# Patient Record
Sex: Male | Born: 1950
Health system: Southern US, Community
[De-identification: ages and names within clinical notes are randomized; demographics above are authoritative.]

## PROBLEM LIST (undated history)

## (undated) DIAGNOSIS — J439 Emphysema, unspecified: Secondary | ICD-10-CM

## (undated) DIAGNOSIS — I110 Hypertensive heart disease with heart failure: Secondary | ICD-10-CM

## (undated) DIAGNOSIS — J189 Pneumonia, unspecified organism: Secondary | ICD-10-CM

## (undated) DIAGNOSIS — J383 Other diseases of vocal cords: Secondary | ICD-10-CM

## (undated) DIAGNOSIS — I639 Cerebral infarction, unspecified: Secondary | ICD-10-CM

## (undated) DIAGNOSIS — M1990 Unspecified osteoarthritis, unspecified site: Secondary | ICD-10-CM

## (undated) DIAGNOSIS — R0902 Hypoxemia: Secondary | ICD-10-CM

## (undated) DIAGNOSIS — C140 Malignant neoplasm of pharynx, unspecified: Secondary | ICD-10-CM

## (undated) DIAGNOSIS — G4730 Sleep apnea, unspecified: Secondary | ICD-10-CM

## (undated) DIAGNOSIS — C4359 Malignant melanoma of other part of trunk: Secondary | ICD-10-CM

## (undated) DIAGNOSIS — K219 Gastro-esophageal reflux disease without esophagitis: Secondary | ICD-10-CM

## (undated) DIAGNOSIS — E785 Hyperlipidemia, unspecified: Secondary | ICD-10-CM

## (undated) DIAGNOSIS — F329 Major depressive disorder, single episode, unspecified: Secondary | ICD-10-CM

## (undated) DIAGNOSIS — I739 Peripheral vascular disease, unspecified: Secondary | ICD-10-CM

## (undated) DIAGNOSIS — I509 Heart failure, unspecified: Secondary | ICD-10-CM

## (undated) DIAGNOSIS — C4491 Basal cell carcinoma of skin, unspecified: Secondary | ICD-10-CM

## (undated) DIAGNOSIS — T7840XA Allergy, unspecified, initial encounter: Secondary | ICD-10-CM

## (undated) DIAGNOSIS — N189 Chronic kidney disease, unspecified: Secondary | ICD-10-CM

## (undated) DIAGNOSIS — I2510 Atherosclerotic heart disease of native coronary artery without angina pectoris: Secondary | ICD-10-CM

## (undated) DIAGNOSIS — Z973 Presence of spectacles and contact lenses: Secondary | ICD-10-CM

## (undated) DIAGNOSIS — F32A Depression, unspecified: Secondary | ICD-10-CM

## (undated) DIAGNOSIS — I219 Acute myocardial infarction, unspecified: Secondary | ICD-10-CM

## (undated) DIAGNOSIS — C801 Malignant (primary) neoplasm, unspecified: Secondary | ICD-10-CM

## (undated) DIAGNOSIS — I10 Essential (primary) hypertension: Secondary | ICD-10-CM

## (undated) DIAGNOSIS — J449 Chronic obstructive pulmonary disease, unspecified: Secondary | ICD-10-CM

## (undated) HISTORY — DX: Allergy, unspecified, initial encounter: T78.40XA

## (undated) HISTORY — DX: Hyperlipidemia, unspecified: E78.5

## (undated) HISTORY — PX: CHOLECYSTECTOMY: SHX55

## (undated) HISTORY — PX: BACK SURGERY: SHX140

## (undated) HISTORY — DX: Hypoxemia: R09.02

## (undated) HISTORY — DX: Hypertensive heart disease with heart failure: I11.0

## (undated) HISTORY — PX: CARDIAC CATHETERIZATION: SHX172

## (undated) HISTORY — DX: Depression, unspecified: F32.A

## (undated) HISTORY — DX: Malignant neoplasm of pharynx, unspecified: C14.0

## (undated) HISTORY — PX: APPENDECTOMY: SHX54

## (undated) HISTORY — DX: Cerebral infarction, unspecified: I63.9

## (undated) HISTORY — PX: ESOPHAGOGASTRODUODENOSCOPY: SHX1529

## (undated) HISTORY — DX: Heart failure, unspecified: I50.9

## (undated) HISTORY — DX: Emphysema, unspecified: J43.9

## (undated) HISTORY — PX: COLONOSCOPY: SHX174

## (undated) HISTORY — PX: KNEE SURGERY: SHX244

## (undated) HISTORY — DX: Sleep apnea, unspecified: G47.30

## (undated) HISTORY — DX: Essential (primary) hypertension: I10

## (undated) HISTORY — PX: OTHER SURGICAL HISTORY: SHX169

## (undated) HISTORY — DX: Chronic kidney disease, unspecified: N18.9

## (undated) HISTORY — DX: Malignant melanoma of other part of trunk: C43.59

## (undated) HISTORY — DX: Malignant (primary) neoplasm, unspecified: C80.1

## (undated) HISTORY — PX: SHOULDER SURGERY: SHX246

## (undated) HISTORY — PX: ELBOW SURGERY: SHX618

---
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
## 1993-11-13 DIAGNOSIS — R69 Illness, unspecified: Secondary | ICD-10-CM | POA: Insufficient documentation

## 1993-11-13 HISTORY — DX: Illness, unspecified: R69

## 1995-11-14 HISTORY — PX: THROAT SURGERY: SHX803

## 1998-11-13 DIAGNOSIS — I219 Acute myocardial infarction, unspecified: Secondary | ICD-10-CM | POA: Insufficient documentation

## 1998-11-13 DIAGNOSIS — I259 Chronic ischemic heart disease, unspecified: Secondary | ICD-10-CM | POA: Insufficient documentation

## 1998-11-13 HISTORY — DX: Chronic ischemic heart disease, unspecified: I25.9

## 1998-11-13 HISTORY — DX: Acute myocardial infarction, unspecified: I21.9

## 1998-11-22 ENCOUNTER — Other Ambulatory Visit: Admission: RE | Admit: 1998-11-22 | Discharge: 1998-11-22 | Payer: Self-pay | Admitting: *Deleted

## 1999-06-29 ENCOUNTER — Encounter: Payer: Self-pay | Admitting: Internal Medicine

## 1999-06-29 ENCOUNTER — Inpatient Hospital Stay (HOSPITAL_COMMUNITY): Admission: EM | Admit: 1999-06-29 | Discharge: 1999-07-01 | Payer: Self-pay | Admitting: Internal Medicine

## 1999-07-06 ENCOUNTER — Inpatient Hospital Stay (HOSPITAL_COMMUNITY): Admission: EM | Admit: 1999-07-06 | Discharge: 1999-07-08 | Payer: Self-pay | Admitting: Cardiology

## 2007-05-31 ENCOUNTER — Inpatient Hospital Stay (HOSPITAL_COMMUNITY): Admission: RE | Admit: 2007-05-31 | Discharge: 2007-06-01 | Payer: Self-pay | Admitting: Neurosurgery

## 2009-04-06 ENCOUNTER — Inpatient Hospital Stay (HOSPITAL_COMMUNITY): Admission: RE | Admit: 2009-04-06 | Discharge: 2009-04-07 | Payer: Self-pay | Admitting: Neurosurgery

## 2011-02-21 LAB — CBC
HCT: 43.2 % (ref 39.0–52.0)
Hemoglobin: 15 g/dL (ref 13.0–17.0)
MCHC: 34.8 g/dL (ref 30.0–36.0)
MCV: 90.5 fL (ref 78.0–100.0)
Platelets: 195 10*3/uL (ref 150–400)
RBC: 4.77 MIL/uL (ref 4.22–5.81)
RDW: 12.8 % (ref 11.5–15.5)
WBC: 11.6 10*3/uL — ABNORMAL HIGH (ref 4.0–10.5)

## 2011-02-21 LAB — BASIC METABOLIC PANEL
BUN: 11 mg/dL (ref 6–23)
CO2: 31 mEq/L (ref 19–32)
Calcium: 8.8 mg/dL (ref 8.4–10.5)
Chloride: 104 mEq/L (ref 96–112)
Creatinine, Ser: 0.91 mg/dL (ref 0.4–1.5)
GFR calc Af Amer: >60 mL/min (ref >60)
GFR calc non Af Amer: >60 mL/min (ref >60)
Glucose, Bld: 136 mg/dL — ABNORMAL HIGH (ref 70–99)
Potassium: 4.1 mEq/L (ref 3.5–5.1)
Sodium: 139 mEq/L (ref 135–145)

## 2011-03-28 NOTE — Op Note (Signed)
NAMESCHYLAR, WUEBKER            ACCOUNT NO.:  [id number]   MEDICAL RECORD NO.:  [id number]          PATIENT TYPE:  INP   LOCATION:  3172                         FACILITY:  [facility]   PHYSICIAN:  Hilda Lias, M.D.   DATE OF BIRTH:  Aug 27, 1951   DATE OF PROCEDURE:  04/06/2009  DATE OF DISCHARGE:                               OPERATIVE REPORT   PREOPERATIVE DIAGNOSIS:  C3-C4 spondylosis with acute and chronic C4  radiculopathy right incidental C6-C7 herniated disk.   POSTOPERATIVE DIAGNOSIS:  C3-C4 spondylosis with acute and chronic C4  radiculopathy right incidental C6-C7 herniated disk.   PROCEDURE:  Anterior C3-C4 diskectomy and decompression of spinal cord,  decompression of the C4 nerve root bilaterally, interbody fusion with  autograft and allograft plate, microscope.   SURGEON:  Hilda Lias, MD.   ASSISTANT:  Cristi Loron, MD.   CLINICAL HISTORY:  Mr. Aversa is a 60 year old gentleman complaining of  neck pain worsened to the right upper extremity.  He had failed with  conservative treatment.  X-rays showed that he has a herniated disk and  an osteophyte at the level of 3-4 to the right side and incidental 6-7  to the left side.  Surgery was advised in view of no improvement.  The  patient declined epidural injection.   PROCEDURE:  The patient was taken to the OR and after intubation, the  left side of the neck was cleaned with DuraPrep.  Transverse incision  through the skin, platysma was carried out.  X-rays showed that indeed  we were at the level of 2-3.  From thereon, we dropped one level and we  opened the ligament at the level of C3-C4.  The patient had quite a bit  of degenerative disk disease and using the drill as well as the  microcurette, we did a total __________ diskectomy.  We brought the  microscope into the area.  We opened the posterior ligament, which was  half calcified and using the 2 and 3-mm Kerrison punch as well as the  drill, we went  laterally into the C3-4 area.  The patient has a  combination of a herniated disk plus osteophyte.  Decompression of the  C4 nerve root on the right side was done.  Then decompression of the  spinal cord as well as the left C4 was achieved.  The endplate were  drilled and a graft of 7 mm with autograft and PBX was inserted.  This  was followed by a plate using 4 screws.  Lateral cervical spine showed  good position of the graft and the plate.  Once we achieved the  hemostasis, the area was irrigated.  Then the wound was closed with  Vicryl and Steri-Strip.           ______________________________  Hilda Lias, M.D.     EB/MEDQ  D:  04/06/2009  T:  04/07/2009  Job:  [id number]

## 2011-03-28 NOTE — H&P (Signed)
Don [name], Don [name]            ACCOUNT NO.:  [id number]   MEDICAL RECORD NO.:  [id number]          PATIENT TYPE:  INP   LOCATION:  3003                         FACILITY:  [facility]   PHYSICIAN:  Hilda Lias, M.D.   DATE OF BIRTH:  06/05/1951   DATE OF ADMISSION:  04/06/2009  DATE OF DISCHARGE:                              HISTORY & PHYSICAL   [name] is a gentleman who came to my office with his wife  complaining of back pain with radiation to both shoulders which gets  worse with movement of the neck.  The patient feels that the pain is  getting worse.  He had epidural conservative treatment.  He declined  epidural injection.  The patient had MRI and he was sent to [place], and he  decided to go ahead with the surgery.   PAST MEDICAL HISTORY:  Knee surgery, throat surgery.   He is allergic to PENICILLIN.   The patient does not drink.  He smokes half a pack a day.   REVIEW OF SYSTEMS:  Positive for high blood pressure, high cholesterol,  emphysema, back pain, MI.   PHYSICAL EXAMINATION:  GENERAL:  The patient came to my office and he  was quite miserable.  He kept his neck in a neutral position.  EARS, NOSE, AND THROAT: Normal.  NECK:  He is able to flex, but extension and lateralization causes  discomfort going to the shoulder.  CARDIOVASCULAR:  Unremarkable.  LUNGS:  There is mild rhonchi bilaterally.  ABDOMEN:  Unremarkable.  EXTREMITIES:  Unremarkable.  NEUROLOGIC:  He has decreased flexibility of the lumbar spine.  Sensation seemed to be normal.  There is some tenderness in the  trapezius muscle.  There is no weakness in the upper extremities.   The MRI showed that he has a herniated disk at the level of 6-7 to the  left.  There is another one at the level of C3-4 and goes to the right  side compromising the C4 nerve root.   CLINICAL IMPRESSION:  1. C3-C4 stenosis and spondylosis with radiculopathy.  2. Incidental 6-7 herniated disk.   RECOMMENDATIONS:  The pain  team want to proceed with surgery.  The  procedure will be anterior cervical diskectomy at the level of 3-4.  He  knows about the risks such as infection, difficulty swallowing, CSF  leak.  He is fully aware.  Although he has no complaint of pain in the  left upper extremity or in the triceps, there is a possibility of down  the line, his C6 level will get to the point that he will require  different type of surgery.           ______________________________  Hilda Lias, M.D.     EB/MEDQ  D:  04/06/2009  T:  04/07/2009  Job:  [id number]

## 2011-03-28 NOTE — Op Note (Signed)
NAMEONEY, FOLZ            ACCOUNT NO.:  [id number]   MEDICAL RECORD NO.:  [id number]          PATIENT TYPE:  INP   LOCATION:  3172                         FACILITY:  [facility]   PHYSICIAN:  Hilda Lias, M.D.   DATE OF BIRTH:  December 11, 1950   DATE OF PROCEDURE:  05/31/2007  DATE OF DISCHARGE:                               OPERATIVE REPORT   PREOPERATIVE DIAGNOSIS:  Lumbar stenosis with claudication, L4-5.   POSTOPERATIVE DIAGNOSIS:  Lumbar stenosis with claudication, L4-5.   PROCEDURE:  Bilateral L4-5 laminectomy decompression of the L4-L5-S1  nerve roots bilaterally.   SURGEON:  Hilda Lias, M.D.   ASSISTANT:  Payton Doughty, M.D.   INDICATIONS FOR SURGERY:  The patient was admitted because of back pain  worsening to both legs.  X-rays showed that he has stenosis at the level  of L4-5 with a bulging disk at the level of L5-S1.  The patient has  failed conservative treatment and surgery was advised.  The patient  knows about the risks such infection, CSF leak, need for further  surgery, and consent was obtained.   DESCRIPTION OF PROCEDURE:  The patient was taken to the operating room  and after intubation the skin was prepped with DuraPrep.  Drapes were  applied.  Midline incision from L3-4 down to L5-S1 was made and muscles  were retracted laterally.  X-rays showed that we were at the spinous  process of L4.  Then with the Stille rongeur or we removed the spinous  process of L4 and L5.  Using the drill with the lamina bilaterally.  We  found that the patient had quite a bit of stenosis, mostly central.  The  midline yellow ligament was also removed and we proceed with a  decompression all the way laterally until we were able to decompress the  thecal sac.   Then using the 213 mm Kerrison punch we did decompression to decompress  the L4-L5-S1 nerve root.  At the end we have good decompression.  The  disk at the level of L5 was bulging but indeed there was no need to do  any type of diskectomy.  From then on the area was irrigated.  X-rays  showed that indeed we were at the level upper and lower part of L3-4,  and down to L5-S1.  Fentanyl and Depo-Medrol were left in the epidural  space; and the wound was closed we with Vicryl and Steri-Strips.           ______________________________  Hilda Lias, M.D.     EB/MEDQ  D:  05/31/2007  T:  05/31/2007  Job:  [id number]

## 2011-08-28 LAB — CBC
Hemoglobin: 15.2
MCHC: 35
Platelets: 241
RDW: 13.4

## 2011-08-28 LAB — PROTIME-INR
INR: 1
Prothrombin Time: 12.8

## 2011-08-28 LAB — COMPREHENSIVE METABOLIC PANEL
ALT: 20
Calcium: 8.5
Glucose, Bld: 115 — ABNORMAL HIGH
Sodium: 132 — ABNORMAL LOW
Total Protein: 6.7

## 2014-12-18 ENCOUNTER — Other Ambulatory Visit: Payer: Self-pay | Admitting: *Deleted

## 2014-12-18 DIAGNOSIS — M79605 Pain in left leg: Secondary | ICD-10-CM

## 2015-01-07 ENCOUNTER — Encounter: Payer: Self-pay | Admitting: Surgery

## 2015-01-08 ENCOUNTER — Ambulatory Visit (INDEPENDENT_AMBULATORY_CARE_PROVIDER_SITE_OTHER): Payer: Commercial Managed Care - HMO | Admitting: Surgery

## 2015-01-08 ENCOUNTER — Ambulatory Visit (HOSPITAL_COMMUNITY)
Admission: RE | Admit: 2015-01-08 | Discharge: 2015-01-08 | Disposition: A | Discharge status: Home / Self Care | Payer: Commercial Managed Care - HMO | Source: Ambulatory Visit | Attending: Surgery | Admitting: Surgery

## 2015-01-08 ENCOUNTER — Encounter: Payer: Self-pay | Admitting: Surgery

## 2015-01-08 ENCOUNTER — Other Ambulatory Visit: Payer: Self-pay

## 2015-01-08 VITALS — BP 134/80 | HR 64 | Resp 16 | Ht 71.0 in | Wt 193.0 lb

## 2015-01-08 DIAGNOSIS — F17200 Nicotine dependence, unspecified, uncomplicated: Secondary | ICD-10-CM | POA: Diagnosis not present

## 2015-01-08 DIAGNOSIS — E785 Hyperlipidemia, unspecified: Secondary | ICD-10-CM | POA: Diagnosis not present

## 2015-01-08 DIAGNOSIS — I10 Essential (primary) hypertension: Secondary | ICD-10-CM | POA: Insufficient documentation

## 2015-01-08 DIAGNOSIS — M79605 Pain in left leg: Secondary | ICD-10-CM | POA: Insufficient documentation

## 2015-01-08 DIAGNOSIS — I70219 Atherosclerosis of native arteries of extremities with intermittent claudication, unspecified extremity: Secondary | ICD-10-CM

## 2015-01-08 NOTE — Progress Notes (Signed)
Patient name: Don [name] MRN: [id number] DOB: 1951/03/17 Sex: male   Referred by: Dr. Laqueta Due  Reason for referral:  Chief Complaint  Patient presents with  . New Evaluation    decreased pulse in left foot, leg pain and coldness    HISTORY OF PRESENT ILLNESS: This is a 64 year old gentleman who comes in today for pain in his hip with walking.  The patient reports having had a left iliac stent in [place] 10 years ago.  He has had progressive difficulty walking, causing pain in his hip.  He can now go approximately 15-50 feet before he has to stop.  Rest improves his symptoms.  He also complains of his leg being cold.  The patient suffers from coronary artery disease.  He is status post MI which was treated with stents in 2000.  He now sees a cardiologist in [place], Dr. Bettina Gavia.  His hypercholesterolemia is treated with a statin.  He continues to smoke.  He does have a history of throat cancer  Past Medical History  Diagnosis Date  . Hyperlipidemia   . Hypertension   . Cancer     throat  . Melanoma of back     melanoma on back    Past Surgical History  Procedure Laterality Date  . Elbow surgery      bilateral  . Shoulder surgery    . Appendectomy    . Cholecystectomy    . Back surgery    . Throat surgery    . Knee surgery Left     History   Social History  . Marital Status: Married    Spouse Name: N/A  . Number of Children: N/A  . Years of Education: N/A   Occupational History  . Not on file.   Social History Main Topics  . Smoking status: Current Every Day Smoker -- 40 years    Types: Cigarettes  . Smokeless tobacco: Never Used  . Alcohol Use: No  . Drug Use: No  . Sexual Activity: Not on file   Other Topics Concern  . Not on file   Social History Narrative  . No narrative on file    Family History  Problem Relation Age of Onset  . Cancer Mother     bone  . Stroke Father     Allergies as of 01/08/2015 - Review Complete 01/08/2015    Allergen Reaction Noted  . Penicillins Rash 01/08/2015    No current outpatient prescriptions on file prior to visit.   No current facility-administered medications on file prior to visit.     REVIEW OF SYSTEMS: Cardiovascular: Positive for pain in legs on walking and lying flat.  History of DVT.  History of swelling.  Negative for chest pain Pulmonary: A 4 productive cough, no  asthma or wheezing. Neurologic: No weakness, paresthesias, aphasia, or amaurosis. No dizziness. Hematologic: No bleeding problems or clotting disorders. Musculoskeletal: No joint pain or joint swelling. Gastrointestinal: No blood in stool or hematemesis Genitourinary: No dysuria or hematuria. Psychiatric:: No history of major depression. Integumentary: No rashes or ulcers. Constitutional: No fever or chills.  PHYSICAL EXAMINATION: General: The patient appears their stated age.  Vital signs are BP 134/80 mmHg  Pulse 64  Resp 16  Ht 5\' 11"  (1.803 m)  Wt 193 lb (87.544 kg)  BMI 26.93 kg/m2 HEENT:  No gross abnormalities Pulmonary: Respirations are non-labored Abdomen: Soft and non-tender aorta is difficult to palpate  Musculoskeletal: There are no major deformities.  Neurologic: No focal weakness or paresthesias are detected, Skin: There are no ulcer or rashes noted. Psychiatric: The patient has normal affect. Cardiovascular: There is a regular rate and rhythm without significant murmur appreciated.  Palpable right dorsalis pedis pulse.  Nonpalpable left.  Palpable right femoral pulse, nonpalpable left femoral pulse.  No carotid bruits  Diagnostic Studies: ABIs were performed today.  1.15 with triphasic waveforms on the right.  0.67 on the left with monophasic waveforms    Assessment:  Atherosclerosis with claudication, left leg Plan: The patient's symptoms and vascular lab studies suggest that he has significant in-stent stenosis versus occlusion within his iliac stent which was placed 10 years  ago in [place].  The next step is to proceed with angiography via a right femoral approach.  I discussed proceeding with intervention if feasible.  He also understands that he may need surgery should his stent be occluded.  Risks and benefits were discussed with the patient and his wife and he wishes to proceed.  This is been scheduled for Tuesday, March 8.     Eldridge Abrahams, M.D. [facility] Office: [phone] Pager:  [phone]

## 2015-01-12 ENCOUNTER — Telehealth: Payer: Self-pay

## 2015-01-12 MED ORDER — SODIUM CHLORIDE 0.9 % IV SOLN
INTRAVENOUS | Status: DC
  Administered 2015-01-13: 11:00:00 via INTRAVENOUS

## 2015-01-12 NOTE — Telephone Encounter (Signed)
rec'd phone call from pt's wife.  Reported pt's symptoms have worsened since he saw Dr. Trula Slade on 2/26.  Reported the pt. is only able to walk a short distance, before the pain in his left leg starts.  Also said pt. Reported the pain doesn't ease up as quickly with stopping and resting.  Reported he also c/o pain at rest.  Describes pain as a "burning" sensation.  Notified Dr. Trula Slade of pt's worsening symptoms.  Recommended to move his Aortogram to 01/13/15.  Notified pt's wife of plan; agrees.  Instructed to have pt. report to the hospital at 10:00 AM on 3/2 ; scheduled for procedure at 12:00 PM.  Verb. Understanding.

## 2015-01-13 ENCOUNTER — Other Ambulatory Visit: Payer: Self-pay | Admitting: *Deleted

## 2015-01-13 ENCOUNTER — Encounter (HOSPITAL_COMMUNITY)
Admission: RE | Disposition: A | Discharge status: Home / Self Care | Payer: Self-pay | Source: Ambulatory Visit | Attending: Surgery

## 2015-01-13 ENCOUNTER — Encounter (HOSPITAL_COMMUNITY): Payer: Self-pay | Admitting: Vascular Surgery

## 2015-01-13 ENCOUNTER — Ambulatory Visit (HOSPITAL_COMMUNITY)
Admission: RE | Admit: 2015-01-13 | Discharge: 2015-01-13 | Disposition: A | Discharge status: Home / Self Care | Payer: Commercial Managed Care - HMO | Source: Ambulatory Visit | Attending: Surgery | Admitting: Surgery

## 2015-01-13 DIAGNOSIS — I252 Old myocardial infarction: Secondary | ICD-10-CM | POA: Insufficient documentation

## 2015-01-13 DIAGNOSIS — E780 Pure hypercholesterolemia: Secondary | ICD-10-CM | POA: Insufficient documentation

## 2015-01-13 DIAGNOSIS — E785 Hyperlipidemia, unspecified: Secondary | ICD-10-CM | POA: Insufficient documentation

## 2015-01-13 DIAGNOSIS — Z955 Presence of coronary angioplasty implant and graft: Secondary | ICD-10-CM | POA: Insufficient documentation

## 2015-01-13 DIAGNOSIS — I2510 Atherosclerotic heart disease of native coronary artery without angina pectoris: Secondary | ICD-10-CM | POA: Diagnosis not present

## 2015-01-13 DIAGNOSIS — F17210 Nicotine dependence, cigarettes, uncomplicated: Secondary | ICD-10-CM | POA: Diagnosis not present

## 2015-01-13 DIAGNOSIS — I739 Peripheral vascular disease, unspecified: Secondary | ICD-10-CM | POA: Diagnosis present

## 2015-01-13 DIAGNOSIS — I70212 Atherosclerosis of native arteries of extremities with intermittent claudication, left leg: Secondary | ICD-10-CM | POA: Diagnosis not present

## 2015-01-13 DIAGNOSIS — I10 Essential (primary) hypertension: Secondary | ICD-10-CM | POA: Insufficient documentation

## 2015-01-13 DIAGNOSIS — Z85818 Personal history of malignant neoplasm of other sites of lip, oral cavity, and pharynx: Secondary | ICD-10-CM | POA: Diagnosis not present

## 2015-01-13 DIAGNOSIS — Z9889 Other specified postprocedural states: Secondary | ICD-10-CM

## 2015-01-13 DIAGNOSIS — Z85820 Personal history of malignant melanoma of skin: Secondary | ICD-10-CM | POA: Diagnosis not present

## 2015-01-13 DIAGNOSIS — Z79899 Other long term (current) drug therapy: Secondary | ICD-10-CM | POA: Insufficient documentation

## 2015-01-13 HISTORY — PX: ABDOMINAL ANGIOGRAM: SHX5499

## 2015-01-13 LAB — POCT I-STAT, CHEM 8
BUN: 20 mg/dL (ref 6–23)
CHLORIDE: 102 mmol/L (ref 96–112)
CREATININE: 1 mg/dL (ref 0.50–1.35)
Calcium, Ion: 1.23 mmol/L (ref 1.13–1.30)
GLUCOSE: 122 mg/dL — AB (ref 70–99)
HEMATOCRIT: 48 % (ref 39.0–52.0)
Hemoglobin: 16.3 g/dL (ref 13.0–17.0)
POTASSIUM: 4.4 mmol/L (ref 3.5–5.1)
SODIUM: 142 mmol/L (ref 135–145)
TCO2: 26 mmol/L (ref 0–100)

## 2015-01-13 LAB — POCT ACTIVATED CLOTTING TIME
Activated Clotting Time: 202 seconds
Activated Clotting Time: 171 seconds

## 2015-01-13 SURGERY — ABDOMINAL ANGIOGRAM
Anesthesia: LOCAL

## 2015-01-13 MED ORDER — CLOPIDOGREL BISULFATE 75 MG PO TABS: 75.0000 mg | ORAL_TABLET | Freq: Every day | ORAL | Status: DC

## 2015-01-13 MED ORDER — SODIUM CHLORIDE 0.9 % IV SOLN: 1.0000 mL/kg/h | INTRAVENOUS | Status: DC

## 2015-01-13 MED ORDER — HEPARIN (PORCINE) IN NACL 2-0.9 UNIT/ML-% IJ SOLN
INTRAMUSCULAR | Status: AC
  Filled 2015-01-13: qty 1000

## 2015-01-13 MED ORDER — CLOPIDOGREL BISULFATE 75 MG PO TABS
ORAL_TABLET | ORAL | Status: AC
  Administered 2015-01-13: 75 mg via ORAL
  Filled 2015-01-13: qty 1

## 2015-01-13 MED ORDER — FENTANYL CITRATE 0.05 MG/ML IJ SOLN
INTRAMUSCULAR | Status: AC
  Filled 2015-01-13: qty 2

## 2015-01-13 MED ORDER — OXYCODONE-ACETAMINOPHEN 5-325 MG PO TABS: 1.0000 | ORAL_TABLET | ORAL | Status: DC | PRN

## 2015-01-13 MED ORDER — MIDAZOLAM HCL 2 MG/2ML IJ SOLN
INTRAMUSCULAR | Status: AC
  Filled 2015-01-13: qty 2

## 2015-01-13 MED ORDER — CLOPIDOGREL BISULFATE 75 MG PO TABS
75.0000 mg | ORAL_TABLET | Freq: Once | ORAL | Status: AC
  Administered 2015-01-13: 75 mg via ORAL

## 2015-01-13 MED ORDER — LIDOCAINE HCL (PF) 1 % IJ SOLN
INTRAMUSCULAR | Status: AC
Start: 2015-01-13 — End: 2015-01-13
  Filled 2015-01-13: qty 30

## 2015-01-13 NOTE — Progress Notes (Signed)
Site area: rt groin Site Prior to Removal:  Level 0 Pressure Applied For: 20 minutes Manual:   yes Patient Status During Pull:  yes Post Pull Site:  Level 0 Post Pull Instructions Given:  yes Post Pull Pulses Present: yes Dressing Applied:  tegaderm Bedrest begins @  1510 Comments: 0 complications

## 2015-01-13 NOTE — Op Note (Signed)
    OPERATIVE REPORT  DATE OF SURGERY: 01/13/2015  PATIENT: Don [name], 64 y.o. male MRN: [id number]  DOB: 1951-02-27  PRE-OPERATIVE DIAGNOSIS: Left leg claudication  POST-OPERATIVE DIAGNOSIS:  Same  PROCEDURE:1.Aortogram with bilateral lower extremity runoff, 2. Left external iliac angioplasty and 7 x 30 Abbott  absolutes self-expanding stent  SURGEON:  Curt Jews, M.D.  PHYSICIAN ASSISTANT: Nurse  ANESTHESIA:  1% lidocaine local with IV sedation  EBL: Minimal ml     BLOOD ADMINISTERED: None  DRAINS: None  SPECIMEN: None  COUNTS CORRECT:  YES  PLAN OF CARE: PACU   PATIENT DISPOSITION:  PACU - hemodynamically stable  PROCEDURE DETAILS: Patient was taken for this Cath Lab placed supine position where the area of the left groins prepped draped in sterile fashion. Using local anesthesia and SonoSite visualization with singlewall puncture the right common femoral artery was entered and a guidewire was passed centrally and this was confirmed with fluoroscopy. A 5 French sheath was passed over the guidewire and a pigtail catheter was positioned above the level renal arteries. AP projections undertaken showing the aorta pelvis. Catheter was then withdrawn down to the just above the aortic bifurcation and long-leg runoff was obtained.  The patient had single right and 2 left renal arteries with no significant stenosis. There was diffuse irregularity throughout the aortoiliac segments. The patient had a prior placed left common iliac artery stent that was from just below the bifurcation probably extending into the external iliac artery proximally. The internal iliac artery was occluded on the left was patent on the right. Long-leg runoff revealed widely patent profunda and superficial femoral arteries bilaterally. Popliteal arteries and three-vessel runoff was normal bilaterally. The patient did have a high-grade stenosis which is quite focal just below the end of the prior placed  left iliac artery stent. Decision was made to intervene on this as the cause for his limiting claudication.  Patient was given 5000 units intravenous heparin. A KMP catheter was used to cross over the aortic bifurcation from the right and a guidewire passed into the prior placed stent. KMP catheter was positioned in the prior placed stent and the guidewire passed across this tight lesion. The 5 French sheath was removed and a long crossover sheath was placed over the guidewire and positioned in the prior placed iliac stent. Hand injection again revealed the location of the high-grade stenosis. A 7 x 30 Abbott absolute stent was brought onto the field and was passed over the guidewire. This was released at the level of the high-grade stenosis. The proximal portion of the stent overlapped the old Pre-existing stent. The stent was post dilatated with 7 x 2 balloon. The balloon was removed and repeat hand injection showed excellent result with no residual stenosis. The sheath was pulled back into the right external iliac artery. He was transferred to holding area where sheath was being pulled after ACT normalized.  #1 high-grade stenosis of external iliac artery on the left below prior placed left common iliac artery stent.  #2 widely patent superficial and deep femoral arteries bilaterally with Y patent popliteal and three-vessel runoff #3 successful self-expanding stent and angioplasty of left external iliac artery stenosis   Curt Jews, M.D. 01/13/2015 2:04 PM

## 2015-01-13 NOTE — H&P (View-Only) (Signed)
Patient name: Don [name] MRN: [id number] DOB: 05/16/1951 Sex: male   Referred by: Dr. Laqueta Due  Reason for referral:  Chief Complaint  Patient presents with  . New Evaluation    decreased pulse in left foot, leg pain and coldness    HISTORY OF PRESENT ILLNESS: This is a 64 year old gentleman who comes in today for pain in his hip with walking.  The patient reports having had a left iliac stent in [place] 10 years ago.  He has had progressive difficulty walking, causing pain in his hip.  He can now go approximately 15-50 feet before he has to stop.  Rest improves his symptoms.  He also complains of his leg being cold.  The patient suffers from coronary artery disease.  He is status post MI which was treated with stents in 2000.  He now sees a cardiologist in [place], Dr. Bettina Gavia.  His hypercholesterolemia is treated with a statin.  He continues to smoke.  He does have a history of throat cancer  Past Medical History  Diagnosis Date  . Hyperlipidemia   . Hypertension   . Cancer     throat  . Melanoma of back     melanoma on back    Past Surgical History  Procedure Laterality Date  . Elbow surgery      bilateral  . Shoulder surgery    . Appendectomy    . Cholecystectomy    . Back surgery    . Throat surgery    . Knee surgery Left     History   Social History  . Marital Status: Married    Spouse Name: N/A  . Number of Children: N/A  . Years of Education: N/A   Occupational History  . Not on file.   Social History Main Topics  . Smoking status: Current Every Day Smoker -- 40 years    Types: Cigarettes  . Smokeless tobacco: Never Used  . Alcohol Use: No  . Drug Use: No  . Sexual Activity: Not on file   Other Topics Concern  . Not on file   Social History Narrative  . No narrative on file    Family History  Problem Relation Age of Onset  . Cancer Mother     bone  . Stroke Father     Allergies as of 01/08/2015 - Review Complete 01/08/2015    Allergen Reaction Noted  . Penicillins Rash 01/08/2015    No current outpatient prescriptions on file prior to visit.   No current facility-administered medications on file prior to visit.     REVIEW OF SYSTEMS: Cardiovascular: Positive for pain in legs on walking and lying flat.  History of DVT.  History of swelling.  Negative for chest pain Pulmonary: A 4 productive cough, no  asthma or wheezing. Neurologic: No weakness, paresthesias, aphasia, or amaurosis. No dizziness. Hematologic: No bleeding problems or clotting disorders. Musculoskeletal: No joint pain or joint swelling. Gastrointestinal: No blood in stool or hematemesis Genitourinary: No dysuria or hematuria. Psychiatric:: No history of major depression. Integumentary: No rashes or ulcers. Constitutional: No fever or chills.  PHYSICAL EXAMINATION: General: The patient appears their stated age.  Vital signs are BP 134/80 mmHg  Pulse 64  Resp 16  Ht 5\' 11"  (1.803 m)  Wt 193 lb (87.544 kg)  BMI 26.93 kg/m2 HEENT:  No gross abnormalities Pulmonary: Respirations are non-labored Abdomen: Soft and non-tender aorta is difficult to palpate  Musculoskeletal: There are no major deformities.  Neurologic: No focal weakness or paresthesias are detected, Skin: There are no ulcer or rashes noted. Psychiatric: The patient has normal affect. Cardiovascular: There is a regular rate and rhythm without significant murmur appreciated.  Palpable right dorsalis pedis pulse.  Nonpalpable left.  Palpable right femoral pulse, nonpalpable left femoral pulse.  No carotid bruits  Diagnostic Studies: ABIs were performed today.  1.15 with triphasic waveforms on the right.  0.67 on the left with monophasic waveforms    Assessment:  Atherosclerosis with claudication, left leg Plan: The patient's symptoms and vascular lab studies suggest that he has significant in-stent stenosis versus occlusion within his iliac stent which was placed 10 years  ago in [place].  The next step is to proceed with angiography via a right femoral approach.  I discussed proceeding with intervention if feasible.  He also understands that he may need surgery should his stent be occluded.  Risks and benefits were discussed with the patient and his wife and he wishes to proceed.  This is been scheduled for Tuesday, March 8.     Eldridge Abrahams, M.D. [facility] Office: [phone] Pager:  [phone]

## 2015-01-13 NOTE — Interval H&P Note (Signed)
History and Physical Interval Note:  01/13/2015 12:57 PM  Don [name]  has presented today for surgery, with the diagnosis of pvd  The various methods of treatment have been discussed with the patient and family. After consideration of risks, benefits and other options for treatment, the patient has consented to  Procedure(s): ABDOMINAL ANGIOGRAM (N/A) as a surgical intervention .  The patient's history has been reviewed, patient examined, no change in status, stable for surgery.  I have reviewed the patient's chart and labs.  Questions were answered to the patient's satisfaction.     EARLY, TODD

## 2015-01-13 NOTE — Discharge Instructions (Signed)

## 2015-01-18 ENCOUNTER — Telehealth: Payer: Self-pay

## 2015-01-18 NOTE — Telephone Encounter (Signed)
Phone call to pt's wife in follow-up to call placed on 3/5, through the answering service, for c/o generalized rash.  Wife stated the pt. was started on a steroid and benadryl after speaking to Dr. Trula Slade on Saturday, 3/5.   Reported the pt. states the itching is somewhat better, but generalized rash still present.  Stated she will contact office if pt's symptoms don't continue to improve.  Will give Dr. Trula Slade update.

## 2015-01-19 ENCOUNTER — Telehealth: Payer: Self-pay | Admitting: Surgery

## 2015-01-19 NOTE — Telephone Encounter (Addendum)
-----   Message from Mena Goes, RN sent at 01/13/2015  2:34 PM EST ----- Regarding: Schedule 2 Patients on this message.   ----- Message -----    From: Rosetta Posner, MD    Sent: 01/13/2015   2:10 PM      To: [facility] Charge Pool  Had aortogram with bilateral motion runoff, this was with SonoSite the access, left external iliac artery angioplasty and stenting. Needs to see Dr. Trula Slade and office follow-up with ankle arm indices in one month  01/19/15: phone # is not valid, mailed letter, dpm

## 2015-02-12 DIAGNOSIS — J189 Pneumonia, unspecified organism: Secondary | ICD-10-CM

## 2015-02-12 HISTORY — DX: Pneumonia, unspecified organism: J18.9

## 2015-03-01 ENCOUNTER — Encounter (HOSPITAL_COMMUNITY): Payer: Commercial Managed Care - HMO

## 2015-03-01 ENCOUNTER — Encounter: Payer: Commercial Managed Care - HMO | Admitting: Surgery

## 2015-03-15 ENCOUNTER — Other Ambulatory Visit: Payer: Self-pay | Admitting: Otolaryngology

## 2015-03-15 ENCOUNTER — Encounter (HOSPITAL_COMMUNITY): Payer: Self-pay | Admitting: *Deleted

## 2015-03-15 NOTE — Progress Notes (Signed)
No pre-op orders in EPIC. Called Dr. Janace Hoard' office to request orders, spoke with [facility] and she states she will get them in.

## 2015-03-15 NOTE — Progress Notes (Signed)
Anesthesia Chart Review: SAME DAY WORK-UP.  Patient is a 64 year old male scheduled for microlaryngoscopy with laser tomorrow by Dr. Janace Hoard.  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  History includes smoking, CAD/MI s/p stents '00, HLD, HTN, PVD s/p left CIA stent ~ 2006 ([place]) and s/p angioplasty/stent left EIA 01/13/15, melanoma (back), throat cancer s/p surgery, lumbar laminectomy '08, C4-5 ACDF '10.   PCP is listed as Dr. Ernestene Kiel. Vascular surgeon is Dr. Trula Slade. Cardiologist is Dr. Shirlee More, in [place], last visit 06/24/14 for routine follow-up and preoperative clearance (surgery isn't listed but he was felt overall low CV risk for an "intermediate risk" procedure. PAT RN said wife reported it was surgery for Peyronie's disease.) Patient was able to exercise > 6 METS at that time. His not also indicates that patient has secondary cardiomyopathy with EF 45% and inferior hypokinesis '12.   Meds include Xanax, ASA, Plavix, Flexeril, Cymbalta, Flovent, Lopressor, Prilosec, Zocor, Desyrel. Per PAT RN, patient's wife reported he was told to hold ASA and Plavix for ~ 1 1/2 weeks prior to surgery. (Since he had a EIA stent placed 01/2015, I notified [facility] RN Colletta Maryland of patient's planned procedure and that Plavix/ASA on hold. Dr. Trula Slade felt it was okay to hold 5-7 days; however I notified her that patient has actually been off for ~ 10 days.)  Of note, he just completed a two week course of doxycycline for "pneumonia" diagnosed at [facility]. Patient told his PAT RN that he is afebrile with only his usual COPD symptoms/cough.   01/13/15 EKG: NSR, LAFB, pulmonary disease pattern, incomplete RBBB. No significant change since tracing in [place] from 05/31/07.  06/24/14 Echo [facility]; done as part of his pre-operative assessment for a previous surgery):  1. Mild diffuse LV hypokinesis with borderline LVEF of 50%, no segmental abnormality. 2. Trace/mild MR, probably secondary to #1, mil  mild LA dilatation. 3. Grossly normal right heart size/function, with only trace TR. 4. Incidentally noted, frequent PVCs.   By notes, "2012 coronary arteriography showed chronic occlusion of the right coronary artery, mildly diminished ejection fraction, no other significant coronary stenosis. He has been treated medically."   Records from [facility] (cardiac cath) and [facility] (CXR) requested but are still pending.  Patient recently completed treatment for "pneumonia." Records are pending.  Reportedly, he is overall back to his baseline.  Since he is a same day work-up, I can not assess evaluate his lung sounds, so this will need to be done on arrival by his anesthesiologists.  Will also need to confirm that he has had no new CV symptoms.  His last cardiology office note was reassuring--EF was stable and good exercise tolerance.  If cardiopulmonary exam is felt stable by his anesthesiologist on the day of surgery then I anticipate that he can proceed as planned. If significant abnormal breaths sounds then it is possible that procedure would need to be delayed. Discussed with anesthesiologist Dr. Lissa Hoard who agrees with this plan. I updated Glenda at Dr. Peggyann Juba office. She is faxing a medical clearance note from Dr. Laqueta Due that was signed and dated today. Further evaluation on arrival.  George Hugh [facility] Short Stay Center/Anesthesiology Phone [phone] 03/15/2015 2:25 PM

## 2015-03-15 NOTE — Progress Notes (Signed)
Spoke with pt's wife, Anne Ng for pre-op call. She states pt has been treated for pneumonia the past 2 weeks. States he does not have a fever and his cough is his usual COPD cough.  She states pt has stopped his Plavix and Aspirin 1.5 weeks ago.   Called Dr. Joya Gaskins office to get copies of last OV, EKG and any other heart studies.   Requested EKG and cath report from [place] from [facility].

## 2015-03-16 ENCOUNTER — Ambulatory Visit (HOSPITAL_COMMUNITY): Payer: Commercial Managed Care - HMO | Admitting: Vascular Surgery

## 2015-03-16 ENCOUNTER — Ambulatory Visit (HOSPITAL_COMMUNITY)
Admission: RE | Admit: 2015-03-16 | Discharge: 2015-03-16 | Disposition: A | Discharge status: Home / Self Care | Payer: Commercial Managed Care - HMO | Source: Ambulatory Visit | Attending: Otolaryngology | Admitting: Otolaryngology

## 2015-03-16 ENCOUNTER — Encounter (HOSPITAL_COMMUNITY)
Admission: RE | Disposition: A | Discharge status: Home / Self Care | Payer: Self-pay | Source: Ambulatory Visit | Attending: Otolaryngology

## 2015-03-16 ENCOUNTER — Encounter (HOSPITAL_COMMUNITY): Payer: Self-pay | Admitting: *Deleted

## 2015-03-16 DIAGNOSIS — M1990 Unspecified osteoarthritis, unspecified site: Secondary | ICD-10-CM | POA: Insufficient documentation

## 2015-03-16 DIAGNOSIS — J383 Other diseases of vocal cords: Secondary | ICD-10-CM | POA: Insufficient documentation

## 2015-03-16 DIAGNOSIS — I739 Peripheral vascular disease, unspecified: Secondary | ICD-10-CM | POA: Diagnosis not present

## 2015-03-16 DIAGNOSIS — K219 Gastro-esophageal reflux disease without esophagitis: Secondary | ICD-10-CM | POA: Insufficient documentation

## 2015-03-16 DIAGNOSIS — Z8701 Personal history of pneumonia (recurrent): Secondary | ICD-10-CM | POA: Insufficient documentation

## 2015-03-16 DIAGNOSIS — I252 Old myocardial infarction: Secondary | ICD-10-CM | POA: Diagnosis not present

## 2015-03-16 DIAGNOSIS — Z79899 Other long term (current) drug therapy: Secondary | ICD-10-CM | POA: Diagnosis not present

## 2015-03-16 DIAGNOSIS — Z91041 Radiographic dye allergy status: Secondary | ICD-10-CM | POA: Diagnosis not present

## 2015-03-16 DIAGNOSIS — Z85818 Personal history of malignant neoplasm of other sites of lip, oral cavity, and pharynx: Secondary | ICD-10-CM | POA: Insufficient documentation

## 2015-03-16 DIAGNOSIS — Z880 Allergy status to penicillin: Secondary | ICD-10-CM | POA: Insufficient documentation

## 2015-03-16 DIAGNOSIS — F17210 Nicotine dependence, cigarettes, uncomplicated: Secondary | ICD-10-CM | POA: Diagnosis not present

## 2015-03-16 DIAGNOSIS — Z955 Presence of coronary angioplasty implant and graft: Secondary | ICD-10-CM | POA: Diagnosis not present

## 2015-03-16 DIAGNOSIS — I10 Essential (primary) hypertension: Secondary | ICD-10-CM | POA: Insufficient documentation

## 2015-03-16 DIAGNOSIS — E785 Hyperlipidemia, unspecified: Secondary | ICD-10-CM | POA: Diagnosis not present

## 2015-03-16 DIAGNOSIS — J449 Chronic obstructive pulmonary disease, unspecified: Secondary | ICD-10-CM | POA: Insufficient documentation

## 2015-03-16 DIAGNOSIS — Z9049 Acquired absence of other specified parts of digestive tract: Secondary | ICD-10-CM | POA: Insufficient documentation

## 2015-03-16 DIAGNOSIS — D141 Benign neoplasm of larynx: Secondary | ICD-10-CM | POA: Diagnosis not present

## 2015-03-16 DIAGNOSIS — Z7902 Long term (current) use of antithrombotics/antiplatelets: Secondary | ICD-10-CM | POA: Diagnosis not present

## 2015-03-16 DIAGNOSIS — Z85828 Personal history of other malignant neoplasm of skin: Secondary | ICD-10-CM | POA: Insufficient documentation

## 2015-03-16 DIAGNOSIS — Z7982 Long term (current) use of aspirin: Secondary | ICD-10-CM | POA: Diagnosis not present

## 2015-03-16 DIAGNOSIS — I2510 Atherosclerotic heart disease of native coronary artery without angina pectoris: Secondary | ICD-10-CM | POA: Diagnosis not present

## 2015-03-16 HISTORY — DX: Chronic obstructive pulmonary disease, unspecified: J44.9

## 2015-03-16 HISTORY — DX: Acute myocardial infarction, unspecified: I21.9

## 2015-03-16 HISTORY — DX: Peripheral vascular disease, unspecified: I73.9

## 2015-03-16 HISTORY — DX: Gastro-esophageal reflux disease without esophagitis: K21.9

## 2015-03-16 HISTORY — DX: Atherosclerotic heart disease of native coronary artery without angina pectoris: I25.10

## 2015-03-16 HISTORY — DX: Basal cell carcinoma of skin, unspecified: C44.91

## 2015-03-16 HISTORY — DX: Unspecified osteoarthritis, unspecified site: M19.90

## 2015-03-16 HISTORY — DX: Pneumonia, unspecified organism: J18.9

## 2015-03-16 HISTORY — PX: MICROLARYNGOSCOPY WITH LASER: SHX5972

## 2015-03-16 LAB — BASIC METABOLIC PANEL
Anion gap: 9 (ref 5–15)
BUN: 17 mg/dL (ref 6–20)
CALCIUM: 9 mg/dL (ref 8.9–10.3)
CO2: 28 mmol/L (ref 22–32)
Chloride: 102 mmol/L (ref 101–111)
Creatinine, Ser: 0.98 mg/dL (ref 0.61–1.24)
GFR calc Af Amer: >60 mL/min (ref >60)
GLUCOSE: 126 mg/dL — AB (ref 70–99)
Potassium: 4 mmol/L (ref 3.5–5.1)
Sodium: 139 mmol/L (ref 135–145)

## 2015-03-16 LAB — CBC
HCT: 41 % (ref 39.0–52.0)
HEMOGLOBIN: 14.1 g/dL (ref 13.0–17.0)
MCH: 30.7 pg (ref 26.0–34.0)
MCHC: 34.4 g/dL (ref 30.0–36.0)
MCV: 89.3 fL (ref 78.0–100.0)
Platelets: 174 10*3/uL (ref 150–400)
RBC: 4.59 MIL/uL (ref 4.22–5.81)
RDW: 13.2 % (ref 11.5–15.5)
WBC: 14.6 10*3/uL — ABNORMAL HIGH (ref 4.0–10.5)

## 2015-03-16 LAB — PROTIME-INR
INR: 1.05 (ref 0.00–1.49)
PROTHROMBIN TIME: 13.8 s (ref 11.6–15.2)

## 2015-03-16 SURGERY — MICROLARYNGOSCOPY, WITH PROCEDURE USING LASER
Anesthesia: General | Site: Mouth

## 2015-03-16 MED ORDER — ROCURONIUM BROMIDE 100 MG/10ML IV SOLN
INTRAVENOUS | Status: DC | PRN
  Administered 2015-03-16: 30 mg via INTRAVENOUS

## 2015-03-16 MED ORDER — OXYCODONE HCL 5 MG PO TABS: 5.0000 mg | ORAL_TABLET | Freq: Once | ORAL | Status: AC | PRN

## 2015-03-16 MED ORDER — ROCURONIUM BROMIDE 50 MG/5ML IV SOLN
INTRAVENOUS | Status: AC
  Filled 2015-03-16: qty 1

## 2015-03-16 MED ORDER — EPINEPHRINE HCL (NASAL) 0.1 % NA SOLN
NASAL | Status: DC | PRN
  Administered 2015-03-16: 30 mL via TOPICAL

## 2015-03-16 MED ORDER — EPINEPHRINE HCL (NASAL) 0.1 % NA SOLN
NASAL | Status: AC
  Filled 2015-03-16: qty 30

## 2015-03-16 MED ORDER — FENTANYL CITRATE (PF) 100 MCG/2ML IJ SOLN: 25.0000 ug | INTRAMUSCULAR | Status: DC | PRN

## 2015-03-16 MED ORDER — PROPOFOL 10 MG/ML IV BOLUS
INTRAVENOUS | Status: AC
  Filled 2015-03-16: qty 20

## 2015-03-16 MED ORDER — FENTANYL CITRATE (PF) 100 MCG/2ML IJ SOLN
INTRAMUSCULAR | Status: DC | PRN
  Administered 2015-03-16 (×2): 100 ug via INTRAVENOUS

## 2015-03-16 MED ORDER — 0.9 % SODIUM CHLORIDE (POUR BTL) OPTIME
TOPICAL | Status: DC | PRN
  Administered 2015-03-16: 1000 mL

## 2015-03-16 MED ORDER — DEXAMETHASONE SODIUM PHOSPHATE 4 MG/ML IJ SOLN
INTRAMUSCULAR | Status: DC | PRN
  Administered 2015-03-16: 4 mg via INTRAVENOUS

## 2015-03-16 MED ORDER — OXYCODONE HCL 5 MG/5ML PO SOLN: 5.0000 mg | Freq: Once | ORAL | Status: AC | PRN

## 2015-03-16 MED ORDER — ONDANSETRON HCL 4 MG/2ML IJ SOLN
INTRAMUSCULAR | Status: AC
  Filled 2015-03-16: qty 2

## 2015-03-16 MED ORDER — FENTANYL CITRATE (PF) 250 MCG/5ML IJ SOLN
INTRAMUSCULAR | Status: AC
  Filled 2015-03-16: qty 5

## 2015-03-16 MED ORDER — PROPOFOL 10 MG/ML IV BOLUS
INTRAVENOUS | Status: DC | PRN
  Administered 2015-03-16: 90 mg via INTRAVENOUS

## 2015-03-16 MED ORDER — SUCCINYLCHOLINE CHLORIDE 20 MG/ML IJ SOLN
INTRAMUSCULAR | Status: DC | PRN
  Administered 2015-03-16: 100 mg via INTRAVENOUS

## 2015-03-16 MED ORDER — MIDAZOLAM HCL 5 MG/5ML IJ SOLN
INTRAMUSCULAR | Status: DC | PRN
  Administered 2015-03-16: 2 mg via INTRAVENOUS

## 2015-03-16 MED ORDER — GLYCOPYRROLATE 0.2 MG/ML IJ SOLN
INTRAMUSCULAR | Status: DC | PRN
  Administered 2015-03-16: 0.6 mg via INTRAVENOUS

## 2015-03-16 MED ORDER — MIDAZOLAM HCL 2 MG/2ML IJ SOLN
INTRAMUSCULAR | Status: AC
  Filled 2015-03-16: qty 2

## 2015-03-16 MED ORDER — LIDOCAINE HCL (CARDIAC) 20 MG/ML IV SOLN
INTRAVENOUS | Status: AC
  Filled 2015-03-16: qty 5

## 2015-03-16 MED ORDER — ONDANSETRON HCL 4 MG/2ML IJ SOLN: 4.0000 mg | Freq: Four times a day (QID) | INTRAMUSCULAR | Status: DC | PRN

## 2015-03-16 MED ORDER — NEOSTIGMINE METHYLSULFATE 10 MG/10ML IV SOLN
INTRAVENOUS | Status: DC | PRN
  Administered 2015-03-16: 4 mg via INTRAVENOUS

## 2015-03-16 MED ORDER — LACTATED RINGERS IV SOLN
INTRAVENOUS | Status: DC
  Administered 2015-03-16 (×3): via INTRAVENOUS

## 2015-03-16 MED ORDER — LIDOCAINE HCL (CARDIAC) 20 MG/ML IV SOLN
INTRAVENOUS | Status: DC | PRN
  Administered 2015-03-16: 50 mg via INTRAVENOUS

## 2015-03-16 SURGICAL SUPPLY — 30 items
BANDAGE EYE OVAL (MISCELLANEOUS) ×4 IMPLANT
BLADE 10 SAFETY STRL DISP (BLADE) ×2 IMPLANT
BLADE SURG 15 STRL LF DISP TIS (BLADE) IMPLANT
BLADE SURG 15 STRL SS (BLADE)
CANISTER SUCTION 2500CC (MISCELLANEOUS) ×2 IMPLANT
CONT SPEC 4OZ CLIKSEAL STRL BL (MISCELLANEOUS) ×2 IMPLANT
COVER MAYO STAND STRL (DRAPES) ×2 IMPLANT
COVER TABLE BACK 60X90 (DRAPES) ×2 IMPLANT
CRADLE DONUT ADULT HEAD (MISCELLANEOUS) ×2 IMPLANT
DRAPE PROXIMA HALF (DRAPES) ×2 IMPLANT
GAUZE SPONGE 4X4 12PLY STRL (GAUZE/BANDAGES/DRESSINGS) ×2 IMPLANT
GLOVE BIO SURGEON STRL SZ7 (GLOVE) ×2 IMPLANT
GLOVE BIO SURGEON STRL SZ7.5 (GLOVE) ×2 IMPLANT
GLOVE BIOGEL PI IND STRL 7.0 (GLOVE) ×1 IMPLANT
GLOVE BIOGEL PI IND STRL 7.5 (GLOVE) ×1 IMPLANT
GLOVE BIOGEL PI INDICATOR 7.0 (GLOVE) ×1
GLOVE BIOGEL PI INDICATOR 7.5 (GLOVE) ×1
GLOVE SS BIOGEL STRL SZ 7.5 (GLOVE) ×1 IMPLANT
GLOVE SUPERSENSE BIOGEL SZ 7.5 (GLOVE) ×1
GUARD TEETH (MISCELLANEOUS) ×2 IMPLANT
KIT BASIN OR (CUSTOM PROCEDURE TRAY) ×2 IMPLANT
KIT ROOM TURNOVER OR (KITS) ×2 IMPLANT
NEEDLE HYPO 25GX1X1/2 BEV (NEEDLE) IMPLANT
NS IRRIG 1000ML POUR BTL (IV SOLUTION) ×2 IMPLANT
PAD ARMBOARD 7.5X6 YLW CONV (MISCELLANEOUS) ×4 IMPLANT
PATTIES SURGICAL .5 X1 (DISPOSABLE) ×2 IMPLANT
SPECIMEN JAR SMALL (MISCELLANEOUS) ×2 IMPLANT
TOWEL OR 17X24 6PK STRL BLUE (TOWEL DISPOSABLE) ×4 IMPLANT
TUBE CONNECTING 12X1/4 (SUCTIONS) ×2 IMPLANT
WATER STERILE IRR 1000ML POUR (IV SOLUTION) ×2 IMPLANT

## 2015-03-16 NOTE — Anesthesia Preprocedure Evaluation (Signed)
Anesthesia Evaluation  Patient identified by MRN, date of birth, ID band Patient awake    Reviewed: Allergy & Precautions, NPO status , Patient's Chart, lab work & pertinent test results  Airway Mallampati: II   Neck ROM: full    Dental   Pulmonary COPDCurrent Smoker,  breath sounds clear to auscultation        Cardiovascular hypertension, + CAD, + Past MI, + Cardiac Stents and + Peripheral Vascular Disease Rhythm:regular Rate:Normal     Neuro/Psych    GI/Hepatic GERD-  ,  Endo/Other    Renal/GU      Musculoskeletal  (+) Arthritis -,   Abdominal   Peds  Hematology   Anesthesia Other Findings   Reproductive/Obstetrics                             Anesthesia Physical Anesthesia Plan  ASA: III  Anesthesia Plan: General   Post-op Pain Management:    Induction: Intravenous  Airway Management Planned: Oral ETT  Additional Equipment:   Intra-op Plan:   Post-operative Plan: Extubation in OR  Informed Consent: I have reviewed the patients History and Physical, chart, labs and discussed the procedure including the risks, benefits and alternatives for the proposed anesthesia with the patient or authorized representative who has indicated his/her understanding and acceptance.     Plan Discussed with: CRNA, Anesthesiologist and Surgeon  Anesthesia Plan Comments:         Anesthesia Quick Evaluation

## 2015-03-16 NOTE — H&P (Signed)
Don [name] is an 64 y.o. male.   Chief Complaint: hoarseness HPI: hx of vocal lesion and now needs biopsy  Past Medical History  Diagnosis Date  . Hyperlipidemia   . Hypertension   . Arthritis   . COPD (chronic obstructive pulmonary disease)   . GERD (gastroesophageal reflux disease)   . Myocardial infarction 2000    2 stents  . Peripheral vascular disease     iliac artery clot  . Pneumonia 02/2015  . Cancer     throat  . Melanoma of back     melanoma on back  . Basal cell carcinoma   . Coronary artery disease     Past Surgical History  Procedure Laterality Date  . Elbow surgery      bilateral  . Shoulder surgery Right     rotator cuff  . Appendectomy    . Cholecystectomy    . Back surgery    . Throat surgery  1997    cancer removed  . Knee surgery Left   . Abdominal angiogram N/A 01/13/2015    Procedure: ABDOMINAL ANGIOGRAM;  Surgeon: Rosetta Posner, MD;  Location: [facility] CATH LAB;  Service: Cardiovascular;  Laterality: N/A;  . Cardiac catheterization  2000/2012    with stents in 2000  . Colonoscopy    . Esophagogastroduodenoscopy    . Peyronie's surgery      Family History  Problem Relation Age of Onset  . Cancer Mother     bone  . Stroke Father    Social History:  reports that he has been smoking Cigarettes.  He has smoked for the past 40 years. He has never used smokeless tobacco. He reports that he does not drink alcohol or use illicit drugs.  Allergies:  Allergies  Allergen Reactions  . Contrast Media [Iodinated Diagnostic Agents] Rash    Also developed blisters  . Penicillins Rash    Medications Prior to Admission  Medication Sig Dispense Refill  . ALPRAZolam (XANAX) 0.25 MG tablet Take 0.25 mg by mouth at bedtime. Take one every morning and evening    . aspirin 81 MG tablet Take 162 mg by mouth daily.     . clopidogrel (PLAVIX) 75 MG tablet Take 1 tablet (75 mg total) by mouth daily. 30 tablet 11  . cyclobenzaprine (FLEXERIL) 10 MG tablet Take  10 mg by mouth at bedtime.     Marland Kitchen doxycycline (VIBRAMYCIN) 100 MG capsule Take 1 capsule by mouth daily.  0  . DULoxetine (CYMBALTA) 60 MG capsule Take 60 mg by mouth Nightly.    . fluticasone (FLOVENT DISKUS) 50 MCG/BLIST diskus inhaler Inhale 1 puff into the lungs 2 (two) times daily. Takes as needed    . metoprolol (LOPRESSOR) 50 MG tablet Take 50 mg by mouth 2 (two) times daily.    Marland Kitchen omeprazole (PRILOSEC) 20 MG capsule Take 20 mg by mouth daily.    . simvastatin (ZOCOR) 40 MG tablet Take 40 mg by mouth daily.    . traZODone (DESYREL) 150 MG tablet Take 150 mg by mouth at bedtime.      Results for orders placed or performed during the hospital encounter of 03/16/15 (from the past 48 hour(s))  Basic metabolic panel     Status: Abnormal   Collection Time: 03/16/15 10:15 AM  Result Value Ref Range   Sodium 139 135 - 145 mmol/L   Potassium 4.0 3.5 - 5.1 mmol/L   Chloride 102 101 - 111 mmol/L   CO2 28 22 -  32 mmol/L   Glucose, Bld 126 (H) 70 - 99 mg/dL   BUN 17 6 - 20 mg/dL   Creatinine, Ser 0.98 0.61 - 1.24 mg/dL   Calcium 9.0 8.9 - 10.3 mg/dL   GFR calc non Af Amer >60 >60 mL/min   GFR calc Af Amer >60 >60 mL/min    Comment: (NOTE) The eGFR has been calculated using the CKD EPI equation. This calculation has not been validated in all clinical situations. eGFR's persistently <90 mL/min signify possible Chronic Kidney Disease.    Anion gap 9 5 - 15  CBC     Status: Abnormal   Collection Time: 03/16/15 10:15 AM  Result Value Ref Range   WBC 14.6 (H) 4.0 - 10.5 K/uL   RBC 4.59 4.22 - 5.81 MIL/uL   Hemoglobin 14.1 13.0 - 17.0 g/dL   HCT 41.0 39.0 - 52.0 %   MCV 89.3 78.0 - 100.0 fL   MCH 30.7 26.0 - 34.0 pg   MCHC 34.4 30.0 - 36.0 g/dL   RDW 13.2 11.5 - 15.5 %   Platelets 174 150 - 400 K/uL  Protime-INR     Status: None   Collection Time: 03/16/15 10:15 AM  Result Value Ref Range   Prothrombin Time 13.8 11.6 - 15.2 seconds   INR 1.05 0.00 - 1.49   No results  found.  Review of Systems  Constitutional: Negative.   HENT: Negative.   Eyes: Negative.   Respiratory: Negative.   Cardiovascular: Negative.   Skin: Negative.     Blood pressure 148/79, pulse 62, temperature 97.1 F (36.2 C), temperature source Oral, resp. rate 18, height '5\' 11"'  (1.803 m), weight 88.451 kg (195 lb), SpO2 98 %. Physical Exam  Constitutional: He appears well-developed and well-nourished.  HENT:  Head: Normocephalic.  Nose: Nose normal.  Mouth/Throat: Oropharynx is clear and moist.  Eyes: Conjunctivae are normal. Pupils are equal, round, and reactive to light.  Neck: Normal range of motion. Neck supple.  Cardiovascular: Normal rate.   Respiratory: Effort normal.  GI: Soft.  Musculoskeletal: Normal range of motion.     Assessment/Plan Vocal cord lesion- hx of lesion and now with leukoplakia on the cord. Microlaryngoscopy discussed and ready to proceed  Melissa Montane 03/16/2015, 1:32 PM

## 2015-03-16 NOTE — Anesthesia Procedure Notes (Signed)
Procedure Name: Intubation Performed by: Gean Maidens Pre-anesthesia Checklist: Patient identified, Emergency Drugs available, Suction available, Patient being monitored and Timeout performed Patient Re-evaluated:Patient Re-evaluated prior to inductionOxygen Delivery Method: Circle system utilized Preoxygenation: Pre-oxygenation with 100% oxygen Intubation Type: IV induction Laryngoscope Size: Mac and 3 Grade View: Grade I Tube type: Oral Laser Tube: Laser Tube and Cuffed inflated with minimal occlusive pressure - saline Tube size: 6.0 mm Number of attempts: 1 Placement Confirmation: ETT inserted through vocal cords under direct vision,  breath sounds checked- equal and bilateral,  positive ETCO2 and CO2 detector Secured at: 23 cm Tube secured with: Tape Dental Injury: Teeth and Oropharynx as per pre-operative assessment

## 2015-03-16 NOTE — Progress Notes (Signed)
Report given to megan rn as caregiver 

## 2015-03-16 NOTE — Op Note (Signed)
Preop/postop diagnosis: Left vocal cord lesion Procedure: Microlaryngoscopy with left vocal cord stripping Anesthesia: Gen. 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 He has had increasing hoarseness. He was informed a risk and benefits of the procedure and all his questions are answered. Options were discussed. Consent was obtained. Procedure: Patient was taken to the operating room placed in the supine position after general endotracheal tube anesthesia was placed in the rose position. The Dedo scope was inserted and the upper gum was protected with a Ray-Tec. The vocal cords were then examined with the  microscope. There was some slight thickening of both cores but the left cord had an obvious leukoplakia area that was on the surface and then extended medial along the vocal fold. This area was grasped with a cup forceps and the scissors were used to dissected superficial scan off of the vocal cord along with this leukoplakia changes. Hemostasis was achieved with adrenaline-soaked pledgets. The subglottis look normal. The remaining of exam was normal. There was good hemostasis and the scope was removed. The patient's face was protected with gauze protection as well as a towel and that was all removed. The patient was then awakened brought to recovery room in stable condition counts correct

## 2015-03-16 NOTE — Transfer of Care (Signed)
Immediate Anesthesia Transfer of Care Note  Patient: Don [name]  Procedure(s) Performed: Procedure(s): MICROLARYNGOSCOPY  (N/A)  Patient Location: PACU  Anesthesia Type:General  Level of Consciousness: awake, alert  and oriented  Airway & Oxygen Therapy: Patient Spontanous Breathing and Patient connected to nasal cannula oxygen  Post-op Assessment: Report given to RN and Post -op Vital signs reviewed and stable  Post vital signs: Reviewed and stable  Last Vitals:  Filed Vitals:   03/16/15 0946  BP: 148/79  Pulse: 62  Temp: 36.2 C  Resp: 18    Complications: No apparent anesthesia complications

## 2015-03-17 ENCOUNTER — Encounter (HOSPITAL_COMMUNITY): Payer: Self-pay | Admitting: Otolaryngology

## 2015-03-17 NOTE — Anesthesia Postprocedure Evaluation (Signed)
  Anesthesia Post-op Note  Patient: Don [name]  Procedure(s) Performed: Procedure(s) (LRB): MICROLARYNGOSCOPY  (N/A)  Patient Location: PACU  Anesthesia Type: General  Level of Consciousness: awake and alert   Airway and Oxygen Therapy: Patient Spontanous Breathing  Post-op Pain: mild  Post-op Assessment: Post-op Vital signs reviewed, Patient's Cardiovascular Status Stable, Respiratory Function Stable, Patent Airway and No signs of Nausea or vomiting  Last Vitals:  Filed Vitals:   03/16/15 1543  BP: 163/88  Pulse: 61  Temp:   Resp: 18    Post-op Vital Signs: stable   Complications: No apparent anesthesia complications

## 2015-04-15 ENCOUNTER — Institutional Professional Consult (permissible substitution): Payer: Commercial Managed Care - HMO | Admitting: Emergency Medicine

## 2015-05-06 ENCOUNTER — Institutional Professional Consult (permissible substitution): Payer: Commercial Managed Care - HMO | Admitting: Emergency Medicine

## 2015-07-14 ENCOUNTER — Institutional Professional Consult (permissible substitution): Payer: Commercial Managed Care - HMO | Admitting: Internal Medicine

## 2015-12-17 DIAGNOSIS — S61012A Laceration without foreign body of left thumb without damage to nail, initial encounter: Secondary | ICD-10-CM | POA: Insufficient documentation

## 2015-12-17 HISTORY — DX: Laceration without foreign body of left thumb without damage to nail, initial encounter: S61.012A

## 2016-01-25 ENCOUNTER — Other Ambulatory Visit: Payer: Self-pay | Admitting: Vascular Surgery

## 2016-02-15 ENCOUNTER — Telehealth: Payer: Self-pay

## 2016-02-15 DIAGNOSIS — Z95820 Peripheral vascular angioplasty status with implants and grafts: Secondary | ICD-10-CM

## 2016-02-15 DIAGNOSIS — I70211 Atherosclerosis of native arteries of extremities with intermittent claudication, right leg: Secondary | ICD-10-CM

## 2016-02-15 DIAGNOSIS — R0989 Other specified symptoms and signs involving the circulatory and respiratory systems: Secondary | ICD-10-CM

## 2016-02-15 DIAGNOSIS — R231 Pallor: Secondary | ICD-10-CM

## 2016-02-15 NOTE — Telephone Encounter (Signed)
Phone call from pt's wife.  Reported pt. C/o increased discomfort in right hip with walking.  Reported "the pain eases-up a little while after stopping."  Stated he has some pain at night, but not as bad as with walking.  Denied any open sores of right LE.  Stated he has no mobility problems with the right foot/ toes.  Reported pt. c/o worsening of the pain over past 2 days.  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  Stated his feet feel colder over past week.  Stated he c/o that his ankle will give-out periodically when he is walking.  Advised that pt. has had acute changes with increase pain over 2 days, lack of pulse in right LE, and cold temperature of right foot, and should go to the ER. The pt's wife stated she can't convince him to go to the ER.  Advised to try to encourage him to go, and this nurse will f/u in AM with pt.  Wife verb. Understanding.

## 2016-02-16 NOTE — Telephone Encounter (Signed)
Discussed with Dr. Oneida Alar.  Recommended to bring pt. In within the next 2 weeks for ABI's. Will notify pt. With appt.

## 2016-02-17 NOTE — Telephone Encounter (Signed)
Sched ABI for 02/23/16 at 10:30 and VWB for 02/28/16 at 3:45.  Lm on cell# to inform pt of appts.

## 2016-02-18 ENCOUNTER — Encounter: Payer: Self-pay | Admitting: Surgery

## 2016-02-23 ENCOUNTER — Encounter (HOSPITAL_COMMUNITY): Payer: Commercial Managed Care - HMO

## 2016-02-28 ENCOUNTER — Ambulatory Visit: Payer: Commercial Managed Care - HMO | Admitting: Surgery

## 2016-03-03 ENCOUNTER — Ambulatory Visit (HOSPITAL_COMMUNITY)
Admission: RE | Admit: 2016-03-03 | Discharge: 2016-03-03 | Disposition: A | Discharge status: Home / Self Care | Payer: Commercial Managed Care - HMO | Source: Ambulatory Visit | Attending: Surgery | Admitting: Surgery

## 2016-03-03 DIAGNOSIS — Z959 Presence of cardiac and vascular implant and graft, unspecified: Secondary | ICD-10-CM | POA: Diagnosis not present

## 2016-03-03 DIAGNOSIS — K219 Gastro-esophageal reflux disease without esophagitis: Secondary | ICD-10-CM | POA: Insufficient documentation

## 2016-03-03 DIAGNOSIS — I2510 Atherosclerotic heart disease of native coronary artery without angina pectoris: Secondary | ICD-10-CM | POA: Insufficient documentation

## 2016-03-03 DIAGNOSIS — E785 Hyperlipidemia, unspecified: Secondary | ICD-10-CM | POA: Diagnosis not present

## 2016-03-03 DIAGNOSIS — R0989 Other specified symptoms and signs involving the circulatory and respiratory systems: Secondary | ICD-10-CM | POA: Diagnosis not present

## 2016-03-03 DIAGNOSIS — R231 Pallor: Secondary | ICD-10-CM | POA: Diagnosis not present

## 2016-03-03 DIAGNOSIS — Z95820 Peripheral vascular angioplasty status with implants and grafts: Secondary | ICD-10-CM

## 2016-03-03 DIAGNOSIS — I10 Essential (primary) hypertension: Secondary | ICD-10-CM | POA: Diagnosis not present

## 2016-03-03 DIAGNOSIS — I70211 Atherosclerosis of native arteries of extremities with intermittent claudication, right leg: Secondary | ICD-10-CM | POA: Diagnosis not present

## 2016-03-07 ENCOUNTER — Encounter: Payer: Self-pay | Admitting: Surgery

## 2016-03-13 ENCOUNTER — Ambulatory Visit (INDEPENDENT_AMBULATORY_CARE_PROVIDER_SITE_OTHER): Payer: Commercial Managed Care - HMO | Admitting: Surgery

## 2016-03-13 ENCOUNTER — Encounter: Payer: Self-pay | Admitting: Surgery

## 2016-03-13 VITALS — BP 134/82 | HR 68 | Temp 98.3°F | Resp 16 | Ht 70.0 in | Wt 200.0 lb

## 2016-03-13 DIAGNOSIS — I70211 Atherosclerosis of native arteries of extremities with intermittent claudication, right leg: Secondary | ICD-10-CM

## 2016-03-13 NOTE — Progress Notes (Signed)
[facility]  Patient name: Don [name] MRN: [id number] DOB: 02-23-1951 Sex: male  REASON FOR VISIT: Follow up  HPI: Don [name] is a 65 y.o. male with a history of a left iliac stent in [place] 10 years ago.    The patient suffers from coronary artery disease. He is status post MI which was treated with stents in 2000. He now sees a cardiologist in [place], Dr. Bettina Gavia. His hypercholesterolemia is treated with a statin. He continues to smoke. He does have a history of throat cancer.  On 01/13/2015 he underwent aortogram with stenting of his left external iliac artery using a 7 x 30 stent.  Recently the Ace awoke with pain in his right hip.  This aggressively gets worse with ambulation.  He is here today for further evaluation.    Past Medical History  Diagnosis Date  . Hyperlipidemia   . Hypertension   . Arthritis   . COPD (chronic obstructive pulmonary disease) ([place])   . GERD (gastroesophageal reflux disease)   . Myocardial infarction ([place]) 2000    2 stents  . Peripheral vascular disease (HCC)     iliac artery clot  . Pneumonia 02/2015  . Cancer (HCC)     throat  . Melanoma of back ([place])     melanoma on back  . Basal cell carcinoma   . Coronary artery disease     Family History  Problem Relation Age of Onset  . Cancer Mother     bone  . Stroke Father     SOCIAL HISTORY: Social History  Substance Use Topics  . Smoking status: Current Every Day Smoker -- 40 years    Types: Cigarettes  . Smokeless tobacco: Never Used     Comment: Stopped smoking in March 2017,  Vapor E-cigs  . Alcohol Use: No    Allergies  Allergen Reactions  . Prednisone     Depressed mood  . Contrast Media [Iodinated Diagnostic Agents] Rash    Also developed blisters  . Doxycycline Rash  . Penicillins Rash    Current Outpatient Prescriptions  Medication Sig Dispense Refill  . ALPRAZolam (XANAX) 0.25 MG tablet Take 0.25 mg by mouth at  bedtime. Take one every morning and evening    . aspirin 81 MG tablet Take 162 mg by mouth daily.     . clopidogrel (PLAVIX) 75 MG tablet TAKE ONE TABLET BY MOUTH ONCE DAILY 30 tablet 10  . cyclobenzaprine (FLEXERIL) 10 MG tablet Take 10 mg by mouth at bedtime.     . DULoxetine (CYMBALTA) 60 MG capsule Take 60 mg by mouth Nightly.    . fluticasone (FLOVENT DISKUS) 50 MCG/BLIST diskus inhaler Inhale 1 puff into the lungs 2 (two) times daily. Takes as needed    . metoprolol (LOPRESSOR) 50 MG tablet Take 50 mg by mouth 2 (two) times daily.    Marland Kitchen omeprazole (PRILOSEC) 20 MG capsule Take 20 mg by mouth daily.    . simvastatin (ZOCOR) 40 MG tablet Take 40 mg by mouth daily.    . traZODone (DESYREL) 150 MG tablet Take 150 mg by mouth at bedtime.     No current facility-administered medications for this visit.    REVIEW OF SYSTEMS:  [X]  denotes positive finding, [ ]  denotes negative finding Cardiac  Comments:  Chest pain or chest pressure:    Shortness of breath upon exertion:    Short of breath when lying flat:    Irregular heart rhythm:  Vascular    Pain in calf, thigh, or hip brought on by ambulation: x   Pain in feet at night that wakes you up from your sleep:     Blood clot in your veins:    Leg swelling:         Pulmonary    Oxygen at home:    Productive cough:     Wheezing:         Neurologic    Sudden weakness in arms or legs:     Sudden numbness in arms or legs:     Sudden onset of difficulty speaking or slurred speech:    Temporary loss of vision in one eye:     Problems with dizziness:         Gastrointestinal    Blood in stool:     Vomited blood:         Genitourinary    Burning when urinating:     Blood in urine:        Psychiatric    Major depression:         Hematologic    Bleeding problems:    Problems with blood clotting too easily:        Skin    Rashes or ulcers:        Constitutional    Fever or chills:      PHYSICAL EXAM: Filed Vitals:     03/13/16 1344  BP: 134/82  Pulse: 68  Temp: 98.3 F (36.8 C)  TempSrc: Oral  Resp: 16  Height: 5\' 10"  (1.778 m)  Weight: 200 lb (90.719 kg)  SpO2: 96%    GENERAL: The patient is a well-nourished male, in no acute distress. The vital signs are documented above. CARDIAC: There is a regular rate and rhythm.  VASCULAR: Palpable right pedal pulse PULMONARY: There is good air exchange bilaterally without wheezing or rales. ABDOMEN: Soft and non-tender with normal pitched bowel sounds.  MUSCULOSKELETAL: There are no major deformities or cyanosis. NEUROLOGIC: No focal weakness or paresthesias are detected. SKIN: There are no ulcers or rashes noted. PSYCHIATRIC: The patient has a normal affect.  DATA:  ABI on the right is 1.1 with triphasic waveforms.  ABI on the left is 0.67 with biphasic waveforms  MEDICAL ISSUES: I do not think that the patient's right hip pain is related to his underlying vascular disease.  He has a palpable pulse and normal ankle-brachial indices.  I have encouraged him to be evaluated for possible musculoskeletal issues with his right hip.  I have him scheduled for follow-up in 6 months for repeat duplex evaluation of his iliac stents and ABIs.   Annamarie Major [facility] [employer]: [phone]

## 2016-04-18 NOTE — Addendum Note (Signed)
Addended by: Thresa Ross C on: 04/18/2016 12:58 PM   Modules accepted: Orders

## 2016-11-03 ENCOUNTER — Encounter: Payer: Self-pay | Admitting: Surgery

## 2016-11-15 DIAGNOSIS — J387 Other diseases of larynx: Secondary | ICD-10-CM

## 2016-11-15 DIAGNOSIS — F17210 Nicotine dependence, cigarettes, uncomplicated: Secondary | ICD-10-CM | POA: Diagnosis not present

## 2016-11-15 DIAGNOSIS — J383 Other diseases of vocal cords: Secondary | ICD-10-CM | POA: Diagnosis not present

## 2016-11-15 DIAGNOSIS — R490 Dysphonia: Secondary | ICD-10-CM | POA: Diagnosis not present

## 2016-11-15 HISTORY — DX: Dysphonia: R49.0

## 2016-11-15 HISTORY — DX: Other diseases of larynx: J38.7

## 2016-11-16 ENCOUNTER — Other Ambulatory Visit: Payer: Self-pay | Admitting: Otolaryngology

## 2016-11-17 DIAGNOSIS — H353221 Exudative age-related macular degeneration, left eye, with active choroidal neovascularization: Secondary | ICD-10-CM | POA: Diagnosis not present

## 2016-11-20 ENCOUNTER — Ambulatory Visit (INDEPENDENT_AMBULATORY_CARE_PROVIDER_SITE_OTHER): Payer: Medicare Other | Admitting: Surgery

## 2016-11-20 ENCOUNTER — Ambulatory Visit (HOSPITAL_COMMUNITY)
Admission: RE | Admit: 2016-11-20 | Discharge: 2016-11-20 | Disposition: A | Discharge status: Home / Self Care | Payer: Medicare Other | Source: Ambulatory Visit | Attending: Surgery | Admitting: Surgery

## 2016-11-20 ENCOUNTER — Encounter: Payer: Self-pay | Admitting: Surgery

## 2016-11-20 ENCOUNTER — Ambulatory Visit (INDEPENDENT_AMBULATORY_CARE_PROVIDER_SITE_OTHER)
Admission: RE | Admit: 2016-11-20 | Discharge: 2016-11-20 | Disposition: A | Discharge status: Home / Self Care | Payer: Medicare Other | Source: Ambulatory Visit | Attending: Surgery | Admitting: Surgery

## 2016-11-20 VITALS — BP 134/84 | HR 77 | Temp 97.0°F | Resp 16 | Ht 70.0 in | Wt 205.0 lb

## 2016-11-20 DIAGNOSIS — I70212 Atherosclerosis of native arteries of extremities with intermittent claudication, left leg: Secondary | ICD-10-CM

## 2016-11-20 DIAGNOSIS — J449 Chronic obstructive pulmonary disease, unspecified: Secondary | ICD-10-CM | POA: Diagnosis not present

## 2016-11-20 DIAGNOSIS — C329 Malignant neoplasm of larynx, unspecified: Secondary | ICD-10-CM | POA: Diagnosis not present

## 2016-11-20 DIAGNOSIS — I70211 Atherosclerosis of native arteries of extremities with intermittent claudication, right leg: Secondary | ICD-10-CM | POA: Insufficient documentation

## 2016-11-20 DIAGNOSIS — Z01810 Encounter for preprocedural cardiovascular examination: Secondary | ICD-10-CM | POA: Diagnosis not present

## 2016-11-20 DIAGNOSIS — I255 Ischemic cardiomyopathy: Secondary | ICD-10-CM | POA: Diagnosis not present

## 2016-11-20 DIAGNOSIS — E785 Hyperlipidemia, unspecified: Secondary | ICD-10-CM | POA: Diagnosis not present

## 2016-11-20 DIAGNOSIS — I2510 Atherosclerotic heart disease of native coronary artery without angina pectoris: Secondary | ICD-10-CM | POA: Diagnosis not present

## 2016-11-20 DIAGNOSIS — I252 Old myocardial infarction: Secondary | ICD-10-CM | POA: Diagnosis not present

## 2016-11-20 NOTE — Progress Notes (Signed)
Vascular and Vein Specialist of [facility]  Patient name: Don [name] MRN: [id number] DOB: 01/15/1951 Sex: male  REASON FOR VISIT: Follow-up leg pain  HPI: Don [name] is a 66 y.o. male returns today for follow-up.  He has a history of a left common iliac stent placed in [place] in the mid 2000's.  On 01/13/2015 he underwent stenting of his left external iliac artery using a 7 x 30 stent.  He continues to complain that his feet are cool.  He does not have any claudication symptoms.  He does not have open wounds.  Patient does have a history of degenerative lower back disease.  He is scheduled to have surgery for throat cancer next week.  He continues to take dual antiplatelet therapy.  This is with Plavix and aspirin.  He is on a statin for hypercholesterolemia.  Past Medical History:  Diagnosis Date  . Arthritis   . Basal cell carcinoma   . Cancer (HCC)    throat  . COPD (chronic obstructive pulmonary disease) ([place])   . Coronary artery disease   . GERD (gastroesophageal reflux disease)   . Hyperlipidemia   . Hypertension   . Melanoma of back ([place])    melanoma on back  . Myocardial infarction 2000   2 stents  . Peripheral vascular disease (HCC)    iliac artery clot  . Pneumonia 02/2015    Family History  Problem Relation Age of Onset  . Cancer Mother     bone  . Stroke Father     SOCIAL HISTORY: Social History  Substance Use Topics  . Smoking status: Current Every Day Smoker    Types: E-cigarettes  . Smokeless tobacco: Never Used     Comment: Stopped smoking in March 2017,  Vapor E-cigs  . Alcohol use No    Allergies  Allergen Reactions  . Prednisone     Depressed mood  . Contrast Media [Iodinated Diagnostic Agents] Rash    Also developed blisters  . Doxycycline Rash  . Penicillins Rash    Has patient had a PCN reaction causing immediate rash, facial/tongue/throat swelling, SOB or lightheadedness with  hypotension:unsure Has patient had a PCN reaction causing severe rash involving mucus membranes or skin necrosis:unsure Has patient had a PCN reaction that required hospitalization:No Has patient had a PCN reaction occurring within the last 10 years:No If all of the above answers are "NO", then may proceed with Cephalosporin use.     Current Outpatient Prescriptions  Medication Sig Dispense Refill  . ALPRAZolam (XANAX) 0.25 MG tablet Take 0.25 mg by mouth 2 (two) times daily as needed for anxiety.     Marland Kitchen aspirin 81 MG tablet Take 162 mg by mouth daily.     . clopidogrel (PLAVIX) 75 MG tablet TAKE ONE TABLET BY MOUTH ONCE DAILY 30 tablet 10  . cyclobenzaprine (FLEXERIL) 10 MG tablet Take 10 mg by mouth at bedtime.     . DULoxetine (CYMBALTA) 60 MG capsule Take 60 mg by mouth 2 (two) times daily.     Marland Kitchen ipratropium-albuterol (DUONEB) 0.5-2.5 (3) MG/3ML SOLN Take 3 mLs by nebulization every 6 (six) hours as needed (for shortness of breath/wheezing. (with bronchitis)).    . Magnesium 250 MG TABS Take 250 mg by mouth daily.    . metoprolol (LOPRESSOR) 50 MG tablet Take 50 mg by mouth daily.     . Multiple Vitamins-Minerals (PRESERVISION AREDS 2 PO) Take 2 tablets by mouth daily.    Marland Kitchen omeprazole (PRILOSEC)  20 MG capsule Take 20 mg by mouth daily.    . simvastatin (ZOCOR) 40 MG tablet Take 40 mg by mouth at bedtime.     . traZODone (DESYREL) 150 MG tablet Take 150 mg by mouth at bedtime.     No current facility-administered medications for this visit.     REVIEW OF SYSTEMS:  [X]  denotes positive finding, [ ]  denotes negative finding Cardiac  Comments:  Chest pain or chest pressure:    Shortness of breath upon exertion:    Short of breath when lying flat:    Irregular heart rhythm:        Vascular    Pain in calf, thigh, or hip brought on by ambulation:    Pain in feet at night that wakes you up from your sleep:  x   Blood clot in your veins:    Leg swelling:  x       Pulmonary      Oxygen at home:    Productive cough:  x   Wheezing:         Neurologic    Sudden weakness in arms or legs:     Sudden numbness in arms or legs:     Sudden onset of difficulty speaking or slurred speech:    Temporary loss of vision in one eye:     Problems with dizziness:         Gastrointestinal    Blood in stool:     Vomited blood:         Genitourinary    Burning when urinating:     Blood in urine:        Psychiatric    Major depression:         Hematologic    Bleeding problems:    Problems with blood clotting too easily:        Skin    Rashes or ulcers:        Constitutional    Fever or chills:      PHYSICAL EXAM: Vitals:   11/20/16 0936  BP: 134/84  Pulse: 77  Resp: 16  Temp: 97 F (36.1 C)  TempSrc: Oral  SpO2: 97%  Weight: 205 lb (93 kg)  Height: 5\' 10"  (1.778 m)    GENERAL: The patient is a well-nourished male, in no acute distress. The vital signs are documented above. CARDIAC: There is a regular rate and rhythm.  VASCULAR: Palpable pedal pulses bilaterally PULMONARY: Non-labored respirations MUSCULOSKELETAL: There are no major deformities or cyanosis. NEUROLOGIC: No focal weakness or paresthesias are detected. SKIN: There are no ulcers or rashes noted. PSYCHIATRIC: The patient has a normal affect.  DATA:  ABI on the right is 1.19  ABI on the left is 1.04  Duplex evaluation of bilateral lower jimmy shows no evidence of stenosis.  MEDICAL ISSUES: I discussed with the patient that he has normal perfusion to both feet.  I have stressed the importance of surveillance.  He is scheduled to follow up in 1 year with repeat ABIs and aortoiliac duplex.  He'll contact us sooner if he develops recurrent issues    Annamarie Major, MD Vascular and Vein Specialists of [facility] [phone] Pager [phone]

## 2016-11-21 ENCOUNTER — Encounter (HOSPITAL_COMMUNITY)
Admission: RE | Admit: 2016-11-21 | Discharge: 2016-11-21 | Disposition: A | Discharge status: Home / Self Care | Payer: Medicare Other | Source: Ambulatory Visit | Attending: Otolaryngology | Admitting: Otolaryngology

## 2016-11-21 ENCOUNTER — Encounter (HOSPITAL_COMMUNITY): Payer: Self-pay

## 2016-11-21 ENCOUNTER — Other Ambulatory Visit (HOSPITAL_COMMUNITY): Payer: Self-pay | Admitting: *Deleted

## 2016-11-21 DIAGNOSIS — Z01812 Encounter for preprocedural laboratory examination: Secondary | ICD-10-CM | POA: Insufficient documentation

## 2016-11-21 LAB — CBC
HCT: 43.8 % (ref 39.0–52.0)
HEMOGLOBIN: 15.3 g/dL (ref 13.0–17.0)
MCH: 31.9 pg (ref 26.0–34.0)
MCHC: 34.9 g/dL (ref 30.0–36.0)
MCV: 91.3 fL (ref 78.0–100.0)
Platelets: 194 10*3/uL (ref 150–400)
RBC: 4.8 MIL/uL (ref 4.22–5.81)
RDW: 13.4 % (ref 11.5–15.5)
WBC: 11.3 10*3/uL — ABNORMAL HIGH (ref 4.0–10.5)

## 2016-11-21 LAB — BASIC METABOLIC PANEL
ANION GAP: 7 (ref 5–15)
BUN: 17 mg/dL (ref 6–20)
CHLORIDE: 100 mmol/L — AB (ref 101–111)
CO2: 31 mmol/L (ref 22–32)
Calcium: 9.2 mg/dL (ref 8.9–10.3)
Creatinine, Ser: 1.02 mg/dL (ref 0.61–1.24)
GFR calc non Af Amer: >60 mL/min (ref >60)
Glucose, Bld: 93 mg/dL (ref 65–99)
POTASSIUM: 4.4 mmol/L (ref 3.5–5.1)
Sodium: 138 mmol/L (ref 135–145)

## 2016-11-21 NOTE — Progress Notes (Signed)
Pt has cardiac clearance from Dr. Jimmie Molly. Pt denies chest pain or sob. States he just has an aggravating cough.

## 2016-11-21 NOTE — Addendum Note (Signed)
Addended by: Lianne Cure A on: 11/21/2016 02:33 PM   Modules accepted: Orders

## 2016-11-21 NOTE — Pre-Procedure Instructions (Addendum)
Don [name]  11/21/2016    Your procedure is scheduled on Thursday, November 23, 2016 at 1:30 PM.   Report to [facility] Entrance "A" Admitting Office at 11:30 AM.   Call this number if you have problems the morning of surgery: [phone]   Questions prior to day of surgery, please call [phone] between 8 & 4 PM.   Remember:  Do not eat food or drink liquids after midnight Wednesday, 11/22/16.  Take these medicines the morning of surgery with A SIP OF WATER: Duloxetine (Cymbalta), Metoprolol (Lopressor), Omeprazole (Prilosec), Alprazolam (Xanax) - if needed, Duoneb - if needed  Stop Multivitamins as of today prior to surgery.  Do NOT smoke 24 hours prior to surgery.   Do not wear jewelry.  Do not wear lotions, powders, or cologne.  Men may shave face and neck.  Do not bring valuables to the hospital.  [facility] is not responsible for any belongings or valuables.  Contacts, dentures or bridgework may not be worn into surgery.  Leave your suitcase in the car.  After surgery it may be brought to your room.  For patients admitted to the hospital, discharge time will be determined by your treatment team.  Patients discharged the day of surgery will not be allowed to drive home.   Special instructions:  [place] - Preparing for Surgery  Before surgery, you can play an important role.  Because skin is not sterile, your skin needs to be as free of germs as possible.  You can reduce the number of germs on you skin by washing with CHG (chlorahexidine gluconate) soap before surgery.  CHG is an antiseptic cleaner which kills germs and bonds with the skin to continue killing germs even after washing.  Please DO NOT use if you have an allergy to CHG or antibacterial soaps.  If your skin becomes reddened/irritated stop using the CHG and inform your nurse when you arrive at Short Stay.  Do not shave (including legs and underarms) for at least 48 hours prior to the  first CHG shower.  You may shave your face.  Please follow these instructions carefully:   1.  Shower with CHG Soap the night before surgery and the                    morning of Surgery.  2.  If you choose to wash your hair, wash your hair first as usual with your       normal shampoo.  3.  After you shampoo, rinse your hair and body thoroughly to remove the shampoo.  4.  Use CHG as you would any other liquid soap.  You can apply chg directly       to the skin and wash gently with scrungie or a clean washcloth.  5.  Apply the CHG Soap to your body ONLY FROM THE NECK DOWN.        Do not use on open wounds or open sores.  Avoid contact with your eyes, ears, mouth and genitals (private parts).  Wash genitals (private parts) with your normal soap.  6.  Wash thoroughly, paying special attention to the area where your surgery        will be performed.  7.  Thoroughly rinse your body with warm water from the neck down.  8.  DO NOT shower/wash with your normal soap after using and rinsing off       the CHG Soap.  9.  Pat yourself dry with a clean towel.            10.  Wear clean pajamas.            11.  Place clean sheets on your bed the night of your first shower and do not        sleep with pets.  Day of Surgery  Do not apply any lotions the morning of surgery.  Please wear clean clothes to the hospital.   Please read over the fact sheets that you were given.

## 2016-11-23 ENCOUNTER — Ambulatory Visit (HOSPITAL_COMMUNITY)
Admission: RE | Admit: 2016-11-23 | Discharge: 2016-11-23 | Disposition: A | Discharge status: Home / Self Care | Payer: Medicare Other | Source: Ambulatory Visit | Attending: Otolaryngology | Admitting: Otolaryngology

## 2016-11-23 ENCOUNTER — Ambulatory Visit (HOSPITAL_COMMUNITY): Payer: Medicare Other | Admitting: Certified Registered Nurse Anesthetist

## 2016-11-23 ENCOUNTER — Encounter (HOSPITAL_COMMUNITY): Payer: Self-pay | Admitting: Surgery

## 2016-11-23 ENCOUNTER — Encounter (HOSPITAL_COMMUNITY)
Admission: RE | Disposition: A | Discharge status: Home / Self Care | Payer: Self-pay | Source: Ambulatory Visit | Attending: Otolaryngology

## 2016-11-23 DIAGNOSIS — I252 Old myocardial infarction: Secondary | ICD-10-CM | POA: Insufficient documentation

## 2016-11-23 DIAGNOSIS — I739 Peripheral vascular disease, unspecified: Secondary | ICD-10-CM | POA: Insufficient documentation

## 2016-11-23 DIAGNOSIS — D380 Neoplasm of uncertain behavior of larynx: Secondary | ICD-10-CM | POA: Diagnosis not present

## 2016-11-23 DIAGNOSIS — Z880 Allergy status to penicillin: Secondary | ICD-10-CM | POA: Insufficient documentation

## 2016-11-23 DIAGNOSIS — D020 Carcinoma in situ of larynx: Secondary | ICD-10-CM | POA: Diagnosis not present

## 2016-11-23 DIAGNOSIS — F17210 Nicotine dependence, cigarettes, uncomplicated: Secondary | ICD-10-CM | POA: Insufficient documentation

## 2016-11-23 DIAGNOSIS — F419 Anxiety disorder, unspecified: Secondary | ICD-10-CM | POA: Insufficient documentation

## 2016-11-23 DIAGNOSIS — K219 Gastro-esophageal reflux disease without esophagitis: Secondary | ICD-10-CM | POA: Insufficient documentation

## 2016-11-23 DIAGNOSIS — F17290 Nicotine dependence, other tobacco product, uncomplicated: Secondary | ICD-10-CM | POA: Diagnosis not present

## 2016-11-23 DIAGNOSIS — Z79899 Other long term (current) drug therapy: Secondary | ICD-10-CM | POA: Diagnosis not present

## 2016-11-23 DIAGNOSIS — E785 Hyperlipidemia, unspecified: Secondary | ICD-10-CM | POA: Diagnosis not present

## 2016-11-23 DIAGNOSIS — J449 Chronic obstructive pulmonary disease, unspecified: Secondary | ICD-10-CM | POA: Diagnosis not present

## 2016-11-23 DIAGNOSIS — Z7982 Long term (current) use of aspirin: Secondary | ICD-10-CM | POA: Insufficient documentation

## 2016-11-23 DIAGNOSIS — Z7902 Long term (current) use of antithrombotics/antiplatelets: Secondary | ICD-10-CM | POA: Insufficient documentation

## 2016-11-23 DIAGNOSIS — J383 Other diseases of vocal cords: Secondary | ICD-10-CM | POA: Diagnosis not present

## 2016-11-23 DIAGNOSIS — I2510 Atherosclerotic heart disease of native coronary artery without angina pectoris: Secondary | ICD-10-CM | POA: Insufficient documentation

## 2016-11-23 DIAGNOSIS — F329 Major depressive disorder, single episode, unspecified: Secondary | ICD-10-CM | POA: Insufficient documentation

## 2016-11-23 DIAGNOSIS — Z8521 Personal history of malignant neoplasm of larynx: Secondary | ICD-10-CM | POA: Insufficient documentation

## 2016-11-23 DIAGNOSIS — Z85828 Personal history of other malignant neoplasm of skin: Secondary | ICD-10-CM | POA: Diagnosis not present

## 2016-11-23 DIAGNOSIS — I10 Essential (primary) hypertension: Secondary | ICD-10-CM | POA: Insufficient documentation

## 2016-11-23 DIAGNOSIS — Z91041 Radiographic dye allergy status: Secondary | ICD-10-CM | POA: Insufficient documentation

## 2016-11-23 DIAGNOSIS — Z85820 Personal history of malignant melanoma of skin: Secondary | ICD-10-CM | POA: Insufficient documentation

## 2016-11-23 HISTORY — PX: MICROLARYNGOSCOPY WITH CO2 LASER AND EXCISION OF VOCAL CORD LESION: SHX5970

## 2016-11-23 SURGERY — MICROLARYNGOSCOPY WITH CO2 LASER AND EXCISION OF VOCAL CORD LESION
Anesthesia: General | Site: Mouth

## 2016-11-23 MED ORDER — LACTATED RINGERS IV SOLN
INTRAVENOUS | Status: DC | PRN
  Administered 2016-11-23: 13:00:00 via INTRAVENOUS

## 2016-11-23 MED ORDER — ACETAMINOPHEN 325 MG PO TABS: 325.0000 mg | ORAL_TABLET | ORAL | Status: DC | PRN

## 2016-11-23 MED ORDER — LACTATED RINGERS IV SOLN
INTRAVENOUS | Status: DC
  Administered 2016-11-23: 11:00:00 via INTRAVENOUS

## 2016-11-23 MED ORDER — FENTANYL CITRATE (PF) 100 MCG/2ML IJ SOLN: 25.0000 ug | INTRAMUSCULAR | Status: DC | PRN

## 2016-11-23 MED ORDER — 0.9 % SODIUM CHLORIDE (POUR BTL) OPTIME
TOPICAL | Status: DC | PRN
  Administered 2016-11-23: 1000 mL

## 2016-11-23 MED ORDER — MEPERIDINE HCL 25 MG/ML IJ SOLN: 6.2500 mg | INTRAMUSCULAR | Status: DC | PRN

## 2016-11-23 MED ORDER — ARTIFICIAL TEARS OP OINT
TOPICAL_OINTMENT | OPHTHALMIC | Status: DC | PRN
  Administered 2016-11-23: 1 via OPHTHALMIC

## 2016-11-23 MED ORDER — ACETAMINOPHEN 160 MG/5ML PO SOLN
325.0000 mg | ORAL | Status: DC | PRN
  Filled 2016-11-23: qty 20.3

## 2016-11-23 MED ORDER — LIDOCAINE HCL (CARDIAC) 20 MG/ML IV SOLN
INTRAVENOUS | Status: DC | PRN
  Administered 2016-11-23: 50 mg via INTRAVENOUS

## 2016-11-23 MED ORDER — OXYCODONE HCL 5 MG/5ML PO SOLN: 5.0000 mg | Freq: Once | ORAL | Status: DC | PRN

## 2016-11-23 MED ORDER — FENTANYL CITRATE (PF) 100 MCG/2ML IJ SOLN
INTRAMUSCULAR | Status: AC
  Filled 2016-11-23: qty 4

## 2016-11-23 MED ORDER — EPINEPHRINE PF 1 MG/ML IJ SOLN
INTRAMUSCULAR | Status: DC | PRN
  Administered 2016-11-23: 1 mg

## 2016-11-23 MED ORDER — FENTANYL CITRATE (PF) 100 MCG/2ML IJ SOLN
INTRAMUSCULAR | Status: DC | PRN
  Administered 2016-11-23: 100 ug via INTRAVENOUS
  Administered 2016-11-23: 50 ug via INTRAVENOUS

## 2016-11-23 MED ORDER — OXYCODONE HCL 5 MG PO TABS: 5.0000 mg | ORAL_TABLET | Freq: Once | ORAL | Status: DC | PRN

## 2016-11-23 MED ORDER — PROPOFOL 10 MG/ML IV BOLUS
INTRAVENOUS | Status: DC | PRN
  Administered 2016-11-23: 200 mg via INTRAVENOUS

## 2016-11-23 MED ORDER — ROCURONIUM BROMIDE 100 MG/10ML IV SOLN
INTRAVENOUS | Status: DC | PRN
  Administered 2016-11-23: 20 mg via INTRAVENOUS
  Administered 2016-11-23: 40 mg via INTRAVENOUS

## 2016-11-23 MED ORDER — ONDANSETRON HCL 4 MG/2ML IJ SOLN: 4.0000 mg | Freq: Once | INTRAMUSCULAR | Status: DC | PRN

## 2016-11-23 MED ORDER — HYDROCODONE-ACETAMINOPHEN 5-325 MG PO TABS: 1.0000 | ORAL_TABLET | Freq: Four times a day (QID) | ORAL | 0 refills | Status: DC | PRN

## 2016-11-23 MED ORDER — ONDANSETRON HCL 4 MG/2ML IJ SOLN
INTRAMUSCULAR | Status: DC | PRN
  Administered 2016-11-23: 4 mg via INTRAVENOUS

## 2016-11-23 MED ORDER — EPINEPHRINE HCL (NASAL) 0.1 % NA SOLN
NASAL | Status: AC
  Filled 2016-11-23: qty 30

## 2016-11-23 MED ORDER — MIDAZOLAM HCL 5 MG/5ML IJ SOLN
INTRAMUSCULAR | Status: DC | PRN
  Administered 2016-11-23: 2 mg via INTRAVENOUS

## 2016-11-23 MED ORDER — DEXAMETHASONE SODIUM PHOSPHATE 10 MG/ML IJ SOLN
INTRAMUSCULAR | Status: DC | PRN
  Administered 2016-11-23: 10 mg via INTRAVENOUS

## 2016-11-23 MED ORDER — MIDAZOLAM HCL 2 MG/2ML IJ SOLN
INTRAMUSCULAR | Status: AC
  Filled 2016-11-23: qty 2

## 2016-11-23 MED ORDER — PROPOFOL 10 MG/ML IV BOLUS
INTRAVENOUS | Status: AC
  Filled 2016-11-23: qty 20

## 2016-11-23 MED ORDER — SUGAMMADEX SODIUM 200 MG/2ML IV SOLN
INTRAVENOUS | Status: DC | PRN
  Administered 2016-11-23: 372 mg via INTRAVENOUS

## 2016-11-23 SURGICAL SUPPLY — 28 items
BLADE SURG 10 STRL SS (BLADE) ×2 IMPLANT
BLADE SURG 15 STRL LF DISP TIS (BLADE) IMPLANT
BLADE SURG 15 STRL SS (BLADE)
CANISTER SUCTION 1200CC (MISCELLANEOUS) ×2 IMPLANT
CONT SPECI 4OZ STER CLIK (MISCELLANEOUS) ×2 IMPLANT
COVER MAYO STAND STRL (DRAPES) ×2 IMPLANT
COVER TABLE BACK 60X90 (DRAPES) ×2 IMPLANT
DRAPE PROXIMA HALF (DRAPES) ×2 IMPLANT
GAUZE SPONGE 4X4 12PLY STRL (GAUZE/BANDAGES/DRESSINGS) IMPLANT
GLOVE ECLIPSE 7.5 STRL STRAW (GLOVE) ×2 IMPLANT
GOWN STRL REUS W/ TWL XL LVL3 (GOWN DISPOSABLE) IMPLANT
GOWN STRL REUS W/TWL XL LVL3 (GOWN DISPOSABLE)
GUARD TEETH (MISCELLANEOUS) IMPLANT
KIT BASIN OR (CUSTOM PROCEDURE TRAY) ×2 IMPLANT
NEEDLE HYPO 18GX1.5 BLUNT FILL (NEEDLE) IMPLANT
NEEDLE HYPO 25GX1X1/2 BEV (NEEDLE) IMPLANT
NEEDLE SPNL 22GX3.5 QUINCKE BK (NEEDLE) IMPLANT
NS IRRIG 1000ML POUR BTL (IV SOLUTION) ×2 IMPLANT
PAD EYE OVAL STERILE LF (GAUZE/BANDAGES/DRESSINGS) ×4 IMPLANT
PATTIES SURGICAL .5 X3 (DISPOSABLE) IMPLANT
SOLUTION ANTI FOG 6CC (MISCELLANEOUS) ×2 IMPLANT
SOLUTION BUTLER CLEAR DIP (MISCELLANEOUS) ×2 IMPLANT
SURGILUBE 2OZ TUBE FLIPTOP (MISCELLANEOUS) IMPLANT
SYR 5ML LL (SYRINGE) ×2 IMPLANT
SYR CONTROL 10ML LL (SYRINGE) IMPLANT
TOWEL OR 17X24 6PK STRL BLUE (TOWEL DISPOSABLE) ×2 IMPLANT
TUBE CONNECTING 20X1/4 (TUBING) ×2 IMPLANT
WATER STERILE IRR 1000ML POUR (IV SOLUTION) ×2 IMPLANT

## 2016-11-23 NOTE — Anesthesia Postprocedure Evaluation (Addendum)
Anesthesia Post Note  Patient: Don [name]  Procedure(s) Performed: Procedure(s) (LRB): MICROLARYNGOSCOPY  AND EXCISION OF VOCAL CORD LESION (N/A)  Patient location during evaluation: PACU Anesthesia Type: General Level of consciousness: awake Pain management: pain level controlled Vital Signs Assessment: post-procedure vital signs reviewed and stable Respiratory status: spontaneous breathing Cardiovascular status: stable Postop Assessment: no signs of nausea or vomiting Anesthetic complications: no        Last Vitals:  Vitals:   11/23/16 1555 11/23/16 1605  BP: 139/81 139/69  Pulse: 72 71  Resp: 16 19  Temp:  36.1 C    Last Pain:  Vitals:   11/23/16 1455  TempSrc:   PainSc: Asleep   Pain Goal: Patients Stated Pain Goal: 3 (11/23/16 1111)               [place]

## 2016-11-23 NOTE — Anesthesia Preprocedure Evaluation (Addendum)
Anesthesia Evaluation  Patient identified by MRN, date of birth, ID band Patient awake    Reviewed: Allergy & Precautions, NPO status , Patient's Chart, lab work & pertinent test results  Airway Mallampati: II   Neck ROM: full    Dental no notable dental hx.    Pulmonary COPD, Current Smoker,    Pulmonary exam normal        Cardiovascular hypertension, + CAD, + Past MI, + Cardiac Stents and + Peripheral Vascular Disease  Normal cardiovascular exam     Neuro/Psych PSYCHIATRIC DISORDERS Anxiety Depression negative neurological ROS     GI/Hepatic GERD  Medicated and Controlled,  Endo/Other    Renal/GU   negative genitourinary   Musculoskeletal  (+) Arthritis ,   Abdominal (+) + obese,   Peds  Hematology   Anesthesia Other Findings   Reproductive/Obstetrics negative OB ROS                             Anesthesia Physical  Anesthesia Plan  ASA: III  Anesthesia Plan: General   Post-op Pain Management:    Induction: Intravenous  Airway Management Planned: Oral ETT  Additional Equipment:   Intra-op Plan:   Post-operative Plan: Extubation in OR  Informed Consent: I have reviewed the patients History and Physical, chart, labs and discussed the procedure including the risks, benefits and alternatives for the proposed anesthesia with the patient or authorized representative who has indicated his/her understanding and acceptance.   Dental advisory given  Plan Discussed with: CRNA, Anesthesiologist and Surgeon  Anesthesia Plan Comments:         Anesthesia Quick Evaluation

## 2016-11-23 NOTE — Discharge Instructions (Signed)
Call for appointment in approximately 3 weeks. Call if any issues arise including breathing difficulties. Regular diet should be appropriate. There should be any significant amount of pain and Motrin and Tylenol can be taken for the any discomfort.

## 2016-11-23 NOTE — H&P (Signed)
Don [name] is an 66 y.o. male.   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 Arthritis   . Basal cell carcinoma   . Cancer ([place])    throat - 1997, throat - 2018  . COPD (chronic obstructive pulmonary disease) ([place])   . Coronary artery disease   . GERD (gastroesophageal reflux disease)   . Hyperlipidemia   . Hypertension   . Melanoma of back ([place])    melanoma on back  . Myocardial infarction 2000   2 stents  . Peripheral vascular disease (HCC)    iliac artery clot  . Pneumonia 02/2015    Past Surgical History:  Procedure Laterality Date  . ABDOMINAL ANGIOGRAM N/A 01/13/2015   Procedure: ABDOMINAL ANGIOGRAM;  Surgeon: Rosetta Posner, MD;  Location: [facility] CATH LAB;  Service: Cardiovascular;  Laterality: N/A;  . APPENDECTOMY    . BACK SURGERY    . CARDIAC CATHETERIZATION  2000/2012   with stents in 2000  . CHOLECYSTECTOMY    . COLONOSCOPY    . ELBOW SURGERY     bilateral  . ESOPHAGOGASTRODUODENOSCOPY    . KNEE SURGERY Left   . MICROLARYNGOSCOPY WITH LASER N/A 03/16/2015   Procedure: MICROLARYNGOSCOPY ;  Surgeon: Melissa Montane, MD;  Location: [facility];  Service: ENT;  Laterality: N/A;  . peyronie's surgery    . SHOULDER SURGERY Right    rotator cuff  . THROAT SURGERY  1997   cancer removed    Family History  Problem Relation Age of Onset  . Cancer Mother     bone  . Stroke Father    Social History:  reports that he has been smoking E-cigarettes and Cigarettes.  He has never used smokeless tobacco. He reports that he does not drink alcohol or use drugs.  Allergies:  Allergies  Allergen Reactions  . Prednisone     Depressed mood - can tolerate it, but it makes him very irritable  . Contrast Media [Iodinated Diagnostic Agents] Rash    Also developed blisters  . Doxycycline Rash  . Penicillins Rash    Has patient had a PCN reaction causing immediate rash, facial/tongue/throat  swelling, SOB or lightheadedness with hypotension:unsure Has patient had a PCN reaction causing severe rash involving mucus membranes or skin necrosis:unsure Has patient had a PCN reaction that required hospitalization:No Has patient had a PCN reaction occurring within the last 10 years:No If all of the above answers are "NO", then may proceed with Cephalosporin use.     Medications Prior to Admission  Medication Sig Dispense Refill  . ALPRAZolam (XANAX) 0.25 MG tablet Take 0.25 mg by mouth 2 (two) times daily as needed for anxiety.     Marland Kitchen aspirin 81 MG tablet Take 162 mg by mouth daily.     . cyclobenzaprine (FLEXERIL) 10 MG tablet Take 10 mg by mouth at bedtime.     . DULoxetine (CYMBALTA) 60 MG capsule Take 60 mg by mouth 2 (two) times daily.     Marland Kitchen ipratropium-albuterol (DUONEB) 0.5-2.5 (3) MG/3ML SOLN Take 3 mLs by nebulization every 6 (six) hours as needed (for shortness of breath/wheezing. (with bronchitis)).    . Magnesium 250 MG TABS Take 250 mg by mouth daily.    . metoprolol (LOPRESSOR) 50 MG tablet Take 50 mg by mouth daily.     . Multiple Vitamins-Minerals (PRESERVISION AREDS 2 PO) Take 2 tablets by  mouth daily.    Marland Kitchen omeprazole (PRILOSEC) 20 MG capsule Take 20 mg by mouth daily.    . simvastatin (ZOCOR) 40 MG tablet Take 40 mg by mouth at bedtime.     . traZODone (DESYREL) 150 MG tablet Take 150 mg by mouth at bedtime.    . clopidogrel (PLAVIX) 75 MG tablet TAKE ONE TABLET BY MOUTH ONCE DAILY 30 tablet 10    Results for orders placed or performed during the hospital encounter of 11/21/16 (from the past 48 hour(s))  Basic metabolic panel     Status: Abnormal   Collection Time: 11/21/16  3:43 PM  Result Value Ref Range   Sodium 138 135 - 145 mmol/L   Potassium 4.4 3.5 - 5.1 mmol/L   Chloride 100 (L) 101 - 111 mmol/L   CO2 31 22 - 32 mmol/L   Glucose, Bld 93 65 - 99 mg/dL   BUN 17 6 - 20 mg/dL   Creatinine, Ser 1.02 0.61 - 1.24 mg/dL   Calcium 9.2 8.9 - 10.3 mg/dL   GFR  calc non Af Amer >60 >60 mL/min   GFR calc Af Amer >60 >60 mL/min    Comment: (NOTE) The eGFR has been calculated using the CKD EPI equation. This calculation has not been validated in all clinical situations. eGFR's persistently <60 mL/min signify possible Chronic Kidney Disease.    Anion gap 7 5 - 15  CBC     Status: Abnormal   Collection Time: 11/21/16  3:43 PM  Result Value Ref Range   WBC 11.3 (H) 4.0 - 10.5 K/uL   RBC 4.80 4.22 - 5.81 MIL/uL   Hemoglobin 15.3 13.0 - 17.0 g/dL   HCT 43.8 39.0 - 52.0 %   MCV 91.3 78.0 - 100.0 fL   MCH 31.9 26.0 - 34.0 pg   MCHC 34.9 30.0 - 36.0 g/dL   RDW 13.4 11.5 - 15.5 %   Platelets 194 150 - 400 K/uL   No results found.  Review of Systems  Constitutional: Negative.   HENT: Negative.   Eyes: Negative.   Respiratory: Negative.   Cardiovascular: Negative.   Skin: Negative.     Blood pressure (!) 154/96, pulse 70, temperature 98.1 F (36.7 C), temperature source Oral, resp. rate 20, SpO2 100 %. Physical Exam  Constitutional: He appears well-developed and well-nourished.  HENT:  Head: Normocephalic and atraumatic.  Eyes: Pupils are equal, round, and reactive to light.  Neck: Normal range of motion. Neck supple.  Cardiovascular: Normal rate.   Respiratory: Effort normal.  GI: Soft.  Musculoskeletal: Normal range of motion.     Assessment/Plan Vocal cord lesion of left- discussed procedure and ready to proceed  Melissa Montane, MD 11/23/2016, 1:17 PM

## 2016-11-23 NOTE — Transfer of Care (Signed)
Immediate Anesthesia Transfer of Care Note  Patient: Don [name]  Procedure(s) Performed: Procedure(s): MICROLARYNGOSCOPY  AND EXCISION OF VOCAL CORD LESION (N/A)  Patient Location: PACU  Anesthesia Type:General  Level of Consciousness: awake and responds to stimulation  Airway & Oxygen Therapy: Patient Spontanous Breathing and Patient connected to face mask oxygen  Post-op Assessment: Report given to RN and Post -op Vital signs reviewed and stable  Post vital signs: Reviewed and stable  Last Vitals:  Vitals:   11/23/16 1102 11/23/16 1455  BP: (!) 154/96   Pulse: 70   Resp: 20   Temp: 36.7 C 36.6 C    Last Pain:  Vitals:   11/23/16 1455  TempSrc:   PainSc: Asleep      Patients Stated Pain Goal: 3 ([id number])  Complications: No apparent anesthesia complications

## 2016-11-23 NOTE — Op Note (Signed)
Preop/postop diagnosis: Left vocal cord leukoplakia Procedure: Microlaryngoscopy with left vocal cord stripping Anesthesia: Gen. Estimated blood loss: Less than 5 mL Indications: 66 year old with a previous microlaryngoscopy and biopsy. 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 He is having slight amount hoarseness. He was informed a risk and benefits of the procedure and options were discussed all questions are answered and consent was obtained.  Procedure: Patient was taken to the operating room placed in the supine position after general anesthesia was placed in the rose position. The laser scope was inserted and the pharynx and larynx were examined. There was no obvious lesions except for the whitish area on the left vocal cord. The scope was suspended and visualization of the cords was made. The microscope was brought in to evaluate the cord and the leukoplakia the anterior aspect of it was grasped with a cup forcep and scissor dissection was made. This freed up the lining and it was removed from anterior posterior. This remove the whitish area. There was good hemostasis after adrenaline soaked pledget. The muscle was not violated. There was no other lesions identified. The patient was then awakened brought to recovery room in stable condition counts correct

## 2016-11-24 ENCOUNTER — Encounter (HOSPITAL_COMMUNITY): Payer: Self-pay | Admitting: Otolaryngology

## 2016-11-27 DIAGNOSIS — C4359 Malignant melanoma of other part of trunk: Secondary | ICD-10-CM | POA: Diagnosis not present

## 2016-11-27 DIAGNOSIS — L821 Other seborrheic keratosis: Secondary | ICD-10-CM | POA: Diagnosis not present

## 2016-11-27 DIAGNOSIS — L570 Actinic keratosis: Secondary | ICD-10-CM | POA: Diagnosis not present

## 2016-11-27 DIAGNOSIS — L578 Other skin changes due to chronic exposure to nonionizing radiation: Secondary | ICD-10-CM | POA: Diagnosis not present

## 2016-11-28 DIAGNOSIS — J449 Chronic obstructive pulmonary disease, unspecified: Secondary | ICD-10-CM | POA: Diagnosis not present

## 2016-11-28 DIAGNOSIS — Z85820 Personal history of malignant melanoma of skin: Secondary | ICD-10-CM | POA: Diagnosis not present

## 2016-11-28 DIAGNOSIS — I2510 Atherosclerotic heart disease of native coronary artery without angina pectoris: Secondary | ICD-10-CM | POA: Diagnosis not present

## 2016-11-28 DIAGNOSIS — K219 Gastro-esophageal reflux disease without esophagitis: Secondary | ICD-10-CM | POA: Diagnosis not present

## 2016-11-28 DIAGNOSIS — I252 Old myocardial infarction: Secondary | ICD-10-CM | POA: Diagnosis not present

## 2016-11-28 DIAGNOSIS — Z85828 Personal history of other malignant neoplasm of skin: Secondary | ICD-10-CM | POA: Diagnosis not present

## 2016-11-28 DIAGNOSIS — D020 Carcinoma in situ of larynx: Secondary | ICD-10-CM | POA: Diagnosis not present

## 2016-11-28 DIAGNOSIS — Z8521 Personal history of malignant neoplasm of larynx: Secondary | ICD-10-CM | POA: Diagnosis not present

## 2016-11-28 DIAGNOSIS — E785 Hyperlipidemia, unspecified: Secondary | ICD-10-CM | POA: Diagnosis not present

## 2016-11-28 DIAGNOSIS — I10 Essential (primary) hypertension: Secondary | ICD-10-CM | POA: Diagnosis not present

## 2016-12-01 DIAGNOSIS — E785 Hyperlipidemia, unspecified: Secondary | ICD-10-CM | POA: Insufficient documentation

## 2016-12-01 DIAGNOSIS — I25119 Atherosclerotic heart disease of native coronary artery with unspecified angina pectoris: Secondary | ICD-10-CM | POA: Insufficient documentation

## 2016-12-01 DIAGNOSIS — I429 Cardiomyopathy, unspecified: Secondary | ICD-10-CM

## 2016-12-01 HISTORY — DX: Hyperlipidemia, unspecified: E78.5

## 2016-12-01 HISTORY — DX: Cardiomyopathy, unspecified: I42.9

## 2016-12-01 HISTORY — DX: Atherosclerotic heart disease of native coronary artery with unspecified angina pectoris: I25.119

## 2016-12-13 DIAGNOSIS — J387 Other diseases of larynx: Secondary | ICD-10-CM | POA: Diagnosis not present

## 2016-12-13 DIAGNOSIS — C329 Malignant neoplasm of larynx, unspecified: Secondary | ICD-10-CM | POA: Insufficient documentation

## 2016-12-13 HISTORY — DX: Malignant neoplasm of larynx, unspecified: C32.9

## 2016-12-26 DIAGNOSIS — I252 Old myocardial infarction: Secondary | ICD-10-CM | POA: Diagnosis not present

## 2016-12-26 DIAGNOSIS — J449 Chronic obstructive pulmonary disease, unspecified: Secondary | ICD-10-CM | POA: Diagnosis not present

## 2016-12-26 DIAGNOSIS — I255 Ischemic cardiomyopathy: Secondary | ICD-10-CM | POA: Diagnosis not present

## 2016-12-28 DIAGNOSIS — I2510 Atherosclerotic heart disease of native coronary artery without angina pectoris: Secondary | ICD-10-CM | POA: Diagnosis not present

## 2016-12-28 DIAGNOSIS — I429 Cardiomyopathy, unspecified: Secondary | ICD-10-CM | POA: Diagnosis not present

## 2017-01-02 DIAGNOSIS — J449 Chronic obstructive pulmonary disease, unspecified: Secondary | ICD-10-CM | POA: Diagnosis not present

## 2017-01-02 DIAGNOSIS — J383 Other diseases of vocal cords: Secondary | ICD-10-CM | POA: Diagnosis not present

## 2017-01-02 DIAGNOSIS — D020 Carcinoma in situ of larynx: Secondary | ICD-10-CM | POA: Diagnosis not present

## 2017-01-02 DIAGNOSIS — R490 Dysphonia: Secondary | ICD-10-CM | POA: Diagnosis not present

## 2017-01-03 ENCOUNTER — Other Ambulatory Visit: Payer: Self-pay | Admitting: Otolaryngology

## 2017-01-08 ENCOUNTER — Inpatient Hospital Stay (HOSPITAL_COMMUNITY): Admit: 2017-01-08 | Payer: Medicare Other

## 2017-01-10 ENCOUNTER — Encounter (HOSPITAL_COMMUNITY): Payer: Self-pay

## 2017-01-10 ENCOUNTER — Encounter (HOSPITAL_COMMUNITY)
Admission: RE | Admit: 2017-01-10 | Discharge: 2017-01-10 | Disposition: A | Discharge status: Home / Self Care | Payer: Medicare Other | Source: Ambulatory Visit | Attending: Otolaryngology | Admitting: Otolaryngology

## 2017-01-10 DIAGNOSIS — F329 Major depressive disorder, single episode, unspecified: Secondary | ICD-10-CM | POA: Diagnosis not present

## 2017-01-10 DIAGNOSIS — Z8521 Personal history of malignant neoplasm of larynx: Secondary | ICD-10-CM | POA: Diagnosis not present

## 2017-01-10 DIAGNOSIS — I10 Essential (primary) hypertension: Secondary | ICD-10-CM | POA: Diagnosis not present

## 2017-01-10 DIAGNOSIS — Z6830 Body mass index (BMI) 30.0-30.9, adult: Secondary | ICD-10-CM | POA: Diagnosis not present

## 2017-01-10 DIAGNOSIS — I2510 Atherosclerotic heart disease of native coronary artery without angina pectoris: Secondary | ICD-10-CM | POA: Diagnosis not present

## 2017-01-10 DIAGNOSIS — Z880 Allergy status to penicillin: Secondary | ICD-10-CM | POA: Diagnosis not present

## 2017-01-10 DIAGNOSIS — E669 Obesity, unspecified: Secondary | ICD-10-CM | POA: Diagnosis not present

## 2017-01-10 DIAGNOSIS — Z85820 Personal history of malignant melanoma of skin: Secondary | ICD-10-CM | POA: Diagnosis not present

## 2017-01-10 DIAGNOSIS — F17210 Nicotine dependence, cigarettes, uncomplicated: Secondary | ICD-10-CM | POA: Diagnosis not present

## 2017-01-10 DIAGNOSIS — Z7982 Long term (current) use of aspirin: Secondary | ICD-10-CM | POA: Diagnosis not present

## 2017-01-10 DIAGNOSIS — I252 Old myocardial infarction: Secondary | ICD-10-CM | POA: Diagnosis not present

## 2017-01-10 DIAGNOSIS — Z881 Allergy status to other antibiotic agents status: Secondary | ICD-10-CM | POA: Diagnosis not present

## 2017-01-10 DIAGNOSIS — Z7902 Long term (current) use of antithrombotics/antiplatelets: Secondary | ICD-10-CM | POA: Diagnosis not present

## 2017-01-10 DIAGNOSIS — J383 Other diseases of vocal cords: Secondary | ICD-10-CM | POA: Diagnosis not present

## 2017-01-10 DIAGNOSIS — K219 Gastro-esophageal reflux disease without esophagitis: Secondary | ICD-10-CM | POA: Diagnosis not present

## 2017-01-10 DIAGNOSIS — F419 Anxiety disorder, unspecified: Secondary | ICD-10-CM | POA: Diagnosis not present

## 2017-01-10 DIAGNOSIS — J449 Chronic obstructive pulmonary disease, unspecified: Secondary | ICD-10-CM | POA: Diagnosis not present

## 2017-01-10 DIAGNOSIS — Z91041 Radiographic dye allergy status: Secondary | ICD-10-CM | POA: Diagnosis not present

## 2017-01-10 DIAGNOSIS — E785 Hyperlipidemia, unspecified: Secondary | ICD-10-CM | POA: Diagnosis not present

## 2017-01-10 DIAGNOSIS — Z888 Allergy status to other drugs, medicaments and biological substances status: Secondary | ICD-10-CM | POA: Diagnosis not present

## 2017-01-10 DIAGNOSIS — M1990 Unspecified osteoarthritis, unspecified site: Secondary | ICD-10-CM | POA: Diagnosis not present

## 2017-01-10 DIAGNOSIS — I739 Peripheral vascular disease, unspecified: Secondary | ICD-10-CM | POA: Diagnosis not present

## 2017-01-10 HISTORY — DX: Other diseases of vocal cords: J38.3

## 2017-01-10 HISTORY — DX: Presence of spectacles and contact lenses: Z97.3

## 2017-01-10 LAB — CBC
HEMATOCRIT: 43.4 % (ref 39.0–52.0)
Hemoglobin: 15 g/dL (ref 13.0–17.0)
MCH: 31.7 pg (ref 26.0–34.0)
MCHC: 34.6 g/dL (ref 30.0–36.0)
MCV: 91.8 fL (ref 78.0–100.0)
Platelets: 195 10*3/uL (ref 150–400)
RBC: 4.73 MIL/uL (ref 4.22–5.81)
RDW: 13.4 % (ref 11.5–15.5)
WBC: 12.2 10*3/uL — AB (ref 4.0–10.5)

## 2017-01-10 LAB — BASIC METABOLIC PANEL
Anion gap: 7 (ref 5–15)
BUN: 16 mg/dL (ref 6–20)
CHLORIDE: 106 mmol/L (ref 101–111)
CO2: 27 mmol/L (ref 22–32)
CREATININE: 0.98 mg/dL (ref 0.61–1.24)
Calcium: 9.1 mg/dL (ref 8.9–10.3)
GFR calc Af Amer: >60 mL/min (ref >60)
GLUCOSE: 125 mg/dL — AB (ref 65–99)
POTASSIUM: 4.2 mmol/L (ref 3.5–5.1)
SODIUM: 140 mmol/L (ref 135–145)

## 2017-01-10 NOTE — Progress Notes (Signed)
Pt denies SOB and chest pain. Pt under the care of Dr. Bettina Gavia, Cardiology. Pt stated that last dose of Plavix and Aspirin was Wednesday. Anesthesia reviewed pt history; see note.

## 2017-01-10 NOTE — Progress Notes (Signed)
Anesthesia Chart Review:  Pt is a 66 year old male scheduled for microlaryngoscopy with CO2 laser and excision of vocal cord lesion on 01/11/2017 with Melissa Montane, MD.   - Cardiologist is Shirlee More, MD, last office visit 12/28/16 (notes in care everywhere).   PMH includes:  CAD (2 stents), cardiomyopathy, HTN, hyperlipidemia, melanoma, COPD, PVD, throat cancer, GERD. Current smoker. BMI 31. S/p microlarynoscopy 11/23/16 and 03/16/15.   Medications include: ASA 81 mg, Plavix, ipratropium-albuterol, metoprolol, Prilosec, simvastatin. Pt stopped ASA and plavix 01/03/17  Preoperative labs reviewed.    EKG1/8/18: Sinus rhythm. Incomplete RBBB and LAFB.  Echo 12/26/16 [facility] cardiology [place]): 1. Borderline LV dilatation, mild diffuse hypokinesis, moderate posterior hypokinesis. EF 47%. 2. No appreciable mitral regurgitation or significant valvular disease. 3. Normal right heart size/function, trace TR.  Cardiac cath 03/03/11 (at [facility]; report found in correspondence 03/18/15 in media tab): - LM: normal - LAD: mid LAD 40% stenosis  - CX: normal - RCA: Chronic occlusion of RCA. Moderate collateral flow from distal LAD to distal RCA - Moderate inferior segmental LV systolic function  Pt had cardiac clearance for same procedure done 11/23/16 and tolerated it without issue. If no changes, I anticipate pt can proceed with surgery as scheduled.   Willeen Cass, FNP-BC [facility]/Anesthesiology Phone: [phone] 01/10/2017 3:34 PM

## 2017-01-10 NOTE — Progress Notes (Signed)
Pt denies stress test.

## 2017-01-10 NOTE — Pre-Procedure Instructions (Signed)
    Don [name]  01/10/2017      [facility] [address] [place] [id number] [place] [id number] Phone: [phone] Fax: [phone]    Your procedure is scheduled on Thursday, January 11, 2017  Report to [facility] Admitting at 11:00 A.M.  Call this number if you have problems the morning of surgery:  [phone]   Remember:  Do not eat food or drink liquids after midnight.  Take these medicines the morning of surgery with A SIP OF WATER :  DULoxetine (CYMBALTA), metoprolol (LOPRESSOR), omeprazole (PRILOSEC), if needed: ALPRAZolam Duanne Moron) for anxiety,  DUONEB  for shortness of breath/wheezing. (with bronchitis) Stop taking Aspirin, Plavix, vitamins, fish oil and herbal medications. Do not take any NSAIDs ie: Ibuprofen, Advil, Naproxen, BC and Goody Powder or any medication containing Aspirin; stop now.  Do not wear jewelry, make-up or nail polish.  Do not wear lotions, powders, or perfumes, or deoderant.  Do not shave 48 hours prior to surgery.  Men may shave face and neck.  Do not bring valuables to the hospital.  [facility] is not responsible for any belongings or valuables. Contacts, dentures or bridgework may not be worn into surgery.  Leave your suitcase in the car.  After surgery it may be brought to your room. For patients admitted to the hospital, discharge time will be determined by your treatment team. Patients discharged the day of surgery will not be allowed to drive home.  Special instructions: Shower the night before surgery and the morning of surgery with CHG. Please read over the following fact sheets that you were given. Pain Booklet, Coughing and Deep Breathing and Surgical Site Infection Prevention

## 2017-01-11 ENCOUNTER — Ambulatory Visit (HOSPITAL_COMMUNITY): Payer: Medicare Other | Admitting: Certified Registered Nurse Anesthetist

## 2017-01-11 ENCOUNTER — Ambulatory Visit (HOSPITAL_COMMUNITY): Payer: Medicare Other | Admitting: Emergency Medicine

## 2017-01-11 ENCOUNTER — Encounter (HOSPITAL_COMMUNITY): Payer: Self-pay

## 2017-01-11 ENCOUNTER — Ambulatory Visit (HOSPITAL_COMMUNITY)
Admission: RE | Admit: 2017-01-11 | Discharge: 2017-01-11 | Disposition: A | Discharge status: Home / Self Care | Payer: Medicare Other | Source: Ambulatory Visit | Attending: Otolaryngology | Admitting: Otolaryngology

## 2017-01-11 ENCOUNTER — Encounter (HOSPITAL_COMMUNITY)
Admission: RE | Disposition: A | Discharge status: Home / Self Care | Payer: Self-pay | Source: Ambulatory Visit | Attending: Otolaryngology

## 2017-01-11 DIAGNOSIS — F419 Anxiety disorder, unspecified: Secondary | ICD-10-CM | POA: Insufficient documentation

## 2017-01-11 DIAGNOSIS — M1990 Unspecified osteoarthritis, unspecified site: Secondary | ICD-10-CM | POA: Insufficient documentation

## 2017-01-11 DIAGNOSIS — Z880 Allergy status to penicillin: Secondary | ICD-10-CM | POA: Insufficient documentation

## 2017-01-11 DIAGNOSIS — Z6830 Body mass index (BMI) 30.0-30.9, adult: Secondary | ICD-10-CM | POA: Insufficient documentation

## 2017-01-11 DIAGNOSIS — F329 Major depressive disorder, single episode, unspecified: Secondary | ICD-10-CM | POA: Insufficient documentation

## 2017-01-11 DIAGNOSIS — K219 Gastro-esophageal reflux disease without esophagitis: Secondary | ICD-10-CM | POA: Diagnosis not present

## 2017-01-11 DIAGNOSIS — J383 Other diseases of vocal cords: Secondary | ICD-10-CM | POA: Diagnosis not present

## 2017-01-11 DIAGNOSIS — J449 Chronic obstructive pulmonary disease, unspecified: Secondary | ICD-10-CM | POA: Insufficient documentation

## 2017-01-11 DIAGNOSIS — I2510 Atherosclerotic heart disease of native coronary artery without angina pectoris: Secondary | ICD-10-CM | POA: Insufficient documentation

## 2017-01-11 DIAGNOSIS — D380 Neoplasm of uncertain behavior of larynx: Secondary | ICD-10-CM | POA: Diagnosis not present

## 2017-01-11 DIAGNOSIS — Z7982 Long term (current) use of aspirin: Secondary | ICD-10-CM | POA: Insufficient documentation

## 2017-01-11 DIAGNOSIS — Z85820 Personal history of malignant melanoma of skin: Secondary | ICD-10-CM | POA: Insufficient documentation

## 2017-01-11 DIAGNOSIS — I739 Peripheral vascular disease, unspecified: Secondary | ICD-10-CM | POA: Insufficient documentation

## 2017-01-11 DIAGNOSIS — Z881 Allergy status to other antibiotic agents status: Secondary | ICD-10-CM | POA: Insufficient documentation

## 2017-01-11 DIAGNOSIS — I10 Essential (primary) hypertension: Secondary | ICD-10-CM | POA: Insufficient documentation

## 2017-01-11 DIAGNOSIS — Z8521 Personal history of malignant neoplasm of larynx: Secondary | ICD-10-CM | POA: Insufficient documentation

## 2017-01-11 DIAGNOSIS — Z7902 Long term (current) use of antithrombotics/antiplatelets: Secondary | ICD-10-CM | POA: Insufficient documentation

## 2017-01-11 DIAGNOSIS — Z888 Allergy status to other drugs, medicaments and biological substances status: Secondary | ICD-10-CM | POA: Insufficient documentation

## 2017-01-11 DIAGNOSIS — E669 Obesity, unspecified: Secondary | ICD-10-CM | POA: Insufficient documentation

## 2017-01-11 DIAGNOSIS — Z91041 Radiographic dye allergy status: Secondary | ICD-10-CM | POA: Insufficient documentation

## 2017-01-11 DIAGNOSIS — I252 Old myocardial infarction: Secondary | ICD-10-CM | POA: Insufficient documentation

## 2017-01-11 DIAGNOSIS — F17210 Nicotine dependence, cigarettes, uncomplicated: Secondary | ICD-10-CM | POA: Insufficient documentation

## 2017-01-11 DIAGNOSIS — E785 Hyperlipidemia, unspecified: Secondary | ICD-10-CM | POA: Insufficient documentation

## 2017-01-11 HISTORY — PX: MICROLARYNGOSCOPY WITH CO2 LASER AND EXCISION OF VOCAL CORD LESION: SHX5970

## 2017-01-11 SURGERY — MICROLARYNGOSCOPY WITH CO2 LASER AND EXCISION OF VOCAL CORD LESION
Anesthesia: General | Site: Mouth

## 2017-01-11 MED ORDER — FENTANYL CITRATE (PF) 100 MCG/2ML IJ SOLN
INTRAMUSCULAR | Status: AC
  Filled 2017-01-11: qty 2

## 2017-01-11 MED ORDER — FENTANYL CITRATE (PF) 100 MCG/2ML IJ SOLN: 25.0000 ug | INTRAMUSCULAR | Status: DC | PRN

## 2017-01-11 MED ORDER — EPINEPHRINE PF 1 MG/ML IJ SOLN
INTRAMUSCULAR | Status: DC | PRN
  Administered 2017-01-11: 1 mg via ENDOTRACHEOPULMONARY

## 2017-01-11 MED ORDER — OXYCODONE HCL 5 MG PO TABS: 5.0000 mg | ORAL_TABLET | Freq: Once | ORAL | Status: DC | PRN

## 2017-01-11 MED ORDER — ROCURONIUM BROMIDE 100 MG/10ML IV SOLN
INTRAVENOUS | Status: DC | PRN
Start: 2017-01-11 — End: 2017-01-11
  Administered 2017-01-11: 40 mg via INTRAVENOUS

## 2017-01-11 MED ORDER — SUGAMMADEX SODIUM 200 MG/2ML IV SOLN
INTRAVENOUS | Status: AC
  Filled 2017-01-11: qty 2

## 2017-01-11 MED ORDER — METOPROLOL TARTARATE 1 MG/ML SYRINGE (5ML)
Status: DC | PRN
  Administered 2017-01-11: 2.5 mg via INTRAVENOUS

## 2017-01-11 MED ORDER — MIDAZOLAM HCL 2 MG/2ML IJ SOLN
INTRAMUSCULAR | Status: AC
  Filled 2017-01-11: qty 2

## 2017-01-11 MED ORDER — FENTANYL CITRATE (PF) 100 MCG/2ML IJ SOLN
INTRAMUSCULAR | Status: DC | PRN
  Administered 2017-01-11 (×2): 50 ug via INTRAVENOUS

## 2017-01-11 MED ORDER — OXYCODONE HCL 5 MG PO TABS: 5.0000 mg | ORAL_TABLET | Freq: Once | ORAL | 0 refills | Status: DC | PRN

## 2017-01-11 MED ORDER — EPINEPHRINE HCL (NASAL) 0.1 % NA SOLN
NASAL | Status: AC
  Filled 2017-01-11: qty 30

## 2017-01-11 MED ORDER — LIDOCAINE HCL (CARDIAC) 20 MG/ML IV SOLN
INTRAVENOUS | Status: DC | PRN
  Administered 2017-01-11: 100 mg via INTRATRACHEAL

## 2017-01-11 MED ORDER — METOPROLOL TARTRATE 5 MG/5ML IV SOLN
INTRAVENOUS | Status: AC
  Filled 2017-01-11: qty 5

## 2017-01-11 MED ORDER — EPINEPHRINE PF 1 MG/ML IJ SOLN
INTRAMUSCULAR | Status: AC
  Filled 2017-01-11: qty 1

## 2017-01-11 MED ORDER — GLYCOPYRROLATE 0.2 MG/ML IJ SOLN
INTRAMUSCULAR | Status: DC | PRN
  Administered 2017-01-11: 0.2 mg via INTRAVENOUS

## 2017-01-11 MED ORDER — ONDANSETRON HCL 4 MG/2ML IJ SOLN
INTRAMUSCULAR | Status: DC | PRN
  Administered 2017-01-11: 4 mg via INTRAVENOUS

## 2017-01-11 MED ORDER — PROMETHAZINE HCL 25 MG/ML IJ SOLN: 6.2500 mg | INTRAMUSCULAR | Status: DC | PRN

## 2017-01-11 MED ORDER — MIDAZOLAM HCL 2 MG/2ML IJ SOLN
INTRAMUSCULAR | Status: DC | PRN
  Administered 2017-01-11 (×2): 1 mg via INTRAVENOUS

## 2017-01-11 MED ORDER — LACTATED RINGERS IV SOLN
INTRAVENOUS | Status: DC | PRN
  Administered 2017-01-11 (×2): via INTRAVENOUS

## 2017-01-11 MED ORDER — OXYCODONE HCL 5 MG/5ML PO SOLN: 5.0000 mg | Freq: Once | ORAL | Status: DC | PRN

## 2017-01-11 MED ORDER — ONDANSETRON HCL 4 MG/2ML IJ SOLN
INTRAMUSCULAR | Status: AC
  Filled 2017-01-11: qty 2

## 2017-01-11 MED ORDER — SUGAMMADEX SODIUM 200 MG/2ML IV SOLN
INTRAVENOUS | Status: DC | PRN
  Administered 2017-01-11: 200 mg via INTRAVENOUS

## 2017-01-11 MED ORDER — PROPOFOL 10 MG/ML IV BOLUS
INTRAVENOUS | Status: AC
  Filled 2017-01-11: qty 20

## 2017-01-11 MED ORDER — PROPOFOL 10 MG/ML IV BOLUS
INTRAVENOUS | Status: DC | PRN
  Administered 2017-01-11: 150 mg via INTRAVENOUS
  Administered 2017-01-11: 50 mg via INTRAVENOUS

## 2017-01-11 MED ORDER — ARTIFICIAL TEARS OP OINT
TOPICAL_OINTMENT | OPHTHALMIC | Status: DC | PRN
  Administered 2017-01-11: 1 via OPHTHALMIC

## 2017-01-11 MED ORDER — MEPERIDINE HCL 25 MG/ML IJ SOLN: 6.2500 mg | INTRAMUSCULAR | Status: DC | PRN

## 2017-01-11 MED ORDER — LIDOCAINE 2% (20 MG/ML) 5 ML SYRINGE
INTRAMUSCULAR | Status: AC
  Filled 2017-01-11: qty 5

## 2017-01-11 MED ORDER — ARTIFICIAL TEARS OP OINT
TOPICAL_OINTMENT | OPHTHALMIC | Status: AC
  Filled 2017-01-11: qty 3.5

## 2017-01-11 SURGICAL SUPPLY — 30 items
BANDAGE EYE OVAL (MISCELLANEOUS) ×2 IMPLANT
BLADE SURG 10 STRL SS (BLADE) ×2 IMPLANT
BLADE SURG 15 STRL LF DISP TIS (BLADE) IMPLANT
BLADE SURG 15 STRL SS (BLADE)
CANISTER SUCTION 1200CC (MISCELLANEOUS) ×2 IMPLANT
CONT SPECI 4OZ STER CLIK (MISCELLANEOUS) ×2 IMPLANT
COVER BACK TABLE 60X90IN (DRAPES) ×2 IMPLANT
COVER MAYO STAND STRL (DRAPES) ×2 IMPLANT
DEPRESSOR TONGUE BLADE STERILE (MISCELLANEOUS) ×2 IMPLANT
DRAPE PROXIMA HALF (DRAPES) ×2 IMPLANT
GAUZE SPONGE 4X4 12PLY STRL (GAUZE/BANDAGES/DRESSINGS) IMPLANT
GLOVE ECLIPSE 7.5 STRL STRAW (GLOVE) ×2 IMPLANT
GOWN STRL REUS W/ TWL XL LVL3 (GOWN DISPOSABLE) IMPLANT
GOWN STRL REUS W/TWL XL LVL3 (GOWN DISPOSABLE)
GUARD TEETH (MISCELLANEOUS) IMPLANT
KIT BASIN OR (CUSTOM PROCEDURE TRAY) ×2 IMPLANT
NEEDLE HYPO 18GX1.5 BLUNT FILL (NEEDLE) IMPLANT
NEEDLE HYPO 25GX1X1/2 BEV (NEEDLE) IMPLANT
NEEDLE SPNL 22GX3.5 QUINCKE BK (NEEDLE) IMPLANT
NS IRRIG 1000ML POUR BTL (IV SOLUTION) ×2 IMPLANT
PAD EYE OVAL STERILE LF (GAUZE/BANDAGES/DRESSINGS) ×4 IMPLANT
PATTIES SURGICAL .5 X3 (DISPOSABLE) IMPLANT
SOLUTION ANTI FOG 6CC (MISCELLANEOUS) ×2 IMPLANT
SOLUTION BUTLER CLEAR DIP (MISCELLANEOUS) ×2 IMPLANT
SURGILUBE 2OZ TUBE FLIPTOP (MISCELLANEOUS) IMPLANT
SYR 5ML LL (SYRINGE) ×2 IMPLANT
SYR CONTROL 10ML LL (SYRINGE) IMPLANT
TOWEL OR 17X24 6PK STRL BLUE (TOWEL DISPOSABLE) ×2 IMPLANT
TUBE CONNECTING 20X1/4 (TUBING) ×2 IMPLANT
WATER STERILE IRR 1000ML POUR (IV SOLUTION) ×2 IMPLANT

## 2017-01-11 NOTE — Anesthesia Procedure Notes (Signed)
Procedure Name: Intubation Date/Time: 01/11/2017 1:52 PM Performed by: Nolon Nations Pre-anesthesia Checklist: Patient identified, Emergency Drugs available, Suction available and Patient being monitored Patient Re-evaluated:Patient Re-evaluated prior to inductionOxygen Delivery Method: Circle system utilized Preoxygenation: Pre-oxygenation with 100% oxygen Intubation Type: IV induction Ventilation: Oral airway inserted - appropriate to patient size Laryngoscope Size: Mac and 3 Grade View: Grade I Tube type: Oral (laser) Laser Tube: Laser Tube Tube size: 6.0 mm Number of attempts: 1 Placement Confirmation: ETT inserted through vocal cords under direct vision,  positive ETCO2 and breath sounds checked- equal and bilateral Tube secured with: Tape Dental Injury: Teeth and Oropharynx as per pre-operative assessment

## 2017-01-11 NOTE — Op Note (Signed)
Preop/postop diagnosis: Left vocal CORD lesion Procedure: Microlaryngoscopy with biopsy and laser Anesthesia: Gen. Estimated blood loss: Less than 5 mL Indications: 66 year old previous biopsy with squamous cell carcinoma in situ. 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 He is informed risk and benefits of the procedure and options were discussed AND transferring consent was obtained. Procedure: Patient was taken to the operating room placed in the supine position after general endotracheal tube anesthesia with laser tube he was draped in usual sterile manner. The Dedo scope was inserted and the larynx was examined. The left vocal cord leukoplakia was easily identified which extended almost to the left anterior commissure. The pledget was placed into the area and then a biopsy was taken from the anterior cord region of the leukoplakia. The patient's eyes were covered with moist eye guard and then wrapped with a wet towel. The laser was set on 4 W diffused and the moist pledget was placed into the subglottis protecting the region. The left vocal cord anteriorly was lasered with the 4 watt energy and all of the white material was removed. The anterior commissure was not lasered. There was no white debris in that precise location. The eschar was removed with suction. There was good hemostasis. The pledget was removed and the subglottis was normal. The scope was removed and the patient was awakened brought to recovery in stable condition counts correct

## 2017-01-11 NOTE — Transfer of Care (Signed)
Immediate Anesthesia Transfer of Care Note  Patient: Don [name]  Procedure(s) Performed: Procedure(s): MICROLARYNGOSCOPY WITH CO2 LASER AND EXCISION OF VOCAL CORD LESION (N/A)  Patient Location: PACU  Anesthesia Type:General  Level of Consciousness: awake, alert  and pateint uncooperative  Airway & Oxygen Therapy: Patient Spontanous Breathing and Patient connected to face mask oxygen  Post-op Assessment: Report given to RN, Post -op Vital signs reviewed and stable, Patient moving all extremities X 4 and Patient able to stick tongue midline  Post vital signs: Reviewed and stable  Last Vitals:  Vitals:   01/11/17 1120 01/11/17 1436  BP:  (!) 143/97  Pulse:  88  Resp: 20 (!) 21  Temp:  36.5 C    Last Pain: There were no vitals filed for this visit.    Patients Stated Pain Goal: 4 ([id number])  Complications: No apparent anesthesia complications

## 2017-01-11 NOTE — H&P (Signed)
Don [name] is an 66 y.o. male.   Chief Complaint: vocal cord lesion HPI: hx of left TVC leukplakia and now for laser  Past Medical History:  Diagnosis Date  . Arthritis   . Basal cell carcinoma   . Cancer ([place])    throat - 1997, throat - 2018  . COPD (chronic obstructive pulmonary disease) ([place])   . Coronary artery disease   . GERD (gastroesophageal reflux disease)   . Hyperlipidemia   . Hypertension   . Lesion of vocal cord   . Melanoma of back ([place])    melanoma on back  . Myocardial infarction 2000   2 stents  . Peripheral vascular disease ([facility])    iliac artery clot  . Pneumonia 02/2015  . Wears glasses     Past Surgical History:  Procedure Laterality Date  . ABDOMINAL ANGIOGRAM N/A 01/13/2015   Procedure: ABDOMINAL ANGIOGRAM;  Surgeon: Rosetta Posner, MD;  [facility];  Service: Cardiovascular;  Laterality: N/A;  . APPENDECTOMY    . BACK SURGERY    . CARDIAC CATHETERIZATION  2000/2012   with stents in 2000  . CHOLECYSTECTOMY    . COLONOSCOPY    . ELBOW SURGERY     bilateral  . ESOPHAGOGASTRODUODENOSCOPY    . KNEE SURGERY Left   . MICROLARYNGOSCOPY WITH CO2 LASER AND EXCISION OF VOCAL CORD LESION N/A 11/23/2016   Procedure: MICROLARYNGOSCOPY  AND EXCISION OF VOCAL CORD LESION;  Surgeon: Melissa Montane, MD;  Location: [place];  Service: ENT;  Laterality: N/A;  . MICROLARYNGOSCOPY WITH LASER N/A 03/16/2015   Procedure: MICROLARYNGOSCOPY ;  Surgeon: Melissa Montane, MD;  Location: [place];  Service: ENT;  Laterality: N/A;  . peyronie's surgery    . SHOULDER SURGERY Right    rotator cuff  . THROAT SURGERY  1997   cancer removed    Family History  Problem Relation Age of Onset  . Cancer Mother     bone  . Stroke Father    Social History:  reports that he has been smoking E-cigarettes and Cigarettes.  He has never used smokeless tobacco. He reports that he does not drink alcohol or use drugs.  Allergies:  Allergies  Allergen Reactions  . Prednisone    Depressed mood - can tolerate it, but it makes him very irritable  . Contrast Media [Iodinated Diagnostic Agents] Rash    Also developed blisters  . Doxycycline Rash  . Penicillins Rash    Has patient had a PCN reaction causing immediate rash, facial/tongue/throat swelling, SOB or lightheadedness with hypotension:unsure Has patient had a PCN reaction causing severe rash involving mucus membranes or skin necrosis:unsure Has patient had a PCN reaction that required hospitalization:No Has patient had a PCN reaction occurring within the last 10 years:No If all of the above answers are "NO", then may proceed with Cephalosporin use.     Medications Prior to Admission  Medication Sig Dispense Refill  . ALPRAZolam (XANAX) 0.25 MG tablet Take 0.25 mg by mouth 2 (two) times daily as needed for anxiety.     Marland Kitchen aspirin 81 MG tablet Take 162 mg by mouth daily.     . clopidogrel (PLAVIX) 75 MG tablet TAKE ONE TABLET BY MOUTH ONCE DAILY 30 tablet 10  . cyclobenzaprine (FLEXERIL) 10 MG tablet Take 10 mg by mouth 2 (two) times daily as needed for muscle spasms.     . DULoxetine (CYMBALTA) 60 MG capsule Take 60 mg by mouth 2 (two) times daily.     Marland Kitchen  ipratropium-albuterol (DUONEB) 0.5-2.5 (3) MG/3ML SOLN Take 3 mLs by nebulization every 6 (six) hours as needed (for shortness of breath/wheezing. (with bronchitis)).    . Magnesium 250 MG TABS Take 250 mg by mouth daily.    . metoprolol (LOPRESSOR) 50 MG tablet Take 50 mg by mouth 2 (two) times daily.     . Multiple Vitamins-Minerals (PRESERVISION AREDS 2 PO) Take 2 tablets by mouth daily.    Marland Kitchen omeprazole (PRILOSEC) 20 MG capsule Take 20 mg by mouth daily.    . simvastatin (ZOCOR) 40 MG tablet Take 40 mg by mouth at bedtime.     . traZODone (DESYREL) 150 MG tablet Take 150 mg by mouth at bedtime.    Marland Kitchen HYDROcodone-acetaminophen (LORTAB) 5-325 MG tablet Take 1 tablet by mouth every 6 (six) hours as needed for moderate pain. (Patient not taking: Reported on  01/04/2017) 30 tablet 0    Results for orders placed or performed during the hospital encounter of 01/10/17 (from the past 48 hour(s))  Basic metabolic panel     Status: Abnormal   Collection Time: 01/10/17  8:41 AM  Result Value Ref Range   Sodium 140 135 - 145 mmol/L   Potassium 4.2 3.5 - 5.1 mmol/L   Chloride 106 101 - 111 mmol/L   CO2 27 22 - 32 mmol/L   Glucose, Bld 125 (H) 65 - 99 mg/dL   BUN 16 6 - 20 mg/dL   Creatinine, Ser 0.98 0.61 - 1.24 mg/dL   Calcium 9.1 8.9 - 10.3 mg/dL   GFR calc non Af Amer >60 >60 mL/min   GFR calc Af Amer >60 >60 mL/min    Comment: (NOTE) The eGFR has been calculated using the CKD EPI equation. This calculation has not been validated in all clinical situations. eGFR's persistently <60 mL/min signify possible Chronic Kidney Disease.    Anion gap 7 5 - 15  CBC     Status: Abnormal   Collection Time: 01/10/17  8:41 AM  Result Value Ref Range   WBC 12.2 (H) 4.0 - 10.5 K/uL   RBC 4.73 4.22 - 5.81 MIL/uL   Hemoglobin 15.0 13.0 - 17.0 g/dL   HCT 43.4 39.0 - 52.0 %   MCV 91.8 78.0 - 100.0 fL   MCH 31.7 26.0 - 34.0 pg   MCHC 34.6 30.0 - 36.0 g/dL   RDW 13.4 11.5 - 15.5 %   Platelets 195 150 - 400 K/uL   No results found.  Review of Systems  Constitutional: Negative.   HENT: Negative.   Eyes: Negative.   Respiratory: Negative.   Cardiovascular: Negative.   Skin: Negative.     Blood pressure (!) 154/87, pulse 91, temperature 98.7 F (37.1 C), resp. rate 20, height _0  (1.753 m), weight 93.9 kg (207 lb), SpO2 100 %. Physical Exam  Constitutional: He appears well-developed and well-nourished.  HENT:  Nose: Nose normal.  Eyes: Conjunctivae are normal. Pupils are equal, round, and reactive to light.  Neck: Normal range of motion. Neck supple.  Cardiovascular: Normal rate.   Respiratory: Effort normal.  GI: Soft.  Musculoskeletal: Normal range of motion.     Assessment/Plan Vocal cord dysplasia- discussed the microlaryngoscopy and  ready to proceed  Melissa Montane, MD 01/11/2017, 1:22 PM

## 2017-01-11 NOTE — Discharge Instructions (Signed)
Voice will be hoarse for approximately several weeks and practice voice rest for 1 week. Regular diet should be appropriate and tolerated. Call if there is any increasing pain, bleeding, or any breathing problems. Follow-up in approximately 3 weeks and call the office for appointment.

## 2017-01-11 NOTE — Anesthesia Preprocedure Evaluation (Signed)
Anesthesia Evaluation  Patient identified by MRN, date of birth, ID band Patient awake    Reviewed: Allergy & Precautions, NPO status , Patient's Chart, lab work & pertinent test results, reviewed documented beta blocker date and time   Airway Mallampati: II  TM Distance: >3 FB Neck ROM: full    Dental no notable dental hx.    Pulmonary COPD, Current Smoker,    Pulmonary exam normal        Cardiovascular hypertension, Pt. on medications and Pt. on home beta blockers + CAD, + Past MI, + Cardiac Stents and + Peripheral Vascular Disease  Normal cardiovascular exam Rhythm:Regular Rate:Normal     Neuro/Psych PSYCHIATRIC DISORDERS Anxiety Depression negative neurological ROS     GI/Hepatic GERD  Medicated and Controlled,  Endo/Other    Renal/GU   negative genitourinary   Musculoskeletal  (+) Arthritis ,   Abdominal (+) + obese,   Peds  Hematology   Anesthesia Other Findings   Reproductive/Obstetrics negative OB ROS                             Anesthesia Physical  Anesthesia Plan  ASA: III  Anesthesia Plan: General   Post-op Pain Management:    Induction: Intravenous  Airway Management Planned: Oral ETT  Additional Equipment:   Intra-op Plan:   Post-operative Plan: Extubation in OR  Informed Consent: I have reviewed the patients History and Physical, chart, labs and discussed the procedure including the risks, benefits and alternatives for the proposed anesthesia with the patient or authorized representative who has indicated his/her understanding and acceptance.   Dental advisory given  Plan Discussed with: CRNA  Anesthesia Plan Comments:         Anesthesia Quick Evaluation

## 2017-01-12 ENCOUNTER — Encounter (HOSPITAL_COMMUNITY): Payer: Self-pay | Admitting: Otolaryngology

## 2017-01-12 NOTE — Anesthesia Postprocedure Evaluation (Signed)
Anesthesia Post Note  Patient: Don [name]  Procedure(s) Performed: Procedure(s) (LRB): MICROLARYNGOSCOPY WITH CO2 LASER AND EXCISION OF VOCAL CORD LESION (N/A)  Patient location during evaluation: PACU Anesthesia Type: General Level of consciousness: sedated and patient cooperative Pain management: pain level controlled Vital Signs Assessment: post-procedure vital signs reviewed and stable Respiratory status: spontaneous breathing Cardiovascular status: stable Anesthetic complications: no       Last Vitals:  Vitals:   01/11/17 1515 01/11/17 1520  BP: 129/71 134/78  Pulse: 73 76  Resp: (!) 22 19  Temp:  36.6 C    Last Pain:  Vitals:   01/11/17 1520  PainSc: 0-No pain                 Nolon Nations

## 2017-02-06 ENCOUNTER — Other Ambulatory Visit: Payer: Self-pay | Admitting: Surgery

## 2017-03-12 DIAGNOSIS — M5136 Other intervertebral disc degeneration, lumbar region: Secondary | ICD-10-CM | POA: Diagnosis not present

## 2017-03-12 DIAGNOSIS — M545 Low back pain: Secondary | ICD-10-CM | POA: Diagnosis not present

## 2017-03-12 DIAGNOSIS — Z6829 Body mass index (BMI) 29.0-29.9, adult: Secondary | ICD-10-CM | POA: Diagnosis not present

## 2017-03-12 DIAGNOSIS — I10 Essential (primary) hypertension: Secondary | ICD-10-CM | POA: Diagnosis not present

## 2017-04-11 DIAGNOSIS — M48062 Spinal stenosis, lumbar region with neurogenic claudication: Secondary | ICD-10-CM | POA: Diagnosis not present

## 2017-04-16 DIAGNOSIS — M545 Low back pain: Secondary | ICD-10-CM | POA: Diagnosis not present

## 2017-04-17 DIAGNOSIS — M48062 Spinal stenosis, lumbar region with neurogenic claudication: Secondary | ICD-10-CM | POA: Diagnosis not present

## 2017-04-20 NOTE — Addendum Note (Signed)
Addendum  created 04/20/17 0913 by Lyn Hollingshead, MD   Sign clinical note

## 2017-04-23 DIAGNOSIS — E782 Mixed hyperlipidemia: Secondary | ICD-10-CM | POA: Diagnosis not present

## 2017-04-23 DIAGNOSIS — I10 Essential (primary) hypertension: Secondary | ICD-10-CM | POA: Diagnosis not present

## 2017-04-23 DIAGNOSIS — F321 Major depressive disorder, single episode, moderate: Secondary | ICD-10-CM | POA: Diagnosis not present

## 2017-04-23 DIAGNOSIS — J410 Simple chronic bronchitis: Secondary | ICD-10-CM | POA: Diagnosis not present

## 2017-04-24 DIAGNOSIS — M25512 Pain in left shoulder: Secondary | ICD-10-CM | POA: Diagnosis not present

## 2017-04-24 DIAGNOSIS — M7542 Impingement syndrome of left shoulder: Secondary | ICD-10-CM | POA: Diagnosis not present

## 2017-05-01 DIAGNOSIS — M47817 Spondylosis without myelopathy or radiculopathy, lumbosacral region: Secondary | ICD-10-CM | POA: Diagnosis not present

## 2017-05-22 DIAGNOSIS — E782 Mixed hyperlipidemia: Secondary | ICD-10-CM | POA: Diagnosis not present

## 2017-05-22 DIAGNOSIS — M25542 Pain in joints of left hand: Secondary | ICD-10-CM | POA: Diagnosis not present

## 2017-05-22 DIAGNOSIS — I10 Essential (primary) hypertension: Secondary | ICD-10-CM | POA: Diagnosis not present

## 2017-05-28 DIAGNOSIS — M48061 Spinal stenosis, lumbar region without neurogenic claudication: Secondary | ICD-10-CM | POA: Diagnosis not present

## 2017-05-28 DIAGNOSIS — M48062 Spinal stenosis, lumbar region with neurogenic claudication: Secondary | ICD-10-CM | POA: Diagnosis not present

## 2017-05-28 DIAGNOSIS — M47817 Spondylosis without myelopathy or radiculopathy, lumbosacral region: Secondary | ICD-10-CM | POA: Diagnosis not present

## 2017-07-05 DIAGNOSIS — R42 Dizziness and giddiness: Secondary | ICD-10-CM | POA: Diagnosis not present

## 2017-07-05 DIAGNOSIS — J4541 Moderate persistent asthma with (acute) exacerbation: Secondary | ICD-10-CM | POA: Diagnosis not present

## 2017-07-05 DIAGNOSIS — J158 Pneumonia due to other specified bacteria: Secondary | ICD-10-CM | POA: Diagnosis not present

## 2017-07-05 DIAGNOSIS — Z201 Contact with and (suspected) exposure to tuberculosis: Secondary | ICD-10-CM | POA: Diagnosis not present

## 2017-07-05 DIAGNOSIS — M791 Myalgia: Secondary | ICD-10-CM | POA: Diagnosis not present

## 2017-07-12 DIAGNOSIS — R05 Cough: Secondary | ICD-10-CM | POA: Diagnosis not present

## 2017-07-12 DIAGNOSIS — R0602 Shortness of breath: Secondary | ICD-10-CM | POA: Diagnosis not present

## 2017-08-17 DIAGNOSIS — F17210 Nicotine dependence, cigarettes, uncomplicated: Secondary | ICD-10-CM | POA: Diagnosis not present

## 2017-08-17 DIAGNOSIS — Z8521 Personal history of malignant neoplasm of larynx: Secondary | ICD-10-CM | POA: Diagnosis not present

## 2017-10-17 DIAGNOSIS — F17210 Nicotine dependence, cigarettes, uncomplicated: Secondary | ICD-10-CM | POA: Diagnosis not present

## 2017-10-17 DIAGNOSIS — J410 Simple chronic bronchitis: Secondary | ICD-10-CM | POA: Diagnosis not present

## 2017-10-17 DIAGNOSIS — R413 Other amnesia: Secondary | ICD-10-CM | POA: Diagnosis not present

## 2017-10-17 DIAGNOSIS — G3281 Cerebellar ataxia in diseases classified elsewhere: Secondary | ICD-10-CM | POA: Diagnosis not present

## 2017-10-18 DIAGNOSIS — I501 Left ventricular failure: Secondary | ICD-10-CM | POA: Diagnosis not present

## 2017-10-18 DIAGNOSIS — I519 Heart disease, unspecified: Secondary | ICD-10-CM

## 2017-10-18 DIAGNOSIS — I110 Hypertensive heart disease with heart failure: Secondary | ICD-10-CM | POA: Diagnosis not present

## 2017-10-18 DIAGNOSIS — I10 Essential (primary) hypertension: Secondary | ICD-10-CM | POA: Insufficient documentation

## 2017-10-18 DIAGNOSIS — I739 Peripheral vascular disease, unspecified: Secondary | ICD-10-CM

## 2017-10-18 DIAGNOSIS — I2510 Atherosclerotic heart disease of native coronary artery without angina pectoris: Secondary | ICD-10-CM

## 2017-10-18 HISTORY — DX: Essential (primary) hypertension: I10

## 2017-10-18 HISTORY — DX: Heart disease, unspecified: I51.9

## 2017-10-18 HISTORY — DX: Atherosclerotic heart disease of native coronary artery without angina pectoris: I25.10

## 2017-10-18 HISTORY — DX: Peripheral vascular disease, unspecified: I73.9

## 2017-10-25 DIAGNOSIS — I10 Essential (primary) hypertension: Secondary | ICD-10-CM | POA: Diagnosis not present

## 2017-10-25 DIAGNOSIS — J438 Other emphysema: Secondary | ICD-10-CM | POA: Diagnosis not present

## 2017-10-29 DIAGNOSIS — R918 Other nonspecific abnormal finding of lung field: Secondary | ICD-10-CM | POA: Diagnosis not present

## 2017-10-29 DIAGNOSIS — E782 Mixed hyperlipidemia: Secondary | ICD-10-CM | POA: Diagnosis not present

## 2017-10-29 DIAGNOSIS — R7301 Impaired fasting glucose: Secondary | ICD-10-CM | POA: Diagnosis not present

## 2017-10-29 DIAGNOSIS — R413 Other amnesia: Secondary | ICD-10-CM | POA: Diagnosis not present

## 2017-10-29 DIAGNOSIS — G3281 Cerebellar ataxia in diseases classified elsewhere: Secondary | ICD-10-CM | POA: Diagnosis not present

## 2017-11-02 DIAGNOSIS — R079 Chest pain, unspecified: Secondary | ICD-10-CM | POA: Insufficient documentation

## 2017-11-02 DIAGNOSIS — I110 Hypertensive heart disease with heart failure: Secondary | ICD-10-CM | POA: Diagnosis not present

## 2017-11-02 DIAGNOSIS — I739 Peripheral vascular disease, unspecified: Secondary | ICD-10-CM | POA: Diagnosis not present

## 2017-11-02 DIAGNOSIS — I4510 Unspecified right bundle-branch block: Secondary | ICD-10-CM | POA: Diagnosis not present

## 2017-11-02 DIAGNOSIS — I519 Heart disease, unspecified: Secondary | ICD-10-CM | POA: Diagnosis not present

## 2017-11-02 HISTORY — DX: Chest pain, unspecified: R07.9

## 2017-11-06 DIAGNOSIS — R05 Cough: Secondary | ICD-10-CM | POA: Diagnosis not present

## 2017-11-06 DIAGNOSIS — R0602 Shortness of breath: Secondary | ICD-10-CM | POA: Diagnosis not present

## 2017-11-06 DIAGNOSIS — J441 Chronic obstructive pulmonary disease with (acute) exacerbation: Secondary | ICD-10-CM | POA: Diagnosis not present

## 2017-11-06 DIAGNOSIS — F17200 Nicotine dependence, unspecified, uncomplicated: Secondary | ICD-10-CM | POA: Diagnosis not present

## 2017-11-08 DIAGNOSIS — R0902 Hypoxemia: Secondary | ICD-10-CM | POA: Diagnosis not present

## 2017-11-08 DIAGNOSIS — Z23 Encounter for immunization: Secondary | ICD-10-CM | POA: Diagnosis not present

## 2017-11-08 DIAGNOSIS — J441 Chronic obstructive pulmonary disease with (acute) exacerbation: Secondary | ICD-10-CM | POA: Diagnosis not present

## 2017-11-12 DIAGNOSIS — R079 Chest pain, unspecified: Secondary | ICD-10-CM | POA: Diagnosis not present

## 2017-11-22 ENCOUNTER — Encounter: Payer: Self-pay | Admitting: Family

## 2017-11-22 ENCOUNTER — Ambulatory Visit: Payer: Medicare Other | Admitting: Family

## 2017-11-22 ENCOUNTER — Ambulatory Visit (INDEPENDENT_AMBULATORY_CARE_PROVIDER_SITE_OTHER)
Admission: RE | Admit: 2017-11-22 | Discharge: 2017-11-22 | Disposition: A | Discharge status: Home / Self Care | Payer: Medicare Other | Source: Ambulatory Visit | Attending: Family | Admitting: Family

## 2017-11-22 ENCOUNTER — Ambulatory Visit (HOSPITAL_COMMUNITY)
Admission: RE | Admit: 2017-11-22 | Discharge: 2017-11-22 | Disposition: A | Discharge status: Home / Self Care | Payer: Medicare Other | Source: Ambulatory Visit | Attending: Family | Admitting: Family

## 2017-11-22 VITALS — BP 124/75 | HR 78 | Resp 20 | Ht 69.0 in | Wt 209.0 lb

## 2017-11-22 DIAGNOSIS — I739 Peripheral vascular disease, unspecified: Secondary | ICD-10-CM | POA: Diagnosis not present

## 2017-11-22 DIAGNOSIS — F17200 Nicotine dependence, unspecified, uncomplicated: Secondary | ICD-10-CM | POA: Diagnosis not present

## 2017-11-22 DIAGNOSIS — Z95820 Peripheral vascular angioplasty status with implants and grafts: Secondary | ICD-10-CM

## 2017-11-22 DIAGNOSIS — Z959 Presence of cardiac and vascular implant and graft, unspecified: Secondary | ICD-10-CM

## 2017-11-22 DIAGNOSIS — I70211 Atherosclerosis of native arteries of extremities with intermittent claudication, right leg: Secondary | ICD-10-CM

## 2017-11-22 DIAGNOSIS — Z95828 Presence of other vascular implants and grafts: Secondary | ICD-10-CM | POA: Insufficient documentation

## 2017-11-22 DIAGNOSIS — R9439 Abnormal result of other cardiovascular function study: Secondary | ICD-10-CM | POA: Insufficient documentation

## 2017-11-22 DIAGNOSIS — J441 Chronic obstructive pulmonary disease with (acute) exacerbation: Secondary | ICD-10-CM | POA: Diagnosis not present

## 2017-11-22 DIAGNOSIS — I70212 Atherosclerosis of native arteries of extremities with intermittent claudication, left leg: Secondary | ICD-10-CM

## 2017-11-22 NOTE — Progress Notes (Signed)
[facility] [place]   CC: Follow up peripheral artery occlusive disease  History of Present Illness Don [name] is a 67 y.o. male returns today for follow-up.  He has a history of a left common iliac stent placed in [place] in the mid 2000's.  On 01/13/2015 he underwent stenting of his left external iliac artery by Dr. Trula Slade using a 7 x 30 stent.  He continues to complain that his feet are cool.  He does not have any claudication symptoms.  He does not have open wounds.  Patient does have a history of degenerative lower back disease, he has had 3 lumbar spine surgeries.  He had surgery in his throat in 1997 and in 2018 for laryngeal cancer; states he did not receive radiation or chemo tx.  He continues to take dual antiplatelet therapy.  This is with Plavix and aspirin.  He is on a statin for hypercholesterolemia.   He states he has been slowly getting over a URI since Christmas.   He denies any claudication in his lower legs, but after walking about 25 feet, sometimes his left hip will burn, he denies radiating of pain distally, relieved by sitting.   He denies any hx of stroke or TIA.    Diabetic: No Tobacco use: smoker  (1/2 ppd, started at age 29 yrs), he is trying to quit, is taking Chantix.   Pt meds include: Statin :Yes Betablocker: Yes ASA: Yes Other anticoagulants/antiplatelets: Plavix  Past Medical History:  Diagnosis Date  . Arthritis   . Basal cell carcinoma   . Cancer ([place])    throat - 1997, throat - 2018  . COPD (chronic obstructive pulmonary disease) ([place])   . Coronary artery disease   . GERD (gastroesophageal reflux disease)   . Hyperlipidemia   . Hypertension   . Lesion of vocal cord   . Melanoma of back ([place])    melanoma on back  . Myocardial infarction ([place]) 2000   2 stents  . Peripheral vascular disease ([facility])    iliac artery clot  . Pneumonia 02/2015  . Wears glasses     Social History Social History   Tobacco Use   . Smoking status: Current Some Day Smoker    Packs/day: 0.50    Years: 50.00    Pack years: 25.00    Types: Cigarettes  . Smokeless tobacco: Never Used  Substance Use Topics  . Alcohol use: No    Alcohol/week: 0.0 oz  . Drug use: No    Family History Family History  Problem Relation Age of Onset  . Cancer Mother        bone  . Stroke Father     Past Surgical History:  Procedure Laterality Date  . ABDOMINAL ANGIOGRAM N/A 01/13/2015   Procedure: ABDOMINAL ANGIOGRAM;  Surgeon: Rosetta Posner, MD;  Location: [facility] CATH LAB;  Service: Cardiovascular;  Laterality: N/A;  . APPENDECTOMY    . BACK SURGERY    . CARDIAC CATHETERIZATION  2000/2012   with stents in 2000  . CHOLECYSTECTOMY    . COLONOSCOPY    . ELBOW SURGERY     bilateral  . ESOPHAGOGASTRODUODENOSCOPY    . KNEE SURGERY Left   . MICROLARYNGOSCOPY WITH CO2 LASER AND EXCISION OF VOCAL CORD LESION N/A 11/23/2016   Procedure: MICROLARYNGOSCOPY  AND EXCISION OF VOCAL CORD LESION;  Surgeon: Melissa Montane, MD;  Location: [place];  Service: ENT;  Laterality: N/A;  . MICROLARYNGOSCOPY WITH CO2 LASER AND EXCISION  OF VOCAL CORD LESION N/A 01/11/2017   Procedure: MICROLARYNGOSCOPY WITH CO2 LASER AND EXCISION OF VOCAL CORD LESION;  Surgeon: Melissa Montane, MD;  Location: [place];  Service: ENT;  Laterality: N/A;  . MICROLARYNGOSCOPY WITH LASER N/A 03/16/2015   Procedure: MICROLARYNGOSCOPY ;  Surgeon: Melissa Montane, MD;  Location: [place];  Service: ENT;  Laterality: N/A;  . peyronie's surgery    . SHOULDER SURGERY Right    rotator cuff  . THROAT SURGERY  1997   cancer removed    Allergies  Allergen Reactions  . Prednisone     Depressed mood - can tolerate it, but it makes him very irritable  . Contrast Media [Iodinated Diagnostic Agents] Rash    Also developed blisters  . Doxycycline Rash  . Penicillins Rash    Has patient had a PCN reaction causing immediate rash, facial/tongue/throat swelling, SOB or lightheadedness with hypotension:unsure Has  patient had a PCN reaction causing severe rash involving mucus membranes or skin necrosis:unsure Has patient had a PCN reaction that required hospitalization:No Has patient had a PCN reaction occurring within the last 10 years:No If all of the above answers are "NO", then may proceed with Cephalosporin use.     Current Outpatient Medications  Medication Sig Dispense Refill  . ALPRAZolam (XANAX) 0.25 MG tablet Take 0.25 mg by mouth 2 (two) times daily as needed for anxiety.     Marland Kitchen aspirin 81 MG tablet Take 162 mg by mouth daily.     . clopidogrel (PLAVIX) 75 MG tablet TAKE ONE TABLET BY MOUTH ONCE DAILY 30 tablet 10  . cyclobenzaprine (FLEXERIL) 10 MG tablet Take 10 mg by mouth 2 (two) times daily as needed for muscle spasms.     . DULoxetine (CYMBALTA) 60 MG capsule Take 60 mg by mouth 2 (two) times daily.     . hydrochlorothiazide (HYDRODIURIL) 25 MG tablet     . ipratropium-albuterol (DUONEB) 0.5-2.5 (3) MG/3ML SOLN Take 3 mLs by nebulization every 6 (six) hours as needed (for shortness of breath/wheezing. (with bronchitis)).    Marland Kitchen lisinopril (PRINIVIL,ZESTRIL) 10 MG tablet   1  . Magnesium 250 MG TABS Take 250 mg by mouth daily.    . metoprolol (LOPRESSOR) 50 MG tablet Take 50 mg by mouth 2 (two) times daily.     . Multiple Vitamins-Minerals (PRESERVISION AREDS 2 PO) Take 2 tablets by mouth daily.    Marland Kitchen omeprazole (PRILOSEC) 20 MG capsule Take 20 mg by mouth daily.    Marland Kitchen oxyCODONE (OXY IR/ROXICODONE) 5 MG immediate release tablet Take 1 tablet (5 mg total) by mouth once as needed (for pain score of 1-4). 30 tablet 0  . rosuvastatin (CRESTOR) 20 MG tablet   0  . traZODone (DESYREL) 150 MG tablet Take 150 mg by mouth at bedtime.    Marland Kitchen HYDROcodone-acetaminophen (LORTAB) 5-325 MG tablet Take 1 tablet by mouth every 6 (six) hours as needed for moderate pain. (Patient not taking: Reported on 01/04/2017) 30 tablet 0  . simvastatin (ZOCOR) 40 MG tablet Take 40 mg by mouth at bedtime.      No current  facility-administered medications for this visit.     ROS: See HPI for pertinent positives and negatives.   Physical Examination  Vitals:   11/22/17 1010  BP: 124/75  Pulse: 78  Resp: 20  SpO2: 95%  Weight: 209 lb (94.8 kg)  Height: 5\' 9"  (1.753 m)   Body mass index is 30.86 kg/m.  General: A&O x 3, WDWN, obese male.  Gait: normal Eyes: PERRLA. Pulmonary: Respirations are non labored, CTAB, fair air movement in all fields Cardiac: regular Rhythm, no detected murmur.         Carotid Bruits Right Left   Negative Negative   Radial pulses are 2+ palpable bilaterally   Adominal aortic pulse is not palpable                         VASCULAR EXAM: Extremities without ischemic changes, without Gangrene; without open wounds.                                                                                                          LE Pulses Right Left       FEMORAL  1+ palpable  1+ palpable        POPLITEAL  not palpable   not palpable       POSTERIOR TIBIAL  2+ palpable   2+ palpable        DORSALIS PEDIS      ANTERIOR TIBIAL not palpable  1+ palpable    Abdomen: soft, NT, no masses. Skin: no rashes, no ulcers noted. Musculoskeletal: no muscle wasting or atrophy.  Neurologic: A&O X 3; Appropriate Affect ; SENSATION: normal; MOTOR FUNCTION:  moving all extremities equally, motor strength 5/5 throughout. Speech is fluent/normal. CN 2-12 intact. Psychiatric: Mood appropriate for clinical situation.     ASSESSMENT: RHEN KAWECKI is a 67 y.o. male who is s/p  left common iliac stent placed in [place] in the mid 2000's.  On 01/13/2015 he underwent stenting of his left external iliac artery. He has no claudication sx's, no signs of ischemia in his feet or legs. Marland Kitchen   DATA  Left aortoiliac Duplex (11/22/17): Difficult study due to persistent pt cough and body habitus. Patent left common iliac and left external iliac stents with no restenosis.  Monophasic and triphasic  waveforms.  No significant change compared to the exam on 11-20-16.    ABI (Date: 11/22/2017):  R:   ABI: 0.96 (was 1.19 on 11-20-16),   PT: tri  DP: tri  TBI:  0.66  L:   ABI: 0.82 (was 1.04),   PT: tri  DP: tri  TBI: 0.56 Stable and normal on the right, slight decline in the left ABI to mild disease, all triphasic waveforms bilaterally.   PLAN:  The patient was counseled re smoking cessation and given several free resources re smoking cessation.   Based on the patient's vascular studies and examination, pt will return to clinic in 1 year with ABI's and left aortoiliac duplex.  I advised him to notify us if he develops concerns re the circulation in his feet or legs.   I discussed in depth with the patient the nature of atherosclerosis, and emphasized the importance of maximal medical management including strict control of blood pressure, blood glucose, and lipid levels, obtaining regular exercise, and cessation of smoking.  The patient is aware that without maximal medical management the underlying atherosclerotic disease process will progress, limiting the  benefit of any interventions.  The patient was given information about PAD including signs, symptoms, treatment, what symptoms should prompt the patient to seek immediate medical care, and risk reduction measures to take.  Clemon Chambers, RN, MSN, FNP-C [facility] Phone: [phone]  Clinic MD: [facility]  11/22/17 10:26 AM

## 2017-11-22 NOTE — Patient Instructions (Signed)
Steps to Quit Smoking Smoking tobacco can be bad for your health. It can also affect almost every organ in your body. Smoking puts you and people around you at risk for many serious long-lasting (chronic) diseases. Quitting smoking is hard, but it is one of the best things that you can do for your health. It is never too late to quit. What are the benefits of quitting smoking? When you quit smoking, you lower your risk for getting serious diseases and conditions. They can include:  Lung cancer or lung disease.  Heart disease.  Stroke.  Heart attack.  Not being able to have children (infertility).  Weak bones (osteoporosis) and broken bones (fractures).  If you have coughing, wheezing, and shortness of breath, those symptoms may get better when you quit. You may also get sick less often. If you are pregnant, quitting smoking can help to lower your chances of having a baby of low birth weight. What can I do to help me quit smoking? Talk with your doctor about what can help you quit smoking. Some things you can do (strategies) include:  Quitting smoking totally, instead of slowly cutting back how much you smoke over a period of time.  Going to in-person counseling. You are more likely to quit if you go to many counseling sessions.  Using resources and support systems, such as: ? Online chats with a counselor. ? Phone quitlines. ? Printed self-help materials. ? Support groups or group counseling. ? Text messaging programs. ? Mobile phone apps or applications.  Taking medicines. Some of these medicines may have nicotine in them. If you are pregnant or breastfeeding, do not take any medicines to quit smoking unless your doctor says it is okay. Talk with your doctor about counseling or other things that can help you.  Talk with your doctor about using more than one strategy at the same time, such as taking medicines while you are also going to in-person counseling. This can help make  quitting easier. What things can I do to make it easier to quit? Quitting smoking might feel very hard at first, but there is a lot that you can do to make it easier. Take these steps:  Talk to your family and friends. Ask them to support and encourage you.  Call phone quitlines, reach out to support groups, or work with a counselor.  Ask people who smoke to not smoke around you.  Avoid places that make you want (trigger) to smoke, such as: ? Bars. ? Parties. ? Smoke-break areas at work.  Spend time with people who do not smoke.  Lower the stress in your life. Stress can make you want to smoke. Try these things to help your stress: ? Getting regular exercise. ? Deep-breathing exercises. ? Yoga. ? Meditating. ? Doing a body scan. To do this, close your eyes, focus on one area of your body at a time from head to toe, and notice which parts of your body are tense. Try to relax the muscles in those areas.  Download or buy apps on your mobile phone or tablet that can help you stick to your quit plan. There are many free apps, such as QuitGuide from the CDC (Centers for Disease Control and Prevention). You can find more support from smokefree.gov and other websites.  This information is not intended to replace advice given to you by your health care provider. Make sure you discuss any questions you have with your health care provider. Document Released: 08/26/2009 Document   Revised: 06/27/2016 Document Reviewed: 03/16/2015 Elsevier Interactive Patient Education  2018 Elsevier Inc.  

## 2017-11-23 DIAGNOSIS — J441 Chronic obstructive pulmonary disease with (acute) exacerbation: Secondary | ICD-10-CM | POA: Diagnosis not present

## 2017-12-03 DIAGNOSIS — L219 Seborrheic dermatitis, unspecified: Secondary | ICD-10-CM | POA: Diagnosis not present

## 2017-12-03 DIAGNOSIS — Z85820 Personal history of malignant melanoma of skin: Secondary | ICD-10-CM | POA: Diagnosis not present

## 2017-12-03 DIAGNOSIS — D519 Vitamin B12 deficiency anemia, unspecified: Secondary | ICD-10-CM | POA: Diagnosis not present

## 2017-12-03 DIAGNOSIS — R233 Spontaneous ecchymoses: Secondary | ICD-10-CM | POA: Diagnosis not present

## 2017-12-03 DIAGNOSIS — L821 Other seborrheic keratosis: Secondary | ICD-10-CM | POA: Diagnosis not present

## 2017-12-07 DIAGNOSIS — C44319 Basal cell carcinoma of skin of other parts of face: Secondary | ICD-10-CM | POA: Diagnosis not present

## 2017-12-18 DIAGNOSIS — R1084 Generalized abdominal pain: Secondary | ICD-10-CM | POA: Diagnosis not present

## 2017-12-18 DIAGNOSIS — R109 Unspecified abdominal pain: Secondary | ICD-10-CM | POA: Diagnosis not present

## 2017-12-24 DIAGNOSIS — C4441 Basal cell carcinoma of skin of scalp and neck: Secondary | ICD-10-CM | POA: Diagnosis not present

## 2018-01-15 DIAGNOSIS — N3000 Acute cystitis without hematuria: Secondary | ICD-10-CM | POA: Diagnosis not present

## 2018-01-15 DIAGNOSIS — N50811 Right testicular pain: Secondary | ICD-10-CM | POA: Diagnosis not present

## 2018-01-28 DIAGNOSIS — R10816 Epigastric abdominal tenderness: Secondary | ICD-10-CM | POA: Diagnosis not present

## 2018-01-28 DIAGNOSIS — E782 Mixed hyperlipidemia: Secondary | ICD-10-CM | POA: Diagnosis not present

## 2018-01-28 DIAGNOSIS — J410 Simple chronic bronchitis: Secondary | ICD-10-CM | POA: Diagnosis not present

## 2018-01-28 DIAGNOSIS — E1165 Type 2 diabetes mellitus with hyperglycemia: Secondary | ICD-10-CM | POA: Diagnosis not present

## 2018-01-29 DIAGNOSIS — R1310 Dysphagia, unspecified: Secondary | ICD-10-CM | POA: Diagnosis not present

## 2018-01-29 DIAGNOSIS — R1084 Generalized abdominal pain: Secondary | ICD-10-CM | POA: Diagnosis not present

## 2018-02-01 DIAGNOSIS — D72829 Elevated white blood cell count, unspecified: Secondary | ICD-10-CM | POA: Diagnosis not present

## 2018-02-04 DIAGNOSIS — K209 Esophagitis, unspecified: Secondary | ICD-10-CM | POA: Diagnosis not present

## 2018-02-04 DIAGNOSIS — J410 Simple chronic bronchitis: Secondary | ICD-10-CM | POA: Diagnosis not present

## 2018-02-04 DIAGNOSIS — E1165 Type 2 diabetes mellitus with hyperglycemia: Secondary | ICD-10-CM | POA: Diagnosis not present

## 2018-02-04 DIAGNOSIS — R10816 Epigastric abdominal tenderness: Secondary | ICD-10-CM | POA: Diagnosis not present

## 2018-02-04 DIAGNOSIS — D72829 Elevated white blood cell count, unspecified: Secondary | ICD-10-CM | POA: Diagnosis not present

## 2018-02-04 DIAGNOSIS — K219 Gastro-esophageal reflux disease without esophagitis: Secondary | ICD-10-CM | POA: Diagnosis not present

## 2018-02-04 DIAGNOSIS — F17210 Nicotine dependence, cigarettes, uncomplicated: Secondary | ICD-10-CM | POA: Diagnosis not present

## 2018-02-04 DIAGNOSIS — R1310 Dysphagia, unspecified: Secondary | ICD-10-CM | POA: Diagnosis not present

## 2018-02-04 DIAGNOSIS — R1084 Generalized abdominal pain: Secondary | ICD-10-CM | POA: Diagnosis not present

## 2018-02-09 DIAGNOSIS — E1165 Type 2 diabetes mellitus with hyperglycemia: Secondary | ICD-10-CM | POA: Diagnosis not present

## 2018-02-11 ENCOUNTER — Other Ambulatory Visit: Payer: Self-pay | Admitting: Vascular Surgery

## 2018-02-13 ENCOUNTER — Other Ambulatory Visit: Payer: Self-pay | Admitting: Vascular Surgery

## 2018-02-13 DIAGNOSIS — G603 Idiopathic progressive neuropathy: Secondary | ICD-10-CM | POA: Diagnosis not present

## 2018-02-13 DIAGNOSIS — R202 Paresthesia of skin: Secondary | ICD-10-CM | POA: Diagnosis not present

## 2018-02-13 DIAGNOSIS — M5412 Radiculopathy, cervical region: Secondary | ICD-10-CM | POA: Diagnosis not present

## 2018-02-13 DIAGNOSIS — M5417 Radiculopathy, lumbosacral region: Secondary | ICD-10-CM | POA: Diagnosis not present

## 2018-02-13 DIAGNOSIS — E538 Deficiency of other specified B group vitamins: Secondary | ICD-10-CM | POA: Diagnosis not present

## 2018-02-13 DIAGNOSIS — G5603 Carpal tunnel syndrome, bilateral upper limbs: Secondary | ICD-10-CM | POA: Diagnosis not present

## 2018-02-13 DIAGNOSIS — G609 Hereditary and idiopathic neuropathy, unspecified: Secondary | ICD-10-CM | POA: Diagnosis not present

## 2018-02-13 DIAGNOSIS — E531 Pyridoxine deficiency: Secondary | ICD-10-CM | POA: Diagnosis not present

## 2018-02-13 MED ORDER — CLOPIDOGREL BISULFATE 75 MG PO TABS: 75.0000 mg | ORAL_TABLET | Freq: Every day | ORAL | 10 refills | Status: DC

## 2018-03-06 DIAGNOSIS — J441 Chronic obstructive pulmonary disease with (acute) exacerbation: Secondary | ICD-10-CM | POA: Diagnosis not present

## 2018-03-06 DIAGNOSIS — R0602 Shortness of breath: Secondary | ICD-10-CM | POA: Diagnosis not present

## 2018-03-07 DIAGNOSIS — R0602 Shortness of breath: Secondary | ICD-10-CM | POA: Diagnosis not present

## 2018-03-15 DIAGNOSIS — E1165 Type 2 diabetes mellitus with hyperglycemia: Secondary | ICD-10-CM | POA: Diagnosis not present

## 2018-03-20 DIAGNOSIS — M5417 Radiculopathy, lumbosacral region: Secondary | ICD-10-CM | POA: Diagnosis not present

## 2018-03-20 DIAGNOSIS — R482 Apraxia: Secondary | ICD-10-CM | POA: Diagnosis not present

## 2018-03-20 DIAGNOSIS — G603 Idiopathic progressive neuropathy: Secondary | ICD-10-CM | POA: Diagnosis not present

## 2018-03-22 DIAGNOSIS — J320 Chronic maxillary sinusitis: Secondary | ICD-10-CM | POA: Diagnosis not present

## 2018-03-22 DIAGNOSIS — Z8673 Personal history of transient ischemic attack (TIA), and cerebral infarction without residual deficits: Secondary | ICD-10-CM | POA: Diagnosis not present

## 2018-03-22 DIAGNOSIS — R296 Repeated falls: Secondary | ICD-10-CM | POA: Diagnosis not present

## 2018-03-22 DIAGNOSIS — R4781 Slurred speech: Secondary | ICD-10-CM | POA: Diagnosis not present

## 2018-03-27 DIAGNOSIS — M75122 Complete rotator cuff tear or rupture of left shoulder, not specified as traumatic: Secondary | ICD-10-CM | POA: Diagnosis not present

## 2018-04-01 DIAGNOSIS — E119 Type 2 diabetes mellitus without complications: Secondary | ICD-10-CM | POA: Diagnosis not present

## 2018-04-01 DIAGNOSIS — Z01810 Encounter for preprocedural cardiovascular examination: Secondary | ICD-10-CM | POA: Diagnosis not present

## 2018-04-01 DIAGNOSIS — J410 Simple chronic bronchitis: Secondary | ICD-10-CM | POA: Diagnosis not present

## 2018-04-01 DIAGNOSIS — E782 Mixed hyperlipidemia: Secondary | ICD-10-CM | POA: Diagnosis not present

## 2018-04-03 DIAGNOSIS — I739 Peripheral vascular disease, unspecified: Secondary | ICD-10-CM | POA: Diagnosis not present

## 2018-04-03 DIAGNOSIS — J441 Chronic obstructive pulmonary disease with (acute) exacerbation: Secondary | ICD-10-CM | POA: Insufficient documentation

## 2018-04-03 DIAGNOSIS — Z01818 Encounter for other preprocedural examination: Secondary | ICD-10-CM | POA: Insufficient documentation

## 2018-04-03 DIAGNOSIS — I25119 Atherosclerotic heart disease of native coronary artery with unspecified angina pectoris: Secondary | ICD-10-CM | POA: Diagnosis not present

## 2018-04-03 DIAGNOSIS — E785 Hyperlipidemia, unspecified: Secondary | ICD-10-CM | POA: Diagnosis not present

## 2018-04-03 DIAGNOSIS — J449 Chronic obstructive pulmonary disease, unspecified: Secondary | ICD-10-CM | POA: Insufficient documentation

## 2018-04-03 DIAGNOSIS — I519 Heart disease, unspecified: Secondary | ICD-10-CM | POA: Diagnosis not present

## 2018-04-03 HISTORY — DX: Encounter for other preprocedural examination: Z01.818

## 2018-04-05 ENCOUNTER — Encounter: Payer: Self-pay | Admitting: Internal Medicine

## 2018-04-05 ENCOUNTER — Ambulatory Visit: Payer: Medicare Other | Admitting: Internal Medicine

## 2018-04-05 VITALS — BP 140/60 | HR 74 | Ht 68.0 in | Wt 214.0 lb

## 2018-04-05 DIAGNOSIS — Z01811 Encounter for preprocedural respiratory examination: Secondary | ICD-10-CM | POA: Diagnosis not present

## 2018-04-05 DIAGNOSIS — R5381 Other malaise: Secondary | ICD-10-CM | POA: Diagnosis not present

## 2018-04-05 DIAGNOSIS — J449 Chronic obstructive pulmonary disease, unspecified: Secondary | ICD-10-CM | POA: Diagnosis not present

## 2018-04-05 DIAGNOSIS — R918 Other nonspecific abnormal finding of lung field: Secondary | ICD-10-CM

## 2018-04-05 MED ORDER — BREO ELLIPTA 100-25 MCG/INH IN AEPB: 1.0000 | INHALATION_SPRAY | Freq: Every day | RESPIRATORY_TRACT | 0 refills | Status: DC

## 2018-04-05 MED ORDER — BREO ELLIPTA 100-25 MCG/INH IN AEPB: 1.0000 | INHALATION_SPRAY | Freq: Every day | RESPIRATORY_TRACT | 5 refills | Status: DC

## 2018-04-05 NOTE — Patient Instructions (Addendum)
ICD-10-CM   1. Chronic obstructive pulmonary disease, unspecified COPD type ([place]) J44.9   2. Preoperative respiratory examination Z01.811   3. Multiple lung nodules on CT R91.8   4. Physical deconditioning R53.81    Chronic obstructive pulmonary disease, unspecified COPD type ([place])  - copd is stable at this point - continue o2 at night - continue spiriva daily - I recommend adding breo to the regimen scheduled daily - recommend using duoneb as needed  - recommend holding off pulmicort - recommend you talk to cadiologist and change lisinopril to another class; and see if cough improved - later at followu[p can discuss role of roflumilast in preventing flareups - ideally you also need pulmonary rehab but right now wobbly legs need to be addressed first  Preoperative respiratory examination - Moderate risk for any pulmonary complication such as pneumonia following surgery - Low moderate risk for prolonged ventilator dependence  - At time of surgery ideal is that he is flare up free for a month and has quit smoking for a month - this helps reduce risk further  Physical deconditioning  - this is a major issue more than copd causing you to be fatigued and winded - only sorting out the back can improve this  Multiple lung nodules on CT - stable April 2017 - April 2019 - followup per throat cancer doctor I suppose   Followup  - 6-8 weeks to see response

## 2018-04-05 NOTE — Addendum Note (Signed)
Addended by: Lorretta Harp on: 04/05/2018 09:47 AM   Modules accepted: Orders

## 2018-04-05 NOTE — Progress Notes (Signed)
Subjective:    Patient ID: CHEO SELVEY, male    DOB: June 23, 1951, 67 y.o.   MRN: [id number]  PCP [name], MD   HPI  IOV 04/05/2018  Chief Complaint  Patient presents with  . Consult    Referred by Dr. [name] due to COPD. Pt was diagnosed in 2017 and states symptoms have become worse. Pt does have c/o SOB with exertion and also cough first thing in morning.    Harish R Rosengren 67 y.o. male from [place] [id number] -presents for new evaluation.  His wife and that is a good historian and gives most of the history.  History is gained from him and review of the old chart.  I also visualized the CT scan images from April 2019 and all the way back through April 2017 personally and agree with the final report  He is an ongoing smoker with a previous history of throat cancer and also chronic systolic heart failure status post cardiac stent for many many years with a stable ejection fraction of 35% being followed at [facility].  For the last 3 years he has had worsening shortness of breath.  Initially given a diagnosis of COPD not otherwise specified.  Wife says that he has had progressive shortness of breath since then.  His maintenance inhaler the Spiriva but he is also on Pulmicort nebulizers twice daily which is not mentioned in his med list.  For the last 3 years except for the year 2018 he has had admissions once a year for COPD exacerbation.  He also goes multiple rounds of prednisone and antibiotics for outpatient COPD exacerbation.  Most recent prednisone antibiotic was in December 2018.  They are frustrated with the repeated flareups.  Shortness of breath is associate with cough and wheezing.  This class III in exertion severity.  Walking from the bedroom to the porch makes him short of breath and relieved by rest there is no associated chest pain.  Also this past year he has had increasing unsteadiness of his lower extremities which is attributed to back issues and  apparently he has been to neurology and spine surgery and a spine surgeon is having a plan for undergoing to our spinal surgery and  [facility].  They are asking about preoperative respiratory assessment as well.    CAT COPD Symptom & Quality of Life Score (GSK trademark) 0 is no burden. 5 is highest burden 04/05/2018   Never Cough -> Cough all the time 3  No phlegm in chest -> Chest is full of phlegm 3  No chest tightness -> Chest feels very tight 0  No dyspnea for 1 flight stairs/hill -> Very dyspneic for 1 flight of stairs 5  No limitations for ADL at home -> Very limited with ADL at home 5  Confident leaving home -> Not at all confident leaving home 0  Sleep soundly -> Do not sleep soundly because of lung condition 0  Lots of Energy -> No energy at all 3  TOTAL Score (max 40)  19      Results for BARAKA, KLATT (MRN [id number]) as of 04/05/2018 09:09  Ref. Range 01/10/2017 08:41  Creatinine Latest Ref Range: 0.61 - 1.24 mg/dL 0.98   Results for ANDY, ALLENDE (MRN [id number]) as of 04/05/2018 09:09  Ref. Range 01/10/2017 08:41  Hemoglobin Latest Ref Range: 13.0 - 17.0 g/dL 15.0    Walking desaturation test today.  We normally 285 feet x  3 laps on room air: He only did one lap.  He needed 2 people to walk with him.  He was extremely unsteady on his lower extremities.  He did not desaturate he just became tachycardic.  Pulse ox was held up in the high 90s   has a past medical history of Arthritis, Basal cell carcinoma, Cancer (HCC), COPD (chronic obstructive pulmonary disease) ([place]), Coronary artery disease, GERD (gastroesophageal reflux disease), Hyperlipidemia, Hypertension, Lesion of vocal cord, Melanoma of back ([place]), Myocardial infarction ([place]) (2000), Peripheral vascular disease ([place]), Pneumonia (02/2015), and Wears glasses.   reports that he has been smoking cigarettes.  He has a 25.00 pack-year smoking history. He has never used smokeless tobacco.  Past  Surgical History:  Procedure Laterality Date  . ABDOMINAL ANGIOGRAM N/A 01/13/2015   Procedure: ABDOMINAL ANGIOGRAM;  Surgeon: Rosetta Posner, MD;  Location[facility] CATH LAB;  Service: Cardiovascular;  Laterality: N/A;  . APPENDECTOMY    . BACK SURGERY    . CARDIAC CATHETERIZATION  2000/2012   with stents in 2000  . CHOLECYSTECTOMY    . COLONOSCOPY    . ELBOW SURGERY     bilateral  . ESOPHAGOGASTRODUODENOSCOPY    . KNEE SURGERY Left   . MICROLARYNGOSCOPY WITH CO2 LASER AND EXCISION OF VOCAL CORD LESION N/A 11/23/2016   Procedure: MICROLARYNGOSCOPY  AND EXCISION OF VOCAL CORD LESION;  Surgeon: Melissa Montane, MD;  Location: [facility];  Service: ENT;  Laterality: N/A;  . MICROLARYNGOSCOPY WITH CO2 LASER AND EXCISION OF VOCAL CORD LESION N/A 01/11/2017   Procedure: MICROLARYNGOSCOPY WITH CO2 LASER AND EXCISION OF VOCAL CORD LESION;  Surgeon: Melissa Montane, MD;  Location: [facility];  Service: ENT;  Laterality: N/A;  . MICROLARYNGOSCOPY WITH LASER N/A 03/16/2015   Procedure: MICROLARYNGOSCOPY ;  Surgeon: Melissa Montane, MD;  Location: [facility];  Service: ENT;  Laterality: N/A;  . peyronie's surgery    . SHOULDER SURGERY Right    rotator cuff  . THROAT SURGERY  1997   cancer removed    Allergies  Allergen Reactions  . Prednisone     Depressed mood - can tolerate it, but it makes him very irritable  . Contrast Media [Iodinated Diagnostic Agents] Rash    Also developed blisters  . Doxycycline Rash  . Penicillins Rash    Has patient had a PCN reaction causing immediate rash, facial/tongue/throat swelling, SOB or lightheadedness with hypotension:unsure Has patient had a PCN reaction causing severe rash involving mucus membranes or skin necrosis:unsure Has patient had a PCN reaction that required hospitalization:No Has patient had a PCN reaction occurring within the last 10 years:No If all of the above answers are "NO", then may proceed with Cephalosporin use.     Immunization History  Administered Date(s)  Administered  . Influenza, High Dose Seasonal PF 10/13/2017    Family History  Problem Relation Age of Onset  . Cancer Mother        bone  . Stroke Father      Current Outpatient Medications:  .  ALPRAZolam (XANAX) 0.25 MG tablet, Take 0.25 mg by mouth 2 (two) times daily as needed for anxiety. , Disp: , Rfl:  .  aspirin 81 MG tablet, Take 162 mg by mouth daily. , Disp: , Rfl:  .  clopidogrel (PLAVIX) 75 MG tablet, TAKE 1 TABLET BY MOUTH ONCE DAILY, Disp: 30 tablet, Rfl: 10 .  cyclobenzaprine (FLEXERIL) 10 MG tablet, Take 10 mg by mouth 2 (two) times daily as needed for muscle spasms. ,  Disp: , Rfl:  .  DULoxetine (CYMBALTA) 60 MG capsule, Take 60 mg by mouth 2 (two) times daily. , Disp: , Rfl:  .  gabapentin (NEURONTIN) 300 MG capsule, , Disp: , Rfl: 2 .  hydrochlorothiazide (HYDRODIURIL) 25 MG tablet, , Disp: , Rfl:  .  ipratropium-albuterol (DUONEB) 0.5-2.5 (3) MG/3ML SOLN, Take 3 mLs by nebulization every 6 (six) hours as needed (for shortness of breath/wheezing. (with bronchitis))., Disp: , Rfl:  .  lisinopril (PRINIVIL,ZESTRIL) 10 MG tablet, , Disp: , Rfl: 1 .  metFORMIN (GLUCOPHAGE) 500 MG tablet, , Disp: , Rfl: 0 .  metoprolol (LOPRESSOR) 50 MG tablet, Take 50 mg by mouth 2 (two) times daily. , Disp: , Rfl:  .  Multiple Vitamins-Minerals (PRESERVISION AREDS 2 PO), Take 2 tablets by mouth daily., Disp: , Rfl:  .  omeprazole (PRILOSEC) 20 MG capsule, Take 20 mg by mouth daily., Disp: , Rfl:  .  rosuvastatin (CRESTOR) 20 MG tablet, , Disp: , Rfl: 0 .  SPIRIVA RESPIMAT 2.5 MCG/ACT AERS, , Disp: , Rfl: 0 .  TOUJEO SOLOSTAR 300 UNIT/ML SOPN, , Disp: , Rfl: 0 .  traZODone (DESYREL) 150 MG tablet, Take 150 mg by mouth at bedtime., Disp: , Rfl:  .  VENTOLIN HFA 108 (90 Base) MCG/ACT inhaler, , Disp: , Rfl: 0   Review of Systems  Constitutional: Negative for fever and unexpected weight change.  HENT: Positive for sinus pressure and sneezing. Negative for congestion, dental problem,  ear pain, nosebleeds, postnasal drip, rhinorrhea, sore throat and trouble swallowing.   Eyes: Positive for itching. Negative for redness.  Respiratory: Positive for cough, shortness of breath and wheezing. Negative for chest tightness.   Cardiovascular: Negative for palpitations and leg swelling.  Gastrointestinal: Negative for nausea and vomiting.  Genitourinary: Negative for dysuria.  Musculoskeletal: Negative for joint swelling.  Skin: Negative for rash.  Allergic/Immunologic: Negative.  Negative for environmental allergies, food allergies and immunocompromised state.  Neurological: Negative for headaches.  Hematological: Bruises/bleeds easily.  Psychiatric/Behavioral: Negative for dysphoric mood. The patient is not nervous/anxious.        Objective:   Physical Exam Vitals:   04/05/18 0906  BP: 140/60  Pulse: 74  SpO2: 95%  Weight: 214 lb (97.1 kg)  Height: 5\' 8"  (1.727 m)    Estimated body mass index is 32.54 kg/m as calculated from the following:   Height as of this encounter: 5\' 8"  (1.727 m).   Weight as of this encounter: 214 lb (97.1 kg).   General Appearance:    Frail male, looks deconditioning  Head:    Normocephalic, without obvious abnormality, atraumatic  Eyes:    PERRL - yes, conjunctiva/corneas - clear      Ears:    Normal external ear canals, both ears  Nose:   NG tube - no  Throat:  ETT TUBE - no , OG tube - no  Neck:   Supple,  No enlargement/tenderness/nodules     Lungs:     Clear to auscultation bilaterally,  Chest wall:    No deformity  Heart:    S1 and S2 normal, no murmur, CVP - no.  Pressors - no  Abdomen:     Soft, no masses, no organomegaly  Genitalia:    Not done  Rectal:   not done  Extremities:   Extremities- intact but very wobbly standing and walking .      Skin:   Intact in exposed areas .      Neurologic:  Sedation - none -> RASS - na . Moves all 4s - yes3. CAM-ICU - neg . Orientation - x 3+. Poor historian             Assessment & Plan:     ICD-10-CM   1. Chronic obstructive pulmonary disease, unspecified COPD type ([place]) J44.9   2. Preoperative respiratory examination Z01.811      [place] Postperative Pulmonary Risk Score - prolonged vent dependence Comment Score  Type of surgery - abd ao aneurysm (27), thoracic (21), neurosurgery / upper abdominal / vascular (21), neck (11) Back surgery 0  Emergency Surgery - (11) El;ective 0  ALbumin < 3 or poor nutritional state - (9) persumed normal 0  BUN > 30 -  (8) Normal in feb 2018 0  Partial or completely dependent functional status - (7) Partial ADL 7  COPD -  (6) yes 6  Age - 55 to 21 (4), > 70  (6) Age 50 4  TOTAL  18  Risk Stratifcation scores  - < 10, 11-19, 20-27, 28-40, >40  Low mdoerate     [place]ET Postperative Pulmonary Risk Score - any complication Comment Score  Age - <50 (0), 50-80 (3), >80 (16) Age 15 3  Preoperative pulse ox - >96 (0), 91-95 (8), <90 (24) 95% 8  Respiratory infection in last month - Yes (17) no 0  Preoperative anemia - < 10gm% - Yes (11) hgb 15 in 2018 0  Surgical incision - Upper abdominal (15), Thoracic (24) backl 0  Duration of surgery - <2h (0), 2-3h (16), >3h (23) 2h per wife 16  Emergency Surgery - Yes (8) elective 0  TOTAL  27  Risk Stratification - Low (<26), Intermediate (26-44), High (>45)  intermediate   Preoperative respiratory examination - based on above - Moderate risk for any pulmonary complication such as pneumonia following surgery - Low moderate risk for prolonged ventilator dependence  - At time of surgery ideal is that he is flare up free for a month and has quit smoking for a month - this helps reduce risk further  Chronic obstructive pulmonary disease, unspecified COPD type (HCC)  - copd is stable at this point - continue o2 at night - continue spiriva daily - I rcommend adding breo to the regimen scheduled daily - recommend using duoneb as needed  - recommend holding off pulmicort -  recommend you talk to cadiologist and change lisinopril to another class; and see if cough improved - later at followu[p can discuss role of roflumilast in preventing flareups - ideally you also need pulmonary rehab but right now wobbly legs need to be addressed first    Physical deconditioning  - this is a major issue more than copd causing you to be fatigued and winded - only sorting out the back can improve this  Multiple lung nodules on CT - stable April 2017 - April 2019 - followup per throat cancer doctor I suppose   Followup  - 6-8 weeks to see response    Dr. Brand Males, M.D., [facility].C.P Pulmonary and Critical Care Medicine Staff Physician, [place] Director - Interstitial Lung Disease  Program  Pulmonary [place] at [place], [place], [address]  Pager: [phone], If no answer or between  15:00h - 7:00h: call [phone] Telephone: [phone]

## 2018-04-11 DIAGNOSIS — Z01810 Encounter for preprocedural cardiovascular examination: Secondary | ICD-10-CM | POA: Diagnosis not present

## 2018-04-17 ENCOUNTER — Telehealth: Payer: Self-pay | Admitting: Internal Medicine

## 2018-04-17 DIAGNOSIS — E1165 Type 2 diabetes mellitus with hyperglycemia: Secondary | ICD-10-CM | POA: Diagnosis not present

## 2018-04-17 NOTE — Telephone Encounter (Signed)
OV notes have been faxed. Nothing further was needed. 

## 2018-04-18 ENCOUNTER — Encounter: Payer: Self-pay | Admitting: Family Medicine

## 2018-04-22 DIAGNOSIS — K219 Gastro-esophageal reflux disease without esophagitis: Secondary | ICD-10-CM | POA: Diagnosis not present

## 2018-04-22 DIAGNOSIS — I252 Old myocardial infarction: Secondary | ICD-10-CM | POA: Diagnosis not present

## 2018-04-22 DIAGNOSIS — M48062 Spinal stenosis, lumbar region with neurogenic claudication: Secondary | ICD-10-CM | POA: Diagnosis not present

## 2018-04-22 DIAGNOSIS — Z7902 Long term (current) use of antithrombotics/antiplatelets: Secondary | ICD-10-CM | POA: Diagnosis not present

## 2018-04-22 DIAGNOSIS — R269 Unspecified abnormalities of gait and mobility: Secondary | ICD-10-CM | POA: Diagnosis not present

## 2018-04-22 DIAGNOSIS — E119 Type 2 diabetes mellitus without complications: Secondary | ICD-10-CM | POA: Diagnosis not present

## 2018-04-22 DIAGNOSIS — Z7982 Long term (current) use of aspirin: Secondary | ICD-10-CM | POA: Diagnosis not present

## 2018-04-22 DIAGNOSIS — E782 Mixed hyperlipidemia: Secondary | ICD-10-CM | POA: Diagnosis not present

## 2018-04-22 DIAGNOSIS — M48061 Spinal stenosis, lumbar region without neurogenic claudication: Secondary | ICD-10-CM | POA: Diagnosis not present

## 2018-04-22 DIAGNOSIS — M5415 Radiculopathy, thoracolumbar region: Secondary | ICD-10-CM | POA: Diagnosis not present

## 2018-04-22 DIAGNOSIS — Z79899 Other long term (current) drug therapy: Secondary | ICD-10-CM | POA: Diagnosis not present

## 2018-04-22 DIAGNOSIS — I10 Essential (primary) hypertension: Secondary | ICD-10-CM | POA: Diagnosis not present

## 2018-04-22 DIAGNOSIS — M545 Low back pain: Secondary | ICD-10-CM | POA: Diagnosis not present

## 2018-04-22 DIAGNOSIS — J439 Emphysema, unspecified: Secondary | ICD-10-CM | POA: Diagnosis not present

## 2018-04-22 DIAGNOSIS — M5416 Radiculopathy, lumbar region: Secondary | ICD-10-CM | POA: Diagnosis not present

## 2018-04-22 DIAGNOSIS — Z794 Long term (current) use of insulin: Secondary | ICD-10-CM | POA: Diagnosis not present

## 2018-04-22 DIAGNOSIS — F17210 Nicotine dependence, cigarettes, uncomplicated: Secondary | ICD-10-CM | POA: Diagnosis not present

## 2018-04-22 DIAGNOSIS — D72829 Elevated white blood cell count, unspecified: Secondary | ICD-10-CM | POA: Diagnosis not present

## 2018-04-23 DIAGNOSIS — E119 Type 2 diabetes mellitus without complications: Secondary | ICD-10-CM | POA: Diagnosis not present

## 2018-04-23 DIAGNOSIS — M5416 Radiculopathy, lumbar region: Secondary | ICD-10-CM | POA: Diagnosis not present

## 2018-04-23 DIAGNOSIS — Z794 Long term (current) use of insulin: Secondary | ICD-10-CM | POA: Diagnosis not present

## 2018-04-23 DIAGNOSIS — Z7902 Long term (current) use of antithrombotics/antiplatelets: Secondary | ICD-10-CM | POA: Diagnosis not present

## 2018-04-23 DIAGNOSIS — K219 Gastro-esophageal reflux disease without esophagitis: Secondary | ICD-10-CM | POA: Diagnosis not present

## 2018-04-23 DIAGNOSIS — J439 Emphysema, unspecified: Secondary | ICD-10-CM | POA: Diagnosis not present

## 2018-04-23 DIAGNOSIS — Z79899 Other long term (current) drug therapy: Secondary | ICD-10-CM | POA: Diagnosis not present

## 2018-04-23 DIAGNOSIS — E782 Mixed hyperlipidemia: Secondary | ICD-10-CM | POA: Diagnosis not present

## 2018-04-23 DIAGNOSIS — F17210 Nicotine dependence, cigarettes, uncomplicated: Secondary | ICD-10-CM | POA: Diagnosis not present

## 2018-04-23 DIAGNOSIS — Z7982 Long term (current) use of aspirin: Secondary | ICD-10-CM | POA: Diagnosis not present

## 2018-04-23 DIAGNOSIS — I10 Essential (primary) hypertension: Secondary | ICD-10-CM | POA: Diagnosis not present

## 2018-04-23 DIAGNOSIS — M48062 Spinal stenosis, lumbar region with neurogenic claudication: Secondary | ICD-10-CM | POA: Diagnosis not present

## 2018-04-23 DIAGNOSIS — I252 Old myocardial infarction: Secondary | ICD-10-CM | POA: Diagnosis not present

## 2018-04-23 DIAGNOSIS — D72829 Elevated white blood cell count, unspecified: Secondary | ICD-10-CM | POA: Diagnosis not present

## 2018-05-14 DIAGNOSIS — R05 Cough: Secondary | ICD-10-CM | POA: Diagnosis not present

## 2018-05-22 DIAGNOSIS — M25512 Pain in left shoulder: Secondary | ICD-10-CM | POA: Diagnosis not present

## 2018-05-24 ENCOUNTER — Other Ambulatory Visit: Payer: Self-pay

## 2018-05-24 ENCOUNTER — Ambulatory Visit
Admission: RE | Admit: 2018-05-24 | Discharge: 2018-05-24 | Disposition: A | Discharge status: Home / Self Care | Payer: Self-pay | Source: Ambulatory Visit | Attending: Internal Medicine | Admitting: Internal Medicine

## 2018-05-24 DIAGNOSIS — R918 Other nonspecific abnormal finding of lung field: Secondary | ICD-10-CM

## 2018-05-25 DIAGNOSIS — M19012 Primary osteoarthritis, left shoulder: Secondary | ICD-10-CM | POA: Diagnosis not present

## 2018-05-25 DIAGNOSIS — M25512 Pain in left shoulder: Secondary | ICD-10-CM | POA: Diagnosis not present

## 2018-05-28 DIAGNOSIS — M19012 Primary osteoarthritis, left shoulder: Secondary | ICD-10-CM | POA: Diagnosis not present

## 2018-06-03 DIAGNOSIS — J441 Chronic obstructive pulmonary disease with (acute) exacerbation: Secondary | ICD-10-CM | POA: Diagnosis not present

## 2018-06-12 DIAGNOSIS — M19012 Primary osteoarthritis, left shoulder: Secondary | ICD-10-CM | POA: Diagnosis not present

## 2018-06-12 DIAGNOSIS — M7542 Impingement syndrome of left shoulder: Secondary | ICD-10-CM | POA: Diagnosis not present

## 2018-06-18 DIAGNOSIS — E119 Type 2 diabetes mellitus without complications: Secondary | ICD-10-CM | POA: Diagnosis not present

## 2018-06-18 DIAGNOSIS — J410 Simple chronic bronchitis: Secondary | ICD-10-CM | POA: Diagnosis not present

## 2018-06-18 DIAGNOSIS — I10 Essential (primary) hypertension: Secondary | ICD-10-CM | POA: Diagnosis not present

## 2018-06-18 DIAGNOSIS — Z01810 Encounter for preprocedural cardiovascular examination: Secondary | ICD-10-CM | POA: Diagnosis not present

## 2018-06-25 DIAGNOSIS — E1142 Type 2 diabetes mellitus with diabetic polyneuropathy: Secondary | ICD-10-CM | POA: Diagnosis not present

## 2018-06-25 DIAGNOSIS — D72829 Elevated white blood cell count, unspecified: Secondary | ICD-10-CM | POA: Diagnosis not present

## 2018-06-25 DIAGNOSIS — E782 Mixed hyperlipidemia: Secondary | ICD-10-CM | POA: Diagnosis not present

## 2018-07-08 DIAGNOSIS — M7542 Impingement syndrome of left shoulder: Secondary | ICD-10-CM | POA: Diagnosis not present

## 2018-07-08 DIAGNOSIS — M19012 Primary osteoarthritis, left shoulder: Secondary | ICD-10-CM | POA: Diagnosis not present

## 2018-07-11 DIAGNOSIS — E7800 Pure hypercholesterolemia, unspecified: Secondary | ICD-10-CM | POA: Diagnosis not present

## 2018-07-11 DIAGNOSIS — I2510 Atherosclerotic heart disease of native coronary artery without angina pectoris: Secondary | ICD-10-CM | POA: Diagnosis not present

## 2018-07-11 DIAGNOSIS — Z7902 Long term (current) use of antithrombotics/antiplatelets: Secondary | ICD-10-CM | POA: Diagnosis not present

## 2018-07-11 DIAGNOSIS — M24112 Other articular cartilage disorders, left shoulder: Secondary | ICD-10-CM | POA: Diagnosis not present

## 2018-07-11 DIAGNOSIS — S43492A Other sprain of left shoulder joint, initial encounter: Secondary | ICD-10-CM | POA: Diagnosis not present

## 2018-07-11 DIAGNOSIS — Z7982 Long term (current) use of aspirin: Secondary | ICD-10-CM | POA: Diagnosis not present

## 2018-07-11 DIAGNOSIS — E119 Type 2 diabetes mellitus without complications: Secondary | ICD-10-CM | POA: Diagnosis not present

## 2018-07-11 DIAGNOSIS — J449 Chronic obstructive pulmonary disease, unspecified: Secondary | ICD-10-CM | POA: Diagnosis not present

## 2018-07-11 DIAGNOSIS — K219 Gastro-esophageal reflux disease without esophagitis: Secondary | ICD-10-CM | POA: Diagnosis not present

## 2018-07-11 DIAGNOSIS — M7552 Bursitis of left shoulder: Secondary | ICD-10-CM | POA: Diagnosis not present

## 2018-07-11 DIAGNOSIS — M7542 Impingement syndrome of left shoulder: Secondary | ICD-10-CM | POA: Diagnosis not present

## 2018-07-11 DIAGNOSIS — I252 Old myocardial infarction: Secondary | ICD-10-CM | POA: Diagnosis not present

## 2018-07-11 DIAGNOSIS — M65812 Other synovitis and tenosynovitis, left shoulder: Secondary | ICD-10-CM | POA: Diagnosis not present

## 2018-07-11 DIAGNOSIS — F17210 Nicotine dependence, cigarettes, uncomplicated: Secondary | ICD-10-CM | POA: Diagnosis not present

## 2018-07-11 DIAGNOSIS — Z794 Long term (current) use of insulin: Secondary | ICD-10-CM | POA: Diagnosis not present

## 2018-07-11 DIAGNOSIS — M25712 Osteophyte, left shoulder: Secondary | ICD-10-CM | POA: Diagnosis not present

## 2018-07-11 DIAGNOSIS — I10 Essential (primary) hypertension: Secondary | ICD-10-CM | POA: Diagnosis not present

## 2018-07-11 DIAGNOSIS — M19012 Primary osteoarthritis, left shoulder: Secondary | ICD-10-CM | POA: Diagnosis not present

## 2018-07-22 DIAGNOSIS — M25512 Pain in left shoulder: Secondary | ICD-10-CM | POA: Diagnosis not present

## 2018-07-22 DIAGNOSIS — M6281 Muscle weakness (generalized): Secondary | ICD-10-CM | POA: Diagnosis not present

## 2018-08-12 ENCOUNTER — Ambulatory Visit (INDEPENDENT_AMBULATORY_CARE_PROVIDER_SITE_OTHER): Payer: Medicare Other | Admitting: Internal Medicine

## 2018-08-12 ENCOUNTER — Encounter: Payer: Self-pay | Admitting: Internal Medicine

## 2018-08-12 VITALS — BP 110/64 | HR 71 | Ht 68.0 in | Wt 215.0 lb

## 2018-08-12 DIAGNOSIS — R1312 Dysphagia, oropharyngeal phase: Secondary | ICD-10-CM

## 2018-08-12 DIAGNOSIS — J441 Chronic obstructive pulmonary disease with (acute) exacerbation: Secondary | ICD-10-CM | POA: Diagnosis not present

## 2018-08-12 DIAGNOSIS — F17210 Nicotine dependence, cigarettes, uncomplicated: Secondary | ICD-10-CM

## 2018-08-12 DIAGNOSIS — J449 Chronic obstructive pulmonary disease, unspecified: Secondary | ICD-10-CM | POA: Diagnosis not present

## 2018-08-12 DIAGNOSIS — R1310 Dysphagia, unspecified: Secondary | ICD-10-CM

## 2018-08-12 DIAGNOSIS — K219 Gastro-esophageal reflux disease without esophagitis: Secondary | ICD-10-CM | POA: Diagnosis not present

## 2018-08-12 DIAGNOSIS — R1314 Dysphagia, pharyngoesophageal phase: Secondary | ICD-10-CM | POA: Diagnosis not present

## 2018-08-12 HISTORY — DX: Dysphagia, pharyngoesophageal phase: R13.14

## 2018-08-12 HISTORY — DX: Dysphagia, oropharyngeal phase: R13.12

## 2018-08-12 MED ORDER — FLUTICASONE-UMECLIDIN-VILANT 100-62.5-25 MCG/INH IN AEPB: 1.0000 | INHALATION_SPRAY | Freq: Every day | RESPIRATORY_TRACT | 0 refills | Status: DC

## 2018-08-12 MED ORDER — FLUTICASONE-UMECLIDIN-VILANT 100-62.5-25 MCG/INH IN AEPB: 1.0000 | INHALATION_SPRAY | Freq: Every day | RESPIRATORY_TRACT | 11 refills | Status: DC

## 2018-08-12 MED ORDER — AMLODIPINE BESYLATE 5 MG PO TABS: 5.0000 mg | ORAL_TABLET | Freq: Every day | ORAL | 11 refills | Status: DC

## 2018-08-12 NOTE — Patient Instructions (Addendum)
ICD-10-CM   1. COPD with acute exacerbation ([place]) J44.1   2. Chronic obstructive pulmonary disease, unspecified COPD type ([place]) J44.9   3. COPD, frequent exacerbations ([place]) J44.1   4. Smoking greater than 25 pack years F17.210   5. Dysphagia, unspecified type R13.10    You are in another exacerbation  Agree we need to get to the bottom of this but it appears that combination of COPD, smoking, lisinopril are all playing a role infrequent COPD flareups  PLAN -focusing on reducing recurrent COPD exacerbations -Stop lisinopril; NSAIDs start amlodipine 5 mg/day -Change Spiriva and Brio [stopped taking both] to Trelegy inhaler once daily -ENT advised for swallowing difficulties -Try to quit smoking -No flu shot today given wheezing -Albuterol as needed  Follow-up - 6 weeks do PFT testing -6 weeks with Dr. Chase Caller in pulmonary clinic  -At follow-up if things are not better we could consider a role for Roflumilast and checking alpha 1 if not checked already

## 2018-08-12 NOTE — Progress Notes (Signed)
HPI  IOV 04/05/2018  Chief Complaint  Patient presents with  . Consult    Referred by Dr. [name] due to COPD. Pt was diagnosed in 2017 and states symptoms have become worse. Pt does have c/o SOB with exertion and also cough first thing in morning.    Don [name] 67 y.o. male from [place] [address] -presents for new evaluation.  His wife and that is a good historian and gives most of the history.  History is gained from him and review of the old chart.  I also visualized the CT scan images from April 2019 and all the way back through April 2017 personally and agree with the final report  He is an ongoing smoker with a previous history of throat cancer and also chronic systolic heart failure status post cardiac stent for many many years with a stable ejection fraction of 35% being followed at [facility].  For the last 3 years he has had worsening shortness of breath.  Initially given a diagnosis of COPD not otherwise specified.  Wife says that he has had progressive shortness of breath since then.  His maintenance inhaler the Spiriva but he is also on Pulmicort nebulizers twice daily which is not mentioned in his med list.  For the last 3 years except for the year 2018 he has had admissions once a year for COPD exacerbation.  He also goes multiple rounds of prednisone and antibiotics for outpatient COPD exacerbation.  Most recent prednisone antibiotic was in December 2018.  They are frustrated with the repeated flareups.  Shortness of breath is associate with cough and wheezing.  This class III in exertion severity.  Walking from the bedroom to the porch makes him short of breath and relieved by rest there is no associated chest pain.  Also this past year he has had increasing unsteadiness of his lower extremities which is attributed to back issues and apparently he has been to neurology and spine surgery and a spine surgeon is having a plan for undergoing to our spinal  surgery and  [facility].  They are asking about preoperative respiratory assessment as well.         Results for AQIL, GOETTING (MRN [id number]) as of 04/05/2018 09:09  Ref. Range 01/10/2017 08:41  Creatinine Latest Ref Range: 0.61 - 1.24 mg/dL 0.98   Results for JP, EASTHAM (MRN [id number]) as of 04/05/2018 09:09  Ref. Range 01/10/2017 08:41  Hemoglobin Latest Ref Range: 13.0 - 17.0 g/dL 15.0    Walking desaturation test today.  We normally 285 feet x 3 laps on room air: He only did one lap.  He needed 2 people to walk with him.  He was extremely unsteady on his lower extremities.  He did not desaturate he just became tachycardic.  Pulse ox was held up in the high 90s   Chronic obstructive pulmonary disease, unspecified COPD type ([place])  - copd is stable at this point - continue o2 at night - continue spiriva daily - I recommend adding breo to the regimen scheduled daily - recommend using duoneb as needed  - recommend holding off pulmicort - recommend you talk to cadiologist and change lisinopril to another class; and see if cough improved - later at followu[p can discuss role of roflumilast in preventing flareups - ideally you also need pulmonary rehab but right now wobbly legs need to be addressed first  Preoperative respiratory examination - Moderate risk for  any pulmonary complication such as pneumonia following surgery - Low moderate risk for prolonged ventilator dependence  - At time of surgery ideal is that he is flare up free for a month and has quit smoking for a month - this helps reduce risk further  Physical deconditioning  - this is a major issue more than copd causing you to be fatigued and winded - only sorting out the back can improve this  Multiple lung nodules on CT - stable April 2017 - April 2019 - followup per throat cancer doctor I suppose   Followup  - 6-8 weeks to see response   OV 08/12/2018  Subjective:  Patient ID:  Don [name], male , DOB: 17-Jun-1951 , age 67 y.o. , MRN: [id number] , ADDRESS: [place] [id number]   08/12/2018 -   Chief Complaint  Patient presents with  . Follow-up    Pt had back surgery after last visit and has also had shoulder surgery.  Pt has had a lot of problems with coughing with white to occ yellow phlegm that is very thick that has been choking him at times. Pt also has had problems with SOB. Pt has had low O2 sats and does where the O2 at night and during the day as needed.     HPI Don [name] 67 y.o. -presents for COPD follow-up.  Presents with his wife.  Since his last visit he has had shoulder and spine surgery successfully.  He continues to smoke however.  In the last 1 month wife is reporting deterioration in cough wheezing and dyspnea and chest tightness.  Sputum is changed from white in color to yellow-brown.  She is very frustrated by the deterioration.  She agrees that lisinopril can be contributing to cough because she herself suffered from that.  Nevertheless she is frustrated extremely.  She is asking if any medicines can prevent COPD exacerbations.  Review of the chart indicates April 2019 he did have CT scan of the chest which showed chronic stable nodules.  He has throat cancer.  He has some occasional dysphagia.  The wife is wondering if it is because of thick mucus.  Apparently his esophagus has been stretch.  ENT appointment is pending today.  CAT score is worse at 27.  He continues to smoke.    CAT COPD Symptom & Quality of Life Score (GSK trademark) 0 is no burden. 5 is highest burden 04/05/2018  08/12/2018   Never Cough -> Cough all the time 3 3  No phlegm in chest -> Chest is full of phlegm 3 4  No chest tightness -> Chest feels very tight 0 2  No dyspnea for 1 flight stairs/hill -> Very dyspneic for 1 flight of stairs 5 5  No limitations for ADL at home -> Very limited with ADL at home 5 3  Confident leaving home -> Not at all  confident leaving home 0 0  Sleep soundly -> Do not sleep soundly because of lung condition 0 5  Lots of Energy -> No energy at all 3 5  TOTAL Score (max 40)  19 27    ROS - per HPI     has a past medical history of Arthritis, Basal cell carcinoma, Cancer ([place]), COPD (chronic obstructive pulmonary disease) ([place]), Coronary artery disease, GERD (gastroesophageal reflux disease), Hyperlipidemia, Hypertension, Lesion of vocal cord, Melanoma of back ([place]), Myocardial infarction ([place]) (2000), Peripheral vascular disease ([place]), Pneumonia (02/2015), and Wears glasses.   reports that  he has been smoking cigarettes. He has a 25.00 pack-year smoking history. He has never used smokeless tobacco.  Past Surgical History:  Procedure Laterality Date  . ABDOMINAL ANGIOGRAM N/A 01/13/2015   Procedure: ABDOMINAL ANGIOGRAM;  Surgeon: Rosetta Posner, MD;  Location[facility] C[facility] LAB;  Service: Cardiovascular;  Laterality: N/A;  . APPENDECTOMY    . BACK SURGERY    . CARDIAC CATHETERIZATION  2000/2012   with stents in 2000  . CHOLECYSTECTOMY    . COLONOSCOPY    . ELBOW SURGERY     bilateral  . ESOPHAGOGASTRODUODENOSCOPY    . KNEE SURGERY Left   . MICROLARYNGOSCOPY WITH CO2 LASER AND EXCISION OF VOCAL CORD LESION N/A 11/23/2016   Procedure: MICROLARYNGOSCOPY  AND EXCISION OF VOCAL CORD LESION;  Surgeon: Melissa Montane, MD;  Location: [facility];  Service: ENT;  Laterality: N/A;  . MICROLARYNGOSCOPY WITH CO2 LASER AND EXCISION OF VOCAL CORD LESION N/A 01/11/2017   Procedure: MICROLARYNGOSCOPY WITH CO2 LASER AND EXCISION OF VOCAL CORD LESION;  Surgeon: Melissa Montane, MD;  Location: [facility];  Service: ENT;  Laterality: N/A;  . MICROLARYNGOSCOPY WITH LASER N/A 03/16/2015   Procedure: MICROLARYNGOSCOPY ;  Surgeon: Melissa Montane, MD;  Location: [facility];  Service: ENT;  Laterality: N/A;  . peyronie's surgery    . SHOULDER SURGERY Right    rotator cuff  . THROAT SURGERY  1997   cancer removed    Allergies  Allergen Reactions  . Prednisone      Depressed mood - can tolerate it, but it makes him very irritable  . Contrast Media [Iodinated Diagnostic Agents] Rash    Also developed blisters  . Doxycycline Rash  . Penicillins Rash    Has patient had a PCN reaction causing immediate rash, facial/tongue/throat swelling, SOB or lightheadedness with hypotension:unsure Has patient had a PCN reaction causing severe rash involving mucus membranes or skin necrosis:unsure Has patient had a PCN reaction that required hospitalization:No Has patient had a PCN reaction occurring within the last 10 years:No If all of the above answers are "NO", then may proceed with Cephalosporin use.     Immunization History  Administered Date(s) Administered  . Influenza, High Dose Seasonal PF 10/13/2017    Family History  Problem Relation Age of Onset  . Cancer Mother        bone  . Stroke Father      Current Outpatient Medications:  .  ALPRAZolam (XANAX) 0.25 MG tablet, Take 0.25 mg by mouth 2 (two) times daily as needed for anxiety. , Disp: , Rfl:  .  aspirin 81 MG tablet, Take 162 mg by mouth daily. , Disp: , Rfl:  .  BREO ELLIPTA 100-25 MCG/INH AEPB, Inhale 1 puff into the lungs daily., Disp: 1 each, Rfl: 5 .  clopidogrel (PLAVIX) 75 MG tablet, TAKE 1 TABLET BY MOUTH ONCE DAILY, Disp: 30 tablet, Rfl: 10 .  cyclobenzaprine (FLEXERIL) 10 MG tablet, Take 10 mg by mouth 2 (two) times daily as needed for muscle spasms. , Disp: , Rfl:  .  DULoxetine (CYMBALTA) 60 MG capsule, Take 60 mg by mouth 2 (two) times daily. , Disp: , Rfl:  .  gabapentin (NEURONTIN) 300 MG capsule, , Disp: , Rfl: 2 .  hydrochlorothiazide (HYDRODIURIL) 25 MG tablet, , Disp: , Rfl:  .  ipratropium-albuterol (DUONEB) 0.5-2.5 (3) MG/3ML SOLN, Take 3 mLs by nebulization every 6 (six) hours as needed (for shortness of breath/wheezing. (with bronchitis))., Disp: , Rfl:  .  lisinopril (PRINIVIL,ZESTRIL) 10 MG  tablet, , Disp: , Rfl: 1 .  metFORMIN (GLUCOPHAGE) 500 MG tablet, , Disp:  , Rfl: 0 .  metoprolol (LOPRESSOR) 50 MG tablet, Take 50 mg by mouth 2 (two) times daily. , Disp: , Rfl:  .  Multiple Vitamins-Minerals (PRESERVISION AREDS 2 PO), Take 2 tablets by mouth daily., Disp: , Rfl:  .  omeprazole (PRILOSEC) 20 MG capsule, Take 20 mg by mouth daily., Disp: , Rfl:  .  rosuvastatin (CRESTOR) 20 MG tablet, , Disp: , Rfl: 0 .  SPIRIVA RESPIMAT 2.5 MCG/ACT AERS, , Disp: , Rfl: 0 .  TOUJEO SOLOSTAR 300 UNIT/ML SOPN, , Disp: , Rfl: 0 .  traZODone (DESYREL) 150 MG tablet, Take 150 mg by mouth at bedtime., Disp: , Rfl:  .  VENTOLIN HFA 108 (90 Base) MCG/ACT inhaler, , Disp: , Rfl: 0      Objective:   Vitals:   08/12/18 1057  BP: 110/64  Pulse: 71  SpO2: 95%  Weight: 215 lb (97.5 kg)  Height: 5\' 8"  (1.727 m)    Estimated body mass index is 32.69 kg/m as calculated from the following:   Height as of this encounter: 5\' 8"  (1.727 m).   Weight as of this encounter: 215 lb (97.5 kg).  @WEIGHTCHANGE @  [employer]   08/12/18 1057  Weight: 215 lb (97.5 kg)     Physical Exam  General Appearance:    Alert, cooperative, no distress, appears stated age - oldeer , Deconditioned looking - yes , OBESE  - yes, Sitting on Wheelchair -  no  Head:    Normocephalic, without obvious abnormality, atraumatic  Eyes:    PERRL, conjunctiva/corneas clear,  Ears:    Normal TM's and external ear canals, both ears  Nose:   Nares normal, septum midline, mucosa normal, no drainage    or sinus tenderness. OXYGEN ON  - no . Patient is @ ra   Throat:   Lips, mucosa, and tongue normal; teeth and gums normal. Cyanosis on lips - no  Neck:   Supple, symmetrical, trachea midline, no adenopathy;    thyroid:  no enlargement/tenderness/nodules; no carotid   bruit or JVD  Back:     Symmetric, no curvature, ROM normal, no CVA tenderness  Lungs:     Distress - no , Wheeze yes, Barrell Chest - yes, Purse lip breathing - mild yes, Crackles - no   Chest Wall:    No tenderness or deformity.     Heart:    Regular rate and rhythm, S1 and S2 normal, no rub   or gallop, Murmur - no  Breast Exam:    NOT DONE  Abdomen:     Soft, non-tender, bowel sounds active all four quadrants,    no masses, no organomegaly. Visceral obesity - yes  Genitalia:   NOT DONE  Rectal:   NOT DONE  Extremities:   Extremities - normal, Has Cane - no, Clubbing - no, Edema - no  Pulses:   2+ and symmetric all extremities  Skin:   Stigmata of Connective Tissue Disease - no  Lymph nodes:   Cervical, supraclavicular, and axillary nodes normal  Psychiatric:  Neurologic:   Pleasant - yes, Anxious - no, Flat affect - yes  CAm-ICU - neg, Alert and Oriented x 3 - yes, Moves all 4s - yes, Speech - normal, Cognition - intact           Assessment:       ICD-10-CM   1. COPD with acute exacerbation ([place])  J44.1   2. Chronic obstructive pulmonary disease, unspecified COPD type ([place]) J44.9   3. COPD, frequent exacerbations ([place]) J44.1   4. Smoking greater than 25 pack years F17.210   5. Dysphagia, unspecified type R13.10        Plan:     Patient Instructions     ICD-10-CM   1. COPD with acute exacerbation ([place]) J44.1   2. Chronic obstructive pulmonary disease, unspecified COPD type ([place]) J44.9   3. COPD, frequent exacerbations ([place]) J44.1   4. Smoking greater than 25 pack years F17.210   5. Dysphagia, unspecified type R13.10    You are in another exacerbation  Agree we need to get to the bottom of this but it appears that combination of COPD, smoking, lisinopril are all playing a role infrequent COPD flareups  PLAN -focusing on reducing recurrent COPD exacerbations -Stop lisinopril; NSAIDs start amlodipine 5 mg/day -Change Spiriva and Brio [stopped taking both] to Trelegy inhaler once daily -ENT advised for swallowing difficulties -Try to quit smoking -No flu shot today given wheezing -Albuterol as needed  Follow-up - 6 weeks do PFT testing -6 weeks with Dr. Chase Caller in pulmonary clinic  -At  follow-up if things are not better we could consider a role for Roflumilast and checking alpha 1 if not checked already      SIGNATURE    Dr. Brand Males, M.D., F.C.C.P,  Pulmonary and Critical Care Medicine Staff Physician, [name] Director - Interstitial Lung Disease  Program  Pulmonary [place] at [place], [place], [address]  Pager: [phone], If no answer or between  15:00h - 7:00h: call [phone] Telephone: [phone]  11:21 AM 08/12/2018

## 2018-08-13 ENCOUNTER — Other Ambulatory Visit: Payer: Self-pay | Admitting: Otolaryngology

## 2018-08-13 DIAGNOSIS — R1314 Dysphagia, pharyngoesophageal phase: Secondary | ICD-10-CM

## 2018-08-19 ENCOUNTER — Ambulatory Visit
Admission: RE | Admit: 2018-08-19 | Discharge: 2018-08-19 | Disposition: A | Discharge status: Home / Self Care | Payer: Medicare Other | Source: Ambulatory Visit | Attending: Otolaryngology | Admitting: Otolaryngology

## 2018-08-19 DIAGNOSIS — R1314 Dysphagia, pharyngoesophageal phase: Secondary | ICD-10-CM

## 2018-08-19 DIAGNOSIS — R1310 Dysphagia, unspecified: Secondary | ICD-10-CM | POA: Diagnosis not present

## 2018-08-28 DIAGNOSIS — Z23 Encounter for immunization: Secondary | ICD-10-CM | POA: Diagnosis not present

## 2018-09-23 ENCOUNTER — Ambulatory Visit: Payer: Medicare Other | Admitting: Internal Medicine

## 2018-10-02 DIAGNOSIS — Z7982 Long term (current) use of aspirin: Secondary | ICD-10-CM

## 2018-10-02 HISTORY — DX: Long term (current) use of aspirin: Z79.82

## 2018-10-21 DIAGNOSIS — J441 Chronic obstructive pulmonary disease with (acute) exacerbation: Secondary | ICD-10-CM | POA: Diagnosis not present

## 2018-11-01 DIAGNOSIS — J441 Chronic obstructive pulmonary disease with (acute) exacerbation: Secondary | ICD-10-CM | POA: Diagnosis not present

## 2018-11-12 DIAGNOSIS — R0902 Hypoxemia: Secondary | ICD-10-CM | POA: Diagnosis not present

## 2018-11-12 DIAGNOSIS — J9611 Chronic respiratory failure with hypoxia: Secondary | ICD-10-CM | POA: Diagnosis not present

## 2018-11-12 DIAGNOSIS — J441 Chronic obstructive pulmonary disease with (acute) exacerbation: Secondary | ICD-10-CM | POA: Diagnosis not present

## 2019-01-09 DIAGNOSIS — R0902 Hypoxemia: Secondary | ICD-10-CM | POA: Diagnosis not present

## 2019-01-14 DIAGNOSIS — T83490A Other mechanical complication of penile (implanted) prosthesis, initial encounter: Secondary | ICD-10-CM | POA: Insufficient documentation

## 2019-01-14 DIAGNOSIS — R3914 Feeling of incomplete bladder emptying: Secondary | ICD-10-CM | POA: Diagnosis not present

## 2019-01-14 DIAGNOSIS — N401 Enlarged prostate with lower urinary tract symptoms: Secondary | ICD-10-CM

## 2019-01-14 DIAGNOSIS — N50812 Left testicular pain: Secondary | ICD-10-CM | POA: Diagnosis not present

## 2019-01-14 HISTORY — DX: Benign prostatic hyperplasia with lower urinary tract symptoms: N40.1

## 2019-01-14 HISTORY — DX: Other mechanical complication of implanted penile prosthesis, initial encounter: T83.490A

## 2019-01-14 HISTORY — DX: Left testicular pain: N50.812

## 2019-01-21 DIAGNOSIS — N50812 Left testicular pain: Secondary | ICD-10-CM | POA: Diagnosis not present

## 2019-02-04 DIAGNOSIS — J441 Chronic obstructive pulmonary disease with (acute) exacerbation: Secondary | ICD-10-CM | POA: Diagnosis not present

## 2019-02-07 DIAGNOSIS — R0902 Hypoxemia: Secondary | ICD-10-CM | POA: Diagnosis not present

## 2019-03-10 DIAGNOSIS — R0902 Hypoxemia: Secondary | ICD-10-CM | POA: Diagnosis not present

## 2019-03-13 DIAGNOSIS — Z122 Encounter for screening for malignant neoplasm of respiratory organs: Secondary | ICD-10-CM | POA: Diagnosis not present

## 2019-03-13 DIAGNOSIS — Z1331 Encounter for screening for depression: Secondary | ICD-10-CM | POA: Diagnosis not present

## 2019-03-13 DIAGNOSIS — Z6828 Body mass index (BMI) 28.0-28.9, adult: Secondary | ICD-10-CM | POA: Diagnosis not present

## 2019-03-13 DIAGNOSIS — Z0001 Encounter for general adult medical examination with abnormal findings: Secondary | ICD-10-CM | POA: Diagnosis not present

## 2019-03-17 DIAGNOSIS — R2681 Unsteadiness on feet: Secondary | ICD-10-CM | POA: Diagnosis not present

## 2019-03-17 DIAGNOSIS — W1839XA Other fall on same level, initial encounter: Secondary | ICD-10-CM | POA: Diagnosis not present

## 2019-03-17 DIAGNOSIS — M545 Low back pain: Secondary | ICD-10-CM | POA: Diagnosis not present

## 2019-04-09 DIAGNOSIS — R0902 Hypoxemia: Secondary | ICD-10-CM | POA: Diagnosis not present

## 2019-04-21 ENCOUNTER — Ambulatory Visit: Payer: Self-pay

## 2019-04-21 ENCOUNTER — Other Ambulatory Visit: Payer: Self-pay

## 2019-04-21 ENCOUNTER — Other Ambulatory Visit: Payer: Self-pay | Admitting: Podiatry

## 2019-04-21 ENCOUNTER — Ambulatory Visit (INDEPENDENT_AMBULATORY_CARE_PROVIDER_SITE_OTHER): Payer: Medicare Other

## 2019-04-21 ENCOUNTER — Encounter: Payer: Self-pay | Admitting: Podiatry

## 2019-04-21 ENCOUNTER — Ambulatory Visit: Payer: Medicare Other | Admitting: Podiatry

## 2019-04-21 VITALS — Temp 97.4°F | Resp 16 | Ht 68.0 in | Wt 190.0 lb

## 2019-04-21 DIAGNOSIS — S93324A Dislocation of tarsometatarsal joint of right foot, initial encounter: Secondary | ICD-10-CM

## 2019-04-21 DIAGNOSIS — J449 Chronic obstructive pulmonary disease, unspecified: Secondary | ICD-10-CM

## 2019-04-21 DIAGNOSIS — I429 Cardiomyopathy, unspecified: Secondary | ICD-10-CM | POA: Diagnosis not present

## 2019-04-21 DIAGNOSIS — M79671 Pain in right foot: Secondary | ICD-10-CM

## 2019-04-21 DIAGNOSIS — S92323A Displaced fracture of second metatarsal bone, unspecified foot, initial encounter for closed fracture: Secondary | ICD-10-CM | POA: Diagnosis not present

## 2019-04-21 DIAGNOSIS — S92333A Displaced fracture of third metatarsal bone, unspecified foot, initial encounter for closed fracture: Secondary | ICD-10-CM | POA: Diagnosis not present

## 2019-04-21 DIAGNOSIS — I25119 Atherosclerotic heart disease of native coronary artery with unspecified angina pectoris: Secondary | ICD-10-CM | POA: Diagnosis not present

## 2019-04-21 DIAGNOSIS — I739 Peripheral vascular disease, unspecified: Secondary | ICD-10-CM

## 2019-04-21 MED ORDER — OXYCODONE-ACETAMINOPHEN 10-325 MG PO TABS: 1.0000 | ORAL_TABLET | ORAL | 0 refills | Status: DC | PRN

## 2019-04-21 NOTE — Progress Notes (Signed)
Subjective:  Patient ID: Don [name], male    DOB: January 12, 1951,  MRN: [id number]  Chief Complaint  Patient presents with  . Foot Injury    Rt foot injury ( Leg gave away and fell on top of foot) x Sunday; 9/10 sharp constant pain -seen at ortho and was told had a fx and torn ligament Tx: oxy, icing,. and elevation -w/ swelling and bruised    68 y.o. male presents with the above complaint. Hx above confirmed with patient. Referred by ortho for evaluation.  Patient is also pending evaluation for possible Parkinson's disease.  Has extensive medical history including COPD, peripheral arterial disease with history of left lower extremity revascularization, coronary artery disease, hypertension, hyperlipidemia.  Review of Systems: Negative except as noted in the HPI. Denies N/V/F/Ch.  Past Medical History:  Diagnosis Date  . Arthritis   . Basal cell carcinoma   . Cancer ([place])    throat - 1997, throat - 2018  . COPD (chronic obstructive pulmonary disease) ([place])   . Coronary artery disease   . GERD (gastroesophageal reflux disease)   . Hyperlipidemia   . Hypertension   . Lesion of vocal cord   . Melanoma of back ([place])    melanoma on back  . Myocardial infarction ([place]) 2000   2 stents  . Peripheral vascular disease ([facility])    iliac artery clot  . Pneumonia 02/2015  . Wears glasses     Current Outpatient Medications:  .  ALPRAZolam (XANAX) 0.25 MG tablet, Take 0.25 mg by mouth 2 (two) times daily as needed for anxiety. , Disp: , Rfl:  .  amLODipine (NORVASC) 5 MG tablet, Take 1 tablet (5 mg total) by mouth daily., Disp: 30 tablet, Rfl: 11 .  aspirin 81 MG tablet, Take 162 mg by mouth daily. , Disp: , Rfl:  .  BREO ELLIPTA 100-25 MCG/INH AEPB, INHALE 1 PUFF BY MOUTH ONCE DAILY, Disp: , Rfl:  .  clopidogrel (PLAVIX) 75 MG tablet, TAKE 1 TABLET BY MOUTH ONCE DAILY, Disp: 30 tablet, Rfl: 10 .  cyclobenzaprine (FLEXERIL) 10 MG tablet, Take 10 mg by mouth 2 (two) times daily as needed  for muscle spasms. , Disp: , Rfl:  .  DULoxetine (CYMBALTA) 60 MG capsule, Take 60 mg by mouth 2 (two) times daily. , Disp: , Rfl:  .  gabapentin (NEURONTIN) 300 MG capsule, 2 (two) times daily. , Disp: , Rfl: 2 .  ipratropium-albuterol (DUONEB) 0.5-2.5 (3) MG/3ML SOLN, Take 3 mLs by nebulization every 6 (six) hours as needed (for shortness of breath/wheezing. (with bronchitis))., Disp: , Rfl:  .  metFORMIN (GLUCOPHAGE) 500 MG tablet, daily with breakfast. , Disp: , Rfl: 0 .  metoprolol (LOPRESSOR) 50 MG tablet, Take 50 mg by mouth 2 (two) times daily. , Disp: , Rfl:  .  omeprazole (PRILOSEC) 20 MG capsule, Take 20 mg by mouth daily., Disp: , Rfl:  .  ONETOUCH ULTRA test strip, USE 1 STRIP TO CHECK GLUCOSE TWICE DAILY, Disp: , Rfl:  .  rosuvastatin (CRESTOR) 20 MG tablet, daily. , Disp: , Rfl: 0 .  tamsulosin (FLOMAX) 0.4 MG CAPS capsule, TAKE 1 CAPSULE BY MOUTH ONCE DAILY AT BEDTIME, Disp: , Rfl:  .  TOUJEO SOLOSTAR 300 UNIT/ML SOPN, 30 Units daily. , Disp: , Rfl: 0 .  traZODone (DESYREL) 150 MG tablet, Take 150 mg by mouth at bedtime., Disp: , Rfl:  .  VENTOLIN HFA 108 (90 Base) MCG/ACT inhaler, prn, Disp: , Rfl: 0 .  oxyCODONE-acetaminophen (PERCOCET) 10-325 MG tablet, Take 1 tablet by mouth every 4 (four) hours as needed for pain., Disp: 12 tablet, Rfl: 0  Social History   Tobacco Use  Smoking Status Current Some Day Smoker  . Packs/day: 1.00  . Years: 50.00  . Pack years: 50.00  . Types: Cigarettes  Smokeless Tobacco Never Used    Allergies  Allergen Reactions  . Prednisone     Depressed mood - can tolerate it, but it makes him very irritable  . Doxycycline Rash  . Iodinated Diagnostic Agents Rash    Also developed blisters Also developed blisters  . Penicillins Rash    Has patient had a PCN reaction causing immediate rash, facial/tongue/throat swelling, SOB or lightheadedness with hypotension:unsure Has patient had a PCN reaction causing severe rash involving mucus membranes  or skin necrosis:unsure Has patient had a PCN reaction that required hospitalization:No Has patient had a PCN reaction occurring within the last 10 years:No If all of the above answers are "NO", then may proceed with Cephalosporin use.    Objective:   Vitals:   04/21/19 1503  Resp: 16  Temp: (!) 97.4 F (36.3 C)   Height:  5'9 Weight: 190 Body mass index is 28.89 kg/m. Constitutional Well developed. Well nourished.  Vascular Dorsalis pedis pulses palpable bilaterally. Posterior tibial pulses palpable bilaterally. Capillary refill normal to all digits.  No cyanosis or clubbing noted. Pedal hair growth normal.  Neurologic Normal speech. Oriented to person, place, and time. Epicritic sensation to light touch grossly present bilaterally.  Dermatologic Nails well groomed and normal in appearance. No open wounds. No skin lesions.  Orthopedic: POP right midfoot crepitus noted Edema visible deformity noted to the right midfoot with pain to palpation bruising   Radiographs: Referral x-rays reviewed severe Lisfranc dislocation noted Assessment:   1. Lisfranc dislocation, right, initial encounter   2. Right foot pain   3. Cardiomyopathy, secondary ([place])   4. Coronary artery disease involving native coronary artery of native heart with angina pectoris ([place])   5. Chronic obstructive pulmonary disease, unspecified COPD type ([place])   6. PAD (peripheral artery disease) ([place])    Plan:  Patient was evaluated and treated and all questions answered.  Lisfranc fracture dislocation right foot, diabetes with history of PAD status post contralateral leg revascularization, history of tobacco use disorder -Referral x-rays reviewed -Follow anesthetization with 20 cc of lidocaine 1% plain close reduction was performed.  X-rays taken showed successful reduction.  Reduction was again repeated and x-rays again taken and the foot was splinted. -Discussed with patient and his wife that due to his  comorbidities, surgery does pose significant risk.  We were able to get successful reduction today and I do think that this urgent surgery is not needed as the risk of tissue compromise is significantly reduced.  Will follow-up next week with repeat x-rays evaluate the alignment and discuss goals of surgery and discuss if we would like to proceed.  Patient suffers from right foot lisfranc fracture and needs to maintain NWB which impairs their ability to perform daily activities like bathing, dressing and toileting in the home.  A cane, crutch or walker will not resolve issue with performing activities of daily living. A wheelchair will allow patient to safely perform daily activities. Patient can safely propel the wheelchair in the home or has a caregiver who can provide assistance. Length of need 6 months . Accessories: elevating leg rests (ELRs), wheel locks, extensions and anti-tippers. Patient needs a bedside commode to  treat with the following condition: Fracture of second metatarsal bone of right foot  No follow-ups on file.

## 2019-04-21 NOTE — Progress Notes (Signed)
   Subjective:    Patient ID: Don [name], male    DOB: January 11, 1951, 68 y.o.   MRN: [id number]  HPI    Review of Systems  Musculoskeletal: Positive for arthralgias, gait problem, joint swelling and myalgias.  All other systems reviewed and are negative.      Objective:   Physical Exam        Assessment & Plan:

## 2019-04-24 ENCOUNTER — Telehealth: Payer: Self-pay | Admitting: *Deleted

## 2019-04-24 MED ORDER — OXYCODONE-ACETAMINOPHEN 10-325 MG PO TABS: 1.0000 | ORAL_TABLET | ORAL | 0 refills | Status: DC | PRN

## 2019-04-24 NOTE — Telephone Encounter (Signed)
Pt states he wants a wheelchair and bedside commode. I prepared the required [employer] DME form, and will fax with prepared clinicals from Dr. March Rummage and demographics to [place].

## 2019-04-24 NOTE — Telephone Encounter (Signed)
Pt's wife, Anne Ng called states pt has a severe dislocation of the 2nd metatarsal had has a lot of bruising and he is about to run out of his pain medication.

## 2019-04-24 NOTE — Telephone Encounter (Signed)
I spoke to Don [name] and told her it was not unusual to have severe bruising from a severe fracture, the change of color was due to the life cycle of blood in the bruise and it would begin to lighten. I told Don [name] I would inform Dr. March Rummage of pt's need for pain medication.

## 2019-04-25 NOTE — Telephone Encounter (Signed)
Faxed required DME form, clinical with Medicare statement and demographics to [employer], and emailed M. Stenson and A. Catron.

## 2019-04-26 DIAGNOSIS — J449 Chronic obstructive pulmonary disease, unspecified: Secondary | ICD-10-CM | POA: Diagnosis not present

## 2019-04-26 DIAGNOSIS — S93326A Dislocation of tarsometatarsal joint of unspecified foot, initial encounter: Secondary | ICD-10-CM | POA: Diagnosis not present

## 2019-04-28 ENCOUNTER — Other Ambulatory Visit: Payer: Self-pay

## 2019-04-28 ENCOUNTER — Ambulatory Visit (INDEPENDENT_AMBULATORY_CARE_PROVIDER_SITE_OTHER): Payer: Medicare Other

## 2019-04-28 ENCOUNTER — Ambulatory Visit (INDEPENDENT_AMBULATORY_CARE_PROVIDER_SITE_OTHER): Payer: Medicare Other | Admitting: Podiatry

## 2019-04-28 DIAGNOSIS — S93324A Dislocation of tarsometatarsal joint of right foot, initial encounter: Secondary | ICD-10-CM | POA: Diagnosis not present

## 2019-04-28 DIAGNOSIS — E1142 Type 2 diabetes mellitus with diabetic polyneuropathy: Secondary | ICD-10-CM | POA: Diagnosis not present

## 2019-04-28 DIAGNOSIS — M79671 Pain in right foot: Secondary | ICD-10-CM

## 2019-04-28 DIAGNOSIS — S62310D Displaced fracture of base of second metacarpal bone, right hand, subsequent encounter for fracture with routine healing: Secondary | ICD-10-CM | POA: Diagnosis not present

## 2019-04-28 DIAGNOSIS — R270 Ataxia, unspecified: Secondary | ICD-10-CM | POA: Diagnosis not present

## 2019-04-28 DIAGNOSIS — F0280 Dementia in other diseases classified elsewhere without behavioral disturbance: Secondary | ICD-10-CM | POA: Diagnosis not present

## 2019-04-28 DIAGNOSIS — E782 Mixed hyperlipidemia: Secondary | ICD-10-CM | POA: Diagnosis not present

## 2019-04-28 DIAGNOSIS — S92321A Displaced fracture of second metatarsal bone, right foot, initial encounter for closed fracture: Secondary | ICD-10-CM | POA: Diagnosis not present

## 2019-04-28 DIAGNOSIS — G255 Other chorea: Secondary | ICD-10-CM | POA: Diagnosis not present

## 2019-04-28 MED ORDER — OXYCODONE-ACETAMINOPHEN 10-325 MG PO TABS: 1.0000 | ORAL_TABLET | ORAL | 0 refills | Status: DC | PRN

## 2019-05-02 ENCOUNTER — Other Ambulatory Visit: Payer: Self-pay

## 2019-05-05 ENCOUNTER — Other Ambulatory Visit: Payer: Self-pay | Admitting: Podiatry

## 2019-05-05 ENCOUNTER — Other Ambulatory Visit: Payer: Self-pay

## 2019-05-05 ENCOUNTER — Encounter: Payer: Self-pay | Admitting: Podiatry

## 2019-05-05 ENCOUNTER — Ambulatory Visit (INDEPENDENT_AMBULATORY_CARE_PROVIDER_SITE_OTHER): Payer: Medicare Other

## 2019-05-05 ENCOUNTER — Ambulatory Visit (INDEPENDENT_AMBULATORY_CARE_PROVIDER_SITE_OTHER): Payer: Medicare Other | Admitting: Podiatry

## 2019-05-05 VITALS — Temp 97.5°F | Resp 16

## 2019-05-05 DIAGNOSIS — M79671 Pain in right foot: Secondary | ICD-10-CM

## 2019-05-05 DIAGNOSIS — R251 Tremor, unspecified: Secondary | ICD-10-CM | POA: Diagnosis not present

## 2019-05-05 DIAGNOSIS — R4189 Other symptoms and signs involving cognitive functions and awareness: Secondary | ICD-10-CM | POA: Diagnosis not present

## 2019-05-05 DIAGNOSIS — I25119 Atherosclerotic heart disease of native coronary artery with unspecified angina pectoris: Secondary | ICD-10-CM | POA: Diagnosis not present

## 2019-05-05 DIAGNOSIS — R296 Repeated falls: Secondary | ICD-10-CM | POA: Diagnosis not present

## 2019-05-05 DIAGNOSIS — S92321D Displaced fracture of second metatarsal bone, right foot, subsequent encounter for fracture with routine healing: Secondary | ICD-10-CM

## 2019-05-05 DIAGNOSIS — R42 Dizziness and giddiness: Secondary | ICD-10-CM | POA: Diagnosis not present

## 2019-05-05 DIAGNOSIS — R278 Other lack of coordination: Secondary | ICD-10-CM | POA: Diagnosis not present

## 2019-05-05 DIAGNOSIS — R270 Ataxia, unspecified: Secondary | ICD-10-CM | POA: Diagnosis not present

## 2019-05-05 DIAGNOSIS — R413 Other amnesia: Secondary | ICD-10-CM | POA: Diagnosis not present

## 2019-05-05 DIAGNOSIS — J449 Chronic obstructive pulmonary disease, unspecified: Secondary | ICD-10-CM

## 2019-05-05 DIAGNOSIS — I739 Peripheral vascular disease, unspecified: Secondary | ICD-10-CM

## 2019-05-05 MED ORDER — OXYCODONE-ACETAMINOPHEN 10-325 MG PO TABS: 1.0000 | ORAL_TABLET | ORAL | 0 refills | Status: DC | PRN

## 2019-05-07 ENCOUNTER — Other Ambulatory Visit: Payer: Self-pay

## 2019-05-08 NOTE — Progress Notes (Signed)
NEUROLOGY CONSULTATION NOTE  TOMISLAV MICALE MRN: [id number] DOB: [birth year]  Referring provider: [name], MD Primary care provider: [name], MD  Reason for consult:  Memory deficits, unsteady gait  HISTORY OF PRESENT ILLNESS: Don Aldape is a 68 year old Caucasian man with hypertension, CAD, COPD, peripheral vascular disease, arthritis and history of throat cancer, melanoma and throat cancer who presents for memory deficits and unsteady gait.  History supplemented by referring provider note.  He is accompanied by his wife who supplements history.  Since 2018, he has had trouble with balance.  Since November 2019, it started to gradually get worse.  When he stands and walks, he feels unsteady.  His legs feel weak and will often give out under him.  He has had several falls.  He broke his right foot 3 weeks ago as a result of a fall.  He also has had short term memory issues.  He frequently forgets conversations and repeats questions.  He used to handle the finances but his wife started paying the bills last year due to cognitive difficulties.  Sometimes either arm will shake or jerk uncontrollably.  It is not particularly related to any action or position of his arm.  He denies pain in the legs.  He has occasionally had urinary incontinence.  He has past history of cervical spine surgery but currently denies any neck pain.  He had an MRI of the brain without contrast on 05/05/19 which was personally reviewed and demonstrated global cerebral atrophy and mild chronic small vessel ischemic changes.  He denies family history of Alzheimer's disease or other neurodegenerative disorders.  PAST MEDICAL HISTORY: Past Medical History:  Diagnosis Date  . Arthritis   . Basal cell carcinoma   . Cancer ([place])    throat - 1997, throat - 2018  . COPD (chronic obstructive pulmonary disease) ([place])   . Coronary artery disease   . GERD (gastroesophageal reflux disease)   . Hyperlipidemia    . Hypertension   . Lesion of vocal cord   . Melanoma of back ([place])    melanoma on back  . Myocardial infarction ([place]) 2000   2 stents  . Peripheral vascular disease (HCC)    iliac artery clot  . Pneumonia 02/2015  . Wears glasses     PAST SURGICAL HISTORY: Past Surgical History:  Procedure Laterality Date  . ABDOMINAL ANGIOGRAM N/A 01/13/2015   Procedure: ABDOMINAL ANGIOGRAM;  Surgeon: Rosetta Posner, MD;  Location[facility] CATH LAB;  Service: Cardiovascular;  Laterality: N/A;  . APPENDECTOMY    . BACK SURGERY    . CARDIAC CATHETERIZATION  2000/2012   with stents in 2000  . CHOLECYSTECTOMY    . COLONOSCOPY    . ELBOW SURGERY     bilateral  . ESOPHAGOGASTRODUODENOSCOPY    . KNEE SURGERY Left   . MICROLARYNGOSCOPY WITH CO2 LASER AND EXCISION OF VOCAL CORD LESION N/A 11/23/2016   Procedure: MICROLARYNGOSCOPY  AND EXCISION OF VOCAL CORD LESION;  Surgeon: Melissa Montane, MD;  Location: [facility];  Service: ENT;  Laterality: N/A;  . MICROLARYNGOSCOPY WITH CO2 LASER AND EXCISION OF VOCAL CORD LESION N/A 01/11/2017   Procedure: MICROLARYNGOSCOPY WITH CO2 LASER AND EXCISION OF VOCAL CORD LESION;  Surgeon: Melissa Montane, MD;  Location: [facility];  Service: ENT;  Laterality: N/A;  . MICROLARYNGOSCOPY WITH LASER N/A 03/16/2015   Procedure: MICROLARYNGOSCOPY ;  Surgeon: Melissa Montane, MD;  Location: [place];  Service: ENT;  Laterality: N/A;  . peyronie's surgery    .  SHOULDER SURGERY Right    rotator cuff  . THROAT SURGERY  1997   cancer removed    MEDICATIONS: Current Outpatient Medications on File Prior to Visit  Medication Sig Dispense Refill  . ALPRAZolam (XANAX) 0.25 MG tablet Take 0.25 mg by mouth 2 (two) times daily as needed for anxiety.     Marland Kitchen amLODipine (NORVASC) 5 MG tablet Take 1 tablet (5 mg total) by mouth daily. 30 tablet 11  . aspirin 81 MG tablet Take 162 mg by mouth daily.     Marland Kitchen BREO ELLIPTA 100-25 MCG/INH AEPB INHALE 1 PUFF BY MOUTH ONCE DAILY    . clopidogrel (PLAVIX) 75 MG tablet TAKE 1 TABLET BY  MOUTH ONCE DAILY 30 tablet 10  . cyclobenzaprine (FLEXERIL) 10 MG tablet Take 10 mg by mouth 2 (two) times daily as needed for muscle spasms.     . DULoxetine (CYMBALTA) 60 MG capsule Take 60 mg by mouth 2 (two) times daily.     . Fluticasone-Umeclidin-Vilant (TRELEGY ELLIPTA) 100-62.5-25 MCG/INH AEPB Inhale 1 puff into the lungs daily. 60 each 11  . gabapentin (NEURONTIN) 300 MG capsule   2  . hydrochlorothiazide (HYDRODIURIL) 25 MG tablet     . ipratropium-albuterol (DUONEB) 0.5-2.5 (3) MG/3ML SOLN Take 3 mLs by nebulization every 6 (six) hours as needed (for shortness of breath/wheezing. (with bronchitis)).    . metFORMIN (GLUCOPHAGE) 500 MG tablet   0  . metoprolol (LOPRESSOR) 50 MG tablet Take 50 mg by mouth 2 (two) times daily.     . Multiple Vitamins-Minerals (PRESERVISION AREDS 2 PO) Take 2 tablets by mouth daily.    Marland Kitchen omeprazole (PRILOSEC) 20 MG capsule Take 20 mg by mouth daily.    Glory Rosebush ULTRA test strip USE 1 STRIP TO CHECK GLUCOSE TWICE DAILY    . oxyCODONE (OXY IR/ROXICODONE) 5 MG immediate release tablet TAKE 1 OR 2 TABLETS BY MOUTH EVERY 4 TO 6 HOURS AS NEEDED    . oxyCODONE-acetaminophen (PERCOCET) 10-325 MG tablet Take 1 tablet by mouth every 4 (four) hours as needed for pain. 12 tablet 0  . rosuvastatin (CRESTOR) 20 MG tablet   0  . tamsulosin (FLOMAX) 0.4 MG CAPS capsule TAKE 1 CAPSULE BY MOUTH ONCE DAILY AT BEDTIME    . TOUJEO SOLOSTAR 300 UNIT/ML SOPN   0  . traZODone (DESYREL) 150 MG tablet Take 150 mg by mouth at bedtime.    . VENTOLIN HFA 108 (90 Base) MCG/ACT inhaler   0   No current facility-administered medications on file prior to visit.     ALLERGIES: Allergies  Allergen Reactions  . Prednisone     Depressed mood - can tolerate it, but it makes him very irritable  . Doxycycline Rash  . Iodinated Diagnostic Agents Rash    Also developed blisters Also developed blisters  . Penicillins Rash    Has patient had a PCN reaction causing immediate rash,  facial/tongue/throat swelling, SOB or lightheadedness with hypotension:unsure Has patient had a PCN reaction causing severe rash involving mucus membranes or skin necrosis:unsure Has patient had a PCN reaction that required hospitalization:No Has patient had a PCN reaction occurring within the last 10 years:No If all of the above answers are "NO", then may proceed with Cephalosporin use.     FAMILY HISTORY: Family History  Problem Relation Age of Onset  . Cancer Mother        bone  . Stroke Father     SOCIAL HISTORY: Social History   Socioeconomic History  .  Marital status: Married    Spouse name: Not on file  . Number of children: Not on file  . Years of education: Not on file  . Highest education level: Not on file  Occupational History  . Not on file  Social Needs  . Financial resource strain: Not on file  . Food insecurity    Worry: Not on file    Inability: Not on file  . Transportation needs    Medical: Not on file    Non-medical: Not on file  Tobacco Use  . Smoking status: Current Some Day Smoker    Packs/day: 0.50    Years: 50.00    Pack years: 25.00    Types: Cigarettes  . Smokeless tobacco: Never Used  Substance and Sexual Activity  . Alcohol use: No    Alcohol/week: 0.0 standard drinks  . Drug use: No  . Sexual activity: Not on file  Lifestyle  . Physical activity    Days per week: Not on file    Minutes per session: Not on file  . Stress: Not on file  Relationships  . Social Herbalist on phone: Not on file    Gets together: Not on file    Attends religious service: Not on file    Active member of club or organization: Not on file    Attends meetings of clubs or organizations: Not on file    Relationship status: Not on file  . Intimate partner violence    Fear of current or ex partner: Not on file    Emotionally abused: Not on file    Physically abused: Not on file    Forced sexual activity: Not on file  Other Topics Concern  .  Not on file  Social History Narrative  . Not on file    REVIEW OF SYSTEMS: Constitutional: No fevers, chills, or sweats, no generalized fatigue, change in appetite Eyes: No visual changes, double vision, eye pain Ear, nose and throat: No hearing loss, ear pain, nasal congestion, sore throat Cardiovascular: No chest pain, palpitations Respiratory:  No shortness of breath at rest or with exertion, wheezes GastrointestinaI: No nausea, vomiting, diarrhea, abdominal pain, fecal incontinence Genitourinary:  No dysuria, urinary retention or frequency Musculoskeletal:  No neck pain, back pain Integumentary: No rash, pruritus, skin lesions Neurological: as above Psychiatric: No depression, insomnia, anxiety Endocrine: No palpitations, fatigue, diaphoresis, mood swings, change in appetite, change in weight, increased thirst Hematologic/Lymphatic:  No purpura, petechiae. Allergic/Immunologic: no itchy/runny eyes, nasal congestion, recent allergic reactions, rashes  PHYSICAL EXAM: Blood pressure 124/72, pulse 75, height 5\' 9"  (1.753 m), weight 190 lb (86.2 kg), SpO2 94 %. General: No acute distress.  Patient appears well-groomed.  Head:  Normocephalic/atraumatic Eyes:  fundi examined but not visualized Neck: supple, no paraspinal tenderness, full range of motion Back: No paraspinal tenderness Heart: regular rate and rhythm Lungs: Clear to auscultation bilaterally. Vascular: No carotid bruits. Neurological Exam: Mental status: alert and oriented to person, place, and time, recent memory reduced, remote memory intact, fund of knowledge intact, attention and concentration intact, speech fluent and not dysarthric, language intact except for impaired naming fluency. Montreal Cognitive Assessment Blind 05/07/2019  Attention: Read list of digits (0/2) 1  Attention: Read list of letters (0/1) 1  Attention: Serial 7 subtraction starting at 100 (0/3) 2  Language: Repeat phrase (0/2) 2  Language :  Fluency (0/1) 0  Abstraction (0/2) 0  Delayed Recall (0/5) 2  Orientation (0/6) 6  Total  14  Total             15 (+ 1 for education)  Cranial nerves: CN I: not tested CN II: pupils equal, round and reactive to light, visual fields intact CN III, IV, VI:  full range of motion, some difficulty tracking my finger with his eyes, no nystagmus, no ptosis CN V: facial sensation intact CN VII: upper and lower face symmetric CN VIII: hearing intact CN IX, X: gag intact, uvula midline CN XI: sternocleidomastoid and trapezius muscles intact CN XII: tongue midline Bulk & Tone: normal, no fasciculations. Motor:  5/5 throughout Sensation:  Pinprick sensation reduced in feet up to shin and in hands bilaterally; and vibration sensation intact. Deep Tendon Reflexes:  3+ throughout except absent in ankles, toes downgoing. Finger to nose testing:  Without dysmetria. Gait:  In wheelchair with cast on his right foot.  Unable to stand and ambulate.  IMPRESSION: 1.  Cognitive impairment 2.  Unsteady gait, bilateral leg weakness, possible myoclonus  PLAN: 1.  Check MRI of cervical spine looking for any structural etiology causing myelopathy 2.  Check B12, TSH, RPR 3.  Further recommendations pending results.  Thank you for allowing me to take part in the care of this patient.  Metta Clines, DO  CC:  [name], MD

## 2019-05-09 ENCOUNTER — Other Ambulatory Visit: Payer: Self-pay

## 2019-05-09 ENCOUNTER — Ambulatory Visit (INDEPENDENT_AMBULATORY_CARE_PROVIDER_SITE_OTHER): Payer: Medicare Other | Admitting: Neurology

## 2019-05-09 ENCOUNTER — Other Ambulatory Visit (INDEPENDENT_AMBULATORY_CARE_PROVIDER_SITE_OTHER): Payer: Medicare Other

## 2019-05-09 ENCOUNTER — Encounter: Payer: Self-pay | Admitting: Neurology

## 2019-05-09 VITALS — BP 124/72 | HR 75 | Ht 69.0 in | Wt 190.0 lb

## 2019-05-09 DIAGNOSIS — R2681 Unsteadiness on feet: Secondary | ICD-10-CM

## 2019-05-09 DIAGNOSIS — R4181 Age-related cognitive decline: Secondary | ICD-10-CM

## 2019-05-09 DIAGNOSIS — R296 Repeated falls: Secondary | ICD-10-CM

## 2019-05-09 DIAGNOSIS — R4189 Other symptoms and signs involving cognitive functions and awareness: Secondary | ICD-10-CM

## 2019-05-09 DIAGNOSIS — R531 Weakness: Secondary | ICD-10-CM | POA: Diagnosis not present

## 2019-05-09 DIAGNOSIS — R292 Abnormal reflex: Secondary | ICD-10-CM

## 2019-05-09 NOTE — Patient Instructions (Addendum)
1.  We will check MRI of cervical spine to evaluate for any structural cause of falls, leg weakness and jerking movements. 2.  We will also check B12, TSH, RPR 3.  I will review the MRI brain report  4.  Further recommendations pending results. 5.  Follow up  We have sent a referral to [facility] for your MRI and they will call you directly to schedule your appointment. They are located at [address]. If you need to contact them directly please call [phone].  Your provider has requested that you have labwork completed today. Please go to [facility] ([id number]) on the second floor of this building before leaving the office today. You do not need to check in. If you are not called within 15 minutes please check with the front desk.

## 2019-05-10 DIAGNOSIS — R0902 Hypoxemia: Secondary | ICD-10-CM | POA: Diagnosis not present

## 2019-05-12 ENCOUNTER — Other Ambulatory Visit: Payer: Self-pay | Admitting: Podiatry

## 2019-05-12 DIAGNOSIS — M79671 Pain in right foot: Secondary | ICD-10-CM

## 2019-05-12 DIAGNOSIS — R4189 Other symptoms and signs involving cognitive functions and awareness: Secondary | ICD-10-CM

## 2019-05-12 LAB — TSH: TSH: 2.3 mIU/L (ref 0.40–4.50)

## 2019-05-12 LAB — VITAMIN B12: Vitamin B-12: 324 pg/mL (ref 200–1100)

## 2019-05-12 LAB — RPR: RPR Ser Ql: NONREACTIVE

## 2019-05-12 NOTE — Progress Notes (Signed)
Subjective:  Patient ID: Don [name], male    DOB: 1951-06-01,  MRN: [id number]  Chief Complaint  Patient presents with  . Fracture    F/U Rt foot fx Pt. states," pain has been doing goot, better thant last time; 7/10 pain (intermittent) tx: cam boot, wheelchari, PRN meds, icing and elevbaton    68 y.o. male presents with the above complaint. Hx above confirmed with patient. Referred by ortho for evaluation.  Patient states his pain is been doing a lot better.  Still having some pain the pain medicine helps.  Here for further discussion of possible surgery.  Review of Systems: Negative except as noted in the HPI. Denies N/V/F/Ch.  Past Medical History:  Diagnosis Date  . Arthritis   . Basal cell carcinoma   . Cancer ([place])    throat - 1997, throat - 2018  . COPD (chronic obstructive pulmonary disease) ([place])   . Coronary artery disease   . GERD (gastroesophageal reflux disease)   . Hyperlipidemia   . Hypertension   . Lesion of vocal cord   . Melanoma of back ([place])    melanoma on back  . Myocardial infarction ([place]) 2000   2 stents  . Peripheral vascular disease (H[facility])    iliac artery clot  . Pneumonia 02/2015  . Wears glasses     Current Outpatient Medications:  .  ALPRAZolam (XANAX) 0.25 MG tablet, Take 0.25 mg by mouth 2 (two) times daily as needed for anxiety. , Disp: , Rfl:  .  amLODipine (NORVASC) 5 MG tablet, Take 1 tablet (5 mg total) by mouth daily., Disp: 30 tablet, Rfl: 11 .  aspirin 81 MG tablet, Take 162 mg by mouth daily. , Disp: , Rfl:  .  BREO ELLIPTA 100-25 MCG/INH AEPB, INHALE 1 PUFF BY MOUTH ONCE DAILY, Disp: , Rfl:  .  clopidogrel (PLAVIX) 75 MG tablet, TAKE 1 TABLET BY MOUTH ONCE DAILY, Disp: 30 tablet, Rfl: 10 .  cyclobenzaprine (FLEXERIL) 10 MG tablet, Take 10 mg by mouth 2 (two) times daily as needed for muscle spasms. , Disp: , Rfl:  .  DULoxetine (CYMBALTA) 60 MG capsule, Take 60 mg by mouth 2 (two) times daily. , Disp: , Rfl:  .  gabapentin  (NEURONTIN) 300 MG capsule, 2 (two) times daily. , Disp: , Rfl: 2 .  ipratropium-albuterol (DUONEB) 0.5-2.5 (3) MG/3ML SOLN, Take 3 mLs by nebulization every 6 (six) hours as needed (for shortness of breath/wheezing. (with bronchitis))., Disp: , Rfl:  .  metFORMIN (GLUCOPHAGE) 500 MG tablet, daily with breakfast. , Disp: , Rfl: 0 .  metoprolol (LOPRESSOR) 50 MG tablet, Take 50 mg by mouth 2 (two) times daily. , Disp: , Rfl:  .  omeprazole (PRILOSEC) 20 MG capsule, Take 20 mg by mouth daily., Disp: , Rfl:  .  ONETOUCH ULTRA test strip, USE 1 STRIP TO CHECK GLUCOSE TWICE DAILY, Disp: , Rfl:  .  oxyCODONE-acetaminophen (PERCOCET) 10-325 MG tablet, Take 1 tablet by mouth every 4 (four) hours as needed for pain., Disp: 12 tablet, Rfl: 0 .  rosuvastatin (CRESTOR) 20 MG tablet, daily. , Disp: , Rfl: 0 .  tamsulosin (FLOMAX) 0.4 MG CAPS capsule, TAKE 1 CAPSULE BY MOUTH ONCE DAILY AT BEDTIME, Disp: , Rfl:  .  TOUJEO SOLOSTAR 300 UNIT/ML SOPN, 30 Units daily. , Disp: , Rfl: 0 .  traZODone (DESYREL) 150 MG tablet, Take 150 mg by mouth at bedtime., Disp: , Rfl:  .  VENTOLIN HFA 108 (90 Base) MCG/ACT inhaler,  prn, Disp: , Rfl: 0  Social History   Tobacco Use  Smoking Status Current Some Day Smoker  . Packs/day: 1.00  . Years: 50.00  . Pack years: 50.00  . Types: Cigarettes  Smokeless Tobacco Never Used    Allergies  Allergen Reactions  . Prednisone     Depressed mood - can tolerate it, but it makes him very irritable  . Doxycycline Rash  . Iodinated Diagnostic Agents Rash    Also developed blisters Also developed blisters  . Penicillins Rash    Has patient had a PCN reaction causing immediate rash, facial/tongue/throat swelling, SOB or lightheadedness with hypotension:unsure Has patient had a PCN reaction causing severe rash involving mucus membranes or skin necrosis:unsure Has patient had a PCN reaction that required hospitalization:No Has patient had a PCN reaction occurring within the last  10 years:No If all of the above answers are "NO", then may proceed with Cephalosporin use.    Objective:   Vitals:   05/05/19 0932  Resp: 16  Temp: (!) 97.5 F (36.4 C)   Height:  5'9 Weight: 190 There is no height or weight on file to calculate BMI. Constitutional Well developed. Well nourished.  Vascular Dorsalis pedis pulses palpable bilaterally. Posterior tibial pulses palpable bilaterally. Capillary refill normal to all digits.  No cyanosis or clubbing noted. Pedal hair growth normal.  Neurologic Normal speech. Oriented to person, place, and time. Epicritic sensation to light touch grossly present bilaterally.  Dermatologic Nails well groomed and normal in appearance. No open wounds. No skin lesions.  Orthopedic: Reduced edema mild pain palpation about the right midfoot   Radiographs: No x-rays taken today Assessment:   1. Closed displaced fracture of second metatarsal bone of right foot with routine healing, subsequent encounter   2. Right foot pain   3. Coronary artery disease involving native coronary artery of native heart with angina pectoris ([place])   4. Chronic obstructive pulmonary disease, unspecified COPD type ([place])   5. PAD (peripheral artery disease) ([place])    Plan:  Patient was evaluated and treated and all questions answered.  Lisfranc fracture dislocation right foot, diabetes with history of PAD status post contralateral leg revascularization, history of tobacco use disorder -Lengthy discussion had with patient about risk and benefits of surgery versus risks of not doing surgery.  Discussed with patient and his wife that due to his extensive medical history he is a poor surgical candidate and is at high risk of complications. Offered minimally invasive option including open reduction with pinning of the metatarsals as a possible alternative.  Discussed risks of proceeding with open reduction internal fixation including risk of infection nonhealing possible  loss of limb. Discussed that in absence of surgery likely patient will go on to have arthritis.  Discussed possibility of lack of healing without surgery.  After weighing all the options patient and his wife elect at this time to not proceed with surgery.  Discussed that should he have pain later we could consider further surgical intervention.  Discussed should his fracture be slow to heal we could also revisit the idea of surgical intervention.  Discussed with or without surgery he will be nonweightbearing for the next 6 to 8 weeks -Rx Percocet for pain -Cast applied right foot with 3 rolls fiberglass casting.  Return in about 2 weeks (around 05/19/2019) for Cast change right foot with XRs.

## 2019-05-12 NOTE — Progress Notes (Addendum)
Subjective:  Patient ID: Don [name], male    DOB: [birth year],  MRN: [id number]  No chief complaint on file.   68 y.o. male presents for follow-up right Lisfranc injury.  States the pain is doing much better.  Review of Systems: Negative except as noted in the HPI. Denies N/V/F/Ch.  Past Medical History:  Diagnosis Date  . Arthritis   . Basal cell carcinoma   . Cancer ([place])    throat - 1997, throat - 2018  . COPD (chronic obstructive pulmonary disease) ([place])   . Coronary artery disease   . GERD (gastroesophageal reflux disease)   . Hyperlipidemia   . Hypertension   . Lesion of vocal cord   . Melanoma of back ([place])    melanoma on back  . Myocardial infarction ([place]) 2000   2 stents  . Peripheral vascular disease ([facility])    iliac artery clot  . Pneumonia 02/2015  . Wears glasses     Current Outpatient Medications:  .  ALPRAZolam (XANAX) 0.25 MG tablet, Take 0.25 mg by mouth 2 (two) times daily as needed for anxiety. , Disp: , Rfl:  .  amLODipine (NORVASC) 5 MG tablet, Take 1 tablet (5 mg total) by mouth daily., Disp: 30 tablet, Rfl: 11 .  aspirin 81 MG tablet, Take 162 mg by mouth daily. , Disp: , Rfl:  .  BREO ELLIPTA 100-25 MCG/INH AEPB, INHALE 1 PUFF BY MOUTH ONCE DAILY, Disp: , Rfl:  .  clopidogrel (PLAVIX) 75 MG tablet, TAKE 1 TABLET BY MOUTH ONCE DAILY, Disp: 30 tablet, Rfl: 10 .  cyclobenzaprine (FLEXERIL) 10 MG tablet, Take 10 mg by mouth 2 (two) times daily as needed for muscle spasms. , Disp: , Rfl:  .  DULoxetine (CYMBALTA) 60 MG capsule, Take 60 mg by mouth 2 (two) times daily. , Disp: , Rfl:  .  gabapentin (NEURONTIN) 300 MG capsule, 2 (two) times daily. , Disp: , Rfl: 2 .  ipratropium-albuterol (DUONEB) 0.5-2.5 (3) MG/3ML SOLN, Take 3 mLs by nebulization every 6 (six) hours as needed (for shortness of breath/wheezing. (with bronchitis))., Disp: , Rfl:  .  metFORMIN (GLUCOPHAGE) 500 MG tablet, daily with breakfast. , Disp: , Rfl: 0 .  metoprolol (LOPRESSOR)  50 MG tablet, Take 50 mg by mouth 2 (two) times daily. , Disp: , Rfl:  .  omeprazole (PRILOSEC) 20 MG capsule, Take 20 mg by mouth daily., Disp: , Rfl:  .  ONETOUCH ULTRA test strip, USE 1 STRIP TO CHECK GLUCOSE TWICE DAILY, Disp: , Rfl:  .  oxyCODONE-acetaminophen (PERCOCET) 10-325 MG tablet, Take 1 tablet by mouth every 4 (four) hours as needed for pain., Disp: 12 tablet, Rfl: 0 .  rosuvastatin (CRESTOR) 20 MG tablet, daily. , Disp: , Rfl: 0 .  tamsulosin (FLOMAX) 0.4 MG CAPS capsule, TAKE 1 CAPSULE BY MOUTH ONCE DAILY AT BEDTIME, Disp: , Rfl:  .  TOUJEO SOLOSTAR 300 UNIT/ML SOPN, 30 Units daily. , Disp: , Rfl: 0 .  traZODone (DESYREL) 150 MG tablet, Take 150 mg by mouth at bedtime., Disp: , Rfl:  .  VENTOLIN HFA 108 (90 Base) MCG/ACT inhaler, prn, Disp: , Rfl: 0  Social History   Tobacco Use  Smoking Status Current Some Day Smoker  . Packs/day: 1.00  . Years: 50.00  . Pack years: 50.00  . Types: Cigarettes  Smokeless Tobacco Never Used    Allergies  Allergen Reactions  . Prednisone     Depressed mood - can tolerate it, but it makes  him very irritable  . Doxycycline Rash  . Iodinated Diagnostic Agents Rash    Also developed blisters Also developed blisters  . Penicillins Rash    Has patient had a PCN reaction causing immediate rash, facial/tongue/throat swelling, SOB or lightheadedness with hypotension:unsure Has patient had a PCN reaction causing severe rash involving mucus membranes or skin necrosis:unsure Has patient had a PCN reaction that required hospitalization:No Has patient had a PCN reaction occurring within the last 10 years:No If all of the above answers are "NO", then may proceed with Cephalosporin use.    Objective:   There were no vitals filed for this visit. Height:  5'9 Weight: 190 There is no height or weight on file to calculate BMI. Constitutional Well developed. Well nourished.  Vascular Dorsalis pedis pulses palpable bilaterally. Posterior tibial  pulses palpable bilaterally. Capillary refill normal to all digits.  No cyanosis or clubbing noted. Pedal hair growth normal.  Neurologic Normal speech. Oriented to person, place, and time. Epicritic sensation to light touch grossly present bilaterally.  Dermatologic Nails well groomed and normal in appearance. No open wounds. No skin lesions.  Orthopedic: POP right midfoot noted Reduce edema skin wrinkles not returned, slight contusion right dorsal midfoot   Radiographs: Taken and reviewed.  No interval displacement.  Acceptable alignment with only mild displacement of the second metatarsal fracture Assessment:   1. Closed displaced fracture of base of second metacarpal bone of right hand with routine healing, subsequent encounter   2. Right foot pain    Plan:  Patient was evaluated and treated and all questions answered.  Lisfranc fracture dislocation right foot, diabetes with history of PAD status post contralateral leg revascularization, history of tobacco use disorder -Swelling down significantly.  Posterior splint intact.  X-rays taken without interval displacement.  No adjustment today.  Follow-up next week for recheck possible further discussion of surgery.  Percocet refilled.  Return in about 1 week (around 05/05/2019) for Post-op.

## 2019-05-19 ENCOUNTER — Encounter: Payer: Self-pay | Admitting: Podiatry

## 2019-05-19 ENCOUNTER — Telehealth: Payer: Self-pay | Admitting: Neurology

## 2019-05-19 ENCOUNTER — Other Ambulatory Visit: Payer: Self-pay | Admitting: Podiatry

## 2019-05-19 ENCOUNTER — Other Ambulatory Visit: Payer: Self-pay

## 2019-05-19 ENCOUNTER — Ambulatory Visit (INDEPENDENT_AMBULATORY_CARE_PROVIDER_SITE_OTHER): Payer: Medicare Other | Admitting: Podiatry

## 2019-05-19 ENCOUNTER — Ambulatory Visit (INDEPENDENT_AMBULATORY_CARE_PROVIDER_SITE_OTHER): Payer: Medicare Other

## 2019-05-19 VITALS — Temp 97.0°F | Resp 16

## 2019-05-19 DIAGNOSIS — S92321D Displaced fracture of second metatarsal bone, right foot, subsequent encounter for fracture with routine healing: Secondary | ICD-10-CM

## 2019-05-19 DIAGNOSIS — M779 Enthesopathy, unspecified: Secondary | ICD-10-CM | POA: Diagnosis not present

## 2019-05-19 DIAGNOSIS — S93324A Dislocation of tarsometatarsal joint of right foot, initial encounter: Secondary | ICD-10-CM | POA: Diagnosis not present

## 2019-05-19 DIAGNOSIS — M79672 Pain in left foot: Secondary | ICD-10-CM

## 2019-05-19 DIAGNOSIS — Z9889 Other specified postprocedural states: Secondary | ICD-10-CM

## 2019-05-19 DIAGNOSIS — M778 Other enthesopathies, not elsewhere classified: Secondary | ICD-10-CM

## 2019-05-19 NOTE — Telephone Encounter (Signed)
New Message  ° ° ° °Returning call to get lab results. °

## 2019-05-19 NOTE — Telephone Encounter (Signed)
Called and spoke with Anne Ng, advised her of results.

## 2019-05-20 ENCOUNTER — Other Ambulatory Visit: Payer: Self-pay | Admitting: Podiatry

## 2019-05-20 DIAGNOSIS — S92321D Displaced fracture of second metatarsal bone, right foot, subsequent encounter for fracture with routine healing: Secondary | ICD-10-CM

## 2019-05-20 DIAGNOSIS — M79672 Pain in left foot: Secondary | ICD-10-CM

## 2019-05-20 DIAGNOSIS — Z9889 Other specified postprocedural states: Secondary | ICD-10-CM

## 2019-05-26 DIAGNOSIS — S93326A Dislocation of tarsometatarsal joint of unspecified foot, initial encounter: Secondary | ICD-10-CM | POA: Diagnosis not present

## 2019-05-26 DIAGNOSIS — J449 Chronic obstructive pulmonary disease, unspecified: Secondary | ICD-10-CM | POA: Diagnosis not present

## 2019-05-31 ENCOUNTER — Other Ambulatory Visit: Payer: Self-pay

## 2019-05-31 ENCOUNTER — Ambulatory Visit
Admission: RE | Admit: 2019-05-31 | Discharge: 2019-05-31 | Disposition: A | Discharge status: Home / Self Care | Payer: Medicare Other | Source: Ambulatory Visit | Attending: Neurology | Admitting: Neurology

## 2019-05-31 DIAGNOSIS — R531 Weakness: Secondary | ICD-10-CM

## 2019-05-31 DIAGNOSIS — R4189 Other symptoms and signs involving cognitive functions and awareness: Secondary | ICD-10-CM

## 2019-05-31 DIAGNOSIS — R4181 Age-related cognitive decline: Secondary | ICD-10-CM

## 2019-05-31 DIAGNOSIS — M4802 Spinal stenosis, cervical region: Secondary | ICD-10-CM | POA: Diagnosis not present

## 2019-05-31 DIAGNOSIS — R296 Repeated falls: Secondary | ICD-10-CM

## 2019-05-31 DIAGNOSIS — R2681 Unsteadiness on feet: Secondary | ICD-10-CM

## 2019-05-31 DIAGNOSIS — R292 Abnormal reflex: Secondary | ICD-10-CM

## 2019-06-02 ENCOUNTER — Ambulatory Visit: Payer: Medicare Other | Admitting: Podiatry

## 2019-06-03 ENCOUNTER — Telehealth: Payer: Self-pay

## 2019-06-03 ENCOUNTER — Telehealth: Payer: Self-pay | Admitting: Neurology

## 2019-06-03 NOTE — Telephone Encounter (Signed)
Called and advised Pt's wife of results. See imaging study note.

## 2019-06-03 NOTE — Telephone Encounter (Signed)
-----   Message from Pieter Partridge, DO sent at 06/03/2019  2:04 PM EDT ----- MRI shows a slight bulging disc in the neck that is mildly touching the cord but nothing significant that would be causing problems in the legs.  He may simply have neuropathy in the feet.  If agreeable, we can proceed with a nerve study of the legs.

## 2019-06-03 NOTE — Telephone Encounter (Signed)
Wife is calling in about MRI Results.

## 2019-06-03 NOTE — Telephone Encounter (Signed)
Called and spoke with Pt's wife, Anne Ng. They are agreeable to scheduling EMG. She is expecting call to schedule.

## 2019-06-04 NOTE — Telephone Encounter (Signed)
Patient is scheduled for EMG on 8/18 at 10:15AM

## 2019-06-08 NOTE — Progress Notes (Signed)
Subjective:  Patient ID: Don [name], male    DOB: 29-Jun-1951,  MRN: [id number]  Chief Complaint  Patient presents with  . Foot Injury     Cast change and linsfrac fx and dislocation. Pt. states," no pain, doing fine." Tx: percocoet -pt denies N./V/F/Ch     68 y.o. male presents with the above complaint.  States the right foot is doing much better not having any pain  New complaint today of left foot pain just wanted to get checked out make sure that there is no injury to the left foot as well.   Review of Systems: Negative except as noted in the HPI. Denies N/V/F/Ch.  Past Medical History:  Diagnosis Date  . Arthritis   . Basal cell carcinoma   . Cancer ([place])    throat - 1997, throat - 2018  . COPD (chronic obstructive pulmonary disease) ([place])   . Coronary artery disease   . GERD (gastroesophageal reflux disease)   . Hyperlipidemia   . Hypertension   . Lesion of vocal cord   . Melanoma of back ([place])    melanoma on back  . Myocardial infarction ([place]) 2000   2 stents  . Peripheral vascular disease ([facility])    iliac artery clot  . Pneumonia 02/2015  . Wears glasses     Current Outpatient Medications:  .  ALPRAZolam (XANAX) 0.25 MG tablet, Take 0.25 mg by mouth 2 (two) times daily as needed for anxiety. , Disp: , Rfl:  .  amLODipine (NORVASC) 5 MG tablet, Take 1 tablet (5 mg total) by mouth daily., Disp: 30 tablet, Rfl: 11 .  aspirin 81 MG tablet, Take 162 mg by mouth daily. , Disp: , Rfl:  .  BREO ELLIPTA 100-25 MCG/INH AEPB, INHALE 1 PUFF BY MOUTH ONCE DAILY, Disp: , Rfl:  .  clopidogrel (PLAVIX) 75 MG tablet, TAKE 1 TABLET BY MOUTH ONCE DAILY, Disp: 30 tablet, Rfl: 10 .  cyclobenzaprine (FLEXERIL) 10 MG tablet, Take 10 mg by mouth 2 (two) times daily as needed for muscle spasms. , Disp: , Rfl:  .  DULoxetine (CYMBALTA) 60 MG capsule, Take 60 mg by mouth 2 (two) times daily. , Disp: , Rfl:  .  gabapentin (NEURONTIN) 300 MG capsule, 2 (two) times daily. , Disp: , Rfl:  2 .  ipratropium-albuterol (DUONEB) 0.5-2.5 (3) MG/3ML SOLN, Take 3 mLs by nebulization every 6 (six) hours as needed (for shortness of breath/wheezing. (with bronchitis))., Disp: , Rfl:  .  metFORMIN (GLUCOPHAGE) 500 MG tablet, daily with breakfast. , Disp: , Rfl: 0 .  metoprolol (LOPRESSOR) 50 MG tablet, Take 50 mg by mouth 2 (two) times daily. , Disp: , Rfl:  .  omeprazole (PRILOSEC) 20 MG capsule, Take 20 mg by mouth daily., Disp: , Rfl:  .  ONETOUCH ULTRA test strip, USE 1 STRIP TO CHECK GLUCOSE TWICE DAILY, Disp: , Rfl:  .  oxyCODONE-acetaminophen (PERCOCET) 10-325 MG tablet, Take 1 tablet by mouth every 4 (four) hours as needed for pain., Disp: 12 tablet, Rfl: 0 .  rosuvastatin (CRESTOR) 20 MG tablet, daily. , Disp: , Rfl: 0 .  SPIRIVA RESPIMAT 2.5 MCG/ACT AERS, , Disp: , Rfl:  .  tamsulosin (FLOMAX) 0.4 MG CAPS capsule, TAKE 1 CAPSULE BY MOUTH ONCE DAILY AT BEDTIME, Disp: , Rfl:  .  TOUJEO SOLOSTAR 300 UNIT/ML SOPN, 30 Units daily. , Disp: , Rfl: 0 .  traZODone (DESYREL) 150 MG tablet, Take 150 mg by mouth at bedtime., Disp: , Rfl:  .  VENTOLIN HFA 108 (90 Base) MCG/ACT inhaler, prn, Disp: , Rfl: 0  Social History   Tobacco Use  Smoking Status Current Some Day Smoker  . Packs/day: 1.00  . Years: 50.00  . Pack years: 50.00  . Types: Cigarettes  Smokeless Tobacco Never Used    Allergies  Allergen Reactions  . Prednisone     Depressed mood - can tolerate it, but it makes him very irritable  . Doxycycline Rash  . Iodinated Diagnostic Agents Rash    Also developed blisters Also developed blisters  . Penicillins Rash    Has patient had a PCN reaction causing immediate rash, facial/tongue/throat swelling, SOB or lightheadedness with hypotension:unsure Has patient had a PCN reaction causing severe rash involving mucus membranes or skin necrosis:unsure Has patient had a PCN reaction that required hospitalization:No Has patient had a PCN reaction occurring within the last 10  years:No If all of the above answers are "NO", then may proceed with Cephalosporin use.    Objective:   Vitals:   05/19/19 0834  Resp: 16  Temp: (!) 97 F (36.1 C)   Height:  5'9 Weight: 190 There is no height or weight on file to calculate BMI. Constitutional Well developed. Well nourished.  Vascular Dorsalis pedis pulses palpable bilaterally. Posterior tibial pulses palpable bilaterally. Capillary refill normal to all digits.  No cyanosis or clubbing noted. Pedal hair growth normal.  Neurologic Normal speech. Oriented to person, place, and time. Epicritic sensation to light touch grossly present bilaterally.  Dermatologic Nails well groomed and normal in appearance. No open wounds. No skin lesions.  Orthopedic:  Only mild pain with patient about the right Lisfranc joint complex   Radiographs: No x-rays taken today Assessment:   1. Closed displaced fracture of second metatarsal bone of right foot with routine healing, subsequent encounter   2. Lisfranc dislocation, right, initial encounter   3. Capsulitis of foot, left    Plan:  Patient was evaluated and treated and all questions answered.  Lisfranc fracture dislocation right foot, diabetes with history of PAD status post contralateral leg revascularization, history of tobacco use disorder -Cast removed x-rays taken appears to have some healing no crossing across the Lisfranc joint yet.  No pain on exam.  Short leg cast reapplied with 3 rolls of fiberglass padding.  Follow-up in 2 weeks for new cast application  Left foot pain capsulitis -X-rays taken reviewed no evidence of fracture dislocation  Return in about 2 weeks (around 06/02/2019).

## 2019-06-09 ENCOUNTER — Ambulatory Visit (INDEPENDENT_AMBULATORY_CARE_PROVIDER_SITE_OTHER): Payer: Medicare Other

## 2019-06-09 ENCOUNTER — Other Ambulatory Visit: Payer: Self-pay

## 2019-06-09 ENCOUNTER — Ambulatory Visit: Payer: Medicare Other | Admitting: Podiatry

## 2019-06-09 DIAGNOSIS — R0902 Hypoxemia: Secondary | ICD-10-CM | POA: Diagnosis not present

## 2019-06-09 DIAGNOSIS — M778 Other enthesopathies, not elsewhere classified: Secondary | ICD-10-CM

## 2019-06-09 DIAGNOSIS — S93324A Dislocation of tarsometatarsal joint of right foot, initial encounter: Secondary | ICD-10-CM

## 2019-06-09 DIAGNOSIS — S92321D Displaced fracture of second metatarsal bone, right foot, subsequent encounter for fracture with routine healing: Secondary | ICD-10-CM

## 2019-06-09 DIAGNOSIS — M779 Enthesopathy, unspecified: Secondary | ICD-10-CM | POA: Diagnosis not present

## 2019-06-12 DIAGNOSIS — N486 Induration penis plastica: Secondary | ICD-10-CM

## 2019-06-12 DIAGNOSIS — N521 Erectile dysfunction due to diseases classified elsewhere: Secondary | ICD-10-CM

## 2019-06-12 DIAGNOSIS — N401 Enlarged prostate with lower urinary tract symptoms: Secondary | ICD-10-CM | POA: Diagnosis not present

## 2019-06-12 DIAGNOSIS — F17200 Nicotine dependence, unspecified, uncomplicated: Secondary | ICD-10-CM

## 2019-06-12 DIAGNOSIS — T83490A Other mechanical complication of penile (implanted) prosthesis, initial encounter: Secondary | ICD-10-CM | POA: Diagnosis not present

## 2019-06-12 HISTORY — DX: Erectile dysfunction due to diseases classified elsewhere: N52.1

## 2019-06-12 HISTORY — DX: Induration penis plastica: N48.6

## 2019-06-12 HISTORY — DX: Nicotine dependence, unspecified, uncomplicated: F17.200

## 2019-06-15 NOTE — Progress Notes (Signed)
Subjective:  Patient ID: Don [name], male    DOB: January 03, 1951,  MRN: [id number]  No chief complaint on file.   68 y.o. male presents with the above complaint.  States that his wife passed a cast because it was starting to irritate a little bit but did not cause any skin breakdown.  Review of Systems: Negative except as noted in the HPI. Denies N/V/F/Ch.  Past Medical History:  Diagnosis Date  . Arthritis   . Basal cell carcinoma   . Cancer ([place])    throat - 1997, throat - 2018  . COPD (chronic obstructive pulmonary disease) ([place])   . Coronary artery disease   . GERD (gastroesophageal reflux disease)   . Hyperlipidemia   . Hypertension   . Lesion of vocal cord   . Melanoma of back ([place])    melanoma on back  . Myocardial infarction ([place]) 2000   2 stents  . Peripheral vascular disease ([facility])    iliac artery clot  . Pneumonia 02/2015  . Wears glasses     Current Outpatient Medications:  .  ALPRAZolam (XANAX) 0.25 MG tablet, Take 0.25 mg by mouth 2 (two) times daily as needed for anxiety. , Disp: , Rfl:  .  amLODipine (NORVASC) 5 MG tablet, Take 1 tablet (5 mg total) by mouth daily., Disp: 30 tablet, Rfl: 11 .  aspirin 81 MG tablet, Take 162 mg by mouth daily. , Disp: , Rfl:  .  BREO ELLIPTA 100-25 MCG/INH AEPB, INHALE 1 PUFF BY MOUTH ONCE DAILY, Disp: , Rfl:  .  clopidogrel (PLAVIX) 75 MG tablet, TAKE 1 TABLET BY MOUTH ONCE DAILY, Disp: 30 tablet, Rfl: 10 .  cyclobenzaprine (FLEXERIL) 10 MG tablet, Take 10 mg by mouth 2 (two) times daily as needed for muscle spasms. , Disp: , Rfl:  .  DULoxetine (CYMBALTA) 60 MG capsule, Take 60 mg by mouth 2 (two) times daily. , Disp: , Rfl:  .  gabapentin (NEURONTIN) 300 MG capsule, 2 (two) times daily. , Disp: , Rfl: 2 .  ipratropium-albuterol (DUONEB) 0.5-2.5 (3) MG/3ML SOLN, Take 3 mLs by nebulization every 6 (six) hours as needed (for shortness of breath/wheezing. (with bronchitis))., Disp: , Rfl:  .  Lancets (ONETOUCH DELICA PLUS  [id number]) MISC, USE 1 UNIT TO CHECK GLUCOSE THREE TIMES DAILY, Disp: , Rfl:  .  metFORMIN (GLUCOPHAGE) 500 MG tablet, daily with breakfast. , Disp: , Rfl: 0 .  metoprolol (LOPRESSOR) 50 MG tablet, Take 50 mg by mouth 2 (two) times daily. , Disp: , Rfl:  .  omeprazole (PRILOSEC) 20 MG capsule, Take 20 mg by mouth daily., Disp: , Rfl:  .  ONETOUCH ULTRA test strip, USE 1 STRIP TO CHECK GLUCOSE TWICE DAILY, Disp: , Rfl:  .  oxyCODONE-acetaminophen (PERCOCET) 10-325 MG tablet, Take 1 tablet by mouth every 4 (four) hours as needed for pain., Disp: 12 tablet, Rfl: 0 .  rosuvastatin (CRESTOR) 20 MG tablet, daily. , Disp: , Rfl: 0 .  SPIRIVA RESPIMAT 2.5 MCG/ACT AERS, , Disp: , Rfl:  .  tamsulosin (FLOMAX) 0.4 MG CAPS capsule, TAKE 1 CAPSULE BY MOUTH ONCE DAILY AT BEDTIME, Disp: , Rfl:  .  TOUJEO SOLOSTAR 300 UNIT/ML SOPN, 30 Units daily. , Disp: , Rfl: 0 .  traZODone (DESYREL) 150 MG tablet, Take 150 mg by mouth at bedtime., Disp: , Rfl:  .  VENTOLIN HFA 108 (90 Base) MCG/ACT inhaler, prn, Disp: , Rfl: 0  Social History   Tobacco Use  Smoking Status Current  Some Day Smoker  . Packs/day: 1.00  . Years: 50.00  . Pack years: 50.00  . Types: Cigarettes  Smokeless Tobacco Never Used    Allergies  Allergen Reactions  . Prednisone     Depressed mood - can tolerate it, but it makes him very irritable  . Doxycycline Rash  . Iodinated Diagnostic Agents Rash    Also developed blisters Also developed blisters  . Penicillins Rash    Has patient had a PCN reaction causing immediate rash, facial/tongue/throat swelling, SOB or lightheadedness with hypotension:unsure Has patient had a PCN reaction causing severe rash involving mucus membranes or skin necrosis:unsure Has patient had a PCN reaction that required hospitalization:No Has patient had a PCN reaction occurring within the last 10 years:No If all of the above answers are "NO", then may proceed with Cephalosporin use.    Objective:   There  were no vitals filed for this visit. Height:  5'9 Weight: 190 There is no height or weight on file to calculate BMI. Constitutional Well developed. Well nourished.  Vascular Dorsalis pedis pulses palpable bilaterally. Posterior tibial pulses palpable bilaterally. Capillary refill normal to all digits.  No cyanosis or clubbing noted. Pedal hair growth normal.  Neurologic Normal speech. Oriented to person, place, and time. Epicritic sensation to light touch grossly present bilaterally.  Dermatologic Nails well groomed and normal in appearance. No skin breakdown right foot  Orthopedic: No pain with palpation about the right Lisfranc joint complex   Radiographs: Taken and reviewed no definite bridging noted Assessment:   1. Closed displaced fracture of second metatarsal bone of right foot with routine healing, subsequent encounter   2. Lisfranc dislocation, right, initial encounter   3. Capsulitis of foot, left    Plan:  Patient was evaluated and treated and all questions answered.  Lisfranc fracture dislocation right foot, diabetes with history of PAD status post contralateral leg revascularization, history of tobacco use disorder -Cast removed.  No wound or skin breakdown.  Cast reapplied with 3 layers of fiberglass cast material  Return in about 2 weeks (around 06/23/2019).

## 2019-06-16 ENCOUNTER — Other Ambulatory Visit: Payer: Self-pay | Admitting: Internal Medicine

## 2019-06-20 ENCOUNTER — Telehealth: Payer: Self-pay | Admitting: Podiatry

## 2019-06-20 NOTE — Telephone Encounter (Signed)
Patient's wife called concerned his second toe is blue, unsure if he hit it but concerned because he has a cast on. Advised patient's wife to come to the office for eval and possible removal of cast.

## 2019-06-21 ENCOUNTER — Ambulatory Visit (INDEPENDENT_AMBULATORY_CARE_PROVIDER_SITE_OTHER): Payer: Medicare Other | Admitting: Podiatry

## 2019-06-21 DIAGNOSIS — S90121A Contusion of right lesser toe(s) without damage to nail, initial encounter: Secondary | ICD-10-CM

## 2019-06-23 ENCOUNTER — Ambulatory Visit (INDEPENDENT_AMBULATORY_CARE_PROVIDER_SITE_OTHER): Payer: Medicare Other

## 2019-06-23 ENCOUNTER — Ambulatory Visit (INDEPENDENT_AMBULATORY_CARE_PROVIDER_SITE_OTHER): Payer: Medicare Other | Admitting: Podiatry

## 2019-06-23 ENCOUNTER — Other Ambulatory Visit: Payer: Self-pay | Admitting: Podiatry

## 2019-06-23 ENCOUNTER — Other Ambulatory Visit: Payer: Self-pay

## 2019-06-23 DIAGNOSIS — S92321D Displaced fracture of second metatarsal bone, right foot, subsequent encounter for fracture with routine healing: Secondary | ICD-10-CM

## 2019-06-23 DIAGNOSIS — M79671 Pain in right foot: Secondary | ICD-10-CM

## 2019-06-23 NOTE — Progress Notes (Signed)
Subjective:  Patient ID: Don [name], male    DOB: 10-27-1951,  MRN: [id number]  Chief Complaint  Patient presents with  . Cast Removal    Pt. followed up for a cast change and a new cast application Pt. states his toes and foot feels much better."     68 y.o. male presents with the above complaint. Hx as above.   Review of Systems: Negative except as noted in the HPI. Denies N/V/F/Ch.  Past Medical History:  Diagnosis Date  . Arthritis   . Basal cell carcinoma   . Cancer ([place])    throat - 1997, throat - 2018  . COPD (chronic obstructive pulmonary disease) ([place])   . Coronary artery disease   . GERD (gastroesophageal reflux disease)   . Hyperlipidemia   . Hypertension   . Lesion of vocal cord   . Melanoma of back ([place])    melanoma on back  . Myocardial infarction ([place]) 2000   2 stents  . Peripheral vascular disease ([facility])    iliac artery clot  . Pneumonia 02/2015  . Wears glasses     Current Outpatient Medications:  .  ALPRAZolam (XANAX) 0.25 MG tablet, Take 0.25 mg by mouth 2 (two) times daily as needed for anxiety. , Disp: , Rfl:  .  amLODipine (NORVASC) 5 MG tablet, Take 1 tablet (5 mg total) by mouth daily., Disp: 30 tablet, Rfl: 11 .  aspirin 81 MG tablet, Take 162 mg by mouth daily. , Disp: , Rfl:  .  BREO ELLIPTA 100-25 MCG/INH AEPB, INHALE 1 PUFF BY MOUTH ONCE DAILY, Disp: , Rfl:  .  clopidogrel (PLAVIX) 75 MG tablet, TAKE 1 TABLET BY MOUTH ONCE DAILY, Disp: 30 tablet, Rfl: 10 .  cyclobenzaprine (FLEXERIL) 10 MG tablet, Take 10 mg by mouth 2 (two) times daily as needed for muscle spasms. , Disp: , Rfl:  .  DULoxetine (CYMBALTA) 60 MG capsule, Take 60 mg by mouth 2 (two) times daily. , Disp: , Rfl:  .  gabapentin (NEURONTIN) 300 MG capsule, 2 (two) times daily. , Disp: , Rfl: 2 .  ipratropium-albuterol (DUONEB) 0.5-2.5 (3) MG/3ML SOLN, Take 3 mLs by nebulization every 6 (six) hours as needed (for shortness of breath/wheezing. (with bronchitis))., Disp: , Rfl:   .  Lancets (ONETOUCH DELICA PLUS [id number]) MISC, USE 1 UNIT TO CHECK GLUCOSE THREE TIMES DAILY, Disp: , Rfl:  .  metFORMIN (GLUCOPHAGE) 500 MG tablet, daily with breakfast. , Disp: , Rfl: 0 .  metoprolol (LOPRESSOR) 50 MG tablet, Take 50 mg by mouth 2 (two) times daily. , Disp: , Rfl:  .  omeprazole (PRILOSEC) 20 MG capsule, Take 20 mg by mouth daily., Disp: , Rfl:  .  ONETOUCH ULTRA test strip, USE 1 STRIP TO CHECK GLUCOSE TWICE DAILY, Disp: , Rfl:  .  oxyCODONE-acetaminophen (PERCOCET) 10-325 MG tablet, Take 1 tablet by mouth every 4 (four) hours as needed for pain., Disp: 12 tablet, Rfl: 0 .  rosuvastatin (CRESTOR) 20 MG tablet, daily. , Disp: , Rfl: 0 .  SPIRIVA RESPIMAT 2.5 MCG/ACT AERS, , Disp: , Rfl:  .  tamsulosin (FLOMAX) 0.4 MG CAPS capsule, TAKE 1 CAPSULE BY MOUTH ONCE DAILY AT BEDTIME, Disp: , Rfl:  .  TOUJEO SOLOSTAR 300 UNIT/ML SOPN, 30 Units daily. , Disp: , Rfl: 0 .  traZODone (DESYREL) 150 MG tablet, Take 150 mg by mouth at bedtime., Disp: , Rfl:  .  VENTOLIN HFA 108 (90 Base) MCG/ACT inhaler, prn, Disp: , Rfl: 0  Social History   Tobacco Use  Smoking Status Current Some Day Smoker  . Packs/day: 1.00  . Years: 50.00  . Pack years: 50.00  . Types: Cigarettes  Smokeless Tobacco Never Used    Allergies  Allergen Reactions  . Prednisone     Depressed mood - can tolerate it, but it makes him very irritable  . Doxycycline Rash  . Iodinated Diagnostic Agents Rash    Also developed blisters Also developed blisters  . Penicillins Rash    Has patient had a PCN reaction causing immediate rash, facial/tongue/throat swelling, SOB or lightheadedness with hypotension:unsure Has patient had a PCN reaction causing severe rash involving mucus membranes or skin necrosis:unsure Has patient had a PCN reaction that required hospitalization:No Has patient had a PCN reaction occurring within the last 10 years:No If all of the above answers are "NO", then may proceed with Cephalosporin  use.    Objective:   There were no vitals filed for this visit. Height:  5'9 Weight: 190 There is no height or weight on file to calculate BMI. Constitutional Well developed. Well nourished.  Vascular Dorsalis pedis pulses palpable bilaterally. Posterior tibial pulses palpable bilaterally. Capillary refill normal to all digits.  No cyanosis or clubbing noted. Pedal hair growth normal.  Neurologic Normal speech. Oriented to person, place, and time. Epicritic sensation to light touch grossly present bilaterally.  Dermatologic Nails well groomed and normal in appearance. No skin breakdown right foot Slight bluish discoloration right 2nd toe  Orthopedic: No pain to palpation about the right Lisfranc joint complex   Radiographs: Taken and reviewed no definite bridging noted Assessment:   1. Closed displaced fracture of second metatarsal bone of right foot with routine healing, subsequent encounter    Plan:  Patient was evaluated and treated and all questions answered.  Lisfranc fracture dislocation right foot, diabetes with history of PAD status post contralateral leg revascularization, history of tobacco use disorder -XR taken and reviewed. The fracture does appear to have some signs of healing across the Lisfranc complex. He has no pain on exam. The joint appears stable. Will continue immobilization in cast for 2 more weeks. -No further toe discoloration noted today -Short leg cast reapplied with 3 rolls of fiberglass  No follow-ups on file.

## 2019-06-26 ENCOUNTER — Other Ambulatory Visit: Payer: Self-pay | Admitting: Podiatry

## 2019-06-26 DIAGNOSIS — S93326A Dislocation of tarsometatarsal joint of unspecified foot, initial encounter: Secondary | ICD-10-CM | POA: Diagnosis not present

## 2019-06-26 DIAGNOSIS — M79671 Pain in right foot: Secondary | ICD-10-CM

## 2019-06-26 DIAGNOSIS — S92321D Displaced fracture of second metatarsal bone, right foot, subsequent encounter for fracture with routine healing: Secondary | ICD-10-CM

## 2019-06-26 DIAGNOSIS — J449 Chronic obstructive pulmonary disease, unspecified: Secondary | ICD-10-CM | POA: Diagnosis not present

## 2019-06-27 ENCOUNTER — Other Ambulatory Visit: Payer: Self-pay

## 2019-06-27 DIAGNOSIS — R292 Abnormal reflex: Secondary | ICD-10-CM

## 2019-06-27 DIAGNOSIS — R531 Weakness: Secondary | ICD-10-CM

## 2019-06-27 DIAGNOSIS — R2681 Unsteadiness on feet: Secondary | ICD-10-CM

## 2019-06-27 DIAGNOSIS — R296 Repeated falls: Secondary | ICD-10-CM

## 2019-07-01 ENCOUNTER — Encounter: Payer: Medicare Other | Admitting: Neurology

## 2019-07-07 ENCOUNTER — Ambulatory Visit (INDEPENDENT_AMBULATORY_CARE_PROVIDER_SITE_OTHER): Payer: Medicare Other

## 2019-07-07 ENCOUNTER — Ambulatory Visit: Payer: Medicare Other | Admitting: Podiatry

## 2019-07-07 ENCOUNTER — Other Ambulatory Visit: Payer: Self-pay

## 2019-07-07 ENCOUNTER — Other Ambulatory Visit: Payer: Self-pay | Admitting: Podiatry

## 2019-07-07 DIAGNOSIS — S92321D Displaced fracture of second metatarsal bone, right foot, subsequent encounter for fracture with routine healing: Secondary | ICD-10-CM | POA: Diagnosis not present

## 2019-07-10 DIAGNOSIS — R0902 Hypoxemia: Secondary | ICD-10-CM | POA: Diagnosis not present

## 2019-07-25 DIAGNOSIS — N183 Chronic kidney disease, stage 3 unspecified: Secondary | ICD-10-CM | POA: Insufficient documentation

## 2019-07-25 DIAGNOSIS — L299 Pruritus, unspecified: Secondary | ICD-10-CM | POA: Diagnosis not present

## 2019-07-25 DIAGNOSIS — N486 Induration penis plastica: Secondary | ICD-10-CM | POA: Diagnosis not present

## 2019-07-25 DIAGNOSIS — N529 Male erectile dysfunction, unspecified: Secondary | ICD-10-CM | POA: Diagnosis not present

## 2019-07-25 DIAGNOSIS — F17210 Nicotine dependence, cigarettes, uncomplicated: Secondary | ICD-10-CM | POA: Diagnosis not present

## 2019-07-25 DIAGNOSIS — T83490D Other mechanical complication of penile (implanted) prosthesis, subsequent encounter: Secondary | ICD-10-CM | POA: Diagnosis not present

## 2019-07-25 DIAGNOSIS — D485 Neoplasm of uncertain behavior of skin: Secondary | ICD-10-CM | POA: Diagnosis not present

## 2019-07-25 HISTORY — DX: Chronic kidney disease, stage 3 unspecified: N18.30

## 2019-07-27 DIAGNOSIS — S93326A Dislocation of tarsometatarsal joint of unspecified foot, initial encounter: Secondary | ICD-10-CM | POA: Diagnosis not present

## 2019-07-27 DIAGNOSIS — J449 Chronic obstructive pulmonary disease, unspecified: Secondary | ICD-10-CM | POA: Diagnosis not present

## 2019-07-29 ENCOUNTER — Ambulatory Visit (INDEPENDENT_AMBULATORY_CARE_PROVIDER_SITE_OTHER): Payer: Medicare Other | Admitting: Podiatry

## 2019-07-29 DIAGNOSIS — Z5329 Procedure and treatment not carried out because of patient's decision for other reasons: Secondary | ICD-10-CM

## 2019-08-04 ENCOUNTER — Other Ambulatory Visit: Payer: Self-pay | Admitting: Internal Medicine

## 2019-08-05 NOTE — Progress Notes (Signed)
Patient had to cancel, his wife was sick and she is his transportation.

## 2019-08-10 DIAGNOSIS — R0902 Hypoxemia: Secondary | ICD-10-CM | POA: Diagnosis not present

## 2019-08-10 NOTE — Progress Notes (Signed)
Patient was seen for emergency check due to complaint of the toe turning blue.  The toe appears warm and viable without signs of infection.  Likely contusion.  3 views x-rays taken showing possible fracture of the phalanx.  Continue cast immobilization will change cast on Monday

## 2019-08-19 DIAGNOSIS — I519 Heart disease, unspecified: Secondary | ICD-10-CM | POA: Diagnosis not present

## 2019-08-19 DIAGNOSIS — Z01810 Encounter for preprocedural cardiovascular examination: Secondary | ICD-10-CM | POA: Diagnosis not present

## 2019-08-19 DIAGNOSIS — E782 Mixed hyperlipidemia: Secondary | ICD-10-CM | POA: Diagnosis not present

## 2019-08-19 DIAGNOSIS — I2510 Atherosclerotic heart disease of native coronary artery without angina pectoris: Secondary | ICD-10-CM | POA: Diagnosis not present

## 2019-08-20 ENCOUNTER — Other Ambulatory Visit: Payer: Self-pay | Admitting: Internal Medicine

## 2019-08-25 DIAGNOSIS — Z01810 Encounter for preprocedural cardiovascular examination: Secondary | ICD-10-CM | POA: Diagnosis not present

## 2019-08-25 DIAGNOSIS — Z23 Encounter for immunization: Secondary | ICD-10-CM | POA: Diagnosis not present

## 2019-08-25 DIAGNOSIS — D692 Other nonthrombocytopenic purpura: Secondary | ICD-10-CM | POA: Diagnosis not present

## 2019-08-25 DIAGNOSIS — I10 Essential (primary) hypertension: Secondary | ICD-10-CM | POA: Diagnosis not present

## 2019-08-25 DIAGNOSIS — E1142 Type 2 diabetes mellitus with diabetic polyneuropathy: Secondary | ICD-10-CM | POA: Diagnosis not present

## 2019-08-25 DIAGNOSIS — E782 Mixed hyperlipidemia: Secondary | ICD-10-CM | POA: Diagnosis not present

## 2019-08-26 DIAGNOSIS — S93326A Dislocation of tarsometatarsal joint of unspecified foot, initial encounter: Secondary | ICD-10-CM | POA: Diagnosis not present

## 2019-08-26 DIAGNOSIS — J449 Chronic obstructive pulmonary disease, unspecified: Secondary | ICD-10-CM | POA: Diagnosis not present

## 2019-08-26 DIAGNOSIS — I519 Heart disease, unspecified: Secondary | ICD-10-CM | POA: Diagnosis not present

## 2019-08-26 DIAGNOSIS — I2510 Atherosclerotic heart disease of native coronary artery without angina pectoris: Secondary | ICD-10-CM | POA: Diagnosis not present

## 2019-08-26 DIAGNOSIS — Z01810 Encounter for preprocedural cardiovascular examination: Secondary | ICD-10-CM | POA: Diagnosis not present

## 2019-08-29 DIAGNOSIS — J441 Chronic obstructive pulmonary disease with (acute) exacerbation: Secondary | ICD-10-CM | POA: Diagnosis not present

## 2019-09-09 DIAGNOSIS — R0902 Hypoxemia: Secondary | ICD-10-CM | POA: Diagnosis not present

## 2019-09-10 DIAGNOSIS — J441 Chronic obstructive pulmonary disease with (acute) exacerbation: Secondary | ICD-10-CM | POA: Diagnosis not present

## 2019-09-11 ENCOUNTER — Other Ambulatory Visit: Payer: Self-pay | Admitting: Internal Medicine

## 2019-09-11 DIAGNOSIS — J441 Chronic obstructive pulmonary disease with (acute) exacerbation: Secondary | ICD-10-CM | POA: Diagnosis not present

## 2019-09-16 ENCOUNTER — Other Ambulatory Visit: Payer: Self-pay

## 2019-09-16 ENCOUNTER — Ambulatory Visit (INDEPENDENT_AMBULATORY_CARE_PROVIDER_SITE_OTHER): Payer: Medicare Other | Admitting: Primary Care

## 2019-09-16 ENCOUNTER — Encounter: Payer: Self-pay | Admitting: Primary Care

## 2019-09-16 ENCOUNTER — Telehealth: Payer: Self-pay | Admitting: Internal Medicine

## 2019-09-16 ENCOUNTER — Telehealth: Payer: Self-pay | Admitting: Primary Care

## 2019-09-16 DIAGNOSIS — J441 Chronic obstructive pulmonary disease with (acute) exacerbation: Secondary | ICD-10-CM

## 2019-09-16 MED ORDER — GUAIFENESIN ER 1200 MG PO TB12: 1.0000 | ORAL_TABLET | Freq: Two times a day (BID) | ORAL | 0 refills | Status: DC

## 2019-09-16 MED ORDER — HYDROCOD POLST-CPM POLST ER 10-8 MG/5ML PO SUER: 5.0000 mL | Freq: Two times a day (BID) | ORAL | 0 refills | Status: DC

## 2019-09-16 MED ORDER — PROMETHAZINE-CODEINE 6.25-10 MG/5ML PO SYRP: 5.0000 mL | ORAL_SOLUTION | Freq: Four times a day (QID) | ORAL | 0 refills | Status: DC | PRN

## 2019-09-16 MED ORDER — PREDNISONE 10 MG PO TABS: ORAL_TABLET | ORAL | 0 refills | Status: DC

## 2019-09-16 NOTE — Addendum Note (Signed)
Addended by: Martyn Ehrich on: 09/16/2019 05:11 PM   Modules accepted: Orders

## 2019-09-16 NOTE — Telephone Encounter (Signed)
Called and spoke to pt's wife, Anne Ng. Pt c/o chest congestion, cough, SOB, fatigue x 3 weeks. Pt was prescribed levaquin and pred taper by PCP. Pt felt some better but not back to baseline. Pt was then prescribed a zpak and finished the last dose today. Pt states he has O2 but only uses it for flare ups. Pts spo2 was 89-90% on RA at rest. Pt denies CP/tightness and f/c/s. Pt last seen in 07/2018. Mychart visit offered but was refused due to the inability to log into mychart. Telephone visit scheduled with Derl Barrow, NP this afternoon. Will sign off.   Will forward to [facility] as FYI.

## 2019-09-16 NOTE — Patient Instructions (Signed)
COPD exacerbation - Recommend covid testing ([facility]) - Prednisone taper as prescribed - Mucinex 1,200mg  twice daily (will full glass of water) - Check sputum sample - Tussionex cough syrup 25ml twice a day for cough (do not combine with other sedating/opioid medications)  Follow-up - 7-10 day televisit with [facility] NP

## 2019-09-16 NOTE — Progress Notes (Signed)
Virtual Visit via Telephone Note  I connected with Don [name] on 09/16/19 at  3:30 PM EST by telephone and verified that I am speaking with the correct person using two identifiers.  Location: Patient: Home Provider: Office   I discussed the limitations, risks, security and privacy concerns of performing an evaluation and management service by telephone and the availability of in person appointments. I also discussed with the patient that there may be a patient responsible charge related to this service. The patient expressed understanding and agreed to proceed.   History of Present Illness: 68 year old male, current smoker. PMH significant for COPD, squamous cell carcinoma of larynx, lung nodules, CAD, hypertension, dysphagia. Patient of Dr. Chase Caller, last seen 08/12/18. CT chest in 2019 showed chronic stable pulmonary nodules. Breo/Spiriva changed to Trelegy. Encouraged smoking cessation. Referred to ENT for dysphagia. Recommended 6 week follow-up.    09/16/2019 Patient contacted today for televisit and accompanied by his wife. Last seen 1 year ago. Reports shortness of breath and prod cough x 3 weeks. He has been treated with prednisone taper, Azithromycin and Levaquin per PCP. Wife states that he has improved some but still has significant cough. Reports that he chokes on his mucus and has frequent coughing fits. He has been taking mucinex for the last several weeks. He remains on Breo and Spiriva as prescribed, Trelegy was not affordable. Wife states that his PCP ordered CXR which he was unable to do because he needs to be COVID tested prior.   Observations/Objective:  - No observed shortness of breath, wheezing or cough during phone conversation  Assessment and Plan:  COPD exacerbation - Prolonged bronchitis symptoms despite completing prednisone, Zpack and Levaquin - Continue Breo 1 puff daily and Spiriva  - Needs additional prednisone taper - Advised Mucinex 1,200mg  twice  daily  - Tussionex cough syrup 70ml twice a day for cough (do not combine with other sedating/opioid medications) - Check sputum sample (avoiding further antibiotics until culture and CXR) - Recommend covid testing  Follow Up Instructions:   - 1 week televisit with Beth NP  I discussed the assessment and treatment plan with the patient. The patient was provided an opportunity to ask questions and all were answered. The patient agreed with the plan and demonstrated an understanding of the instructions.   The patient was advised to call back or seek an in-person evaluation if the symptoms worsen or if the condition fails to improve as anticipated.  I provided 22 minutes of non-face-to-face time during this encounter.   Martyn Ehrich, NP

## 2019-09-16 NOTE — Telephone Encounter (Signed)
Spoke with pharmacist Tammy, states that they do not have Tussionex as prescribed by [facility] to dispense to pt.   Spoke to [facility], who asked if they had promethazine with codeine to substitute.  Tammy verified that they do have this in stock.  Eustaqu[facility] Maize is sending in a new rx of promethazine with codeine cough syrup 14mL q6h prn X7 days, and the Tussionex rx has been cancelled with pharmacy.  Nothing further needed at this time- will close encounter.

## 2019-09-18 ENCOUNTER — Other Ambulatory Visit: Payer: Self-pay

## 2019-09-18 ENCOUNTER — Telehealth: Payer: Self-pay | Admitting: Neurology

## 2019-09-18 ENCOUNTER — Ambulatory Visit (INDEPENDENT_AMBULATORY_CARE_PROVIDER_SITE_OTHER): Payer: Medicare Other | Admitting: Neurology

## 2019-09-18 DIAGNOSIS — R296 Repeated falls: Secondary | ICD-10-CM

## 2019-09-18 DIAGNOSIS — R531 Weakness: Secondary | ICD-10-CM

## 2019-09-18 DIAGNOSIS — R292 Abnormal reflex: Secondary | ICD-10-CM

## 2019-09-18 DIAGNOSIS — R2681 Unsteadiness on feet: Secondary | ICD-10-CM

## 2019-09-18 NOTE — Procedures (Signed)
[facility]  [place], [place]  [place], [place] [address] Tel: [phone] Fax:  [phone] Test Date:  09/18/2019  Patient: Don [name] DOB: 1951/02/22 Physician: Narda Amber, DO  Sex: Male Height: 5\' 9"  Ref Phys: Metta Clines, DO  ID#: [id number] Temp: 32.0C Technician:    Patient Complaints: This is a 68 year old man referred for evaluation of unsteady gait.  NCV & EMG Findings: Extensive electrodiagnostic testing of the right lower extremity and additional studies of the left shows:  1. Bilateral sural and superficial peroneal sensory responses are within normal limits. 2. Bilateral peroneal and tibial motor responses are within normal limits. 3. Bilateral tibial H reflex studies are symmetric and mildly prolonged.  These findings, in isolation, are of unclear clinical significance. 4. There is no evidence of active or chronic motor axonal loss changes affecting any of the tested muscles.  Motor unit configuration and recruitment pattern is within normal limits.  Impression: This is a normal study of the lower extremities.  In particular, there is no evidence of a large fiber sensorimotor polyneuropathy or lumbosacral radiculopathy.   ___________________________ Narda Amber, DO    Nerve Conduction Studies Anti Sensory Summary Table   Site NR Peak (ms) Norm Peak (ms) P-T Amp (V) Norm P-T Amp  Left Sup Peroneal Anti Sensory (Ant Lat Mall)  32C  12 cm    2.7 <4.6 6.3 >3  Right Sup Peroneal Anti Sensory (Ant Lat Mall)  32C  12 cm    2.9 <4.6 3.6 >3  Left Sural Anti Sensory (Lat Mall)  32C  Calf    3.8 <4.6 6.7 >3  Right Sural Anti Sensory (Lat Mall)  32C  Calf    4.2 <4.6 4.0 >3   Motor Summary Table   Site NR Onset (ms) Norm Onset (ms) O-P Amp (mV) Norm O-P Amp Site1 Site2 Delta-0 (ms) Dist (cm) Vel (m/s) Norm Vel (m/s)  Left Peroneal Motor (Ext Dig Brev)  32C  Ankle    5.2 <6.0 2.7 >2.5 B Fib Ankle 8.4 36.0 43 >40  B Fib    13.6  2.4   Poplt B Fib 1.7 8.0 47 >40  Poplt    15.3  2.3         Right Peroneal Motor (Ext Dig Brev)  32C  Ankle    4.3 <6.0 2.6 >2.5 B Fib Ankle 9.4 38.0 40 >40  B Fib    13.7  2.4  Poplt B Fib 2.3 10.0 43 >40  Poplt    16.0  2.2         Left Peroneal TA Motor (Tib Ant)  32C  Fib Head    3.0 <4.5 4.6 >3 Poplit Fib Head 1.8 8.0 44 >40  Poplit    4.8  4.3         Right Peroneal TA Motor (Tib Ant)  32C  Fib Head    2.9 <4.5 4.4 >3 Poplit Fib Head 1.4 9.0 64 >40  Poplit    4.3  4.2         Left Tibial Motor (Abd Hall Brev)  32C  Ankle    5.6 <6.0 6.6 >4 Knee Ankle 10.4 44.0 42 >40  Knee    16.0  5.4         Right Tibial Motor (Abd Hall Brev)  32C  Ankle    4.0 <6.0 5.2 >4 Knee Ankle 10.1 42.0 42 >40  Knee    14.1  2.5  H Reflex Studies   NR H-Lat (ms) Lat Norm (ms) L-R H-Lat (ms)  Left Tibial (Gastroc)  32C     37.82 <35 0.00  Right Tibial (Gastroc)  32C     37.82 <35 0.00   EMG   Side Muscle Ins Act Fibs Psw Fasc Number Recrt Dur Dur. Amp Amp. Poly Poly. Comment  Right AntTibialis Nml Nml Nml Nml Nml Nml Nml Nml Nml Nml Nml Nml N/A  Right Gastroc Nml Nml Nml Nml Nml Nml Nml Nml Nml Nml Nml Nml N/A  Right Flex Dig Long Nml Nml Nml Nml Nml Nml Nml Nml Nml Nml Nml Nml N/A  Right RectFemoris Nml Nml Nml Nml Nml Nml Nml Nml Nml Nml Nml Nml N/A  Right GluteusMed Nml Nml Nml Nml Nml Nml Nml Nml Nml Nml Nml Nml N/A  Left AntTibialis Nml Nml Nml Nml Nml Nml Nml Nml Nml Nml Nml Nml N/A  Left Gastroc Nml Nml Nml Nml Nml Nml Nml Nml Nml Nml Nml Nml N/A  Left Flex Dig Long Nml Nml Nml Nml Nml Nml Nml Nml Nml Nml Nml Nml N/A  Left RectFemoris Nml Nml Nml Nml Nml Nml Nml Nml Nml Nml Nml Nml N/A  Left GluteusMed Nml Nml Nml Nml Nml Nml Nml Nml Nml Nml Nml Nml N/A      Waveforms:

## 2019-09-18 NOTE — Telephone Encounter (Signed)
Please be aware see below Patient here now with Dr. Posey Pronto for EMG

## 2019-09-18 NOTE — Telephone Encounter (Signed)
Patient's wife brought the patient in to see Dr. Posey Pronto for an EMG today. She said the patient has fallen 3-4 times this week and she wants Dr. Tomi Likens to be aware of this. She said she has a video to show the doctor at the next visit on 09/24/19 with Dr. Tomi Likens at 2:30 PM.

## 2019-09-19 ENCOUNTER — Telehealth: Payer: Self-pay

## 2019-09-19 NOTE — Telephone Encounter (Signed)
-----   Message from Pieter Partridge, DO sent at 09/18/2019  3:05 PM EST ----- Nerve study is normal

## 2019-09-19 NOTE — Telephone Encounter (Signed)
Called spoke with patient he was informed of results. 

## 2019-09-23 NOTE — Progress Notes (Signed)
NEUROLOGY FOLLOW UP OFFICE NOTE  DRISTON ADERHOLT [id number]  HISTORY OF PRESENT ILLNESS: Don [name] is a 68 year old Caucasian man with hypertension, CAD, COPD, peripheral vascular disease, arthritis and history of throat cancer, melanoma and throat cancer who follows up for cognitive impairment and unsteady gait.  History supplemented by referring provider note.  He is accompanied by his wife who supplements history.  UPDATE: MRI Cervical spine WO from 05/31/2019 personally reviewed and demonstrated previous ACDF at C3-4 as well as multilevel spondylosis with left-sided predominant osteophytes and disc herniation causing mild deformity of the left side of the cord.  NCV-EMG on lower extremities on 09/18/2019 was normal.  He also has trouble swallowing food.  He has history of throat cancer and had esophageal dilation.    On the way over here, he started having a coughing fit and he lost consciousness and had brief episode of shaking "like a seizure" for 2 to 3 minutes.  No postictal confusion.    HISTORY: Since 2018, he has had trouble with balance.  Since November 2019, it started to gradually get worse.  When he stands and walks, he feels unsteady.  His legs feel weak and will often give out under him.  He has had several falls.  He broke his right foot 3 weeks ago as a result of a fall.  He also has had short term memory issues.  He frequently forgets conversations and repeats questions.  He used to handle the finances but his wife started paying the bills last year due to cognitive difficulties.  Sometimes either arm will shake or jerk uncontrollably.  It is not particularly related to any action or position of his arm.  He denies pain in the legs.  He has occasionally had urinary incontinence.  He has past history of cervical spine surgery but currently denies any neck pain.  He had an MRI of the brain without contrast on 05/05/19 which was personally reviewed and demonstrated global  cerebral atrophy and mild chronic small vessel ischemic changes.  He denies family history of Alzheimer's disease or other neurodegenerative disorders.  PAST MEDICAL HISTORY: Past Medical History:  Diagnosis Date  . Arthritis   . Basal cell carcinoma   . Cancer ([place])    throat - 1997, throat - 2018  . COPD (chronic obstructive pulmonary disease) ([place])   . Coronary artery disease   . GERD (gastroesophageal reflux disease)   . Hyperlipidemia   . Hypertension   . Lesion of vocal cord   . Melanoma of back ([place])    melanoma on back  . Myocardial infarction ([place]) 2000   2 stents  . Peripheral vascular disease (HCC)    iliac artery clot  . Pneumonia 02/2015  . Wears glasses     MEDICATIONS: Current Outpatient Medications on File Prior to Visit  Medication Sig Dispense Refill  . ALPRAZolam (XANAX) 0.25 MG tablet Take 0.25 mg by mouth 2 (two) times daily as needed for anxiety.     Marland Kitchen amLODipine (NORVASC) 5 MG tablet Take 1 tablet by mouth once daily 30 tablet 1  . aspirin 81 MG tablet Take 162 mg by mouth daily.     Marland Kitchen BREO ELLIPTA 100-25 MCG/INH AEPB INHALE 1 PUFF BY MOUTH ONCE DAILY    . clopidogrel (PLAVIX) 75 MG tablet TAKE 1 TABLET BY MOUTH ONCE DAILY 30 tablet 10  . cyclobenzaprine (FLEXERIL) 10 MG tablet Take 10 mg by mouth 2 (two) times daily as needed for  muscle spasms.     . DULoxetine (CYMBALTA) 60 MG capsule Take 60 mg by mouth 2 (two) times daily.     Marland Kitchen gabapentin (NEURONTIN) 300 MG capsule 2 (two) times daily.   2  . Guaifenesin (MUCINEX MAXIMUM STRENGTH) 1200 MG TB12 Take 1 tablet (1,200 mg total) by mouth 2 (two) times daily. 20 tablet 0  . ipratropium-albuterol (DUONEB) 0.5-2.5 (3) MG/3ML SOLN Take 3 mLs by nebulization every 6 (six) hours as needed (for shortness of breath/wheezing. (with bronchitis)).    . Lancets (ONETOUCH DELICA PLUS 123XX123) MISC USE 1 UNIT TO CHECK GLUCOSE THREE TIMES DAILY    . metFORMIN (GLUCOPHAGE) 500 MG tablet daily with breakfast.   0  .  metoprolol (LOPRESSOR) 50 MG tablet Take 50 mg by mouth 2 (two) times daily.     Marland Kitchen omeprazole (PRILOSEC) 20 MG capsule Take 20 mg by mouth daily.    Glory Rosebush ULTRA test strip USE 1 STRIP TO CHECK GLUCOSE TWICE DAILY    . predniSONE (DELTASONE) 10 MG tablet Take 4 tabs po daily x 2 days; then 3 tabs for 2 days; then 2 tabs for 2 days; then 1 tab for 2 days 20 tablet 0  . promethazine-codeine (PHENERGAN WITH CODEINE) 6.25-10 MG/5ML syrup Take 5 mLs by mouth every 6 (six) hours as needed for cough. 140 mL 0  . rosuvastatin (CRESTOR) 20 MG tablet Take 20 mg by mouth daily.   0  . SPIRIVA RESPIMAT 2.5 MCG/ACT AERS Inhale 2 puffs into the lungs daily.     . tamsulosin (FLOMAX) 0.4 MG CAPS capsule TAKE 1 CAPSULE BY MOUTH ONCE DAILY AT BEDTIME    . TOUJEO SOLOSTAR 300 UNIT/ML SOPN 30 Units daily.   0  . traZODone (DESYREL) 150 MG tablet Take 150 mg by mouth at bedtime.    . VENTOLIN HFA 108 (90 Base) MCG/ACT inhaler prn  0   No current facility-administered medications on file prior to visit.     ALLERGIES: Allergies  Allergen Reactions  . Prednisone     Depressed mood - can tolerate it, but it makes him very irritable  . Doxycycline Rash  . Iodinated Diagnostic Agents Rash    Also developed blisters Also developed blisters  . Penicillins Rash    Has patient had a PCN reaction causing immediate rash, facial/tongue/throat swelling, SOB or lightheadedness with hypotension:unsure Has patient had a PCN reaction causing severe rash involving mucus membranes or skin necrosis:unsure Has patient had a PCN reaction that required hospitalization:No Has patient had a PCN reaction occurring within the last 10 years:No If all of the above answers are "NO", then may proceed with Cephalosporin use.     FAMILY HISTORY: Family History  Problem Relation Age of Onset  . Cancer Mother        bone  . Stroke Father   .  SOCIAL HISTORY: Social History   Socioeconomic History  . Marital status:  Married    Spouse name: Not on file  . Number of children: Not on file  . Years of education: Not on file  . Highest education level: Not on file  Occupational History  . Not on file  Social Needs  . Financial resource strain: Not on file  . Food insecurity    Worry: Not on file    Inability: Not on file  . Transportation needs    Medical: Not on file    Non-medical: Not on file  Tobacco Use  . Smoking status: Current  Every Day Smoker    Packs/day: 1.00    Years: 50.00    Pack years: 50.00    Types: Cigarettes  . Smokeless tobacco: Never Used  . Tobacco comment: down to .5 ppd  Substance and Sexual Activity  . Alcohol use: No    Alcohol/week: 0.0 standard drinks  . Drug use: No  . Sexual activity: Not on file  Lifestyle  . Physical activity    Days per week: Not on file    Minutes per session: Not on file  . Stress: Not on file  Relationships  . Social Herbalist on phone: Not on file    Gets together: Not on file    Attends religious service: Not on file    Active member of club or organization: Not on file    Attends meetings of clubs or organizations: Not on file    Relationship status: Not on file  . Intimate partner violence    Fear of current or ex partner: Not on file    Emotionally abused: Not on file    Physically abused: Not on file    Forced sexual activity: Not on file  Other Topics Concern  . Not on file  Social History Narrative   Lives with wife       One level      Right hand    REVIEW OF SYSTEMS: Constitutional: No fevers, chills, or sweats, no generalized fatigue, change in appetite Eyes: No visual changes, double vision, eye pain Ear, nose and throat: No hearing loss, ear pain, nasal congestion, sore throat Cardiovascular: No chest pain, palpitations Respiratory:  No shortness of breath at rest or with exertion, wheezes GastrointestinaI: No nausea, vomiting, diarrhea, abdominal pain, fecal incontinence Genitourinary:  No  dysuria, urinary retention or frequency Musculoskeletal:  No neck pain, back pain Integumentary: No rash, pruritus, skin lesions Neurological: as above Psychiatric: No depression, insomnia, anxiety Endocrine: No palpitations, fatigue, diaphoresis, mood swings, change in appetite, change in weight, increased thirst Hematologic/Lymphatic:  No purpura, petechiae. Allergic/Immunologic: no itchy/runny eyes, nasal congestion, recent allergic reactions, rashes  PHYSICAL EXAM: Pulse 72, height 5\' 10"  (1.778 m), weight 200 lb 3.2 oz (90.8 kg), SpO2 94 %. General: No acute distress.  Patient appears well-groomed.   Head:  Normocephalic/atraumatic Eyes:  Fundi examined but not visualized Neck: supple, no paraspinal tenderness, full range of motion Heart:  Regular rate and rhythm Lungs:  Clear to auscultation bilaterally Back: No paraspinal tenderness Neurological Exam: alert and oriented to person, place, and time. Attention span and concentration intact, recent and remote memory intact, fund of knowledge intact.  Speech fluent and not dysarthric, language intact.  CN II-XII intact. Bulk and tone normal, muscle strength 5/5 throughout.  No tremor, bradykinesia or rigidity noted.  Sensation to light touch intact.  Deep tendon reflexes 2+ throughout, toes downgoing.  Finger to nose and heel to shin testing intact.  Gait normal, no shuffling, able to turn in 2 steps; Romberg negative.  IMPRESSION: 1.  Cognitive impairment, unsteady gait, possible myoclonus vs tremor.  MRI cervical spine does not reveal any significant cord abnormality to cause myelopathy.  No evidence of neuropathy.  No clear signs of Parkinson's disease on exam.  May still be a neurodegenerative disease.  2.  Seizure-like episode.  Suspect convulsive syncope, vasovagal.  PLAN: 1.  Due to seizure-like episode, will check MRI of brain without contrast 2.  Due to seizure-like episode and episodes of tremor, will check EEG 3.  Will order  neuropsychological testing for further evaluation and diagnosis of cognitive impairment. 4.  No driving and he must have 24 hour supervision 5.  Follow up in 4 to 5 months.  25 minutes spent face to face with patient, 50% spent discussing management   Metta Clines, DO  CC: [name], MD

## 2019-09-24 ENCOUNTER — Ambulatory Visit: Payer: Medicare Other | Admitting: Neurology

## 2019-09-24 ENCOUNTER — Encounter: Payer: Self-pay | Admitting: Neurology

## 2019-09-24 ENCOUNTER — Other Ambulatory Visit: Payer: Self-pay

## 2019-09-24 ENCOUNTER — Ambulatory Visit
Admission: RE | Admit: 2019-09-24 | Discharge: 2019-09-24 | Disposition: A | Discharge status: Home / Self Care | Payer: Medicare Other | Source: Ambulatory Visit | Attending: Family Medicine | Admitting: Family Medicine

## 2019-09-24 ENCOUNTER — Other Ambulatory Visit: Payer: Medicare Other

## 2019-09-24 ENCOUNTER — Other Ambulatory Visit: Payer: Self-pay | Admitting: Family Medicine

## 2019-09-24 VITALS — HR 72 | Ht 70.0 in | Wt 200.2 lb

## 2019-09-24 DIAGNOSIS — R4189 Other symptoms and signs involving cognitive functions and awareness: Secondary | ICD-10-CM

## 2019-09-24 DIAGNOSIS — J441 Chronic obstructive pulmonary disease with (acute) exacerbation: Secondary | ICD-10-CM | POA: Diagnosis not present

## 2019-09-24 DIAGNOSIS — Z01810 Encounter for preprocedural cardiovascular examination: Secondary | ICD-10-CM

## 2019-09-24 DIAGNOSIS — R569 Unspecified convulsions: Secondary | ICD-10-CM | POA: Diagnosis not present

## 2019-09-24 DIAGNOSIS — R05 Cough: Secondary | ICD-10-CM | POA: Diagnosis not present

## 2019-09-24 DIAGNOSIS — R296 Repeated falls: Secondary | ICD-10-CM | POA: Diagnosis not present

## 2019-09-24 NOTE — Patient Instructions (Addendum)
1.  We will check MRI of brain without contrast 2.  We will check a routine EEG 3.  I will refer you for neurocognitive testing 4.  No driving 5.  Recommend that you not be left alone. 6.  Follow up in 4 to 5 months  A order for a MRI has been place for you at [facility] if you have not heard from them in 3 days or have any questions regarding your test please contact them at [phone]. Please allow this time for your insurance to approve the imaging.

## 2019-09-26 DIAGNOSIS — J449 Chronic obstructive pulmonary disease, unspecified: Secondary | ICD-10-CM | POA: Diagnosis not present

## 2019-09-26 DIAGNOSIS — S93326A Dislocation of tarsometatarsal joint of unspecified foot, initial encounter: Secondary | ICD-10-CM | POA: Diagnosis not present

## 2019-09-28 LAB — RESPIRATORY CULTURE OR RESPIRATORY AND SPUTUM CULTURE
MICRO NUMBER:: 1089317
SPECIMEN QUALITY:: ADEQUATE

## 2019-09-29 ENCOUNTER — Telehealth: Payer: Self-pay | Admitting: Primary Care

## 2019-09-29 DIAGNOSIS — J441 Chronic obstructive pulmonary disease with (acute) exacerbation: Secondary | ICD-10-CM

## 2019-09-29 NOTE — Telephone Encounter (Signed)
Please let patient know sputum culture was positive for Streptococcus pneumoniae. It is sensitive to Levaquin which he received from PCP. How is he feeling? How many days did he take Levaquin? When did he finish?

## 2019-09-30 MED ORDER — LEVOFLOXACIN 500 MG PO TABS: 500.0000 mg | ORAL_TABLET | Freq: Every day | ORAL | 0 refills | Status: DC

## 2019-09-30 NOTE — Telephone Encounter (Signed)
Bacteria is sensitive to Levaquin. We will send in another course. Was he tested for covid? If not better after completing recommend ID consult.

## 2019-09-30 NOTE — Telephone Encounter (Signed)
Called and spoke with pt's wife Anne Ng. Asked Anne Ng when it was that pt finished the levaquin and she said that it was 3 weeks ago. Anne Ng said pt was on it for 7 days and she believes it was 750mg . Three days after finishing the levaquin, pt was prescribed zpak due to not getting any better.  Anne Ng said last Wednesday, 11/11 when they came by office to drop off the sputum culture, after leaving the office pt had a bad coughing spell in the car, blacked out, and also had a seizure while in the car with her. Anne Ng said that this was discussed with neurology and she said they believe his problems are not due to any problems with his brain but due to lack of oxygen.  She also said that the cough meds that were prescribed by [facility] have helped some with the cough but she believes that pt needs to have another round of abx. Pt is also still wheezing.  Anne Ng said that pt is currently not wearing oxygen as the last day he wore the oxygen was Sunday 11/15.   Beth, please advise. Thanks!

## 2019-09-30 NOTE — Telephone Encounter (Signed)
Assessment and Plan:  COPD exacerbation - Prolonged bronchitis symptoms despite completing prednisone, Zpack and Levaquin - Continue Breo 1 puff daily and Spiriva  - Needs additional prednisone taper - Advised Mucinex 1,200mg  twice daily  - Tussionex cough syrup 101ml twice a day for cough (do not combine with other sedating/opioid medications) - Check sputum sample (avoiding further antibiotics until culture and CXR) - Recommend covid testing   Called pt's wife Anne Ng back letting her know that Eustaquio Maize wants pt to do another round of levaquin and that this was sent to pharmacy for pt and she verbalized understanding. Stated to her that if not better after this round of abx, we recommend pt to see ID for consult and she verbalized understanding. Asked her if pt was tested for covid and she said he was not. Stated to her that pt needs to go be tested for covid and she verbalized understanding. I told her the location for pt to go get tested. Order placed for the covid test. Nothing further needed.

## 2019-09-30 NOTE — Telephone Encounter (Signed)
Pt's wife calling regarding pneumonia.  Pt was told at PCP.  Needs antibiotic.  Please advise.  [phone].

## 2019-10-01 NOTE — Progress Notes (Deleted)
Cardiology Office Note:    Date:  10/01/2019   ID:  Don [name], DOB 1951/09/26, MRN [id number]  PCP:  [name], MD  Cardiologist:  Shirlee More, MD    Referring MD: [name], MD    ASSESSMENT:    No diagnosis found. PLAN:    In order of problems listed above:  1. ***   Next appointment: ***   Medication Adjustments/Labs and Tests Ordered: Current medicines are reviewed at length with the patient today.  Concerns regarding medicines are outlined above.  No orders of the defined types were placed in this encounter.  No orders of the defined types were placed in this encounter.   No chief complaint on file.   History of Present Illness:    Don [name] is a 68 y.o. male with a hx of coronary artery disease with PCI in 2000 and subsequently in 2012 with chronic total occlusion of the right coronary artery hypertension hyperlipidemia and and cardiomyopathy previous ejection fraction 45% last seen by me at [facility] cardiology 12/28/2016.  Other medical problems include COPD, CKD throat cancer previous melanoma and skin cancers.  Past surgical history is noteworthy for appendectomy back surgery Elbow surgery cholecystectomy and both knee and shoulder arthroscopic surgery... Compliance with diet, lifestyle and medications: ***  Chart review shows that an echocardiogram performed at [facility] 08/26/2019.  Left ventricular systolic function was estimated normal to mildly reduced EF 50 to 55% with mild concentric left ventricular hypertrophy.  Other findings included mild mitral regurgitation.  He also had a myocardial perfusion study performed 08/26/2019 but I cannot see the report in care everywhere. Past Medical History:  Diagnosis Date  . Arthritis   . Basal cell carcinoma   . Cancer ([place])    throat - 1997, throat - 2018  . COPD (chronic obstructive pulmonary disease) ([place])   . Coronary artery disease   . GERD (gastroesophageal reflux  disease)   . Hyperlipidemia   . Hypertension   . Lesion of vocal cord   . Melanoma of back ([place])    melanoma on back  . Myocardial infarction ([place]) 2000   2 stents  . Peripheral vascular disease ([facility])    iliac artery clot  . Pneumonia 02/2015  . Wears glasses     Past Surgical History:  Procedure Laterality Date  . ABDOMINAL ANGIOGRAM N/A 01/13/2015   Procedure: ABDOMINAL ANGIOGRAM;  Surgeon: Rosetta Posner, MD;  Location[facility] CATH LAB;  Service: Cardiovascular;  Laterality: N/A;  . APPENDECTOMY    . BACK SURGERY    . CARDIAC CATHETERIZATION  2000/2012   with stents in 2000  . CHOLECYSTECTOMY    . COLONOSCOPY    . ELBOW SURGERY     bilateral  . ESOPHAGOGASTRODUODENOSCOPY    . KNEE SURGERY Left   . MICROLARYNGOSCOPY WITH CO2 LASER AND EXCISION OF VOCAL CORD LESION N/A 11/23/2016   Procedure: MICROLARYNGOSCOPY  AND EXCISION OF VOCAL CORD LESION;  Surgeon: Melissa Montane, MD;  Location: [facility];  Service: ENT;  Laterality: N/A;  . MICROLARYNGOSCOPY WITH CO2 LASER AND EXCISION OF VOCAL CORD LESION N/A 01/11/2017   Procedure: MICROLARYNGOSCOPY WITH CO2 LASER AND EXCISION OF VOCAL CORD LESION;  Surgeon: Melissa Montane, MD;  Location: [facility];  Service: ENT;  Laterality: N/A;  . MICROLARYNGOSCOPY WITH LASER N/A 03/16/2015   Procedure: MICROLARYNGOSCOPY ;  Surgeon: Melissa Montane, MD;  Location: [facility];  Service: ENT;  Laterality: N/A;  . peyronie's surgery    .  SHOULDER SURGERY Right    rotator cuff  . THROAT SURGERY  1997   cancer removed    Current Medications: No outpatient medications have been marked as taking for the 10/02/19 encounter (Appointment) with Richardo Priest, MD.     Allergies:   Penicillins, Prednisone, Doxycycline, and Iodinated diagnostic agents   Social History   Socioeconomic History  . Marital status: Married    Spouse name: Not on file  . Number of children: 4  . Years of education: Not on file  . Highest education level: GED or equivalent  Occupational History  . Not on  file  Social Needs  . Financial resource strain: Not on file  . Food insecurity    Worry: Not on file    Inability: Not on file  . Transportation needs    Medical: Not on file    Non-medical: Not on file  Tobacco Use  . Smoking status: Current Every Day Smoker    Packs/day: 1.00    Years: 50.00    Pack years: 50.00    Types: Cigarettes  . Smokeless tobacco: Never Used  . Tobacco comment: down to .5 ppd  Substance and Sexual Activity  . Alcohol use: No    Alcohol/week: 0.0 standard drinks  . Drug use: No  . Sexual activity: Not on file  Lifestyle  . Physical activity    Days per week: Not on file    Minutes per session: Not on file  . Stress: Not on file  Relationships  . Social Herbalist on phone: Not on file    Gets together: Not on file    Attends religious service: Not on file    Active member of club or organization: Not on file    Attends meetings of clubs or organizations: Not on file    Relationship status: Not on file  Other Topics Concern  . Not on file  Social History Narrative   Lives with wife       One level      Right hand     Family History: The patient's ***family history includes Cancer in his mother; Healthy in his child; Stroke in his father. ROS:   Please see the history of present illness.    All other systems reviewed and are negative.  EKGs/Labs/Other Studies Reviewed:    The following studies were reviewed today:  EKG:  EKG ordered today and personally reviewed.  The ekg ordered today demonstrates ***  Recent Labs: 05/09/2019: TSH 2.30  Recent Lipid Panel No results found for: CHOL, TRIG, HDL, CHOLHDL, VLDL, LDLCALC, LDLDIRECT  Physical Exam:    VS:  There were no vitals taken for this visit.    Wt Readings from Last 3 Encounters:  09/24/19 200 lb 3.2 oz (90.8 kg)  05/09/19 190 lb (86.2 kg)  04/21/19 190 lb (86.2 kg)     GEN: *** Well nourished, well developed in no acute distress HEENT: Normal NECK: No JVD;  No carotid bruits LYMPHATICS: No lymphadenopathy CARDIAC: ***RRR, no murmurs, rubs, gallops RESPIRATORY:  Clear to auscultation without rales, wheezing or rhonchi  ABDOMEN: Soft, non-tender, non-distended MUSCULOSKELETAL:  No edema; No deformity  SKIN: Warm and dry NEUROLOGIC:  Alert and oriented x 3 PSYCHIATRIC:  Normal affect    Signed, Shirlee More, MD  10/01/2019 1:51 PM    [facility]

## 2019-10-02 ENCOUNTER — Ambulatory Visit: Payer: Medicare Other | Admitting: Cardiology

## 2019-10-06 ENCOUNTER — Other Ambulatory Visit: Payer: Self-pay | Admitting: Internal Medicine

## 2019-10-10 DIAGNOSIS — R0902 Hypoxemia: Secondary | ICD-10-CM | POA: Diagnosis not present

## 2019-10-12 DIAGNOSIS — J441 Chronic obstructive pulmonary disease with (acute) exacerbation: Secondary | ICD-10-CM | POA: Diagnosis not present

## 2019-10-17 ENCOUNTER — Telehealth: Payer: Self-pay | Admitting: Primary Care

## 2019-10-17 MED ORDER — PROMETHAZINE-CODEINE 6.25-10 MG/5ML PO SYRP: 5.0000 mL | ORAL_SOLUTION | Freq: Four times a day (QID) | ORAL | 0 refills | Status: DC | PRN

## 2019-10-17 MED ORDER — GUAIFENESIN ER 1200 MG PO TB12: 1.0000 | ORAL_TABLET | Freq: Two times a day (BID) | ORAL | 0 refills | Status: DC

## 2019-10-17 NOTE — Telephone Encounter (Signed)
Primary Pulmonologist: MR Last office visit and with whom: 09/16/2019 with Don [name] What do we see them for (pulmonary problems): COPD Last OV assessment/plan: Instructions  COPD exacerbation - Recommend covid testing (Don [name]) - Prednisone taper as prescribed - Mucinex 1,200mg  twice daily (will full glass of water) - Check sputum sample - Tussionex cough syrup 59ml twice a day for cough (do not combine with other sedating/opioid medications)  Follow-up - 7-10 day televisit with Don Maize NP      Was appointment offered to patient (explain)?  Pt's wife wants recommendations   Reason for call: Called and spoke with pt's wife Don [name]. Don [name] stated that pt's cough has come back again. His cough had become better after the recent round of abx but then overnight 12/3 pt began coughing again after being outside in the cold. Pt is coughing with clear phlegm and his cough is the same as it was at last visit.  Pt never was covid tested due to the abx helping with his symptoms. Don [name] said that this is also not a good time for pt to go be tested for covid due to having a granddaughter that was born stillborn on Thanksgiving and they are going to be performing a funeral on her today.  Don [name] said that pt has not had any fever.  Due to pt's cough just coming back again, Don [name] is wanting recommendations to help with pt's symptoms. Don [name], please advise. Thanks!   (examples of things to ask: : When did symptoms start? Fever? Cough? Productive? Color to sputum? More sputum than usual? Wheezing? Have you needed increased oxygen? Are you taking your respiratory medications? What over the counter measures have you tried?)

## 2019-10-17 NOTE — Telephone Encounter (Signed)
Take double strength mucinex twice daily. Will refill cough medication. Refer to ID for strep pneumoniae

## 2019-10-17 NOTE — Telephone Encounter (Signed)
Called and spoke with Anne Ng letting her know the info stated by [facility] that we were referring pt to ID and refilling cough med. Also stated to her that pt can take DS mucinex and she verbalized understanding. Nothing further needed.

## 2019-10-26 DIAGNOSIS — S93326A Dislocation of tarsometatarsal joint of unspecified foot, initial encounter: Secondary | ICD-10-CM | POA: Diagnosis not present

## 2019-10-26 DIAGNOSIS — J449 Chronic obstructive pulmonary disease, unspecified: Secondary | ICD-10-CM | POA: Diagnosis not present

## 2019-11-05 ENCOUNTER — Other Ambulatory Visit: Payer: Self-pay | Admitting: Internal Medicine

## 2019-11-08 ENCOUNTER — Other Ambulatory Visit: Payer: Self-pay | Admitting: Internal Medicine

## 2019-11-09 DIAGNOSIS — R0902 Hypoxemia: Secondary | ICD-10-CM | POA: Diagnosis not present

## 2019-11-10 ENCOUNTER — Other Ambulatory Visit: Payer: Self-pay | Admitting: Internal Medicine

## 2019-11-10 NOTE — Telephone Encounter (Signed)
Received faxed refill request from [place]  Medication name/strength/dose: Amlodipine 5mg  1 tablet daily  Medication last rx'd: 10/06/19 by MR Quantity and number of refills last rx'd: 30, w/0 refills  Patient last seen in the office on 09/16/19 with BW   MR please advise on refill

## 2019-11-11 DIAGNOSIS — J441 Chronic obstructive pulmonary disease with (acute) exacerbation: Secondary | ICD-10-CM | POA: Diagnosis not present

## 2019-11-12 IMAGING — MR MRI CERVICAL SPINE WITHOUT CONTRAST
4 of 5 series · 16 of 48 positions shown · non-contrast
Comparison: Radiography 04/06/2009

CLINICAL DATA: Weakness and falling over the last year. Previous
cervical fusion.

EXAM:
MRI CERVICAL SPINE WITHOUT CONTRAST
TECHNIQUE: Multiplanar, multisequence MR imaging of the cervical spine was
performed. No intravenous contrast was administered.

[Series 5: T1 · sagittal · 3.0mm · 0.41mm/px · 3 of 13 slices shown]
[im 3/13]
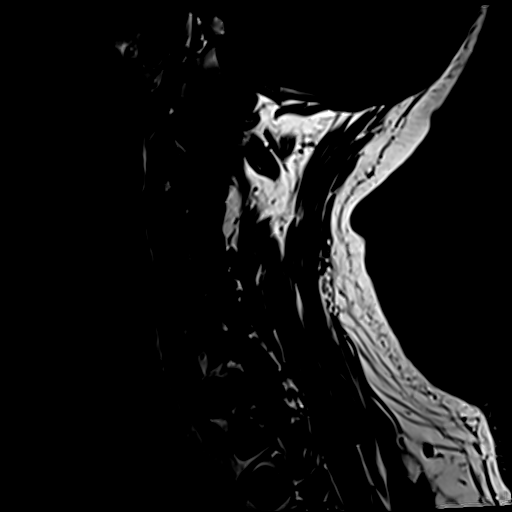
[im 8/13]
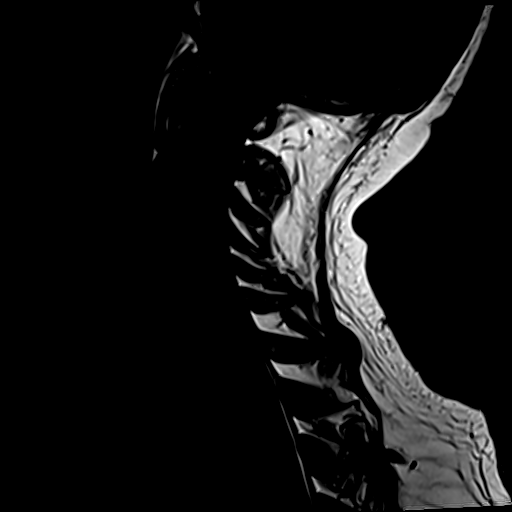
[im 13/13]
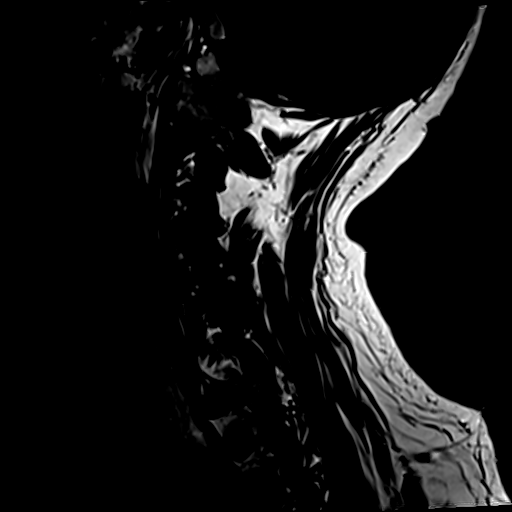

[Series 6: T2 · sagittal · 3.0mm · 0.33mm/px · 6 of 13 slices shown (1 of 2)]
[im 1/13]
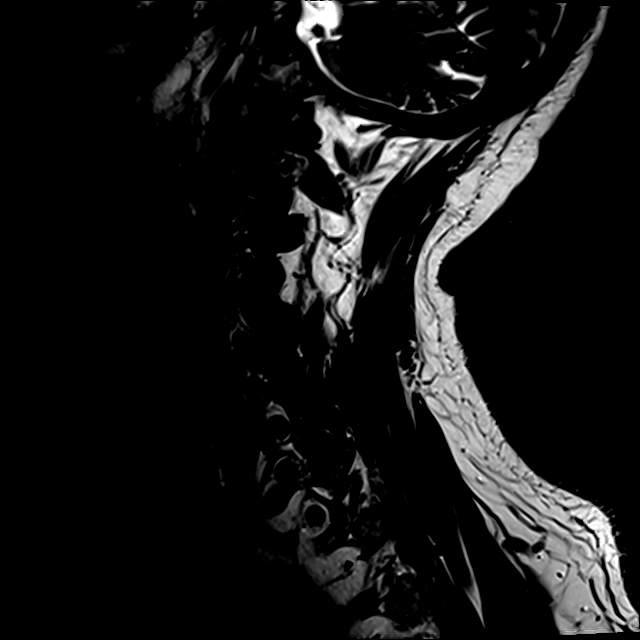
[im 3/13]
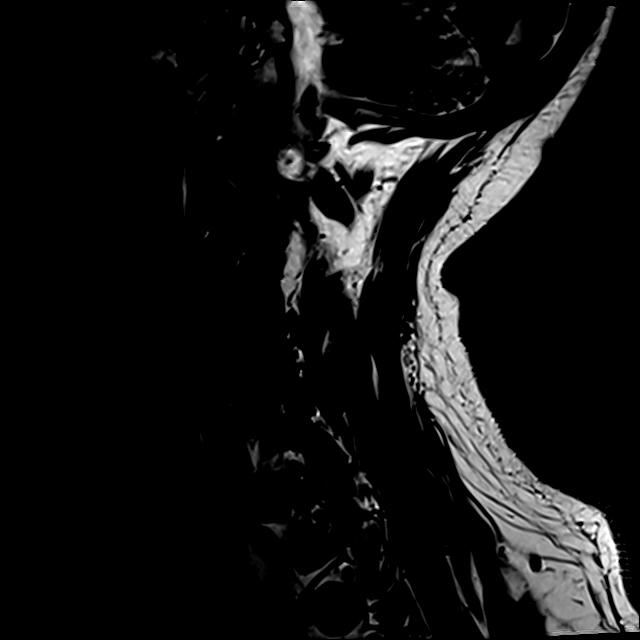
[im 5/13]
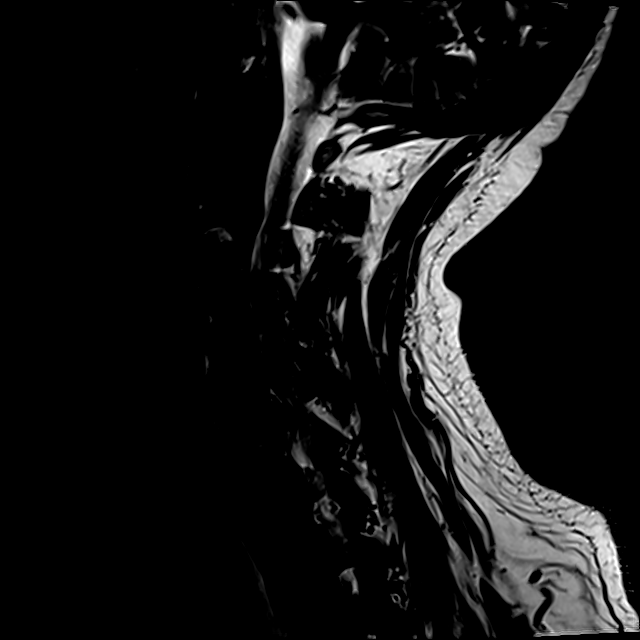
[im 8/13]
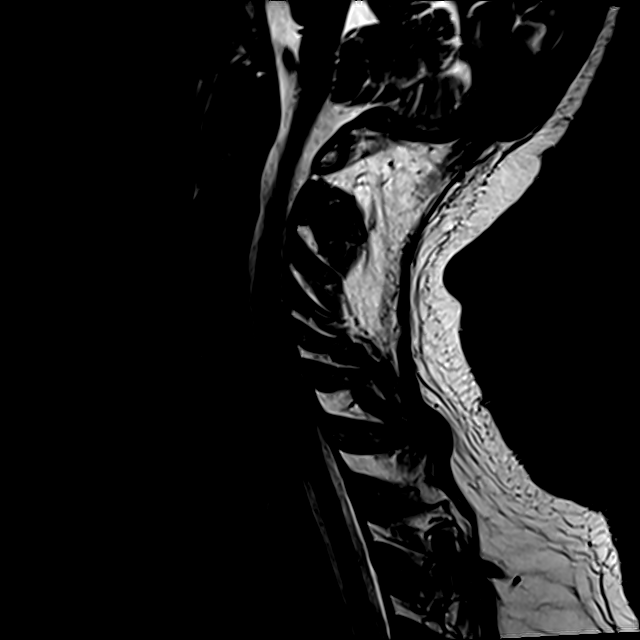
[im 10/13]
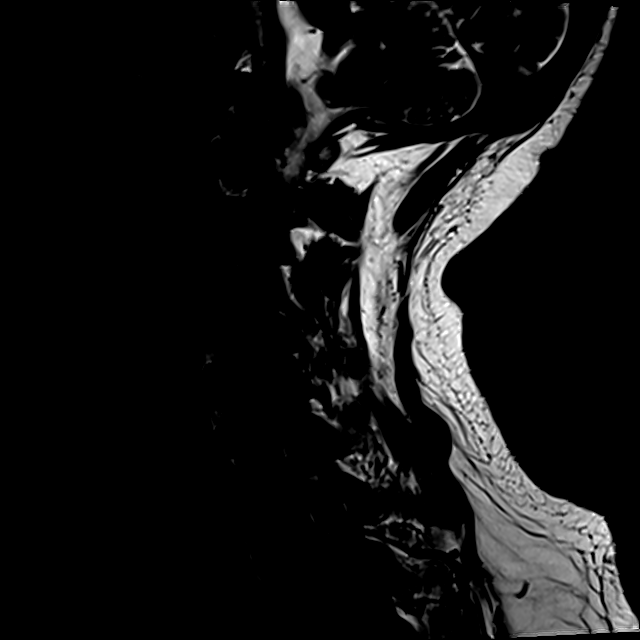
[im 13/13]
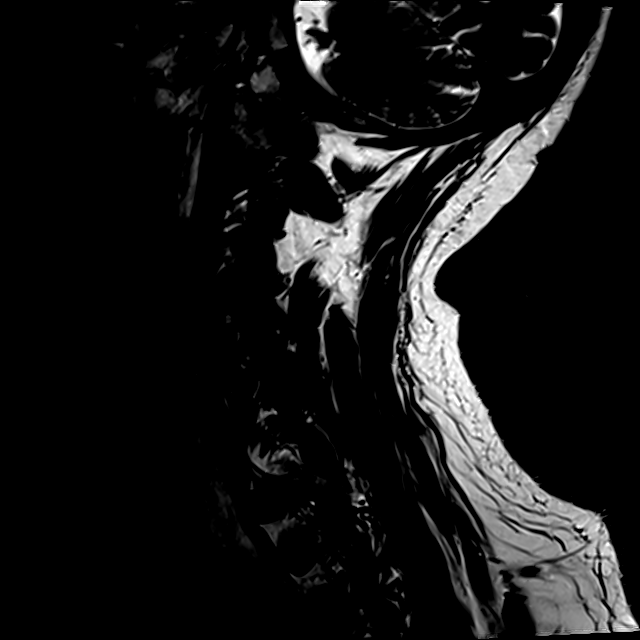

[Series 7: STIR · sagittal · 3.0mm · 0.41mm/px · 3 of 13 slices shown]
[im 3/13]
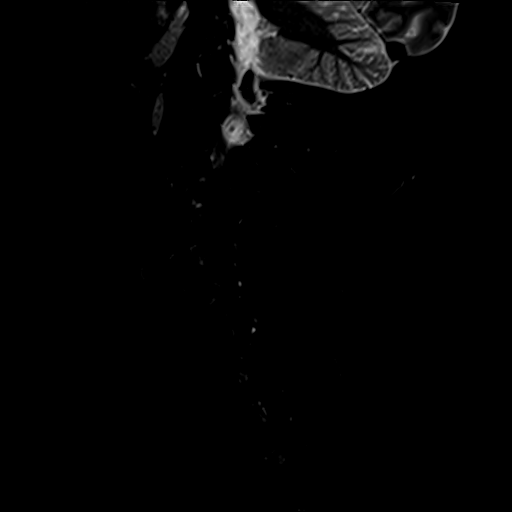
[im 8/13]
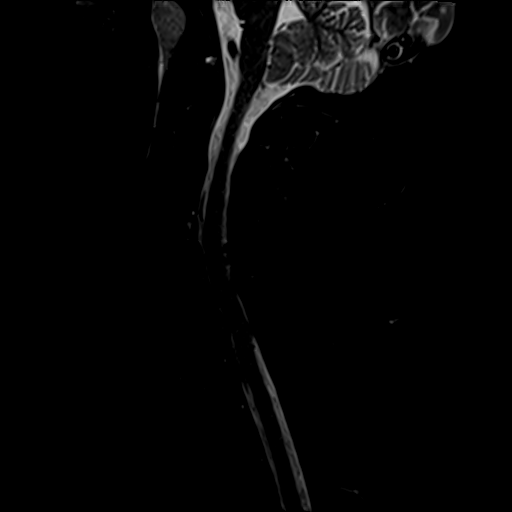
[im 13/13]
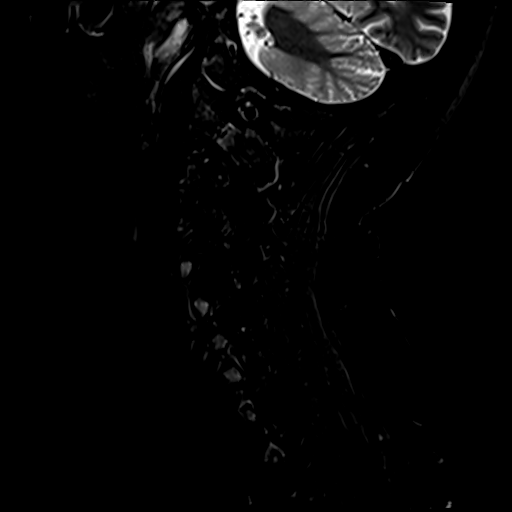

[Series 8: T2 · axial · 3.0mm · 0.31mm/px · z∈[-83,+1]mm · 4 of 30 slices shown (2 of 2)]
[im 1/30]
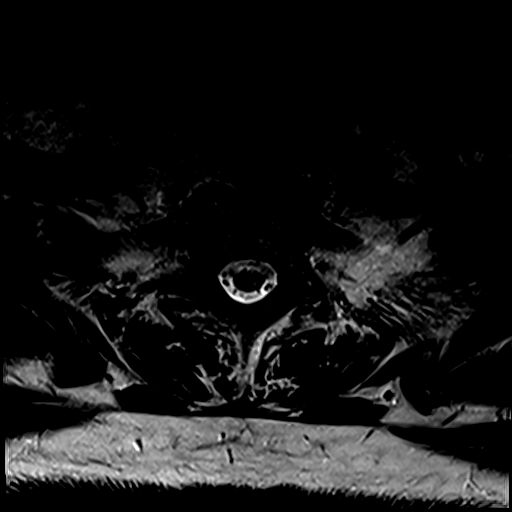
[im 5/30]
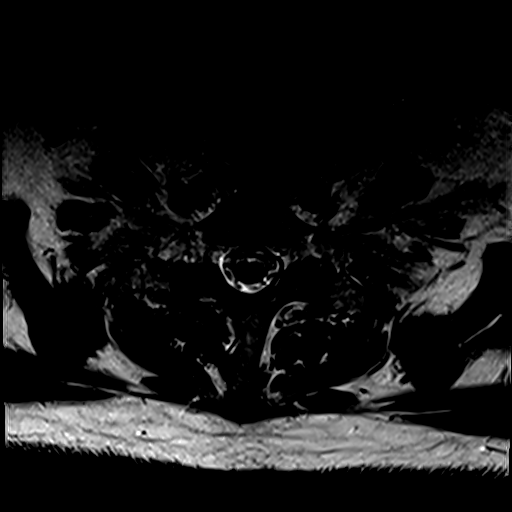
[im 15/30]
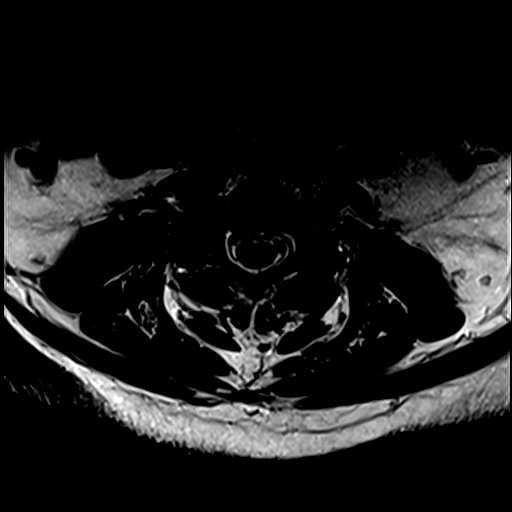
[im 25/30]
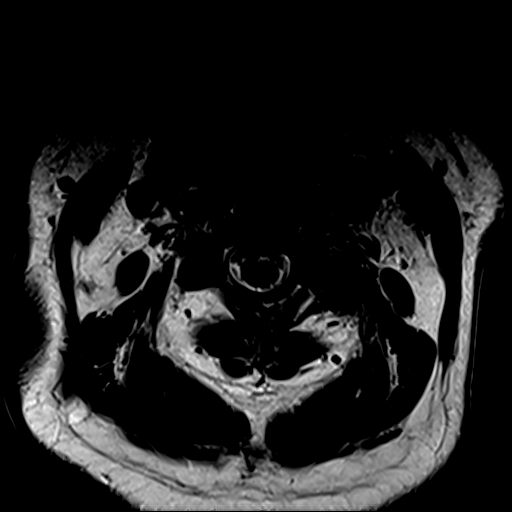

[16 of 48 positions shown; findings below may reference images not displayed]

FINDINGS: Alignment: Normal

Vertebrae: Distant ACDF C3-4.  No acute bone finding.

Cord: No primary cord lesion.  See below regarding stenosis.

Posterior Fossa, vertebral arteries, paraspinal tissues: Negative

Disc levels:

No abnormality at the foramen magnum or C1-2.

C2-3: Mild bulging of the disc. Facet osteoarthritis on the left. No
compressive canal stenosis. Foraminal narrowing on the left that
could affect the C3 nerve.

C3-4: Previous ACDF has a good appearance with wide patency of the
canal and foramina.

C4-5: Mild bulging of the disc. Mild facet degeneration right more
than left. Bilateral foraminal narrowing right more than left. Some
potential that either C5 nerve could be affected.

C5-6: Endplate osteophytes and bulging of the disc more towards the
left. Narrowing of the ventral subarachnoid space but no compression
of the cord. Mild left foraminal narrowing.

C6-7: Spondylosis with endplate osteophytes and protruding disc
material more prominent towards the left. Effacement of the ventral
subarachnoid space with slight flattening of the cord on the left.
AP diameter in the midline 10 mm. Left foraminal stenosis that could
compress the left C7 nerve.

C7-T1: Normal interspace.
IMPRESSION: C6-7: Left-sided predominant osteophytes and disc herniation. Mild
deformity of the left side of the cord. AP diameter in the midline
is 1 cm. Left foraminal stenosis that could compress the left C7
nerve.

Previous ACDF at C3-4 with a good appearance.

C2-3: Left-sided spondylosis and facet arthritis with left foraminal
narrowing that could affect the C3 nerve.

C4-5: Spondylosis and facet degeneration with bilateral foraminal
narrowing that could affect either C5 nerve.

C5-6: Mild spondylosis more prominent on the left with mild left
foraminal narrowing.

## 2019-11-26 DIAGNOSIS — J449 Chronic obstructive pulmonary disease, unspecified: Secondary | ICD-10-CM | POA: Diagnosis not present

## 2019-11-26 DIAGNOSIS — S93326A Dislocation of tarsometatarsal joint of unspecified foot, initial encounter: Secondary | ICD-10-CM | POA: Diagnosis not present

## 2019-12-09 ENCOUNTER — Encounter: Payer: Medicare Other | Admitting: Psychology

## 2019-12-10 DIAGNOSIS — R0902 Hypoxemia: Secondary | ICD-10-CM | POA: Diagnosis not present

## 2019-12-12 DIAGNOSIS — J441 Chronic obstructive pulmonary disease with (acute) exacerbation: Secondary | ICD-10-CM | POA: Diagnosis not present

## 2019-12-16 ENCOUNTER — Telehealth: Payer: Medicare Other | Admitting: Psychology

## 2019-12-22 ENCOUNTER — Ambulatory Visit: Payer: Medicare Other | Admitting: Family Medicine

## 2019-12-27 DIAGNOSIS — S93326A Dislocation of tarsometatarsal joint of unspecified foot, initial encounter: Secondary | ICD-10-CM | POA: Diagnosis not present

## 2019-12-27 DIAGNOSIS — J449 Chronic obstructive pulmonary disease, unspecified: Secondary | ICD-10-CM | POA: Diagnosis not present

## 2019-12-29 ENCOUNTER — Other Ambulatory Visit: Payer: Self-pay | Admitting: Family Medicine

## 2020-01-07 ENCOUNTER — Other Ambulatory Visit: Payer: Medicare Other

## 2020-01-07 ENCOUNTER — Other Ambulatory Visit: Payer: Self-pay | Admitting: Family Medicine

## 2020-01-07 ENCOUNTER — Ambulatory Visit: Payer: Medicare Other | Admitting: Family Medicine

## 2020-01-10 DIAGNOSIS — R0902 Hypoxemia: Secondary | ICD-10-CM | POA: Diagnosis not present

## 2020-01-11 DIAGNOSIS — J441 Chronic obstructive pulmonary disease with (acute) exacerbation: Secondary | ICD-10-CM | POA: Diagnosis not present

## 2020-01-20 ENCOUNTER — Other Ambulatory Visit: Payer: Self-pay | Admitting: Family Medicine

## 2020-02-03 ENCOUNTER — Other Ambulatory Visit: Payer: Self-pay | Admitting: Family Medicine

## 2020-02-06 ENCOUNTER — Other Ambulatory Visit: Payer: Self-pay | Admitting: Family Medicine

## 2020-02-07 DIAGNOSIS — R0902 Hypoxemia: Secondary | ICD-10-CM | POA: Diagnosis not present

## 2020-02-09 DIAGNOSIS — J441 Chronic obstructive pulmonary disease with (acute) exacerbation: Secondary | ICD-10-CM | POA: Diagnosis not present

## 2020-02-10 DIAGNOSIS — M5416 Radiculopathy, lumbar region: Secondary | ICD-10-CM | POA: Diagnosis not present

## 2020-02-10 DIAGNOSIS — I519 Heart disease, unspecified: Secondary | ICD-10-CM | POA: Diagnosis not present

## 2020-02-10 DIAGNOSIS — I2510 Atherosclerotic heart disease of native coronary artery without angina pectoris: Secondary | ICD-10-CM | POA: Diagnosis not present

## 2020-02-10 DIAGNOSIS — I119 Hypertensive heart disease without heart failure: Secondary | ICD-10-CM | POA: Diagnosis not present

## 2020-02-10 DIAGNOSIS — E782 Mixed hyperlipidemia: Secondary | ICD-10-CM | POA: Diagnosis not present

## 2020-02-16 ENCOUNTER — Telehealth: Payer: Self-pay

## 2020-02-16 NOTE — Telephone Encounter (Signed)
Anne Ng called with concerns about Don [name].  He is having back, legs and shoulder pain and wants to discuss possibly of getting a PET scan.  He has an appointment later this month to discuss this with Dr. Tobie Poet.  He is scheduled for a cardiac cath on Friday to clear him for a surgical procedure.

## 2020-02-20 DIAGNOSIS — E782 Mixed hyperlipidemia: Secondary | ICD-10-CM | POA: Diagnosis not present

## 2020-02-20 DIAGNOSIS — I2582 Chronic total occlusion of coronary artery: Secondary | ICD-10-CM | POA: Diagnosis not present

## 2020-02-20 DIAGNOSIS — R918 Other nonspecific abnormal finding of lung field: Secondary | ICD-10-CM | POA: Diagnosis not present

## 2020-02-20 DIAGNOSIS — Z955 Presence of coronary angioplasty implant and graft: Secondary | ICD-10-CM | POA: Diagnosis not present

## 2020-02-20 DIAGNOSIS — F17210 Nicotine dependence, cigarettes, uncomplicated: Secondary | ICD-10-CM | POA: Diagnosis not present

## 2020-02-20 DIAGNOSIS — Z7982 Long term (current) use of aspirin: Secondary | ICD-10-CM | POA: Diagnosis not present

## 2020-02-20 DIAGNOSIS — I10 Essential (primary) hypertension: Secondary | ICD-10-CM | POA: Diagnosis not present

## 2020-02-20 DIAGNOSIS — I25118 Atherosclerotic heart disease of native coronary artery with other forms of angina pectoris: Secondary | ICD-10-CM | POA: Diagnosis not present

## 2020-02-22 NOTE — Progress Notes (Deleted)
NEUROLOGY FOLLOW UP OFFICE NOTE  Don [name] PO:[id number]  HISTORY OF PRESENT ILLNESS: Don [name] is a 69 year old Caucasian man with hypertension, CAD, COPD, peripheral vascular disease, arthritis and history of throat cancer, melanoma and throat cancer who follows up for cognitive impairment and unsteady gait. History supplemented by referring provider note. He is accompanied by his wife who supplements history.  UPDATE: On 09/24/2019, he started having a coughing fit and he lost consciousness and had brief episode of shaking "like a seizure" for 2 to 3 minutes.  No postictal confusion.  ***  HISTORY: Since 2018, he has had trouble with balance. Since November 2019, it started to gradually get worse. When he stands and walks, he feels unsteady. His legs feel weak and will often give out under him. He has had several falls. He broke his right foot 3 weeks ago as a result of a fall. He also has had short term memory issues. He frequently forgets conversations and repeats questions. He used to handle the finances but his wife started paying the bills last year due to cognitive difficulties. Sometimes either arm will shake or jerk uncontrollably. It is not particularly related to any action or position of his arm. He denies pain in the legs. He has occasionally had urinary incontinence. He has past history of cervical spine surgery but currently denies any neck pain.  He had an MRI of the brain without contrast on 05/05/19 which was personally reviewed anddemonstrated global cerebral atrophyand mild chronic small vessel ischemic changes.  MRI Cervical spine WO from 05/31/2019 personally reviewed and demonstrated previous ACDF at C3-4 as well as multilevel spondylosis with left-sided predominant osteophytes and disc herniation causing mild deformity of the left side of the cord.  NCV-EMG on lower extremities on 09/18/2019 was normal.  He also has trouble swallowing food.  He  has history of throat cancer and had esophageal dilation.    He denies family history of Alzheimer's disease or other neurodegenerative disorders.  PAST MEDICAL HISTORY: Past Medical History:  Diagnosis Date  . Arthritis   . Asymptomatic LV dysfunction 10/18/2017  . Basal cell carcinoma   . Benign prostatic hyperplasia with incomplete bladder emptying 01/14/2019  . Cancer ([place])    throat - 1997, throat - 2018  . Cardiomyopathy, secondary ([place]) 12/01/2016   Overview:  EF 47% 12/26/16  . Chest pain syndrome 11/02/2017  . Chronic coronary artery disease 10/18/2017  . CKD (chronic kidney disease) stage 3, GFR 30-59 ml/min 07/25/2019  . COPD (chronic obstructive pulmonary disease) ([place])   . Coronary artery disease   . Coronary artery disease involving native coronary artery of native heart with angina pectoris ([place]) 12/01/2016   Overview:  He has hx of IWMI in remote past, last cath in 2012 at [facility] showed chronic total occlusion of previously stented RCA, with good collaterals, a 40% LAD stenosis, inferior hypokinesis and EF 45%.    He's been lost to Cardiology f/u since 2015, but has not had any recurrent events  . Dyslipidemia 12/01/2016  . Erectile dysfunction due to diseases classified elsewhere 06/12/2019  . Essential hypertension 10/18/2017  . GERD (gastroesophageal reflux disease)   . Hoarseness 11/15/2016  . Hyperlipidemia   . Hypertension   . Lesion of vocal cord   . Leukoplakia of larynx 11/15/2016  . Malfunction of penile prosthesis ([place]) 01/14/2019  . Melanoma of back ([place])    melanoma on back  . Myocardial infarction ([place]) 2000   2 stents  .  PAD (peripheral artery disease) ([place]) 10/18/2017  . Peripheral vascular disease ([facility])    iliac artery clot  . Peyronie's disease 06/12/2019  . Pharyngoesophageal dysphagia 08/12/2018  . Pneumonia 02/2015  . Preoperative clearance 04/03/2018  . Squamous cell carcinoma of larynx ([place]) 12/13/2016  . Wears glasses     MEDICATIONS: Current Outpatient  Medications on File Prior to Visit  Medication Sig Dispense Refill  . ALPRAZolam (XANAX) 0.25 MG tablet Take 1 tablet by mouth twice daily 60 tablet 0  . amLODipine (NORVASC) 5 MG tablet TAKE 1 TABLET BY MOUTH ONCE DAILY . APPOINTMENT REQUIRED FOR FUTURE REFILLS 30 tablet 0  . aspirin 81 MG tablet Take 162 mg by mouth daily.     Marland Kitchen BREO ELLIPTA 100-25 MCG/INH AEPB INHALE 1 PUFF BY MOUTH ONCE DAILY    . clopidogrel (PLAVIX) 75 MG tablet TAKE 1 TABLET BY MOUTH ONCE DAILY 30 tablet 10  . cyclobenzaprine (FLEXERIL) 10 MG tablet TAKE 1 TABLET BY MOUTH THREE TIMES DAILY AS NEEDED FOR BACK SPASMS 90 tablet 0  . DULoxetine (CYMBALTA) 60 MG capsule Take 60 mg by mouth 2 (two) times daily.     Marland Kitchen gabapentin (NEURONTIN) 300 MG capsule TAKE 1 CAPSULE BY MOUTH THREE TIMES DAILY 270 capsule 0  . Guaifenesin (MUCINEX MAXIMUM STRENGTH) 1200 MG TB12 Take 1 tablet (1,200 mg total) by mouth 2 (two) times daily. 20 tablet 0  . ipratropium-albuterol (DUONEB) 0.5-2.5 (3) MG/3ML SOLN Take 3 mLs by nebulization every 6 (six) hours as needed (for shortness of breath/wheezing. (with bronchitis)).    . Lancets (ONETOUCH DELICA PLUS 123XX123) MISC USE 1 UNIT TO CHECK GLUCOSE THREE TIMES DAILY    . levofloxacin (LEVAQUIN) 500 MG tablet Take 1 tablet (500 mg total) by mouth daily. 10 tablet 0  . metFORMIN (GLUCOPHAGE) 500 MG tablet daily with breakfast.   0  . metoprolol (LOPRESSOR) 50 MG tablet Take 50 mg by mouth 2 (two) times daily.     Marland Kitchen omeprazole (PRILOSEC) 20 MG capsule Take 1 capsule by mouth once daily 90 capsule 0  . ONETOUCH ULTRA test strip USE 1 STRIP TO CHECK GLUCOSE TWICE DAILY    . predniSONE (DELTASONE) 10 MG tablet Take 4 tabs po daily x 2 days; then 3 tabs for 2 days; then 2 tabs for 2 days; then 1 tab for 2 days 20 tablet 0  . promethazine-codeine (PHENERGAN WITH CODEINE) 6.25-10 MG/5ML syrup Take 5 mLs by mouth every 6 (six) hours as needed for cough. 140 mL 0  . rosuvastatin (CRESTOR) 20 MG tablet TAKE 1  TABLET BY MOUTH AT NIGHT 90 tablet 0  . SPIRIVA RESPIMAT 2.5 MCG/ACT AERS Inhale 2 puffs into the lungs daily.     . tamsulosin (FLOMAX) 0.4 MG CAPS capsule TAKE 1 CAPSULE BY MOUTH ONCE DAILY AT BEDTIME    . TOUJEO SOLOSTAR 300 UNIT/ML SOPN 30 Units daily.   0  . traMADol (ULTRAM) 50 MG tablet TAKE 1 TABLET BY MOUTH EVERY 6 HOURS AS NEEDED FOR BACK PAIN 20 tablet 0  . traZODone (DESYREL) 150 MG tablet Take 150 mg by mouth at bedtime.    . VENTOLIN HFA 108 (90 Base) MCG/ACT inhaler prn  0   No current facility-administered medications on file prior to visit.    ALLERGIES: Allergies  Allergen Reactions  . Penicillins Rash and Hives    Has patient had a PCN reaction causing immediate rash, facial/tongue/throat swelling, SOB or lightheadedness with hypotension:unsure Has patient had a PCN reaction  causing severe rash involving mucus membranes or skin necrosis:unsure Has patient had a PCN reaction that required hospitalization:No Has patient had a PCN reaction occurring within the last 10 years:No If all of the above answers are "NO", then may proceed with Cephalosporin use.  rash  . Prednisone     Depressed mood - can tolerate it, but it makes him very irritable  . Doxycycline Rash  . Iodinated Diagnostic Agents Rash    Also developed blisters Also developed blisters    FAMILY HISTORY: Family History  Problem Relation Age of Onset  . Cancer Mother        bone  . Stroke Father   . Healthy Child     SOCIAL HISTORY: Social History   Socioeconomic History  . Marital status: Married    Spouse name: Not on file  . Number of children: 4  . Years of education: Not on file  . Highest education level: GED or equivalent  Occupational History  . Not on file  Tobacco Use  . Smoking status: Current Every Day Smoker    Packs/day: 1.00    Years: 50.00    Pack years: 50.00    Types: Cigarettes  . Smokeless tobacco: Never Used  . Tobacco comment: down to .5 ppd  Substance and  Sexual Activity  . Alcohol use: No    Alcohol/week: 0.0 standard drinks  . Drug use: No  . Sexual activity: Not on file  Other Topics Concern  . Not on file  Social History Narrative   Lives with wife       One level      Right hand   Social Determinants of Health   Financial Resource Strain:   . Difficulty of Paying Living Expenses:   Food Insecurity:   . Worried About Charity fundraiser in the Last Year:   . Arboriculturist in the Last Year:   Transportation Needs:   . Film/video editor (Medical):   Marland Kitchen Lack of Transportation (Non-Medical):   Physical Activity:   . Days of Exercise per Week:   . Minutes of Exercise per Session:   Stress:   . Feeling of Stress :   Social Connections:   . Frequency of Communication with Friends and Family:   . Frequency of Social Gatherings with Friends and Family:   . Attends Religious Services:   . Active Member of Clubs or Organizations:   . Attends Archivist Meetings:   Marland Kitchen Marital Status:   Intimate Partner Violence:   . Fear of Current or Ex-Partner:   . Emotionally Abused:   Marland Kitchen Physically Abused:   . Sexually Abused:     PHYSICAL EXAM: *** General: No acute distress.  Patient appears ***-groomed.   Head:  Normocephalic/atraumatic Eyes:  Fundi examined but not visualized Neck: supple, no paraspinal tenderness, full range of motion Heart:  Regular rate and rhythm Lungs:  Clear to auscultation bilaterally Back: No paraspinal tenderness Neurological Exam: alert and oriented to person, place, and time. Attention span and concentration intact, recent and remote memory intact, fund of knowledge intact.  Speech fluent and not dysarthric, language intact.  CN II-XII intact. Bulk and tone normal, muscle strength 5/5 throughout.  Sensation to light touch, temperature and vibration intact.  Deep tendon reflexes 2+ throughout, toes downgoing.  Finger to nose and heel to shin testing intact.  Gait normal, Romberg  negative.  IMPRESSION: ***  PLAN: ***  Metta Clines, DO  CC: ***

## 2020-02-23 ENCOUNTER — Ambulatory Visit: Payer: Medicare Other | Admitting: Neurology

## 2020-02-23 ENCOUNTER — Other Ambulatory Visit: Payer: Self-pay | Admitting: Family Medicine

## 2020-02-25 ENCOUNTER — Ambulatory Visit: Payer: Medicare Other | Admitting: Family Medicine

## 2020-02-25 DIAGNOSIS — Z01812 Encounter for preprocedural laboratory examination: Secondary | ICD-10-CM | POA: Diagnosis not present

## 2020-02-25 DIAGNOSIS — Z20822 Contact with and (suspected) exposure to covid-19: Secondary | ICD-10-CM | POA: Diagnosis not present

## 2020-02-25 DIAGNOSIS — T83490A Other mechanical complication of penile (implanted) prosthesis, initial encounter: Secondary | ICD-10-CM | POA: Diagnosis not present

## 2020-03-01 DIAGNOSIS — E119 Type 2 diabetes mellitus without complications: Secondary | ICD-10-CM | POA: Diagnosis not present

## 2020-03-01 DIAGNOSIS — Z794 Long term (current) use of insulin: Secondary | ICD-10-CM | POA: Diagnosis not present

## 2020-03-02 ENCOUNTER — Other Ambulatory Visit: Payer: Self-pay | Admitting: Family Medicine

## 2020-03-03 DIAGNOSIS — T83410A Breakdown (mechanical) of penile (implanted) prosthesis, initial encounter: Secondary | ICD-10-CM | POA: Diagnosis not present

## 2020-03-03 DIAGNOSIS — T83490A Other mechanical complication of penile (implanted) prosthesis, initial encounter: Secondary | ICD-10-CM | POA: Diagnosis not present

## 2020-03-03 DIAGNOSIS — N529 Male erectile dysfunction, unspecified: Secondary | ICD-10-CM | POA: Diagnosis not present

## 2020-03-03 DIAGNOSIS — E119 Type 2 diabetes mellitus without complications: Secondary | ICD-10-CM | POA: Diagnosis not present

## 2020-03-04 ENCOUNTER — Encounter: Payer: Self-pay | Admitting: Family Medicine

## 2020-03-04 DIAGNOSIS — N529 Male erectile dysfunction, unspecified: Secondary | ICD-10-CM | POA: Diagnosis not present

## 2020-03-04 DIAGNOSIS — T83490A Other mechanical complication of penile (implanted) prosthesis, initial encounter: Secondary | ICD-10-CM | POA: Diagnosis not present

## 2020-03-04 DIAGNOSIS — E119 Type 2 diabetes mellitus without complications: Secondary | ICD-10-CM | POA: Diagnosis not present

## 2020-03-04 NOTE — Progress Notes (Signed)
Established Patient Office Visit  Subjective:  Patient ID: Don [name], male    DOB: [birth year]  Age: 69 y.o. MRN: [id number]  CC:  Chief Complaint  Patient presents with  . Diabetes  . Hyperlipidemia  . Hypertension  . Pain    Bilateral shoulder and hips hurting. Patient and wife are requesting a PET scan due to patients family hx (mother) of bone cancer  . Memory Loss    See 6CIT    HPI   Pt presents with hyperlipidemia.  Date of diagnosis 2000.  Current treatment includes Crestor.  Compliance with treatment has been good; he takes his medication as directed and follows up as directed.  Follows a low-cholesterol diet.  He denies experiencing any hypercholesterolemia related symptoms.      Pt presents for follow up of hypertension.  His current cardiac medication regimen includes amlodipine,  and a beta-blocker ( metoprolol ).  He is tolerating the medication well without side effects.  Compliance with treatment has been good; he takes his medication as directed, maintains his diet, and follows up as directed.      Additionally, he presents with history of type 2 diabetes mellitus with diabetic polyneuropathy.  specifically, this is type 2, non-insulin requiring diabetes without complications.  Compliance with treatment has been good; he takes his medication as directed, maintains his diet and exercise regimen, follows up as directed, and is keeping a glucose diary.  Current meds include an oral hypoglycemic ( Glucophage ( 500mg  qd ) ) and insulin/injectable ( Toujeo 30 U daily ). He checks his glucose 1 times per day. His most recent a1c was 6.8. In regard to preventative care, he performs foot exams daily. Concurrent health problems include hypertension and hypercholesterolemia.    Patient is complaining of severe pain in his bilateral shoulders and bilateral hips.  Denies pain in his elbows wrists hands knees or feet.  He had a recent surgery for Peyronie's (removal of penile  implant.)  He was given tramadol for this surgery and he noted there was no improvement in his muscular skeletal pain.  Patient does Flexeril regularly, but again this does not help.  Patient COPD is well controlled on Spiriva 2 puffs daily, Breo 1 inhalation daily, Ventolin HFA I 2 puffs 4 times a day as needed.  Denies shortness of breath, coughing or chest pain.  He does have home oxygen which he wears at night at 2 L.   Past Medical History:  Diagnosis Date  . Arthritis   . Asymptomatic LV dysfunction 10/18/2017  . Basal cell carcinoma   . Benign prostatic hyperplasia with incomplete bladder emptying 01/14/2019  . Cancer ([place])    throat - 1997, throat - 2018  . Cardiomyopathy, secondary ([place]) 12/01/2016   Overview:  EF 47% 12/26/16  . Chest pain syndrome 11/02/2017  . Chronic coronary artery disease 10/18/2017  . CKD (chronic kidney disease) stage 3, GFR 30-59 ml/min 07/25/2019  . COPD (chronic obstructive pulmonary disease) ([place])   . Coronary artery disease   . Coronary artery disease involving native coronary artery of native heart with angina pectoris ([place]) 12/01/2016   Overview:  He has hx of IWMI in remote past, last cath in 2012 at [facility] showed chronic total occlusion of previously stented RCA, with good collaterals, a 40% LAD stenosis, inferior hypokinesis and EF 45%.    He's been lost to Cardiology f/u since 2015, but has not had any recurrent events  . Depression   . Dyslipidemia  12/01/2016  . Emphysema lung ([place])   . Erectile dysfunction due to diseases classified elsewhere 06/12/2019  . Essential hypertension 10/18/2017  . GERD (gastroesophageal reflux disease)   . Hoarseness 11/15/2016  . Hyperlipidemia   . Hypertension   . Lesion of vocal cord   . Leukoplakia of larynx 11/15/2016  . Malfunction of penile prosthesis ([place]) 01/14/2019  . Melanoma of back ([place])    melanoma on back  . Myocardial infarction ([place]) 2000   2 stents  . PAD (peripheral artery disease) ([place]) 10/18/2017  .  Peripheral vascular disease ([facility])    iliac artery clot  . Peyronie's disease 06/12/2019  . Pharyngoesophageal dysphagia 08/12/2018  . Pneumonia 02/2015  . Preoperative clearance 04/03/2018  . Squamous cell carcinoma of larynx ([place]) 12/13/2016  . Wears glasses     Past Surgical History:  Procedure Laterality Date  . ABDOMINAL ANGIOGRAM N/A 01/13/2015   Procedure: ABDOMINAL ANGIOGRAM;  Surgeon: Rosetta Posner, MD;  Location: [facility] CATH LAB;  Service: Cardiovascular;  Laterality: N/A;  . APPENDECTOMY    . BACK SURGERY    . CARDIAC CATHETERIZATION  2000/2012   with stents in 2000  . CHOLECYSTECTOMY    . COLONOSCOPY    . ELBOW SURGERY     bilateral  . ESOPHAGOGASTRODUODENOSCOPY    . KNEE SURGERY Left   . MICROLARYNGOSCOPY WITH CO2 LASER AND EXCISION OF VOCAL CORD LESION N/A 11/23/2016   Procedure: MICROLARYNGOSCOPY  AND EXCISION OF VOCAL CORD LESION;  Surgeon: Melissa Montane, MD;  Location: [place];  Service: ENT;  Laterality: N/A;  . MICROLARYNGOSCOPY WITH CO2 LASER AND EXCISION OF VOCAL CORD LESION N/A 01/11/2017   Procedure: MICROLARYNGOSCOPY WITH CO2 LASER AND EXCISION OF VOCAL CORD LESION;  Surgeon: Melissa Montane, MD;  Location: [place];  Service: ENT;  Laterality: N/A;  . MICROLARYNGOSCOPY WITH LASER N/A 03/16/2015   Procedure: MICROLARYNGOSCOPY ;  Surgeon: Melissa Montane, MD;  Location: [place];  Service: ENT;  Laterality: N/A;  . peyronie's surgery    . SHOULDER SURGERY Right    rotator cuff  . THROAT SURGERY  1997   cancer removed    Family History  Problem Relation Age of Onset  . Cancer Mother        bone  . Stroke Father   . Healthy Child   . Cancer Other        brain  . Diabetes Other     Social History   Socioeconomic History  . Marital status: Married    Spouse name: Not on file  . Number of children: 4  . Years of education: Not on file  . Highest education level: GED or equivalent  Occupational History  . Occupation: retired  Tobacco Use  . Smoking status: Current Every Day  Smoker    Packs/day: 1.00    Years: 50.00    Pack years: 50.00    Types: Cigarettes  . Smokeless tobacco: Never Used  . Tobacco comment: down to .5 ppd  Substance and Sexual Activity  . Alcohol use: No    Alcohol/week: 0.0 standard drinks  . Drug use: No  . Sexual activity: Not on file  Other Topics Concern  . Not on file  Social History Narrative   Lives with wife       One level      Right hand   Social Determinants of Health   Financial Resource Strain:   . Difficulty of Paying Living Expenses:   Food Insecurity:   .  Worried About Charity fundraiser in the Last Year:   . Arboriculturist in the Last Year:   Transportation Needs:   . Film/video editor (Medical):   Marland Kitchen Lack of Transportation (Non-Medical):   Physical Activity:   . Days of Exercise per Week:   . Minutes of Exercise per Session:   Stress:   . Feeling of Stress :   Social Connections:   . Frequency of Communication with Friends and Family:   . Frequency of Social Gatherings with Friends and Family:   . Attends Religious Services:   . Active Member of Clubs or Organizations:   . Attends Archivist Meetings:   Marland Kitchen Marital Status:   Intimate Partner Violence:   . Fear of Current or Ex-Partner:   . Emotionally Abused:   Marland Kitchen Physically Abused:   . Sexually Abused:     Outpatient Medications Prior to Visit  Medication Sig Dispense Refill  . acetaminophen (TYLENOL) 325 MG tablet Take by mouth.    . ALPRAZolam (XANAX) 0.25 MG tablet Take 1 tablet by mouth twice daily 60 tablet 0  . amLODipine (NORVASC) 5 MG tablet TAKE 1 TABLET BY MOUTH ONCE DAILY . APPOINTMENT REQUIRED FOR FUTURE REFILLS 30 tablet 0  . aspirin 81 MG tablet Take 162 mg by mouth daily.     . benzonatate (TESSALON) 200 MG capsule Take 200 mg by mouth 3 (three) times daily.    Marland Kitchen BREO ELLIPTA 100-25 MCG/INH AEPB INHALE 1 PUFF BY MOUTH ONCE DAILY    . clopidogrel (PLAVIX) 75 MG tablet Take 1 tablet by mouth once daily 90 tablet 0    . cyclobenzaprine (FLEXERIL) 10 MG tablet TAKE 1 TABLET BY MOUTH THREE TIMES DAILY AS NEEDED FOR BACK SPASMS 90 tablet 0  . DULoxetine (CYMBALTA) 60 MG capsule Take 60 mg by mouth 2 (two) times daily.     Marland Kitchen gabapentin (NEURONTIN) 300 MG capsule TAKE 1 CAPSULE BY MOUTH THREE TIMES DAILY 270 capsule 0  . Guaifenesin (MUCINEX MAXIMUM STRENGTH) 1200 MG TB12 Take 1 tablet (1,200 mg total) by mouth 2 (two) times daily. 20 tablet 0  . ipratropium-albuterol (DUONEB) 0.5-2.5 (3) MG/3ML SOLN Take 3 mLs by nebulization every 6 (six) hours as needed (for shortness of breath/wheezing. (with bronchitis)).    . Lancets (ONETOUCH DELICA PLUS 123XX123) MISC USE 1 UNIT TO CHECK GLUCOSE THREE TIMES DAILY    . metFORMIN (GLUCOPHAGE) 500 MG tablet Take 1 tablet by mouth twice daily 180 tablet 0  . metoprolol tartrate (LOPRESSOR) 50 MG tablet Take 1 tablet by mouth twice daily 180 tablet 0  . nitroGLYCERIN (NITROSTAT) 0.4 MG SL tablet Place 0.4 mg under the tongue every 6 (six) hours.    Marland Kitchen nystatin cream (MYCOSTATIN) MIX EQUAL PARTS OF NYSTATIN CREAM AND TRIAMCINOLONE CREAM AND APPLY TO AFFECTED AREA TWICE DAILY FOR 2 WEEKS    . omeprazole (PRILOSEC) 20 MG capsule Take 1 capsule by mouth once daily 90 capsule 0  . ONETOUCH ULTRA test strip USE 1 STRIP TO CHECK GLUCOSE TWICE DAILY    . rosuvastatin (CRESTOR) 20 MG tablet TAKE 1 TABLET BY MOUTH AT NIGHT 90 tablet 0  . SPIRIVA RESPIMAT 2.5 MCG/ACT AERS Inhale 2 puffs into the lungs daily.     . tamsulosin (FLOMAX) 0.4 MG CAPS capsule TAKE 1 CAPSULE BY MOUTH ONCE DAILY AT BEDTIME    . TOUJEO SOLOSTAR 300 UNIT/ML SOPN 30 Units daily.   0  . traMADol (ULTRAM) 50 MG  tablet TAKE 1 TABLET BY MOUTH EVERY 6 HOURS AS NEEDED FOR BACK PAIN 20 tablet 0  . traZODone (DESYREL) 150 MG tablet Take 150 mg by mouth at bedtime.    . VENTOLIN HFA 108 (90 Base) MCG/ACT inhaler prn  0  . levofloxacin (LEVAQUIN) 500 MG tablet Take 1 tablet (500 mg total) by mouth daily. 10 tablet 0  .  predniSONE (DELTASONE) 10 MG tablet Take 4 tabs po daily x 2 days; then 3 tabs for 2 days; then 2 tabs for 2 days; then 1 tab for 2 days 20 tablet 0  . promethazine-codeine (PHENERGAN WITH CODEINE) 6.25-10 MG/5ML syrup Take 5 mLs by mouth every 6 (six) hours as needed for cough. 140 mL 0   No facility-administered medications prior to visit.    Allergies  Allergen Reactions  . Penicillins Rash and Hives    Has patient had a PCN reaction causing immediate rash, facial/tongue/throat swelling, SOB or lightheadedness with hypotension:unsure Has patient had a PCN reaction causing severe rash involving mucus membranes or skin necrosis:unsure Has patient had a PCN reaction that required hospitalization:No Has patient had a PCN reaction occurring within the last 10 years:No If all of the above answers are "NO", then may proceed with Cephalosporin use.  rash  . Prednisone     Depressed mood - can tolerate it, but it makes him very irritable  . Doxycycline Rash  . Iodinated Diagnostic Agents Rash    Also developed blisters Also developed blisters    ROS Review of Systems  Constitutional: Negative for chills, diaphoresis, fatigue and fever.  HENT: Negative for congestion, ear pain and sore throat.   Respiratory: Negative for cough and shortness of breath.   Cardiovascular: Positive for leg swelling. Negative for chest pain.  Gastrointestinal: Negative for abdominal pain, constipation, diarrhea, nausea and vomiting.  Genitourinary: Negative for dysuria and urgency.  Musculoskeletal: Positive for arthralgias (Bilateral hips and shoulders. No radiculopathy. Tramadol does not help. Steroids given for his back which did not help his other joints. ). Negative for myalgias.  Neurological: Negative for dizziness and headaches.  Psychiatric/Behavioral: Positive for sleep disturbance (Unable to sleep due to pain in "bones"). Negative for dysphoric mood.      Objective:    Physical Exam    Constitutional: He appears well-developed and well-nourished.  Cardiovascular: Normal rate, regular rhythm and normal heart sounds.  No murmur heard. Pulmonary/Chest: Effort normal and breath sounds normal. No respiratory distress.  Abdominal: Soft. Bowel sounds are normal. He exhibits no distension. There is no abdominal tenderness.  Musculoskeletal:        General: No tenderness. Normal range of motion.     Comments: Discomfort with abduction her shoulders.  Discomfort with hip flexion.  Nontender to palpation  Neurological: He is alert.  Psychiatric: He has a normal mood and affect. His behavior is normal.    BP 138/74   Pulse 86   Temp (!) 96 F (35.6 C)   Ht 5\' 10"  (1.778 m)   Wt 202 lb (91.6 kg)   SpO2 98%   BMI 28.98 kg/m  Wt Readings from Last 3 Encounters:  03/08/20 202 lb (91.6 kg)  09/24/19 200 lb 3.2 oz (90.8 kg)  05/09/19 190 lb (86.2 kg)     Health Maintenance Due  Topic Date Due  . Hepatitis C Screening  Never done  . URINE MICROALBUMIN  Never done  . COVID-19 Vaccine (1) Never done  . TETANUS/TDAP  Never done  . COLONOSCOPY  10/27/2007  . PNA vac Low Risk Adult (1 of 2 - PCV13) Never done    There are no preventive care reminders to display for this patient.    Assessment & Plan:  1. Essential hypertension Well controlled.  No changes to medicines.  Continue to work on eating a healthy diet and exercise.  Labs to be drawn tomorrow.   2. Dyslipidemia Well controlled.  No changes to medicines.  Continue to work on eating a healthy diet and exercise.  Labs to be drawn tomorrow.   3. Combined hyperlipidemia associated with type 2 diabetes mellitus ([place]) Well controlled.  Check sugars daily in am. No changes to medicines.  Continue to work on eating a healthy diet and exercise.  Labs to be drawn tomorrow.   4. Myalgia (shoulder girdle and pelvic girdle) Rx for hydrocodone/apap  - Magnesium; Future - Sedimentation Rate; Future - TSH;  Future  5. Bone pain - Protein electrophoresis, serum; Future  6. Memory loss  - B12 and folate levels, future  Follow-up: No follow-ups on file.    [name], MD

## 2020-03-08 ENCOUNTER — Other Ambulatory Visit: Payer: Self-pay

## 2020-03-08 ENCOUNTER — Encounter: Payer: Self-pay | Admitting: Family Medicine

## 2020-03-08 ENCOUNTER — Ambulatory Visit (INDEPENDENT_AMBULATORY_CARE_PROVIDER_SITE_OTHER): Payer: Medicare Other | Admitting: Family Medicine

## 2020-03-08 VITALS — BP 138/74 | HR 86 | Temp 96.0°F | Ht 70.0 in | Wt 202.0 lb

## 2020-03-08 DIAGNOSIS — R413 Other amnesia: Secondary | ICD-10-CM

## 2020-03-08 DIAGNOSIS — E119 Type 2 diabetes mellitus without complications: Secondary | ICD-10-CM | POA: Insufficient documentation

## 2020-03-08 DIAGNOSIS — I10 Essential (primary) hypertension: Secondary | ICD-10-CM | POA: Diagnosis not present

## 2020-03-08 DIAGNOSIS — E1169 Type 2 diabetes mellitus with other specified complication: Secondary | ICD-10-CM

## 2020-03-08 DIAGNOSIS — E785 Hyperlipidemia, unspecified: Secondary | ICD-10-CM

## 2020-03-08 DIAGNOSIS — M791 Myalgia, unspecified site: Secondary | ICD-10-CM | POA: Diagnosis not present

## 2020-03-08 DIAGNOSIS — E782 Mixed hyperlipidemia: Secondary | ICD-10-CM

## 2020-03-08 DIAGNOSIS — M898X9 Other specified disorders of bone, unspecified site: Secondary | ICD-10-CM

## 2020-03-08 HISTORY — DX: Other amnesia: R41.3

## 2020-03-08 HISTORY — DX: Other specified disorders of bone, unspecified site: M89.8X9

## 2020-03-08 HISTORY — DX: Type 2 diabetes mellitus without complications: E11.9

## 2020-03-08 HISTORY — DX: Myalgia, unspecified site: M79.10

## 2020-03-08 MED ORDER — HYDROCODONE-ACETAMINOPHEN 5-325 MG PO TABS: 1.0000 | ORAL_TABLET | Freq: Four times a day (QID) | ORAL | 0 refills | Status: DC | PRN

## 2020-03-08 NOTE — Patient Instructions (Signed)
1. Essential hypertension 2. Dyslipidemia Well controlled.  No changes to medicines.  Continue to work on eating a healthy diet and exercise.  Labs to be drawn tomorrow.   3. Combined hyperlipidemia associated with type 2 diabetes mellitus ([place]) Well controlled.  Check sugars daily in am. No changes to medicines.  Continue to work on eating a healthy diet and exercise.  Labs to be drawn tomorrow.   4. Myalgia (shoulder girdle and pelvic girdle) - Rule out PMR. Rx for hydrocodone/apap  - Magnesium; Future - Sedimentation Rate; Future - TSH; Future  5. Bone pain - Rule out Multiple myeloma. - Protein electrophoresis, serum

## 2020-03-09 ENCOUNTER — Other Ambulatory Visit: Payer: Medicare Other

## 2020-03-09 DIAGNOSIS — R0902 Hypoxemia: Secondary | ICD-10-CM | POA: Diagnosis not present

## 2020-03-11 DIAGNOSIS — J441 Chronic obstructive pulmonary disease with (acute) exacerbation: Secondary | ICD-10-CM | POA: Diagnosis not present

## 2020-03-18 ENCOUNTER — Other Ambulatory Visit: Payer: Self-pay

## 2020-03-18 ENCOUNTER — Other Ambulatory Visit: Payer: Medicare Other

## 2020-03-18 DIAGNOSIS — M898X9 Other specified disorders of bone, unspecified site: Secondary | ICD-10-CM | POA: Diagnosis not present

## 2020-03-18 DIAGNOSIS — M791 Myalgia, unspecified site: Secondary | ICD-10-CM | POA: Diagnosis not present

## 2020-03-19 LAB — TSH: TSH: 2.06 u[IU]/mL (ref 0.450–4.500)

## 2020-03-19 LAB — SEDIMENTATION RATE: Sed Rate: 23 mm/hr (ref 0–30)

## 2020-03-19 LAB — MAGNESIUM: Magnesium: 1.7 mg/dL (ref 1.6–2.3)

## 2020-03-22 ENCOUNTER — Other Ambulatory Visit: Payer: Self-pay | Admitting: Family Medicine

## 2020-03-22 ENCOUNTER — Other Ambulatory Visit: Payer: Self-pay

## 2020-03-22 LAB — PROTEIN ELECTROPHORESIS, SERUM
A/G Ratio: 1 (ref 0.7–1.7)
Albumin ELP: 3.2 g/dL (ref 2.9–4.4)
Alpha 1: 0.2 g/dL (ref 0.0–0.4)
Alpha 2: 0.8 g/dL (ref 0.4–1.0)
Beta: 1.2 g/dL (ref 0.7–1.3)
Gamma Globulin: 0.9 g/dL (ref 0.4–1.8)
Globulin, Total: 3.1 g/dL (ref 2.2–3.9)
Total Protein: 6.3 g/dL (ref 6.0–8.5)

## 2020-03-23 ENCOUNTER — Telehealth: Payer: Self-pay

## 2020-03-23 MED ORDER — HYDROCODONE-ACETAMINOPHEN 5-325 MG PO TABS: 1.0000 | ORAL_TABLET | Freq: Four times a day (QID) | ORAL | 0 refills | Status: DC | PRN

## 2020-03-23 NOTE — Telephone Encounter (Signed)
Left message for patient to call office for lab results.

## 2020-03-24 ENCOUNTER — Other Ambulatory Visit: Payer: Self-pay | Admitting: Family Medicine

## 2020-03-24 ENCOUNTER — Telehealth: Payer: Self-pay

## 2020-03-24 MED ORDER — GABAPENTIN 400 MG PO CAPS: 400.0000 mg | ORAL_CAPSULE | Freq: Three times a day (TID) | ORAL | 2 refills | Status: DC

## 2020-03-24 NOTE — Telephone Encounter (Signed)
Don [name] returned Dr. Alyse Low call.  Ottavio is willing to change to Gabapentin 400 mg

## 2020-03-30 ENCOUNTER — Other Ambulatory Visit: Payer: Self-pay | Admitting: Family Medicine

## 2020-03-30 DIAGNOSIS — K5730 Diverticulosis of large intestine without perforation or abscess without bleeding: Secondary | ICD-10-CM | POA: Diagnosis not present

## 2020-03-30 DIAGNOSIS — K5792 Diverticulitis of intestine, part unspecified, without perforation or abscess without bleeding: Secondary | ICD-10-CM | POA: Diagnosis not present

## 2020-03-30 NOTE — Progress Notes (Signed)
Cancelled. kc 

## 2020-04-01 ENCOUNTER — Other Ambulatory Visit: Payer: Self-pay | Admitting: Family Medicine

## 2020-04-02 ENCOUNTER — Ambulatory Visit (INDEPENDENT_AMBULATORY_CARE_PROVIDER_SITE_OTHER): Payer: Medicare Other | Admitting: Family Medicine

## 2020-04-02 DIAGNOSIS — R413 Other amnesia: Secondary | ICD-10-CM

## 2020-04-07 ENCOUNTER — Other Ambulatory Visit: Payer: Self-pay

## 2020-04-07 MED ORDER — TOUJEO SOLOSTAR 300 UNIT/ML ~~LOC~~ SOPN: 30.0000 [IU] | PEN_INJECTOR | Freq: Every day | SUBCUTANEOUS | 0 refills | Status: DC

## 2020-04-08 ENCOUNTER — Other Ambulatory Visit: Payer: Self-pay

## 2020-04-08 DIAGNOSIS — R0902 Hypoxemia: Secondary | ICD-10-CM | POA: Diagnosis not present

## 2020-04-09 ENCOUNTER — Other Ambulatory Visit: Payer: Self-pay | Admitting: Family Medicine

## 2020-04-10 DIAGNOSIS — J441 Chronic obstructive pulmonary disease with (acute) exacerbation: Secondary | ICD-10-CM | POA: Diagnosis not present

## 2020-04-13 ENCOUNTER — Other Ambulatory Visit: Payer: Self-pay

## 2020-04-13 MED ORDER — HYDROCODONE-ACETAMINOPHEN 5-325 MG PO TABS: 1.0000 | ORAL_TABLET | Freq: Four times a day (QID) | ORAL | 0 refills | Status: DC | PRN

## 2020-04-13 MED ORDER — CYCLOBENZAPRINE HCL 10 MG PO TABS: ORAL_TABLET | ORAL | 0 refills | Status: DC

## 2020-04-14 ENCOUNTER — Other Ambulatory Visit: Payer: Self-pay | Admitting: Family Medicine

## 2020-04-14 MED ORDER — CYCLOBENZAPRINE HCL 10 MG PO TABS: ORAL_TABLET | ORAL | 2 refills | Status: DC

## 2020-04-23 ENCOUNTER — Encounter: Payer: Self-pay | Admitting: Family Medicine

## 2020-04-23 DIAGNOSIS — E538 Deficiency of other specified B group vitamins: Secondary | ICD-10-CM

## 2020-04-23 DIAGNOSIS — E611 Iron deficiency: Secondary | ICD-10-CM

## 2020-04-23 DIAGNOSIS — M25511 Pain in right shoulder: Secondary | ICD-10-CM

## 2020-04-23 DIAGNOSIS — F17210 Nicotine dependence, cigarettes, uncomplicated: Secondary | ICD-10-CM

## 2020-04-23 DIAGNOSIS — M79601 Pain in right arm: Secondary | ICD-10-CM

## 2020-04-23 DIAGNOSIS — Z8521 Personal history of malignant neoplasm of larynx: Secondary | ICD-10-CM

## 2020-04-23 DIAGNOSIS — D649 Anemia, unspecified: Secondary | ICD-10-CM

## 2020-04-23 DIAGNOSIS — M898X8 Other specified disorders of bone, other site: Secondary | ICD-10-CM

## 2020-04-23 DIAGNOSIS — Z85820 Personal history of malignant melanoma of skin: Secondary | ICD-10-CM

## 2020-04-23 DIAGNOSIS — R918 Other nonspecific abnormal finding of lung field: Secondary | ICD-10-CM

## 2020-04-27 ENCOUNTER — Other Ambulatory Visit: Payer: Self-pay

## 2020-04-27 ENCOUNTER — Ambulatory Visit: Payer: Medicare Other | Admitting: Family Medicine

## 2020-04-27 VITALS — BP 124/70 | HR 72 | Temp 97.1°F | Ht 70.0 in | Wt 200.8 lb

## 2020-04-27 DIAGNOSIS — K5792 Diverticulitis of intestine, part unspecified, without perforation or abscess without bleeding: Secondary | ICD-10-CM

## 2020-04-27 DIAGNOSIS — M7989 Other specified soft tissue disorders: Secondary | ICD-10-CM | POA: Diagnosis not present

## 2020-04-27 DIAGNOSIS — D649 Anemia, unspecified: Secondary | ICD-10-CM | POA: Diagnosis not present

## 2020-04-27 DIAGNOSIS — R600 Localized edema: Secondary | ICD-10-CM | POA: Diagnosis not present

## 2020-04-27 DIAGNOSIS — M79621 Pain in right upper arm: Secondary | ICD-10-CM | POA: Diagnosis not present

## 2020-04-27 DIAGNOSIS — M898X9 Other specified disorders of bone, unspecified site: Secondary | ICD-10-CM

## 2020-04-27 MED ORDER — OMEPRAZOLE 20 MG PO CPDR: 20.0000 mg | DELAYED_RELEASE_CAPSULE | Freq: Every day | ORAL | 3 refills | Status: DC

## 2020-04-27 MED ORDER — HYDROCODONE-ACETAMINOPHEN 5-325 MG PO TABS: 1.0000 | ORAL_TABLET | Freq: Four times a day (QID) | ORAL | 0 refills | Status: DC | PRN

## 2020-04-27 MED ORDER — ALPRAZOLAM 0.25 MG PO TABS: ORAL_TABLET | ORAL | 2 refills | Status: DC

## 2020-04-27 NOTE — Progress Notes (Signed)
Subjective:  Patient ID: Don [name], male    DOB: 1951-09-20  Age: 69 y.o. MRN: [id number]  Chief Complaint  Patient presents with  . Hospitalization Follow-up  . Abdominal Pain    HPI  Patient presents for hospital follow up to [facility] on 03/30/2020. Presented with abdominal pain complaining of aching, throbbing, intermittent lower abdominal pain since Saturday. Pain was severe he reported taking Hydrocodone which helped some, Continued to have intermittent episodes of severe pain, diarrhea described as loose and watery stool, some nausea with pain but no vomiting. CT of Abdomen and Pelvis was consistent with sigmoid colon diverticulitis. Treated with Flagyl 500 BID (#14), Levaquin 500 1 daily (#7) and Oxycodone 5 mg every 6 hours p.r.n (#20). He has completed antibiotics. He is feeling much better.   Current Outpatient Medications on File Prior to Visit  Medication Sig Dispense Refill  . acetaminophen (TYLENOL) 325 MG tablet Take by mouth.    Marland Kitchen amLODipine (NORVASC) 5 MG tablet TAKE 1 TABLET BY MOUTH ONCE DAILY . APPOINTMENT REQUIRED FOR FUTURE REFILLS 30 tablet 0  . aspirin 81 MG tablet Take 162 mg by mouth daily.     Marland Kitchen BREO ELLIPTA 100-25 MCG/INH AEPB INHALE 1 PUFF BY MOUTH ONCE DAILY    . clopidogrel (PLAVIX) 75 MG tablet Take 1 tablet by mouth once daily 90 tablet 0  . cyclobenzaprine (FLEXERIL) 10 MG tablet TAKE 1 TABLET BY MOUTH THREE TIMES DAILY AS NEEDED FOR  BACK  SPASMS 90 tablet 2  . DULoxetine (CYMBALTA) 60 MG capsule Take 1 capsule by mouth twice daily 180 capsule 0  . gabapentin (NEURONTIN) 400 MG capsule Take 1 capsule (400 mg total) by mouth 3 (three) times daily. 90 capsule 2  . Guaifenesin (MUCINEX MAXIMUM STRENGTH) 1200 MG TB12 Take 1 tablet (1,200 mg total) by mouth 2 (two) times daily. 20 tablet 0  . ipratropium-albuterol (DUONEB) 0.5-2.5 (3) MG/3ML SOLN Take 3 mLs by nebulization every 6 (six) hours as needed (for shortness of breath/wheezing. (with bronchitis)).     . Lancets (ONETOUCH DELICA PLUS [id number]) MISC USE 1 UNIT TO CHECK GLUCOSE THREE TIMES DAILY    . metFORMIN (GLUCOPHAGE) 500 MG tablet Take 1 tablet by mouth twice daily 180 tablet 0  . metoprolol tartrate (LOPRESSOR) 50 MG tablet Take 1 tablet by mouth twice daily 180 tablet 0  . nitroGLYCERIN (NITROSTAT) 0.4 MG SL tablet Place 0.4 mg under the tongue every 6 (six) hours.    Marland Kitchen nystatin cream (MYCOSTATIN) MIX EQUAL PARTS OF NYSTATIN CREAM AND TRIAMCINOLONE CREAM AND APPLY TO AFFECTED AREA TWICE DAILY FOR 2 WEEKS    . ONETOUCH ULTRA test strip USE 1 STRIP TO CHECK GLUCOSE TWICE DAILY    . rosuvastatin (CRESTOR) 20 MG tablet TAKE 1 TABLET BY MOUTH AT NIGHT 90 tablet 0  . SPIRIVA RESPIMAT 2.5 MCG/ACT AERS Inhale 2 puffs into the lungs daily.     . tamsulosin (FLOMAX) 0.4 MG CAPS capsule TAKE 1 CAPSULE BY MOUTH ONCE DAILY AT BEDTIME    . TOUJEO SOLOSTAR 300 UNIT/ML Solostar Pen Inject 30 Units into the skin daily. 6 pen 0  . traMADol (ULTRAM) 50 MG tablet TAKE 1 TABLET BY MOUTH EVERY 6 HOURS AS NEEDED FOR BACK PAIN 20 tablet 0  . traZODone (DESYREL) 150 MG tablet Take 150 mg by mouth at bedtime.    . VENTOLIN HFA 108 (90 Base) MCG/ACT inhaler prn  0   No current facility-administered medications on file prior to visit.  Past Medical History:  Diagnosis Date  . Arthritis   . Asymptomatic LV dysfunction 10/18/2017  . Basal cell carcinoma   . Benign prostatic hyperplasia with incomplete bladder emptying 01/14/2019  . Cancer ([place])    throat - 1997, throat - 2018  . Cardiomyopathy, secondary ([place]) 12/01/2016   Overview:  EF 47% 12/26/16  . Chest pain syndrome 11/02/2017  . Chronic coronary artery disease 10/18/2017  . CKD (chronic kidney disease) stage 3, GFR 30-59 ml/min 07/25/2019  . COPD (chronic obstructive pulmonary disease) ([place])   . Coronary artery disease   . Coronary artery disease involving native coronary artery of native heart with angina pectoris ([place]) 12/01/2016   Overview:  He has  hx of IWMI in remote past, last cath in 2012 at [facility] showed chronic total occlusion of previously stented RCA, with good collaterals, a 40% LAD stenosis, inferior hypokinesis and EF 45%.    He's been lost to Cardiology f/u since 2015, but has not had any recurrent events  . Depression   . Dyslipidemia 12/01/2016  . Emphysema lung ([place])   . Erectile dysfunction due to diseases classified elsewhere 06/12/2019  . Essential hypertension 10/18/2017  . GERD (gastroesophageal reflux disease)   . Hoarseness 11/15/2016  . Hyperlipidemia   . Hypertension   . Lesion of vocal cord   . Leukoplakia of larynx 11/15/2016  . Malfunction of penile prosthesis ([place]) 01/14/2019  . Melanoma of back ([place])    melanoma on back  . Myocardial infarction ([place]) 2000   2 stents  . PAD (peripheral artery disease) ([place]) 10/18/2017  . Peripheral vascular disease (HCC)    iliac artery clot  . Peyronie's disease 06/12/2019  . Pharyngoesophageal dysphagia 08/12/2018  . Pneumonia 02/2015  . Preoperative clearance 04/03/2018  . Squamous cell carcinoma of larynx ([place]) 12/13/2016  . Wears glasses    Past Surgical History:  Procedure Laterality Date  . ABDOMINAL ANGIOGRAM N/A 01/13/2015   Procedure: ABDOMINAL ANGIOGRAM;  Surgeon: Rosetta Posner, MD;  Location: [facility] CATH LAB;  Service: Cardiovascular;  Laterality: N/A;  . APPENDECTOMY    . BACK SURGERY    . CARDIAC CATHETERIZATION  2000/2012   with stents in 2000  . CHOLECYSTECTOMY    . COLONOSCOPY    . ELBOW SURGERY     bilateral  . ESOPHAGOGASTRODUODENOSCOPY    . KNEE SURGERY Left   . MICROLARYNGOSCOPY WITH CO2 LASER AND EXCISION OF VOCAL CORD LESION N/A 11/23/2016   Procedure: MICROLARYNGOSCOPY  AND EXCISION OF VOCAL CORD LESION;  Surgeon: Melissa Montane, MD;  Location: [place];  Service: ENT;  Laterality: N/A;  . MICROLARYNGOSCOPY WITH CO2 LASER AND EXCISION OF VOCAL CORD LESION N/A 01/11/2017   Procedure: MICROLARYNGOSCOPY WITH CO2 LASER AND EXCISION OF VOCAL CORD LESION;  Surgeon: Melissa Montane, MD;  Location: [place];  Service: ENT;  Laterality: N/A;  . MICROLARYNGOSCOPY WITH LASER N/A 03/16/2015   Procedure: MICROLARYNGOSCOPY ;  Surgeon: Melissa Montane, MD;  Location: [place];  Service: ENT;  Laterality: N/A;  . peyronie's surgery    . SHOULDER SURGERY Right    rotator cuff  . THROAT SURGERY  1997   cancer removed    Family History  Problem Relation Age of Onset  . Cancer Mother        bone  . Stroke Father   . Healthy Child   . Cancer Other        brain  . Diabetes Other    Social History   Socioeconomic History  .  Marital status: Married    Spouse name: Not on file  . Number of children: 4  . Years of education: Not on file  . Highest education level: GED or equivalent  Occupational History  . Occupation: retired  Tobacco Use  . Smoking status: Current Every Day Smoker    Packs/day: 1.00    Years: 50.00    Pack years: 50.00    Types: Cigarettes  . Smokeless tobacco: Never Used  . Tobacco comment: down to .5 ppd  Vaping Use  . Vaping Use: Former  Substance and Sexual Activity  . Alcohol use: No    Alcohol/week: 0.0 standard drinks  . Drug use: No  . Sexual activity: Not on file  Other Topics Concern  . Not on file  Social History Narrative   Lives with wife       One level      Right hand   Social Determinants of Health   Financial Resource Strain:   . Difficulty of Paying Living Expenses:   Food Insecurity:   . Worried About Charity fundraiser in the Last Year:   . Arboriculturist in the Last Year:   Transportation Needs:   . Film/video editor (Medical):   Marland Kitchen Lack of Transportation (Non-Medical):   Physical Activity:   . Days of Exercise per Week:   . Minutes of Exercise per Session:   Stress:   . Feeling of Stress :   Social Connections:   . Frequency of Communication with Friends and Family:   . Frequency of Social Gatherings with Friends and Family:   . Attends Religious Services:   . Active Member of Clubs or Organizations:     . Attends Archivist Meetings:   Marland Kitchen Marital Status:     Review of Systems  Constitutional: Negative for chills, diaphoresis, fatigue and fever.  HENT: Negative for congestion, ear pain and sore throat.   Respiratory: Negative for cough and shortness of breath.   Cardiovascular: Negative for chest pain and leg swelling.  Gastrointestinal: Negative for abdominal pain, constipation, diarrhea, nausea and vomiting.  Genitourinary: Negative for dysuria and urgency.  Musculoskeletal: Negative for arthralgias and myalgias.  Neurological: Negative for dizziness and headaches.  Psychiatric/Behavioral: Negative for dysphoric mood.     Objective:  BP 124/70   Pulse 72   Temp (!) 97.1 F (36.2 C)   Ht 5\' 10"  (1.778 m)   Wt 200 lb 12.8 oz (91.1 kg)   BMI 28.81 kg/m   BP/Weight 04/27/2020 03/08/2020 24/07/7352  Systolic BP 299 242 -  Diastolic BP 70 74 -  Wt. (Lbs) 200.8 202 200.2  BMI 28.81 28.98 28.73    Physical Exam Vitals reviewed.  Constitutional:      Appearance: Normal appearance. He is normal weight.  Cardiovascular:     Rate and Rhythm: Normal rate and regular rhythm.  Pulmonary:     Effort: Pulmonary effort is normal.     Breath sounds: Normal breath sounds.  Abdominal:     General: Abdomen is flat. Bowel sounds are normal.     Palpations: Abdomen is soft.  Neurological:     Mental Status: He is alert and oriented to person, place, and time.  Psychiatric:        Mood and Affect: Mood normal.        Behavior: Behavior normal.     Diabetic Foot Exam - Simple   No data filed       Lab  Results  Component Value Date   WBC 12.2 (H) 01/10/2017   HGB 15.0 01/10/2017   HCT 43.4 01/10/2017   PLT 195 01/10/2017   GLUCOSE 125 (H) 01/10/2017   ALT 20 05/29/2007   AST 21 05/29/2007   NA 140 01/10/2017   K 4.2 01/10/2017   CL 106 01/10/2017   CREATININE 0.98 01/10/2017   BUN 16 01/10/2017   CO2 27 01/10/2017   TSH 2.060 03/18/2020   INR 1.05 03/16/2015       Assessment & Plan:   1. Diverticulitis Resolved.  2. Bone pain Patient has seen Dr. Hinton Rao, who has ordered a PET scan, CT of neck. Pt has had melanoma and laryngeal cancers in the past   Meds ordered this encounter  Medications  . HYDROcodone-acetaminophen (NORCO/VICODIN) 5-325 MG tablet    Sig: Take 1 tablet by mouth every 6 (six) hours as needed for moderate pain.    Dispense:  120 tablet    Refill:  0  . ALPRAZolam (XANAX) 0.25 MG tablet    Sig: Once daily as needed for anxiety.    Dispense:  30 tablet    Refill:  2  . omeprazole (PRILOSEC) 20 MG capsule    Sig: Take 1 capsule (20 mg total) by mouth daily.    Dispense:  90 capsule    Refill:  3    No orders of the defined types were placed in this encounter.    Follow-up: Return in about 2 months (around 06/27/2020) for chronic follow up. .  An After Visit Summary was printed and given to the patient.  [name] [facility] [phone]

## 2020-04-30 DIAGNOSIS — R229 Localized swelling, mass and lump, unspecified: Secondary | ICD-10-CM | POA: Diagnosis not present

## 2020-04-30 DIAGNOSIS — R42 Dizziness and giddiness: Secondary | ICD-10-CM | POA: Diagnosis not present

## 2020-04-30 DIAGNOSIS — Z8521 Personal history of malignant neoplasm of larynx: Secondary | ICD-10-CM | POA: Diagnosis not present

## 2020-04-30 DIAGNOSIS — D649 Anemia, unspecified: Secondary | ICD-10-CM | POA: Diagnosis not present

## 2020-04-30 DIAGNOSIS — M19011 Primary osteoarthritis, right shoulder: Secondary | ICD-10-CM | POA: Diagnosis not present

## 2020-04-30 DIAGNOSIS — R911 Solitary pulmonary nodule: Secondary | ICD-10-CM | POA: Diagnosis not present

## 2020-04-30 DIAGNOSIS — M8589 Other specified disorders of bone density and structure, multiple sites: Secondary | ICD-10-CM | POA: Diagnosis not present

## 2020-05-02 ENCOUNTER — Encounter: Payer: Self-pay | Admitting: Family Medicine

## 2020-05-02 DIAGNOSIS — K5792 Diverticulitis of intestine, part unspecified, without perforation or abscess without bleeding: Secondary | ICD-10-CM | POA: Insufficient documentation

## 2020-05-02 HISTORY — DX: Diverticulitis of intestine, part unspecified, without perforation or abscess without bleeding: K57.92

## 2020-05-09 DIAGNOSIS — R0902 Hypoxemia: Secondary | ICD-10-CM | POA: Diagnosis not present

## 2020-05-11 DIAGNOSIS — Z85820 Personal history of malignant melanoma of skin: Secondary | ICD-10-CM | POA: Diagnosis not present

## 2020-05-11 DIAGNOSIS — L821 Other seborrheic keratosis: Secondary | ICD-10-CM | POA: Diagnosis not present

## 2020-05-11 DIAGNOSIS — J441 Chronic obstructive pulmonary disease with (acute) exacerbation: Secondary | ICD-10-CM | POA: Diagnosis not present

## 2020-05-11 DIAGNOSIS — D485 Neoplasm of uncertain behavior of skin: Secondary | ICD-10-CM | POA: Diagnosis not present

## 2020-05-11 DIAGNOSIS — L219 Seborrheic dermatitis, unspecified: Secondary | ICD-10-CM | POA: Diagnosis not present

## 2020-05-16 ENCOUNTER — Other Ambulatory Visit: Payer: Self-pay | Admitting: Family Medicine

## 2020-05-24 ENCOUNTER — Other Ambulatory Visit: Payer: Self-pay

## 2020-05-25 ENCOUNTER — Other Ambulatory Visit: Payer: Self-pay | Admitting: Family Medicine

## 2020-05-27 DIAGNOSIS — R1310 Dysphagia, unspecified: Secondary | ICD-10-CM | POA: Diagnosis not present

## 2020-05-27 DIAGNOSIS — Z1211 Encounter for screening for malignant neoplasm of colon: Secondary | ICD-10-CM | POA: Diagnosis not present

## 2020-05-28 ENCOUNTER — Other Ambulatory Visit: Payer: Self-pay | Admitting: Family Medicine

## 2020-05-28 ENCOUNTER — Other Ambulatory Visit: Payer: Self-pay

## 2020-05-28 DIAGNOSIS — R1314 Dysphagia, pharyngoesophageal phase: Secondary | ICD-10-CM | POA: Diagnosis not present

## 2020-05-28 DIAGNOSIS — K219 Gastro-esophageal reflux disease without esophagitis: Secondary | ICD-10-CM | POA: Diagnosis not present

## 2020-05-28 DIAGNOSIS — H938X3 Other specified disorders of ear, bilateral: Secondary | ICD-10-CM | POA: Diagnosis not present

## 2020-05-28 DIAGNOSIS — L299 Pruritus, unspecified: Secondary | ICD-10-CM | POA: Diagnosis not present

## 2020-05-28 MED ORDER — METOPROLOL TARTRATE 50 MG PO TABS: 50.0000 mg | ORAL_TABLET | Freq: Two times a day (BID) | ORAL | 2 refills | Status: DC

## 2020-05-31 ENCOUNTER — Other Ambulatory Visit: Payer: Self-pay

## 2020-05-31 MED ORDER — HYDROCODONE-ACETAMINOPHEN 5-325 MG PO TABS: 1.0000 | ORAL_TABLET | Freq: Four times a day (QID) | ORAL | 0 refills | Status: DC | PRN

## 2020-06-04 DIAGNOSIS — M4802 Spinal stenosis, cervical region: Secondary | ICD-10-CM | POA: Diagnosis not present

## 2020-06-04 DIAGNOSIS — M5416 Radiculopathy, lumbar region: Secondary | ICD-10-CM | POA: Diagnosis not present

## 2020-06-07 DIAGNOSIS — Z880 Allergy status to penicillin: Secondary | ICD-10-CM | POA: Diagnosis not present

## 2020-06-07 DIAGNOSIS — J939 Pneumothorax, unspecified: Secondary | ICD-10-CM | POA: Insufficient documentation

## 2020-06-07 DIAGNOSIS — I129 Hypertensive chronic kidney disease with stage 1 through stage 4 chronic kidney disease, or unspecified chronic kidney disease: Secondary | ICD-10-CM | POA: Diagnosis not present

## 2020-06-07 DIAGNOSIS — J9311 Primary spontaneous pneumothorax: Secondary | ICD-10-CM | POA: Diagnosis not present

## 2020-06-07 DIAGNOSIS — K219 Gastro-esophageal reflux disease without esophagitis: Secondary | ICD-10-CM | POA: Diagnosis not present

## 2020-06-07 DIAGNOSIS — E782 Mixed hyperlipidemia: Secondary | ICD-10-CM | POA: Diagnosis not present

## 2020-06-07 DIAGNOSIS — J9811 Atelectasis: Secondary | ICD-10-CM | POA: Diagnosis not present

## 2020-06-07 DIAGNOSIS — R0781 Pleurodynia: Secondary | ICD-10-CM | POA: Diagnosis not present

## 2020-06-07 DIAGNOSIS — R0902 Hypoxemia: Secondary | ICD-10-CM | POA: Diagnosis not present

## 2020-06-07 DIAGNOSIS — F0390 Unspecified dementia without behavioral disturbance: Secondary | ICD-10-CM | POA: Diagnosis not present

## 2020-06-07 DIAGNOSIS — N183 Chronic kidney disease, stage 3 unspecified: Secondary | ICD-10-CM | POA: Diagnosis not present

## 2020-06-07 DIAGNOSIS — Z8249 Family history of ischemic heart disease and other diseases of the circulatory system: Secondary | ICD-10-CM | POA: Diagnosis not present

## 2020-06-07 DIAGNOSIS — R079 Chest pain, unspecified: Secondary | ICD-10-CM | POA: Diagnosis not present

## 2020-06-07 DIAGNOSIS — I252 Old myocardial infarction: Secondary | ICD-10-CM | POA: Diagnosis not present

## 2020-06-07 DIAGNOSIS — I2510 Atherosclerotic heart disease of native coronary artery without angina pectoris: Secondary | ICD-10-CM | POA: Diagnosis not present

## 2020-06-07 DIAGNOSIS — F17210 Nicotine dependence, cigarettes, uncomplicated: Secondary | ICD-10-CM | POA: Diagnosis not present

## 2020-06-07 DIAGNOSIS — J449 Chronic obstructive pulmonary disease, unspecified: Secondary | ICD-10-CM | POA: Diagnosis not present

## 2020-06-07 DIAGNOSIS — R0602 Shortness of breath: Secondary | ICD-10-CM | POA: Diagnosis not present

## 2020-06-07 DIAGNOSIS — E1122 Type 2 diabetes mellitus with diabetic chronic kidney disease: Secondary | ICD-10-CM | POA: Diagnosis not present

## 2020-06-07 DIAGNOSIS — Z823 Family history of stroke: Secondary | ICD-10-CM | POA: Diagnosis not present

## 2020-06-07 DIAGNOSIS — R05 Cough: Secondary | ICD-10-CM | POA: Diagnosis not present

## 2020-06-07 DIAGNOSIS — E1151 Type 2 diabetes mellitus with diabetic peripheral angiopathy without gangrene: Secondary | ICD-10-CM | POA: Diagnosis not present

## 2020-06-07 DIAGNOSIS — Z9049 Acquired absence of other specified parts of digestive tract: Secondary | ICD-10-CM | POA: Diagnosis not present

## 2020-06-07 HISTORY — DX: Pneumothorax, unspecified: J93.9

## 2020-06-10 ENCOUNTER — Other Ambulatory Visit: Payer: Self-pay | Admitting: Family Medicine

## 2020-06-10 DIAGNOSIS — J441 Chronic obstructive pulmonary disease with (acute) exacerbation: Secondary | ICD-10-CM | POA: Diagnosis not present

## 2020-06-29 ENCOUNTER — Other Ambulatory Visit: Payer: Self-pay | Admitting: Family Medicine

## 2020-06-29 ENCOUNTER — Other Ambulatory Visit: Payer: Self-pay | Admitting: Physician Assistant

## 2020-07-01 ENCOUNTER — Other Ambulatory Visit: Payer: Self-pay

## 2020-07-01 DIAGNOSIS — M4726 Other spondylosis with radiculopathy, lumbar region: Secondary | ICD-10-CM | POA: Diagnosis not present

## 2020-07-01 DIAGNOSIS — M5416 Radiculopathy, lumbar region: Secondary | ICD-10-CM | POA: Diagnosis not present

## 2020-07-01 DIAGNOSIS — M5117 Intervertebral disc disorders with radiculopathy, lumbosacral region: Secondary | ICD-10-CM | POA: Diagnosis not present

## 2020-07-01 DIAGNOSIS — M48061 Spinal stenosis, lumbar region without neurogenic claudication: Secondary | ICD-10-CM | POA: Diagnosis not present

## 2020-07-01 DIAGNOSIS — M4727 Other spondylosis with radiculopathy, lumbosacral region: Secondary | ICD-10-CM | POA: Diagnosis not present

## 2020-07-01 MED ORDER — HYDROCODONE-ACETAMINOPHEN 5-325 MG PO TABS: 1.0000 | ORAL_TABLET | Freq: Four times a day (QID) | ORAL | 0 refills | Status: DC | PRN

## 2020-07-09 DIAGNOSIS — R0902 Hypoxemia: Secondary | ICD-10-CM | POA: Diagnosis not present

## 2020-07-14 NOTE — Progress Notes (Signed)
Virtual Visit via Telephone Note   This visit type was conducted due to national recommendations for restrictions regarding the COVID-19 Pandemic (e.g. social distancing) in an effort to limit this patient's exposure and mitigate transmission in our community.  Due to his co-morbid illnesses, this patient is at least at moderate risk for complications without adequate follow up.  This format is felt to be most appropriate for this patient at this time.  The patient did not have access to video technology/had technical difficulties with video requiring transitioning to audio format only (telephone).  All issues noted in this document were discussed and addressed.  No physical exam could be performed with this format.  Patient verbally consented to a telehealth visit.   Date:  07/21/2020   ID:  [name], DOB [birth year], MRN [id number]  Patient Location: Home Provider Location: Office/Clinic  PCP:  [name], MD   Evaluation Performed: acute visit  Chief Complaint:  Headaches  History of Present Illness:    Don [name] is a 69 y.o. male complaining of headaches for 1 month. Come on suddenly. Starts in your neck then radiates to the back and the left side. Usually happen when he lays down. Headaches that start mid day and last all evening, hydrocodone (for back pain) does not help with the headaches. Has not tried any other medications due to current medications and not knowing what is safe to take.No alleviating symptoms. Hydrocodone/apap does not help. Sees ortho this afternoon.  Off balance and staggers. Legs start shaking. Feels weak. Fell 2 months ago.  [facility] - pneumothorax with chest tube in left lung inJuly 2021. Admitted for 5 days and then came home. Doing well.   The patient does not have symptoms concerning for COVID-19 infection (fever, chills, cough, or new shortness of breath).    Past Medical History:  Diagnosis Date  . Arthritis   . Asymptomatic LV  dysfunction 10/18/2017  . Basal cell carcinoma   . Benign prostatic hyperplasia with incomplete bladder emptying 01/14/2019  . Cancer ([place])    throat - 1997, throat - 2018  . Cardiomyopathy, secondary ([place]) 12/01/2016   Overview:  EF 47% 12/26/16  . Chest pain syndrome 11/02/2017  . Chronic coronary artery disease 10/18/2017  . CKD (chronic kidney disease) stage 3, GFR 30-59 ml/min 07/25/2019  . COPD (chronic obstructive pulmonary disease) ([place])   . Coronary artery disease   . Coronary artery disease involving native coronary artery of native heart with angina pectoris ([place]) 12/01/2016   Overview:  He has hx of IWMI in remote past, last cath in 2012 at [facility] showed chronic total occlusion of previously stented RCA, with good collaterals, a 40% LAD stenosis, inferior hypokinesis and EF 45%.    He's been lost to Cardiology f/u since 2015, but has not had any recurrent events  . Depression   . Dyslipidemia 12/01/2016  . Emphysema lung ([place])   . Erectile dysfunction due to diseases classified elsewhere 06/12/2019  . Essential hypertension 10/18/2017  . GERD (gastroesophageal reflux disease)   . Hoarseness 11/15/2016  . Hyperlipidemia   . Hypertension   . Lesion of vocal cord   . Leukoplakia of larynx 11/15/2016  . Malfunction of penile prosthesis ([place]) 01/14/2019  . Melanoma of back ([place])    melanoma on back  . Myocardial infarction ([place]) 2000   2 stents  . PAD (peripheral artery disease) ([place]) 10/18/2017  . Peripheral vascular disease (HCC)    iliac artery clot  .  Peyronie's disease 06/12/2019  . Pharyngoesophageal dysphagia 08/12/2018  . Pneumonia 02/2015  . Preoperative clearance 04/03/2018  . Squamous cell carcinoma of larynx ([place]) 12/13/2016  . Wears glasses     Past Surgical History:  Procedure Laterality Date  . ABDOMINAL ANGIOGRAM N/A 01/13/2015   Procedure: ABDOMINAL ANGIOGRAM;  Surgeon: Rosetta Posner, MD;  Location: [facility] CATH LAB;  Service: Cardiovascular;  Laterality: N/A;  . APPENDECTOMY    .  BACK SURGERY    . CARDIAC CATHETERIZATION  2000/2012   with stents in 2000  . CHOLECYSTECTOMY    . COLONOSCOPY    . ELBOW SURGERY     bilateral  . ESOPHAGOGASTRODUODENOSCOPY    . KNEE SURGERY Left   . MICROLARYNGOSCOPY WITH CO2 LASER AND EXCISION OF VOCAL CORD LESION N/A 11/23/2016   Procedure: MICROLARYNGOSCOPY  AND EXCISION OF VOCAL CORD LESION;  Surgeon: Melissa Montane, MD;  Location: [place];  Service: ENT;  Laterality: N/A;  . MICROLARYNGOSCOPY WITH CO2 LASER AND EXCISION OF VOCAL CORD LESION N/A 01/11/2017   Procedure: MICROLARYNGOSCOPY WITH CO2 LASER AND EXCISION OF VOCAL CORD LESION;  Surgeon: Melissa Montane, MD;  Location: [place];  Service: ENT;  Laterality: N/A;  . MICROLARYNGOSCOPY WITH LASER N/A 03/16/2015   Procedure: MICROLARYNGOSCOPY ;  Surgeon: Melissa Montane, MD;  Location: [place];  Service: ENT;  Laterality: N/A;  . peyronie's surgery    . SHOULDER SURGERY Right    rotator cuff  . THROAT SURGERY  1997   cancer removed    Family History  Problem Relation Age of Onset  . Cancer Mother        bone  . Stroke Father   . Healthy Child   . Cancer Other        brain  . Diabetes Other     Social History   Socioeconomic History  . Marital status: Married    Spouse name: Not on file  . Number of children: 4  . Years of education: Not on file  . Highest education level: GED or equivalent  Occupational History  . Occupation: retired  Tobacco Use  . Smoking status: Current Every Day Smoker    Packs/day: 1.00    Years: 50.00    Pack years: 50.00    Types: Cigarettes  . Smokeless tobacco: Never Used  . Tobacco comment: down to .5 ppd  Vaping Use  . Vaping Use: Former  Substance and Sexual Activity  . Alcohol use: No    Alcohol/week: 0.0 standard drinks  . Drug use: No  . Sexual activity: Not on file  Other Topics Concern  . Not on file  Social History Narrative   Lives with wife       One level      Right hand   Social Determinants of Health   Financial Resource  Strain:   . Difficulty of Paying Living Expenses: Not on file  Food Insecurity:   . Worried About Charity fundraiser in the Last Year: Not on file  . Ran Out of Food in the Last Year: Not on file  Transportation Needs:   . Lack of Transportation (Medical): Not on file  . Lack of Transportation (Non-Medical): Not on file  Physical Activity:   . Days of Exercise per Week: Not on file  . Minutes of Exercise per Session: Not on file  Stress:   . Feeling of Stress : Not on file  Social Connections:   . Frequency of Communication with Friends and  Family: Not on file  . Frequency of Social Gatherings with Friends and Family: Not on file  . Attends Religious Services: Not on file  . Active Member of Clubs or Organizations: Not on file  . Attends Archivist Meetings: Not on file  . Marital Status: Not on file  Intimate Partner Violence:   . Fear of Current or Ex-Partner: Not on file  . Emotionally Abused: Not on file  . Physically Abused: Not on file  . Sexually Abused: Not on file    Outpatient Medications Prior to Visit  Medication Sig Dispense Refill  . acetaminophen (TYLENOL) 325 MG tablet Take by mouth.    . ALPRAZolam (XANAX) 0.25 MG tablet Once daily as needed for anxiety. 30 tablet 2  . amLODipine (NORVASC) 5 MG tablet TAKE 1 TABLET BY MOUTH ONCE DAILY . APPOINTMENT REQUIRED FOR FUTURE REFILLS 30 tablet 0  . aspirin 81 MG tablet Take 162 mg by mouth daily.     Marland Kitchen BREO ELLIPTA 100-25 MCG/INH AEPB INHALE 1 PUFF BY MOUTH ONCE DAILY    . clopidogrel (PLAVIX) 75 MG tablet Take 1 tablet by mouth once daily 90 tablet 2  . cyclobenzaprine (FLEXERIL) 10 MG tablet TAKE 1 TABLET BY MOUTH THREE TIMES DAILY AS NEEDED FOR  BACK  SPASMS 90 tablet 2  . gabapentin (NEURONTIN) 400 MG capsule TAKE 1 CAPSULE BY MOUTH THREE TIMES DAILY 90 capsule 0  . Guaifenesin (MUCINEX MAXIMUM STRENGTH) 1200 MG TB12 Take 1 tablet (1,200 mg total) by mouth 2 (two) times daily. 20 tablet 0  .  HYDROcodone-acetaminophen (NORCO/VICODIN) 5-325 MG tablet Take 1 tablet by mouth every 6 (six) hours as needed for moderate pain. 120 tablet 0  . ipratropium-albuterol (DUONEB) 0.5-2.5 (3) MG/3ML SOLN Take 3 mLs by nebulization every 6 (six) hours as needed (for shortness of breath/wheezing. (with bronchitis)).    . Lancets (ONETOUCH DELICA PLUS [id number]) MISC USE 1 UNIT TO CHECK GLUCOSE THREE TIMES DAILY    . metFORMIN (GLUCOPHAGE) 500 MG tablet Take 1 tablet by mouth twice daily 180 tablet 0  . metoprolol tartrate (LOPRESSOR) 50 MG tablet Take 1 tablet (50 mg total) by mouth 2 (two) times daily. 180 tablet 2  . nitroGLYCERIN (NITROSTAT) 0.4 MG SL tablet Place 0.4 mg under the tongue every 6 (six) hours.    Marland Kitchen nystatin cream (MYCOSTATIN) MIX EQUAL PARTS OF NYSTATIN CREAM AND TRIAMCINOLONE CREAM AND APPLY TO AFFECTED AREA TWICE DAILY FOR 2 WEEKS    . omeprazole (PRILOSEC) 20 MG capsule Take 1 capsule (20 mg total) by mouth daily. 90 capsule 3  . ONETOUCH ULTRA test strip USE 1 STRIP TO CHECK GLUCOSE TWICE DAILY    . rosuvastatin (CRESTOR) 20 MG tablet TAKE 1 TABLET BY MOUTH AT NIGHT 90 tablet 0  . SPIRIVA RESPIMAT 2.5 MCG/ACT AERS Inhale 2 puffs into the lungs daily.     . tamsulosin (FLOMAX) 0.4 MG CAPS capsule TAKE 1 CAPSULE BY MOUTH ONCE DAILY AT BEDTIME    . TOUJEO SOLOSTAR 300 UNIT/ML Solostar Pen Inject 30 Units into the skin daily. 6 pen 0  . traZODone (DESYREL) 150 MG tablet Take 1 tablet by mouth once daily 90 tablet 1  . VENTOLIN HFA 108 (90 Base) MCG/ACT inhaler prn  0  . DULoxetine (CYMBALTA) 60 MG capsule Take 1 capsule by mouth twice daily 180 capsule 0  . traMADol (ULTRAM) 50 MG tablet TAKE 1 TABLET BY MOUTH EVERY 6 HOURS AS NEEDED FOR BACK PAIN 20 tablet 0  No facility-administered medications prior to visit.   Allergies:   Penicillins, Prednisone, Doxycycline, and Iodinated diagnostic agents   Social History   Tobacco Use  . Smoking status: Current Every Day Smoker     Packs/day: 1.00    Years: 50.00    Pack years: 50.00    Types: Cigarettes  . Smokeless tobacco: Never Used  . Tobacco comment: down to .5 ppd  Vaping Use  . Vaping Use: Former  Substance Use Topics  . Alcohol use: No    Alcohol/week: 0.0 standard drinks  . Drug use: No     Review of Systems  Constitutional: Negative for chills, fever and malaise/fatigue.  HENT: Negative for ear pain, sinus pain and sore throat.   Respiratory: Negative for cough and shortness of breath.   Cardiovascular: Negative for chest pain.  Musculoskeletal: Negative for myalgias.  Neurological: Positive for headaches.     Labs/Other Tests and Data Reviewed:    Recent Labs: 03/18/2020: Magnesium 1.7; TSH 2.060   Recent Lipid Panel No results found for: CHOL, TRIG, HDL, CHOLHDL, LDLCALC, LDLDIRECT  Wt Readings from Last 3 Encounters:  07/15/20 200 lb (90.7 kg)  04/27/20 200 lb 12.8 oz (91.1 kg)  03/08/20 202 lb (91.6 kg)     Objective:    Vital Signs:  BP 116/68   Pulse 76   Wt 200 lb (90.7 kg)   SpO2 94%   BMI 28.70 kg/m    Physical Exam Constitutional:      Appearance: He is well-developed.  Pulmonary:     Effort: Pulmonary effort is normal.  Neurological:     Mental Status: He is alert.  Psychiatric:        Mood and Affect: Mood normal.      ASSESSMENT & PLAN:   1. Other headache syndrome Scheduled to see Dr. Donivan Scull about neck pain. This may be cause of headaches. Per patient Dr. Donivan Scull did not feel it was etiology. Will proceed with mri of brain. 2. Imbalance Recommend caution.1. Other headache syndrome  - MR Brain Wo Contrast; Future  3. Chronic daily headache - MR Brain Wo Contrast; Future   COVID-19 Education: The signs and symptoms of COVID-19 were discussed with the patient and how to seek care for testing (follow up with PCP or arrange E-visit). The importance of social distancing was discussed today.  Time:   Today, I have spent 12 minutes with the patient with  telehealth technology discussing the above problems.    Follow Up:  In Person prn  Signed, [name], MD  07/21/2020 10:43 PM    [place]

## 2020-07-15 ENCOUNTER — Ambulatory Visit (INDEPENDENT_AMBULATORY_CARE_PROVIDER_SITE_OTHER): Payer: Medicare Other | Admitting: Family Medicine

## 2020-07-15 VITALS — BP 116/68 | HR 76 | Wt 200.0 lb

## 2020-07-15 DIAGNOSIS — R519 Headache, unspecified: Secondary | ICD-10-CM

## 2020-07-15 DIAGNOSIS — R2689 Other abnormalities of gait and mobility: Secondary | ICD-10-CM | POA: Diagnosis not present

## 2020-07-15 DIAGNOSIS — G4489 Other headache syndrome: Secondary | ICD-10-CM | POA: Diagnosis not present

## 2020-07-15 DIAGNOSIS — M5416 Radiculopathy, lumbar region: Secondary | ICD-10-CM | POA: Diagnosis not present

## 2020-07-15 DIAGNOSIS — M4802 Spinal stenosis, cervical region: Secondary | ICD-10-CM | POA: Diagnosis not present

## 2020-07-20 ENCOUNTER — Other Ambulatory Visit: Payer: Self-pay | Admitting: Family Medicine

## 2020-07-21 ENCOUNTER — Encounter: Payer: Self-pay | Admitting: Family Medicine

## 2020-07-23 DIAGNOSIS — M4802 Spinal stenosis, cervical region: Secondary | ICD-10-CM | POA: Diagnosis not present

## 2020-07-23 DIAGNOSIS — M542 Cervicalgia: Secondary | ICD-10-CM | POA: Diagnosis not present

## 2020-07-23 DIAGNOSIS — M5412 Radiculopathy, cervical region: Secondary | ICD-10-CM | POA: Diagnosis not present

## 2020-07-27 ENCOUNTER — Other Ambulatory Visit: Payer: Self-pay | Admitting: Family Medicine

## 2020-07-29 ENCOUNTER — Ambulatory Visit (INDEPENDENT_AMBULATORY_CARE_PROVIDER_SITE_OTHER): Payer: Medicare Other | Admitting: Family Medicine

## 2020-07-29 ENCOUNTER — Other Ambulatory Visit: Payer: Self-pay | Admitting: Family Medicine

## 2020-07-29 VITALS — BP 158/82 | HR 86 | Temp 96.6°F | Wt 200.0 lb

## 2020-07-29 DIAGNOSIS — I10 Essential (primary) hypertension: Secondary | ICD-10-CM | POA: Diagnosis not present

## 2020-07-29 DIAGNOSIS — J441 Chronic obstructive pulmonary disease with (acute) exacerbation: Secondary | ICD-10-CM | POA: Diagnosis not present

## 2020-07-29 DIAGNOSIS — R059 Cough, unspecified: Secondary | ICD-10-CM

## 2020-07-29 DIAGNOSIS — R05 Cough: Secondary | ICD-10-CM

## 2020-07-29 MED ORDER — CEFTRIAXONE SODIUM 1 G IJ SOLR
1.0000 g | INTRAMUSCULAR | Status: DC
Start: 2020-07-29 — End: 2020-07-29
  Administered 2020-07-29: 1 g via INTRAMUSCULAR

## 2020-07-29 MED ORDER — CEFTRIAXONE SODIUM 1 G IJ SOLR
1.0000 g | Freq: Once | INTRAMUSCULAR | Status: AC
  Administered 2020-07-29: 1 g via INTRAMUSCULAR

## 2020-07-29 MED ORDER — PREDNISONE 20 MG PO TABS: ORAL_TABLET | ORAL | 0 refills | Status: AC

## 2020-07-29 MED ORDER — LEVOFLOXACIN 750 MG PO TABS: 750.0000 mg | ORAL_TABLET | Freq: Every day | ORAL | 0 refills | Status: DC

## 2020-07-29 MED ORDER — TRIAMCINOLONE ACETONIDE 40 MG/ML IJ SUSP
80.0000 mg | Freq: Once | INTRAMUSCULAR | Status: AC
  Administered 2020-07-29: 80 mg via INTRAMUSCULAR

## 2020-07-29 NOTE — Progress Notes (Signed)
Acute Office Visit  Subjective:    Patient ID: Don [name], male    DOB: 1951-11-12, 69 y.o.   MRN: [id number]  Chief Complaint  Patient presents with  . Cough    HPI Patient is in today for cough and SOB. Pt has copd. Using inhalers. No fever, chills, sweats, earaches, sore throat, nasal congestion.  Past Medical History:  Diagnosis Date  . Arthritis   . Asymptomatic LV dysfunction 10/18/2017  . Basal cell carcinoma   . Benign prostatic hyperplasia with incomplete bladder emptying 01/14/2019  . Cancer ([place])    throat - 1997, throat - 2018  . Cardiomyopathy, secondary ([place]) 12/01/2016   Overview:  EF 47% 12/26/16  . Chest pain syndrome 11/02/2017  . Chronic coronary artery disease 10/18/2017  . CKD (chronic kidney disease) stage 3, GFR 30-59 ml/min 07/25/2019  . COPD (chronic obstructive pulmonary disease) ([place])   . Coronary artery disease   . Coronary artery disease involving native coronary artery of native heart with angina pectoris ([place]) 12/01/2016   Overview:  He has hx of IWMI in remote past, last cath in 2012 at [facility] showed chronic total occlusion of previously stented RCA, with good collaterals, a 40% LAD stenosis, inferior hypokinesis and EF 45%.    He's been lost to Cardiology f/u since 2015, but has not had any recurrent events  . Depression   . Dyslipidemia 12/01/2016  . Emphysema lung ([place])   . Erectile dysfunction due to diseases classified elsewhere 06/12/2019  . Essential hypertension 10/18/2017  . GERD (gastroesophageal reflux disease)   . Hoarseness 11/15/2016  . Hyperlipidemia   . Hypertension   . Lesion of vocal cord   . Leukoplakia of larynx 11/15/2016  . Malfunction of penile prosthesis ([place]) 01/14/2019  . Melanoma of back ([place])    melanoma on back  . Myocardial infarction ([place]) 2000   2 stents  . PAD (peripheral artery disease) ([place]) 10/18/2017  . Peripheral vascular disease ([facility])    iliac artery clot  . Peyronie's disease 06/12/2019  .  Pharyngoesophageal dysphagia 08/12/2018  . Pneumonia 02/2015  . Preoperative clearance 04/03/2018  . Squamous cell carcinoma of larynx ([place]) 12/13/2016  . Wears glasses     Past Surgical History:  Procedure Laterality Date  . ABDOMINAL ANGIOGRAM N/A 01/13/2015   Procedure: ABDOMINAL ANGIOGRAM;  Surgeon: Rosetta Posner, MD;  Location: [facility] CATH LAB;  Service: Cardiovascular;  Laterality: N/A;  . APPENDECTOMY    . BACK SURGERY    . CARDIAC CATHETERIZATION  2000/2012   with stents in 2000  . CHOLECYSTECTOMY    . COLONOSCOPY    . ELBOW SURGERY     bilateral  . ESOPHAGOGASTRODUODENOSCOPY    . KNEE SURGERY Left   . MICROLARYNGOSCOPY WITH CO2 LASER AND EXCISION OF VOCAL CORD LESION N/A 11/23/2016   Procedure: MICROLARYNGOSCOPY  AND EXCISION OF VOCAL CORD LESION;  Surgeon: Melissa Montane, MD;  Location: [place];  Service: ENT;  Laterality: N/A;  . MICROLARYNGOSCOPY WITH CO2 LASER AND EXCISION OF VOCAL CORD LESION N/A 01/11/2017   Procedure: MICROLARYNGOSCOPY WITH CO2 LASER AND EXCISION OF VOCAL CORD LESION;  Surgeon: Melissa Montane, MD;  Location: [place];  Service: ENT;  Laterality: N/A;  . MICROLARYNGOSCOPY WITH LASER N/A 03/16/2015   Procedure: MICROLARYNGOSCOPY ;  Surgeon: Melissa Montane, MD;  Location: [place];  Service: ENT;  Laterality: N/A;  . peyronie's surgery    . SHOULDER SURGERY Right    rotator cuff  . THROAT SURGERY  1997   cancer removed    Family History  Problem Relation Age of Onset  . Cancer Mother        bone  . Stroke Father   . Healthy Child   . Cancer Other        brain  . Diabetes Other     Social History   Socioeconomic History  . Marital status: Married    Spouse name: Not on file  . Number of children: 4  . Years of education: Not on file  . Highest education level: GED or equivalent  Occupational History  . Occupation: retired  Tobacco Use  . Smoking status: Current Every Day Smoker    Packs/day: 1.00    Years: 50.00    Pack years: 50.00    Types: Cigarettes  .  Smokeless tobacco: Never Used  . Tobacco comment: down to .5 ppd  Vaping Use  . Vaping Use: Former  Substance and Sexual Activity  . Alcohol use: No    Alcohol/week: 0.0 standard drinks  . Drug use: No  . Sexual activity: Not on file  Other Topics Concern  . Not on file  Social History Narrative   Lives with wife       One level      Right hand   Social Determinants of Health   Financial Resource Strain:   . Difficulty of Paying Living Expenses: Not on file  Food Insecurity:   . Worried About Charity fundraiser in the Last Year: Not on file  . Ran Out of Food in the Last Year: Not on file  Transportation Needs:   . Lack of Transportation (Medical): Not on file  . Lack of Transportation (Non-Medical): Not on file  Physical Activity:   . Days of Exercise per Week: Not on file  . Minutes of Exercise per Session: Not on file  Stress:   . Feeling of Stress : Not on file  Social Connections:   . Frequency of Communication with Friends and Family: Not on file  . Frequency of Social Gatherings with Friends and Family: Not on file  . Attends Religious Services: Not on file  . Active Member of Clubs or Organizations: Not on file  . Attends Archivist Meetings: Not on file  . Marital Status: Not on file  Intimate Partner Violence:   . Fear of Current or Ex-Partner: Not on file  . Emotionally Abused: Not on file  . Physically Abused: Not on file  . Sexually Abused: Not on file    Outpatient Medications Prior to Visit  Medication Sig Dispense Refill  . acetaminophen (TYLENOL) 325 MG tablet Take by mouth.    . ALPRAZolam (XANAX) 0.25 MG tablet TAKE 1 TABLET BY MOUTH ONCE DAILY AS NEEDED FOR ANXIETY 30 tablet 0  . amLODipine (NORVASC) 5 MG tablet TAKE 1 TABLET BY MOUTH ONCE DAILY . APPOINTMENT REQUIRED FOR FUTURE REFILLS 30 tablet 0  . aspirin 81 MG tablet Take 162 mg by mouth daily.     Marland Kitchen BREO ELLIPTA 100-25 MCG/INH AEPB INHALE 1 PUFF BY MOUTH ONCE DAILY    .  clopidogrel (PLAVIX) 75 MG tablet Take 1 tablet by mouth once daily 90 tablet 2  . cyclobenzaprine (FLEXERIL) 10 MG tablet TAKE 1 TABLET BY MOUTH THREE TIMES DAILY AS NEEDED FOR  BACK  SPASMS 90 tablet 2  . DULoxetine (CYMBALTA) 60 MG capsule Take 1 capsule by mouth twice daily 180 capsule 0  . Guaifenesin [facility]  MAXIMUM STRENGTH) 1200 MG TB12 Take 1 tablet (1,200 mg total) by mouth 2 (two) times daily. 20 tablet 0  . HYDROcodone-acetaminophen (NORCO/VICODIN) 5-325 MG tablet Take 1 tablet by mouth every 6 (six) hours as needed for moderate pain. 120 tablet 0  . ipratropium-albuterol (DUONEB) 0.5-2.5 (3) MG/3ML SOLN Take 3 mLs by nebulization every 6 (six) hours as needed (for shortness of breath/wheezing. (with bronchitis)).    . Lancets (ONETOUCH DELICA PLUS [id number]) MISC USE 1 UNIT TO CHECK GLUCOSE THREE TIMES DAILY    . metFORMIN (GLUCOPHAGE) 500 MG tablet Take 1 tablet by mouth twice daily 180 tablet 0  . metoprolol tartrate (LOPRESSOR) 50 MG tablet Take 1 tablet (50 mg total) by mouth 2 (two) times daily. 180 tablet 2  . nitroGLYCERIN (NITROSTAT) 0.4 MG SL tablet Place 0.4 mg under the tongue every 6 (six) hours.    Marland Kitchen nystatin cream (MYCOSTATIN) MIX EQUAL PARTS OF NYSTATIN CREAM AND TRIAMCINOLONE CREAM AND APPLY TO AFFECTED AREA TWICE DAILY FOR 2 WEEKS    . omeprazole (PRILOSEC) 20 MG capsule Take 1 capsule (20 mg total) by mouth daily. 90 capsule 3  . ONETOUCH ULTRA test strip USE 1 STRIP TO CHECK GLUCOSE TWICE DAILY    . rosuvastatin (CRESTOR) 20 MG tablet TAKE 1 TABLET BY MOUTH AT NIGHT 90 tablet 0  . SPIRIVA RESPIMAT 2.5 MCG/ACT AERS Inhale 2 puffs into the lungs daily.     . tamsulosin (FLOMAX) 0.4 MG CAPS capsule TAKE 1 CAPSULE BY MOUTH ONCE DAILY AT BEDTIME    . TOUJEO SOLOSTAR 300 UNIT/ML Solostar Pen Inject 30 Units into the skin daily. 6 pen 0  . traZODone (DESYREL) 150 MG tablet Take 1 tablet by mouth once daily 90 tablet 1  . VENTOLIN HFA 108 (90 Base) MCG/ACT inhaler prn  0    . gabapentin (NEURONTIN) 400 MG capsule TAKE 1 CAPSULE BY MOUTH THREE TIMES DAILY 90 capsule 0   No facility-administered medications prior to visit.    Allergies  Allergen Reactions  . Penicillins Rash and Hives    Has patient had a PCN reaction causing immediate rash, facial/tongue/throat swelling, SOB or lightheadedness with hypotension:unsure Has patient had a PCN reaction causing severe rash involving mucus membranes or skin necrosis:unsure Has patient had a PCN reaction that required hospitalization:No Has patient had a PCN reaction occurring within the last 10 years:No If all of the above answers are "NO", then may proceed with Cephalosporin use.  rash  . Prednisone     Depressed mood - can tolerate it, but it makes him very irritable  . Doxycycline Rash  . Iodinated Diagnostic Agents Rash    Also developed blisters Also developed blisters    Review of Systems  Constitutional: Positive for diaphoresis. Negative for chills, fatigue and fever.  HENT: Negative for congestion, ear pain and sore throat.   Respiratory: Positive for cough and shortness of breath.   Cardiovascular: Negative for chest pain and leg swelling.  Neurological: Negative for headaches.       Objective:    Physical Exam Constitutional:      Appearance: Normal appearance.  HENT:     Right Ear: Tympanic membrane, ear canal and external ear normal.     Left Ear: Tympanic membrane, ear canal and external ear normal.     Nose: No congestion or rhinorrhea.     Mouth/Throat:     Mouth: Mucous membranes are moist.     Pharynx: No oropharyngeal exudate or posterior oropharyngeal erythema.  Cardiovascular:     Rate and Rhythm: Normal rate and regular rhythm.     Heart sounds: Normal heart sounds.  Pulmonary:     Effort: Pulmonary effort is normal. No respiratory distress.     Breath sounds: Wheezing present. No rales.  Neurological:     Mental Status: He is alert.  Psychiatric:        Mood and  Affect: Mood normal.        Behavior: Behavior normal.     BP (!) 158/82   Pulse 86   Temp (!) 96.6 F (35.9 C)   Wt 200 lb (90.7 kg)   SpO2 (!) 85%   BMI 28.70 kg/m  Wt Readings from Last 3 Encounters:  08/02/20 200 lb (90.7 kg)  07/15/20 200 lb (90.7 kg)  04/27/20 200 lb 12.8 oz (91.1 kg)    Health Maintenance Due  Topic Date Due  . HEMOGLOBIN A1C  Never done  . Hepatitis C Screening  Never done  . FOOT EXAM  Never done  . OPHTHALMOLOGY EXAM  Never done  . URINE MICROALBUMIN  Never done  . COVID-19 Vaccine (1) Never done  . TETANUS/TDAP  Never done  . COLONOSCOPY  10/27/2007  . PNA vac Low Risk Adult (1 of 2 - PCV13) Never done  . INFLUENZA VACCINE  06/13/2020    There are no preventive care reminders to display for this patient.   Lab Results  Component Value Date   TSH 2.060 03/18/2020   Lab Results  Component Value Date   WBC 12.2 (H) 01/10/2017   HGB 15.0 01/10/2017   HCT 43.4 01/10/2017   MCV 91.8 01/10/2017   PLT 195 01/10/2017   Lab Results  Component Value Date   NA 140 01/10/2017   K 4.2 01/10/2017   CO2 27 01/10/2017   GLUCOSE 125 (H) 01/10/2017   BUN 16 01/10/2017   CREATININE 0.98 01/10/2017   BILITOT 0.6 05/29/2007   ALKPHOS 78 05/29/2007   AST 21 05/29/2007   ALT 20 05/29/2007   PROT 6.3 03/18/2020   ALBUMIN 3.7 05/29/2007   CALCIUM 9.1 01/10/2017   ANIONGAP 7 01/10/2017   No results found for: CHOL No results found for: HDL No results found for: LDLCALC No results found for: TRIG No results found for: CHOLHDL No results found for: HGBA1C     Assessment & Plan:  1. COPD with acute exacerbation ([place]) Start on levaquin and prednisone. - triamcinolone acetonide (KENALOG-40) injection 80 mg - cefTRIAXone (ROCEPHIN) injection 1 g   2. Cough Covid 19 poct negative.   Meds ordered this encounter  Medications  . levofloxacin (LEVAQUIN) 750 MG tablet    Sig: Take 1 tablet (750 mg total) by mouth daily.    Dispense:  7 tablet     Refill:  0  . predniSONE (DELTASONE) 20 MG tablet    Sig: Take 1 tablet (20 mg total) by mouth with breakfast, with lunch, and with evening meal for 3 days, THEN 1 tablet (20 mg total) 2 (two) times daily with a meal for 3 days, THEN 1 tablet (20 mg total) daily with breakfast for 3 days.    Dispense:  18 tablet    Refill:  0  . DISCONTD: cefTRIAXone (ROCEPHIN) injection 1 g    Order Specific Question:   Antibiotic Indication:    Answer:   Other Indication (list below)    Order Specific Question:   Other Indication:    Answer:   copd exacerbation.  concerned about CAP.  Marland Kitchen triamcinolone acetonide (KENALOG-40) injection 80 mg  . cefTRIAXone (ROCEPHIN) injection 1 g    Orders Placed This Encounter  Procedures  . POC COVID-19     Follow-up: No follow-ups on file.  An After Visit Summary was printed and given to the patient.  [name] [facility] [phone]

## 2020-08-02 ENCOUNTER — Encounter: Payer: Self-pay | Admitting: Family Medicine

## 2020-08-02 DIAGNOSIS — R519 Headache, unspecified: Secondary | ICD-10-CM | POA: Diagnosis not present

## 2020-08-02 DIAGNOSIS — R2689 Other abnormalities of gait and mobility: Secondary | ICD-10-CM | POA: Diagnosis not present

## 2020-08-02 LAB — POC COVID19 BINAXNOW: SARS Coronavirus 2 Ag: NEGATIVE

## 2020-08-04 ENCOUNTER — Other Ambulatory Visit: Payer: Self-pay | Admitting: Family Medicine

## 2020-08-04 ENCOUNTER — Encounter: Payer: Self-pay | Admitting: Family Medicine

## 2020-08-04 DIAGNOSIS — I6930 Unspecified sequelae of cerebral infarction: Secondary | ICD-10-CM

## 2020-08-09 DIAGNOSIS — R0902 Hypoxemia: Secondary | ICD-10-CM | POA: Diagnosis not present

## 2020-08-10 MED ORDER — LISINOPRIL-HYDROCHLOROTHIAZIDE 10-12.5 MG PO TABS: 1.0000 | ORAL_TABLET | Freq: Every day | ORAL | 0 refills | Status: DC

## 2020-08-10 NOTE — Addendum Note (Signed)
Addended byRochel Brome on: 08/10/2020 12:34 PM   Modules accepted: Orders

## 2020-08-12 ENCOUNTER — Other Ambulatory Visit: Payer: Self-pay

## 2020-08-12 MED ORDER — HYDROCODONE-ACETAMINOPHEN 5-325 MG PO TABS
1.0000 | ORAL_TABLET | Freq: Four times a day (QID) | ORAL | 0 refills | Status: DC | PRN
Start: 2020-08-12 — End: 2020-09-27

## 2020-08-13 ENCOUNTER — Encounter: Payer: Self-pay | Admitting: Family Medicine

## 2020-08-13 ENCOUNTER — Ambulatory Visit (INDEPENDENT_AMBULATORY_CARE_PROVIDER_SITE_OTHER): Payer: Medicare Other

## 2020-08-13 ENCOUNTER — Encounter: Payer: Self-pay | Admitting: Neurology

## 2020-08-13 ENCOUNTER — Other Ambulatory Visit: Payer: Self-pay | Admitting: Family Medicine

## 2020-08-13 DIAGNOSIS — M4802 Spinal stenosis, cervical region: Secondary | ICD-10-CM | POA: Diagnosis not present

## 2020-08-13 DIAGNOSIS — I517 Cardiomegaly: Secondary | ICD-10-CM | POA: Diagnosis not present

## 2020-08-13 DIAGNOSIS — Z23 Encounter for immunization: Secondary | ICD-10-CM

## 2020-08-13 DIAGNOSIS — I25119 Atherosclerotic heart disease of native coronary artery with unspecified angina pectoris: Secondary | ICD-10-CM | POA: Diagnosis not present

## 2020-08-13 DIAGNOSIS — I6930 Unspecified sequelae of cerebral infarction: Secondary | ICD-10-CM | POA: Diagnosis not present

## 2020-08-13 DIAGNOSIS — I10 Essential (primary) hypertension: Secondary | ICD-10-CM | POA: Diagnosis not present

## 2020-08-13 DIAGNOSIS — I63233 Cerebral infarction due to unspecified occlusion or stenosis of bilateral carotid arteries: Secondary | ICD-10-CM | POA: Diagnosis not present

## 2020-08-13 DIAGNOSIS — E785 Hyperlipidemia, unspecified: Secondary | ICD-10-CM | POA: Diagnosis not present

## 2020-08-16 ENCOUNTER — Other Ambulatory Visit: Payer: Self-pay | Admitting: Family Medicine

## 2020-08-16 DIAGNOSIS — Z23 Encounter for immunization: Secondary | ICD-10-CM | POA: Diagnosis not present

## 2020-08-16 NOTE — Progress Notes (Signed)
    Patient Name: Don [name] Patient DOB: August 12, 1951  08/13/20  Don [name] came in today for his 2021-2022 influenza vaccine.  Patient tolerated it well.   [name]

## 2020-08-17 ENCOUNTER — Ambulatory Visit: Payer: Medicare Other

## 2020-08-19 ENCOUNTER — Ambulatory Visit: Payer: Medicare Other | Admitting: Family Medicine

## 2020-08-23 ENCOUNTER — Other Ambulatory Visit: Payer: Self-pay

## 2020-08-23 ENCOUNTER — Encounter: Payer: Self-pay | Admitting: Nurse Practitioner

## 2020-08-23 ENCOUNTER — Ambulatory Visit: Payer: Medicare Other | Admitting: Family Medicine

## 2020-08-23 VITALS — BP 118/72 | HR 72 | Temp 96.6°F | Resp 18 | Ht 70.0 in | Wt 195.6 lb

## 2020-08-23 DIAGNOSIS — E1142 Type 2 diabetes mellitus with diabetic polyneuropathy: Secondary | ICD-10-CM

## 2020-08-23 DIAGNOSIS — Z01818 Encounter for other preprocedural examination: Secondary | ICD-10-CM | POA: Diagnosis not present

## 2020-08-23 DIAGNOSIS — Z79899 Other long term (current) drug therapy: Secondary | ICD-10-CM | POA: Diagnosis not present

## 2020-08-23 DIAGNOSIS — M542 Cervicalgia: Secondary | ICD-10-CM

## 2020-08-23 DIAGNOSIS — J449 Chronic obstructive pulmonary disease, unspecified: Secondary | ICD-10-CM | POA: Diagnosis not present

## 2020-08-23 DIAGNOSIS — G4489 Other headache syndrome: Secondary | ICD-10-CM | POA: Diagnosis not present

## 2020-08-23 DIAGNOSIS — E119 Type 2 diabetes mellitus without complications: Secondary | ICD-10-CM

## 2020-08-23 DIAGNOSIS — M79603 Pain in arm, unspecified: Secondary | ICD-10-CM | POA: Diagnosis not present

## 2020-08-23 DIAGNOSIS — R911 Solitary pulmonary nodule: Secondary | ICD-10-CM | POA: Diagnosis not present

## 2020-08-23 DIAGNOSIS — E559 Vitamin D deficiency, unspecified: Secondary | ICD-10-CM | POA: Diagnosis not present

## 2020-08-23 DIAGNOSIS — R52 Pain, unspecified: Secondary | ICD-10-CM | POA: Diagnosis not present

## 2020-08-23 DIAGNOSIS — I110 Hypertensive heart disease with heart failure: Secondary | ICD-10-CM

## 2020-08-23 LAB — POCT URINALYSIS DIP (CLINITEK)
Bilirubin, UA: NEGATIVE
Blood, UA: NEGATIVE
Glucose, UA: NEGATIVE mg/dL
Ketones, POC UA: NEGATIVE mg/dL
Leukocytes, UA: NEGATIVE
Nitrite, UA: NEGATIVE
POC PROTEIN,UA: NEGATIVE
Spec Grav, UA: 1.025 (ref 1.010–1.025)
Urobilinogen, UA: 0.2 E.U./dL
pH, UA: 5 (ref 5.0–8.0)

## 2020-08-23 LAB — POCT UA - MICROALBUMIN: Microalbumin Ur, POC: 80 mg/L

## 2020-08-23 MED ORDER — GABAPENTIN 400 MG PO CAPS
400.0000 mg | ORAL_CAPSULE | Freq: Three times a day (TID) | ORAL | 1 refills | Status: DC
Start: 2020-08-23 — End: 2020-10-25

## 2020-08-23 MED ORDER — ALPRAZOLAM 0.25 MG PO TABS
ORAL_TABLET | ORAL | 2 refills | Status: DC
Start: 2020-08-23 — End: 2020-11-08

## 2020-08-23 MED ORDER — TRAZODONE HCL 150 MG PO TABS
150.0000 mg | ORAL_TABLET | Freq: Every day | ORAL | 1 refills | Status: DC
Start: 2020-08-23 — End: 2020-11-22

## 2020-08-23 MED ORDER — ROSUVASTATIN CALCIUM 40 MG PO TABS: 40.0000 mg | ORAL_TABLET | Freq: Every day | ORAL | 0 refills | Status: DC

## 2020-08-23 NOTE — Progress Notes (Signed)
Subjective:  Patient ID: Don [name], male    DOB: 11-Nov-1951  Age: 69 y.o. MRN: [id number]  Chief Complaint  Patient presents with  . surgical clearance    HPI  Patient presents for preoperative clearance for neck surgery. He has persistent neck pain and has had severe headaches. He has seen Dr. Donivan Scull, who has recommended surgery.  Pt presents with hyperlipidemia.  Date of diagnosis 2000.  Current treatment includes Crestor.  Compliance with treatment has been good; he takes his medication as directed and follows up as directed.  Follows a low-cholesterol diet.  He denies experiencing any hypercholesterolemia related symptoms.      Pt presents for follow up of hypertension.  His current cardiac medication regimen includes amlodipine,  and a beta-blocker ( metoprolol ).  He is tolerating the medication well without side effects.  Compliance with treatment has been good; he takes his medication as directed, maintains his diet, and follows up as directed.      Additionally, he presents with history of type 2 diabetes mellitus with diabetic polyneuropathy.  specifically, this is type 2, non-insulin requiring diabetes without complications.  Compliance with treatment has been good; he takes his medication as directed, maintains his diet and exercise regimen, follows up as directed, and is keeping a glucose diary.  Current meds include an oral hypoglycemic ( Glucophage ( 500mg  qd ) ) and insulin/injectable ( Toujeo 30 U daily ). He checks his glucose 1 times per day. His most recent a1c was 6.8. In regard to preventative care, he performs foot exams daily. Concurrent health problems include hypertension and hypercholesterolemia.    Patient COPD is well controlled on Spiriva 2 puffs daily, Breo 1 inhalation daily, Ventolin HFA I 2 puffs 4 times a day as needed.  Denies shortness of breath, coughing or chest pain.  He does have home oxygen which he wears at night at 2 L.  Current Outpatient  Medications on File Prior to Visit  Medication Sig Dispense Refill  . acetaminophen (TYLENOL) 325 MG tablet Take by mouth.    Marland Kitchen amLODipine (NORVASC) 5 MG tablet TAKE 1 TABLET BY MOUTH ONCE DAILY . APPOINTMENT REQUIRED FOR FUTURE REFILLS 30 tablet 0  . aspirin 81 MG tablet Take 162 mg by mouth daily.     Marland Kitchen BREO ELLIPTA 100-25 MCG/INH AEPB INHALE 1 PUFF BY MOUTH ONCE DAILY    . clopidogrel (PLAVIX) 75 MG tablet Take 1 tablet by mouth once daily 90 tablet 2  . cyclobenzaprine (FLEXERIL) 10 MG tablet TAKE 1 TABLET BY MOUTH THREE TIMES DAILY AS NEEDED FOR  BACK  SPASMS 90 tablet 2  . DULoxetine (CYMBALTA) 60 MG capsule Take 1 capsule by mouth twice daily 180 capsule 0  . Guaifenesin (MUCINEX MAXIMUM STRENGTH) 1200 MG TB12 Take 1 tablet (1,200 mg total) by mouth 2 (two) times daily. 20 tablet 0  . HYDROcodone-acetaminophen (NORCO/VICODIN) 5-325 MG tablet Take 1 tablet by mouth every 6 (six) hours as needed for moderate pain. 120 tablet 0  . ipratropium-albuterol (DUONEB) 0.5-2.5 (3) MG/3ML SOLN Take 3 mLs by nebulization every 6 (six) hours as needed (for shortness of breath/wheezing. (with bronchitis)).    . Lancets (ONETOUCH DELICA PLUS [id number]) MISC USE 1 UNIT TO CHECK GLUCOSE THREE TIMES DAILY    . levofloxacin (LEVAQUIN) 750 MG tablet Take 1 tablet (750 mg total) by mouth daily. 7 tablet 0  . lisinopril-hydrochlorothiazide (ZESTORETIC) 10-12.5 MG tablet Take 1 tablet by mouth daily. 90 tablet 0  .  metFORMIN (GLUCOPHAGE) 500 MG tablet Take 1 tablet by mouth twice daily 180 tablet 0  . metoprolol tartrate (LOPRESSOR) 50 MG tablet Take 1 tablet (50 mg total) by mouth 2 (two) times daily. 180 tablet 2  . nitroGLYCERIN (NITROSTAT) 0.4 MG SL tablet Place 0.4 mg under the tongue every 6 (six) hours.    Marland Kitchen nystatin cream (MYCOSTATIN) MIX EQUAL PARTS OF NYSTATIN CREAM AND TRIAMCINOLONE CREAM AND APPLY TO AFFECTED AREA TWICE DAILY FOR 2 WEEKS    . omeprazole (PRILOSEC) 20 MG capsule Take 1 capsule (20 mg  total) by mouth daily. 90 capsule 3  . ONETOUCH ULTRA test strip USE 1 STRIP TO CHECK GLUCOSE TWICE DAILY 200 each 3  . SPIRIVA RESPIMAT 2.5 MCG/ACT AERS Inhale 2 puffs into the lungs daily.     . tamsulosin (FLOMAX) 0.4 MG CAPS capsule TAKE 1 CAPSULE BY MOUTH ONCE DAILY AT BEDTIME    . TOUJEO SOLOSTAR 300 UNIT/ML Solostar Pen Inject 30 Units into the skin daily. 6 pen 0  . VENTOLIN HFA 108 (90 Base) MCG/ACT inhaler prn  0   No current facility-administered medications on file prior to visit.   Past Medical History:  Diagnosis Date  . Arthritis   . Asymptomatic LV dysfunction 10/18/2017  . Basal cell carcinoma   . Benign prostatic hyperplasia with incomplete bladder emptying 01/14/2019  . Cancer ([place])    throat - 1997, throat - 2018  . Cardiomyopathy, secondary ([place]) 12/01/2016   Overview:  EF 47% 12/26/16  . Chest pain syndrome 11/02/2017  . Chronic coronary artery disease 10/18/2017  . CKD (chronic kidney disease) stage 3, GFR 30-59 ml/min 07/25/2019  . COPD (chronic obstructive pulmonary disease) ([place])   . Coronary artery disease   . Coronary artery disease involving native coronary artery of native heart with angina pectoris ([place]) 12/01/2016   Overview:  He has hx of IWMI in remote past, last cath in 2012 at [facility] showed chronic total occlusion of previously stented RCA, with good collaterals, a 40% LAD stenosis, inferior hypokinesis and EF 45%.    He's been lost to Cardiology f/u since 2015, but has not had any recurrent events  . Depression   . Dyslipidemia 12/01/2016  . Emphysema lung ([place])   . Erectile dysfunction due to diseases classified elsewhere 06/12/2019  . Essential hypertension 10/18/2017  . GERD (gastroesophageal reflux disease)   . Hoarseness 11/15/2016  . Hyperlipidemia   . Hypertension   . Lesion of vocal cord   . Leukoplakia of larynx 11/15/2016  . Malfunction of penile prosthesis ([place]) 01/14/2019  . Melanoma of back ([place])    melanoma on back  . Myocardial infarction ([place])  2000   2 stents  . PAD (peripheral artery disease) ([place]) 10/18/2017  . Peripheral vascular disease ([facility])    iliac artery clot  . Peyronie's disease 06/12/2019  . Pharyngoesophageal dysphagia 08/12/2018  . Pneumonia 02/2015  . Preoperative clearance 04/03/2018  . Squamous cell carcinoma of larynx ([place]) 12/13/2016  . Wears glasses    Past Surgical History:  Procedure Laterality Date  . ABDOMINAL ANGIOGRAM N/A 01/13/2015   Procedure: ABDOMINAL ANGIOGRAM;  Surgeon: Rosetta Posner, MD;  Location: [facility] CATH LAB;  Service: Cardiovascular;  Laterality: N/A;  . APPENDECTOMY    . BACK SURGERY    . CARDIAC CATHETERIZATION  2000/2012   with stents in 2000  . CHOLECYSTECTOMY    . COLONOSCOPY    . ELBOW SURGERY     bilateral  . ESOPHAGOGASTRODUODENOSCOPY    .  KNEE SURGERY Left   . MICROLARYNGOSCOPY WITH CO2 LASER AND EXCISION OF VOCAL CORD LESION N/A 11/23/2016   Procedure: MICROLARYNGOSCOPY  AND EXCISION OF VOCAL CORD LESION;  Surgeon: Melissa Montane, MD;  Location: [place];  Service: ENT;  Laterality: N/A;  . MICROLARYNGOSCOPY WITH CO2 LASER AND EXCISION OF VOCAL CORD LESION N/A 01/11/2017   Procedure: MICROLARYNGOSCOPY WITH CO2 LASER AND EXCISION OF VOCAL CORD LESION;  Surgeon: Melissa Montane, MD;  Location: [place];  Service: ENT;  Laterality: N/A;  . MICROLARYNGOSCOPY WITH LASER N/A 03/16/2015   Procedure: MICROLARYNGOSCOPY ;  Surgeon: Melissa Montane, MD;  Location: [place];  Service: ENT;  Laterality: N/A;  . peyronie's surgery    . SHOULDER SURGERY Right    rotator cuff  . THROAT SURGERY  1997   cancer removed    Family History  Problem Relation Age of Onset  . Cancer Mother        bone  . Stroke Father   . Healthy Child   . Cancer Other        brain  . Diabetes Other    Social History   Socioeconomic History  . Marital status: Married    Spouse name: Not on file  . Number of children: 4  . Years of education: Not on file  . Highest education level: GED or equivalent  Occupational History  .  Occupation: retired  Tobacco Use  . Smoking status: Current Every Day Smoker    Packs/day: 1.00    Years: 50.00    Pack years: 50.00    Types: Cigarettes  . Smokeless tobacco: Never Used  . Tobacco comment: down to .5 ppd  Vaping Use  . Vaping Use: Former  Substance and Sexual Activity  . Alcohol use: No    Alcohol/week: 0.0 standard drinks  . Drug use: No  . Sexual activity: Not on file  Other Topics Concern  . Not on file  Social History Narrative   Lives with wife       One level      Right hand   Social Determinants of Health   Financial Resource Strain:   . Difficulty of Paying Living Expenses: Not on file  Food Insecurity:   . Worried About Charity fundraiser in the Last Year: Not on file  . Ran Out of Food in the Last Year: Not on file  Transportation Needs:   . Lack of Transportation (Medical): Not on file  . Lack of Transportation (Non-Medical): Not on file  Physical Activity:   . Days of Exercise per Week: Not on file  . Minutes of Exercise per Session: Not on file  Stress:   . Feeling of Stress : Not on file  Social Connections:   . Frequency of Communication with Friends and Family: Not on file  . Frequency of Social Gatherings with Friends and Family: Not on file  . Attends Religious Services: Not on file  . Active Member of Clubs or Organizations: Not on file  . Attends Archivist Meetings: Not on file  . Marital Status: Not on file    Review of Systems  Constitutional: Negative for chills, fatigue and fever.  HENT: Negative for congestion, ear pain and sore throat.   Respiratory: Positive for shortness of breath. Negative for cough.   Cardiovascular: Negative for chest pain.  Gastrointestinal: Negative for abdominal pain, constipation, diarrhea, nausea and vomiting.  Endocrine: Negative for polydipsia, polyphagia and polyuria.  Genitourinary: Negative for dysuria and  frequency.  Musculoskeletal: Negative for arthralgias and myalgias.    Neurological: Negative for dizziness and headaches.  Psychiatric/Behavioral: Negative for dysphoric mood.       No dysphoria   Objective:  BP 118/72   Pulse 72   Temp (!) 96.6 F (35.9 C)   Resp 18   Ht 5\' 10"  (1.778 m)   Wt 195 lb 9.6 oz (88.7 kg)   BMI 28.07 kg/m   BP/Weight 08/23/2020 7/59/1638 02/16/6598  Systolic BP 357 017 793  Diastolic BP 72 82 68  Wt. (Lbs) 195.6 200 200  BMI 28.07 28.7 28.7    Physical Exam Vitals reviewed.  Constitutional:      Appearance: Normal appearance. He is normal weight.  HENT:     Right Ear: Tympanic membrane normal.     Left Ear: Tympanic membrane normal.     Nose: Nose normal.     Mouth/Throat:     Mouth: Mucous membranes are moist.  Neck:     Vascular: No carotid bruit.  Cardiovascular:     Rate and Rhythm: Normal rate and regular rhythm.     Pulses: Normal pulses.     Heart sounds: Normal heart sounds.  Pulmonary:     Effort: Pulmonary effort is normal.     Breath sounds: Normal breath sounds. No wheezing, rhonchi or rales.  Abdominal:     General: Bowel sounds are normal.     Palpations: Abdomen is soft.     Tenderness: There is no abdominal tenderness.  Neurological:     Mental Status: He is alert.  Psychiatric:        Mood and Affect: Mood normal.        Behavior: Behavior normal.    Lab Results  Component Value Date   WBC 12.2 (H) 01/10/2017   HGB 15.0 01/10/2017   HCT 43.4 01/10/2017   PLT 195 01/10/2017   GLUCOSE 125 (H) 01/10/2017   ALT 20 05/29/2007   AST 21 05/29/2007   NA 140 01/10/2017   K 4.2 01/10/2017   CL 106 01/10/2017   CREATININE 0.98 01/10/2017   BUN 16 01/10/2017   CO2 27 01/10/2017   TSH 2.060 03/18/2020   INR 1.05 03/16/2015   MICROALBUR 80 08/23/2020      Assessment & Plan:   1. Pre-operative clearance Cleared for surgery - POCT URINALYSIS DIP (CLINITEK) - normal. - Urine Culture - sent macrobid. Hold aspirin and plavix x 1 week prior to surgery.  2. Diabetic  polyneuropathy associated with type 2 diabetes mellitus ([place]) The current medical regimen is effective;  continue present plan and medications. - POCT UA - Microalbumin  3. Chronic obstructive pulmonary disease, unspecified COPD type ([place]) The current medical regimen is effective;  continue present plan and medications.  4. Other headache syndrome Due to neck pain  5. Neck pain May proceed with surgery.   6. Hypertensive heart disease with heart failure ([place])  The current medical regimen is effective;  continue present plan and medications.  Meds ordered this encounter  Medications  . traZODone (DESYREL) 150 MG tablet    Sig: Take 1 tablet (150 mg total) by mouth daily.    Dispense:  90 tablet    Refill:  1  . gabapentin (NEURONTIN) 400 MG capsule    Sig: Take 1 capsule (400 mg total) by mouth 3 (three) times daily.    Dispense:  90 capsule    Refill:  1  . ALPRAZolam (XANAX) 0.25 MG tablet  Sig: TAKE 1 TABLET BY MOUTH ONCE DAILY AS NEEDED FOR ANXIETY    Dispense:  30 tablet    Refill:  2  . rosuvastatin (CRESTOR) 40 MG tablet    Sig: Take 1 tablet (40 mg total) by mouth daily.    Dispense:  90 tablet    Refill:  0    Orders Placed This Encounter  Procedures  . Urine Culture  . POCT UA - Microalbumin  . POCT URINALYSIS DIP (CLINITEK)     Follow-up: Return in about 3 months (around 11/23/2020) for fasting.  An After Visit Summary was printed and given to the patient.  [name] [facility] [phone]

## 2020-08-26 ENCOUNTER — Other Ambulatory Visit: Payer: Self-pay

## 2020-08-26 LAB — URINE CULTURE

## 2020-08-27 ENCOUNTER — Other Ambulatory Visit: Payer: Self-pay

## 2020-08-27 MED ORDER — NITROFURANTOIN MONOHYD MACRO 100 MG PO CAPS: 100.0000 mg | ORAL_CAPSULE | Freq: Two times a day (BID) | ORAL | 0 refills | Status: AC

## 2020-08-29 ENCOUNTER — Encounter: Payer: Self-pay | Admitting: Family Medicine

## 2020-08-31 ENCOUNTER — Ambulatory Visit: Payer: Medicare Other

## 2020-09-08 DIAGNOSIS — R0902 Hypoxemia: Secondary | ICD-10-CM | POA: Diagnosis not present

## 2020-09-08 NOTE — Progress Notes (Deleted)
NEUROLOGY FOLLOW UP OFFICE NOTE  Don [name] [id number]  HISTORY OF PRESENT ILLNESS: Don [name] is a 69 year old Caucasian man with hypertension, CAD, COPD, peripheral vascular disease, diabetes with diabetic peripheral neuropathy, arthritis and history of throat cancer, melanoma and throat cancer who follows up for cognitive impairment and seizure-like episodes. He is accompanied by his wife who supplements history.  UPDATE: Last seen in November 2020.  At that time, he had a seizure-like episode.  MRI of brain and EEG were ordered but never performed.  I also ordered neuropsychological testing, which also was never performed.    He started having daily headaches in August ***.  He took hydrocodone (prescribed for back pain), which was ineffective..  His orthopedist did not think it was coming from his neck.  MRI of brain without contrat on 08/02/2020 which showed mild chronic small vessel ischemic changes and small chronic lacunar infarcts in the right cerebellum and right middle cerebellar peduncle.    HISTORY: Since 2018, he has had trouble with balance. Since November 2019, it started to gradually get worse. When he stands and walks, he feels unsteady. His legs feel weak and will often give out under him. He has had several falls. He broke his right foot 3 weeks ago as a result of a fall. He also has had short term memory issues. He frequently forgets conversations and repeats questions. He used to handle the finances but his wife started paying the bills last year due to cognitive difficulties. Sometimes either arm will shake or jerk uncontrollably. It is not particularly related to any action or position of his arm. He denies pain in the legs. He has occasionally had urinary incontinence. He has past history of cervical spine surgery but currently denies any neck pain.  He had an MRI of the brain without contrast on 05/05/19 which was personally reviewed  anddemonstrated global cerebral atrophyand mild chronic small vessel ischemic changes.  MRI Cervical spine WO from 05/31/2019 personally reviewed and demonstrated previous ACDF at C3-4 as well as multilevel spondylosis with left-sided predominant osteophytes and disc herniation causing mild deformity of the left side of the cord.  NCV-EMG on lower extremities on 09/18/2019 was normal.  He also has trouble swallowing food.  He has history of throat cancer and had esophageal dilation.  In November 2020, he started having a coughing fit and he lost consciousness and had brief episode of shaking "like a seizure" for 2 to 3 minutes.  No postictal confusion.    He denies family history of Alzheimer's disease or other neurodegenerative disorders.  PAST MEDICAL HISTORY: Past Medical History:  Diagnosis Date  . Arthritis   . Asymptomatic LV dysfunction 10/18/2017  . Basal cell carcinoma   . Benign prostatic hyperplasia with incomplete bladder emptying 01/14/2019  . Cancer ([place])    throat - 1997, throat - 2018  . Cardiomyopathy, secondary ([place]) 12/01/2016   Overview:  EF 47% 12/26/16  . Chest pain syndrome 11/02/2017  . Chronic coronary artery disease 10/18/2017  . CKD (chronic kidney disease) stage 3, GFR 30-59 ml/min (HCC) 07/25/2019  . COPD (chronic obstructive pulmonary disease) ([place])   . Coronary artery disease   . Coronary artery disease involving native coronary artery of native heart with angina pectoris ([place]) 12/01/2016   Overview:  He has hx of IWMI in remote past, last cath in 2012 at [facility] showed chronic total occlusion of previously stented RCA, with good collaterals, a 40% LAD stenosis, inferior hypokinesis and  EF 45%.    He's been lost to Cardiology f/u since 2015, but has not had any recurrent events  . Depression   . Dyslipidemia 12/01/2016  . Emphysema lung ([place])   . Erectile dysfunction due to diseases classified elsewhere 06/12/2019  . Essential hypertension 10/18/2017  . GERD  (gastroesophageal reflux disease)   . Hoarseness 11/15/2016  . Hyperlipidemia   . Hypertension   . Lesion of vocal cord   . Leukoplakia of larynx 11/15/2016  . Malfunction of penile prosthesis ([place]) 01/14/2019  . Melanoma of back ([place])    melanoma on back  . Myocardial infarction ([place]) 2000   2 stents  . PAD (peripheral artery disease) ([place]) 10/18/2017  . Peripheral vascular disease (HCC)    iliac artery clot  . Peyronie's disease 06/12/2019  . Pharyngoesophageal dysphagia 08/12/2018  . Pneumonia 02/2015  . Preoperative clearance 04/03/2018  . Squamous cell carcinoma of larynx ([place]) 12/13/2016  . Wears glasses     MEDICATIONS: Current Outpatient Medications on File Prior to Visit  Medication Sig Dispense Refill  . acetaminophen (TYLENOL) 325 MG tablet Take by mouth.    . ALPRAZolam (XANAX) 0.25 MG tablet TAKE 1 TABLET BY MOUTH ONCE DAILY AS NEEDED FOR ANXIETY 30 tablet 2  . amLODipine (NORVASC) 5 MG tablet TAKE 1 TABLET BY MOUTH ONCE DAILY . APPOINTMENT REQUIRED FOR FUTURE REFILLS 30 tablet 0  . aspirin 81 MG tablet Take 162 mg by mouth daily.     Marland Kitchen BREO ELLIPTA 100-25 MCG/INH AEPB INHALE 1 PUFF BY MOUTH ONCE DAILY    . clopidogrel (PLAVIX) 75 MG tablet Take 1 tablet by mouth once daily 90 tablet 2  . cyclobenzaprine (FLEXERIL) 10 MG tablet TAKE 1 TABLET BY MOUTH THREE TIMES DAILY AS NEEDED FOR  BACK  SPASMS 90 tablet 2  . DULoxetine (CYMBALTA) 60 MG capsule Take 1 capsule by mouth twice daily 180 capsule 0  . gabapentin (NEURONTIN) 400 MG capsule Take 1 capsule (400 mg total) by mouth 3 (three) times daily. 90 capsule 1  . HYDROcodone-acetaminophen (NORCO/VICODIN) 5-325 MG tablet Take 1 tablet by mouth every 6 (six) hours as needed for moderate pain. 120 tablet 0  . ipratropium-albuterol (DUONEB) 0.5-2.5 (3) MG/3ML SOLN Take 3 mLs by nebulization every 6 (six) hours as needed (for shortness of breath/wheezing. (with bronchitis)).    . Lancets (ONETOUCH DELICA PLUS [id number]) MISC USE 1 UNIT  TO CHECK GLUCOSE THREE TIMES DAILY    . lisinopril-hydrochlorothiazide (ZESTORETIC) 10-12.5 MG tablet Take 1 tablet by mouth daily. 90 tablet 0  . metFORMIN (GLUCOPHAGE) 500 MG tablet Take 1 tablet by mouth twice daily 180 tablet 0  . metoprolol tartrate (LOPRESSOR) 50 MG tablet Take 1 tablet (50 mg total) by mouth 2 (two) times daily. 180 tablet 2  . nitroGLYCERIN (NITROSTAT) 0.4 MG SL tablet Place 0.4 mg under the tongue every 6 (six) hours.    Marland Kitchen nystatin cream (MYCOSTATIN) MIX EQUAL PARTS OF NYSTATIN CREAM AND TRIAMCINOLONE CREAM AND APPLY TO AFFECTED AREA TWICE DAILY FOR 2 WEEKS    . omeprazole (PRILOSEC) 20 MG capsule Take 1 capsule (20 mg total) by mouth daily. 90 capsule 3  . ONETOUCH ULTRA test strip USE 1 STRIP TO CHECK GLUCOSE TWICE DAILY 200 each 3  . rosuvastatin (CRESTOR) 40 MG tablet Take 1 tablet (40 mg total) by mouth daily. 90 tablet 0  . SPIRIVA RESPIMAT 2.5 MCG/ACT AERS Inhale 2 puffs into the lungs daily.     . tamsulosin (FLOMAX) 0.4  MG CAPS capsule TAKE 1 CAPSULE BY MOUTH ONCE DAILY AT BEDTIME    . TOUJEO SOLOSTAR 300 UNIT/ML Solostar Pen Inject 30 Units into the skin daily. 6 pen 0  . traZODone (DESYREL) 150 MG tablet Take 1 tablet (150 mg total) by mouth daily. 90 tablet 1  . VENTOLIN HFA 108 (90 Base) MCG/ACT inhaler prn  0   No current facility-administered medications on file prior to visit.    ALLERGIES: Allergies  Allergen Reactions  . Penicillins Rash and Hives    Has patient had a PCN reaction causing immediate rash, facial/tongue/throat swelling, SOB or lightheadedness with hypotension:unsure Has patient had a PCN reaction causing severe rash involving mucus membranes or skin necrosis:unsure Has patient had a PCN reaction that required hospitalization:No Has patient had a PCN reaction occurring within the last 10 years:No If all of the above answers are "NO", then may proceed with Cephalosporin use.  rash  . Prednisone     Depressed mood - can tolerate  it, but it makes him very irritable  . Doxycycline Rash  . Iodinated Diagnostic Agents Rash    Also developed blisters Also developed blisters    FAMILY HISTORY: Family History  Problem Relation Age of Onset  . Cancer Mother        bone  . Stroke Father   . Healthy Child   . Cancer Other        brain  . Diabetes Other    ***.  SOCIAL HISTORY: Social History   Socioeconomic History  . Marital status: Married    Spouse name: Not on file  . Number of children: 4  . Years of education: Not on file  . Highest education level: GED or equivalent  Occupational History  . Occupation: retired  Tobacco Use  . Smoking status: Current Every Day Smoker    Packs/day: 1.00    Years: 50.00    Pack years: 50.00    Types: Cigarettes  . Smokeless tobacco: Never Used  . Tobacco comment: down to .5 ppd  Vaping Use  . Vaping Use: Former  Substance and Sexual Activity  . Alcohol use: No    Alcohol/week: 0.0 standard drinks  . Drug use: No  . Sexual activity: Not on file  Other Topics Concern  . Not on file  Social History Narrative   Lives with wife       One level      Right hand   Social Determinants of Health   Financial Resource Strain:   . Difficulty of Paying Living Expenses: Not on file  Food Insecurity:   . Worried About Charity fundraiser in the Last Year: Not on file  . Ran Out of Food in the Last Year: Not on file  Transportation Needs:   . Lack of Transportation (Medical): Not on file  . Lack of Transportation (Non-Medical): Not on file  Physical Activity:   . Days of Exercise per Week: Not on file  . Minutes of Exercise per Session: Not on file  Stress:   . Feeling of Stress : Not on file  Social Connections:   . Frequency of Communication with Friends and Family: Not on file  . Frequency of Social Gatherings with Friends and Family: Not on file  . Attends Religious Services: Not on file  . Active Member of Clubs or Organizations: Not on file  .  Attends Archivist Meetings: Not on file  . Marital Status: Not on file  Intimate  Partner Violence:   . Fear of Current or Ex-Partner: Not on file  . Emotionally Abused: Not on file  . Physically Abused: Not on file  . Sexually Abused: Not on file    REVIEW OF SYSTEMS: Constitutional: No fevers, chills, or sweats, no generalized fatigue, change in appetite Eyes: No visual changes, double vision, eye pain Ear, nose and throat: No hearing loss, ear pain, nasal congestion, sore throat Cardiovascular: No chest pain, palpitations Respiratory:  No shortness of breath at rest or with exertion, wheezes GastrointestinaI: No nausea, vomiting, diarrhea, abdominal pain, fecal incontinence Genitourinary:  No dysuria, urinary retention or frequency Musculoskeletal:  No neck pain, back pain Integumentary: No rash, pruritus, skin lesions Neurological: as above Psychiatric: No depression, insomnia, anxiety Endocrine: No palpitations, fatigue, diaphoresis, mood swings, change in appetite, change in weight, increased thirst Hematologic/Lymphatic:  No purpura, petechiae. Allergic/Immunologic: no itchy/runny eyes, nasal congestion, recent allergic reactions, rashes  PHYSICAL EXAM: *** General: No acute distress.  Patient appears ***-groomed.   Head:  Normocephalic/atraumatic Eyes:  Fundi examined but not visualized Neck: supple, no paraspinal tenderness, full range of motion Heart:  Regular rate and rhythm Lungs:  Clear to auscultation bilaterally Back: No paraspinal tenderness Neurological Exam: alert and oriented to person, place, and time. Attention span and concentration intact, recent and remote memory intact, fund of knowledge intact.  Speech fluent and not dysarthric, language intact.  CN II-XII intact. Bulk and tone normal, muscle strength 5/5 throughout.  Sensation to light touch, temperature and vibration intact.  Deep tendon reflexes 2+ throughout, toes downgoing.  Finger to nose  and heel to shin testing intact.  Gait normal, Romberg negative.  IMPRESSION: 1.  Left sided cervicogenic headache.  I do believe his left-sided headache is cervicogenic in nature.  MRI of cervical spine from July 2020 demonstrated facet osteoarthritis on the left at C2-3 with foraminal narrowing that could affect the left C3 nerve. 2.  ***  PLAN: ***  Metta Clines, DO  CC: [name], MD

## 2020-09-09 ENCOUNTER — Other Ambulatory Visit: Payer: Self-pay | Admitting: Family Medicine

## 2020-09-09 DIAGNOSIS — L570 Actinic keratosis: Secondary | ICD-10-CM | POA: Diagnosis not present

## 2020-09-09 DIAGNOSIS — L219 Seborrheic dermatitis, unspecified: Secondary | ICD-10-CM | POA: Diagnosis not present

## 2020-09-09 DIAGNOSIS — Z85820 Personal history of malignant melanoma of skin: Secondary | ICD-10-CM | POA: Diagnosis not present

## 2020-09-09 DIAGNOSIS — Z1152 Encounter for screening for COVID-19: Secondary | ICD-10-CM | POA: Diagnosis not present

## 2020-09-09 DIAGNOSIS — Z1159 Encounter for screening for other viral diseases: Secondary | ICD-10-CM | POA: Diagnosis not present

## 2020-09-10 ENCOUNTER — Ambulatory Visit: Payer: Medicare Other | Admitting: Neurology

## 2020-09-10 ENCOUNTER — Telehealth (INDEPENDENT_AMBULATORY_CARE_PROVIDER_SITE_OTHER): Payer: Medicare Other | Admitting: Family Medicine

## 2020-09-10 VITALS — BP 127/70 | HR 65 | Temp 98.0°F

## 2020-09-10 DIAGNOSIS — J018 Other acute sinusitis: Secondary | ICD-10-CM | POA: Diagnosis not present

## 2020-09-10 MED ORDER — AZITHROMYCIN 500 MG PO TABS: ORAL_TABLET | ORAL | 0 refills | Status: DC

## 2020-09-10 NOTE — Progress Notes (Signed)
Virtual Visit via Telephone Note   This visit type was conducted due to national recommendations for restrictions regarding the COVID-19 Pandemic (e.g. social distancing) in an effort to limit this patient's exposure and mitigate transmission in our community.  Due to his co-morbid illnesses, this patient is at least at moderate risk for complications without adequate follow up.  This format is felt to be most appropriate for this patient at this time.  The patient did not have access to video technology/had technical difficulties with video requiring transitioning to audio format only (telephone).  All issues noted in this document were discussed and addressed.  No physical exam could be performed with this format.  Patient verbally consented to a telehealth visit.   Date:  09/11/2020   ID:  Don [name], DOB 14-Sep-1951, MRN [id number]  Patient Location: Home Provider Location: Office/Clinic  PCP:  [name], MD   Evaluation Performed: acute Chief Complaint:  Cough   History of Present Illness:    Don [name] is a 69 y.o. male with productive green cough since 3 days. Covid negative yesterday.  Some sore throat due to coughing. Some shortness of breath and wheezing, but it was his baseline. Some headaches and weakness.  The patient does have symptoms concerning for COVID-19 infection (fever, chills, cough, or new shortness of breath).    Past Medical History:  Diagnosis Date  . Arthritis   . Asymptomatic LV dysfunction 10/18/2017  . Basal cell carcinoma   . Benign prostatic hyperplasia with incomplete bladder emptying 01/14/2019  . Cancer ([place])    throat - 1997, throat - 2018  . Cardiomyopathy, secondary ([place]) 12/01/2016   Overview:  EF 47% 12/26/16  . Chest pain syndrome 11/02/2017  . Chronic coronary artery disease 10/18/2017  . CKD (chronic kidney disease) stage 3, GFR 30-59 ml/min (HCC) 07/25/2019  . COPD (chronic obstructive pulmonary disease) ([place])   .  Coronary artery disease   . Coronary artery disease involving native coronary artery of native heart with angina pectoris ([place]) 12/01/2016   Overview:  He has hx of IWMI in remote past, last cath in 2012 at [facility] showed chronic total occlusion of previously stented RCA, with good collaterals, a 40% LAD stenosis, inferior hypokinesis and EF 45%.    He's been lost to Cardiology f/u since 2015, but has not had any recurrent events  . Depression   . Dyslipidemia 12/01/2016  . Emphysema lung ([place])   . Erectile dysfunction due to diseases classified elsewhere 06/12/2019  . Essential hypertension 10/18/2017  . GERD (gastroesophageal reflux disease)   . Hoarseness 11/15/2016  . Hyperlipidemia   . Hypertension   . Lesion of vocal cord   . Leukoplakia of larynx 11/15/2016  . Malfunction of penile prosthesis ([place]) 01/14/2019  . Melanoma of back ([place])    melanoma on back  . Myocardial infarction ([place]) 2000   2 stents  . PAD (peripheral artery disease) ([place]) 10/18/2017  . Peripheral vascular disease (HCC)    iliac artery clot  . Peyronie's disease 06/12/2019  . Pharyngoesophageal dysphagia 08/12/2018  . Pneumonia 02/2015  . Preoperative clearance 04/03/2018  . Squamous cell carcinoma of larynx ([place]) 12/13/2016  . Wears glasses     Past Surgical History:  Procedure Laterality Date  . ABDOMINAL ANGIOGRAM N/A 01/13/2015   Procedure: ABDOMINAL ANGIOGRAM;  Surgeon: Rosetta Posner, MD;  Location: [facility] CATH LAB;  Service: Cardiovascular;  Laterality: N/A;  . APPENDECTOMY    . BACK SURGERY    .  CARDIAC CATHETERIZATION  2000/2012   with stents in 2000  . CHOLECYSTECTOMY    . COLONOSCOPY    . ELBOW SURGERY     bilateral  . ESOPHAGOGASTRODUODENOSCOPY    . KNEE SURGERY Left   . MICROLARYNGOSCOPY WITH CO2 LASER AND EXCISION OF VOCAL CORD LESION N/A 11/23/2016   Procedure: MICROLARYNGOSCOPY  AND EXCISION OF VOCAL CORD LESION;  Surgeon: Melissa Montane, MD;  Location: [place];  Service: ENT;  Laterality: N/A;  . MICROLARYNGOSCOPY  WITH CO2 LASER AND EXCISION OF VOCAL CORD LESION N/A 01/11/2017   Procedure: MICROLARYNGOSCOPY WITH CO2 LASER AND EXCISION OF VOCAL CORD LESION;  Surgeon: Melissa Montane, MD;  Location: [place];  Service: ENT;  Laterality: N/A;  . MICROLARYNGOSCOPY WITH LASER N/A 03/16/2015   Procedure: MICROLARYNGOSCOPY ;  Surgeon: Melissa Montane, MD;  Location: [place];  Service: ENT;  Laterality: N/A;  . peyronie's surgery    . SHOULDER SURGERY Right    rotator cuff  . THROAT SURGERY  1997   cancer removed    Family History  Problem Relation Age of Onset  . Cancer Mother        bone  . Stroke Father   . Healthy Child   . Cancer Other        brain  . Diabetes Other     Social History   Socioeconomic History  . Marital status: Married    Spouse name: Not on file  . Number of children: 4  . Years of education: Not on file  . Highest education level: GED or equivalent  Occupational History  . Occupation: retired  Tobacco Use  . Smoking status: Current Every Day Smoker    Packs/day: 1.00    Years: 50.00    Pack years: 50.00    Types: Cigarettes  . Smokeless tobacco: Never Used  . Tobacco comment: down to .5 ppd  Vaping Use  . Vaping Use: Former  Substance and Sexual Activity  . Alcohol use: No    Alcohol/week: 0.0 standard drinks  . Drug use: No  . Sexual activity: Not on file  Other Topics Concern  . Not on file  Social History Narrative   Lives with wife       One level      Right hand   Social Determinants of Health   Financial Resource Strain:   . Difficulty of Paying Living Expenses: Not on file  Food Insecurity:   . Worried About Charity fundraiser in the Last Year: Not on file  . Ran Out of Food in the Last Year: Not on file  Transportation Needs:   . Lack of Transportation (Medical): Not on file  . Lack of Transportation (Non-Medical): Not on file  Physical Activity:   . Days of Exercise per Week: Not on file  . Minutes of Exercise per Session: Not on file  Stress:   .  Feeling of Stress : Not on file  Social Connections:   . Frequency of Communication with Friends and Family: Not on file  . Frequency of Social Gatherings with Friends and Family: Not on file  . Attends Religious Services: Not on file  . Active Member of Clubs or Organizations: Not on file  . Attends Archivist Meetings: Not on file  . Marital Status: Not on file  Intimate Partner Violence:   . Fear of Current or Ex-Partner: Not on file  . Emotionally Abused: Not on file  . Physically Abused: Not on file  .  Sexually Abused: Not on file    Outpatient Medications Prior to Visit  Medication Sig Dispense Refill  . acetaminophen (TYLENOL) 325 MG tablet Take by mouth.    . ALPRAZolam (XANAX) 0.25 MG tablet TAKE 1 TABLET BY MOUTH ONCE DAILY AS NEEDED FOR ANXIETY 30 tablet 2  . amLODipine (NORVASC) 5 MG tablet TAKE 1 TABLET BY MOUTH ONCE DAILY . APPOINTMENT REQUIRED FOR FUTURE REFILLS 30 tablet 0  . aspirin 81 MG tablet Take 162 mg by mouth daily.     Marland Kitchen BREO ELLIPTA 100-25 MCG/INH AEPB INHALE 1 PUFF BY MOUTH ONCE DAILY    . clopidogrel (PLAVIX) 75 MG tablet Take 1 tablet by mouth once daily 90 tablet 2  . cyclobenzaprine (FLEXERIL) 10 MG tablet TAKE 1 TABLET BY MOUTH THREE TIMES DAILY AS NEEDED FOR  BACK  SPASMS 90 tablet 2  . DULoxetine (CYMBALTA) 60 MG capsule Take 1 capsule by mouth twice daily 180 capsule 0  . gabapentin (NEURONTIN) 400 MG capsule Take 1 capsule (400 mg total) by mouth 3 (three) times daily. 90 capsule 1  . HYDROcodone-acetaminophen (NORCO/VICODIN) 5-325 MG tablet Take 1 tablet by mouth every 6 (six) hours as needed for moderate pain. 120 tablet 0  . ipratropium-albuterol (DUONEB) 0.5-2.5 (3) MG/3ML SOLN Take 3 mLs by nebulization every 6 (six) hours as needed (for shortness of breath/wheezing. (with bronchitis)).    . Lancets (ONETOUCH DELICA PLUS [id number]) MISC USE 1 UNIT TO CHECK GLUCOSE THREE TIMES DAILY    . lisinopril-hydrochlorothiazide (ZESTORETIC) 10-12.5  MG tablet Take 1 tablet by mouth daily. 90 tablet 0  . metFORMIN (GLUCOPHAGE) 500 MG tablet Take 1 tablet by mouth twice daily 180 tablet 0  . metoprolol tartrate (LOPRESSOR) 50 MG tablet Take 1 tablet (50 mg total) by mouth 2 (two) times daily. 180 tablet 2  . nitroGLYCERIN (NITROSTAT) 0.4 MG SL tablet Place 0.4 mg under the tongue every 6 (six) hours.    Marland Kitchen nystatin cream (MYCOSTATIN) MIX EQUAL PARTS OF NYSTATIN CREAM AND TRIAMCINOLONE CREAM AND APPLY TO AFFECTED AREA TWICE DAILY FOR 2 WEEKS    . omeprazole (PRILOSEC) 20 MG capsule Take 1 capsule (20 mg total) by mouth daily. 90 capsule 3  . ONETOUCH ULTRA test strip USE 1 STRIP TO CHECK GLUCOSE TWICE DAILY 200 each 3  . rosuvastatin (CRESTOR) 40 MG tablet Take 1 tablet (40 mg total) by mouth daily. 90 tablet 0  . SPIRIVA RESPIMAT 2.5 MCG/ACT AERS Inhale 2 puffs into the lungs daily.     . tamsulosin (FLOMAX) 0.4 MG CAPS capsule TAKE 1 CAPSULE BY MOUTH ONCE DAILY AT BEDTIME    . TOUJEO SOLOSTAR 300 UNIT/ML Solostar Pen INJECT 30 UNITS SUBCUTANEOUSLY ONCE DAILY 6 mL 0  . traZODone (DESYREL) 150 MG tablet Take 1 tablet (150 mg total) by mouth daily. 90 tablet 1  . VENTOLIN HFA 108 (90 Base) MCG/ACT inhaler prn  0   No facility-administered medications prior to visit.    Allergies:   Penicillins, Prednisone, Doxycycline, and Iodinated diagnostic agents   Social History   Tobacco Use  . Smoking status: Current Every Day Smoker    Packs/day: 1.00    Years: 50.00    Pack years: 50.00    Types: Cigarettes  . Smokeless tobacco: Never Used  . Tobacco comment: down to .5 ppd  Vaping Use  . Vaping Use: Former  Substance Use Topics  . Alcohol use: No    Alcohol/week: 0.0 standard drinks  . Drug use: No  Review of Systems  Constitutional: Positive for malaise/fatigue. Negative for chills and fever.  HENT: Negative for ear pain, sinus pain and sore throat.   Respiratory: Positive for cough, shortness of breath and wheezing.     Cardiovascular: Negative for chest pain and palpitations.  Gastrointestinal: Negative for abdominal pain, constipation, diarrhea and vomiting.  Genitourinary: Negative for dysuria.  Musculoskeletal: Positive for neck pain.  Neurological: Positive for weakness and headaches.     Labs/Other Tests and Data Reviewed:    Recent Labs: 03/18/2020: Magnesium 1.7; TSH 2.060   Recent Lipid Panel No results found for: CHOL, TRIG, HDL, CHOLHDL, LDLCALC, LDLDIRECT  Wt Readings from Last 3 Encounters:  08/23/20 195 lb 9.6 oz (88.7 kg)  08/02/20 200 lb (90.7 kg)  07/15/20 200 lb (90.7 kg)     Objective:    Vital Signs:  BP 127/70   Pulse 65   Temp 98 F (36.7 C)   SpO2 94%    Physical Exam  Patient does not sound sob on the phone. A wet cough.  ASSESSMENT & PLAN:   1. Acute non-recurrent sinusitis of other sinus - azithromycin (ZITHROMAX) 500 MG tablet; One daily x 3 days.  Dispense: 3 each; Refill: 0  If worsens, pt should call. I would like to avoid prednisone as he has an upcoming neck surgery next week.   Meds ordered this encounter  Medications  . azithromycin (ZITHROMAX) 500 MG tablet    Sig: One daily x 3 days.    Dispense:  3 each    Refill:  0    COVID-19 Education: The signs and symptoms of COVID-19 were discussed with the patient and how to seek care for testing (follow up with PCP or arrange E-visit). The importance of social distancing was discussed today.  Time:   Today, I have spent 8 minutes with the patient with telehealth technology discussing the above problems.    Follow Up:  In Person prn  Signed, [name], MD  09/11/2020 8:18 PM    [place]

## 2020-09-11 ENCOUNTER — Encounter: Payer: Self-pay | Admitting: Family Medicine

## 2020-09-13 HISTORY — PX: SPINE SURGERY: SHX786

## 2020-09-14 ENCOUNTER — Ambulatory Visit: Payer: Medicare Other

## 2020-09-15 DIAGNOSIS — E1165 Type 2 diabetes mellitus with hyperglycemia: Secondary | ICD-10-CM | POA: Diagnosis not present

## 2020-09-15 DIAGNOSIS — M50122 Cervical disc disorder at C5-C6 level with radiculopathy: Secondary | ICD-10-CM | POA: Diagnosis not present

## 2020-09-15 DIAGNOSIS — F419 Anxiety disorder, unspecified: Secondary | ICD-10-CM | POA: Diagnosis not present

## 2020-09-15 DIAGNOSIS — N183 Chronic kidney disease, stage 3 unspecified: Secondary | ICD-10-CM | POA: Diagnosis not present

## 2020-09-15 DIAGNOSIS — M50123 Cervical disc disorder at C6-C7 level with radiculopathy: Secondary | ICD-10-CM | POA: Diagnosis not present

## 2020-09-15 DIAGNOSIS — Z8614 Personal history of Methicillin resistant Staphylococcus aureus infection: Secondary | ICD-10-CM | POA: Diagnosis not present

## 2020-09-15 DIAGNOSIS — Z8673 Personal history of transient ischemic attack (TIA), and cerebral infarction without residual deficits: Secondary | ICD-10-CM | POA: Diagnosis not present

## 2020-09-15 DIAGNOSIS — Z9181 History of falling: Secondary | ICD-10-CM | POA: Diagnosis not present

## 2020-09-15 DIAGNOSIS — I739 Peripheral vascular disease, unspecified: Secondary | ICD-10-CM | POA: Diagnosis not present

## 2020-09-15 DIAGNOSIS — M4802 Spinal stenosis, cervical region: Secondary | ICD-10-CM | POA: Diagnosis not present

## 2020-09-15 DIAGNOSIS — J449 Chronic obstructive pulmonary disease, unspecified: Secondary | ICD-10-CM | POA: Diagnosis not present

## 2020-09-15 DIAGNOSIS — H3530 Unspecified macular degeneration: Secondary | ICD-10-CM | POA: Diagnosis not present

## 2020-09-15 DIAGNOSIS — F17210 Nicotine dependence, cigarettes, uncomplicated: Secondary | ICD-10-CM | POA: Diagnosis not present

## 2020-09-15 DIAGNOSIS — M5412 Radiculopathy, cervical region: Secondary | ICD-10-CM | POA: Diagnosis not present

## 2020-09-15 DIAGNOSIS — K219 Gastro-esophageal reflux disease without esophagitis: Secondary | ICD-10-CM | POA: Diagnosis not present

## 2020-09-15 DIAGNOSIS — Z955 Presence of coronary angioplasty implant and graft: Secondary | ICD-10-CM | POA: Diagnosis not present

## 2020-09-15 DIAGNOSIS — I129 Hypertensive chronic kidney disease with stage 1 through stage 4 chronic kidney disease, or unspecified chronic kidney disease: Secondary | ICD-10-CM | POA: Diagnosis not present

## 2020-09-15 DIAGNOSIS — I252 Old myocardial infarction: Secondary | ICD-10-CM | POA: Diagnosis not present

## 2020-09-15 DIAGNOSIS — E1122 Type 2 diabetes mellitus with diabetic chronic kidney disease: Secondary | ICD-10-CM | POA: Diagnosis not present

## 2020-09-15 DIAGNOSIS — M48062 Spinal stenosis, lumbar region with neurogenic claudication: Secondary | ICD-10-CM | POA: Diagnosis not present

## 2020-09-20 ENCOUNTER — Telehealth: Payer: Self-pay

## 2020-09-20 NOTE — Telephone Encounter (Signed)
PA for Cyclobenzaprine submitted and approved via covermymeds.

## 2020-09-27 ENCOUNTER — Other Ambulatory Visit: Payer: Self-pay

## 2020-09-27 MED ORDER — HYDROCODONE-ACETAMINOPHEN 5-325 MG PO TABS: 1.0000 | ORAL_TABLET | Freq: Four times a day (QID) | ORAL | 0 refills | Status: DC | PRN

## 2020-10-09 DIAGNOSIS — R0902 Hypoxemia: Secondary | ICD-10-CM | POA: Diagnosis not present

## 2020-10-12 ENCOUNTER — Other Ambulatory Visit: Payer: Self-pay

## 2020-10-12 ENCOUNTER — Ambulatory Visit (INDEPENDENT_AMBULATORY_CARE_PROVIDER_SITE_OTHER): Payer: Medicare Other

## 2020-10-12 ENCOUNTER — Ambulatory Visit: Payer: Medicare Other

## 2020-10-12 DIAGNOSIS — Z23 Encounter for immunization: Secondary | ICD-10-CM | POA: Diagnosis not present

## 2020-10-12 NOTE — Progress Notes (Signed)
   Covid-19 Vaccination Clinic  Name:  [name]    MRN: [id number] DOB: Apr 07, 1951  10/12/2020  [name] was observed post Covid-19 immunization for 15 minutes without incident. He was provided with Vaccine Information Sheet and instruction to access the V-Safe system.   [name] was instructed to call 911 with any severe reactions post vaccine: Marland Kitchen Difficulty breathing  . Swelling of face and throat  . A fast heartbeat  . A bad rash all over body  . Dizziness and weakness

## 2020-10-13 DIAGNOSIS — S8254XA Nondisplaced fracture of medial malleolus of right tibia, initial encounter for closed fracture: Secondary | ICD-10-CM | POA: Diagnosis not present

## 2020-10-18 ENCOUNTER — Other Ambulatory Visit: Payer: Self-pay | Admitting: Family Medicine

## 2020-10-20 DIAGNOSIS — S8254XA Nondisplaced fracture of medial malleolus of right tibia, initial encounter for closed fracture: Secondary | ICD-10-CM | POA: Diagnosis not present

## 2020-10-25 ENCOUNTER — Other Ambulatory Visit: Payer: Self-pay | Admitting: Family Medicine

## 2020-11-02 ENCOUNTER — Telehealth: Payer: Self-pay

## 2020-11-02 ENCOUNTER — Other Ambulatory Visit: Payer: Self-pay

## 2020-11-02 MED ORDER — HYDROCODONE-ACETAMINOPHEN 5-325 MG PO TABS: 1.0000 | ORAL_TABLET | Freq: Four times a day (QID) | ORAL | 0 refills | Status: DC | PRN

## 2020-11-03 NOTE — Telephone Encounter (Signed)
error 

## 2020-11-07 NOTE — Progress Notes (Deleted)
NEUROLOGY FOLLOW UP OFFICE NOTE  JAMARQUEZ DALTO [id number]   Subjective:  Don [name] is a 69 year old Caucasian man with hypertension, CAD, COPD, peripheral vascular disease, arthritis and history of throat cancer, melanoma and throat cancer who follows up for cognitive deficits and unsteady gait. History supplemented by referring provider note. He is accompanied by his wife who supplements history.  UPDATE: Last seen in November 2020.  At that time, he had a seizure-like episode.  I ordered MRI of brain, EEG and neuropsychological testing.  None of these tests were performed.  ***  ***.  His PCP ordered an MRI of the brain which was performed on 08/02/2020 which showed remote lacunar infarcts in the right cerebellum and right middle cerebellar peduncle.  As these findings were not seen on his previous MRI from June 2020, he underwent stroke workup.  2D echocardiogram on 08/13/2020 showed EF 55-60% ***.  Carotid duplex showed no hemodynamically significant ICA stenosis bilaterally (<50%) and showed antegrade flow of both vertebral arteries.  Hgb A1c was 6.6.     HISTORY: Since 2018, he has had trouble with balance. Since November 2019, it started to gradually get worse. When he stands and walks, he feels unsteady. His legs feel weak and will often give out under him. He has had several falls. He broke his right foot 3 weeks ago as a result of a fall. He also has had short term memory issues. He frequently forgets conversations and repeats questions. He used to handle the finances but his wife started paying the bills last year due to cognitive difficulties. Sometimes either arm will shake or jerk uncontrollably. It is not particularly related to any action or position of his arm. He denies pain in the legs. He has occasionally had urinary incontinence. One time, he had a coughing fit and he lost consciousness and had brief episode of shaking "like a seizure" for 2 to 3  minutes.  No postictal confusion. He has past history of cervical spine surgery but currently denies any neck pain.  He also has trouble swallowing food.  He has history of throat cancer and had esophageal dilation.    He had an MRI of the brain without contrast on 05/05/19 which was personally reviewed anddemonstrated global cerebral atrophyand mild chronic small vessel ischemic changes.  MRI Cervical spine WO from 05/31/2019 personally reviewed and demonstrated previous ACDF at C3-4 as well as multilevel spondylosis with left-sided predominant osteophytes and disc herniation causing mild deformity of the left side of the cord.  NCV-EMG on lower extremities on 09/18/2019 was normal.    He denies family history of Alzheimer's disease or other neurodegenerative disorders.  PAST MEDICAL HISTORY: Past Medical History:  Diagnosis Date  . Arthritis   . Asymptomatic LV dysfunction 10/18/2017  . Basal cell carcinoma   . Benign prostatic hyperplasia with incomplete bladder emptying 01/14/2019  . Cancer ([place])    throat - 1997, throat - 2018  . Cardiomyopathy, secondary ([place]) 12/01/2016   Overview:  EF 47% 12/26/16  . Chest pain syndrome 11/02/2017  . Chronic coronary artery disease 10/18/2017  . CKD (chronic kidney disease) stage 3, GFR 30-59 ml/min (HCC) 07/25/2019  . COPD (chronic obstructive pulmonary disease) ([place])   . Coronary artery disease   . Coronary artery disease involving native coronary artery of native heart with angina pectoris ([place]) 12/01/2016   Overview:  He has hx of IWMI in remote past, last cath in 2012 at [facility] showed chronic total occlusion  of previously stented RCA, with good collaterals, a 40% LAD stenosis, inferior hypokinesis and EF 45%.    He's been lost to Cardiology f/u since 2015, but has not had any recurrent events  . Depression   . Dyslipidemia 12/01/2016  . Emphysema lung ([place])   . Erectile dysfunction due to diseases classified elsewhere 06/12/2019  . Essential hypertension  10/18/2017  . GERD (gastroesophageal reflux disease)   . Hoarseness 11/15/2016  . Hyperlipidemia   . Hypertension   . Lesion of vocal cord   . Leukoplakia of larynx 11/15/2016  . Malfunction of penile prosthesis ([place]) 01/14/2019  . Melanoma of back ([place])    melanoma on back  . Myocardial infarction ([place]) 2000   2 stents  . PAD (peripheral artery disease) ([place]) 10/18/2017  . Peripheral vascular disease (HCC)    iliac artery clot  . Peyronie's disease 06/12/2019  . Pharyngoesophageal dysphagia 08/12/2018  . Pneumonia 02/2015  . Preoperative clearance 04/03/2018  . Squamous cell carcinoma of larynx ([place]) 12/13/2016  . Wears glasses     MEDICATIONS: Current Outpatient Medications on File Prior to Visit  Medication Sig Dispense Refill  . acetaminophen (TYLENOL) 325 MG tablet Take by mouth.    . ALPRAZolam (XANAX) 0.25 MG tablet TAKE 1 TABLET BY MOUTH ONCE DAILY AS NEEDED FOR ANXIETY 30 tablet 2  . amLODipine (NORVASC) 5 MG tablet TAKE 1 TABLET BY MOUTH ONCE DAILY . APPOINTMENT REQUIRED FOR FUTURE REFILLS 30 tablet 0  . aspirin 81 MG tablet Take 162 mg by mouth daily.     Marland Kitchen azithromycin (ZITHROMAX) 500 MG tablet One daily x 3 days. 3 each 0  . BREO ELLIPTA 100-25 MCG/INH AEPB INHALE 1 PUFF BY MOUTH ONCE DAILY    . clopidogrel (PLAVIX) 75 MG tablet Take 1 tablet by mouth once daily 90 tablet 2  . cyclobenzaprine (FLEXERIL) 10 MG tablet TAKE 1 TABLET BY MOUTH THREE TIMES DAILY AS NEEDED FOR  BACK  SPASMS 90 tablet 2  . DULoxetine (CYMBALTA) 60 MG capsule Take 1 capsule by mouth twice daily 180 capsule 0  . gabapentin (NEURONTIN) 400 MG capsule TAKE 1 CAPSULE BY MOUTH THREE TIMES DAILY 90 capsule 2  . HYDROcodone-acetaminophen (NORCO/VICODIN) 5-325 MG tablet Take 1 tablet by mouth every 6 (six) hours as needed for moderate pain. 120 tablet 0  . ipratropium-albuterol (DUONEB) 0.5-2.5 (3) MG/3ML SOLN Take 3 mLs by nebulization every 6 (six) hours as needed (for shortness of breath/wheezing. (with  bronchitis)).    . Lancets (ONETOUCH DELICA PLUS [id number]) MISC USE 1 UNIT TO CHECK GLUCOSE THREE TIMES DAILY    . lisinopril-hydrochlorothiazide (ZESTORETIC) 10-12.5 MG tablet Take 1 tablet by mouth daily. 90 tablet 0  . metFORMIN (GLUCOPHAGE) 500 MG tablet Take 1 tablet by mouth twice daily 180 tablet 0  . metoprolol tartrate (LOPRESSOR) 50 MG tablet Take 1 tablet (50 mg total) by mouth 2 (two) times daily. 180 tablet 2  . nitroGLYCERIN (NITROSTAT) 0.4 MG SL tablet Place 0.4 mg under the tongue every 6 (six) hours.    Marland Kitchen nystatin cream (MYCOSTATIN) MIX EQUAL PARTS OF NYSTATIN CREAM AND TRIAMCINOLONE CREAM AND APPLY TO AFFECTED AREA TWICE DAILY FOR 2 WEEKS    . omeprazole (PRILOSEC) 20 MG capsule Take 1 capsule (20 mg total) by mouth daily. 90 capsule 3  . ONETOUCH ULTRA test strip USE 1 STRIP TO CHECK GLUCOSE TWICE DAILY 200 each 3  . rosuvastatin (CRESTOR) 40 MG tablet Take 1 tablet (40 mg total) by mouth daily.  90 tablet 0  . SPIRIVA RESPIMAT 2.5 MCG/ACT AERS Inhale 2 puffs into the lungs daily.     . tamsulosin (FLOMAX) 0.4 MG CAPS capsule TAKE 1 CAPSULE BY MOUTH ONCE DAILY AT BEDTIME    . TOUJEO SOLOSTAR 300 UNIT/ML Solostar Pen INJECT 30 UNITS SUBCUTANEOUSLY ONCE DAILY 6 mL 0  . traZODone (DESYREL) 150 MG tablet Take 1 tablet (150 mg total) by mouth daily. 90 tablet 1  . VENTOLIN HFA 108 (90 Base) MCG/ACT inhaler prn  0   No current facility-administered medications on file prior to visit.    ALLERGIES: Allergies  Allergen Reactions  . Penicillins Rash and Hives    Has patient had a PCN reaction causing immediate rash, facial/tongue/throat swelling, SOB or lightheadedness with hypotension:unsure Has patient had a PCN reaction causing severe rash involving mucus membranes or skin necrosis:unsure Has patient had a PCN reaction that required hospitalization:No Has patient had a PCN reaction occurring within the last 10 years:No If all of the above answers are "NO", then may proceed  with Cephalosporin use.  rash  . Prednisone     Depressed mood - can tolerate it, but it makes him very irritable  . Doxycycline Rash  . Iodinated Diagnostic Agents Rash    Also developed blisters Also developed blisters    FAMILY HISTORY: Family History  Problem Relation Age of Onset  . Cancer Mother        bone  . Stroke Father   . Healthy Child   . Cancer Other        brain  . Diabetes Other     SOCIAL HISTORY: Social History   Socioeconomic History  . Marital status: Married    Spouse name: Not on file  . Number of children: 4  . Years of education: Not on file  . Highest education level: GED or equivalent  Occupational History  . Occupation: retired  Tobacco Use  . Smoking status: Current Every Day Smoker    Packs/day: 1.00    Years: 50.00    Pack years: 50.00    Types: Cigarettes  . Smokeless tobacco: Never Used  . Tobacco comment: down to .5 ppd  Vaping Use  . Vaping Use: Former  Substance and Sexual Activity  . Alcohol use: No    Alcohol/week: 0.0 standard drinks  . Drug use: No  . Sexual activity: Not on file  Other Topics Concern  . Not on file  Social History Narrative   Lives with wife       One level      Right hand   Social Determinants of Health   Financial Resource Strain: Not on file  Food Insecurity: Not on file  Transportation Needs: Not on file  Physical Activity: Not on file  Stress: Not on file  Social Connections: Not on file  Intimate Partner Violence: Not on file     Objective:  *** General: No acute distress.  Patient appears ***-groomed.   Head:  Normocephalic/atraumatic Eyes:  Fundi examined but not visualized Neck: supple, no paraspinal tenderness, full range of motion Heart:  Regular rate and rhythm Lungs:  Clear to auscultation bilaterally Back: No paraspinal tenderness Neurological Exam: alert and oriented to person, place, and time. Attention span and concentration intact, recent and remote memory intact,  fund of knowledge intact.  Speech fluent and not dysarthric, language intact.  CN II-XII intact. Bulk and tone normal, muscle strength 5/5 throughout.  Sensation to light touch, temperature and vibration intact.  Deep tendon reflexes 2+ throughout, toes downgoing.  Finger to nose and heel to shin testing intact.  Gait normal, Romberg negative.   Assessment/Plan:   ***  Metta Clines, DO  CC: ***

## 2020-11-08 ENCOUNTER — Encounter: Payer: Self-pay | Admitting: Neurology

## 2020-11-08 ENCOUNTER — Ambulatory Visit: Payer: Medicare Other | Admitting: Neurology

## 2020-11-08 ENCOUNTER — Other Ambulatory Visit: Payer: Self-pay | Admitting: Family Medicine

## 2020-11-08 DIAGNOSIS — Z029 Encounter for administrative examinations, unspecified: Secondary | ICD-10-CM

## 2020-11-22 ENCOUNTER — Other Ambulatory Visit: Payer: Self-pay | Admitting: Legal Medicine

## 2020-11-22 NOTE — Telephone Encounter (Signed)
This is your patient lp 

## 2020-12-01 ENCOUNTER — Telehealth: Payer: Self-pay | Admitting: Cardiology

## 2020-12-01 ENCOUNTER — Ambulatory Visit (INDEPENDENT_AMBULATORY_CARE_PROVIDER_SITE_OTHER): Payer: Medicare Other | Admitting: Family Medicine

## 2020-12-01 ENCOUNTER — Other Ambulatory Visit: Payer: Self-pay

## 2020-12-01 ENCOUNTER — Encounter: Payer: Self-pay | Admitting: Family Medicine

## 2020-12-01 VITALS — BP 114/68 | HR 75 | Temp 97.2°F | Ht 70.0 in | Wt 177.0 lb

## 2020-12-01 DIAGNOSIS — M6281 Muscle weakness (generalized): Secondary | ICD-10-CM | POA: Diagnosis not present

## 2020-12-01 DIAGNOSIS — J441 Chronic obstructive pulmonary disease with (acute) exacerbation: Secondary | ICD-10-CM

## 2020-12-01 DIAGNOSIS — R296 Repeated falls: Secondary | ICD-10-CM | POA: Diagnosis not present

## 2020-12-01 DIAGNOSIS — R1312 Dysphagia, oropharyngeal phase: Secondary | ICD-10-CM

## 2020-12-01 DIAGNOSIS — R29898 Other symptoms and signs involving the musculoskeletal system: Secondary | ICD-10-CM

## 2020-12-01 DIAGNOSIS — J018 Other acute sinusitis: Secondary | ICD-10-CM

## 2020-12-01 DIAGNOSIS — R269 Unspecified abnormalities of gait and mobility: Secondary | ICD-10-CM

## 2020-12-01 NOTE — Progress Notes (Signed)
Subjective:  Patient ID: Don [name], male    DOB: [birth year]  Age: 70 y.o. MRN: [id number]  Chief Complaint  Patient presents with  . Fall    HPI C/o numerous falls. States his legs are like jello and is unable to tell when his legs are going to give out. At times in the past has blacked out prior to falling. Patient fell yesterday, reports falling even with assistance.  Pt is afraid to ambulate. He had neck surgery in November, but his orthopedic doctor says it is not related to his neck. He is in wheelchair.    Current Outpatient Medications on File Prior to Visit  Medication Sig Dispense Refill  . acetaminophen (TYLENOL) 325 MG tablet Take by mouth.    . ALPRAZolam (XANAX) 0.25 MG tablet TAKE 1 TABLET BY MOUTH ONCE DAILY AS NEEDED FOR ANXIETY 30 tablet 0  . amLODipine (NORVASC) 5 MG tablet TAKE 1 TABLET BY MOUTH ONCE DAILY . APPOINTMENT REQUIRED FOR FUTURE REFILLS 30 tablet 0  . aspirin 81 MG tablet Take 162 mg by mouth daily.     Marland Kitchen BREO ELLIPTA 100-25 MCG/INH AEPB INHALE 1 PUFF BY MOUTH ONCE DAILY    . clopidogrel (PLAVIX) 75 MG tablet Take 1 tablet by mouth once daily 90 tablet 2  . cyclobenzaprine (FLEXERIL) 10 MG tablet TAKE 1 TABLET BY MOUTH THREE TIMES DAILY AS NEEDED FOR  BACK  SPASMS 90 tablet 0  . DULoxetine (CYMBALTA) 60 MG capsule Take 1 capsule by mouth twice daily 180 capsule 0  . gabapentin (NEURONTIN) 400 MG capsule TAKE 1 CAPSULE BY MOUTH THREE TIMES DAILY 90 capsule 2  . HYDROcodone-acetaminophen (NORCO/VICODIN) 5-325 MG tablet Take 1 tablet by mouth every 6 (six) hours as needed for moderate pain. 120 tablet 0  . ipratropium-albuterol (DUONEB) 0.5-2.5 (3) MG/3ML SOLN Take 3 mLs by nebulization every 6 (six) hours as needed (for shortness of breath/wheezing. (with bronchitis)).    . Lancets (ONETOUCH DELICA PLUS [id number]) MISC USE 1 UNIT TO CHECK GLUCOSE THREE TIMES DAILY    . lisinopril-hydrochlorothiazide (ZESTORETIC) 10-12.5 MG tablet Take 1 tablet by mouth  daily. 90 tablet 0  . metFORMIN (GLUCOPHAGE) 500 MG tablet Take 1 tablet by mouth twice daily 180 tablet 0  . metoprolol tartrate (LOPRESSOR) 50 MG tablet Take 1 tablet (50 mg total) by mouth 2 (two) times daily. 180 tablet 2  . nitroGLYCERIN (NITROSTAT) 0.4 MG SL tablet Place 0.4 mg under the tongue every 6 (six) hours.    Marland Kitchen nystatin cream (MYCOSTATIN) MIX EQUAL PARTS OF NYSTATIN CREAM AND TRIAMCINOLONE CREAM AND APPLY TO AFFECTED AREA TWICE DAILY FOR 2 WEEKS    . omeprazole (PRILOSEC) 20 MG capsule Take 1 capsule (20 mg total) by mouth daily. 90 capsule 3  . ONETOUCH ULTRA test strip USE 1 STRIP TO CHECK GLUCOSE TWICE DAILY 200 each 3  . rosuvastatin (CRESTOR) 40 MG tablet Take 1 tablet (40 mg total) by mouth daily. 90 tablet 0  . SPIRIVA RESPIMAT 2.5 MCG/ACT AERS Inhale 2 puffs into the lungs daily.     . tamsulosin (FLOMAX) 0.4 MG CAPS capsule TAKE 1 CAPSULE BY MOUTH ONCE DAILY AT BEDTIME    . TOUJEO SOLOSTAR 300 UNIT/ML Solostar Pen INJECT 30 UNITS SUBCUTANEOUSLY ONCE DAILY 6 mL 0  . traZODone (DESYREL) 150 MG tablet Take 1 tablet by mouth once daily 90 tablet 2  . VENTOLIN HFA 108 (90 Base) MCG/ACT inhaler prn  0   No current facility-administered medications on  file prior to visit.   Past Medical History:  Diagnosis Date  . Arthritis   . Asymptomatic LV dysfunction 10/18/2017  . Basal cell carcinoma   . Benign prostatic hyperplasia with incomplete bladder emptying 01/14/2019  . Cancer ([place])    throat - 1997, throat - 2018  . Cardiomyopathy, secondary ([place]) 12/01/2016   Overview:  EF 47% 12/26/16  . Chest pain syndrome 11/02/2017  . Chronic coronary artery disease 10/18/2017  . CKD (chronic kidney disease) stage 3, GFR 30-59 ml/min (HCC) 07/25/2019  . COPD (chronic obstructive pulmonary disease) ([place])   . Coronary artery disease   . Coronary artery disease involving native coronary artery of native heart with angina pectoris ([place]) 12/01/2016   Overview:  He has hx of IWMI in remote  past, last cath in 2012 at [facility] showed chronic total occlusion of previously stented RCA, with good collaterals, a 40% LAD stenosis, inferior hypokinesis and EF 45%.    He's been lost to Cardiology f/u since 2015, but has not had any recurrent events  . Depression   . Dyslipidemia 12/01/2016  . Emphysema lung ([place])   . Erectile dysfunction due to diseases classified elsewhere 06/12/2019  . Essential hypertension 10/18/2017  . GERD (gastroesophageal reflux disease)   . Hoarseness 11/15/2016  . Hyperlipidemia   . Hypertension   . Lesion of vocal cord   . Leukoplakia of larynx 11/15/2016  . Malfunction of penile prosthesis ([place]) 01/14/2019  . Melanoma of back ([place])    melanoma on back  . Myocardial infarction ([place]) 2000   2 stents  . PAD (peripheral artery disease) ([place]) 10/18/2017  . Peripheral vascular disease (HCC)    iliac artery clot  . Peyronie's disease 06/12/2019  . Pharyngoesophageal dysphagia 08/12/2018  . Pneumonia 02/2015  . Preoperative clearance 04/03/2018  . Squamous cell carcinoma of larynx ([place]) 12/13/2016  . Wears glasses    Past Surgical History:  Procedure Laterality Date  . ABDOMINAL ANGIOGRAM N/A 01/13/2015   Procedure: ABDOMINAL ANGIOGRAM;  Surgeon: Rosetta Posner, MD;  Location: [facility] CATH LAB;  Service: Cardiovascular;  Laterality: N/A;  . APPENDECTOMY    . BACK SURGERY    . CARDIAC CATHETERIZATION  2000/2012   with stents in 2000  . CHOLECYSTECTOMY    . COLONOSCOPY    . ELBOW SURGERY     bilateral  . ESOPHAGOGASTRODUODENOSCOPY    . KNEE SURGERY Left   . MICROLARYNGOSCOPY WITH CO2 LASER AND EXCISION OF VOCAL CORD LESION N/A 11/23/2016   Procedure: MICROLARYNGOSCOPY  AND EXCISION OF VOCAL CORD LESION;  Surgeon: Melissa Montane, MD;  Location: [facility];  Service: ENT;  Laterality: N/A;  . MICROLARYNGOSCOPY WITH CO2 LASER AND EXCISION OF VOCAL CORD LESION N/A 01/11/2017   Procedure: MICROLARYNGOSCOPY WITH CO2 LASER AND EXCISION OF VOCAL CORD LESION;  Surgeon: Melissa Montane, MD;  Location: [facility];  Service: ENT;  Laterality: N/A;  . MICROLARYNGOSCOPY WITH LASER N/A 03/16/2015   Procedure: MICROLARYNGOSCOPY ;  Surgeon: Melissa Montane, MD;  Location: [facility];  Service: ENT;  Laterality: N/A;  . peyronie's surgery    . SHOULDER SURGERY Right    rotator cuff  . SPINE SURGERY  09/2020   cervical surgery. steel plate, bone spurs, allograft.  . THROAT SURGERY  1997   cancer removed    Family History  Problem Relation Age of Onset  . Cancer Mother        bone  . Stroke Father   . Healthy Child   . Cancer  Other        brain  . Diabetes Other    Social History   Socioeconomic History  . Marital status: Married    Spouse name: Not on file  . Number of children: 4  . Years of education: Not on file  . Highest education level: GED or equivalent  Occupational History  . Occupation: retired  Tobacco Use  . Smoking status: Current Every Day Smoker    Packs/day: 1.00    Years: 50.00    Pack years: 50.00    Types: Cigarettes  . Smokeless tobacco: Never Used  . Tobacco comment: down to .5 ppd  Vaping Use  . Vaping Use: Former  Substance and Sexual Activity  . Alcohol use: No    Alcohol/week: 0.0 standard drinks  . Drug use: No  . Sexual activity: Not on file  Other Topics Concern  . Not on file  Social History Narrative   Lives with wife       One level      Right hand   Social Determinants of Health   Financial Resource Strain: Not on file  Food Insecurity: Not on file  Transportation Needs: Not on file  Physical Activity: Not on file  Stress: Not on file  Social Connections: Not on file    Review of Systems  Constitutional: Positive for fatigue. Negative for chills, diaphoresis and fever.  HENT: Negative for congestion, ear pain and sore throat.   Respiratory: Positive for cough (baseline (brief cough)). Negative for shortness of breath.   Cardiovascular: Negative for chest pain and leg swelling.  Gastrointestinal: Negative for abdominal pain, constipation,  diarrhea, nausea and vomiting.       Dysphagia: Had his esophagus stretched previously. Getting choked on food and pills. Liquids also. Did a swallowing study with just liquids. [facility] ENT. He was set up for EGD/Colonoscopy, but then postponed due to lung ptx. Scheduled to return to Gastroenterology on February 8th, 2022.   Genitourinary: Negative for dysuria and urgency.       No urinary incontinece.   Neurological: Positive for dizziness (not spinning. "He just goes down." Has had 3 falls in the last month. ). Negative for headaches.     Objective:  BP 114/68   Pulse 75   Temp (!) 97.2 F (36.2 C)   Ht 5\' 10"  (1.778 m)   Wt 177 lb (80.3 kg)   SpO2 97%   BMI 25.40 kg/m   BP/Weight 12/01/2020 09/10/2020 13/06/6577  Systolic BP 469 629 528  Diastolic BP 68 70 72  Wt. (Lbs) 177 - 195.6  BMI 25.4 - 28.07    Physical Exam Vitals reviewed.  Constitutional:      Appearance: Normal appearance.  Neck:     Vascular: No carotid bruit.  Cardiovascular:     Rate and Rhythm: Normal rate and regular rhythm.     Heart sounds: Normal heart sounds.  Pulmonary:     Effort: Pulmonary effort is normal.     Breath sounds: Rhonchi (occasional.) present. No wheezing or rales.     Comments: Pulses decreased but present. Abdominal:     General: Bowel sounds are normal.     Palpations: Abdomen is soft.     Tenderness: There is no abdominal tenderness.  Musculoskeletal:        General: Tenderness (knees tender over patella which is due to having fallen on them yesterday. ) present. No deformity. Normal range of motion.     Comments: Strength in  legs 4/5 with hip flexion, knee extension and plantar and dorsiflexion. FROM of knees and hips without discomfort.    Skin:    General: Skin is warm and dry.  Neurological:     General: No focal deficit present.     Mental Status: He is alert and oriented to person, place, and time.     Cranial Nerves: No cranial nerve deficit.     Coordination:  Coordination normal (negative dysmetria. Negative rhomberg. Uses both arms to get up from chair - has to rock several times to get up. ).     Deep Tendon Reflexes: Reflexes normal.  Psychiatric:        Mood and Affect: Mood normal.        Behavior: Behavior normal.     Diabetic Foot Exam - Simple   No data filed      Lab Results  Component Value Date   WBC 12.2 (H) 01/10/2017   HGB 15.0 01/10/2017   HCT 43.4 01/10/2017   PLT 195 01/10/2017   GLUCOSE 125 (H) 01/10/2017   ALT 20 05/29/2007   AST 21 05/29/2007   NA 140 01/10/2017   K 4.2 01/10/2017   CL 106 01/10/2017   CREATININE 0.98 01/10/2017   BUN 16 01/10/2017   CO2 27 01/10/2017   TSH 2.060 03/18/2020   INR 1.05 03/16/2015   MICROALBUR 80 08/23/2020      Assessment & Plan:   1. Frequent falls - Ambulatory referral to Home Health  2. Weakness of both lower extremities - Ambulatory referral to Home Health  3. Abnormal gait due to muscle weakness - Ambulatory referral to [facility]  4. Oropharyngeal dysphagia Patient's wife to call Gastroenterology and reschedule appointment.   5. Acute non-recurrent sinusitis of other sinus - azithromycin (ZITHROMAX) 500 MG tablet; One daily x 3 days.  Dispense: 3 tablet; Refill: 0  6. COPD exacerbation ([place]) Wife called back on 12/02/2020 concerned he was starting to have a copd flare.  Sent Zpack. Continue inhalers. Hold off on prednisone. If worsens, will send prednisone.   Meds ordered this encounter  Medications  . azithromycin (ZITHROMAX) 500 MG tablet    Sig: One daily x 3 days.    Dispense:  3 tablet    Refill:  0    Orders Placed This Encounter  Procedures  . Ambulatory referral to Home Health    Follow-up: Return in about 4 weeks (around 12/29/2020).  An After Visit Summary was printed and given to the patient.  [name], MD [facility] [phone]

## 2020-12-01 NOTE — Telephone Encounter (Signed)
Previous patient of Dr. Bettina Gavia. Patient's wife states she recently stopped by the office to pick up paperwork that is to be completed in order for the patient to transfer from [facility] to [facility]. She states she needs assistance with filling it out.

## 2020-12-02 ENCOUNTER — Telehealth: Payer: Self-pay

## 2020-12-02 MED ORDER — AZITHROMYCIN 500 MG PO TABS: ORAL_TABLET | ORAL | 0 refills | Status: DC

## 2020-12-02 NOTE — Telephone Encounter (Signed)
Anne Ng called to report that Daxten's cough is worse His oxygen saturation has been dropping in the 80's.  Dr. Tobie Poet is going to send an antibiotic.

## 2020-12-06 ENCOUNTER — Encounter: Payer: Self-pay | Admitting: Family Medicine

## 2020-12-08 ENCOUNTER — Other Ambulatory Visit: Payer: Self-pay

## 2020-12-09 ENCOUNTER — Other Ambulatory Visit: Payer: Self-pay

## 2020-12-09 MED ORDER — TOUJEO SOLOSTAR 300 UNIT/ML ~~LOC~~ SOPN: PEN_INJECTOR | SUBCUTANEOUS | 0 refills | Status: DC

## 2020-12-09 MED ORDER — HYDROCODONE-ACETAMINOPHEN 5-325 MG PO TABS: 1.0000 | ORAL_TABLET | Freq: Four times a day (QID) | ORAL | 0 refills | Status: DC | PRN

## 2020-12-10 DIAGNOSIS — J439 Emphysema, unspecified: Secondary | ICD-10-CM | POA: Insufficient documentation

## 2020-12-10 DIAGNOSIS — I739 Peripheral vascular disease, unspecified: Secondary | ICD-10-CM | POA: Insufficient documentation

## 2020-12-10 DIAGNOSIS — C4359 Malignant melanoma of other part of trunk: Secondary | ICD-10-CM | POA: Insufficient documentation

## 2020-12-10 DIAGNOSIS — K219 Gastro-esophageal reflux disease without esophagitis: Secondary | ICD-10-CM | POA: Insufficient documentation

## 2020-12-10 DIAGNOSIS — C801 Malignant (primary) neoplasm, unspecified: Secondary | ICD-10-CM | POA: Insufficient documentation

## 2020-12-10 DIAGNOSIS — I2510 Atherosclerotic heart disease of native coronary artery without angina pectoris: Secondary | ICD-10-CM | POA: Insufficient documentation

## 2020-12-10 DIAGNOSIS — C4491 Basal cell carcinoma of skin, unspecified: Secondary | ICD-10-CM | POA: Insufficient documentation

## 2020-12-10 DIAGNOSIS — F32A Depression, unspecified: Secondary | ICD-10-CM | POA: Insufficient documentation

## 2020-12-10 DIAGNOSIS — J383 Other diseases of vocal cords: Secondary | ICD-10-CM | POA: Insufficient documentation

## 2020-12-10 DIAGNOSIS — M1990 Unspecified osteoarthritis, unspecified site: Secondary | ICD-10-CM | POA: Insufficient documentation

## 2020-12-10 DIAGNOSIS — I10 Essential (primary) hypertension: Secondary | ICD-10-CM | POA: Insufficient documentation

## 2020-12-10 DIAGNOSIS — Z973 Presence of spectacles and contact lenses: Secondary | ICD-10-CM | POA: Insufficient documentation

## 2020-12-10 DIAGNOSIS — I110 Hypertensive heart disease with heart failure: Secondary | ICD-10-CM | POA: Insufficient documentation

## 2020-12-13 ENCOUNTER — Other Ambulatory Visit: Payer: Self-pay | Admitting: Family Medicine

## 2020-12-13 DIAGNOSIS — M1711 Unilateral primary osteoarthritis, right knee: Secondary | ICD-10-CM | POA: Diagnosis not present

## 2020-12-13 DIAGNOSIS — M19079 Primary osteoarthritis, unspecified ankle and foot: Secondary | ICD-10-CM | POA: Diagnosis not present

## 2020-12-14 ENCOUNTER — Encounter: Payer: Self-pay | Admitting: Cardiology

## 2020-12-15 ENCOUNTER — Ambulatory Visit: Payer: Medicare Other | Admitting: Cardiology

## 2020-12-15 ENCOUNTER — Encounter: Payer: Self-pay | Admitting: Cardiology

## 2020-12-15 ENCOUNTER — Other Ambulatory Visit: Payer: Self-pay

## 2020-12-15 VITALS — BP 126/70 | HR 77 | Ht 70.0 in | Wt 196.2 lb

## 2020-12-15 DIAGNOSIS — I10 Essential (primary) hypertension: Secondary | ICD-10-CM

## 2020-12-15 DIAGNOSIS — I25119 Atherosclerotic heart disease of native coronary artery with unspecified angina pectoris: Secondary | ICD-10-CM

## 2020-12-15 DIAGNOSIS — E119 Type 2 diabetes mellitus without complications: Secondary | ICD-10-CM | POA: Diagnosis not present

## 2020-12-15 DIAGNOSIS — E782 Mixed hyperlipidemia: Secondary | ICD-10-CM | POA: Diagnosis not present

## 2020-12-15 NOTE — Patient Instructions (Signed)
Medication Instructions:  No medication changes. *If you need a refill on your cardiac medications before your next appointment, please call your pharmacy*   Lab Work: Your physician recommends that you return for lab work in: next few days. You need to have labs done when you are fasting.  You can come Monday through Friday 8:30 am to 12:00 pm and 1:15 to 4:30. You do not need to make an appointment as the order has already been placed. The labs you are going to have done are BMET, CBC, TSH, hgb A1C, LFT and Lipids.  If you have labs (blood work) drawn today and your tests are completely normal, you will receive your results only by: Marland Kitchen MyChart Message (if you have MyChart) OR . A paper copy in the mail If you have any lab test that is abnormal or we need to change your treatment, we will call you to review the results.   Testing/Procedures: Your physician has requested that you have an echocardiogram. Echocardiography is a painless test that uses sound waves to create images of your heart. It provides your doctor with information about the size and shape of your heart and how well your heart's chambers and valves are working. This procedure takes approximately one hour. There are no restrictions for this procedure.     Follow-Up: At [facility], you and your health needs are our priority.  As part of our continuing mission to provide you with exceptional heart care, we have created designated Provider Care Teams.  These Care Teams include your primary Cardiologist (physician) and Advanced Practice Providers (APPs -  Physician Assistants and Nurse Practitioners) who all work together to provide you with the care you need, when you need it.  We recommend signing up for the patient portal called "MyChart".  Sign up information is provided on this After Visit Summary.  MyChart is used to connect with patients for Virtual Visits (Telemedicine).  Patients are able to view lab/test results,  encounter notes, upcoming appointments, etc.  Non-urgent messages can be sent to your provider as well.   To learn more about what you can do with MyChart, go to [email or web address].    Your next appointment:   6 month(s)  The format for your next appointment:   In Person  Provider:   Jyl Heinz, MD   Other Instructions  Echocardiogram An echocardiogram is a test that uses sound waves (ultrasound) to produce images of the heart. Images from an echocardiogram can provide important information about:  Heart size and shape.  The size and thickness and movement of your heart's walls.  Heart muscle function and strength.  Heart valve function or if you have stenosis. Stenosis is when the heart valves are too narrow.  If blood is flowing backward through the heart valves (regurgitation).  A tumor or infectious growth around the heart valves.  Areas of heart muscle that are not working well because of poor blood flow or injury from a heart attack.  Aneurysm detection. An aneurysm is a weak or damaged part of an artery wall. The wall bulges out from the normal force of blood pumping through the body. Tell a health care provider about:  Any allergies you have.  All medicines you are taking, including vitamins, herbs, eye drops, creams, and over-the-counter medicines.  Any blood disorders you have.  Any surgeries you have had.  Any medical conditions you have.  Whether you are pregnant or may be pregnant. What are the risks? Generally, this  is a safe test. However, problems may occur, including an allergic reaction to dye (contrast) that may be used during the test. What happens before the test? No specific preparation is needed. You may eat and drink normally. What happens during the test?  You will take off your clothes from the waist up and put on a hospital gown.  Electrodes or electrocardiogram (ECG)patches may be placed on your chest. The electrodes or  patches are then connected to a device that monitors your heart rate and rhythm.  You will lie down on a table for an ultrasound exam. A gel will be applied to your chest to help sound waves pass through your skin.  A handheld device, called a transducer, will be pressed against your chest and moved over your heart. The transducer produces sound waves that travel to your heart and bounce back (or "echo" back) to the transducer. These sound waves will be captured in real-time and changed into images of your heart that can be viewed on a video monitor. The images will be recorded on a computer and reviewed by your health care provider.  You may be asked to change positions or hold your breath for a short time. This makes it easier to get different views or better views of your heart.  In some cases, you may receive contrast through an IV in one of your veins. This can improve the quality of the pictures from your heart. The procedure may vary among health care providers and hospitals.   What can I expect after the test? You may return to your normal, everyday life, including diet, activities, and medicines, unless your health care provider tells you not to do that. Follow these instructions at home:  It is up to you to get the results of your test. Ask your health care provider, or the department that is doing the test, when your results will be ready.  Keep all follow-up visits. This is important. Summary  An echocardiogram is a test that uses sound waves (ultrasound) to produce images of the heart.  Images from an echocardiogram can provide important information about the size and shape of your heart, heart muscle function, heart valve function, and other possible heart problems.  You do not need to do anything to prepare before this test. You may eat and drink normally.  After the echocardiogram is completed, you may return to your normal, everyday life, unless your health care provider  tells you not to do that. This information is not intended to replace advice given to you by your health care provider. Make sure you discuss any questions you have with your health care provider. Document Revised: 06/22/2020 Document Reviewed: 06/22/2020 Elsevier Patient Education  2021 [employer].

## 2020-12-15 NOTE — Progress Notes (Signed)
Cardiology Office Note:    Date:  12/15/2020   ID:  Don [name], DOB 1951/04/20, MRN [id number]  PCP:  [name], MD  Cardiologist:  Jenean Lindau, MD   Referring MD: Jerelyn Charles., *    ASSESSMENT:    1. Coronary artery disease involving native coronary artery of native heart with angina pectoris ([place])   2. Essential hypertension   3. Mixed hyperlipidemia    PLAN:    In order of problems listed above:  1. Coronary artery disease: Secondary prevention stressed with patient.  Importance of compliance with diet medication stressed and he vocalized understanding. 2. Essential hypertension: Blood pressure stable and diet was emphasized. 3. Mixed dyslipidemia: Diet emphasized.  Will be back in the next few days for blood work including fasting lipids.  He is on statin therapy. 4. Diabetes mellitus: Managed by primary care physician. 5. Cigarette smoker: I spent 5 minutes with the patient discussing solely about smoking. Smoking cessation was counseled. I suggested to the patient also different medications and pharmacological interventions. Patient is keen to try stopping on its own at this time. He will get back to me if he needs any further assistance in this matter. 6. Patient will be seen in follow-up appointment in 6 months or earlier if the patient has any concerns    Medication Adjustments/Labs and Tests Ordered: Current medicines are reviewed at length with the patient today.  Concerns regarding medicines are outlined above.  No orders of the defined types were placed in this encounter.  No orders of the defined types were placed in this encounter.    History of Present Illness:    Don [name] is a 70 y.o. male who is being seen today for the evaluation of coronary artery disease, essential hypertension dyslipidemia and diabetes mellitus.  He has undergone coronary stenting in the past.  He is wheelchair-bound because of neurological issues  affecting his back.  He is very much supported by his wife was brought in here for the appointment.  He denies any chest pain orthopnea or PND.  At the time of my evaluation, the patient is alert awake oriented and in no distress.  Past Medical History:  Diagnosis Date  . Arthritis   . Asymptomatic LV dysfunction 10/18/2017  . Basal cell carcinoma   . Benign prostatic hyperplasia with incomplete bladder emptying 01/14/2019  . Bone pain 03/08/2020  . Cancer ([place])    throat - 1997, throat - 2018  . Cardiomyopathy, secondary ([place]) 12/01/2016   Overview:  EF 47% 12/26/16  . Chest pain syndrome 11/02/2017  . Chronic coronary artery disease 10/18/2017  . CKD (chronic kidney disease) stage 3, GFR 30-59 ml/min (HCC) 07/25/2019  . COPD (chronic obstructive pulmonary disease) ([place])   . Coronary artery disease   . Coronary artery disease involving native coronary artery of native heart with angina pectoris ([place]) 12/01/2016   Overview:  He has hx of IWMI in remote past, last cath in 2012 at [facility] showed chronic total occlusion of previously stented RCA, with good collaterals, a 40% LAD stenosis, inferior hypokinesis and EF 45%.    He's been lost to Cardiology f/u since 2015, but has not had any recurrent events  . Depression   . Diabetes mellitus ([place]) 03/08/2020  . Diverticulitis 05/02/2020  . Dyslipidemia 12/01/2016  . Emphysema lung ([place])   . Erectile dysfunction due to diseases classified elsewhere 06/12/2019  . Essential hypertension 10/18/2017  . GERD (gastroesophageal reflux disease)   .  Hoarseness 11/15/2016  . Hyperlipidemia   . Hypertension   . Laceration of thumb, left 12/17/2015  . Lesion of vocal cord   . Leukoplakia of larynx 11/15/2016  . Long-term use of aspirin therapy 10/02/2018  . Malfunction of penile prosthesis ([place]) 01/14/2019  . Melanoma of back ([place])    melanoma on back  . Memory loss 03/08/2020  . Myalgia 03/08/2020  . Myocardial infarction ([place]) 2000   2 stents  . PAD (peripheral artery  disease) ([place]) 10/18/2017  . Peripheral vascular disease (HCC)    iliac artery clot  . Peyronie's disease 06/12/2019  . Pharyngoesophageal dysphagia 08/12/2018  . Pneumonia 02/2015  . Pneumothorax 06/07/2020  . Preoperative clearance 04/03/2018  . Squamous cell carcinoma of larynx ([place]) 12/13/2016  . Testicular pain, left 01/14/2019  . Throat cancer ([place])   . Tobacco use disorder 06/12/2019  . Wears glasses     Past Surgical History:  Procedure Laterality Date  . ABDOMINAL ANGIOGRAM N/A 01/13/2015   Procedure: ABDOMINAL ANGIOGRAM;  Surgeon: Rosetta Posner, MD;  Location: [facility] CATH LAB;  Service: Cardiovascular;  Laterality: N/A;  . APPENDECTOMY    . BACK SURGERY    . CARDIAC CATHETERIZATION  2000/2012   with stents in 2000  . CHOLECYSTECTOMY    . COLONOSCOPY    . ELBOW SURGERY     bilateral  . ESOPHAGOGASTRODUODENOSCOPY    . KNEE SURGERY Left   . MICROLARYNGOSCOPY WITH CO2 LASER AND EXCISION OF VOCAL CORD LESION N/A 11/23/2016   Procedure: MICROLARYNGOSCOPY  AND EXCISION OF VOCAL CORD LESION;  Surgeon: Melissa Montane, MD;  Location: [place];  Service: ENT;  Laterality: N/A;  . MICROLARYNGOSCOPY WITH CO2 LASER AND EXCISION OF VOCAL CORD LESION N/A 01/11/2017   Procedure: MICROLARYNGOSCOPY WITH CO2 LASER AND EXCISION OF VOCAL CORD LESION;  Surgeon: Melissa Montane, MD;  Location: [place];  Service: ENT;  Laterality: N/A;  . MICROLARYNGOSCOPY WITH LASER N/A 03/16/2015   Procedure: MICROLARYNGOSCOPY ;  Surgeon: Melissa Montane, MD;  Location: [place];  Service: ENT;  Laterality: N/A;  . peyronie's surgery    . SHOULDER SURGERY Right    rotator cuff  . SPINE SURGERY  09/2020   cervical surgery. steel plate, bone spurs, allograft.  . THROAT SURGERY  1997   cancer removed    Current Medications: Current Meds  Medication Sig  . acetaminophen (TYLENOL) 325 MG tablet Take 650 mg by mouth every 4 (four) hours as needed for mild pain.  Marland Kitchen ALPRAZolam (XANAX) 0.25 MG tablet TAKE 1 TABLET BY MOUTH ONCE DAILY AS NEEDED FOR  ANXIETY  . amLODipine (NORVASC) 5 MG tablet TAKE 1 TABLET BY MOUTH ONCE DAILY . APPOINTMENT REQUIRED FOR FUTURE REFILLS  . aspirin 81 MG tablet Take 162 mg by mouth daily.   Marland Kitchen BREO ELLIPTA 100-25 MCG/INH AEPB INHALE 1 PUFF BY MOUTH ONCE DAILY  . clopidogrel (PLAVIX) 75 MG tablet Take 1 tablet by mouth once daily  . cyclobenzaprine (FLEXERIL) 10 MG tablet TAKE 1 TABLET BY MOUTH THREE TIMES DAILY AS NEEDED FOR  BACK  SPASMS  . DULoxetine (CYMBALTA) 60 MG capsule Take 1 capsule by mouth twice daily  . gabapentin (NEURONTIN) 400 MG capsule TAKE 1 CAPSULE BY MOUTH THREE TIMES DAILY  . HYDROcodone-acetaminophen (NORCO/VICODIN) 5-325 MG tablet Take 1 tablet by mouth every 6 (six) hours as needed for moderate pain.  Marland Kitchen ipratropium-albuterol (DUONEB) 0.5-2.5 (3) MG/3ML SOLN Take 3 mLs by nebulization every 6 (six) hours as needed (for shortness of breath/wheezing. (  with bronchitis)).  Marland Kitchen lisinopril-hydrochlorothiazide (ZESTORETIC) 10-12.5 MG tablet Take 1 tablet by mouth daily.  . metFORMIN (GLUCOPHAGE) 500 MG tablet Take 1 tablet by mouth twice daily  . metoprolol tartrate (LOPRESSOR) 50 MG tablet Take 1 tablet (50 mg total) by mouth 2 (two) times daily.  . nitroGLYCERIN (NITROSTAT) 0.4 MG SL tablet Place 0.4 mg under the tongue every 5 (five) minutes as needed for chest pain.  Marland Kitchen nystatin cream (MYCOSTATIN) Apply 1 application topically 2 (two) times daily as needed for dry skin.  Marland Kitchen omeprazole (PRILOSEC) 20 MG capsule Take 1 capsule (20 mg total) by mouth daily.  . rosuvastatin (CRESTOR) 40 MG tablet Take 1 tablet (40 mg total) by mouth daily.  Marland Kitchen SPIRIVA RESPIMAT 2.5 MCG/ACT AERS Inhale 2 puffs into the lungs daily.   . tamsulosin (FLOMAX) 0.4 MG CAPS capsule TAKE 1 CAPSULE BY MOUTH ONCE DAILY AT BEDTIME  . TOUJEO SOLOSTAR 300 UNIT/ML Solostar Pen INJECT 30 UNITS SUBCUTANEOUSLY ONCE DAILY  . traZODone (DESYREL) 150 MG tablet Take 1 tablet by mouth once daily  . VENTOLIN HFA 108 (90 Base) MCG/ACT inhaler  Inhale 2 puffs into the lungs as needed for shortness of breath or wheezing.     Allergies:   Penicillins, Prednisone, Doxycycline, and Iodinated diagnostic agents   Social History   Socioeconomic History  . Marital status: Married    Spouse name: Not on file  . Number of children: 4  . Years of education: Not on file  . Highest education level: GED or equivalent  Occupational History  . Occupation: retired  Tobacco Use  . Smoking status: Current Every Day Smoker    Packs/day: 1.00    Years: 50.00    Pack years: 50.00    Types: Cigarettes  . Smokeless tobacco: Never Used  . Tobacco comment: down to .5 ppd  Vaping Use  . Vaping Use: Former  Substance and Sexual Activity  . Alcohol use: No    Alcohol/week: 0.0 standard drinks  . Drug use: No  . Sexual activity: Not on file  Other Topics Concern  . Not on file  Social History Narrative   Lives with wife       One level      Right hand   Social Determinants of Health   Financial Resource Strain: Not on file  Food Insecurity: Not on file  Transportation Needs: Not on file  Physical Activity: Not on file  Stress: Not on file  Social Connections: Not on file     Family History: The patient's family history includes Cancer in his mother and another family member; Diabetes in an other family member; Healthy in his child; Hypertension in his father and mother; Stroke in his father.  ROS:   Please see the history of present illness.    All other systems reviewed and are negative.  EKGs/Labs/Other Studies Reviewed:    The following studies were reviewed today: Coronary Findings Diagnostic Dominance: Right Left Main: The vessel was visualized by angiography, is large and is angiographically normal.  Left Anterior Descending: Mid LAD lesion is 30% stenosed.  First Obtuse Marginal Branch: 1st Mrg lesion is 25% stenosed.  Right Coronary Artery: Dist RCA filled by collaterals from Dist Cx. Mid RCA filled by collaterals  from Prox RCA. Prox RCA lesion is 100% stenosed. The lesion is chronically occluded. The lesion was previously treated using a stent of unknown type. Previous treatment took place >2 years ago.   Intervention No interventions have been documented.  Left Ventricle The left ventricle is mildly dilated. There is moderate left ventricular systolic dysfunction. LV end diastolic pressure is normal.  Wall Motion All segments of the heart are hypokinetic.   Recent Labs: 03/18/2020: Magnesium 1.7; TSH 2.060  Recent Lipid Panel No results found for: CHOL, TRIG, HDL, CHOLHDL, VLDL, LDLCALC, LDLDIRECT  Physical Exam:    VS:  BP 126/70   Pulse 77   Ht 5\' 10"  (1.778 m)   Wt 196 lb 3.2 oz (89 kg)   SpO2 95%   BMI 28.15 kg/m     Wt Readings from Last 3 Encounters:  12/15/20 196 lb 3.2 oz (89 kg)  12/01/20 177 lb (80.3 kg)  08/23/20 195 lb 9.6 oz (88.7 kg)     GEN: Patient is in no acute distress HEENT: Normal NECK: No JVD; No carotid bruits LYMPHATICS: No lymphadenopathy CARDIAC: S1 S2 regular, 2/6 systolic murmur at the apex. RESPIRATORY:  Clear to auscultation without rales, wheezing or rhonchi  ABDOMEN: Soft, non-tender, non-distended MUSCULOSKELETAL:  No edema; No deformity  SKIN: Warm and dry NEUROLOGIC:  Alert and oriented x 3 PSYCHIATRIC:  Normal affect    Signed, Jenean Lindau, MD  12/15/2020 3:36 PM    [facility]

## 2020-12-16 DIAGNOSIS — I25119 Atherosclerotic heart disease of native coronary artery with unspecified angina pectoris: Secondary | ICD-10-CM | POA: Diagnosis not present

## 2020-12-16 DIAGNOSIS — M4802 Spinal stenosis, cervical region: Secondary | ICD-10-CM | POA: Diagnosis not present

## 2020-12-16 DIAGNOSIS — E119 Type 2 diabetes mellitus without complications: Secondary | ICD-10-CM | POA: Diagnosis not present

## 2020-12-16 DIAGNOSIS — Z1211 Encounter for screening for malignant neoplasm of colon: Secondary | ICD-10-CM | POA: Diagnosis not present

## 2020-12-16 DIAGNOSIS — I10 Essential (primary) hypertension: Secondary | ICD-10-CM | POA: Diagnosis not present

## 2020-12-16 DIAGNOSIS — E782 Mixed hyperlipidemia: Secondary | ICD-10-CM | POA: Diagnosis not present

## 2020-12-16 DIAGNOSIS — R1310 Dysphagia, unspecified: Secondary | ICD-10-CM | POA: Diagnosis not present

## 2020-12-17 LAB — LIPID PANEL
Chol/HDL Ratio: 5 ratio (ref 0.0–5.0)
Cholesterol, Total: 129 mg/dL (ref 100–199)
HDL: 26 mg/dL — ABNORMAL LOW (ref >39)
LDL Chol Calc (NIH): 55 mg/dL (ref 0–99)
Triglycerides: 311 mg/dL — ABNORMAL HIGH (ref 0–149)
VLDL Cholesterol Cal: 48 mg/dL — ABNORMAL HIGH (ref 5–40)

## 2020-12-17 LAB — CBC WITH DIFFERENTIAL/PLATELET
Basophils Absolute: 0.1 10*3/uL (ref 0.0–0.2)
Basos: 1 %
EOS (ABSOLUTE): 0.3 10*3/uL (ref 0.0–0.4)
Eos: 3 %
Hematocrit: 41.4 % (ref 37.5–51.0)
Hemoglobin: 13.8 g/dL (ref 13.0–17.7)
Immature Grans (Abs): 0.1 10*3/uL (ref 0.0–0.1)
Immature Granulocytes: 1 %
Lymphocytes Absolute: 2.7 10*3/uL (ref 0.7–3.1)
Lymphs: 28 %
MCH: 29.6 pg (ref 26.6–33.0)
MCHC: 33.3 g/dL (ref 31.5–35.7)
MCV: 89 fL (ref 79–97)
Monocytes Absolute: 0.7 10*3/uL (ref 0.1–0.9)
Monocytes: 7 %
Neutrophils Absolute: 5.8 10*3/uL (ref 1.4–7.0)
Neutrophils: 60 %
Platelets: 230 10*3/uL (ref 150–450)
RBC: 4.66 x10E6/uL (ref 4.14–5.80)
RDW: 13.6 % (ref 11.6–15.4)
WBC: 9.7 10*3/uL (ref 3.4–10.8)

## 2020-12-17 LAB — BASIC METABOLIC PANEL
BUN/Creatinine Ratio: 11 (ref 10–24)
BUN: 10 mg/dL (ref 8–27)
CO2: 30 mmol/L — ABNORMAL HIGH (ref 20–29)
Calcium: 9.1 mg/dL (ref 8.6–10.2)
Chloride: 100 mmol/L (ref 96–106)
Creatinine, Ser: 0.87 mg/dL (ref 0.76–1.27)
GFR calc Af Amer: 100 mL/min/{1.73_m2} (ref >59)
GFR calc non Af Amer: 87 mL/min/{1.73_m2} (ref >59)
Glucose: 143 mg/dL — ABNORMAL HIGH (ref 65–99)
Potassium: 4.5 mmol/L (ref 3.5–5.2)
Sodium: 141 mmol/L (ref 134–144)

## 2020-12-17 LAB — HEMOGLOBIN A1C
Est. average glucose Bld gHb Est-mCnc: 148 mg/dL
Hgb A1c MFr Bld: 6.8 % — ABNORMAL HIGH (ref 4.8–5.6)

## 2020-12-17 LAB — HEPATIC FUNCTION PANEL
ALT: 17 IU/L (ref 0–44)
AST: 19 IU/L (ref 0–40)
Albumin: 4 g/dL (ref 3.7–4.7)
Alkaline Phosphatase: 125 IU/L — ABNORMAL HIGH (ref 44–121)
Bilirubin Total: 0.3 mg/dL (ref 0.0–1.2)
Bilirubin, Direct: 0.11 mg/dL (ref 0.00–0.40)
Total Protein: 6.8 g/dL (ref 6.0–8.5)

## 2020-12-17 LAB — TSH: TSH: 2.63 u[IU]/mL (ref 0.450–4.500)

## 2020-12-20 ENCOUNTER — Encounter: Payer: Self-pay | Admitting: Family Medicine

## 2020-12-20 ENCOUNTER — Other Ambulatory Visit: Payer: Self-pay | Admitting: Family Medicine

## 2020-12-20 MED ORDER — FISH OIL 1000 MG PO CAPS: 2.0000 | ORAL_CAPSULE | Freq: Two times a day (BID) | ORAL | 12 refills | Status: DC

## 2020-12-20 NOTE — Addendum Note (Signed)
Addended by: Truddie Hidden on: 12/20/2020 02:20 PM   Modules accepted: Orders

## 2020-12-21 ENCOUNTER — Telehealth: Payer: Self-pay

## 2020-12-21 NOTE — Telephone Encounter (Signed)
Tavia with [facility] called stating that she has not been able to contact patient since 12/14/2020. At that time patient had stated they had been exposed to Covid, on 12/09/2020 they asked that she wait on coming to evaluate because he had a doctors appt. Since they have not been able to reach patient they stated he will be a "non-admit". Should he desire to have their services we can submit another referral to their office.  Attempted to contact patient, no answer.

## 2020-12-21 NOTE — Telephone Encounter (Signed)
Please call his wife and give her [facility] phone number.  kc

## 2021-01-03 DIAGNOSIS — R1310 Dysphagia, unspecified: Secondary | ICD-10-CM | POA: Diagnosis not present

## 2021-01-04 ENCOUNTER — Other Ambulatory Visit: Payer: Self-pay | Admitting: Family Medicine

## 2021-01-05 ENCOUNTER — Ambulatory Visit: Payer: Medicare Other | Admitting: Cardiology

## 2021-01-11 ENCOUNTER — Other Ambulatory Visit: Payer: Self-pay | Admitting: Family Medicine

## 2021-01-11 DIAGNOSIS — I10 Essential (primary) hypertension: Secondary | ICD-10-CM

## 2021-01-11 DIAGNOSIS — E1142 Type 2 diabetes mellitus with diabetic polyneuropathy: Secondary | ICD-10-CM

## 2021-01-11 MED ORDER — HYDROCODONE-ACETAMINOPHEN 5-325 MG PO TABS: 1.0000 | ORAL_TABLET | Freq: Three times a day (TID) | ORAL | 0 refills | Status: DC | PRN

## 2021-01-11 MED ORDER — ONDANSETRON 8 MG PO TBDP: ORAL_TABLET | ORAL | 0 refills | Status: DC

## 2021-01-14 ENCOUNTER — Other Ambulatory Visit: Payer: Medicare Other

## 2021-01-17 ENCOUNTER — Other Ambulatory Visit: Payer: Self-pay | Admitting: Family Medicine

## 2021-01-24 ENCOUNTER — Other Ambulatory Visit: Payer: Self-pay | Admitting: Family Medicine

## 2021-01-28 DIAGNOSIS — W19XXXA Unspecified fall, initial encounter: Secondary | ICD-10-CM | POA: Diagnosis not present

## 2021-01-28 DIAGNOSIS — R413 Other amnesia: Secondary | ICD-10-CM | POA: Diagnosis not present

## 2021-01-28 DIAGNOSIS — R2689 Other abnormalities of gait and mobility: Secondary | ICD-10-CM | POA: Diagnosis not present

## 2021-01-28 DIAGNOSIS — G4700 Insomnia, unspecified: Secondary | ICD-10-CM | POA: Insufficient documentation

## 2021-01-28 DIAGNOSIS — R29898 Other symptoms and signs involving the musculoskeletal system: Secondary | ICD-10-CM | POA: Diagnosis not present

## 2021-01-28 DIAGNOSIS — R443 Hallucinations, unspecified: Secondary | ICD-10-CM | POA: Diagnosis not present

## 2021-01-28 DIAGNOSIS — C4490 Unspecified malignant neoplasm of skin, unspecified: Secondary | ICD-10-CM | POA: Insufficient documentation

## 2021-01-28 DIAGNOSIS — R1310 Dysphagia, unspecified: Secondary | ICD-10-CM | POA: Diagnosis not present

## 2021-01-28 HISTORY — DX: Insomnia, unspecified: G47.00

## 2021-01-28 HISTORY — DX: Unspecified malignant neoplasm of skin, unspecified: C44.90

## 2021-01-31 ENCOUNTER — Other Ambulatory Visit: Payer: Self-pay | Admitting: Family Medicine

## 2021-02-08 ENCOUNTER — Other Ambulatory Visit: Payer: Self-pay | Admitting: Family Medicine

## 2021-02-12 DIAGNOSIS — R531 Weakness: Secondary | ICD-10-CM | POA: Diagnosis not present

## 2021-02-12 DIAGNOSIS — R1310 Dysphagia, unspecified: Secondary | ICD-10-CM | POA: Diagnosis not present

## 2021-02-12 DIAGNOSIS — R29898 Other symptoms and signs involving the musculoskeletal system: Secondary | ICD-10-CM | POA: Diagnosis not present

## 2021-02-14 ENCOUNTER — Encounter: Payer: Self-pay | Admitting: Family Medicine

## 2021-02-14 ENCOUNTER — Telehealth (INDEPENDENT_AMBULATORY_CARE_PROVIDER_SITE_OTHER): Payer: Medicare Other | Admitting: Family Medicine

## 2021-02-14 VITALS — BP 148/83 | HR 69 | Temp 98.7°F

## 2021-02-14 DIAGNOSIS — N39498 Other specified urinary incontinence: Secondary | ICD-10-CM | POA: Diagnosis not present

## 2021-02-14 DIAGNOSIS — R1319 Other dysphagia: Secondary | ICD-10-CM | POA: Diagnosis not present

## 2021-02-14 DIAGNOSIS — J441 Chronic obstructive pulmonary disease with (acute) exacerbation: Secondary | ICD-10-CM | POA: Diagnosis not present

## 2021-02-14 DIAGNOSIS — R296 Repeated falls: Secondary | ICD-10-CM

## 2021-02-14 MED ORDER — PREDNISONE 20 MG PO TABS: ORAL_TABLET | ORAL | 0 refills | Status: AC

## 2021-02-14 MED ORDER — LEVOFLOXACIN 750 MG PO TABS: 750.0000 mg | ORAL_TABLET | Freq: Every day | ORAL | 0 refills | Status: DC

## 2021-02-14 NOTE — Progress Notes (Signed)
Virtual Visit via Telephone Note   This visit type was conducted due to national recommendations for restrictions regarding the COVID-19 Pandemic (e.g. social distancing) in an effort to limit this patient's exposure and mitigate transmission in our community.  Due to his co-morbid illnesses, this patient is at least at moderate risk for complications without adequate follow up.  This format is felt to be most appropriate for this patient at this time.  The patient did not have access to video technology/had technical difficulties with video requiring transitioning to audio format only (telephone).  All issues noted in this document were discussed and addressed.  No physical exam could be performed with this format.  Patient verbally consented to a telehealth visit.   Patient Location:home Provider Location:office  PCP:  [name], MD   Evaluation Performed: acute visit Time:  Today, I have spent 12 minutes with the patient with telehealth technology discussing the above problems.    t  Subjective:    Patient ID: Don [name], male    DOB: 07-26-1951, 70 y.o.   MRN: [id number]  Chief Complaint  Patient presents with  . Cough    HPI Patient is in today for phone visit for acute bronchitis on COPD.  Patient has had increasing cough and shortness of breath over the last week.  He has had a productive green-yellow sputum.  Complaining of fatigue.  Shortness of breath is slightly worse.  Denies GI symptoms.  Appetite good.  Taking Mucinex which has not helped.  Patient is currently taking Breo and Spiriva.  Has not increased his duo nebs.  Usually takes an antibiotic and a steroid to resolve his symptoms.  Patient did see a new neurologist.  Patient has fallen twice in the last week with no significant injuries.  The patient's balance is very poor.  Neurology has ordered a thoracic MRI and has started physical therapy.  Her patient's wife is has decreased to no sensation in his  right foot.  Over the last month he has had increasing bladder incontinence without any urge to go.  This is intermittent. Past Medical History:  Diagnosis Date  . Arthritis   . Asymptomatic LV dysfunction 10/18/2017  . Basal cell carcinoma   . Benign prostatic hyperplasia with incomplete bladder emptying 01/14/2019  . Bone pain 03/08/2020  . Cancer ([place])    throat - 1997, throat - 2018  . Cardiomyopathy, secondary ([place]) 12/01/2016   Overview:  EF 47% 12/26/16  . Chest pain syndrome 11/02/2017  . Chronic coronary artery disease 10/18/2017  . CKD (chronic kidney disease) stage 3, GFR 30-59 ml/min (HCC) 07/25/2019  . COPD (chronic obstructive pulmonary disease) ([place])   . Coronary artery disease   . Coronary artery disease involving native coronary artery of native heart with angina pectoris ([place]) 12/01/2016   Overview:  He has hx of IWMI in remote past, last cath in 2012 at [facility] showed chronic total occlusion of previously stented RCA, with good collaterals, a 40% LAD stenosis, inferior hypokinesis and EF 45%.    He's been lost to Cardiology f/u since 2015, but has not had any recurrent events  . Depression   . Diabetes mellitus ([place]) 03/08/2020  . Diverticulitis 05/02/2020  . Dyslipidemia 12/01/2016  . Emphysema lung ([place])   . Erectile dysfunction due to diseases classified elsewhere 06/12/2019  . Essential hypertension 10/18/2017  . GERD (gastroesophageal reflux disease)   . Hoarseness 11/15/2016  . Hyperlipidemia   . Hypertension   .  Laceration of thumb, left 12/17/2015  . Lesion of vocal cord   . Leukoplakia of larynx 11/15/2016  . Long-term use of aspirin therapy 10/02/2018  . Malfunction of penile prosthesis ([place]) 01/14/2019  . Melanoma of back ([place])    melanoma on back  . Memory loss 03/08/2020  . Myalgia 03/08/2020  . Myocardial infarction ([place]) 2000   2 stents  . PAD (peripheral artery disease) ([place]) 10/18/2017  . Peripheral vascular disease (HCC)    iliac artery clot  . Peyronie's disease  06/12/2019  . Pharyngoesophageal dysphagia 08/12/2018  . Pneumonia 02/2015  . Pneumothorax 06/07/2020  . Preoperative clearance 04/03/2018  . Squamous cell carcinoma of larynx ([place]) 12/13/2016  . Testicular pain, left 01/14/2019  . Throat cancer ([place])   . Tobacco use disorder 06/12/2019  . Wears glasses     Past Surgical History:  Procedure Laterality Date  . ABDOMINAL ANGIOGRAM N/A 01/13/2015   Procedure: ABDOMINAL ANGIOGRAM;  Surgeon: Rosetta Posner, MD;  Location: [facility] CATH LAB;  Service: Cardiovascular;  Laterality: N/A;  . APPENDECTOMY    . BACK SURGERY    . CARDIAC CATHETERIZATION  2000/2012   with stents in 2000  . CHOLECYSTECTOMY    . COLONOSCOPY    . ELBOW SURGERY     bilateral  . ESOPHAGOGASTRODUODENOSCOPY    . KNEE SURGERY Left   . MICROLARYNGOSCOPY WITH CO2 LASER AND EXCISION OF VOCAL CORD LESION N/A 11/23/2016   Procedure: MICROLARYNGOSCOPY  AND EXCISION OF VOCAL CORD LESION;  Surgeon: Melissa Montane, MD;  Location: [place];  Service: ENT;  Laterality: N/A;  . MICROLARYNGOSCOPY WITH CO2 LASER AND EXCISION OF VOCAL CORD LESION N/A 01/11/2017   Procedure: MICROLARYNGOSCOPY WITH CO2 LASER AND EXCISION OF VOCAL CORD LESION;  Surgeon: Melissa Montane, MD;  Location: [place];  Service: ENT;  Laterality: N/A;  . MICROLARYNGOSCOPY WITH LASER N/A 03/16/2015   Procedure: MICROLARYNGOSCOPY ;  Surgeon: Melissa Montane, MD;  Location: [facility];  Service: ENT;  Laterality: N/A;  . peyronie's surgery    . SHOULDER SURGERY Right    rotator cuff  . SPINE SURGERY  09/2020   cervical surgery. steel plate, bone spurs, allograft.  . THROAT SURGERY  1997   cancer removed    Family History  Problem Relation Age of Onset  . Cancer Mother        bone  . Hypertension Mother   . Stroke Father   . Hypertension Father   . Healthy Child   . Cancer Other        brain  . Diabetes Other     Social History   Socioeconomic History  . Marital status: Married    Spouse name: Not on file  . Number of children: 4  . Years  of education: Not on file  . Highest education level: GED or equivalent  Occupational History  . Occupation: retired  Tobacco Use  . Smoking status: Current Every Day Smoker    Packs/day: 1.00    Years: 50.00    Pack years: 50.00    Types: Cigarettes  . Smokeless tobacco: Never Used  . Tobacco comment: down to .5 ppd  Vaping Use  . Vaping Use: Former  Substance and Sexual Activity  . Alcohol use: No    Alcohol/week: 0.0 standard drinks  . Drug use: No  . Sexual activity: Not on file  Other Topics Concern  . Not on file  Social History Narrative   Lives with wife  One level      Right hand   Social Determinants of Health   Financial Resource Strain: Not on file  Food Insecurity: Not on file  Transportation Needs: Not on file  Physical Activity: Not on file  Stress: Not on file  Social Connections: Not on file  Intimate Partner Violence: Not on file    Outpatient Medications Prior to Visit  Medication Sig Dispense Refill  . acetaminophen (TYLENOL) 325 MG tablet Take 650 mg by mouth every 4 (four) hours as needed for mild pain.    Marland Kitchen ALPRAZolam (XANAX) 0.25 MG tablet TAKE 1 TABLET BY MOUTH ONCE DAILY AS NEEDED FOR ANXIETY 30 tablet 2  . amLODipine (NORVASC) 5 MG tablet TAKE 1 TABLET BY MOUTH ONCE DAILY . APPOINTMENT REQUIRED FOR FUTURE REFILLS 30 tablet 0  . aspirin 81 MG tablet Take 162 mg by mouth daily.     Marland Kitchen BREO ELLIPTA 100-25 MCG/INH AEPB INHALE 1 PUFF BY MOUTH ONCE DAILY    . clopidogrel (PLAVIX) 75 MG tablet Take 1 tablet by mouth once daily 90 tablet 2  . cyclobenzaprine (FLEXERIL) 10 MG tablet TAKE 1 TABLET BY MOUTH THREE TIMES DAILY AS NEEDED FOR BACK SPASMS 90 tablet 1  . DULoxetine (CYMBALTA) 60 MG capsule Take 1 capsule by mouth twice daily 180 capsule 0  . gabapentin (NEURONTIN) 400 MG capsule TAKE 1 CAPSULE BY MOUTH THREE TIMES DAILY 90 capsule 0  . HYDROcodone-acetaminophen (NORCO/VICODIN) 5-325 MG tablet Take 1 tablet by mouth every 8 (eight) hours  as needed for moderate pain. 90 tablet 0  . ipratropium-albuterol (DUONEB) 0.5-2.5 (3) MG/3ML SOLN Take 3 mLs by nebulization every 6 (six) hours as needed (for shortness of breath/wheezing. (with bronchitis)).    Marland Kitchen lisinopril-hydrochlorothiazide (ZESTORETIC) 10-12.5 MG tablet Take 1 tablet by mouth daily. 90 tablet 0  . metFORMIN (GLUCOPHAGE) 500 MG tablet Take 1 tablet by mouth twice daily 180 tablet 0  . metoprolol tartrate (LOPRESSOR) 50 MG tablet Take 1 tablet (50 mg total) by mouth 2 (two) times daily. 180 tablet 2  . nitroGLYCERIN (NITROSTAT) 0.4 MG SL tablet Place 0.4 mg under the tongue every 5 (five) minutes as needed for chest pain.    Marland Kitchen nystatin cream (MYCOSTATIN) Apply 1 application topically 2 (two) times daily as needed for dry skin.    . Omega-3 Fatty Acids (FISH OIL) 1000 MG CAPS Take 2 capsules (2,000 mg total) by mouth in the morning and at bedtime. 180 capsule 12  . omeprazole (PRILOSEC) 20 MG capsule Take 1 capsule (20 mg total) by mouth daily. 90 capsule 3  . ondansetron (ZOFRAN-ODT) 8 MG disintegrating tablet PLACE 1 TABLET ON TOP OF THE TONGUE WHERE IT WILL DISSOLVE, THEN SWALLOW BY TRANSLINGUAL ROUTE 1 HOUR BEFORE INDUCTION OF ANESTHESIA 26 tablet 0  . rosuvastatin (CRESTOR) 40 MG tablet Take 1 tablet by mouth once daily 90 tablet 0  . SPIRIVA RESPIMAT 2.5 MCG/ACT AERS Inhale 2 puffs into the lungs daily.     . tamsulosin (FLOMAX) 0.4 MG CAPS capsule TAKE 1 CAPSULE BY MOUTH ONCE DAILY AT BEDTIME    . TOUJEO SOLOSTAR 300 UNIT/ML Solostar Pen INJECT 30 UNITS SUBCUTANEOUSLY ONCE DAILY 6 mL 0  . traZODone (DESYREL) 150 MG tablet Take 1 tablet by mouth once daily 90 tablet 2  . VENTOLIN HFA 108 (90 Base) MCG/ACT inhaler Inhale 2 puffs into the lungs as needed for shortness of breath or wheezing.  0   No facility-administered medications prior to visit.  Allergies  Allergen Reactions  . Penicillins Rash and Hives    Has patient had a PCN reaction causing immediate rash,  facial/tongue/throat swelling, SOB or lightheadedness with hypotension:unsure Has patient had a PCN reaction causing severe rash involving mucus membranes or skin necrosis:unsure Has patient had a PCN reaction that required hospitalization:No Has patient had a PCN reaction occurring within the last 10 years:No If all of the above answers are "NO", then may proceed with Cephalosporin use.  rash  . Prednisone     Depressed mood - can tolerate it, but it makes him very irritable  . Doxycycline Rash  . Iodinated Diagnostic Agents Rash    Also developed blisters Also developed blisters    Review of Systems  Constitutional: Positive for fatigue.  HENT: Negative for ear pain and rhinorrhea.   Respiratory: Positive for cough and shortness of breath.   Gastrointestinal: Negative for abdominal pain, constipation, diarrhea, nausea and vomiting.       Dysphagia.  Patient had a swallowing study and is scheduled to follow-up with Dr. Melina Copa in 1 week.  The results of this swallowing study are not back.  Neurological: Positive for headaches.       Objective:    Physical Exam No exam done. BP (!) 148/83   Pulse 69   Temp 98.7 F (37.1 C)   SpO2 93%  Wt Readings from Last 3 Encounters:  12/15/20 196 lb 3.2 oz (89 kg)  12/01/20 177 lb (80.3 kg)  08/23/20 195 lb 9.6 oz (88.7 kg)    Health Maintenance Due  Topic Date Due  . Hepatitis C Screening  Never done  . FOOT EXAM  Never done  . OPHTHALMOLOGY EXAM  Never done  . TETANUS/TDAP  11/13/2006  . COLONOSCOPY (Pts 45-25yrs Insurance coverage will need to be confirmed)  10/27/2007  . PNA vac Low Risk Adult (2 of 2 - PPSV23) 09/30/2016  . COVID-19 Vaccine (2 - Moderna risk 4-dose series) 11/09/2020    There are no preventive care reminders to display for this patient.   Lab Results  Component Value Date   TSH 2.630 12/16/2020   Lab Results  Component Value Date   WBC 9.7 12/16/2020   HGB 13.8 12/16/2020   HCT 41.4 12/16/2020    MCV 89 12/16/2020   PLT 230 12/16/2020   Lab Results  Component Value Date   NA 141 12/16/2020   K 4.5 12/16/2020   CO2 30 (H) 12/16/2020   GLUCOSE 143 (H) 12/16/2020   BUN 10 12/16/2020   CREATININE 0.87 12/16/2020   BILITOT 0.3 12/16/2020   ALKPHOS 125 (H) 12/16/2020   AST 19 12/16/2020   ALT 17 12/16/2020   PROT 6.8 12/16/2020   ALBUMIN 4.0 12/16/2020   CALCIUM 9.1 12/16/2020   ANIONGAP 7 01/10/2017   Lab Results  Component Value Date   CHOL 129 12/16/2020   Lab Results  Component Value Date   HDL 26 (L) 12/16/2020   Lab Results  Component Value Date   LDLCALC 55 12/16/2020   Lab Results  Component Value Date   TRIG 311 (H) 12/16/2020   Lab Results  Component Value Date   CHOLHDL 5.0 12/16/2020   Lab Results  Component Value Date   HGBA1C 6.8 (H) 12/16/2020       Assessment & Plan:  1. COPD exacerbation ([place]) Start on levaquin and prednisone taper.  Recommend use duoneb four times a day for 48 hours, then return to four times a day prn.  2. Other urinary incontinence Recommend patient call back to neurology to notify them of the issue.  3. Frequent falls Concerned secondary to ataxia related to spinal cord stenosis.  Seen neurology.  4. Other dysphagia We will obtain results.  Per the wife the technician was concerned the dysphagia was neurological related.  Meds ordered this encounter  Medications  . levofloxacin (LEVAQUIN) 750 MG tablet    Sig: Take 1 tablet (750 mg total) by mouth daily.    Dispense:  7 tablet    Refill:  0  . predniSONE (DELTASONE) 20 MG tablet    Sig: Take 3 tablets (60 mg total) by mouth daily with breakfast for 3 days, THEN 2 tablets (40 mg total) daily with breakfast for 3 days, THEN 1 tablet (20 mg total) daily with breakfast for 3 days.    Dispense:  18 tablet    Refill:  0   Follow-up: No follow-ups on file.  An After Visit Summary was printed and given to the patient.  [name], MD [facility] [phone]

## 2021-02-15 ENCOUNTER — Other Ambulatory Visit: Payer: Medicare Other

## 2021-02-15 ENCOUNTER — Other Ambulatory Visit: Payer: Self-pay

## 2021-02-15 ENCOUNTER — Telehealth: Payer: Self-pay

## 2021-02-15 MED ORDER — SPIRIVA RESPIMAT 2.5 MCG/ACT IN AERS: 2.0000 | INHALATION_SPRAY | Freq: Every day | RESPIRATORY_TRACT | 2 refills | Status: DC

## 2021-02-15 MED ORDER — BREO ELLIPTA 100-25 MCG/INH IN AEPB: INHALATION_SPRAY | RESPIRATORY_TRACT | 2 refills | Status: DC

## 2021-02-15 NOTE — Telephone Encounter (Signed)
Don [name] sent e-mail stating colonoscopy that was scheduled for patient has been canceled. "She is fully aware he needs colonoscopy due to having one in 1998". However he refuses and has some dementia (per spouse). Patient does wish to continue with his EGD and dilation.

## 2021-02-21 DIAGNOSIS — R1310 Dysphagia, unspecified: Secondary | ICD-10-CM | POA: Diagnosis not present

## 2021-02-23 ENCOUNTER — Other Ambulatory Visit: Payer: Self-pay | Admitting: Family Medicine

## 2021-03-07 ENCOUNTER — Other Ambulatory Visit: Payer: Self-pay | Admitting: Legal Medicine

## 2021-03-07 ENCOUNTER — Other Ambulatory Visit: Payer: Self-pay | Admitting: Family Medicine

## 2021-03-10 ENCOUNTER — Other Ambulatory Visit: Payer: Medicare Other

## 2021-04-05 ENCOUNTER — Other Ambulatory Visit: Payer: Self-pay

## 2021-04-06 ENCOUNTER — Other Ambulatory Visit: Payer: Medicare Other

## 2021-04-06 MED ORDER — HYDROCODONE-ACETAMINOPHEN 5-325 MG PO TABS: 1.0000 | ORAL_TABLET | Freq: Three times a day (TID) | ORAL | 0 refills | Status: DC | PRN

## 2021-04-07 ENCOUNTER — Other Ambulatory Visit: Payer: Self-pay | Admitting: Legal Medicine

## 2021-04-18 ENCOUNTER — Other Ambulatory Visit: Payer: Self-pay | Admitting: Family Medicine

## 2021-04-19 ENCOUNTER — Ambulatory Visit: Payer: Medicare Other | Admitting: Neurology

## 2021-04-26 ENCOUNTER — Other Ambulatory Visit: Payer: Self-pay | Admitting: Family Medicine

## 2021-04-29 ENCOUNTER — Other Ambulatory Visit: Payer: Self-pay | Admitting: Physician Assistant

## 2021-04-29 ENCOUNTER — Other Ambulatory Visit: Payer: Self-pay | Admitting: Family Medicine

## 2021-04-29 DIAGNOSIS — E782 Mixed hyperlipidemia: Secondary | ICD-10-CM

## 2021-04-29 DIAGNOSIS — K2100 Gastro-esophageal reflux disease with esophagitis, without bleeding: Secondary | ICD-10-CM

## 2021-05-02 ENCOUNTER — Other Ambulatory Visit: Payer: Self-pay | Admitting: Family Medicine

## 2021-05-04 ENCOUNTER — Other Ambulatory Visit: Payer: Medicare Other

## 2021-05-13 DIAGNOSIS — H9222 Otorrhagia, left ear: Secondary | ICD-10-CM | POA: Diagnosis not present

## 2021-05-13 HISTORY — DX: Otorrhagia, left ear: H92.22

## 2021-05-19 ENCOUNTER — Ambulatory Visit: Payer: Medicare Other | Admitting: Family Medicine

## 2021-05-20 ENCOUNTER — Other Ambulatory Visit: Payer: Self-pay

## 2021-05-20 ENCOUNTER — Ambulatory Visit (INDEPENDENT_AMBULATORY_CARE_PROVIDER_SITE_OTHER): Payer: Medicare Other | Admitting: Family Medicine

## 2021-05-20 VITALS — BP 120/68 | HR 80 | Temp 97.2°F | Ht 70.0 in | Wt 195.6 lb

## 2021-05-20 DIAGNOSIS — N401 Enlarged prostate with lower urinary tract symptoms: Secondary | ICD-10-CM

## 2021-05-20 DIAGNOSIS — I10 Essential (primary) hypertension: Secondary | ICD-10-CM

## 2021-05-20 DIAGNOSIS — N50811 Right testicular pain: Secondary | ICD-10-CM | POA: Diagnosis not present

## 2021-05-20 DIAGNOSIS — E782 Mixed hyperlipidemia: Secondary | ICD-10-CM

## 2021-05-20 DIAGNOSIS — N451 Epididymitis: Secondary | ICD-10-CM

## 2021-05-20 DIAGNOSIS — E1142 Type 2 diabetes mellitus with diabetic polyneuropathy: Secondary | ICD-10-CM | POA: Diagnosis not present

## 2021-05-20 DIAGNOSIS — I110 Hypertensive heart disease with heart failure: Secondary | ICD-10-CM | POA: Diagnosis not present

## 2021-05-20 DIAGNOSIS — R350 Frequency of micturition: Secondary | ICD-10-CM

## 2021-05-20 DIAGNOSIS — N50812 Left testicular pain: Secondary | ICD-10-CM

## 2021-05-20 MED ORDER — TAMSULOSIN HCL 0.4 MG PO CAPS
0.8000 mg | ORAL_CAPSULE | Freq: Every day | ORAL | 1 refills | Status: DC
Start: 2021-05-20 — End: 2021-10-25

## 2021-05-20 MED ORDER — LEVOFLOXACIN 750 MG PO TABS: 750.0000 mg | ORAL_TABLET | Freq: Every day | ORAL | 0 refills | Status: DC

## 2021-05-20 MED ORDER — FINASTERIDE 5 MG PO TABS: 5.0000 mg | ORAL_TABLET | Freq: Every day | ORAL | 0 refills | Status: DC

## 2021-05-20 MED ORDER — PREDNISONE 10 MG PO TABS: ORAL_TABLET | ORAL | 0 refills | Status: AC

## 2021-05-20 NOTE — Progress Notes (Signed)
Acute Office Visit  Subjective:    Patient ID: Don [name], male    DOB: [birth year], 70 y.o.   MRN: [id number]  Chief Complaint  Patient presents with   Urinary Retention    HPI Patient is in today for painful testicles. Urinating 5-6 times per night. Slow to start. Does not feel like he empties bladder well.  Urologist: Dr. Chrystine Oiler, erectile pump. Treated peyronnies done by Dr. Emi Holes. Sugars 133-150s.  NO falls in the last month. Pt is ambulating better.   Past Medical History:  Diagnosis Date   Arthritis    Asymptomatic LV dysfunction 10/18/2017   Basal cell carcinoma    Benign prostatic hyperplasia with incomplete bladder emptying 01/14/2019   Bone pain 03/08/2020   Cancer ([place])    throat - 1997, throat - 2018   Cardiomyopathy, secondary ([place]) 12/01/2016   Overview:  EF 47% 12/26/16   Chest pain syndrome 11/02/2017   Chronic coronary artery disease 10/18/2017   CKD (chronic kidney disease) stage 3, GFR 30-59 ml/min (HCC) 07/25/2019   COPD (chronic obstructive pulmonary disease) ([place])    Coronary artery disease    Coronary artery disease involving native coronary artery of native heart with angina pectoris ([place]) 12/01/2016   Overview:  He has hx of IWMI in remote past, last cath in 2012 at [facility] showed chronic total occlusion of previously stented RCA, with good collaterals, a 40% LAD stenosis, inferior hypokinesis and EF 45%.    He's been lost to Cardiology f/u since 2015, but has not had any recurrent events   Depression    Diabetes mellitus ([place]) 03/08/2020   Diverticulitis 05/02/2020   Dyslipidemia 12/01/2016   Emphysema lung ([place])    Erectile dysfunction due to diseases classified elsewhere 06/12/2019   Essential hypertension 10/18/2017   GERD (gastroesophageal reflux disease)    Hoarseness 11/15/2016   Hyperlipidemia    Hypertension    Laceration of thumb, left 12/17/2015   Lesion of vocal cord    Leukoplakia of larynx 11/15/2016   Long-term use of aspirin therapy  10/02/2018   Malfunction of penile prosthesis ([place]) 01/14/2019   Melanoma of back ([place])    melanoma on back   Memory loss 03/08/2020   Myalgia 03/08/2020   Myocardial infarction [facility]) 2000   2 stents   PAD (peripheral artery disease) ([place]) 10/18/2017   Peripheral vascular disease ([place])    iliac artery clot   Peyronie's disease 06/12/2019   Pharyngoesophageal dysphagia 08/12/2018   Pneumonia 02/2015   Pneumothorax 06/07/2020   Preoperative clearance 04/03/2018   Squamous cell carcinoma of larynx ([place]) 12/13/2016   Testicular pain, left 01/14/2019   Throat cancer ([place])    Tobacco use disorder 06/12/2019   Wears glasses     Past Surgical History:  Procedure Laterality Date   ABDOMINAL ANGIOGRAM N/A 01/13/2015   Procedure: ABDOMINAL ANGIOGRAM;  Surgeon: Rosetta Posner, MD;  Location: [facility] CATH LAB;  Service: Cardiovascular;  Laterality: N/A;   APPENDECTOMY     BACK SURGERY     CARDIAC CATHETERIZATION  2000/2012   with stents in [place]     bilateral   ESOPHAGOGASTRODUODENOSCOPY     KNEE SURGERY Left    MICROLARYNGOSCOPY WITH CO2 LASER AND EXCISION OF VOCAL CORD LESION N/A 11/23/2016   Procedure: MICROLARYNGOSCOPY  AND EXCISION OF VOCAL CORD LESION;  Surgeon: Melissa Montane, MD;  Location: [place];  Service: ENT;  Laterality: N/A;  MICROLARYNGOSCOPY WITH CO2 LASER AND EXCISION OF VOCAL CORD LESION N/A 01/11/2017   Procedure: MICROLARYNGOSCOPY WITH CO2 LASER AND EXCISION OF VOCAL CORD LESION;  Surgeon: Melissa Montane, MD;  Location: [place];  Service: ENT;  Laterality: N/A;   MICROLARYNGOSCOPY WITH LASER N/A 03/16/2015   Procedure: MICROLARYNGOSCOPY ;  Surgeon: Melissa Montane, MD;  Location: [facility] OR;  Service: ENT;  Laterality: N/A;   peyronie's surgery     SHOULDER SURGERY Right    rotator cuff   SPINE SURGERY  09/2020   cervical surgery. steel plate, bone spurs, allograft.   THROAT SURGERY  1997   cancer removed    Family History  Problem Relation Age of Onset    Cancer Mother        bone   Hypertension Mother    Stroke Father    Hypertension Father    Healthy Child    Cancer Other        brain   Diabetes Other     Social History   Socioeconomic History   Marital status: Married    Spouse name: Not on file   Number of children: 4   Years of education: Not on file   Highest education level: GED or equivalent  Occupational History   Occupation: retired  Tobacco Use   Smoking status: Every Day    Packs/day: 1.00    Years: 50.00    Pack years: 50.00    Types: Cigarettes   Smokeless tobacco: Never   Tobacco comments:    down to .5 ppd  Vaping Use   Vaping Use: Former  Substance and Sexual Activity   Alcohol use: No    Alcohol/week: 0.0 standard drinks   Drug use: No   Sexual activity: Not on file  Other Topics Concern   Not on file  Social History Narrative   Lives with wife       One level      Right hand   Social Determinants of Health   Financial Resource Strain: Not on file  Food Insecurity: Not on file  Transportation Needs: Not on file  Physical Activity: Not on file  Stress: Not on file  Social Connections: Not on file  Intimate Partner Violence: Not on file    Outpatient Medications Prior to Visit  Medication Sig Dispense Refill   ALPRAZolam (XANAX) 0.25 MG tablet TAKE 1 TABLET BY MOUTH ONCE DAILY AS NEEDED FOR ANXIETY 30 tablet 0   amLODipine (NORVASC) 5 MG tablet TAKE 1 TABLET BY MOUTH ONCE DAILY . APPOINTMENT REQUIRED FOR FUTURE REFILLS 30 tablet 0   aspirin 81 MG tablet Take 162 mg by mouth daily.      BREO ELLIPTA 100-25 MCG/INH AEPB INHALE 1 PUFF BY MOUTH ONCE DAILY 60 each 2   clopidogrel (PLAVIX) 75 MG tablet Take 1 tablet by mouth once daily 90 tablet 1   DULoxetine (CYMBALTA) 60 MG capsule Take 1 capsule by mouth twice daily 180 capsule 0   gabapentin (NEURONTIN) 400 MG capsule TAKE 1 CAPSULE BY MOUTH THREE TIMES DAILY 90 capsule 0   ipratropium-albuterol (DUONEB) 0.5-2.5 (3) MG/3ML SOLN Take 3  mLs by nebulization every 6 (six) hours as needed (for shortness of breath/wheezing. (with bronchitis)).     metFORMIN (GLUCOPHAGE) 500 MG tablet Take 1 tablet by mouth twice daily 180 tablet 0   metoprolol tartrate (LOPRESSOR) 50 MG tablet Take 1 tablet by mouth twice daily 180 tablet 0   nitroGLYCERIN (NITROSTAT) 0.4 MG SL tablet Place 0.4  mg under the tongue every 5 (five) minutes as needed for chest pain.     nystatin cream (MYCOSTATIN) Apply 1 application topically 2 (two) times daily as needed for dry skin.     Omega-3 Fatty Acids (FISH OIL) 1000 MG CAPS Take 2 capsules (2,000 mg total) by mouth in the morning and at bedtime. 180 capsule 12   omeprazole (PRILOSEC) 20 MG capsule Take 1 capsule by mouth once daily 90 capsule 1   ondansetron (ZOFRAN-ODT) 8 MG disintegrating tablet PLACE 1 TABLET ON TOP OF THE TONGUE WHERE IT WILL DISSOLVE, THEN SWALLOW BY TRANSLINGUAL ROUTE 1 HOUR BEFORE INDUCTION OF ANESTHESIA 26 tablet 0   rosuvastatin (CRESTOR) 40 MG tablet Take 1 tablet by mouth once daily 90 tablet 1   SPIRIVA RESPIMAT 2.5 MCG/ACT AERS Inhale 2 puffs into the lungs daily. 4 g 2   TOUJEO SOLOSTAR 300 UNIT/ML Solostar Pen INJECT 30 UNITS SUBCUTANEOUSLY ONCE DAILY 6 mL 2   traZODone (DESYREL) 150 MG tablet Take 1 tablet by mouth once daily 90 tablet 2   VENTOLIN HFA 108 (90 Base) MCG/ACT inhaler Inhale 2 puffs into the lungs as needed for shortness of breath or wheezing.  0   cyclobenzaprine (FLEXERIL) 10 MG tablet TAKE 1 TABLET BY MOUTH THREE TIMES DAILY AS NEEDED FOR  BACK  SPASM 90 tablet 0   HYDROcodone-acetaminophen (NORCO/VICODIN) 5-325 MG tablet Take 1 tablet by mouth every 8 (eight) hours as needed for moderate pain. 90 tablet 0   tamsulosin (FLOMAX) 0.4 MG CAPS capsule TAKE 1 CAPSULE BY MOUTH ONCE DAILY AT BEDTIME     acetaminophen (TYLENOL) 325 MG tablet Take 650 mg by mouth every 4 (four) hours as needed for mild pain.     levofloxacin (LEVAQUIN) 750 MG tablet Take 1 tablet (750 mg  total) by mouth daily. 7 tablet 0   lisinopril-hydrochlorothiazide (ZESTORETIC) 10-12.5 MG tablet Take 1 tablet by mouth daily. 90 tablet 0   No facility-administered medications prior to visit.    Allergies  Allergen Reactions   Penicillins Rash and Hives    Has patient had a PCN reaction causing immediate rash, facial/tongue/throat swelling, SOB or lightheadedness with hypotension:unsure Has patient had a PCN reaction causing severe rash involving mucus membranes or skin necrosis:unsure Has patient had a PCN reaction that required hospitalization:No Has patient had a PCN reaction occurring within the last 10 years:No If all of the above answers are "NO", then may proceed with Cephalosporin use.  rash   Prednisone     Depressed mood - can tolerate it, but it makes him very irritable   Doxycycline Rash   Iodinated Diagnostic Agents Rash    Also developed blisters Also developed blisters    Review of Systems  Constitutional:  Positive for fatigue (same as last visit). Negative for chills and fever.  HENT:  Positive for congestion (chronic) and ear pain (blood in ear, has drops from ent). Negative for nosebleeds, sinus pressure and sore throat.   Eyes:  Negative for visual disturbance.  Respiratory:  Positive for cough and shortness of breath. Negative for chest tightness.   Cardiovascular:  Negative for chest pain and palpitations.  Gastrointestinal:  Positive for abdominal distention (felt this way for the last 6 months). Negative for abdominal pain, constipation, diarrhea, nausea and vomiting.  Endocrine: Negative for polydipsia and polyphagia.  Genitourinary:  Positive for difficulty urinating (during the day, at night he goes 5-6 times), frequency (at night only), testicular pain (sore to touch) and urgency (at  night).  Musculoskeletal:  Positive for back pain (chronic). Negative for arthralgias and joint swelling.  Neurological:  Positive for weakness. Negative for dizziness,  numbness and headaches.  Psychiatric/Behavioral:  Negative for agitation. The patient is not nervous/anxious.       Objective:    Physical Exam Exam conducted with a chaperone present.  Constitutional:      Appearance: Normal appearance.  HENT:     Right Ear: Tympanic membrane, ear canal and external ear normal.     Left Ear: Tympanic membrane, ear canal and external ear normal.     Nose: Nose normal. No congestion or rhinorrhea.     Mouth/Throat:     Mouth: Mucous membranes are moist.     Pharynx: No oropharyngeal exudate or posterior oropharyngeal erythema.  Cardiovascular:     Rate and Rhythm: Normal rate and regular rhythm.     Heart sounds: Normal heart sounds.  Pulmonary:     Effort: Pulmonary effort is normal. No respiratory distress.     Breath sounds: Wheezing (occasionally, sporadic.) present. No rhonchi or rales.  Abdominal:     General: Bowel sounds are normal.     Palpations: Abdomen is soft.     Tenderness: There is no abdominal tenderness.     Hernia: There is no hernia in the left inguinal area or right inguinal area.  Genitourinary:    Penis: Normal. No swelling or lesions.      Testes:        Right: Mass or swelling not present.        Left: Mass or swelling not present.     Epididymis:     Right: Not inflamed or enlarged. Tenderness present.     Left: Not inflamed or enlarged. No tenderness.     Prostate: Enlarged.     Rectum: No mass or tenderness.     Comments: Posterior to testes: pump can be palpated. If palpated in a certain way, pt develops discomfort similar to sharp pain he gets at home. It is very difficult to elicit the pain. Lymphadenopathy:     Cervical: No cervical adenopathy.  Neurological:     Mental Status: He is alert.  Psychiatric:        Mood and Affect: Mood normal.        Behavior: Behavior normal.    BP 120/68   Pulse 80   Temp (!) 97.2 F (36.2 C)   Ht 5\' 10"  (1.778 m)   Wt 195 lb 9.6 oz (88.7 kg)   SpO2 95%   BMI 28.07  kg/m  Wt Readings from Last 3 Encounters:  05/20/21 195 lb 9.6 oz (88.7 kg)  12/15/20 196 lb 3.2 oz (89 kg)  12/01/20 177 lb (80.3 kg)    Health Maintenance Due  Topic Date Due   FOOT EXAM  Never done   OPHTHALMOLOGY EXAM  Never done   Hepatitis C Screening  Never done   Zoster Vaccines- Shingrix (1 of 2) Never done   TETANUS/TDAP  11/13/2006   COLONOSCOPY (Pts 45-7yrs Insurance coverage will need to be confirmed)  10/27/2007   PNA vac Low Risk Adult (2 of 2 - PPSV23) 09/30/2016   COVID-19 Vaccine (2 - Moderna risk series) 11/09/2020    There are no preventive care reminders to display for this patient.   Lab Results  Component Value Date   TSH 2.630 12/16/2020   Lab Results  Component Value Date   WBC 9.7 12/16/2020   HGB 13.8 12/16/2020  HCT 41.4 12/16/2020   MCV 89 12/16/2020   PLT 230 12/16/2020   Lab Results  Component Value Date   NA 141 12/16/2020   K 4.5 12/16/2020   CO2 30 (H) 12/16/2020   GLUCOSE 143 (H) 12/16/2020   BUN 10 12/16/2020   CREATININE 0.87 12/16/2020   BILITOT 0.3 12/16/2020   ALKPHOS 125 (H) 12/16/2020   AST 19 12/16/2020   ALT 17 12/16/2020   PROT 6.8 12/16/2020   ALBUMIN 4.0 12/16/2020   CALCIUM 9.1 12/16/2020   ANIONGAP 7 01/10/2017   Lab Results  Component Value Date   CHOL 129 12/16/2020   Lab Results  Component Value Date   HDL 26 (L) 12/16/2020   Lab Results  Component Value Date   LDLCALC 55 12/16/2020   Lab Results  Component Value Date   TRIG 311 (H) 12/16/2020   Lab Results  Component Value Date   CHOLHDL 5.0 12/16/2020   Lab Results  Component Value Date   HGBA1C 6.8 (H) 12/16/2020       Assessment & Plan:   1. Diabetic polyneuropathy associated with type 2 diabetes mellitus ([place]) Pt is overdue for labs.  Pt to return for fasting labs.  Continue current medications.  Continue healthy eating and exercise as he is able.  - Hemoglobin A1c; Future  2. Hypertensive heart disease with heart failure  ([place]) The current medical regimen is effective;  continue present plan and medications. - CBC with Differential/Platelet; Future - Comprehensive metabolic panel; Future - TSH; Future  3. Mixed hyperlipidemia Trigs very high at last lab check.  Pt to return for labwork.  - Lipid panel; Future  4. Pain in both testicles I believe he is sitting in a manner that is pinching a nerve in his posterior scrotum where the pump is located. Pt is established with urology. His wife agreed to call to get him an appointment.   5. Benign prostatic hyperplasia with urinary frequency Start finasteride 5 mg once daily.  Increase tamsulosin 0.4 mg 2 daily at night.  - PSA; Future  6. COPD: pt is traveling [place] and requested a levaquin rx and predpack, in case he worsens. He feels he is starting to have his bronchitis. Lungs sound fairly clear for him. He understands if he worsens and medicine is not helping, pt is to go to urgent care or ED.  Meds ordered this encounter  Medications   finasteride (PROSCAR) 5 MG tablet    Sig: Take 1 tablet (5 mg total) by mouth daily.    Dispense:  90 tablet    Refill:  0   tamsulosin (FLOMAX) 0.4 MG CAPS capsule    Sig: Take 2 capsules (0.8 mg total) by mouth daily after supper.    Dispense:  90 capsule    Refill:  1   levofloxacin (LEVAQUIN) 750 MG tablet    Sig: Take 1 tablet (750 mg total) by mouth daily.    Dispense:  7 tablet    Refill:  0   predniSONE (DELTASONE) 10 MG tablet    Sig: Take 6 tablets (60 mg total) by mouth daily with breakfast for 1 day, THEN 5 tablets (50 mg total) daily with breakfast for 1 day, THEN 4 tablets (40 mg total) daily with breakfast for 1 day, THEN 3 tablets (30 mg total) daily with breakfast for 1 day, THEN 2 tablets (20 mg total) daily with breakfast for 1 day, THEN 1 tablet (10 mg total) daily with breakfast for 1  day.    Dispense:  21 tablet    Refill:  0   Follow up for labs and in 1 month.   [name], MD

## 2021-05-23 ENCOUNTER — Telehealth: Payer: Self-pay

## 2021-05-23 ENCOUNTER — Ambulatory Visit (INDEPENDENT_AMBULATORY_CARE_PROVIDER_SITE_OTHER): Payer: Medicare Other

## 2021-05-23 ENCOUNTER — Other Ambulatory Visit: Payer: Self-pay

## 2021-05-23 DIAGNOSIS — E1142 Type 2 diabetes mellitus with diabetic polyneuropathy: Secondary | ICD-10-CM | POA: Diagnosis not present

## 2021-05-23 LAB — POCT URINALYSIS DIPSTICK
Bilirubin, UA: NEGATIVE
Blood, UA: NEGATIVE
Glucose, UA: NEGATIVE
Ketones, UA: NEGATIVE
Leukocytes, UA: NEGATIVE
Nitrite, UA: NEGATIVE
Protein, UA: POSITIVE — AB
Spec Grav, UA: 1.01 (ref 1.010–1.025)
Urobilinogen, UA: 1 E.U./dL
pH, UA: 7.5 (ref 5.0–8.0)

## 2021-05-23 LAB — POCT UA - MICROALBUMIN: Microalbumin Ur, POC: 30 mg/L

## 2021-05-23 MED ORDER — HYDROCODONE-ACETAMINOPHEN 5-325 MG PO TABS: 1.0000 | ORAL_TABLET | Freq: Three times a day (TID) | ORAL | 0 refills | Status: DC | PRN

## 2021-05-23 NOTE — Telephone Encounter (Signed)
Patient's UA/Microalbumin results are in, patient's wife dropped off urine this morning and wanted [place] to call her back to let her know the results. Please Advise if there needs to be any changes?

## 2021-05-23 NOTE — Telephone Encounter (Signed)
Pt will be travelling out of town.   Don [name], [place] 05/23/21 2:28 PM

## 2021-06-01 ENCOUNTER — Other Ambulatory Visit: Payer: Self-pay | Admitting: Family Medicine

## 2021-06-01 ENCOUNTER — Other Ambulatory Visit: Payer: Self-pay | Admitting: Physician Assistant

## 2021-06-02 ENCOUNTER — Other Ambulatory Visit: Payer: Self-pay

## 2021-06-03 ENCOUNTER — Encounter: Payer: Self-pay | Admitting: Family Medicine

## 2021-06-03 ENCOUNTER — Other Ambulatory Visit: Payer: Self-pay

## 2021-06-03 ENCOUNTER — Ambulatory Visit (INDEPENDENT_AMBULATORY_CARE_PROVIDER_SITE_OTHER): Payer: Medicare Other

## 2021-06-03 DIAGNOSIS — I25119 Atherosclerotic heart disease of native coronary artery with unspecified angina pectoris: Secondary | ICD-10-CM | POA: Diagnosis not present

## 2021-06-03 DIAGNOSIS — I10 Essential (primary) hypertension: Secondary | ICD-10-CM

## 2021-06-03 LAB — ECHOCARDIOGRAM COMPLETE
Area-P 1/2: 2.78 cm2
S' Lateral: 3.4 cm

## 2021-06-03 NOTE — Progress Notes (Signed)
Complete echocardiogram performed.  Jimmy Maridel Pixler RDCS, RVT  

## 2021-06-06 ENCOUNTER — Other Ambulatory Visit: Payer: Self-pay

## 2021-06-06 ENCOUNTER — Other Ambulatory Visit: Payer: Self-pay | Admitting: Physician Assistant

## 2021-06-06 ENCOUNTER — Other Ambulatory Visit: Payer: Self-pay | Admitting: Family Medicine

## 2021-06-06 MED ORDER — METOPROLOL TARTRATE 50 MG PO TABS: 50.0000 mg | ORAL_TABLET | Freq: Two times a day (BID) | ORAL | 0 refills | Status: DC

## 2021-06-06 MED ORDER — GABAPENTIN 400 MG PO CAPS: 400.0000 mg | ORAL_CAPSULE | Freq: Three times a day (TID) | ORAL | 0 refills | Status: DC

## 2021-06-16 ENCOUNTER — Other Ambulatory Visit: Payer: Self-pay | Admitting: Family Medicine

## 2021-06-16 ENCOUNTER — Telehealth: Payer: Self-pay | Admitting: Family Medicine

## 2021-06-16 MED ORDER — LEVOFLOXACIN 750 MG PO TABS: 750.0000 mg | ORAL_TABLET | Freq: Every day | ORAL | 0 refills | Status: DC

## 2021-06-16 NOTE — Telephone Encounter (Signed)
Patient has fever to 101.7. Sxs began today. Exposed to covid 19 two days ago. He has a productive cough and mild sob. Pulse ox 97% on RA. No sore throat, earache, chest pain.  Completed levaquin and prednisone given 2-3 weeks ago.  Instructed Anne Ng to perform covid 19 test at home and call me back.  Will complete video visit tomorrow. kc

## 2021-06-16 NOTE — Telephone Encounter (Signed)
Negative covid 19.  Still has a fever.  Sent levaquin.

## 2021-06-24 ENCOUNTER — Telehealth: Payer: Medicare Other | Admitting: Family Medicine

## 2021-06-27 ENCOUNTER — Encounter: Payer: Self-pay | Admitting: Family Medicine

## 2021-06-30 ENCOUNTER — Encounter: Payer: Self-pay | Admitting: Family Medicine

## 2021-06-30 ENCOUNTER — Telehealth (INDEPENDENT_AMBULATORY_CARE_PROVIDER_SITE_OTHER): Payer: Medicare Other | Admitting: Family Medicine

## 2021-06-30 VITALS — BP 124/70 | HR 67 | Ht 69.0 in | Wt 198.0 lb

## 2021-06-30 DIAGNOSIS — J449 Chronic obstructive pulmonary disease, unspecified: Secondary | ICD-10-CM

## 2021-06-30 DIAGNOSIS — J9611 Chronic respiratory failure with hypoxia: Secondary | ICD-10-CM | POA: Diagnosis not present

## 2021-06-30 DIAGNOSIS — U071 COVID-19: Secondary | ICD-10-CM | POA: Diagnosis not present

## 2021-06-30 DIAGNOSIS — J988 Other specified respiratory disorders: Secondary | ICD-10-CM

## 2021-06-30 MED ORDER — NIRMATRELVIR/RITONAVIR (PAXLOVID)TABLET: ORAL_TABLET | ORAL | 0 refills | Status: DC

## 2021-06-30 NOTE — Progress Notes (Addendum)
Virtual Visit via Telephone Note   This visit type was conducted due to national recommendations for restrictions regarding the COVID-19 Pandemic (e.g. social distancing) in an effort to limit this patient's exposure and mitigate transmission in our community.  Due to his co-morbid illnesses, this patient is at least at moderate risk for complications without adequate follow up.  This format is felt to be most appropriate for this patient at this time.  The patient did not have access to video technology/had technical difficulties with video requiring transitioning to audio format only (telephone).  All issues noted in this document were discussed and addressed.  No physical exam could be performed with this format.  Patient verbally consented to a telehealth visit.   Date:  06/30/2021   ID:  Don [name], DOB May 04, 1951, MRN [id number]  Patient Location: Home Provider Location: Office/Clinic  PCP:  [name], MD   Evaluation Performed:  acute visit  Chief Complaint:  covid positive.   History of Present Illness:    Don [name] is a 70 y.o. male with covid + sxs yesterday. Wife was positive on Monday. C/o low grade, cough. Not SOB over baseline. Phlegm is thick. Oxygen level drops normally when sleeping sometimes to 87%. Using 3 L at night. Not using oxygen during the day > 90%. Pt is doing very well in light of his copd.  Has had 2 Positive home covid 19 rapid tests. Started mucinex.  The patient does have symptoms concerning for COVID-19 infection (fever, chills, cough, or new shortness of breath).    Past Medical History:  Diagnosis Date   Arthritis    Asymptomatic LV dysfunction 10/18/2017   Basal cell carcinoma    Benign prostatic hyperplasia with incomplete bladder emptying 01/14/2019   Bone pain 03/08/2020   Cancer ([place])    throat - 1997, throat - 2018   Cardiomyopathy, secondary ([place]) 12/01/2016   Overview:  EF 47% 12/26/16   Chest pain syndrome 11/02/2017    Chronic coronary artery disease 10/18/2017   CKD (chronic kidney disease) stage 3, GFR 30-59 ml/min (HCC) 07/25/2019   COPD (chronic obstructive pulmonary disease) ([place])    Coronary artery disease    Coronary artery disease involving native coronary artery of native heart with angina pectoris ([place]) 12/01/2016   Overview:  He has hx of IWMI in remote past, last cath in 2012 at [facility] showed chronic total occlusion of previously stented RCA, with good collaterals, a 40% LAD stenosis, inferior hypokinesis and EF 45%.    He's been lost to Cardiology f/u since 2015, but has not had any recurrent events   Depression    Diabetes mellitus ([place]) 03/08/2020   Diverticulitis 05/02/2020   Dyslipidemia 12/01/2016   Emphysema lung ([place])    Erectile dysfunction due to diseases classified elsewhere 06/12/2019   Essential hypertension 10/18/2017   GERD (gastroesophageal reflux disease)    Hoarseness 11/15/2016   Hyperlipidemia    Hypertension    Laceration of thumb, left 12/17/2015   Lesion of vocal cord    Leukoplakia of larynx 11/15/2016   Long-term use of aspirin therapy 10/02/2018   Malfunction of penile prosthesis ([place]) 01/14/2019   Melanoma of back ([place])    melanoma on back   Memory loss 03/08/2020   Myalgia 03/08/2020   Myocardial infarction [facility]) 2000   2 stents   PAD (peripheral artery disease) ([place]) 10/18/2017   Peripheral vascular disease ([place])    iliac artery clot   Peyronie's disease 06/12/2019  Pharyngoesophageal dysphagia 08/12/2018   Pneumonia 02/2015   Pneumothorax 06/07/2020   Preoperative clearance 04/03/2018   Squamous cell carcinoma of larynx ([place]) 12/13/2016   Testicular pain, left 01/14/2019   Throat cancer ([place])    Tobacco use disorder 06/12/2019   Wears glasses     Past Surgical History:  Procedure Laterality Date   ABDOMINAL ANGIOGRAM N/A 01/13/2015   Procedure: ABDOMINAL ANGIOGRAM;  Surgeon: Rosetta Posner, MD;  Location: [facility] CATH LAB;  Service: Cardiovascular;  Laterality: N/A;   APPENDECTOMY      BACK SURGERY     CARDIAC CATHETERIZATION  2000/2012   with stents in [place]     bilateral   ESOPHAGOGASTRODUODENOSCOPY     KNEE SURGERY Left    MICROLARYNGOSCOPY WITH CO2 LASER AND EXCISION OF VOCAL CORD LESION N/A 11/23/2016   Procedure: MICROLARYNGOSCOPY  AND EXCISION OF VOCAL CORD LESION;  Surgeon: Melissa Montane, MD;  Location: [place];  Service: ENT;  Laterality: N/A;   MICROLARYNGOSCOPY WITH CO2 LASER AND EXCISION OF VOCAL CORD LESION N/A 01/11/2017   Procedure: MICROLARYNGOSCOPY WITH CO2 LASER AND EXCISION OF VOCAL CORD LESION;  Surgeon: Melissa Montane, MD;  Location: [place];  Service: ENT;  Laterality: N/A;   MICROLARYNGOSCOPY WITH LASER N/A 03/16/2015   Procedure: MICROLARYNGOSCOPY ;  Surgeon: Melissa Montane, MD;  Location: [facility] OR;  Service: ENT;  Laterality: N/A;   peyronie's surgery     SHOULDER SURGERY Right    rotator cuff   SPINE SURGERY  09/2020   cervical surgery. steel plate, bone spurs, allograft.   THROAT SURGERY  1997   cancer removed    Family History  Problem Relation Age of Onset   Cancer Mother        bone   Hypertension Mother    Stroke Father    Hypertension Father    Healthy Child    Cancer Other        brain   Diabetes Other     Social History   Socioeconomic History   Marital status: Married    Spouse name: Not on file   Number of children: 4   Years of education: Not on file   Highest education level: GED or equivalent  Occupational History   Occupation: retired  Tobacco Use   Smoking status: Every Day    Packs/day: 1.00    Years: 50.00    Pack years: 50.00    Types: Cigarettes   Smokeless tobacco: Never   Tobacco comments:    down to .5 ppd  Vaping Use   Vaping Use: Former  Substance and Sexual Activity   Alcohol use: No    Alcohol/week: 0.0 standard drinks   Drug use: No   Sexual activity: Not on file  Other Topics Concern   Not on file  Social History Narrative   Lives with wife        One level      Right hand   Social Determinants of Health   Financial Resource Strain: Not on file  Food Insecurity: Not on file  Transportation Needs: Not on file  Physical Activity: Not on file  Stress: Not on file  Social Connections: Not on file  Intimate Partner Violence: Not on file    Outpatient Medications Prior to Visit  Medication Sig Dispense Refill   ALPRAZolam (XANAX) 0.25 MG tablet TAKE 1 TABLET BY MOUTH ONCE DAILY AS NEEDED FOR ANXIETY 30  tablet 0   amLODipine (NORVASC) 5 MG tablet TAKE 1 TABLET BY MOUTH ONCE DAILY . APPOINTMENT REQUIRED FOR FUTURE REFILLS 30 tablet 0   aspirin 81 MG tablet Take 162 mg by mouth daily.      BREO ELLIPTA 100-25 MCG/INH AEPB INHALE 1 PUFF BY MOUTH ONCE DAILY 60 each 2   clopidogrel (PLAVIX) 75 MG tablet Take 1 tablet by mouth once daily 90 tablet 1   cyclobenzaprine (FLEXERIL) 10 MG tablet TAKE 1 TABLET BY MOUTH THREE TIMES DAILY AS NEEDED FOR  BACK  SPASM 90 tablet 0   DULoxetine (CYMBALTA) 60 MG capsule Take 1 capsule by mouth twice daily 180 capsule 0   finasteride (PROSCAR) 5 MG tablet Take 1 tablet (5 mg total) by mouth daily. 90 tablet 0   gabapentin (NEURONTIN) 400 MG capsule Take 1 capsule (400 mg total) by mouth 3 (three) times daily. 90 capsule 0   HYDROcodone-acetaminophen (NORCO/VICODIN) 5-325 MG tablet Take 1 tablet by mouth every 8 (eight) hours as needed for moderate pain. 90 tablet 0   ipratropium-albuterol (DUONEB) 0.5-2.5 (3) MG/3ML SOLN Take 3 mLs by nebulization every 6 (six) hours as needed (for shortness of breath/wheezing. (with bronchitis)).     metFORMIN (GLUCOPHAGE) 500 MG tablet Take 1 tablet by mouth twice daily 180 tablet 0   metoprolol tartrate (LOPRESSOR) 50 MG tablet Take 1 tablet (50 mg total) by mouth 2 (two) times daily. 180 tablet 0   nitroGLYCERIN (NITROSTAT) 0.4 MG SL tablet Place 0.4 mg under the tongue every 5 (five) minutes as needed for chest pain.     nystatin cream (MYCOSTATIN) Apply 1 application  topically 2 (two) times daily as needed for dry skin.     Omega-3 Fatty Acids (FISH OIL) 1000 MG CAPS Take 2 capsules (2,000 mg total) by mouth in the morning and at bedtime. 180 capsule 12   omeprazole (PRILOSEC) 20 MG capsule Take 1 capsule by mouth once daily 90 capsule 1   ondansetron (ZOFRAN-ODT) 8 MG disintegrating tablet PLACE 1 TABLET ON TOP OF THE TONGUE WHERE IT WILL DISSOLVE, THEN SWALLOW BY TRANSLINGUAL ROUTE 1 HOUR BEFORE INDUCTION OF ANESTHESIA 26 tablet 0   rosuvastatin (CRESTOR) 40 MG tablet Take 1 tablet by mouth once daily 90 tablet 1   SPIRIVA RESPIMAT 2.5 MCG/ACT AERS Inhale 2 puffs into the lungs daily. 4 g 2   tamsulosin (FLOMAX) 0.4 MG CAPS capsule Take 2 capsules (0.8 mg total) by mouth daily after supper. 90 capsule 1   TOUJEO SOLOSTAR 300 UNIT/ML Solostar Pen INJECT 30 UNITS SUBCUTANEOUSLY ONCE DAILY 6 mL 2   traZODone (DESYREL) 150 MG tablet Take 1 tablet by mouth once daily 90 tablet 2   VENTOLIN HFA 108 (90 Base) MCG/ACT inhaler Inhale 2 puffs into the lungs as needed for shortness of breath or wheezing.  0   levofloxacin (LEVAQUIN) 750 MG tablet Take 1 tablet (750 mg total) by mouth daily. 7 tablet 0   No facility-administered medications prior to visit.    Allergies:   Penicillins, Prednisone, Doxycycline, and Iodinated diagnostic agents   Social History   Tobacco Use   Smoking status: Every Day    Packs/day: 1.00    Years: 50.00    Pack years: 50.00    Types: Cigarettes   Smokeless tobacco: Never   Tobacco comments:    down to .5 ppd  Vaping Use   Vaping Use: Former  Substance Use Topics   Alcohol use: No    Alcohol/week: 0.0  standard drinks   Drug use: No     Review of Systems  Constitutional:  Positive for fever (low grade 100.4). Negative for chills and malaise/fatigue.  HENT:  Negative for congestion and sore throat.   Respiratory:  Positive for cough. Negative for shortness of breath.   Cardiovascular:  Negative for chest pain.     Labs/Other Tests and Data Reviewed:    Recent Labs: 12/16/2020: ALT 17; BUN 10; Creatinine, Ser 0.87; Hemoglobin 13.8; Platelets 230; Potassium 4.5; Sodium 141; TSH 2.630   Recent Lipid Panel Lab Results  Component Value Date/Time   CHOL 129 12/16/2020 09:13 AM   TRIG 311 (H) 12/16/2020 09:13 AM   HDL 26 (L) 12/16/2020 09:13 AM   CHOLHDL 5.0 12/16/2020 09:13 AM   LDLCALC 55 12/16/2020 09:13 AM    Wt Readings from Last 3 Encounters:  06/30/21 198 lb (89.8 kg)  05/20/21 195 lb 9.6 oz (88.7 kg)  12/15/20 196 lb 3.2 oz (89 kg)     Objective:    Vital Signs:  BP 124/70   Pulse 67   Ht '5\' 9"'$  (1.753 m)   Wt 198 lb (89.8 kg)   SpO2 94%   BMI 29.24 kg/m    Physical Exam  Sleeping with oxygen.  ASSESSMENT & PLAN:   1. Respiratory tract infection due to COVID-19 virus Start paxlovid.  Call back if worsening and will give prednisone taper and levaquin.  Using inhalers and nebulizers.  No one in house, who is not positive.  - nirmatrelvir/ritonavir EUA (PAXLOVID) 20 x 150 MG & 10 x '100MG'$  TABS; (Take nirmatrelvir 150 mg two tablets twice daily for 5 days and ritonavir 100 mg one tablet twice daily for 5 days) Patient GFR is 90  Dispense: 30 tablet; Refill: 0   2. Chronic obstructive pulmonary disease, unspecified COPD type ([place]) The patient has COPD and chronic respiratory failure, a severe and life-threatening disease state.  Noninvasive positive pressure ventilation is essential therapy for the patient's chronic condition since [place] unless she can accommodate a dual prescription and is able to measure the volumes pressures accurately.  Close monitoring with the NIV will be most efficient to maintain the patient's respiratory status and avoid future hospital stays.  BiPAP and CPAP do not have these capabilities and have been ruled out.  NIPPV will provide mouthpiece ventilation.  BiPAP ST does not have this capability due to the severity of the patient's condition the absence of NIPPV  can increase patient's PCO2 and could result in hospital readmission and possible death.  3. Chronic respiratory failure with hypoxia ([place]) See above.   Meds ordered this encounter  Medications   nirmatrelvir/ritonavir EUA (PAXLOVID) 20 x 150 MG & 10 x '100MG'$  TABS    Sig: (Take nirmatrelvir 150 mg two tablets twice daily for 5 days and ritonavir 100 mg one tablet twice daily for 5 days) Patient GFR is 90    Dispense:  30 tablet    Refill:  0    COVID-19 Education: The signs and symptoms of COVID-19 were discussed with the patient and how to seek care for testing (follow up with PCP or arrange E-visit). The importance of social distancing was discussed today.  I spent 10 minutes dedicated to the care of this patient on the date of this encounter to include face-to-face time. Follow Up:  In Person prn  Signed,  [name], MD  06/30/2021 10:06 AM    [place]

## 2021-07-04 ENCOUNTER — Telehealth: Payer: Self-pay

## 2021-07-04 ENCOUNTER — Other Ambulatory Visit: Payer: Self-pay | Admitting: Family Medicine

## 2021-07-04 MED ORDER — LEVOFLOXACIN 750 MG PO TABS: 750.0000 mg | ORAL_TABLET | Freq: Every day | ORAL | 0 refills | Status: DC

## 2021-07-04 MED ORDER — PREDNISONE 50 MG PO TABS: 50.0000 mg | ORAL_TABLET | Freq: Every day | ORAL | 0 refills | Status: DC

## 2021-07-04 MED ORDER — HYDROCODONE-ACETAMINOPHEN 5-325 MG PO TABS: 1.0000 | ORAL_TABLET | Freq: Three times a day (TID) | ORAL | 0 refills | Status: DC | PRN

## 2021-07-04 NOTE — Telephone Encounter (Signed)
Patient's wife called stating that the patient is on his last day of antivirals and was saying she was supposed to let you know if the patient is not fully better by today and felt like he needed antibiotic and steroid so you could send it in for him. She stated that she does feel like he needs it and also she will be leaving again on Friday and taking him with her to see her father. She was wanting to know if you could go ahead and send in him pain meds so she can have them ready to go with her when they go. So she is also requesting the refill on hydrocodone as well and wants all of this sent to [facility].

## 2021-07-04 NOTE — Telephone Encounter (Signed)
Made wife aware.   Royce Macadamia, [place] 07/04/21 4:01 PM

## 2021-07-05 ENCOUNTER — Other Ambulatory Visit: Payer: Self-pay | Admitting: Family Medicine

## 2021-07-05 ENCOUNTER — Other Ambulatory Visit: Payer: Self-pay | Admitting: Physician Assistant

## 2021-07-08 ENCOUNTER — Other Ambulatory Visit: Payer: Self-pay | Admitting: Family Medicine

## 2021-07-11 NOTE — Telephone Encounter (Signed)
Refill sent to pharmacy.   

## 2021-07-20 ENCOUNTER — Other Ambulatory Visit: Payer: Self-pay | Admitting: Family Medicine

## 2021-07-26 ENCOUNTER — Other Ambulatory Visit: Payer: Self-pay | Admitting: Physician Assistant

## 2021-07-29 ENCOUNTER — Other Ambulatory Visit: Payer: Self-pay | Admitting: Legal Medicine

## 2021-07-29 DIAGNOSIS — J449 Chronic obstructive pulmonary disease, unspecified: Secondary | ICD-10-CM

## 2021-07-29 MED ORDER — LEVOFLOXACIN 750 MG PO TABS: 750.0000 mg | ORAL_TABLET | Freq: Every day | ORAL | 0 refills | Status: DC

## 2021-08-04 ENCOUNTER — Other Ambulatory Visit: Payer: Self-pay | Admitting: Physician Assistant

## 2021-08-04 ENCOUNTER — Telehealth: Payer: Self-pay | Admitting: Oncology

## 2021-08-04 ENCOUNTER — Other Ambulatory Visit: Payer: Self-pay | Admitting: Family Medicine

## 2021-08-04 NOTE — Telephone Encounter (Signed)
Per [facility] Office Note - patient scheduled for 10/13 Labs, Follow Up

## 2021-08-17 ENCOUNTER — Telehealth: Payer: Self-pay

## 2021-08-17 DIAGNOSIS — R400 Somnolence: Secondary | ICD-10-CM

## 2021-08-17 NOTE — Telephone Encounter (Signed)
Don [name] called with concerns about Don [name].  He has been having daytime somnolence and increased fatigue since his recent bout with covid.  He is questioning the need for a home sleep study.  Dr. Tobie Poet agreed that this was probably indicated at this time.  Will order home sleep study.

## 2021-08-19 ENCOUNTER — Telehealth: Payer: Self-pay | Admitting: *Deleted

## 2021-08-19 NOTE — Chronic Care Management (AMB) (Signed)
  Chronic Care Management   Note  08/19/2021 Name: Don [name] MRN: [id number] DOB: 1951-07-30  Don [name] is a 70 y.o. year old male who is a primary care patient of Cox, Kirsten, MD. I reached out to [employer] by phone today in response to a referral sent by Mr. Don [name] PCP.  [name] was given information about Chronic Care Management services today including:  CCM service includes personalized support from designated clinical staff supervised by his physician, including individualized plan of care and coordination with other care providers 24/7 contact phone numbers for assistance for urgent and routine care needs. Service will only be billed when office clinical staff spend 20 minutes or more in a month to coordinate care. Only one practitioner may furnish and bill the service in a calendar month. The patient may stop CCM services at any time (effective at the end of the month) by phone call to the office staff. The patient is responsible for co-pay (up to 20% after annual deductible is met) if co-pay is required by the individual health plan.   Spouse A. Oyster DPR on file  verbally agreed to assistance and services provided by embedded care coordination/care management team today.  Follow up plan: Telephone appointment with care management team member scheduled for:08/30/21  [place] [facility]  Direct Dial: [phone]

## 2021-08-19 NOTE — Progress Notes (Signed)
[facility]  [address] [place],  [place]  [address] [phone]  Clinic Day:  08/25/2021  Referring physician: [name], MD  This document serves as a record of services personally performed by Hosie Poisson, MD. It was created on their behalf by [facility], a trained medical scribe. The creation of this record is based on the scribe's personal observations and the provider's statements to them.  CHIEF COMPLAINT:  CC: Leukocytosis  Current Treatment:  Surveillance   HISTORY OF PRESENT ILLNESS:  Don [name] is a 70 y.o. male with a history of leukocytosis.  He is aware of the white count being elevated for several months but he also has had 3 episodes of pneumonia and COPD.  On March 22nd, his white count was 14.6 with 66% neutrophils, 26% lymphocytes, 4% monocytes, and 2% eosinophils.  He has elevation of both the ANC and the absolute lymphocyte count.  Hemoglobin was 14.3 with an MCV of 90 and platelet count of a 190,000.  It was noted in the record that he has gradually had an increasing white blood count over the last 6 months.  I reviewed the hospital records and his white count was normal in 2017. On December 25th his white count was up to 18.4 and by February 5th it was 14.0.  He has not had corticosteroids since December.  However he does have cough productive of white sputum which is occasionally brown.  He denies any fevers or chills, but has had night sweats for a long time.  He is still on antibiotics now and notes that his dyspnea is worse, but denies hemoptysis or wheezing.  He does continue to smoke.  He has reported pulmonary nodules and so I reviewed his serial CT scans.  He tells me this was first found in [place] in 2002 but he denies living near the [place].  In April of 2017 he had multiple nodular opacities in the right lung up to 10 mm and follow-up was recommended.  He tells me he did have a PET scan many  years ago which was negative.  A repeat CT of the chest in December of 2018 revealed stable right pulmonary lung nodules which remain similar and up to 10 mm.  He had repeat CT of the chest in April of 2019, which once again shows multiple nodules with occasional new ones and occasional ones that appears slightly larger.  It was recommended that this be repeated in 3 months.  I reviewed these images with the patient and his wife at the time of my consultation in April of 2019. At that time he had a mild leukocytosis which was stable and had a normal differential, so I did not pursue further investigation.  He does have a history of a melanoma of the back in 2016 treated with wide excision.  He also has had multiple superficial cancers of the larynx treated with laser by the ENT surgeon.  He was seen again in June 2021, and was found to have low B12 and iron levels. B12 level was low at 199 in 2021 with a normal folate level.  His iron studies were consistent with iron deficiency, with a ferritin of 9.0, iron level of 42 with a TIBC of 408 for a saturation of 10.2. He was advised to start B12 injections and oral iron.    INTERVAL HISTORY:  [name] is here for annual follow up and states that he has been having  flare ups of his COPD. He has required 4 courses of antibiotics and 4 courses of steroids. He does have oxygen at home. He is back on B12 injections once weekly for the past 8 months. He continues oral iron supplement daily. He denies melena, epistaxis, rectal bleeding, hematuria or other forms of overt bleeding. He denies pica to ice or soreness of the tongue. He does report fatigue and extremity weakness. His legs will "give out" which causes falls and occasionally he will drop things. He also has had headaches.  We reviewed previous MRI imaging of the cervical and lumbar spine from 2021 which shows significant degenerative changes including spinal stenosis. He will have intermittent abdominal  pain/tenderness. He underwent EGD in April 2022 with Dr. Melina Copa which revealed a normal exam. Hemoglobin has decreased from 13.2 to 11.4 with an MCV of 75, and white count and platelets are normal. We will repeat iron studies, B12 and folate today. Chemistries are unremarkable. His  appetite is good, and he has lost 6 pounds since his last visit.  He denies fever, chills or other signs of infection.  He denies nausea, vomiting or bowel issues.  He denies sore throat, cough or chest pain.  REVIEW OF SYSTEMS:  Review of Systems  Constitutional:  Positive for fatigue. Negative for appetite change, chills, fever and unexpected weight change.  HENT:   Positive for trouble swallowing (intermittent).   Eyes: Negative.   Respiratory:  Positive for shortness of breath (flare ups of COPD/emphysema). Negative for chest tightness, cough, hemoptysis and wheezing.   Cardiovascular: Negative.  Negative for chest pain, leg swelling and palpitations.  Gastrointestinal:  Positive for abdominal pain (diffuse tenderness). Negative for abdominal distention, blood in stool, constipation, diarrhea, nausea and vomiting.  Endocrine: Negative.   Genitourinary: Negative.  Negative for difficulty urinating, dysuria, frequency and hematuria.   Musculoskeletal:  Negative for arthralgias, back pain, flank pain, gait problem and myalgias.  Skin: Negative.   Neurological:  Positive for extremity weakness (causing falls and intermittent issues with motor skills) and headaches (these usually occur before he has a fall). Negative for dizziness, gait problem, light-headedness, numbness, seizures and speech difficulty.  Hematological: Negative.   Psychiatric/Behavioral: Negative.  Negative for depression and sleep disturbance. The patient is not nervous/anxious.   All other systems reviewed and are negative.   VITALS:  Blood pressure 134/70, pulse 67, temperature 98.1 F (36.7 C), temperature source Oral, resp. rate 18, height 5\' 9"   (1.753 m), weight 195 lb 9.6 oz (88.7 kg), SpO2 95 %.  Wt Readings from Last 3 Encounters:  08/25/21 195 lb 9.6 oz (88.7 kg)  06/30/21 198 lb (89.8 kg)  05/20/21 195 lb 9.6 oz (88.7 kg)    Body mass index is 28.89 kg/m.  Performance status (ECOG): 2 - Symptomatic, <50% confined to bed  PHYSICAL EXAM:  Physical Exam Constitutional:      General: He is not in acute distress.    Appearance: Normal appearance. He is normal weight.  HENT:     Head: Normocephalic and atraumatic.  Eyes:     General: No scleral icterus.    Extraocular Movements: Extraocular movements intact.     Conjunctiva/sclera: Conjunctivae normal.     Pupils: Pupils are equal, round, and reactive to light.  Cardiovascular:     Rate and Rhythm: Normal rate and regular rhythm.     Pulses: Normal pulses.     Heart sounds: Normal heart sounds. No murmur heard.   No friction rub. No gallop.  Pulmonary:     Effort: Pulmonary effort is normal. No respiratory distress.     Breath sounds: Decreased breath sounds (marked, in all lung fields) present.  Abdominal:     General: Bowel sounds are normal. There is no distension.     Palpations: Abdomen is soft. There is no hepatomegaly, splenomegaly or mass.     Tenderness: There is abdominal tenderness in the epigastric area.  Musculoskeletal:        General: Normal range of motion.     Cervical back: Normal range of motion and neck supple.     Right lower leg: No edema.     Left lower leg: No edema.  Lymphadenopathy:     Cervical: No cervical adenopathy.  Skin:    General: Skin is warm and dry.  Neurological:     General: No focal deficit present.     Mental Status: He is alert and oriented to person, place, and time. Mental status is at baseline.  Psychiatric:        Mood and Affect: Mood normal.        Behavior: Behavior normal.        Thought Content: Thought content normal.        Judgment: Judgment normal.    LABS:   CBC Latest Ref Rng & Units 08/25/2021  12/16/2020 01/10/2017  WBC - 8.4 9.7 12.2(H)  Hemoglobin 13.5 - 17.5 11.4(A) 13.8 15.0  Hematocrit 41 - 53 36(A) 41.4 43.4  Platelets 150 - 399 237 230 195   CMP Latest Ref Rng & Units 08/25/2021 12/16/2020 03/18/2020  Glucose 65 - 99 mg/dL - 143(H) -  BUN 4 - 21 11 10  -  Creatinine 0.6 - 1.3 0.8 0.87 -  Sodium 137 - 147 139 141 -  Potassium 3.4 - 5.3 3.9 4.5 -  Chloride 99 - 108 102 100 -  CO2 13 - 22 30(A) 30(H) -  Calcium 8.7 - 10.7 9.0 9.1 -  Total Protein 6.0 - 8.5 g/dL - 6.8 6.3  Total Bilirubin 0.0 - 1.2 mg/dL - 0.3 -  Alkaline Phos 25 - 125 98 125(H) -  AST 14 - 40 20 19 -  ALT 10 - 40 12 17 -    Lab Results  Component Value Date   ALBUMINELP 3.2 03/18/2020   MSPIKE Not Observed 03/18/2020   No results found for: TIBC, FERRITIN, IRONPCTSAT No results found for: LDH  STUDIES:  No results found.   EXAM: 07/01/2020 MRI LUMBAR SPINE WITHOUT CONTRAST  TECHNIQUE: Multiplanar, multisequence MR imaging of the lumbar spine was performed. No intravenous contrast was administered.  COMPARISON: MRI of the lumbar spine April 16, 2017.  FINDINGS: Segmentation: Standard. Alignment: Physiologic. Vertebrae: No fracture, evidence of discitis, or bone lesion. Status post laminectomy at L4-5 and L5-S1. Conus medullaris and cauda equina: Conus extends to the L1 level. Conus and cauda equina appear normal. Paraspinal and other soft tissues: Left renal cysts. Disc levels: T12-L1: No spinal canal or neural foraminal stenosis. L1-2: Small left extraforaminal disc protrusion. No spinal canal or neural foraminal stenosis. L2-3: Shallow disc bulge and mild facet degenerative changes. No significant spinal canal or neural foraminal stenosis. L3-4: Shallow disc bulge prominent facet degenerative changes with ligamentum flavum redundancy resulting in mild to moderate spinal canal stenosis with narrowing of the bilateral subarticular zones, mild right and moderate left neural  foraminal. L4-5: Status post decompression. Shallow disc bulge with central annular tear facet degenerative changes resulting in narrowing of the bilateral  subarticular zones, mild right and moderate to severe left neural foraminal narrowing, progressed from prior MRI. L5-S1: Status post decompression. Shallow right asymmetric disc bulge and facet degenerative changes resulting in narrowing of the bilateral subarticular zones and mild to moderate left neural foraminal narrowing.  IMPRESSION: 1. No acute abnormality of the lumbar spine. 2. Postsurgical changes of laminectomy at L4-5 and L5-S1. 3. Mild to moderate spinal canal stenosis at L3-L4 with narrowing of the bilateral subarticular zones and moderate left neural foraminal narrowing. 4. Mild right and moderate to severe left neural foraminal narrowing at L4-L5. 5. Mild to moderate left neural foraminal narrowing at L5-S1.  EXAM: 07/23/2020 MRI CERVICAL SPINE WITHOUT CONTRAST  TECHNIQUE: Multiplanar, multisequence MR imaging of the cervical spine was performed. No intravenous contrast was administered.  COMPARISON: Prior MRI from 05/31/2019.  FINDINGS: Alignment: Straightening of the normal cervical lordosis. No listhesis. Vertebrae: Prior ACDF at C3-4 with solid arthrodesis. Vertebral body height maintained without acute or chronic fracture. Bone marrow signal intensity within normal limits. No worrisome osseous lesions. No abnormal marrow edema. Cord: Normal signal and morphology. Posterior Fossa, vertebral arteries, paraspinal tissues: Visualized brain and posterior fossa within normal limits. Craniocervical junction normal. Paraspinous and prevertebral soft tissues within normal limits. Normal intravascular flow voids seen within the vertebral arteries bilaterally. Disc levels: C2-C3: Mild left eccentric disc bulge with left-sided uncovertebral hypertrophy. Severe left with mild right facet degeneration. No spinal  stenosis. Severe left C3 foraminal narrowing. Right neural foramen remains patent. Appearance is stable. C3-C4: Prior fusion. No residual canal or foraminal stenosis. Mild bilateral facet hypertrophy noted. Appearance is stable. C4-C5: Mild disc bulge. A superimposed tiny left foraminal disc protrusion has regressed since previous. Superimposed mild to moderate facet hypertrophy. No significant spinal stenosis. Mild to moderate bilateral C5 foraminal narrowing, similar to previous. C5-C6: Mild diffuse disc bulge, slightly asymmetric to the left. Flattening and partial effacement of the ventral thecal sac with resultant mild spinal stenosis. Superimposed mild facet hypertrophy. Mild to moderate bilateral C6 foraminal narrowing. C6-C7: Degenerative intervertebral disc space narrowing with diffuse disc osteophyte complex, eccentric to the left. Broad-based lobulated posterior component flattens and effaces the ventral thecal sac, worse on the left. Secondary flattening of the ventral cord without cord signal changes. Moderate spinal stenosis, mildly progressed from previous. Moderate left worse than right C7 foraminal narrowing. C7-T1: Negative interspace. Mild to moderate facet hypertrophy. No stenosis or impingement. Visualized upper thoracic spine demonstrates no significant finding.  IMPRESSION: 1. Progressive degenerative disc osteophyte at C6-7 with resultant moderate spinal stenosis, with moderate left worse than right C7 foraminal narrowing. 2. Severe left C3 foraminal stenosis related to uncovertebral and facet disease, similar. 3. Mild disc bulging with facet hypertrophy at C4-5 and C5-6 with resultant mild-to-moderate bilateral C5 and C6 foraminal stenosis. 4. Prior ACDF at C3-4 without residual or recurrent stenosis.  EXAM: 08/02/2020 MRI HEAD WITHOUT CONTRAST  TECHNIQUE: Multiplanar, multiecho pulse sequences of the brain and surrounding structures were obtained  without intravenous contrast. COMPARISON: None.  FINDINGS: Brain: No acute infarction, hemorrhage, hydrocephalus, extra-axial collection or mass lesion. Small remote lacunar infarcts in the right cerebellum and right middle cerebellar peduncle. Mild diffuse cerebral atrophy. Mild T2/FLAIR hyperintense white matter lesions, compatible with the sequela of chronic microvascular ischemic disease. Vascular: Flow voids are grossly normal at the skull base. Skull and upper cervical spine: Craniocervical degenerative changes with pannus along the dorsal aspect of the dens. Sinuses/Orbits: Maxillary antrostomies. Small bilateral maxillary sinus retention cyst. Otherwise, sinuses are clear. No  acute orbital abnormality.. Other: Small cyst in the right fossa of Rosenmuller, likely mucosal retention cyst.  IMPRESSION: 1. No acute intracranial abnormality. 2. Small remote lacunar infarcts in the right cerebellum and right middle cerebellar peduncle.  HISTORY:   Allergies:  Allergies  Allergen Reactions  . Penicillins Rash and Hives    Has patient had a PCN reaction causing immediate rash, facial/tongue/throat swelling, SOB or lightheadedness with hypotension:unsure Has patient had a PCN reaction causing severe rash involving mucus membranes or skin necrosis:unsure Has patient had a PCN reaction that required hospitalization:No Has patient had a PCN reaction occurring within the last 10 years:No If all of the above answers are "NO", then may proceed with Cephalosporin use.  rash  . Prednisone     Depressed mood - can tolerate it, but it makes him very irritable  . Doxycycline Rash  . Iodinated Diagnostic Agents Rash    Also developed blisters Also developed blisters  . Penicillin G Rash  . Tramadol Rash    Current Medications: Current Outpatient Medications  Medication Sig Dispense Refill  . ALPRAZolam (XANAX) 0.25 MG tablet TAKE 1 TABLET BY MOUTH ONCE DAILY AS NEEDED FOR ANXIETY  30 tablet 2  . amLODipine (NORVASC) 5 MG tablet TAKE 1 TABLET BY MOUTH ONCE DAILY . APPOINTMENT REQUIRED FOR FUTURE REFILLS 30 tablet 0  . aspirin 81 MG tablet Take 162 mg by mouth daily.     Marland Kitchen BREO ELLIPTA 100-25 MCG/INH AEPB INHALE 1 PUFF BY MOUTH ONCE DAILY 60 each 2  . clopidogrel (PLAVIX) 75 MG tablet Take 1 tablet by mouth once daily 90 tablet 1  . cyclobenzaprine (FLEXERIL) 10 MG tablet TAKE 1 TABLET BY MOUTH THREE TIMES DAILY AS NEEDED FOR  BACK  SPASM 90 tablet 0  . DULoxetine (CYMBALTA) 60 MG capsule Take 1 capsule by mouth twice daily 180 capsule 1  . finasteride (PROSCAR) 5 MG tablet Take 1 tablet by mouth once daily 90 tablet 0  . gabapentin (NEURONTIN) 400 MG capsule TAKE 1 CAPSULE BY MOUTH THREE TIMES DAILY 90 capsule 1  . HYDROcodone-acetaminophen (NORCO/VICODIN) 5-325 MG tablet Take 1 tablet by mouth every 8 (eight) hours as needed for moderate pain. 90 tablet 0  . ipratropium-albuterol (DUONEB) 0.5-2.5 (3) MG/3ML SOLN Take 3 mLs by nebulization every 6 (six) hours as needed (for shortness of breath/wheezing. (with bronchitis)).    . metFORMIN (GLUCOPHAGE) 500 MG tablet Take 1 tablet by mouth twice daily 180 tablet 0  . metoprolol tartrate (LOPRESSOR) 50 MG tablet Take 1 tablet (50 mg total) by mouth 2 (two) times daily. 180 tablet 0  . nirmatrelvir/ritonavir EUA (PAXLOVID) 20 x 150 MG & 10 x 100MG  TABS (Take nirmatrelvir 150 mg two tablets twice daily for 5 days and ritonavir 100 mg one tablet twice daily for 5 days) Patient GFR is 90 30 tablet 0  . nitroGLYCERIN (NITROSTAT) 0.4 MG SL tablet Place 0.4 mg under the tongue every 5 (five) minutes as needed for chest pain.    Marland Kitchen nystatin cream (MYCOSTATIN) Apply 1 application topically 2 (two) times daily as needed for dry skin.    . Omega-3 Fatty Acids (FISH OIL) 1000 MG CAPS Take 2 capsules (2,000 mg total) by mouth in the morning and at bedtime. 180 capsule 12  . omeprazole (PRILOSEC) 20 MG capsule Take 1 capsule by mouth once daily  90 capsule 1  . rosuvastatin (CRESTOR) 40 MG tablet Take 1 tablet by mouth once daily 90 tablet 1  . SPIRIVA RESPIMAT  2.5 MCG/ACT AERS Inhale 2 puffs into the lungs daily. 4 g 2  . tamsulosin (FLOMAX) 0.4 MG CAPS capsule Take 2 capsules (0.8 mg total) by mouth daily after supper. 90 capsule 1  . TOUJEO SOLOSTAR 300 UNIT/ML Solostar Pen INJECT 30 UNITS SUBCUTANEOUSLY ONCE DAILY 6 mL 2  . traZODone (DESYREL) 150 MG tablet Take 1 tablet by mouth once daily 90 tablet 0  . VENTOLIN HFA 108 (90 Base) MCG/ACT inhaler Inhale 2 puffs into the lungs as needed for shortness of breath or wheezing.  0   No current facility-administered medications for this visit.     ASSESSMENT & PLAN:   Assessment:   1. History of melanoma of the back 5 years ago with no evidence of disease.  2. Multiple cancers of the larynx which have been superficial and treated with laser by the ENT surgeon.  Most recently back in 2018.    3. Multiple pulmonary nodules which have been present for years and are likely benign.  The largest is less than 10 mm.    4. Tobacco abuse.  He has advanced emphysema.  5.  Severe generalized bone pain of the upper back, neck, and right hip. He also complains of extremity weakness. Rheumatoid factor was negative and M-spike was negative for monoclonal spike and quantitative immunoglobulins are normal. Imaging has shown significant degenerative changes including spinal stenosis.  6.  Anemia, symptomatic. He continues oral iron supplement daily for a year and a half and is on weekly B12 injections. We will recheck iron studies, B12 and folate today. I suspect he is not absorbing iron supplement properly, and likely has chronic ongoing blood loss. If he remains iron deficient, we will arrange for IV iron.  7.  Epigastric tenderness. I advised that he follow up with Dr. Melina Copa again.  8.  Significant shortness of breath and emphysema. He has require 4 course of antibiotics and steroids recently.  His anemia is likely contributing to this as well.  Plan: He comes in today with multiple complaints and these were all reviewed and addressed. His intermittent extremity weakness is likely secondary to significant degenerative changes as seen on MRI imaging in 2021. I reviewed the results with them today. Iron studies, B12 and folate will be ordered for further evaluation of his worsening anemia. He is currently on oral supplement, but if require, we can arrange for IV iron supplement.  He had an EGD in April 2022 with Dr. Melina Copa, but with his anemia and epigastric tenderness, I recommend he follow up with him again. I recommend GI evaluation with repeat EGD and colonoscopy. We will bring him back in 1-2 months with CBC and CMP for repeat evaluation.  I do advise that we follow up with him at least annually.  He understands and agrees to this plan of care.     I provided 30 minutes of face-to-face time during this this encounter and > 50% was spent counseling as documented under my assessment and plan.    Derwood Kaplan, MD [facility] [address] [place] [address] Dept: [phone] Dept Fax: [phone]   I, Rita Ohara, am acting as scribe for Derwood Kaplan, MD  I have reviewed this report as typed by the medical scribe, and it is complete and accurate.  Hermina Barters

## 2021-08-22 ENCOUNTER — Other Ambulatory Visit: Payer: Self-pay

## 2021-08-22 MED ORDER — HYDROCODONE-ACETAMINOPHEN 5-325 MG PO TABS: 1.0000 | ORAL_TABLET | Freq: Three times a day (TID) | ORAL | 0 refills | Status: DC | PRN

## 2021-08-24 ENCOUNTER — Other Ambulatory Visit: Payer: Self-pay | Admitting: Oncology

## 2021-08-24 ENCOUNTER — Other Ambulatory Visit: Payer: Self-pay | Admitting: Family Medicine

## 2021-08-24 DIAGNOSIS — C4359 Malignant melanoma of other part of trunk: Secondary | ICD-10-CM

## 2021-08-25 ENCOUNTER — Inpatient Hospital Stay: Payer: Medicare Other | Admitting: Oncology

## 2021-08-25 ENCOUNTER — Inpatient Hospital Stay: Payer: Medicare Other | Attending: Oncology

## 2021-08-25 ENCOUNTER — Other Ambulatory Visit: Payer: Self-pay | Admitting: Hematology and Oncology

## 2021-08-25 ENCOUNTER — Other Ambulatory Visit: Payer: Self-pay | Admitting: Oncology

## 2021-08-25 ENCOUNTER — Encounter: Payer: Self-pay | Admitting: Oncology

## 2021-08-25 DIAGNOSIS — D539 Nutritional anemia, unspecified: Secondary | ICD-10-CM

## 2021-08-25 DIAGNOSIS — D500 Iron deficiency anemia secondary to blood loss (chronic): Secondary | ICD-10-CM

## 2021-08-25 DIAGNOSIS — Z720 Tobacco use: Secondary | ICD-10-CM | POA: Diagnosis not present

## 2021-08-25 DIAGNOSIS — R918 Other nonspecific abnormal finding of lung field: Secondary | ICD-10-CM | POA: Insufficient documentation

## 2021-08-25 DIAGNOSIS — R5383 Other fatigue: Secondary | ICD-10-CM | POA: Insufficient documentation

## 2021-08-25 DIAGNOSIS — E611 Iron deficiency: Secondary | ICD-10-CM | POA: Diagnosis not present

## 2021-08-25 DIAGNOSIS — D6489 Other specified anemias: Secondary | ICD-10-CM | POA: Diagnosis not present

## 2021-08-25 DIAGNOSIS — Z79899 Other long term (current) drug therapy: Secondary | ICD-10-CM | POA: Diagnosis not present

## 2021-08-25 DIAGNOSIS — J449 Chronic obstructive pulmonary disease, unspecified: Secondary | ICD-10-CM | POA: Diagnosis not present

## 2021-08-25 DIAGNOSIS — R519 Headache, unspecified: Secondary | ICD-10-CM | POA: Diagnosis not present

## 2021-08-25 DIAGNOSIS — C320 Malignant neoplasm of glottis: Secondary | ICD-10-CM | POA: Diagnosis not present

## 2021-08-25 DIAGNOSIS — M898X0 Other specified disorders of bone, multiple sites: Secondary | ICD-10-CM | POA: Insufficient documentation

## 2021-08-25 DIAGNOSIS — R10816 Epigastric abdominal tenderness: Secondary | ICD-10-CM | POA: Insufficient documentation

## 2021-08-25 DIAGNOSIS — J3800 Paralysis of vocal cords and larynx, unspecified: Secondary | ICD-10-CM | POA: Diagnosis not present

## 2021-08-25 DIAGNOSIS — Z85820 Personal history of malignant melanoma of skin: Secondary | ICD-10-CM | POA: Insufficient documentation

## 2021-08-25 DIAGNOSIS — Z8521 Personal history of malignant neoplasm of larynx: Secondary | ICD-10-CM | POA: Insufficient documentation

## 2021-08-25 DIAGNOSIS — H61892 Other specified disorders of left external ear: Secondary | ICD-10-CM | POA: Diagnosis not present

## 2021-08-25 DIAGNOSIS — R531 Weakness: Secondary | ICD-10-CM | POA: Diagnosis not present

## 2021-08-25 DIAGNOSIS — C4359 Malignant melanoma of other part of trunk: Secondary | ICD-10-CM

## 2021-08-25 DIAGNOSIS — D519 Vitamin B12 deficiency anemia, unspecified: Secondary | ICD-10-CM

## 2021-08-25 DIAGNOSIS — R1310 Dysphagia, unspecified: Secondary | ICD-10-CM | POA: Diagnosis not present

## 2021-08-25 DIAGNOSIS — D649 Anemia, unspecified: Secondary | ICD-10-CM | POA: Diagnosis not present

## 2021-08-25 HISTORY — DX: Iron deficiency anemia secondary to blood loss (chronic): D50.0

## 2021-08-25 HISTORY — DX: Nutritional anemia, unspecified: D53.9

## 2021-08-25 HISTORY — DX: Vitamin B12 deficiency anemia, unspecified: D51.9

## 2021-08-25 LAB — BASIC METABOLIC PANEL
BUN: 11 (ref 4–21)
CO2: 30 — AB (ref 13–22)
Chloride: 102 (ref 99–108)
Creatinine: 0.8 (ref 0.6–1.3)
Glucose: 156
Potassium: 3.9 (ref 3.4–5.3)
Sodium: 139 (ref 137–147)

## 2021-08-25 LAB — HEPATIC FUNCTION PANEL
ALT: 12 (ref 10–40)
AST: 20 (ref 14–40)
Alkaline Phosphatase: 98 (ref 25–125)
Bilirubin, Total: 0.3

## 2021-08-25 LAB — IRON AND TIBC
Iron: 28 ug/dL — ABNORMAL LOW (ref 45–182)
Saturation Ratios: 6 % — ABNORMAL LOW (ref 17.9–39.5)
TIBC: 446 ug/dL (ref 250–450)
UIBC: 418 ug/dL

## 2021-08-25 LAB — CBC AND DIFFERENTIAL
HCT: 36 — AB (ref 41–53)
Hemoglobin: 11.4 — AB (ref 13.5–17.5)
Neutrophils Absolute: 4.96
Platelets: 237 (ref 150–399)
WBC: 8.4

## 2021-08-25 LAB — COMPREHENSIVE METABOLIC PANEL
Albumin: 3.9 (ref 3.5–5.0)
Calcium: 9 (ref 8.7–10.7)

## 2021-08-25 LAB — CBC
MCV: 75 — AB (ref 80–94)
RBC: 4.74 (ref 3.87–5.11)

## 2021-08-25 LAB — FERRITIN: Ferritin: 7 ng/mL — ABNORMAL LOW (ref 24–336)

## 2021-08-25 LAB — FOLATE: Folate: 7 ng/mL (ref >5.9)

## 2021-08-25 LAB — VITAMIN B12: Vitamin B-12: 146 pg/mL — ABNORMAL LOW (ref 180–914)

## 2021-08-26 ENCOUNTER — Other Ambulatory Visit: Payer: Self-pay

## 2021-08-26 DIAGNOSIS — D539 Nutritional anemia, unspecified: Secondary | ICD-10-CM

## 2021-08-26 DIAGNOSIS — C4359 Malignant melanoma of other part of trunk: Secondary | ICD-10-CM

## 2021-08-29 ENCOUNTER — Encounter: Payer: Self-pay | Admitting: Oncology

## 2021-08-29 ENCOUNTER — Telehealth: Payer: Self-pay

## 2021-08-29 NOTE — Telephone Encounter (Addendum)
Appt's have been made and called to pt.  Derwood Kaplan, MD  Dairl Ponder, RN; Phy, Beverly Gust, [place]; Frederik Schmidt He has already been on B12 injections weekly but had missed some.  Looking at this level, he needs to stay on B12 weekly (gets through his PCP)  Needs IV iron and can stop the oral iron, he has been on it a long time and still very deficient    Hinton Rao, Chinita Pester, MD  Dairl Ponder, RN; Phy, Beverly Gust, [place]; Corene Cornea; Adline Mango Labs from 10/13 clearly show iron def. And B12 def.  He needs to be sched for IV iron and for B12 injections         Pt's wife states,  "Dr Hinton Rao mentioned giving my husband IV iron. I'm calling to get appointments".

## 2021-08-30 ENCOUNTER — Telehealth: Payer: Self-pay | Admitting: Oncology

## 2021-08-30 ENCOUNTER — Telehealth: Payer: Medicare Other

## 2021-08-30 ENCOUNTER — Encounter: Payer: Self-pay | Admitting: Oncology

## 2021-08-30 DIAGNOSIS — D500 Iron deficiency anemia secondary to blood loss (chronic): Secondary | ICD-10-CM | POA: Diagnosis not present

## 2021-08-30 DIAGNOSIS — Z1212 Encounter for screening for malignant neoplasm of rectum: Secondary | ICD-10-CM | POA: Diagnosis not present

## 2021-08-30 DIAGNOSIS — K5904 Chronic idiopathic constipation: Secondary | ICD-10-CM | POA: Diagnosis not present

## 2021-08-30 NOTE — Telephone Encounter (Signed)
Per 10/17 Staff Msg, patient scheduled for 3 rounds Venofer (per SP).  Scheduled Dec Labs, Follow Up Appt.  Spoke w/patient's spouse

## 2021-08-31 ENCOUNTER — Ambulatory Visit: Payer: Medicare Other

## 2021-08-31 ENCOUNTER — Other Ambulatory Visit: Payer: Self-pay | Admitting: Pharmacist

## 2021-08-31 DIAGNOSIS — H61899 Other specified disorders of external ear, unspecified ear: Secondary | ICD-10-CM | POA: Diagnosis not present

## 2021-08-31 DIAGNOSIS — D2222 Melanocytic nevi of left ear and external auricular canal: Secondary | ICD-10-CM | POA: Diagnosis not present

## 2021-08-31 DIAGNOSIS — D2322 Other benign neoplasm of skin of left ear and external auricular canal: Secondary | ICD-10-CM | POA: Diagnosis not present

## 2021-08-31 DIAGNOSIS — H61892 Other specified disorders of left external ear: Secondary | ICD-10-CM | POA: Diagnosis not present

## 2021-08-31 MED FILL — Iron Sucrose Inj 20 MG/ML (Fe Equiv): INTRAVENOUS | Qty: 15 | Status: AC

## 2021-09-01 ENCOUNTER — Ambulatory Visit (INDEPENDENT_AMBULATORY_CARE_PROVIDER_SITE_OTHER): Payer: Medicare Other

## 2021-09-01 ENCOUNTER — Other Ambulatory Visit: Payer: Self-pay

## 2021-09-01 ENCOUNTER — Inpatient Hospital Stay: Payer: Medicare Other

## 2021-09-01 VITALS — BP 132/73 | HR 75 | Temp 97.9°F | Resp 18 | Ht 69.0 in | Wt 200.0 lb

## 2021-09-01 DIAGNOSIS — Z23 Encounter for immunization: Secondary | ICD-10-CM

## 2021-09-01 DIAGNOSIS — M898X0 Other specified disorders of bone, multiple sites: Secondary | ICD-10-CM | POA: Diagnosis not present

## 2021-09-01 DIAGNOSIS — R918 Other nonspecific abnormal finding of lung field: Secondary | ICD-10-CM | POA: Diagnosis not present

## 2021-09-01 DIAGNOSIS — Z720 Tobacco use: Secondary | ICD-10-CM | POA: Diagnosis not present

## 2021-09-01 DIAGNOSIS — Z79899 Other long term (current) drug therapy: Secondary | ICD-10-CM | POA: Diagnosis not present

## 2021-09-01 DIAGNOSIS — D500 Iron deficiency anemia secondary to blood loss (chronic): Secondary | ICD-10-CM | POA: Diagnosis not present

## 2021-09-01 DIAGNOSIS — R5383 Other fatigue: Secondary | ICD-10-CM | POA: Diagnosis not present

## 2021-09-01 DIAGNOSIS — R10816 Epigastric abdominal tenderness: Secondary | ICD-10-CM | POA: Diagnosis not present

## 2021-09-01 DIAGNOSIS — R519 Headache, unspecified: Secondary | ICD-10-CM | POA: Diagnosis not present

## 2021-09-01 DIAGNOSIS — Z85820 Personal history of malignant melanoma of skin: Secondary | ICD-10-CM | POA: Diagnosis not present

## 2021-09-01 DIAGNOSIS — D649 Anemia, unspecified: Secondary | ICD-10-CM | POA: Diagnosis not present

## 2021-09-01 DIAGNOSIS — D6489 Other specified anemias: Secondary | ICD-10-CM | POA: Diagnosis not present

## 2021-09-01 DIAGNOSIS — R531 Weakness: Secondary | ICD-10-CM | POA: Diagnosis not present

## 2021-09-01 DIAGNOSIS — J449 Chronic obstructive pulmonary disease, unspecified: Secondary | ICD-10-CM | POA: Diagnosis not present

## 2021-09-01 DIAGNOSIS — E611 Iron deficiency: Secondary | ICD-10-CM | POA: Diagnosis not present

## 2021-09-01 DIAGNOSIS — Z8521 Personal history of malignant neoplasm of larynx: Secondary | ICD-10-CM | POA: Diagnosis not present

## 2021-09-01 MED ORDER — SODIUM CHLORIDE 0.9 % IV SOLN
300.0000 mg | Freq: Once | INTRAVENOUS | Status: AC
  Administered 2021-09-01: 300 mg via INTRAVENOUS
  Filled 2021-09-01: qty 10

## 2021-09-01 MED ORDER — SODIUM CHLORIDE 0.9 % IV SOLN: Freq: Once | INTRAVENOUS | Status: AC

## 2021-09-01 NOTE — Patient Instructions (Signed)

## 2021-09-06 ENCOUNTER — Other Ambulatory Visit: Payer: Self-pay | Admitting: Physician Assistant

## 2021-09-08 ENCOUNTER — Other Ambulatory Visit: Payer: Self-pay

## 2021-09-08 ENCOUNTER — Inpatient Hospital Stay: Payer: Medicare Other

## 2021-09-08 VITALS — BP 120/68 | HR 81 | Temp 97.6°F | Resp 18 | Ht 69.0 in | Wt 201.0 lb

## 2021-09-08 DIAGNOSIS — R918 Other nonspecific abnormal finding of lung field: Secondary | ICD-10-CM | POA: Diagnosis not present

## 2021-09-08 DIAGNOSIS — D500 Iron deficiency anemia secondary to blood loss (chronic): Secondary | ICD-10-CM

## 2021-09-08 DIAGNOSIS — D6489 Other specified anemias: Secondary | ICD-10-CM | POA: Diagnosis not present

## 2021-09-08 DIAGNOSIS — R519 Headache, unspecified: Secondary | ICD-10-CM | POA: Diagnosis not present

## 2021-09-08 DIAGNOSIS — R531 Weakness: Secondary | ICD-10-CM | POA: Diagnosis not present

## 2021-09-08 DIAGNOSIS — Z720 Tobacco use: Secondary | ICD-10-CM | POA: Diagnosis not present

## 2021-09-08 DIAGNOSIS — Z85820 Personal history of malignant melanoma of skin: Secondary | ICD-10-CM | POA: Diagnosis not present

## 2021-09-08 DIAGNOSIS — R5383 Other fatigue: Secondary | ICD-10-CM | POA: Diagnosis not present

## 2021-09-08 DIAGNOSIS — J449 Chronic obstructive pulmonary disease, unspecified: Secondary | ICD-10-CM | POA: Diagnosis not present

## 2021-09-08 DIAGNOSIS — M898X0 Other specified disorders of bone, multiple sites: Secondary | ICD-10-CM | POA: Diagnosis not present

## 2021-09-08 DIAGNOSIS — Z79899 Other long term (current) drug therapy: Secondary | ICD-10-CM | POA: Diagnosis not present

## 2021-09-08 DIAGNOSIS — Z8521 Personal history of malignant neoplasm of larynx: Secondary | ICD-10-CM | POA: Diagnosis not present

## 2021-09-08 DIAGNOSIS — E611 Iron deficiency: Secondary | ICD-10-CM | POA: Diagnosis not present

## 2021-09-08 DIAGNOSIS — R10816 Epigastric abdominal tenderness: Secondary | ICD-10-CM | POA: Diagnosis not present

## 2021-09-08 MED ORDER — SODIUM CHLORIDE 0.9 % IV SOLN
300.0000 mg | Freq: Once | INTRAVENOUS | Status: AC
  Administered 2021-09-08: 300 mg via INTRAVENOUS
  Filled 2021-09-08: qty 300

## 2021-09-08 MED ORDER — SODIUM CHLORIDE 0.9 % IV SOLN: Freq: Once | INTRAVENOUS | Status: AC

## 2021-09-08 NOTE — Progress Notes (Signed)
1514 pt d/c stable, did not want to wait the full 30 mins after venofer.

## 2021-09-08 NOTE — Patient Instructions (Signed)

## 2021-09-14 MED FILL — Iron Sucrose Inj 20 MG/ML (Fe Equiv): INTRAVENOUS | Qty: 15 | Status: AC

## 2021-09-15 ENCOUNTER — Inpatient Hospital Stay: Payer: Medicare Other | Attending: Oncology

## 2021-09-15 ENCOUNTER — Other Ambulatory Visit: Payer: Self-pay

## 2021-09-15 VITALS — BP 140/56 | HR 68 | Temp 97.7°F | Resp 18 | Ht 69.0 in | Wt 200.0 lb

## 2021-09-15 DIAGNOSIS — D500 Iron deficiency anemia secondary to blood loss (chronic): Secondary | ICD-10-CM

## 2021-09-15 DIAGNOSIS — D649 Anemia, unspecified: Secondary | ICD-10-CM | POA: Diagnosis not present

## 2021-09-15 DIAGNOSIS — D509 Iron deficiency anemia, unspecified: Secondary | ICD-10-CM | POA: Diagnosis not present

## 2021-09-15 MED ORDER — SODIUM CHLORIDE 0.9 % IV SOLN: Freq: Once | INTRAVENOUS | Status: AC

## 2021-09-15 MED ORDER — SODIUM CHLORIDE 0.9 % IV SOLN
300.0000 mg | Freq: Once | INTRAVENOUS | Status: AC
  Administered 2021-09-15: 300 mg via INTRAVENOUS
  Filled 2021-09-15: qty 300

## 2021-09-15 NOTE — Patient Instructions (Signed)

## 2021-09-22 ENCOUNTER — Other Ambulatory Visit: Payer: Self-pay | Admitting: Family Medicine

## 2021-10-03 ENCOUNTER — Other Ambulatory Visit: Payer: Self-pay | Admitting: Family Medicine

## 2021-10-03 ENCOUNTER — Other Ambulatory Visit: Payer: Self-pay | Admitting: Legal Medicine

## 2021-10-03 MED ORDER — HYDROCODONE-ACETAMINOPHEN 5-325 MG PO TABS: 1.0000 | ORAL_TABLET | Freq: Three times a day (TID) | ORAL | 0 refills | Status: DC | PRN

## 2021-10-05 DIAGNOSIS — J9610 Chronic respiratory failure, unspecified whether with hypoxia or hypercapnia: Secondary | ICD-10-CM | POA: Diagnosis not present

## 2021-10-05 DIAGNOSIS — J449 Chronic obstructive pulmonary disease, unspecified: Secondary | ICD-10-CM | POA: Diagnosis not present

## 2021-10-13 ENCOUNTER — Encounter: Payer: Self-pay | Admitting: Family Medicine

## 2021-10-13 ENCOUNTER — Other Ambulatory Visit: Payer: Self-pay | Admitting: Family Medicine

## 2021-10-18 ENCOUNTER — Telehealth: Payer: Self-pay | Admitting: Oncology

## 2021-10-18 NOTE — Telephone Encounter (Signed)
Informed patient's spouse that Dr Hinton Rao will be out-of-office 12/16 - Rescheduled Appt to 12/29 Labs 1:30 pm - Follow up 2:00 pm

## 2021-10-19 DIAGNOSIS — D500 Iron deficiency anemia secondary to blood loss (chronic): Secondary | ICD-10-CM | POA: Diagnosis not present

## 2021-10-19 DIAGNOSIS — R195 Other fecal abnormalities: Secondary | ICD-10-CM | POA: Diagnosis not present

## 2021-10-19 DIAGNOSIS — R1084 Generalized abdominal pain: Secondary | ICD-10-CM | POA: Diagnosis not present

## 2021-10-19 DIAGNOSIS — K5730 Diverticulosis of large intestine without perforation or abscess without bleeding: Secondary | ICD-10-CM | POA: Diagnosis not present

## 2021-10-19 LAB — HM COLONOSCOPY

## 2021-10-22 DIAGNOSIS — F0390 Unspecified dementia without behavioral disturbance: Secondary | ICD-10-CM | POA: Diagnosis not present

## 2021-10-22 DIAGNOSIS — R410 Disorientation, unspecified: Secondary | ICD-10-CM | POA: Diagnosis not present

## 2021-10-22 DIAGNOSIS — R109 Unspecified abdominal pain: Secondary | ICD-10-CM | POA: Diagnosis not present

## 2021-10-22 DIAGNOSIS — Z888 Allergy status to other drugs, medicaments and biological substances status: Secondary | ICD-10-CM | POA: Diagnosis not present

## 2021-10-22 DIAGNOSIS — J189 Pneumonia, unspecified organism: Secondary | ICD-10-CM | POA: Diagnosis not present

## 2021-10-22 DIAGNOSIS — Z9981 Dependence on supplemental oxygen: Secondary | ICD-10-CM | POA: Diagnosis not present

## 2021-10-22 DIAGNOSIS — Z880 Allergy status to penicillin: Secondary | ICD-10-CM | POA: Diagnosis not present

## 2021-10-22 DIAGNOSIS — Z794 Long term (current) use of insulin: Secondary | ICD-10-CM | POA: Diagnosis not present

## 2021-10-22 DIAGNOSIS — R404 Transient alteration of awareness: Secondary | ICD-10-CM | POA: Diagnosis not present

## 2021-10-22 DIAGNOSIS — I10 Essential (primary) hypertension: Secondary | ICD-10-CM | POA: Diagnosis not present

## 2021-10-22 DIAGNOSIS — Z79899 Other long term (current) drug therapy: Secondary | ICD-10-CM | POA: Diagnosis not present

## 2021-10-22 DIAGNOSIS — J159 Unspecified bacterial pneumonia: Secondary | ICD-10-CM | POA: Diagnosis not present

## 2021-10-22 DIAGNOSIS — Z792 Long term (current) use of antibiotics: Secondary | ICD-10-CM | POA: Diagnosis not present

## 2021-10-22 DIAGNOSIS — G9341 Metabolic encephalopathy: Secondary | ICD-10-CM | POA: Diagnosis not present

## 2021-10-22 DIAGNOSIS — Z7984 Long term (current) use of oral hypoglycemic drugs: Secondary | ICD-10-CM | POA: Diagnosis not present

## 2021-10-22 DIAGNOSIS — R0902 Hypoxemia: Secondary | ICD-10-CM | POA: Diagnosis not present

## 2021-10-22 DIAGNOSIS — F17210 Nicotine dependence, cigarettes, uncomplicated: Secondary | ICD-10-CM | POA: Diagnosis not present

## 2021-10-22 DIAGNOSIS — R739 Hyperglycemia, unspecified: Secondary | ICD-10-CM | POA: Diagnosis not present

## 2021-10-22 DIAGNOSIS — J440 Chronic obstructive pulmonary disease with acute lower respiratory infection: Secondary | ICD-10-CM | POA: Diagnosis not present

## 2021-10-22 DIAGNOSIS — R509 Fever, unspecified: Secondary | ICD-10-CM | POA: Diagnosis not present

## 2021-10-22 DIAGNOSIS — J9621 Acute and chronic respiratory failure with hypoxia: Secondary | ICD-10-CM | POA: Diagnosis not present

## 2021-10-22 DIAGNOSIS — J101 Influenza due to other identified influenza virus with other respiratory manifestations: Secondary | ICD-10-CM | POA: Diagnosis not present

## 2021-10-22 DIAGNOSIS — Z881 Allergy status to other antibiotic agents status: Secondary | ICD-10-CM | POA: Diagnosis not present

## 2021-10-23 ENCOUNTER — Encounter: Payer: Self-pay | Admitting: Family Medicine

## 2021-10-25 ENCOUNTER — Other Ambulatory Visit: Payer: Self-pay | Admitting: Family Medicine

## 2021-10-25 ENCOUNTER — Other Ambulatory Visit: Payer: Self-pay | Admitting: Legal Medicine

## 2021-10-25 ENCOUNTER — Other Ambulatory Visit: Payer: Self-pay | Admitting: Physician Assistant

## 2021-10-25 DIAGNOSIS — K2100 Gastro-esophageal reflux disease with esophagitis, without bleeding: Secondary | ICD-10-CM

## 2021-10-25 DIAGNOSIS — E782 Mixed hyperlipidemia: Secondary | ICD-10-CM

## 2021-10-28 ENCOUNTER — Ambulatory Visit: Payer: Medicare Other | Admitting: Oncology

## 2021-10-28 ENCOUNTER — Other Ambulatory Visit: Payer: Medicare Other

## 2021-11-01 ENCOUNTER — Other Ambulatory Visit: Payer: Self-pay | Admitting: Legal Medicine

## 2021-11-01 ENCOUNTER — Other Ambulatory Visit: Payer: Self-pay | Admitting: Family Medicine

## 2021-11-02 ENCOUNTER — Other Ambulatory Visit: Payer: Self-pay

## 2021-11-02 ENCOUNTER — Ambulatory Visit (INDEPENDENT_AMBULATORY_CARE_PROVIDER_SITE_OTHER): Payer: Medicare Other | Admitting: Nurse Practitioner

## 2021-11-02 ENCOUNTER — Encounter: Payer: Self-pay | Admitting: Nurse Practitioner

## 2021-11-02 VITALS — BP 102/58 | HR 71 | Temp 98.8°F | Resp 20 | Ht 70.0 in | Wt 194.2 lb

## 2021-11-02 DIAGNOSIS — J9611 Chronic respiratory failure with hypoxia: Secondary | ICD-10-CM

## 2021-11-02 DIAGNOSIS — J690 Pneumonitis due to inhalation of food and vomit: Secondary | ICD-10-CM | POA: Diagnosis not present

## 2021-11-02 NOTE — Progress Notes (Signed)
Established Patient Office Visit  Subjective:  Patient ID: Don [name], male    DOB: 1951-04-15  Age: 70 y.o. MRN: [id number]  CC: hospital follow-up  HPI Don [name] presents for hospital follow-up for aspiration pneumoniae and sepsis. He is accompanied by his spouse, Anne Ng, that helps supplement health history. Spouse tells me pt had an EGD/Colonoscopy performed at Dr Carmie End office on 10/20/21. He tolerated procedure without complications. States after returning home pt had 103 F temperature and became hypoxia. He has a history of COPD with O2 dependency. Spouse states he continued to deteriorate over the next few days. He was transported via EMS to [facility] on 10/22/21 with hypoxia.  Follow up Hospitalization  Patient was admitted to [facility] on 10/22/21 and discharged on 10/25/21. He was treated for aspiration pneumonia and sepsis. Treatment for this included IV antibiotics. He reports excellent compliance with treatment. He reports this condition is improved .  Past Medical History:  Diagnosis Date   Arthritis    Asymptomatic LV dysfunction 10/18/2017   Basal cell carcinoma    Benign prostatic hyperplasia with incomplete bladder emptying 01/14/2019   Bone pain 03/08/2020   Cancer ([place])    throat - 1997, throat - 2018   Cardiomyopathy, secondary ([place]) 12/01/2016   Overview:  EF 47% 12/26/16   Chest pain syndrome 11/02/2017   Chronic coronary artery disease 10/18/2017   CKD (chronic kidney disease) stage 3, GFR 30-59 ml/min (HCC) 07/25/2019   COPD (chronic obstructive pulmonary disease) ([place])    Coronary artery disease    Coronary artery disease involving native coronary artery of native heart with angina pectoris ([place]) 12/01/2016   Overview:  He has hx of IWMI in remote past, last cath in 2012 at [facility] showed chronic total occlusion of previously stented RCA, with good collaterals, a 40% LAD stenosis, inferior hypokinesis and EF 45%.    He's been lost to  Cardiology f/u since 2015, but has not had any recurrent events   Depression    Diabetes mellitus ([place]) 03/08/2020   Diverticulitis 05/02/2020   Dyslipidemia 12/01/2016   Emphysema lung ([place])    Erectile dysfunction due to diseases classified elsewhere 06/12/2019   Essential hypertension 10/18/2017   GERD (gastroesophageal reflux disease)    Hoarseness 11/15/2016   Hyperlipidemia    Hypertension    Laceration of thumb, left 12/17/2015   Lesion of vocal cord    Leukoplakia of larynx 11/15/2016   Long-term use of aspirin therapy 10/02/2018   Malfunction of penile prosthesis ([place]) 01/14/2019   Melanoma of back ([place])    melanoma on back   Memory loss 03/08/2020   Myalgia 03/08/2020   Myocardial infarction [facility]) 2000   2 stents   PAD (peripheral artery disease) ([place]) 10/18/2017   Peripheral vascular disease ([place])    iliac artery clot   Peyronie's disease 06/12/2019   Pharyngoesophageal dysphagia 08/12/2018   Pneumonia 02/2015   Pneumothorax 06/07/2020   Preoperative clearance 04/03/2018   Squamous cell carcinoma of larynx ([place]) 12/13/2016   Testicular pain, left 01/14/2019   Throat cancer ([place])    Tobacco use disorder 06/12/2019   Wears glasses     Past Surgical History:  Procedure Laterality Date   ABDOMINAL ANGIOGRAM N/A 01/13/2015   Procedure: ABDOMINAL ANGIOGRAM;  Surgeon: Rosetta Posner, MD;  Location[facility] CATH LAB;  Service: Cardiovascular;  Laterality: N/A;   APPENDECTOMY     BACK SURGERY     CARDIAC CATHETERIZATION  2000/2012   with stents  in [place]     bilateral   ESOPHAGOGASTRODUODENOSCOPY     KNEE SURGERY Left    MICROLARYNGOSCOPY WITH CO2 LASER AND EXCISION OF VOCAL CORD LESION N/A 11/23/2016   Procedure: MICROLARYNGOSCOPY  AND EXCISION OF VOCAL CORD LESION;  Surgeon: Melissa Montane, MD;  Location: [place];  Service: ENT;  Laterality: N/A;   MICROLARYNGOSCOPY WITH CO2 LASER AND EXCISION OF VOCAL CORD LESION N/A 01/11/2017   Procedure:  MICROLARYNGOSCOPY WITH CO2 LASER AND EXCISION OF VOCAL CORD LESION;  Surgeon: Melissa Montane, MD;  Location: [place];  Service: ENT;  Laterality: N/A;   MICROLARYNGOSCOPY WITH LASER N/A 03/16/2015   Procedure: MICROLARYNGOSCOPY ;  Surgeon: Melissa Montane, MD;  Location: [facility] OR;  Service: ENT;  Laterality: N/A;   peyronie's surgery     SHOULDER SURGERY Right    rotator cuff   SPINE SURGERY  09/2020   cervical surgery. steel plate, bone spurs, allograft.   THROAT SURGERY  1997   cancer removed    Family History  Problem Relation Age of Onset   Cancer Mother        bone   Hypertension Mother    Stroke Father    Hypertension Father    Healthy Child    Cancer Other        brain   Diabetes Other     Social History   Socioeconomic History   Marital status: Married    Spouse name: Not on file   Number of children: 4   Years of education: Not on file   Highest education level: GED or equivalent  Occupational History   Occupation: retired  Tobacco Use   Smoking status: Every Day    Packs/day: 1.00    Years: 50.00    Pack years: 50.00    Types: Cigarettes   Smokeless tobacco: Never   Tobacco comments:    down to .5 ppd  Vaping Use   Vaping Use: Former  Substance and Sexual Activity   Alcohol use: No    Alcohol/week: 0.0 standard drinks   Drug use: No   Sexual activity: Not on file  Other Topics Concern   Not on file  Social History Narrative   Lives with wife       One level      Right hand   Social Determinants of Health   Financial Resource Strain: Not on file  Food Insecurity: Not on file  Transportation Needs: Not on file  Physical Activity: Not on file  Stress: Not on file  Social Connections: Not on file  Intimate Partner Violence: Not on file    Outpatient Medications Prior to Visit  Medication Sig Dispense Refill   ALPRAZolam (XANAX) 0.25 MG tablet TAKE 1 TABLET BY MOUTH ONCE DAILY AS NEEDED FOR ANXIETY 30 tablet 0   cyclobenzaprine (FLEXERIL) 10 MG tablet  TAKE 1 TABLET BY MOUTH THREE TIMES DAILY AS NEEDED FOR  BACK  SPASM 90 tablet 0   amLODipine (NORVASC) 5 MG tablet TAKE 1 TABLET BY MOUTH ONCE DAILY . APPOINTMENT REQUIRED FOR FUTURE REFILLS 30 tablet 0   aspirin 81 MG tablet Take 162 mg by mouth daily.      BREO ELLIPTA 100-25 MCG/INH AEPB INHALE 1 PUFF BY MOUTH ONCE DAILY 60 each 2   clopidogrel (PLAVIX) 75 MG tablet Take 1 tablet by mouth once daily 90 tablet 2   DULoxetine (CYMBALTA) 60 MG capsule Take  1 capsule by mouth twice daily 180 capsule 1   finasteride (PROSCAR) 5 MG tablet Take 1 tablet by mouth once daily 90 tablet 0   gabapentin (NEURONTIN) 400 MG capsule TAKE 1 CAPSULE BY MOUTH THREE TIMES DAILY 90 capsule 0   HYDROcodone-acetaminophen (NORCO/VICODIN) 5-325 MG tablet Take 1 tablet by mouth every 8 (eight) hours as needed for moderate pain. 90 tablet 0   ipratropium-albuterol (DUONEB) 0.5-2.5 (3) MG/3ML SOLN Take 3 mLs by nebulization every 6 (six) hours as needed (for shortness of breath/wheezing. (with bronchitis)).     metFORMIN (GLUCOPHAGE) 500 MG tablet Take 1 tablet by mouth twice daily 180 tablet 0   metoprolol tartrate (LOPRESSOR) 50 MG tablet Take 1 tablet by mouth twice daily 180 tablet 0   nirmatrelvir/ritonavir EUA (PAXLOVID) 20 x 150 MG & 10 x 100MG  TABS (Take nirmatrelvir 150 mg two tablets twice daily for 5 days and ritonavir 100 mg one tablet twice daily for 5 days) Patient GFR is 90 30 tablet 0   nitroGLYCERIN (NITROSTAT) 0.4 MG SL tablet Place 0.4 mg under the tongue every 5 (five) minutes as needed for chest pain.     nystatin cream (MYCOSTATIN) Apply 1 application topically 2 (two) times daily as needed for dry skin.     Omega-3 Fatty Acids (FISH OIL) 1000 MG CAPS Take 2 capsules (2,000 mg total) by mouth in the morning and at bedtime. 180 capsule 12   omeprazole (PRILOSEC) 20 MG capsule Take 1 capsule by mouth once daily 90 capsule 0   rosuvastatin (CRESTOR) 40 MG tablet Take 1 tablet by mouth once daily 90 tablet  0   SPIRIVA RESPIMAT 2.5 MCG/ACT AERS Inhale 2 puffs into the lungs daily. 4 g 2   tamsulosin (FLOMAX) 0.4 MG CAPS capsule TAKE 2 CAPSULES BY MOUTH ONCE DAILY AFTER SUPPER 180 capsule 0   TOUJEO SOLOSTAR 300 UNIT/ML Solostar Pen INJECT 30 UNITS SUBCUTANEOUSLY ONCE DAILY 6 mL 2   traZODone (DESYREL) 150 MG tablet Take 1 tablet by mouth once daily 90 tablet 1   VENTOLIN HFA 108 (90 Base) MCG/ACT inhaler Inhale 2 puffs into the lungs as needed for shortness of breath or wheezing.  0   No facility-administered medications prior to visit.    Allergies  Allergen Reactions   Penicillins Rash and Hives    Has patient had a PCN reaction causing immediate rash, facial/tongue/throat swelling, SOB or lightheadedness with hypotension:unsure Has patient had a PCN reaction causing severe rash involving mucus membranes or skin necrosis:unsure Has patient had a PCN reaction that required hospitalization:No Has patient had a PCN reaction occurring within the last 10 years:No If all of the above answers are "NO", then may proceed with Cephalosporin use.  rash   Prednisone     Depressed mood - can tolerate it, but it makes him very irritable   Doxycycline Rash   Iodinated Diagnostic Agents Rash    Also developed blisters Also developed blisters   Penicillin G Rash   Tramadol Rash    ROS Review of Systems  Constitutional:  Positive for fatigue.  Eyes: Negative.   Respiratory:  Positive for cough. Negative for shortness of breath.   Cardiovascular: Negative.   Gastrointestinal: Negative.   Endocrine: Negative.   Musculoskeletal:  Positive for arthralgias (chronic, osteoarthritis).  Skin: Negative.   Allergic/Immunologic: Negative.   Hematological: Negative.   Psychiatric/Behavioral: Negative.       Objective:    Physical Exam Vitals reviewed.  Constitutional:  Appearance: Normal appearance.  HENT:     Head: Normocephalic.     Right Ear: Tympanic membrane normal.     Left Ear:  Tympanic membrane normal.     Mouth/Throat:     Mouth: Mucous membranes are dry.  Cardiovascular:     Rate and Rhythm: Normal rate and regular rhythm.     Pulses: Normal pulses.     Heart sounds: Normal heart sounds.  Pulmonary:     Effort: Pulmonary effort is normal.     Comments: Diminished lung sounds in posterior lower lobes Abdominal:     General: Bowel sounds are normal.     Palpations: Abdomen is soft.  Skin:    General: Skin is warm and dry.     Capillary Refill: Capillary refill takes less than 2 seconds.  Neurological:     General: No focal deficit present.     Mental Status: He is alert and oriented to person, place, and time.  Psychiatric:        Mood and Affect: Mood normal.        Behavior: Behavior normal.    BP (!) 102/58    Pulse 71    Temp 98.8 F (37.1 C)    Resp 20    Ht 5\' 10"  (1.778 m)    Wt 194 lb 3.2 oz (88.1 kg)    SpO2 98%    BMI 27.86 kg/m   Wt Readings from Last 3 Encounters:  09/15/21 200 lb (90.7 kg)  09/08/21 201 lb (91.2 kg)  09/01/21 200 lb (90.7 kg)     Health Maintenance Due  Topic Date Due   COVID-19 Vaccine (1) 04/17/1952   FOOT EXAM  Never done   OPHTHALMOLOGY EXAM  Never done   Hepatitis C Screening  Never done   Zoster Vaccines- Shingrix (1 of 2) Never done   TETANUS/TDAP  11/13/2006   COLONOSCOPY (Pts 45-55yrs Insurance coverage will need to be confirmed)  10/27/2007   Pneumonia Vaccine 24+ Years old (4 - PPSV23 if available, else PCV20) 09/30/2016   HEMOGLOBIN A1C  06/15/2021    Lab Results  Component Value Date   TSH 2.630 12/16/2020   Lab Results  Component Value Date   WBC 8.4 08/25/2021   HGB 11.4 (A) 08/25/2021   HCT 36 (A) 08/25/2021   MCV 75 (A) 08/25/2021   PLT 237 08/25/2021   Lab Results  Component Value Date   NA 139 08/25/2021   K 3.9 08/25/2021   CO2 30 (A) 08/25/2021   GLUCOSE 143 (H) 12/16/2020   BUN 11 08/25/2021   CREATININE 0.8 08/25/2021   BILITOT 0.3 12/16/2020   ALKPHOS 98 08/25/2021    AST 20 08/25/2021   ALT 12 08/25/2021   PROT 6.8 12/16/2020   ALBUMIN 3.9 08/25/2021   CALCIUM 9.0 08/25/2021   ANIONGAP 7 01/10/2017   Lab Results  Component Value Date   CHOL 129 12/16/2020   Lab Results  Component Value Date   HDL 26 (L) 12/16/2020   Lab Results  Component Value Date   LDLCALC 55 12/16/2020   Lab Results  Component Value Date   TRIG 311 (H) 12/16/2020   Lab Results  Component Value Date   CHOLHDL 5.0 12/16/2020   Lab Results  Component Value Date   HGBA1C 6.8 (H) 12/16/2020      Assessment & Plan:    1. Aspiration pneumonia of right lower lobe, unspecified aspiration pneumonia type ([place]) - CBC with Differential/Platelet - Comprehensive metabolic  panel - DG Chest 2 View; Future  2. Chronic respiratory failure with hypoxia (HCC) - CBC with Differential/Platelet - Comprehensive metabolic panel - DG Chest 2 View; Future -continue O2 as needed   Rest and push fluids Continue O2 PRN  Follow-up as needed    Follow-up: PRN   I, Rip Harbour, NP, have reviewed all documentation for this visit. The documentation on 11/03/21 for the exam, diagnosis, procedures, and orders are all accurate and complete.   Signed, Rip Harbour, NP

## 2021-11-03 ENCOUNTER — Inpatient Hospital Stay: Payer: Medicare Other | Admitting: Family Medicine

## 2021-11-03 LAB — COMPREHENSIVE METABOLIC PANEL
ALT: 16 IU/L (ref 0–44)
AST: 18 IU/L (ref 0–40)
Albumin/Globulin Ratio: 1.5 (ref 1.2–2.2)
Albumin: 3.9 g/dL (ref 3.7–4.7)
Alkaline Phosphatase: 98 IU/L (ref 44–121)
BUN/Creatinine Ratio: 11 (ref 10–24)
BUN: 11 mg/dL (ref 8–27)
Bilirubin Total: 0.2 mg/dL (ref 0.0–1.2)
CO2: 29 mmol/L (ref 20–29)
Calcium: 9.1 mg/dL (ref 8.6–10.2)
Chloride: 100 mmol/L (ref 96–106)
Creatinine, Ser: 0.98 mg/dL (ref 0.76–1.27)
Globulin, Total: 2.6 g/dL (ref 1.5–4.5)
Glucose: 166 mg/dL — ABNORMAL HIGH (ref 70–99)
Potassium: 4.8 mmol/L (ref 3.5–5.2)
Sodium: 141 mmol/L (ref 134–144)
Total Protein: 6.5 g/dL (ref 6.0–8.5)
eGFR: 82 mL/min/{1.73_m2} (ref >59)

## 2021-11-03 LAB — CBC WITH DIFFERENTIAL/PLATELET
Basophils Absolute: 0.1 10*3/uL (ref 0.0–0.2)
Basos: 1 %
EOS (ABSOLUTE): 0.3 10*3/uL (ref 0.0–0.4)
Eos: 3 %
Hematocrit: 39.3 % (ref 37.5–51.0)
Hemoglobin: 12.9 g/dL — ABNORMAL LOW (ref 13.0–17.7)
Immature Grans (Abs): 0.1 10*3/uL (ref 0.0–0.1)
Immature Granulocytes: 1 %
Lymphocytes Absolute: 2.7 10*3/uL (ref 0.7–3.1)
Lymphs: 27 %
MCH: 26.7 pg (ref 26.6–33.0)
MCHC: 32.8 g/dL (ref 31.5–35.7)
MCV: 81 fL (ref 79–97)
Monocytes Absolute: 0.6 10*3/uL (ref 0.1–0.9)
Monocytes: 6 %
Neutrophils Absolute: 6.3 10*3/uL (ref 1.4–7.0)
Neutrophils: 62 %
Platelets: 332 10*3/uL (ref 150–450)
RBC: 4.83 x10E6/uL (ref 4.14–5.80)
RDW: 17.5 % — ABNORMAL HIGH (ref 11.6–15.4)
WBC: 10 10*3/uL (ref 3.4–10.8)

## 2021-11-03 NOTE — Progress Notes (Incomplete)
Don [name]  [address] [place],  [place]  [address] [phone]  Clinic Day:  11/03/2021  Referring physician: [name], MD  This document serves as a record of services personally performed by Hosie Poisson, MD. It was created on their behalf by Curry,Lauren E, a trained medical scribe. The creation of this record is based on the scribe's personal observations and the provider's statements to them.  CHIEF COMPLAINT:  CC: Leukocytosis  Current Treatment:  Surveillance   HISTORY OF PRESENT ILLNESS:  Don [name] is a 70 y.o. male with a history of leukocytosis.  He is aware of the white count being elevated for several months but he also has had 3 episodes of pneumonia and COPD.  On March 22nd, his white count was 14.6 with 66% neutrophils, 26% lymphocytes, 4% monocytes, and 2% eosinophils.  He has elevation of both the ANC and the absolute lymphocyte count.  Hemoglobin was 14.3 with an MCV of 90 and platelet count of a 190,000.  It was noted in the record that he has gradually had an increasing white blood count over the last 6 months.  I reviewed the hospital records and his white count was normal in 2017. On December 25th his white count was up to 18.4 and by February 5th it was 14.0.  He has not had corticosteroids since December.  However he does have cough productive of white sputum which is occasionally brown.  He denies any fevers or chills, but has had night sweats for a long time.  He is still on antibiotics now and notes that his dyspnea is worse, but denies hemoptysis or wheezing.  He does continue to smoke.  He has reported pulmonary nodules and so I reviewed his serial CT scans.  He tells me this was first found in [place] in 2002 but he denies living near the [place].  In April of 2017 he had multiple nodular opacities in the right lung up to 10 mm and follow-up was recommended.  He tells me he did have a PET scan many  years ago which was negative.  A repeat CT of the chest in December of 2018 revealed stable right pulmonary lung nodules which remain similar and up to 10 mm.  He had repeat CT of the chest in April of 2019, which once again shows multiple nodules with occasional new ones and occasional ones that appears slightly larger.  It was recommended that this be repeated in 3 months.  I reviewed these images with the patient and his wife at the time of my consultation in April of 2019. At that time he had a mild leukocytosis which was stable and had a normal differential, so I did not pursue further investigation.  He does have a history of a melanoma of the back in 2016 treated with wide excision.  He also has had multiple superficial cancers of the larynx treated with laser by the ENT surgeon.  He was seen again in June 2021, and was found to have low B12 and iron levels. B12 level was low at 199 in 2021 with a normal folate level.  His iron studies were consistent with iron deficiency, with a ferritin of 9.0, iron level of 42 with a TIBC of 408 for a saturation of 10.2. He was advised to start B12 injections and oral iron.    INTERVAL HISTORY:  Don [name] 70 here for annual follow up and states that he has been having  flare ups of his COPD. He has required 4 courses of antibiotics and 4 courses of steroids. He does have oxygen at home. He is back on B12 injections once weekly for the past 8 months. He continues oral iron supplement daily. He denies melena, epistaxis, rectal bleeding, hematuria or other forms of overt bleeding. He denies pica to ice or soreness of the tongue. He does report fatigue and extremity weakness. His legs will "give out" which causes falls and occasionally he will drop things. He also has had headaches.  We reviewed previous MRI imaging of the cervical and lumbar spine from 2021 which shows significant degenerative changes including spinal stenosis. He will have intermittent abdominal  pain/tenderness. He underwent EGD in April 2022 with Dr. Melina Copa which revealed a normal exam. Hemoglobin has decreased from 13.2 to 11.4 with an MCV of 75, and white count and platelets are normal. We will repeat iron studies, B12 and folate today. Chemistries are unremarkable. His  appetite is good, and he has lost 6 pounds since his last visit.  He denies fever, chills or other signs of infection.  He denies nausea, vomiting or bowel issues.  He denies sore throat, cough or chest pain.  Chestly is here for routine follow up ***.   His  appetite is good, and he has gained/lost _ pounds since his last visit.  He denies fever, chills or other signs of infection.  He denies nausea, vomiting, bowel issues, or abdominal pain.  He denies sore throat, cough, dyspnea, or chest pain.  REVIEW OF SYSTEMS:  Review of Systems  Constitutional: Negative.  Negative for appetite change, chills, fatigue, fever and unexpected weight change.  HENT:  Negative.    Eyes: Negative.   Respiratory: Negative.  Negative for chest tightness, cough, hemoptysis, shortness of breath and wheezing.   Cardiovascular: Negative.  Negative for chest pain, leg swelling and palpitations.  Gastrointestinal: Negative.  Negative for abdominal distention, abdominal pain, blood in stool, constipation, diarrhea, nausea and vomiting.  Endocrine: Negative.   Genitourinary: Negative.  Negative for difficulty urinating, dysuria, frequency and hematuria.   Musculoskeletal: Negative.  Negative for arthralgias, back pain, flank pain, gait problem and myalgias.  Skin: Negative.   Neurological: Negative.  Negative for dizziness, extremity weakness, gait problem, headaches, light-headedness, numbness, seizures and speech difficulty.  Hematological: Negative.   Psychiatric/Behavioral: Negative.  Negative for depression and sleep disturbance. The patient is not nervous/anxious.   All other systems reviewed and are negative.   VITALS:  There were no  vitals taken for this visit.  Wt Readings from Last 3 Encounters:  11/02/21 194 lb 3.2 oz (88.1 kg)  09/15/21 200 lb (90.7 kg)  09/08/21 201 lb (91.2 kg)    There is no height or weight on file to calculate BMI.  Performance status (ECOG): 2 - Symptomatic, <50% confined to bed  PHYSICAL EXAM:  Physical Exam Constitutional:      General: He is not in acute distress.    Appearance: Normal appearance. He is normal weight.  HENT:     Head: Normocephalic and atraumatic.  Eyes:     General: No scleral icterus.    Extraocular Movements: Extraocular movements intact.     Conjunctiva/sclera: Conjunctivae normal.     Pupils: Pupils are equal, round, and reactive to light.  Cardiovascular:     Rate and Rhythm: Normal rate and regular rhythm.     Pulses: Normal pulses.     Heart sounds: Normal heart sounds. No murmur heard.  No friction rub. No gallop.  Pulmonary:     Effort: Pulmonary effort is normal. No respiratory distress.     Breath sounds: Normal breath sounds.  Abdominal:     General: Bowel sounds are normal. There is no distension.     Palpations: Abdomen is soft. There is no hepatomegaly, splenomegaly or mass.     Tenderness: There is no abdominal tenderness.  Musculoskeletal:        General: Normal range of motion.     Cervical back: Normal range of motion and neck supple.     Right lower leg: No edema.     Left lower leg: No edema.  Lymphadenopathy:     Cervical: No cervical adenopathy.  Skin:    General: Skin is warm and dry.  Neurological:     General: No focal deficit present.     Mental Status: He is alert and oriented to person, place, and time. Mental status is at baseline.  Psychiatric:        Mood and Affect: Mood normal.        Behavior: Behavior normal.        Thought Content: Thought content normal.        Judgment: Judgment normal.    LABS:   CBC Latest Ref Rng & Units 11/02/2021 08/25/2021 12/16/2020  WBC 3.4 - 10.8 x10E3/uL 10.0 8.4 9.7   Hemoglobin 13.0 - 17.7 g/dL 12.9(L) 11.4(A) 13.8  Hematocrit 37.5 - 51.0 % 39.3 36(A) 41.4  Platelets 150 - 450 x10E3/uL 332 237 230   CMP Latest Ref Rng & Units 11/02/2021 08/25/2021 12/16/2020  Glucose 70 - 99 mg/dL 166(H) - 143(H)  BUN 8 - 27 mg/dL 11 11 10   Creatinine 0.76 - 1.27 mg/dL 0.98 0.8 0.87  Sodium 134 - 144 mmol/L 141 139 141  Potassium 3.5 - 5.2 mmol/L 4.8 3.9 4.5  Chloride 96 - 106 mmol/L 100 102 100  CO2 20 - 29 mmol/L 29 30(A) 30(H)  Calcium 8.6 - 10.2 mg/dL 9.1 9.0 9.1  Total Protein 6.0 - 8.5 g/dL 6.5 - 6.8  Total Bilirubin 0.0 - 1.2 mg/dL 0.2 - 0.3  Alkaline Phos 44 - 121 IU/L 98 98 125(H)  AST 0 - 40 IU/L 18 20 19   ALT 0 - 44 IU/L 16 12 17     Lab Results  Component Value Date   ALBUMINELP 3.2 03/18/2020   MSPIKE Not Observed 03/18/2020   Lab Results  Component Value Date   TIBC 446 08/25/2021   FERRITIN 7 (L) 08/25/2021   IRONPCTSAT 6 (L) 08/25/2021   No results found for: LDH  STUDIES:  No results found.    HISTORY:   Allergies:  Allergies  Allergen Reactions   Penicillins Rash and Hives    Has patient had a PCN reaction causing immediate rash, facial/tongue/throat swelling, SOB or lightheadedness with hypotension:unsure Has patient had a PCN reaction causing severe rash involving mucus membranes or skin necrosis:unsure Has patient had a PCN reaction that required hospitalization:No Has patient had a PCN reaction occurring within the last 10 years:No If all of the above answers are "NO", then may proceed with Cephalosporin use.  rash   Prednisone     Depressed mood - can tolerate it, but it makes him very irritable   Doxycycline Rash   Iodinated Diagnostic Agents Rash    Also developed blisters Also developed blisters   Penicillin G Rash   Tramadol Rash    Current Medications: Current Outpatient Medications  Medication Sig  Dispense Refill   ALPRAZolam (XANAX) 0.25 MG tablet TAKE 1 TABLET BY MOUTH ONCE DAILY AS NEEDED FOR ANXIETY 30  tablet 0   cyclobenzaprine (FLEXERIL) 10 MG tablet TAKE 1 TABLET BY MOUTH THREE TIMES DAILY AS NEEDED FOR  BACK  SPASM 90 tablet 0   amLODipine (NORVASC) 5 MG tablet TAKE 1 TABLET BY MOUTH ONCE DAILY . APPOINTMENT REQUIRED FOR FUTURE REFILLS 30 tablet 0   aspirin 81 MG tablet Take 162 mg by mouth daily.      BREO ELLIPTA 100-25 MCG/INH AEPB INHALE 1 PUFF BY MOUTH ONCE DAILY 60 each 2   clopidogrel (PLAVIX) 75 MG tablet Take 1 tablet by mouth once daily 90 tablet 2   DULoxetine (CYMBALTA) 60 MG capsule Take 1 capsule by mouth twice daily 180 capsule 1   finasteride (PROSCAR) 5 MG tablet Take 1 tablet by mouth once daily 90 tablet 0   gabapentin (NEURONTIN) 400 MG capsule TAKE 1 CAPSULE BY MOUTH THREE TIMES DAILY 90 capsule 0   HYDROcodone-acetaminophen (NORCO/VICODIN) 5-325 MG tablet Take 1 tablet by mouth every 8 (eight) hours as needed for moderate pain. 90 tablet 0   ipratropium-albuterol (DUONEB) 0.5-2.5 (3) MG/3ML SOLN Take 3 mLs by nebulization every 6 (six) hours as needed (for shortness of breath/wheezing. (with bronchitis)).     metFORMIN (GLUCOPHAGE) 500 MG tablet Take 1 tablet by mouth twice daily 180 tablet 0   metoprolol tartrate (LOPRESSOR) 50 MG tablet Take 1 tablet by mouth twice daily 180 tablet 0   nirmatrelvir/ritonavir EUA (PAXLOVID) 20 x 150 MG & 10 x 100MG  TABS (Take nirmatrelvir 150 mg two tablets twice daily for 5 days and ritonavir 100 mg one tablet twice daily for 5 days) Patient GFR is 90 30 tablet 0   nitroGLYCERIN (NITROSTAT) 0.4 MG SL tablet Place 0.4 mg under the tongue every 5 (five) minutes as needed for chest pain.     nystatin cream (MYCOSTATIN) Apply 1 application topically 2 (two) times daily as needed for dry skin.     Omega-3 Fatty Acids (FISH OIL) 1000 MG CAPS Take 2 capsules (2,000 mg total) by mouth in the morning and at bedtime. 180 capsule 12   omeprazole (PRILOSEC) 20 MG capsule Take 1 capsule by mouth once daily 90 capsule 0   rosuvastatin (CRESTOR) 40  MG tablet Take 1 tablet by mouth once daily 90 tablet 0   SPIRIVA RESPIMAT 2.5 MCG/ACT AERS Inhale 2 puffs into the lungs daily. 4 g 2   tamsulosin (FLOMAX) 0.4 MG CAPS capsule TAKE 2 CAPSULES BY MOUTH ONCE DAILY AFTER SUPPER 180 capsule 0   TOUJEO SOLOSTAR 300 UNIT/ML Solostar Pen INJECT 30 UNITS SUBCUTANEOUSLY ONCE DAILY 6 mL 2   traZODone (DESYREL) 150 MG tablet Take 1 tablet by mouth once daily 90 tablet 1   VENTOLIN HFA 108 (90 Base) MCG/ACT inhaler Inhale 2 puffs into the lungs as needed for shortness of breath or wheezing.  0   No current facility-administered medications for this visit.     ASSESSMENT & PLAN:   Assessment:   1. History of melanoma of the back 5 years ago with no evidence of disease.  2. Multiple cancers of the larynx which have been superficial and treated with laser by the ENT surgeon.  Most recently back in 2018.    3. Multiple pulmonary nodules which have been present for years and are likely benign.  The largest is less than 10 mm.    4. Tobacco abuse.  He has  advanced emphysema.  5.  Severe generalized bone pain of the upper back, neck, and right hip. He also complains of extremity weakness. Rheumatoid factor was negative and M-spike was negative for monoclonal spike and quantitative immunoglobulins are normal. Imaging has shown significant degenerative changes including spinal stenosis.  6.  Anemia, symptomatic. He continued oral iron supplement daily for a year and a half and is on weekly B12 injections. However, iron studies from October were consistent with deficiency. He therefore received IV Venofer in November.  B12 was also low at 146.   7.  Epigastric tenderness. I advised that he follow up with Dr. Melina Copa again.  8.  Significant shortness of breath and emphysema. He has require 4 course of antibiotics and steroids recently. His anemia is likely contributing to this as well.  Plan: He comes in today with multiple complaints and these were all  reviewed and addressed. His intermittent extremity weakness is likely secondary to significant degenerative changes as seen on MRI imaging in 2021. I reviewed the results with them today. Iron studies, B12 and folate will be ordered for further evaluation of his worsening anemia. He is currently on oral supplement, but if require, we can arrange for IV iron supplement.  He had an EGD in April 2022 with Dr. Melina Copa, but with his anemia and epigastric tenderness, I recommend he follow up with him again. I recommend GI evaluation with repeat EGD and colonoscopy. We will bring him back in 1-2 months with CBC and CMP for repeat evaluation.  I do advise that we follow up with him at least annually.  He understands and agrees to this plan of care.     I provided 30 minutes of face-to-face time during this this encounter and > 50% was spent counseling as documented under my assessment and plan.    Derwood Kaplan, MD [facility] [address] [place] [address] Dept: [phone] Dept Fax: [phone]   I, Rita Ohara, am acting as scribe for Derwood Kaplan, MD  I have reviewed this report as typed by the medical scribe, and it is complete and accurate.  Hermina Barters

## 2021-11-04 DIAGNOSIS — J9610 Chronic respiratory failure, unspecified whether with hypoxia or hypercapnia: Secondary | ICD-10-CM | POA: Diagnosis not present

## 2021-11-04 DIAGNOSIS — J449 Chronic obstructive pulmonary disease, unspecified: Secondary | ICD-10-CM | POA: Diagnosis not present

## 2021-11-08 ENCOUNTER — Other Ambulatory Visit: Payer: Self-pay | Admitting: Family Medicine

## 2021-11-09 ENCOUNTER — Telehealth: Payer: Self-pay | Admitting: Oncology

## 2021-11-09 NOTE — Telephone Encounter (Signed)
11/09/21 wife called and cancelled appts.She will call back to reschedule

## 2021-11-10 ENCOUNTER — Other Ambulatory Visit: Payer: Medicare Other

## 2021-11-10 ENCOUNTER — Ambulatory Visit: Payer: Medicare Other | Admitting: Oncology

## 2021-11-22 ENCOUNTER — Encounter: Payer: Self-pay | Admitting: Family Medicine

## 2021-12-06 ENCOUNTER — Other Ambulatory Visit: Payer: Self-pay | Admitting: Family Medicine

## 2021-12-09 NOTE — Progress Notes (Signed)
Don [name]  [address] [place],  [place]  [address] [phone]  Clinic Day:  12/15/2021  Referring physician: [name], MD  This document serves as a record of services personally performed by Don Poisson, MD. It was created on their behalf by Curry,Lauren E, a trained medical scribe. The creation of this record is based on the scribe's personal observations and the provider's statements to them.  CHIEF COMPLAINT:  CC: Leukocytosis  Current Treatment:  Surveillance   HISTORY OF PRESENT ILLNESS:  Don [name] is a 71 y.o. male with a history of leukocytosis.  He is aware of the white count being elevated for several months but he also has had 3 episodes of pneumonia and COPD.  On March 22nd, his white count was 14.6 with 66% neutrophils, 26% lymphocytes, 4% monocytes, and 2% eosinophils.  He has elevation of both the ANC and the absolute lymphocyte count.  Hemoglobin was 14.3 with an MCV of 90 and platelet count of a 190,000.  It was noted in the record that he has gradually had an increasing white blood count over the last 6 months.  I reviewed the hospital records and his white count was normal in 2017. On December 25th his white count was up to 18.4 and by February 5th it was 14.0.  He has not had corticosteroids since December.  However he does have cough productive of white sputum which is occasionally brown.  He denies any fevers or chills, but has had night sweats for a long time.  He is still on antibiotics now and notes that his dyspnea is worse, but denies hemoptysis or wheezing.  He does continue to smoke.  He has reported pulmonary nodules and so I reviewed his serial CT scans.  He tells me this was first found in [place] in 2002 but he denies living near the [place].  In April of 2017 he had multiple nodular opacities in the right lung up to 10 mm and follow-up was recommended.  He tells me he did have a PET scan many  years ago which was negative.  A repeat CT of the chest in December of 2018 revealed stable right pulmonary lung nodules which remain similar and up to 10 mm.  He had repeat CT of the chest in April of 2019, which once again shows multiple nodules with occasional new ones and occasional ones that appears slightly larger.  It was recommended that this be repeated in 3 months.  I reviewed these images with the patient and his wife at the time of my consultation in April of 2019. At that time he had a mild leukocytosis which was stable and had a normal differential, so I did not pursue further investigation.  He does have a history of a melanoma of the back in 2016 treated with wide excision.  He also has had multiple superficial cancers of the larynx treated with laser by the ENT surgeon.  He was seen again in June 2021, and was found to have low B12 and iron levels. B12 level was low at 199 in 2021 with a normal folate level.  His iron studies were consistent with iron deficiency, with a ferritin of 9.0, iron level of 42 with a TIBC of 408 for a saturation of 10.2. He was advised to start B12 injections and oral iron. MRI imaging of the cervical and lumbar spine from 2021 which shows significant degenerative changes including spinal stenosis. He underwent  EGD in April 2022 with Dr. Melina Copa which revealed a normal exam.   INTERVAL HISTORY:  Don [name] is here for routine follow up. He was found to be iron deficient in October with a saturation of 6%, so he received IV iron at that time. B12 was also low at 146. He was hospitalized after Thanksgiving with aspiration pneumonia which he related to the colonoscopy and EGD with Dr. Melina Copa. His B12 level was rechecked and he was told he could stop B12 supplement. He complains of abdominal pain which comes and goes. He had a fall last week. His hemoglobin came up from 11.4 to 12.9, and currently it is even better at 13.4. Other labs were unremarkable except for a blood  sugar of 267. His  appetite is good, and he has lost 2 pounds since his last visit.  He denies fever, chills or other signs of infection.  He denies nausea, vomiting, bowel issues, or abdominal pain.  He denies sore throat, cough, dyspnea, or chest pain.  REVIEW OF SYSTEMS:  Review of Systems  Constitutional: Negative.  Negative for appetite change, chills, fatigue, fever and unexpected weight change.  HENT:  Negative.    Eyes: Negative.   Respiratory: Negative.  Negative for chest tightness, cough, hemoptysis, shortness of breath and wheezing.   Cardiovascular: Negative.  Negative for chest pain, leg swelling and palpitations.  Gastrointestinal:  Positive for abdominal pain (occasional). Negative for abdominal distention, blood in stool, constipation, diarrhea, nausea and vomiting.  Endocrine: Negative.   Genitourinary: Negative.  Negative for difficulty urinating, dysuria, frequency and hematuria.   Musculoskeletal: Negative.  Negative for arthralgias, back pain, flank pain, gait problem and myalgias.  Skin: Negative.   Neurological: Negative.  Negative for dizziness, extremity weakness, gait problem, headaches, light-headedness, numbness, seizures and speech difficulty.  Hematological: Negative.   Psychiatric/Behavioral: Negative.  Negative for depression and sleep disturbance. The patient is not nervous/anxious.   All other systems reviewed and are negative.   VITALS:  Blood pressure 136/73, pulse 75, temperature 97.7 F (36.5 C), temperature source Oral, resp. rate 20, height 5\' 10"  (1.778 m), weight 199 lb 4.8 oz (90.4 kg), SpO2 95 %.  Wt Readings from Last 3 Encounters:  12/15/21 199 lb 4.8 oz (90.4 kg)  11/02/21 194 lb 3.2 oz (88.1 kg)  09/15/21 200 lb (90.7 kg)    Body mass index is 28.6 kg/m.  Performance status (ECOG): 2 - Symptomatic, <50% confined to bed  PHYSICAL EXAM:  Physical Exam Constitutional:      General: He is not in acute distress.    Appearance: Normal  appearance. He is normal weight.  HENT:     Head: Normocephalic and atraumatic.  Eyes:     General: No scleral icterus.    Extraocular Movements: Extraocular movements intact.     Conjunctiva/sclera: Conjunctivae normal.     Pupils: Pupils are equal, round, and reactive to light.  Cardiovascular:     Rate and Rhythm: Normal rate and regular rhythm.     Pulses: Normal pulses.     Heart sounds: Normal heart sounds. No murmur heard.   No friction rub. No gallop.  Pulmonary:     Effort: Pulmonary effort is normal. No respiratory distress.     Breath sounds: Normal breath sounds.  Abdominal:     General: Bowel sounds are normal. There is no distension.     Palpations: Abdomen is soft. There is no hepatomegaly, splenomegaly or mass.     Tenderness: There is abdominal tenderness (  diffuse).  Musculoskeletal:        General: Normal range of motion.     Cervical back: Normal range of motion and neck supple.     Right lower leg: No edema.     Left lower leg: No edema.  Lymphadenopathy:     Cervical: No cervical adenopathy.  Skin:    General: Skin is warm and dry.  Neurological:     General: No focal deficit present.     Mental Status: He is alert and oriented to person, place, and time. Mental status is at baseline.  Psychiatric:        Mood and Affect: Mood normal.        Behavior: Behavior normal.        Thought Content: Thought content normal.        Judgment: Judgment normal.    LABS:   CBC Latest Ref Rng & Units 12/15/2021 11/02/2021 08/25/2021  WBC - 7.4 10.0 8.4  Hemoglobin 13.5 - 17.5 13.4(A) 12.9(L) 11.4(A)  Hematocrit 41 - 53 40(A) 39.3 36(A)  Platelets 150 - 399 206 332 237   CMP Latest Ref Rng & Units 12/15/2021 11/02/2021 08/25/2021  Glucose 70 - 99 mg/dL - 166(H) -  BUN 4 - 21 12 11 11   Creatinine 0.6 - 1.3 0.8 0.98 0.8  Sodium 137 - 147 138 141 139  Potassium 3.4 - 5.3 4.1 4.8 3.9  Chloride 99 - 108 101 100 102  CO2 13 - 22 30(A) 29 30(A)  Calcium 8.7 - 10.7  8.6(A) 9.1 9.0  Total Protein 6.0 - 8.5 g/dL - 6.5 -  Total Bilirubin 0.0 - 1.2 mg/dL - 0.2 -  Alkaline Phos 25 - 125 85 98 98  AST 14 - 40 21 18 20   ALT 10 - 40 17 16 12     Lab Results  Component Value Date   ALBUMINELP 3.2 03/18/2020   MSPIKE Not Observed 03/18/2020   Lab Results  Component Value Date   TIBC 446 08/25/2021   FERRITIN 7 (L) 08/25/2021   IRONPCTSAT 6 (L) 08/25/2021   No results found for: LDH  STUDIES:  No results found.    HISTORY:   Allergies:  Allergies  Allergen Reactions   Penicillins Rash and Hives    Has patient had a PCN reaction causing immediate rash, facial/tongue/throat swelling, SOB or lightheadedness with hypotension:unsure Has patient had a PCN reaction causing severe rash involving mucus membranes or skin necrosis:unsure Has patient had a PCN reaction that required hospitalization:No Has patient had a PCN reaction occurring within the last 10 years:No If all of the above answers are "NO", then may proceed with Cephalosporin use.  rash   Prednisone     Depressed mood - can tolerate it, but it makes him very irritable   Doxycycline Rash   Iodinated Contrast Media Rash    Also developed blisters Also developed blisters   Penicillin G Rash   Tramadol Rash    Current Medications: Current Outpatient Medications  Medication Sig Dispense Refill   ALPRAZolam (XANAX) 0.25 MG tablet TAKE 1 TABLET BY MOUTH ONCE DAILY AS NEEDED FOR ANXIETY 30 tablet 2   amLODipine (NORVASC) 5 MG tablet TAKE 1 TABLET BY MOUTH ONCE DAILY . APPOINTMENT REQUIRED FOR FUTURE REFILLS 30 tablet 0   aspirin 81 MG tablet Take 162 mg by mouth daily.      BREO ELLIPTA 100-25 MCG/INH AEPB INHALE 1 PUFF BY MOUTH ONCE DAILY 60 each 2   clopidogrel (PLAVIX) 75  MG tablet Take 1 tablet by mouth once daily 90 tablet 2   cyclobenzaprine (FLEXERIL) 10 MG tablet TAKE 1 TABLET BY MOUTH THREE TIMES DAILY AS NEEDED FOR  BACK  SPASMS 90 tablet 0   DULoxetine (CYMBALTA) 60 MG capsule  Take 1 capsule by mouth twice daily 180 capsule 1   finasteride (PROSCAR) 5 MG tablet Take 1 tablet by mouth once daily 90 tablet 0   gabapentin (NEURONTIN) 400 MG capsule TAKE 1 CAPSULE BY MOUTH THREE TIMES DAILY 90 capsule 2   HYDROcodone-acetaminophen (NORCO/VICODIN) 5-325 MG tablet Take 1 tablet by mouth every 8 (eight) hours as needed for moderate pain. 90 tablet 0   ipratropium-albuterol (DUONEB) 0.5-2.5 (3) MG/3ML SOLN Take 3 mLs by nebulization every 6 (six) hours as needed (for shortness of breath/wheezing. (with bronchitis)).     metFORMIN (GLUCOPHAGE) 500 MG tablet Take 1 tablet by mouth twice daily 180 tablet 0   metoprolol tartrate (LOPRESSOR) 50 MG tablet Take 1 tablet by mouth twice daily 180 tablet 0   nirmatrelvir/ritonavir EUA (PAXLOVID) 20 x 150 MG & 10 x 100MG  TABS (Take nirmatrelvir 150 mg two tablets twice daily for 5 days and ritonavir 100 mg one tablet twice daily for 5 days) Patient GFR is 90 30 tablet 0   nitroGLYCERIN (NITROSTAT) 0.4 MG SL tablet Place 0.4 mg under the tongue every 5 (five) minutes as needed for chest pain.     nystatin cream (MYCOSTATIN) Apply 1 application topically 2 (two) times daily as needed for dry skin.     Omega-3 Fatty Acids (FISH OIL) 1000 MG CAPS Take 2 capsules (2,000 mg total) by mouth in the morning and at bedtime. 180 capsule 12   omeprazole (PRILOSEC) 20 MG capsule Take 1 capsule by mouth once daily 90 capsule 0   rosuvastatin (CRESTOR) 40 MG tablet Take 1 tablet by mouth once daily 90 tablet 0   SPIRIVA RESPIMAT 2.5 MCG/ACT AERS Inhale 2 puffs into the lungs daily. 4 g 2   tamsulosin (FLOMAX) 0.4 MG CAPS capsule TAKE 2 CAPSULES BY MOUTH ONCE DAILY AFTER SUPPER 180 capsule 0   TOUJEO SOLOSTAR 300 UNIT/ML Solostar Pen INJECT 30 UNITS SUBCUTANEOUSLY ONCE DAILY 6 mL 0   traZODone (DESYREL) 150 MG tablet Take 1 tablet by mouth once daily 90 tablet 1   VENTOLIN HFA 108 (90 Base) MCG/ACT inhaler Inhale 2 puffs into the lungs as needed for  shortness of breath or wheezing.  0   No current facility-administered medications for this visit.     ASSESSMENT & PLAN:   Assessment:   1. History of melanoma of the back 5 years ago with no evidence of disease.  2. Multiple cancers of the larynx which have been superficial and treated with laser by the ENT surgeon.  Most recently back in 2018.    3. Multiple pulmonary nodules which have been present for years and are likely benign.  The largest is less than 10 mm.    4. Tobacco abuse.  He has advanced emphysema.  5.  Severe generalized bone pain of the upper back, neck, and right hip. He also complains of extremity weakness. Rheumatoid factor was negative and M-spike was negative for monoclonal spike and quantitative immunoglobulins are normal. Imaging has shown significant degenerative changes including spinal stenosis.  6.  Anemia, symptomatic. He did not respond to oral iron supplement and so was given IV iron in October 2022. Colonoscopy with Dr. Melina Copa from December 2022 was negative for hemorrhage.  7.  Diffuse abdominal tenderness which is nonspecific. He can follow up with his primary care physician if this persists.  8.  Prior B12 deficiency.  The injections have been stopped as his last level was okay.  Plan: We will bring him back in 4-6 months with CBC, CMP, iron studies and B12 for repeat evaluation.  I do advise that we follow up with him at least annually.  He understands and agrees to this plan of care.     I provided 20 minutes of face-to-face time during this this encounter and > 50% was spent counseling as documented under my assessment and plan.    Derwood Kaplan, MD [facility] [address] [place] [address] Dept: [phone] Dept Fax: [phone]   I, Rita Ohara, am acting as scribe for Derwood Kaplan, MD  I have reviewed this report as typed by the medical scribe,  and it is complete and accurate.  Hermina Barters

## 2021-12-11 ENCOUNTER — Other Ambulatory Visit: Payer: Self-pay | Admitting: Family Medicine

## 2021-12-11 ENCOUNTER — Other Ambulatory Visit: Payer: Self-pay | Admitting: Physician Assistant

## 2021-12-12 ENCOUNTER — Other Ambulatory Visit: Payer: Self-pay

## 2021-12-12 ENCOUNTER — Other Ambulatory Visit: Payer: Medicare Other

## 2021-12-12 ENCOUNTER — Ambulatory Visit: Payer: Medicare Other | Admitting: Oncology

## 2021-12-15 ENCOUNTER — Other Ambulatory Visit: Payer: Self-pay

## 2021-12-15 ENCOUNTER — Other Ambulatory Visit: Payer: Self-pay | Admitting: Oncology

## 2021-12-15 ENCOUNTER — Inpatient Hospital Stay: Payer: Medicare Other | Attending: Oncology

## 2021-12-15 ENCOUNTER — Other Ambulatory Visit: Payer: Self-pay | Admitting: Hematology and Oncology

## 2021-12-15 ENCOUNTER — Inpatient Hospital Stay (INDEPENDENT_AMBULATORY_CARE_PROVIDER_SITE_OTHER): Payer: Medicare Other | Admitting: Oncology

## 2021-12-15 ENCOUNTER — Encounter: Payer: Self-pay | Admitting: Oncology

## 2021-12-15 VITALS — BP 136/73 | HR 75 | Temp 97.7°F | Resp 20 | Ht 70.0 in | Wt 199.3 lb

## 2021-12-15 DIAGNOSIS — D649 Anemia, unspecified: Secondary | ICD-10-CM | POA: Diagnosis not present

## 2021-12-15 DIAGNOSIS — C329 Malignant neoplasm of larynx, unspecified: Secondary | ICD-10-CM | POA: Diagnosis not present

## 2021-12-15 DIAGNOSIS — D539 Nutritional anemia, unspecified: Secondary | ICD-10-CM

## 2021-12-15 DIAGNOSIS — C4359 Malignant melanoma of other part of trunk: Secondary | ICD-10-CM

## 2021-12-15 LAB — HEPATIC FUNCTION PANEL
ALT: 17 (ref 10–40)
AST: 21 (ref 14–40)
Alkaline Phosphatase: 85 (ref 25–125)
Bilirubin, Total: 0.5

## 2021-12-15 LAB — CBC AND DIFFERENTIAL
HCT: 40 — AB (ref 41–53)
Hemoglobin: 13.4 — AB (ref 13.5–17.5)
Neutrophils Absolute: 4.37
Platelets: 206 (ref 150–399)
WBC: 7.4

## 2021-12-15 LAB — CBC: RBC: 4.85 (ref 3.87–5.11)

## 2021-12-15 LAB — BASIC METABOLIC PANEL
BUN: 12 (ref 4–21)
CO2: 30 — AB (ref 13–22)
Chloride: 101 (ref 99–108)
Creatinine: 0.8 (ref 0.6–1.3)
Glucose: 267
Potassium: 4.1 (ref 3.4–5.3)
Sodium: 138 (ref 137–147)

## 2021-12-15 LAB — COMPREHENSIVE METABOLIC PANEL
Albumin: 3.7 (ref 3.5–5.0)
Calcium: 8.6 — AB (ref 8.7–10.7)

## 2021-12-17 DIAGNOSIS — J9610 Chronic respiratory failure, unspecified whether with hypoxia or hypercapnia: Secondary | ICD-10-CM | POA: Diagnosis not present

## 2021-12-17 DIAGNOSIS — J449 Chronic obstructive pulmonary disease, unspecified: Secondary | ICD-10-CM | POA: Diagnosis not present

## 2021-12-25 ENCOUNTER — Encounter: Payer: Self-pay | Admitting: Oncology

## 2021-12-30 ENCOUNTER — Telehealth (INDEPENDENT_AMBULATORY_CARE_PROVIDER_SITE_OTHER): Payer: Medicare Other | Admitting: Legal Medicine

## 2021-12-30 ENCOUNTER — Encounter: Payer: Self-pay | Admitting: Legal Medicine

## 2021-12-30 VITALS — BP 149/73 | HR 76 | Temp 98.7°F | Ht 70.0 in | Wt 187.0 lb

## 2021-12-30 DIAGNOSIS — J441 Chronic obstructive pulmonary disease with (acute) exacerbation: Secondary | ICD-10-CM | POA: Diagnosis not present

## 2021-12-30 MED ORDER — PREDNISONE 10 MG (21) PO TBPK: ORAL_TABLET | ORAL | 0 refills | Status: DC

## 2021-12-30 MED ORDER — LEVOFLOXACIN 500 MG PO TABS: 500.0000 mg | ORAL_TABLET | Freq: Every day | ORAL | 0 refills | Status: DC

## 2021-12-30 NOTE — Progress Notes (Signed)
Virtual Visit via Video Note   This visit type was conducted due to national recommendations for restrictions regarding the COVID-19 Pandemic (e.g. social distancing) in an effort to limit this patient's exposure and mitigate transmission in our community.  Due to his co-morbid illnesses, this patient is at least at moderate risk for complications without adequate follow up.  This format is felt to be most appropriate for this patient at this time.  All issues noted in this document were discussed and addressed.  A limited physical exam was performed with this format.  A verbal consent was obtained for the virtual visit.   Date:  12/30/2021   ID:  Don [name], DOB 23-Feb-1951, MRN [id number]  Patient Location: Home Provider Location: Home Office  PCP:  [name], MD   Evaluation Performed:  Follow-Up Visit  Chief Complaint:  Cough  History of Present Illness:    Don [name] is a 71 y.o. male with cough, wheezing, shortness of breath for 2 days ago. He has COPD and he wants to prevent to have a pneumonia. Patient denied fever, chills, headache, chest pain, sore throat, ear pain.  The patient does not have symptoms concerning for COVID-19 infection (fever, chills, cough, or new shortness of breath).    Past Medical History:  Diagnosis Date   Arthritis    Asymptomatic LV dysfunction 10/18/2017   Basal cell carcinoma    Benign prostatic hyperplasia with incomplete bladder emptying 01/14/2019   Bone pain 03/08/2020   Cancer ([place])    throat - 1997, throat - 2018   Cardiomyopathy, secondary ([place]) 12/01/2016   Overview:  EF 47% 12/26/16   Chest pain syndrome 11/02/2017   Chronic coronary artery disease 10/18/2017   CKD (chronic kidney disease) stage 3, GFR 30-59 ml/min (HCC) 07/25/2019   COPD (chronic obstructive pulmonary disease) ([place])    Coronary artery disease    Coronary artery disease involving native coronary artery of native heart with angina pectoris ([place])  12/01/2016   Overview:  He has hx of IWMI in remote past, last cath in 2012 at [facility] showed chronic total occlusion of previously stented RCA, with good collaterals, a 40% LAD stenosis, inferior hypokinesis and EF 45%.    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   Long-term use of aspirin therapy 10/02/2018   Malfunction of penile prosthesis ([place]) 01/14/2019   Melanoma of back ([place])    melanoma on back   Memory loss 03/08/2020   Myalgia 03/08/2020   Myocardial infarction [facility]) 2000   2 stents   PAD (peripheral artery disease) ([place]) 10/18/2017   Peripheral vascular disease ([place])    iliac artery clot   Peyronie's disease 06/12/2019   Pharyngoesophageal dysphagia 08/12/2018   Pneumonia 02/2015   Pneumothorax 06/07/2020   Preoperative clearance 04/03/2018   Squamous cell carcinoma of larynx ([place]) 12/13/2016   Testicular pain, left 01/14/2019   Throat cancer ([place])    Tobacco use disorder 06/12/2019   Wears glasses  Past Surgical History:  Procedure Laterality Date   ABDOMINAL ANGIOGRAM N/A 01/13/2015   Procedure: ABDOMINAL ANGIOGRAM;  Surgeon: Rosetta Posner, MD;  Location: [facility] CATH LAB;  Service: Cardiovascular;  Laterality: N/A;   APPENDECTOMY     BACK SURGERY     CARDIAC CATHETERIZATION  2000/2012   with stents in [place]     bilateral   ESOPHAGOGASTRODUODENOSCOPY     KNEE SURGERY Left    MICROLARYNGOSCOPY WITH CO2 LASER AND EXCISION OF VOCAL CORD LESION N/A 11/23/2016   Procedure:  MICROLARYNGOSCOPY  AND EXCISION OF VOCAL CORD LESION;  Surgeon: [place] Montane, MD;  Location: [place];  Service: ENT;  Laterality: N/A;   MICROLARYNGOSCOPY WITH CO2 LASER AND EXCISION OF VOCAL CORD LESION N/A 01/11/2017   Procedure: MICROLARYNGOSCOPY WITH CO2 LASER AND EXCISION OF VOCAL CORD LESION;  Surgeon: Melissa Montane, MD;  Location: [place];  Service: ENT;  Laterality: N/A;   MICROLARYNGOSCOPY WITH LASER N/A 03/16/2015   Procedure: MICROLARYNGOSCOPY ;  Surgeon: [place] Montane, MD;  Location: [facility] OR;  Service: ENT;  Laterality: N/A;   peyronie's surgery     SHOULDER SURGERY Right    rotator cuff   SPINE SURGERY  09/2020   cervical surgery. steel plate, bone spurs, allograft.   THROAT SURGERY  1997   cancer removed    Family History  Problem Relation Age of Onset   Cancer Mother        bone   Hypertension Mother    Stroke Father    Hypertension Father    Healthy Child    Cancer Other        brain   Diabetes Other     Social History   Socioeconomic History   Marital status: Married    Spouse name: Not on file   Number of children: 4   Years of education: Not on file   Highest education level: GED or equivalent  Occupational History   Occupation: retired  Tobacco Use   Smoking status: Every Day    Packs/day: 0.50    Years: 50.00    Pack years: 25.00    Types: Cigarettes   Smokeless tobacco: Never   Tobacco comments:    down to .5 ppd  Vaping Use   Vaping Use: Former  Substance and Sexual Activity   Alcohol use: No    Alcohol/week: 0.0 standard drinks   Drug use: No   Sexual activity: Not on file  Other Topics Concern   Not on file  Social History Narrative   Lives with wife       One level      Right hand   Social Determinants of Health   Financial Resource Strain: Not on file  Food Insecurity: Not on file  Transportation Needs: Not on file  Physical Activity: Not on file  Stress: Not on file  Social Connections: Not on file  Intimate Partner Violence: Not on file     Outpatient Medications Prior to Visit  Medication Sig Dispense Refill   ALPRAZolam (XANAX) 0.25 MG tablet TAKE 1 TABLET BY MOUTH ONCE DAILY AS NEEDED FOR ANXIETY 30 tablet 2   amLODipine (NORVASC) 5 MG tablet TAKE 1 TABLET BY MOUTH ONCE DAILY . APPOINTMENT REQUIRED FOR FUTURE REFILLS 30 tablet 0   aspirin 81 MG tablet Take 162 mg by mouth daily.      BREO ELLIPTA 100-25 MCG/INH AEPB INHALE 1 PUFF BY  MOUTH ONCE DAILY 60 each 2   clopidogrel (PLAVIX) 75 MG tablet Take 1 tablet by mouth once daily 90 tablet 2   cyclobenzaprine (FLEXERIL) 10 MG tablet TAKE 1 TABLET BY MOUTH THREE TIMES DAILY AS NEEDED FOR  BACK  SPASMS 90 tablet 0   DULoxetine (CYMBALTA) 60 MG capsule Take 1 capsule by mouth twice daily 180 capsule 1   finasteride (PROSCAR) 5 MG tablet Take 1 tablet by mouth once daily 90 tablet 0   gabapentin (NEURONTIN) 400 MG capsule TAKE 1 CAPSULE BY MOUTH THREE TIMES DAILY 90 capsule 2   HYDROcodone-acetaminophen (NORCO/VICODIN) 5-325 MG tablet Take 1 tablet by mouth every 8 (eight) hours as needed for moderate pain. 90 tablet 0   ipratropium-albuterol (DUONEB) 0.5-2.5 (3) MG/3ML SOLN Take 3 mLs by nebulization every 6 (six) hours as needed (for shortness of breath/wheezing. (with bronchitis)).     metFORMIN (GLUCOPHAGE) 500 MG tablet Take 1 tablet by mouth twice daily 180 tablet 0   metoprolol tartrate (LOPRESSOR) 50 MG tablet Take 1 tablet by mouth twice daily 180 tablet 0   nirmatrelvir/ritonavir EUA (PAXLOVID) 20 x 150 MG & 10 x 100MG  TABS (Take nirmatrelvir 150 mg two tablets twice daily for 5 days and ritonavir 100 mg one tablet twice daily for 5 days) Patient GFR is 90 30 tablet 0   nitroGLYCERIN (NITROSTAT) 0.4 MG SL tablet Place 0.4 mg under the tongue every 5 (five) minutes as needed for chest pain.     nystatin cream (MYCOSTATIN) Apply 1 application topically 2 (two) times daily as needed for dry skin.     Omega-3 Fatty Acids (FISH OIL) 1000 MG CAPS Take 2 capsules (2,000 mg  total) by mouth in the morning and at bedtime. 180 capsule 12   omeprazole (PRILOSEC) 20 MG capsule Take 1 capsule by mouth once daily 90 capsule 0   rosuvastatin (CRESTOR) 40 MG tablet Take 1 tablet by mouth once daily 90 tablet 0   SPIRIVA RESPIMAT 2.5 MCG/ACT AERS Inhale 2 puffs into the lungs daily. 4 g 2   tamsulosin (FLOMAX) 0.4 MG CAPS capsule TAKE 2 CAPSULES BY MOUTH ONCE DAILY AFTER SUPPER 180 capsule 0   TOUJEO SOLOSTAR 300 UNIT/ML Solostar Pen INJECT 30 UNITS SUBCUTANEOUSLY ONCE DAILY 6 mL 0   traZODone (DESYREL) 150 MG tablet Take 1 tablet by mouth once daily 90 tablet 1   VENTOLIN HFA 108 (90 Base) MCG/ACT inhaler Inhale 2 puffs into the lungs as needed for shortness of breath or wheezing.  0   No facility-administered medications prior to visit.    Allergies:   Penicillins, Prednisone, Doxycycline, Iodinated contrast media, Penicillin g, and Tramadol   Social History   Tobacco Use   Smoking status: Every Day    Packs/day: 0.50    Years: 50.00    Pack years: 25.00    Types: Cigarettes   Smokeless tobacco: Never   Tobacco comments:    down to .5 ppd  Vaping Use   Vaping Use: Former  Substance Use Topics   Alcohol use: No    Alcohol/week: 0.0 standard drinks   Drug use: No     Review of Systems  Constitutional:  Negative for chills and fever.  HENT:  Positive for hearing loss.   Eyes:  Negative for redness.  Respiratory:  Positive for cough.   Cardiovascular:  Negative for palpitations and claudication.  Gastrointestinal:  Negative for heartburn and melena.  Genitourinary:  Negative for dysuria.  Musculoskeletal:  Negative for myalgias.  Neurological: Negative.   Psychiatric/Behavioral: Negative.      Labs/Other Tests and Data Reviewed:    Recent Labs: 12/15/2021: ALT 17; BUN 12; Creatinine 0.8; Hemoglobin 13.4; Platelets 206; Potassium 4.1; Sodium 138   Recent Lipid Panel Lab Results  Component Value Date/Time   CHOL 129 12/16/2020 09:13 AM   TRIG 311  (H) 12/16/2020 09:13 AM   HDL 26 (L) 12/16/2020 09:13 AM   CHOLHDL 5.0 12/16/2020 09:13 AM   LDLCALC 55 12/16/2020 09:13 AM    Wt Readings from Last 3 Encounters:  12/30/21 187 lb (84.8 kg)  12/15/21 199 lb 4.8 oz (90.4 kg)  11/02/21 194 lb 3.2 oz (88.1 kg)     Objective:    Vital Signs:  BP (!) 149/73    Pulse 76    Temp 98.7 F (37.1 C)    Ht 5\' 10"  (1.778 m)    Wt 187 lb (84.8 kg)    SpO2 91%    BMI 26.83 kg/m    Physical Exam reviewed  ASSESSMENT & PLAN:    Diagnoses and all orders for this visit: COPD with acute exacerbation (HCC) -     levofloxacin (LEVAQUIN) 500 MG tablet; Take 1 tablet (500 mg total) by mouth daily for 7 days. -     predniSONE (STERAPRED UNI-PAK 21 TAB) 10 MG (21) TBPK tablet; Take 6ills first day , then 5 pills day 2 and then cut down one pill day until gone Patient has copd, treat with levaquin and prednisone   .       COVID-19 Education: The signs and symptoms of COVID-19 were discussed with the patient and how to seek care for testing (follow up with PCP or arrange E-visit). The importance of social distancing was discussed today.   I spent 20  minutes dedicated to the care of this patient on the date of this encounter to include face-to-face time with the patient, as well as:   Follow Up:  In Person prn  Signed, Reinaldo Meeker, MD  12/30/2021 10:04 AM    [place]

## 2022-01-02 ENCOUNTER — Other Ambulatory Visit: Payer: Self-pay

## 2022-01-02 ENCOUNTER — Other Ambulatory Visit: Payer: Self-pay | Admitting: Family Medicine

## 2022-01-02 MED ORDER — HYDROCODONE-ACETAMINOPHEN 5-325 MG PO TABS: 1.0000 | ORAL_TABLET | Freq: Three times a day (TID) | ORAL | 0 refills | Status: DC | PRN

## 2022-01-05 ENCOUNTER — Other Ambulatory Visit: Payer: Self-pay

## 2022-01-05 ENCOUNTER — Ambulatory Visit (INDEPENDENT_AMBULATORY_CARE_PROVIDER_SITE_OTHER): Payer: Medicare Other | Admitting: Family Medicine

## 2022-01-05 ENCOUNTER — Ambulatory Visit: Payer: Medicare Other | Admitting: Physician Assistant

## 2022-01-05 VITALS — BP 138/72 | HR 60 | Temp 96.0°F | Resp 18 | Ht 69.5 in | Wt 199.0 lb

## 2022-01-05 DIAGNOSIS — R3914 Feeling of incomplete bladder emptying: Secondary | ICD-10-CM

## 2022-01-05 DIAGNOSIS — N401 Enlarged prostate with lower urinary tract symptoms: Secondary | ICD-10-CM

## 2022-01-05 DIAGNOSIS — M545 Low back pain, unspecified: Secondary | ICD-10-CM

## 2022-01-05 NOTE — Progress Notes (Signed)
Subjective:  Patient ID: Don [name], male    DOB: 23-Jul-1951  Age: 71 y.o. MRN: [id number]  Chief Complaint  Patient presents with   Back Pain   HPI Complaining of back pain. Began with pain periumbical region few months ago and then went to the left side of abdomen and then to the right side of abdomen, and then went to his upper lumbar region. No radicular pain. Intermittent. At times he can get up to 10/10. Bowels every other day. Patient started miralax and stool softener. Had EGD AND COLONOSCOPY IN 10/2022 which were normal.   Current Outpatient Medications on File Prior to Visit  Medication Sig Dispense Refill   ALPRAZolam (XANAX) 0.25 MG tablet TAKE 1 TABLET BY MOUTH ONCE DAILY AS NEEDED FOR ANXIETY 30 tablet 2   amLODipine (NORVASC) 5 MG tablet TAKE 1 TABLET BY MOUTH ONCE DAILY . APPOINTMENT REQUIRED FOR FUTURE REFILLS 30 tablet 0   aspirin 81 MG tablet Take 162 mg by mouth daily.      BREO ELLIPTA 100-25 MCG/INH AEPB INHALE 1 PUFF BY MOUTH ONCE DAILY 60 each 2   clopidogrel (PLAVIX) 75 MG tablet Take 1 tablet by mouth once daily 90 tablet 2   cyclobenzaprine (FLEXERIL) 10 MG tablet TAKE 1 TABLET BY MOUTH THREE TIMES DAILY AS NEEDED FOR  BACK  SPASMS 90 tablet 0   DULoxetine (CYMBALTA) 60 MG capsule Take 1 capsule by mouth twice daily 180 capsule 1   finasteride (PROSCAR) 5 MG tablet Take 1 tablet by mouth once daily 90 tablet 0   gabapentin (NEURONTIN) 400 MG capsule TAKE 1 CAPSULE BY MOUTH THREE TIMES DAILY 90 capsule 2   HYDROcodone-acetaminophen (NORCO/VICODIN) 5-325 MG tablet Take 1 tablet by mouth every 8 (eight) hours as needed for moderate pain. 90 tablet 0   ipratropium-albuterol (DUONEB) 0.5-2.5 (3) MG/3ML SOLN Take 3 mLs by nebulization every 6 (six) hours as needed (for shortness of breath/wheezing. (with bronchitis)).     metFORMIN (GLUCOPHAGE) 500 MG tablet Take 1 tablet by mouth twice daily 180 tablet 0   metoprolol tartrate (LOPRESSOR) 50 MG tablet Take 1  tablet by mouth twice daily 180 tablet 0   nitroGLYCERIN (NITROSTAT) 0.4 MG SL tablet Place 0.4 mg under the tongue every 5 (five) minutes as needed for chest pain.     nystatin cream (MYCOSTATIN) Apply 1 application topically 2 (two) times daily as needed for dry skin.     Omega-3 Fatty Acids (FISH OIL) 1000 MG CAPS Take 2 capsules (2,000 mg total) by mouth in the morning and at bedtime. 180 capsule 12   omeprazole (PRILOSEC) 20 MG capsule Take 1 capsule by mouth once daily 90 capsule 0   predniSONE (STERAPRED UNI-PAK 21 TAB) 10 MG (21) TBPK tablet Take 6ills first day , then 5 pills day 2 and then cut down one pill day until gone 21 tablet 0   rosuvastatin (CRESTOR) 40 MG tablet Take 1 tablet by mouth once daily 90 tablet 0   SPIRIVA RESPIMAT 2.5 MCG/ACT AERS Inhale 2 puffs into the lungs daily. 4 g 2   tamsulosin (FLOMAX) 0.4 MG CAPS capsule TAKE 2 CAPSULES BY MOUTH ONCE DAILY AFTER SUPPER 180 capsule 0   TOUJEO SOLOSTAR 300 UNIT/ML Solostar Pen INJECT 30 UNITS SUBCUTANEOUSLY ONCE DAILY 6 mL 0   traZODone (DESYREL) 150 MG tablet Take 1 tablet by mouth once daily 90 tablet 1   VENTOLIN HFA 108 (90 Base) MCG/ACT inhaler Inhale 2 puffs into the lungs  as needed for shortness of breath or wheezing.  0   No current facility-administered medications on file prior to visit.   Past Medical History:  Diagnosis Date   Arthritis    Asymptomatic LV dysfunction 10/18/2017   Basal cell carcinoma    Benign prostatic hyperplasia with incomplete bladder emptying 01/14/2019   Bone pain 03/08/2020   Cancer ([place])    throat - 1997, throat - 2018   Cardiomyopathy, secondary ([place]) 12/01/2016   Overview:  EF 47% 12/26/16   Chest pain syndrome 11/02/2017   Chronic coronary artery disease 10/18/2017   CKD (chronic kidney disease) stage 3, GFR 30-59 ml/min (HCC) 07/25/2019   COPD (chronic obstructive pulmonary disease) ([place])    Coronary artery disease    Coronary artery disease involving native coronary artery of  native heart with angina pectoris ([place]) 12/01/2016   Overview:  He has hx of IWMI in remote past, last cath in 2012 at [facility] showed chronic total occlusion of previously stented RCA, with good collaterals, a 40% LAD stenosis, inferior hypokinesis and EF 45%.    He's been lost to Cardiology f/u since 2015, but has not had any recurrent events   Depression    Diabetes mellitus ([place]) 03/08/2020   Diverticulitis 05/02/2020   Dyslipidemia 12/01/2016   Emphysema lung ([place])    Erectile dysfunction due to diseases classified elsewhere 06/12/2019   Essential hypertension 10/18/2017   GERD (gastroesophageal reflux disease)    Hoarseness 11/15/2016   Hyperlipidemia    Hypertension    Laceration of thumb, left 12/17/2015   Lesion of vocal cord    Leukoplakia of larynx 11/15/2016   Long-term use of aspirin therapy 10/02/2018   Malfunction of penile prosthesis ([place]) 01/14/2019   Melanoma of back ([place])    melanoma on back   Memory loss 03/08/2020   Myalgia 03/08/2020   Myocardial infarction [facility]) 2000   2 stents   PAD (peripheral artery disease) ([place]) 10/18/2017   Peripheral vascular disease ([place])    iliac artery clot   Peyronie's disease 06/12/2019   Pharyngoesophageal dysphagia 08/12/2018   Pneumonia 02/2015   Pneumothorax 06/07/2020   Preoperative clearance 04/03/2018   Squamous cell carcinoma of larynx ([place]) 12/13/2016   Testicular pain, left 01/14/2019   Throat cancer ([place])    Tobacco use disorder 06/12/2019   Wears glasses    Past Surgical History:  Procedure Laterality Date   ABDOMINAL ANGIOGRAM N/A 01/13/2015   Procedure: ABDOMINAL ANGIOGRAM;  Surgeon: Rosetta Posner, MD;  Location[facility] CATH LAB;  Service: Cardiovascular;  Laterality: N/A;   APPENDECTOMY     BACK SURGERY     CARDIAC CATHETERIZATION  2000/2012   with stents in [place]     bilateral   ESOPHAGOGASTRODUODENOSCOPY     KNEE SURGERY Left    MICROLARYNGOSCOPY WITH CO2 LASER AND EXCISION OF VOCAL  CORD LESION N/A 11/23/2016   Procedure: MICROLARYNGOSCOPY  AND EXCISION OF VOCAL CORD LESION;  Surgeon: Melissa Montane, MD;  Location: [place];  Service: ENT;  Laterality: N/A;   MICROLARYNGOSCOPY WITH CO2 LASER AND EXCISION OF VOCAL CORD LESION N/A 01/11/2017   Procedure: MICROLARYNGOSCOPY WITH CO2 LASER AND EXCISION OF VOCAL CORD LESION;  Surgeon: Melissa Montane, MD;  Location: [place];  Service: ENT;  Laterality: N/A;   MICROLARYNGOSCOPY WITH LASER N/A 03/16/2015   Procedure: MICROLARYNGOSCOPY ;  Surgeon: Melissa Montane, MD;  Location: [place];  Service: ENT;  Laterality:  N/A;   peyronie's surgery     SHOULDER SURGERY Right    rotator cuff   SPINE SURGERY  09/2020   cervical surgery. steel plate, bone spurs, allograft.   THROAT SURGERY  1997   cancer removed    Family History  Problem Relation Age of Onset   Cancer Mother        bone   Hypertension Mother    Stroke Father    Hypertension Father    Healthy Child    Cancer Other        brain   Diabetes Other    Social History   Socioeconomic History   Marital status: Married    Spouse name: Not on file   Number of children: 4   Years of education: Not on file   Highest education level: GED or equivalent  Occupational History   Occupation: retired  Tobacco Use   Smoking status: Every Day    Packs/day: 0.50    Years: 50.00    Pack years: 25.00    Types: Cigarettes   Smokeless tobacco: Never   Tobacco comments:    down to .5 ppd  Vaping Use   Vaping Use: Former  Substance and Sexual Activity   Alcohol use: No    Alcohol/week: 0.0 standard drinks   Drug use: No   Sexual activity: Not on file  Other Topics Concern   Not on file  Social History Narrative   Lives with wife       One level      Right hand   Social Determinants of Health   Financial Resource Strain: Not on file  Food Insecurity: Not on file  Transportation Needs: Not on file  Physical Activity: Not on file  Stress: Not on file  Social Connections: Not on file     Review of Systems  Constitutional:  Positive for fatigue. Negative for chills and fever.  HENT:  Positive for sneezing. Negative for congestion, rhinorrhea and sore throat.   Respiratory:  Positive for cough and shortness of breath.   Cardiovascular:  Negative for chest pain and palpitations.  Gastrointestinal:  Positive for constipation. Negative for abdominal pain, diarrhea, nausea and vomiting.  Genitourinary:  Negative for dysuria and urgency.  Musculoskeletal:  Positive for back pain. Negative for arthralgias and myalgias.  Neurological:  Positive for dizziness and headaches.  Psychiatric/Behavioral:  Negative for dysphoric mood. The patient is not nervous/anxious.     Objective:  BP 138/72    Pulse 60 Comment: regular irregular   Temp (!) 96 F (35.6 C)    Resp 18    Ht 5' 9.5" (1.765 m)    Wt 199 lb (90.3 kg)    SpO2 95%    BMI 28.97 kg/m   BP/Weight 01/05/2022 4/62/7035 0/0/9381  Systolic BP 829 937 169  Diastolic BP 72 73 73  Wt. (Lbs) 199 187 199.3  BMI 28.97 26.83 28.6    Physical Exam Vitals reviewed.  Constitutional:      Appearance: Normal appearance.  HENT:     Right Ear: Tympanic membrane, ear canal and external ear normal.     Left Ear: Tympanic membrane, ear canal and external ear normal.     Nose: Congestion present. No rhinorrhea.     Mouth/Throat:     Mouth: Mucous membranes are moist.     Pharynx: No oropharyngeal exudate or posterior oropharyngeal erythema.  Neck:     Vascular: No carotid bruit.  Cardiovascular:  Rate and Rhythm: Normal rate and regular rhythm.     Pulses: Normal pulses.     Heart sounds: Normal heart sounds.  Pulmonary:     Effort: Pulmonary effort is normal. No respiratory distress.     Breath sounds: Normal breath sounds. No wheezing, rhonchi or rales.  Abdominal:     General: Bowel sounds are normal.     Palpations: Abdomen is soft.     Tenderness: There is no abdominal tenderness.  Neurological:     Mental Status:  He is alert and oriented to person, place, and time.  Psychiatric:        Mood and Affect: Mood normal.        Behavior: Behavior normal.    Diabetic Foot Exam - Simple   No data filed      Lab Results  Component Value Date   WBC 7.4 12/15/2021   HGB 13.4 (A) 12/15/2021   HCT 40 (A) 12/15/2021   PLT 206 12/15/2021   GLUCOSE 166 (H) 11/02/2021   CHOL 129 12/16/2020   TRIG 311 (H) 12/16/2020   HDL 26 (L) 12/16/2020   LDLCALC 55 12/16/2020   ALT 17 12/15/2021   AST 21 12/15/2021   NA 138 12/15/2021   K 4.1 12/15/2021   CL 101 12/15/2021   CREATININE 0.8 12/15/2021   BUN 12 12/15/2021   CO2 30 (A) 12/15/2021   TSH 2.630 12/16/2020   INR 1.05 03/16/2015   HGBA1C 6.8 (H) 12/16/2020   MICROALBUR 30 05/23/2021      Assessment & Plan:   Problem List Items Addressed This Visit       Genitourinary   Benign prostatic hyperplasia with incomplete bladder emptying    Trial of gemtesa 75 mg daily.   Continue tamsulosin and finasteride.          Other   Acute bilateral low back pain without sciatica - Primary    Bring urinalysis back tomorrow please if possible . If back pain, recurs I would recommend ice, tylenol.      Relevant Orders   POCT urinalysis dipstick (Completed)  .  Orders Placed This Encounter  Procedures   POCT urinalysis dipstick   Follow-up: Return in about 4 weeks (around 02/02/2022) for chronic fasting.  An After Visit Summary was printed and given to the patient.  [name], MD [facility] [phone]

## 2022-01-05 NOTE — Patient Instructions (Signed)
Trial of gemtesa 75 mg daily.   Continue tamsulosin and finasteride.   Bring urinalysis back tomorrow please if possible . If back pain, recurs I would recommend ice, tylenol.

## 2022-01-06 ENCOUNTER — Other Ambulatory Visit: Payer: Self-pay | Admitting: Family Medicine

## 2022-01-06 LAB — POCT URINALYSIS DIPSTICK
Bilirubin, UA: NEGATIVE
Blood, UA: NEGATIVE
Glucose, UA: NEGATIVE
Ketones, UA: NEGATIVE
Leukocytes, UA: NEGATIVE
Nitrite, UA: NEGATIVE
Protein, UA: NEGATIVE
Spec Grav, UA: 1.025 (ref 1.010–1.025)
Urobilinogen, UA: NEGATIVE E.U./dL — AB
pH, UA: 6 (ref 5.0–8.0)

## 2022-01-06 NOTE — Telephone Encounter (Signed)
Refill sent to pharmacy.   

## 2022-01-08 DIAGNOSIS — R32 Unspecified urinary incontinence: Secondary | ICD-10-CM | POA: Insufficient documentation

## 2022-01-08 DIAGNOSIS — M545 Low back pain, unspecified: Secondary | ICD-10-CM | POA: Insufficient documentation

## 2022-01-08 HISTORY — DX: Unspecified urinary incontinence: R32

## 2022-01-08 HISTORY — DX: Low back pain, unspecified: M54.50

## 2022-01-08 NOTE — Assessment & Plan Note (Signed)
Trial of gemtesa 75 mg daily.   Continue tamsulosin and finasteride.

## 2022-01-08 NOTE — Assessment & Plan Note (Signed)
Bring urinalysis back tomorrow please if possible . If back pain, recurs I would recommend ice, tylenol.

## 2022-01-10 ENCOUNTER — Telehealth: Payer: Medicare Other

## 2022-01-13 DIAGNOSIS — M545 Low back pain, unspecified: Secondary | ICD-10-CM | POA: Diagnosis not present

## 2022-01-13 DIAGNOSIS — M5416 Radiculopathy, lumbar region: Secondary | ICD-10-CM | POA: Diagnosis not present

## 2022-01-13 DIAGNOSIS — M4802 Spinal stenosis, cervical region: Secondary | ICD-10-CM | POA: Diagnosis not present

## 2022-01-14 DIAGNOSIS — J9610 Chronic respiratory failure, unspecified whether with hypoxia or hypercapnia: Secondary | ICD-10-CM | POA: Diagnosis not present

## 2022-01-14 DIAGNOSIS — J449 Chronic obstructive pulmonary disease, unspecified: Secondary | ICD-10-CM | POA: Diagnosis not present

## 2022-01-15 ENCOUNTER — Encounter: Payer: Self-pay | Admitting: Family Medicine

## 2022-01-17 ENCOUNTER — Other Ambulatory Visit: Payer: Self-pay | Admitting: Family Medicine

## 2022-01-17 DIAGNOSIS — K2100 Gastro-esophageal reflux disease with esophagitis, without bleeding: Secondary | ICD-10-CM

## 2022-01-19 ENCOUNTER — Encounter: Payer: Self-pay | Admitting: Family Medicine

## 2022-01-20 ENCOUNTER — Other Ambulatory Visit: Payer: Self-pay

## 2022-01-20 DIAGNOSIS — N401 Enlarged prostate with lower urinary tract symptoms: Secondary | ICD-10-CM

## 2022-01-20 MED ORDER — GEMTESA 75 MG PO TABS: 1.0000 | ORAL_TABLET | Freq: Every day | ORAL | 0 refills | Status: DC

## 2022-01-21 ENCOUNTER — Other Ambulatory Visit: Payer: Self-pay | Admitting: Family Medicine

## 2022-01-21 DIAGNOSIS — K2100 Gastro-esophageal reflux disease with esophagitis, without bleeding: Secondary | ICD-10-CM

## 2022-01-23 ENCOUNTER — Other Ambulatory Visit: Payer: Self-pay

## 2022-01-24 ENCOUNTER — Other Ambulatory Visit: Payer: Self-pay | Admitting: Family Medicine

## 2022-01-24 MED ORDER — MIRABEGRON ER 25 MG PO TB24: 25.0000 mg | ORAL_TABLET | Freq: Every day | ORAL | 0 refills | Status: DC

## 2022-02-02 DIAGNOSIS — C140 Malignant neoplasm of pharynx, unspecified: Secondary | ICD-10-CM | POA: Insufficient documentation

## 2022-02-02 DIAGNOSIS — F0630 Mood disorder due to known physiological condition, unspecified: Secondary | ICD-10-CM | POA: Insufficient documentation

## 2022-02-02 DIAGNOSIS — C801 Malignant (primary) neoplasm, unspecified: Secondary | ICD-10-CM | POA: Insufficient documentation

## 2022-02-02 DIAGNOSIS — F411 Generalized anxiety disorder: Secondary | ICD-10-CM | POA: Insufficient documentation

## 2022-02-02 HISTORY — DX: Mood disorder due to known physiological condition, unspecified: F06.30

## 2022-02-02 HISTORY — DX: Generalized anxiety disorder: F41.1

## 2022-02-03 ENCOUNTER — Other Ambulatory Visit: Payer: Self-pay | Admitting: Family Medicine

## 2022-02-03 ENCOUNTER — Ambulatory Visit: Payer: Medicare Other | Admitting: Cardiology

## 2022-02-03 ENCOUNTER — Other Ambulatory Visit: Payer: Self-pay | Admitting: Physician Assistant

## 2022-02-03 DIAGNOSIS — E782 Mixed hyperlipidemia: Secondary | ICD-10-CM

## 2022-02-06 ENCOUNTER — Other Ambulatory Visit: Payer: Self-pay | Admitting: Family Medicine

## 2022-02-06 ENCOUNTER — Ambulatory Visit: Payer: Medicare Other | Admitting: Cardiology

## 2022-02-06 MED ORDER — HYDROCODONE-ACETAMINOPHEN 5-325 MG PO TABS: 1.0000 | ORAL_TABLET | Freq: Three times a day (TID) | ORAL | 0 refills | Status: DC | PRN

## 2022-02-07 ENCOUNTER — Other Ambulatory Visit: Payer: Self-pay | Admitting: Family Medicine

## 2022-02-14 DIAGNOSIS — J449 Chronic obstructive pulmonary disease, unspecified: Secondary | ICD-10-CM | POA: Diagnosis not present

## 2022-02-14 DIAGNOSIS — J9610 Chronic respiratory failure, unspecified whether with hypoxia or hypercapnia: Secondary | ICD-10-CM | POA: Diagnosis not present

## 2022-02-16 ENCOUNTER — Telehealth: Payer: Self-pay | Admitting: Cardiology

## 2022-02-16 ENCOUNTER — Other Ambulatory Visit: Payer: Self-pay | Admitting: Family Medicine

## 2022-02-16 NOTE — Telephone Encounter (Signed)
LVMTCB 4/6 ?

## 2022-02-16 NOTE — Telephone Encounter (Signed)
He?s having a higher heart rate  ?Not feeling good we have a appointment in May 25 th  ?But I?m worried  ? ?Does patient need to be seen sooner than may 25? ? ?Please advise, ? ?Thank you ? ?

## 2022-02-17 ENCOUNTER — Telehealth (INDEPENDENT_AMBULATORY_CARE_PROVIDER_SITE_OTHER): Payer: Medicare Other | Admitting: Legal Medicine

## 2022-02-17 ENCOUNTER — Encounter: Payer: Self-pay | Admitting: Legal Medicine

## 2022-02-17 VITALS — BP 130/66 | HR 68 | Ht 69.5 in | Wt 199.0 lb

## 2022-02-17 DIAGNOSIS — J441 Chronic obstructive pulmonary disease with (acute) exacerbation: Secondary | ICD-10-CM | POA: Diagnosis not present

## 2022-02-17 DIAGNOSIS — U071 COVID-19: Secondary | ICD-10-CM

## 2022-02-17 MED ORDER — NIRMATRELVIR/RITONAVIR (PAXLOVID)TABLET: 3.0000 | ORAL_TABLET | Freq: Two times a day (BID) | ORAL | 0 refills | Status: AC

## 2022-02-17 MED ORDER — LEVOFLOXACIN 500 MG PO TABS: 500.0000 mg | ORAL_TABLET | Freq: Every day | ORAL | 0 refills | Status: AC

## 2022-02-17 NOTE — Telephone Encounter (Signed)
Spoke with Anne Ng per DPR who states that the pt currently has COVID. She states that since pt had aspiration pneumonia 12/22 his heart rate is staying higher than normal. HR is 70-80and BP is usually 120/60. Pt is on Lopressor 50 mg BID. She is concerned that this is putting stress on his heart. I have moved his appt to 4/26. Please advise ?

## 2022-02-17 NOTE — Progress Notes (Signed)
? ?Virtual Visit via Video Note  ? ?This visit type was conducted due to national recommendations for restrictions regarding the COVID-19 Pandemic (e.g. social distancing) in an effort to limit this patient's exposure and mitigate transmission in our community.  Due to his co-morbid illnesses, this patient is at least at moderate risk for complications without adequate follow up.  This format is felt to be most appropriate for this patient at this time.  All issues noted in this document were discussed and addressed.  A limited physical exam was performed with this format.  A verbal consent was obtained for the virtual visit.  ? ?Date:  02/17/2022  ? ?ID:  Don [name], DOB [birth year], MRN [id number] ? ?Patient Location: Home ?Provider Location: Office/Clinic ? ?PCP:  [name], MD  ? ?Evaluation Performed:  New Patient Evaluation ? ?Chief Complaint:  Covid Positive ? ?History of Present Illness:   ? ?Don [name] is a 71 y.o. male with cough, body aches since one day ago. His wife had covid and today he did covid test and it was positive. He is concerned because he has COPD. ? ?The patient does have symptoms concerning for COVID-19 infection (fever, chills, cough, or new shortness of breath).  ? ? ?Past Medical History:  ?Diagnosis Date  ? Absence of bladder continence 01/08/2022  ? Acute bilateral low back pain without sciatica 01/08/2022  ? Anxiety state 02/02/2022  ? Arthritis   ? Asymptomatic LV dysfunction 10/18/2017  ? B12 deficiency anemia 08/25/2021  ? Basal cell carcinoma   ? Benign prostatic hyperplasia with incomplete bladder emptying 01/14/2019  ? Blood in ear canal, left 05/13/2021  ? Bone pain 03/08/2020  ? Cancer [facility])   ? throat - 1997, throat - 2018  ? Cardiomyopathy, secondary ([place]) 12/01/2016  ? Overview:  EF 47% 12/26/16  ? Chest pain syndrome 11/02/2017  ? Chronic coronary artery disease 10/18/2017  ? Chronic ischemic heart disease 11/13/1998  ? Jan 22, 2003 Entered By: Jani Files  Comment:  massive Mi in 2000 per patient hsitory, 2nd Mi in 2001Mar 11, 2004 Entered By: Marland Kitchen J Comment: Had stent placedin 2000,cath/angio in 2001  ? CKD (chronic kidney disease) stage 3, GFR 30-59 ml/min (HCC) 07/25/2019  ? COPD (chronic obstructive pulmonary disease) ([place])   ? Coronary artery disease   ? Coronary artery disease involving native coronary artery of native heart with angina pectoris ([place]) 12/01/2016  ? Overview:  He has hx of IWMI in remote past, last cath in 2012 at [facility] showed chronic total occlusion of previously stented RCA, with good collaterals, a 40% LAD stenosis, inferior hypokinesis and EF 45%.    He's been lost to Cardiology f/u since 2015, but has not had any recurrent events  ? Deficiency anemia 08/25/2021  ? Depression   ? Diabetes mellitus ([place]) 03/08/2020  ? Diverticulitis 05/02/2020  ? Dyslipidemia 12/01/2016  ? Emphysema lung ([place])   ? Erectile dysfunction due to diseases classified elsewhere 06/12/2019  ? Essential hypertension 10/18/2017  ? GERD (gastroesophageal reflux disease)   ? Hoarseness 11/15/2016  ? Hyperlipidemia   ? Hypertension   ? Insomnia 01/28/2021  ? Iron deficiency anemia due to chronic blood loss 08/25/2021  ? Laceration of thumb, left 12/17/2015  ? Lesion of vocal cord   ? Leukoplakia of larynx 11/15/2016  ? Long-term use of aspirin therapy 10/02/2018  ? Malfunction of penile prosthesis ([place]) 01/14/2019  ? Malignant neoplasm of skin 01/28/2021  ? Formatting of this note might be  different from the original. Jan 22, 2003 Entered By: Marland Kitchen J Comment: of skin - removed x2 inpast Jan 22, 2003 Entered By: Jani Files Comment: of skin - removed x2 inpast  ? Melanoma of back ([place])   ? melanoma on back  ? Memory loss 03/08/2020  ? Mood disorder in conditions classified elsewhere 02/02/2022  ? Myalgia 03/08/2020  ? Myocardial infarction [facility]) 2000  ? 2 stents  ? Other ill-defined and unknown causes of morbidity and mortality 11/13/1993  ? Formatting of this  note might be different from the original. Jan 31, 2008 Entered By: ARMOUR,ROSS B Comment: lumbar, 8/08 Jan 22, 2003 Entered By: Jani Files Comment: x46yr in work place  quit 351yrago in 2000 Jan 22, 2003 Entered By: MAMarland Kitchen Comment: [id number] work place  quit 78yr45yro in 2000 Jan 31, 2008 Entered By: ARMOStephannie Petersomment: lumbar, 8/08  ? PAD (peripheral artery disease) (HCC)[place]/04/2017  ? Peripheral vascular disease (HCC)[place]? iliac artery clot  ? Peyronie's disease 06/12/2019  ? Pharyngoesophageal dysphagia 08/12/2018  ? Pneumonia 02/2015  ? Pneumothorax 06/07/2020  ? Preoperative clearance 04/03/2018  ? Squamous cell carcinoma of larynx (HCC)[place]/31/2018  ? Testicular pain, left 01/14/2019  ? Throat cancer (HCC)[place]? Tobacco use disorder 06/12/2019  ? ? ?Past Surgical History:  ?Procedure Laterality Date  ? ABDOMINAL ANGIOGRAM N/A 01/13/2015  ? Procedure: ABDOMINAL ANGIOGRAM;  Surgeon: ToddRosetta Posner;  [facility];  Service: Cardiovascular;  Laterality: N/A;  ? APPENDECTOMY    ? BACK SURGERY    ? CARDIAC CATHETERIZATION  2000/2012  ? with stents in 2000  ? CHOLECYSTECTOMY    ? COLONOSCOPY    ? ELBOW SURGERY    ? bilateral  ? ESOPHAGOGASTRODUODENOSCOPY    ? KNEE SURGERY Left   ? MICROLARYNGOSCOPY WITH CO2 LASER AND EXCISION OF VOCAL CORD LESION N/A 11/23/2016  ? Procedure: MICROLARYNGOSCOPY  AND EXCISION OF VOCAL CORD LESION;  Surgeon: JohnMelissa Montane;  [facility] [place] [place];  Laterality: N/A;  ? MICROLARYNGOSCOPY WITH CO2 LASER AND EXCISION OF VOCAL CORD LESION N/A 01/11/2017  ? Procedure: MICROLARYNGOSCOPY WITH CO2 LASER AND EXCISION OF VOCAL CORD LESION;  Surgeon: JohnMelissa Montane;  [place] [facility] [place];  Laterality: N/A;  ? MICROLARYNGOSCOPY WITH LASER N/A 03/16/2015  ? Procedure: MICROLARYNGOSCOPY ;  Surgeon: JohnMelissa Montane;  [facility] [place];  Laterality: N/A;  ? peyronie's surgery    ? SHOULDER SURGERY Right   ? rotator cuff  ? SPINE SURGERY  09/2020  ?  cervical surgery. steel plate, bone spurs, allograft.  ? THROAT SURGERY  1997  ? cancer removed  ? ? ?Family History  ?Problem Relation Age of Onset  ? Cancer Mother   ?     bone  ? Hypertension Mother   ? Stroke Father   ? Hypertension Father   ? Healthy Child   ? Cancer Other   ?     brain  ? Diabetes Other   ? ? ?Social History  ? ?Socioeconomic History  ? Marital status: Married  ?  Spouse name: Not on file  ? Number of children: 4  ? Years of education: Not on file  ? Highest education level: GED or equivalent  ?Occupational History  ? Occupation: retired  ?Tobacco Use  ? Smoking status: Every Day  ?  Packs/day: 0.50  ?  Years: 50.00  ?  Pack years:  25.00  ?  Types: Cigarettes  ? Smokeless tobacco: Never  ? Tobacco comments:  ?  down to .5 ppd  ?Vaping Use  ? Vaping Use: Former  ?Substance and Sexual Activity  ? Alcohol use: No  ?  Alcohol/week: 0.0 standard drinks  ? Drug use: No  ? Sexual activity: Not on file  ?Other Topics Concern  ? Not on file  ?Social History Narrative  ? Lives with wife   ?   ? One level  ?   ? Right hand  ? ?Social Determinants of Health  ? ?Financial Resource Strain: Not on file  ?Food Insecurity: Not on file  ?Transportation Needs: Not on file  ?Physical Activity: Not on file  ?Stress: Not on file  ?Social Connections: Not on file  ?Intimate Partner Violence: Not on file  ? ? ?Outpatient Medications Prior to Visit  ?Medication Sig Dispense Refill  ? ALPRAZolam (XANAX) 0.25 MG tablet TAKE 1 TABLET BY MOUTH ONCE DAILY AS NEEDED FOR ANXIETY 30 tablet 2  ? amLODipine (NORVASC) 5 MG tablet TAKE 1 TABLET BY MOUTH ONCE DAILY . APPOINTMENT REQUIRED FOR FUTURE REFILLS 30 tablet 0  ? aspirin 81 MG tablet Take 162 mg by mouth daily.     ? clopidogrel (PLAVIX) 75 MG tablet Take 1 tablet by mouth once daily 90 tablet 2  ? cyclobenzaprine (FLEXERIL) 10 MG tablet TAKE 1 TABLET BY MOUTH THREE TIMES DAILY AS NEEDED FOR  BACK  SPASMS 90 tablet 0  ? DULoxetine (CYMBALTA) 60 MG capsule Take 1 capsule by  mouth twice daily 180 capsule 1  ? finasteride (PROSCAR) 5 MG tablet Take 1 tablet by mouth once daily 90 tablet 1  ? gabapentin (NEURONTIN) 400 MG capsule TAKE 1 CAPSULE BY MOUTH THREE TIMES DAILY 90 capsule

## 2022-02-20 ENCOUNTER — Other Ambulatory Visit: Payer: Self-pay | Admitting: Family Medicine

## 2022-02-20 NOTE — Telephone Encounter (Signed)
Message left for pt to call back.

## 2022-02-22 ENCOUNTER — Encounter: Payer: Self-pay | Admitting: Cardiology

## 2022-02-22 NOTE — Telephone Encounter (Signed)
Spoke with Anne Ng per DPR and recommendations given per Dr. Geraldo Pitter. Offered an appt at [facility] but wife declined. Wife states that she will call back once discussing with Dr. Tobie Poet. ?

## 2022-02-23 NOTE — Telephone Encounter (Signed)
Spoke with Anne Ng on 4/12 and advised of recommendations. Anne Ng states she will let me know if she would like to come to [facility] to be seen as she declined. ?

## 2022-02-24 ENCOUNTER — Telehealth: Payer: Self-pay

## 2022-02-24 NOTE — Telephone Encounter (Signed)
Anne Ng called to report that Don [name] completed his Levaquin yesterday but he continues to have some cough. He has no fever but continues to have a productive cough.  She was instructed to have him continue his mucinex as directed and drink plenty of fluids.  She was instructed to call us back if his symptoms do not continue to improve.   ?

## 2022-02-28 ENCOUNTER — Other Ambulatory Visit: Payer: Self-pay | Admitting: Physician Assistant

## 2022-02-28 ENCOUNTER — Other Ambulatory Visit: Payer: Self-pay | Admitting: Family Medicine

## 2022-02-28 NOTE — Telephone Encounter (Signed)
Refill sent to pharmacy.   

## 2022-03-01 ENCOUNTER — Other Ambulatory Visit: Payer: Self-pay | Admitting: Family Medicine

## 2022-03-01 ENCOUNTER — Telehealth: Payer: Self-pay

## 2022-03-01 MED ORDER — LEVOFLOXACIN 750 MG PO TABS
750.0000 mg | ORAL_TABLET | Freq: Every day | ORAL | 0 refills | Status: DC
Start: 2022-03-01 — End: 2022-03-14

## 2022-03-01 MED ORDER — PREDNISONE 20 MG PO TABS: ORAL_TABLET | ORAL | 0 refills | Status: AC

## 2022-03-01 NOTE — Telephone Encounter (Signed)
Patient's wife called and states patient is still coughing up film and states he may need levofloxacin (LEVAQUIN) 500 MG tablet another 7 days as he usually does. She wants to know can you send a refill in for it. Dr. Henrene Pastor done a video visit on 02/17/2022 for positive covid test and gave him levofloxacin and Paxlovid. ?

## 2022-03-02 ENCOUNTER — Other Ambulatory Visit: Payer: Self-pay | Admitting: Family Medicine

## 2022-03-02 NOTE — Telephone Encounter (Signed)
Refill sent to pharmacy.   

## 2022-03-07 ENCOUNTER — Telehealth: Payer: Self-pay | Admitting: *Deleted

## 2022-03-07 ENCOUNTER — Other Ambulatory Visit: Payer: Self-pay

## 2022-03-07 NOTE — Chronic Care Management (AMB) (Signed)
?  Care Management  ? ?Note ? ?03/07/2022 ?Name: Don [name] MRN: [id number] DOB: [birth year] ? ?Don [name] is a 71 y.o. year old male who is a primary care patient of Cox, Kirsten, MD and is actively engaged with the care management team. I reached out to [employer] by phone today to assist with re-scheduling an initial visit with the RN Case Manager ? ?Follow up plan: ?Unsuccessful telephone outreach attempt made. A HIPAA compliant phone message was left for the patient providing contact information and requesting a return call.  ?The care management team will reach out to the patient again over the next 7 days.  ?If patient returns call to provider office, please advise to call [place] at [phone]. ? ?Laverda Sorenson  ?Care Guide, Embedded Care Coordination ?[place]  Care Management  ?Direct Dial: [phone] ? ?

## 2022-03-08 ENCOUNTER — Ambulatory Visit: Payer: Medicare Other | Admitting: Cardiology

## 2022-03-08 ENCOUNTER — Other Ambulatory Visit: Payer: Self-pay | Admitting: Family Medicine

## 2022-03-08 ENCOUNTER — Encounter: Payer: Self-pay | Admitting: Cardiology

## 2022-03-13 ENCOUNTER — Ambulatory Visit: Payer: Medicare Other | Admitting: Family Medicine

## 2022-03-13 ENCOUNTER — Other Ambulatory Visit: Payer: Self-pay | Admitting: Family Medicine

## 2022-03-13 MED ORDER — HYDROCODONE-ACETAMINOPHEN 5-325 MG PO TABS: 1.0000 | ORAL_TABLET | Freq: Three times a day (TID) | ORAL | 0 refills | Status: DC | PRN

## 2022-03-13 NOTE — Telephone Encounter (Signed)
Will be seeing pain clinic in June.  ? ?Don [name] 03/13/22 11:20 AM ? ?

## 2022-03-13 NOTE — Chronic Care Management (AMB) (Signed)
?  Care Management  ? ?Note ? ?03/13/2022 ?Name: RAJVIR ERNSTER MRN: [id number] DOB: 28-May-1951 ? ?Martha R Si is a 71 y.o. year old male who is a primary care patient of Cox, Kirsten, MD and is actively engaged with the care management team. I reached out to [employer] by phone today to assist with re-scheduling an initial visit with the RN Case Manager ? ?Follow up plan: ?Telephone appointment with care management team member scheduled for:04/04/22 ? ?Laverda Sorenson  ?Care Guide, Embedded Care Coordination ?  Care Management  ?Direct Dial: [phone] ? ?

## 2022-03-14 ENCOUNTER — Ambulatory Visit (INDEPENDENT_AMBULATORY_CARE_PROVIDER_SITE_OTHER): Payer: Medicare Other | Admitting: Family Medicine

## 2022-03-14 ENCOUNTER — Encounter: Payer: Self-pay | Admitting: Family Medicine

## 2022-03-14 VITALS — BP 148/72 | HR 83 | Temp 97.4°F | Ht 69.5 in | Wt 198.0 lb

## 2022-03-14 DIAGNOSIS — R109 Unspecified abdominal pain: Secondary | ICD-10-CM | POA: Diagnosis not present

## 2022-03-14 DIAGNOSIS — R10814 Left lower quadrant abdominal tenderness: Secondary | ICD-10-CM | POA: Diagnosis not present

## 2022-03-14 HISTORY — DX: Left lower quadrant abdominal tenderness: R10.814

## 2022-03-14 HISTORY — DX: Unspecified abdominal pain: R10.9

## 2022-03-14 LAB — CBC WITH DIFFERENTIAL/PLATELET
Basophils Absolute: 0.1 10*3/uL (ref 0.0–0.2)
Basos: 1 %
EOS (ABSOLUTE): 0.3 10*3/uL (ref 0.0–0.4)
Eos: 3 %
Hematocrit: 41.4 % (ref 37.5–51.0)
Hemoglobin: 13.9 g/dL (ref 13.0–17.7)
Immature Grans (Abs): 0.1 10*3/uL (ref 0.0–0.1)
Immature Granulocytes: 1 %
Lymphocytes Absolute: 2.8 10*3/uL (ref 0.7–3.1)
Lymphs: 23 %
MCH: 28 pg (ref 26.6–33.0)
MCHC: 33.6 g/dL (ref 31.5–35.7)
MCV: 84 fL (ref 79–97)
Monocytes Absolute: 1 10*3/uL — ABNORMAL HIGH (ref 0.1–0.9)
Monocytes: 8 %
Neutrophils Absolute: 7.8 10*3/uL — ABNORMAL HIGH (ref 1.4–7.0)
Neutrophils: 64 %
Platelets: 183 10*3/uL (ref 150–450)
RBC: 4.96 x10E6/uL (ref 4.14–5.80)
RDW: 14.1 % (ref 11.6–15.4)
WBC: 12 10*3/uL — ABNORMAL HIGH (ref 3.4–10.8)

## 2022-03-14 LAB — POCT URINALYSIS DIP (CLINITEK)
Bilirubin, UA: NEGATIVE
Blood, UA: NEGATIVE
Glucose, UA: NEGATIVE mg/dL
Ketones, POC UA: NEGATIVE mg/dL
Leukocytes, UA: NEGATIVE
Nitrite, UA: NEGATIVE
Spec Grav, UA: 1.015 (ref 1.010–1.025)
Urobilinogen, UA: 0.2 E.U./dL
pH, UA: 6 (ref 5.0–8.0)

## 2022-03-14 LAB — COMPREHENSIVE METABOLIC PANEL
ALT: 11 IU/L (ref 0–44)
AST: 7 IU/L (ref 0–40)
Albumin/Globulin Ratio: 1.9 (ref 1.2–2.2)
Albumin: 4 g/dL (ref 3.7–4.7)
Alkaline Phosphatase: 88 IU/L (ref 44–121)
BUN/Creatinine Ratio: 12 (ref 10–24)
BUN: 10 mg/dL (ref 8–27)
Bilirubin Total: 0.3 mg/dL (ref 0.0–1.2)
CO2: 27 mmol/L (ref 20–29)
Calcium: 8.9 mg/dL (ref 8.6–10.2)
Chloride: 97 mmol/L (ref 96–106)
Creatinine, Ser: 0.85 mg/dL (ref 0.76–1.27)
Globulin, Total: 2.1 g/dL (ref 1.5–4.5)
Glucose: 355 mg/dL — ABNORMAL HIGH (ref 70–99)
Potassium: 4.5 mmol/L (ref 3.5–5.2)
Sodium: 137 mmol/L (ref 134–144)
Total Protein: 6.1 g/dL (ref 6.0–8.5)
eGFR: 92 mL/min/{1.73_m2} (ref >59)

## 2022-03-14 MED ORDER — METRONIDAZOLE 500 MG PO TABS: 500.0000 mg | ORAL_TABLET | Freq: Two times a day (BID) | ORAL | 0 refills | Status: DC

## 2022-03-14 MED ORDER — CIPROFLOXACIN HCL 500 MG PO TABS
500.0000 mg | ORAL_TABLET | Freq: Two times a day (BID) | ORAL | 0 refills | Status: AC
Start: 2022-03-14 — End: 2022-03-21

## 2022-03-14 NOTE — Patient Instructions (Signed)
Concerning for diverticulitis.  ?Check labs.  ?Start on cipro and flagyl.  ?If does not improve, but worsens I would recommend calll back and go to ct scan of abdomen.  ?

## 2022-03-14 NOTE — Assessment & Plan Note (Signed)
I am concerned about diverticulitis ?

## 2022-03-14 NOTE — Assessment & Plan Note (Signed)
Concerning for diverticulitis.  ?Check labs.  ?Start on cipro and flagyl.  ?If does not improve, but worsens I would recommend calll back and go to ct scan of abdomen.  ?

## 2022-03-14 NOTE — Progress Notes (Signed)
? ?Acute Office Visit ? ?Subjective:  ? ? Patient ID: Don [name], male    DOB: 11-08-1951, 71 y.o.   MRN: [id number] ? ?Chief Complaint  ?Patient presents with  ? Back pain/Abdominal pain  ? ?HPI: ?Patient is in today complaining of abdominal pain and back pain since December after his colonoscopy, but has worsened in the last 2 weeks.  Has intermittent abdominal pain.  He is unsure what triggers it.  Nothing alleviates it.  He also reports some burning in his stomach.  Using stool softener every day which has helped. For 2 weeks prior to last week the patient was having hard bowel movements every 2 days and required a laxative.  He also required an enema 1 day.  Patient had nausea but no vomiting.  Her symptoms. ?Bms every 2 days with laxative.  ? ?Patient followed up with Dr. Donivan Scull about 2 years ago and he recommended either another surgery to place rods in his back OR to go to pain clinic. They had an appt with the pain clinic due to him getting covid 19. Back and abdominal pain worsened after covid 19.  ?I refilled hydrocodone/apap, but is not helping. His back is terrible. ?Taking Tums..  ? ?Past Medical History:  ?Diagnosis Date  ? Absence of bladder continence 01/08/2022  ? Acute bilateral low back pain without sciatica 01/08/2022  ? Anxiety state 02/02/2022  ? Arthritis   ? Asymptomatic LV dysfunction 10/18/2017  ? B12 deficiency anemia 08/25/2021  ? Basal cell carcinoma   ? Benign prostatic hyperplasia with incomplete bladder emptying 01/14/2019  ? Blood in ear canal, left 05/13/2021  ? Bone pain 03/08/2020  ? Cancer [facility])   ? throat - 1997, throat - 2018  ? Cardiomyopathy, secondary ([place]) 12/01/2016  ? Overview:  EF 47% 12/26/16  ? Chest pain syndrome 11/02/2017  ? Chronic coronary artery disease 10/18/2017  ? Chronic ischemic heart disease 11/13/1998  ? Jan 22, 2003 Entered By: Jani Files Comment:  massive Mi in 2000 per patient hsitory, 2nd Mi in 2001Mar 11, 2004 Entered By: Marland Kitchen J  Comment: Had stent placedin 2000,cath/angio in 2001  ? CKD (chronic kidney disease) stage 3, GFR 30-59 ml/min (HCC) 07/25/2019  ? COPD (chronic obstructive pulmonary disease) ([place])   ? Coronary artery disease   ? Coronary artery disease involving native coronary artery of native heart with angina pectoris ([place]) 12/01/2016  ? Overview:  He has hx of IWMI in remote past, last cath in 2012 at [facility] showed chronic total occlusion of previously stented RCA, with good collaterals, a 40% LAD stenosis, inferior hypokinesis and EF 45%.    He's been lost to Cardiology f/u since 2015, but has not had any recurrent events  ? Deficiency anemia 08/25/2021  ? Depression   ? Diabetes mellitus ([place]) 03/08/2020  ? Diverticulitis 05/02/2020  ? Dyslipidemia 12/01/2016  ? Emphysema lung ([place])   ? Erectile dysfunction due to diseases classified elsewhere 06/12/2019  ? Essential hypertension 10/18/2017  ? GERD (gastroesophageal reflux disease)   ? Hoarseness 11/15/2016  ? Hyperlipidemia   ? Hypertension   ? Insomnia 01/28/2021  ? Iron deficiency anemia due to chronic blood loss 08/25/2021  ? Laceration of thumb, left 12/17/2015  ? Lesion of vocal cord   ? Leukoplakia of larynx 11/15/2016  ? Long-term use of aspirin therapy 10/02/2018  ? Malfunction of penile prosthesis ([place]) 01/14/2019  ? Malignant neoplasm of skin 01/28/2021  ? Formatting of this note might be different from the  original. Jan 22, 2003 Entered By: Marland Kitchen J Comment: of skin - removed x2 inpast Jan 22, 2003 Entered By: Jani Files Comment: of skin - removed x2 inpast  ? Melanoma of back ([place])   ? melanoma on back  ? Memory loss 03/08/2020  ? Mood disorder in conditions classified elsewhere 02/02/2022  ? Myalgia 03/08/2020  ? Myocardial infarction [facility]) 2000  ? 2 stents  ? Other ill-defined and unknown causes of morbidity and mortality 11/13/1993  ? Formatting of this note might be different from the original. Jan 31, 2008 Entered By: ARMOUR,ROSS B Comment: lumbar, 8/08  Jan 22, 2003 Entered By: Jani Files Comment: x72yr in work place  quit 310yrago in 2000 Jan 22, 2003 Entered By: MAMarland Kitchen Comment: x2538yrn work place  quit 77yr40yro in 2000 Jan 31, 2008 Entered By: ARMOStephannie Petersomment: lumbar, 8/08  ? PAD (peripheral artery disease) (HCC)[place]/04/2017  ? Peripheral vascular disease (HCC)[place]? iliac artery clot  ? Peyronie's disease 06/12/2019  ? Pharyngoesophageal dysphagia 08/12/2018  ? Pneumonia 02/2015  ? Pneumothorax 06/07/2020  ? Preoperative clearance 04/03/2018  ? Squamous cell carcinoma of larynx (HCC)[place]/31/2018  ? Testicular pain, left 01/14/2019  ? Throat cancer (HCC)[place]? Tobacco use disorder 06/12/2019  ? ? ?Past Surgical History:  ?Procedure Laterality Date  ? ABDOMINAL ANGIOGRAM N/A 01/13/2015  ? Procedure: ABDOMINAL ANGIOGRAM;  Surgeon: ToddRosetta Posner;  [facility];  Service: Cardiovascular;  Laterality: N/A;  ? APPENDECTOMY    ? BACK SURGERY    ? CARDIAC CATHETERIZATION  2000/2012  ? with stents in 2000  ? CHOLECYSTECTOMY    ? COLONOSCOPY    ? ELBOW SURGERY    ? bilateral  ? ESOPHAGOGASTRODUODENOSCOPY    ? KNEE SURGERY Left   ? MICROLARYNGOSCOPY WITH CO2 LASER AND EXCISION OF VOCAL CORD LESION N/A 11/23/2016  ? Procedure: MICROLARYNGOSCOPY  AND EXCISION OF VOCAL CORD LESION;  Surgeon: JohnMelissa Montane;  [facility];  Laterality: N/A;  ? MICROLARYNGOSCOPY WITH CO2 LASER AND EXCISION OF VOCAL CORD LESION N/A 01/11/2017  ? Procedure: MICROLARYNGOSCOPY WITH CO2 LASER AND EXCISION OF VOCAL CORD LESION;  Surgeon: JohnMelissa Montane;  [facility];  Laterality: N/A;  ? MICROLARYNGOSCOPY WITH LASER N/A 03/16/2015  ? Procedure: MICROLARYNGOSCOPY ;  Surgeon: JohnMelissa Montane;  Location: [facility] [place]: [place];  Laterality: N/A;  ? peyronie's surgery    ? SHOULDER SURGERY Right   ? rotator cuff  ? SPINE SURGERY  09/2020  ? cervical surgery. steel plate, bone spurs, allograft.  ? THROAT SURGERY  1997  ? cancer removed  ? ? ?Family  History  ?Problem Relation Age of Onset  ? Cancer Mother   ?     bone  ? Hypertension Mother   ? Stroke Father   ? Hypertension Father   ? Healthy Child   ? Cancer Other   ?     brain  ? Diabetes Other   ? ? ?Social History  ? ?Socioeconomic History  ? Marital status: Married  ?  Spouse name: Not on file  ? Number of children: 4  ? Years of education: Not on file  ? Highest education level: GED or equivalent  ?Occupational History  ? Occupation: retired  ?Tobacco Use  ? Smoking status: Every Day  ?  Packs/day: 0.50  ?  Years: 50.00  ?  Pack years: 25.00  ?  Types: Cigarettes  ? Smokeless tobacco: Never  ? Tobacco comments:  ?  down to .5 ppd  ?Vaping Use  ? Vaping Use: Former  ?Substance and Sexual Activity  ? Alcohol use: No  ?  Alcohol/week: 0.0 standard drinks  ? Drug use: No  ? Sexual activity: Not on file  ?Other Topics Concern  ? Not on file  ?Social History Narrative  ? Lives with wife   ?   ? One level  ?   ? Right hand  ? ?Social Determinants of Health  ? ?Financial Resource Strain: Not on file  ?Food Insecurity: Not on file  ?Transportation Needs: Not on file  ?Physical Activity: Not on file  ?Stress: Not on file  ?Social Connections: Not on file  ?Intimate Partner Violence: Not on file  ? ? ?Outpatient Medications Prior to Visit  ?Medication Sig Dispense Refill  ? ALPRAZolam (XANAX) 0.25 MG tablet TAKE 1 TABLET BY MOUTH ONCE DAILY AS NEEDED FOR ANXIETY 30 tablet 0  ? amLODipine (NORVASC) 5 MG tablet TAKE 1 TABLET BY MOUTH ONCE DAILY . APPOINTMENT REQUIRED FOR FUTURE REFILLS 30 tablet 0  ? aspirin 81 MG tablet Take 162 mg by mouth daily.     ? clopidogrel (PLAVIX) 75 MG tablet Take 1 tablet by mouth once daily 90 tablet 2  ? cyclobenzaprine (FLEXERIL) 10 MG tablet TAKE 1 TABLET BY MOUTH THREE TIMES DAILY AS NEEDED FOR  BACK  SPASMS 90 tablet 0  ? DULoxetine (CYMBALTA) 60 MG capsule Take 1 capsule by mouth twice daily 180 capsule 1  ? finasteride (PROSCAR) 5 MG tablet Take 1 tablet by mouth once daily 90  tablet 1  ? gabapentin (NEURONTIN) 400 MG capsule TAKE 1 CAPSULE BY MOUTH THREE TIMES DAILY 90 capsule 2  ? HYDROcodone-acetaminophen (NORCO/VICODIN) 5-325 MG tablet Take 1 tablet by mouth every 8 (eight) hours

## 2022-03-16 ENCOUNTER — Telehealth: Payer: Self-pay

## 2022-03-16 DIAGNOSIS — J9610 Chronic respiratory failure, unspecified whether with hypoxia or hypercapnia: Secondary | ICD-10-CM | POA: Diagnosis not present

## 2022-03-16 DIAGNOSIS — J449 Chronic obstructive pulmonary disease, unspecified: Secondary | ICD-10-CM | POA: Diagnosis not present

## 2022-03-16 NOTE — Telephone Encounter (Signed)
[name] informed. She VU of pulse/oxygen and she will take him to ED if worsens. Pain has eased for now.  ? ?Don [name] 03/16/22 5:01 PM ? ?

## 2022-03-16 NOTE — Telephone Encounter (Signed)
Anne Ng is concerned due to patient's heart rate. This morning he got up from bed around 3 AM, he was short of breath and requesting his oxygen. His oxygen saturation was 95% (RA) but heart rate was 101 until he laid down. Pulse then rested at 80 once laid down. Questioned should this be a concern? She did not know this complaint yesterday when he was in office, however he states to her it has happened a few times previously.  ?Harrell Lark 03/16/22 2:56 PM ? ?

## 2022-03-17 ENCOUNTER — Telehealth: Payer: Self-pay

## 2022-03-17 ENCOUNTER — Other Ambulatory Visit: Payer: Self-pay

## 2022-03-17 DIAGNOSIS — R10814 Left lower quadrant abdominal tenderness: Secondary | ICD-10-CM

## 2022-03-17 NOTE — Telephone Encounter (Signed)
Anne Ng called to report that Don [name] is not improving.  She was told to call back to get CT scheduled.  Dr. Tobie Poet advised STAT CT abdomen and pelvis.  Call back number [phone].   ?

## 2022-03-27 ENCOUNTER — Other Ambulatory Visit: Payer: Self-pay | Admitting: Family Medicine

## 2022-03-27 DIAGNOSIS — R10814 Left lower quadrant abdominal tenderness: Secondary | ICD-10-CM

## 2022-03-27 MED ORDER — CYCLOBENZAPRINE HCL 10 MG PO TABS: ORAL_TABLET | ORAL | 2 refills | Status: DC

## 2022-03-27 NOTE — Telephone Encounter (Signed)
Rx duplicate ?

## 2022-04-04 ENCOUNTER — Telehealth: Payer: Medicare Other

## 2022-04-06 ENCOUNTER — Ambulatory Visit: Payer: Medicare Other | Admitting: Cardiology

## 2022-04-06 ENCOUNTER — Other Ambulatory Visit: Payer: Self-pay | Admitting: Family Medicine

## 2022-04-08 ENCOUNTER — Other Ambulatory Visit: Payer: Self-pay | Admitting: Family Medicine

## 2022-04-11 ENCOUNTER — Other Ambulatory Visit: Payer: Self-pay | Admitting: Family Medicine

## 2022-04-11 MED ORDER — HYDROCODONE-ACETAMINOPHEN 5-325 MG PO TABS: 1.0000 | ORAL_TABLET | Freq: Three times a day (TID) | ORAL | 0 refills | Status: DC | PRN

## 2022-04-16 DIAGNOSIS — J449 Chronic obstructive pulmonary disease, unspecified: Secondary | ICD-10-CM | POA: Diagnosis not present

## 2022-04-16 DIAGNOSIS — J9610 Chronic respiratory failure, unspecified whether with hypoxia or hypercapnia: Secondary | ICD-10-CM | POA: Diagnosis not present

## 2022-04-26 ENCOUNTER — Ambulatory Visit: Payer: Medicare Other | Admitting: Family Medicine

## 2022-04-27 ENCOUNTER — Other Ambulatory Visit: Payer: Self-pay | Admitting: Family Medicine

## 2022-04-30 ENCOUNTER — Other Ambulatory Visit: Payer: Self-pay | Admitting: Family Medicine

## 2022-05-02 ENCOUNTER — Other Ambulatory Visit: Payer: Self-pay | Admitting: Family Medicine

## 2022-05-02 ENCOUNTER — Telehealth: Payer: Self-pay

## 2022-05-02 MED ORDER — IPRATROPIUM-ALBUTEROL 0.5-2.5 (3) MG/3ML IN SOLN: RESPIRATORY_TRACT | 1 refills | Status: DC

## 2022-05-02 NOTE — Telephone Encounter (Signed)
Patient's wife called stating that the patient has ran out of refills on his duoneb, and is needing refills sent to the [employer]. She states it needs to be sent in as 4 times daily because she states he is using it up to 4 times daily due to his COPD being worse.

## 2022-05-08 ENCOUNTER — Other Ambulatory Visit: Payer: Self-pay | Admitting: Legal Medicine

## 2022-05-11 ENCOUNTER — Other Ambulatory Visit: Payer: Self-pay | Admitting: Physician Assistant

## 2022-05-12 ENCOUNTER — Telehealth: Payer: Self-pay | Admitting: Family Medicine

## 2022-05-12 ENCOUNTER — Ambulatory Visit (INDEPENDENT_AMBULATORY_CARE_PROVIDER_SITE_OTHER): Payer: Medicare Other | Admitting: Family Medicine

## 2022-05-12 VITALS — BP 124/60 | HR 60 | Temp 96.4°F | Resp 20 | Ht 69.5 in | Wt 198.0 lb

## 2022-05-12 DIAGNOSIS — J9611 Chronic respiratory failure with hypoxia: Secondary | ICD-10-CM | POA: Diagnosis not present

## 2022-05-12 DIAGNOSIS — G8929 Other chronic pain: Secondary | ICD-10-CM

## 2022-05-12 DIAGNOSIS — R61 Generalized hyperhidrosis: Secondary | ICD-10-CM | POA: Diagnosis not present

## 2022-05-12 DIAGNOSIS — J441 Chronic obstructive pulmonary disease with (acute) exacerbation: Secondary | ICD-10-CM | POA: Diagnosis not present

## 2022-05-12 DIAGNOSIS — E1142 Type 2 diabetes mellitus with diabetic polyneuropathy: Secondary | ICD-10-CM

## 2022-05-12 DIAGNOSIS — M542 Cervicalgia: Secondary | ICD-10-CM

## 2022-05-12 DIAGNOSIS — R1312 Dysphagia, oropharyngeal phase: Secondary | ICD-10-CM | POA: Diagnosis not present

## 2022-05-12 DIAGNOSIS — R053 Chronic cough: Secondary | ICD-10-CM

## 2022-05-12 MED ORDER — VENTOLIN HFA 108 (90 BASE) MCG/ACT IN AERS: 2.0000 | INHALATION_SPRAY | Freq: Four times a day (QID) | RESPIRATORY_TRACT | 3 refills | Status: DC | PRN

## 2022-05-12 MED ORDER — SPIRIVA RESPIMAT 2.5 MCG/ACT IN AERS: 2.0000 | INHALATION_SPRAY | Freq: Every day | RESPIRATORY_TRACT | 2 refills | Status: DC

## 2022-05-12 MED ORDER — FLUTICASONE FUROATE-VILANTEROL 100-25 MCG/ACT IN AEPB: INHALATION_SPRAY | RESPIRATORY_TRACT | 2 refills | Status: DC

## 2022-05-12 MED ORDER — HYDROCODONE-ACETAMINOPHEN 5-325 MG PO TABS: 1.0000 | ORAL_TABLET | Freq: Three times a day (TID) | ORAL | 0 refills | Status: DC | PRN

## 2022-05-12 NOTE — Progress Notes (Signed)
  Chronic Care Management   Outreach Note  05/12/2022 Name: NIMA KEMPPAINEN MRN: [id number] DOB: 04/21/1951  Referred by: [name], MD Reason for referral : No chief complaint on file.   An unsuccessful telephone outreach was attempted today. The patient was referred to the pharmacist for assistance with care management and care coordination.   Follow Up Plan:   Tatjana Dellinger Upstream Scheduler

## 2022-05-12 NOTE — Progress Notes (Signed)
Subjective:  Patient ID: Don [name], male    DOB: 05/15/1951  Age: 71 y.o. MRN: [id number]  Chief Complaint  Patient presents with  . COPD    HPI Don [name] comes in with complaints that his shortness of breath is worsening and he is having to wear his oxygen more.  He also continues to have difficulty swallowing and is choking easily.  Oxygen saturation 90-94%. Stopped Breo and spiriva due to respiratory therapist told him to stop them as he his inspiratory volume is very low.    Current Outpatient Medications on File Prior to Visit  Medication Sig Dispense Refill  . ALPRAZolam (XANAX) 0.25 MG tablet TAKE 1 TABLET BY MOUTH ONCE DAILY AS NEEDED FOR ANXIETY 30 tablet 1  . amLODipine (NORVASC) 5 MG tablet TAKE 1 TABLET BY MOUTH ONCE DAILY . APPOINTMENT REQUIRED FOR FUTURE REFILLS 30 tablet 0  . aspirin 81 MG tablet Take 162 mg by mouth daily.     Marland Kitchen BREO ELLIPTA 100-25 MCG/INH AEPB INHALE 1 PUFF BY MOUTH ONCE DAILY 60 each 2  . clopidogrel (PLAVIX) 75 MG tablet Take 1 tablet by mouth once daily 90 tablet 2  . cyclobenzaprine (FLEXERIL) 10 MG tablet One three times a day as needed for muscle spasm. 90 tablet 2  . DULoxetine (CYMBALTA) 60 MG capsule Take 1 capsule by mouth twice daily 180 capsule 1  . finasteride (PROSCAR) 5 MG tablet Take 1 tablet by mouth once daily 90 tablet 1  . gabapentin (NEURONTIN) 400 MG capsule TAKE 1 CAPSULE BY MOUTH THREE TIMES DAILY 90 capsule 0  . HYDROcodone-acetaminophen (NORCO/VICODIN) 5-325 MG tablet Take 1 tablet by mouth every 8 (eight) hours as needed for moderate pain. 90 tablet 0  . ipratropium-albuterol (DUONEB) 0.5-2.5 (3) MG/3ML SOLN USE 1 AMPULE IN NEBULIZER 4 TIMES DAILY 360 mL 1  . Lancets (ONETOUCH DELICA PLUS UMPNTI14E) MISC USE 1  TO CHECK GLUCOSE THREE TIMES DAILY 100 each 0  . metFORMIN (GLUCOPHAGE) 500 MG tablet Take 1 tablet by mouth twice daily 180 tablet 0  . metoprolol tartrate (LOPRESSOR) 50 MG tablet Take 1 tablet by mouth twice  daily 180 tablet 0  . MYRBETRIQ 25 MG TB24 tablet Take 1 tablet by mouth once daily 30 tablet 0  . nitroGLYCERIN (NITROSTAT) 0.4 MG SL tablet DISSOLVE ONE TABLET UNDER THE TONGUE EVERY 5-10 MINUTES PRIOR TO ACTIVITIES WHICH MIGHT PERCIPITATE AN ATTTACK. 25 tablet 0  . nystatin cream (MYCOSTATIN) Apply 1 application topically 2 (two) times daily as needed for dry skin.    . Omega-3 Fatty Acids (FISH OIL) 1000 MG CAPS Take 2 capsules (2,000 mg total) by mouth in the morning and at bedtime. 180 capsule 12  . omeprazole (PRILOSEC) 20 MG capsule Take 1 capsule by mouth once daily 90 capsule 0  . ONETOUCH ULTRA test strip USE 1 STRIP TO CHECK GLUCOSE TWICE DAILY 100 each 0  . rosuvastatin (CRESTOR) 40 MG tablet Take 1 tablet by mouth once daily 90 tablet 1  . SPIRIVA RESPIMAT 2.5 MCG/ACT AERS Inhale 2 puffs into the lungs daily. 4 g 2  . tamsulosin (FLOMAX) 0.4 MG CAPS capsule TAKE 2 CAPSULES BY MOUTH ONCE DAILY AFTER SUPPER 180 capsule 0  . TOUJEO SOLOSTAR 300 UNIT/ML Solostar Pen INJECT 30 UNITS SUBCUTANEOUSLY ONCE DAILY 6 mL 3  . traZODone (DESYREL) 150 MG tablet Take 1 tablet by mouth once daily 90 tablet 0  . VENTOLIN HFA 108 (90 Base) MCG/ACT inhaler Inhale 2 puffs into the  lungs as needed for shortness of breath or wheezing.  0   No current facility-administered medications on file prior to visit.   Past Medical History:  Diagnosis Date  . Absence of bladder continence 01/08/2022  . Acute bilateral low back pain without sciatica 01/08/2022  . Anxiety state 02/02/2022  . Arthritis   . Asymptomatic LV dysfunction 10/18/2017  . B12 deficiency anemia 08/25/2021  . Basal cell carcinoma   . Benign prostatic hyperplasia with incomplete bladder emptying 01/14/2019  . Blood in ear canal, left 05/13/2021  . Bone pain 03/08/2020  . Cancer ([place])    throat - 1997, throat - 2018  . Cardiomyopathy, secondary ([place]) 12/01/2016   Overview:  EF 47% 12/26/16  . Chest pain syndrome 11/02/2017  . Chronic  coronary artery disease 10/18/2017  . Chronic ischemic heart disease 11/13/1998   Jan 22, 2003 Entered By: Jani Files Comment:  massive Mi in 2000 per patient hsitory, 2nd Mi in 2001Mar 11, 2004 Entered By: Marland Kitchen J Comment: Had stent placedin 2000,cath/angio in 2001  . CKD (chronic kidney disease) stage 3, GFR 30-59 ml/min (HCC) 07/25/2019  . COPD (chronic obstructive pulmonary disease) ([place])   . Coronary artery disease   . Coronary artery disease involving native coronary artery of native heart with angina pectoris ([place]) 12/01/2016   Overview:  He has hx of IWMI in remote past, last cath in [facility] showed chronic total occlusion of previously stented RCA, with good collaterals, a 40% LAD stenosis, inferior hypokinesis and EF 45%.    He's been lost to Cardiology f/u since 2015, but has not had any recurrent events  . Deficiency anemia 08/25/2021  . Depression   . Diabetes mellitus ([place]) 03/08/2020  . Diverticulitis 05/02/2020  . Dyslipidemia 12/01/2016  . Emphysema lung ([place])   . Erectile dysfunction due to diseases classified elsewhere 06/12/2019  . Essential hypertension 10/18/2017  . GERD (gastroesophageal reflux disease)   . Hoarseness 11/15/2016  . Hyperlipidemia   . Hypertension   . Insomnia 01/28/2021  . Iron deficiency anemia due to chronic blood loss 08/25/2021  . Laceration of thumb, left 12/17/2015  . Lesion of vocal cord   . Leukoplakia of larynx 11/15/2016  . Long-term use of aspirin therapy 10/02/2018  . Malfunction of penile prosthesis ([place]) 01/14/2019  . Malignant neoplasm of skin 01/28/2021   Formatting of this note might be different from the original. Jan 22, 2003 Entered By: Marland Kitchen J Comment: of skin - removed x2 inpast Jan 22, 2003 Entered By: Jani Files Comment: of skin - removed x2 inpast  . Melanoma of back ([place])    melanoma on back  . Memory loss 03/08/2020  . Mood disorder in conditions classified elsewhere 02/02/2022  . Myalgia  03/08/2020  . Myocardial infarction ([place]) 2000   2 stents  . Other ill-defined and unknown causes of morbidity and mortality 11/13/1993   Formatting of this note might be different from the original. Jan 31, 2008 Entered By: ARMOUR,ROSS B Comment: lumbar, 8/08 Jan 22, 2003 Entered By: Marland Kitchen J Comment: x59yr in work place  quit 345yrago in 2000 Jan 22, 2003 Entered By: MAMarland Kitchen Comment: [id number] work place  quit [id number] in 2000 Jan 31, 2008 Entered By: ARMOStephannie Petersomment: lumbar, 8/08  . PAD (peripheral artery disease) (HCC)Dante/04/2017  . Peripheral vascular disease (HCC)    iliac artery clot  . Peyronie's disease 06/12/2019  . Pharyngoesophageal dysphagia 08/12/2018  . Pneumonia 02/2015  . Pneumothorax 06/07/2020  .  Preoperative clearance 04/03/2018  . Squamous cell carcinoma of larynx ([place]) 12/13/2016  . Testicular pain, left 01/14/2019  . Throat cancer ([place])   . Tobacco use disorder 06/12/2019   Past Surgical History:  Procedure Laterality Date  . ABDOMINAL ANGIOGRAM N/A 01/13/2015   Procedure: ABDOMINAL ANGIOGRAM;  Surgeon: Rosetta Posner, MD;  Location: [facility] CATH LAB;  Service: Cardiovascular;  Laterality: N/A;  . APPENDECTOMY    . BACK SURGERY    . CARDIAC CATHETERIZATION  2000/2012   with stents in 2000  . CHOLECYSTECTOMY    . COLONOSCOPY    . ELBOW SURGERY     bilateral  . ESOPHAGOGASTRODUODENOSCOPY    . KNEE SURGERY Left   . MICROLARYNGOSCOPY WITH CO2 LASER AND EXCISION OF VOCAL CORD LESION N/A 11/23/2016   Procedure: MICROLARYNGOSCOPY  AND EXCISION OF VOCAL CORD LESION;  Surgeon: Melissa Montane, MD;  Location: [place];  Service: ENT;  Laterality: N/A;  . MICROLARYNGOSCOPY WITH CO2 LASER AND EXCISION OF VOCAL CORD LESION N/A 01/11/2017   Procedure: MICROLARYNGOSCOPY WITH CO2 LASER AND EXCISION OF VOCAL CORD LESION;  Surgeon: Melissa Montane, MD;  Location: [place];  Service: ENT;  Laterality: N/A;  . MICROLARYNGOSCOPY WITH LASER N/A 03/16/2015   Procedure: MICROLARYNGOSCOPY ;   Surgeon: Melissa Montane, MD;  Location: [place];  Service: ENT;  Laterality: N/A;  . peyronie's surgery    . SHOULDER SURGERY Right    rotator cuff  . SPINE SURGERY  09/2020   cervical surgery. steel plate, bone spurs, allograft.  . THROAT SURGERY  1997   cancer removed    Family History  Problem Relation Age of Onset  . Cancer Mother        bone  . Hypertension Mother   . Stroke Father   . Hypertension Father   . Healthy Child   . Cancer Other        brain  . Diabetes Other    Social History   Socioeconomic History  . Marital status: Married    Spouse name: Not on file  . Number of children: 4  . Years of education: Not on file  . Highest education level: GED or equivalent  Occupational History  . Occupation: retired  Tobacco Use  . Smoking status: Every Day    Packs/day: 0.50    Years: 50.00    Total pack years: 25.00    Types: Cigarettes  . Smokeless tobacco: Never  . Tobacco comments:    down to .5 ppd  Vaping Use  . Vaping Use: Former  Substance and Sexual Activity  . Alcohol use: No    Alcohol/week: 0.0 standard drinks of alcohol  . Drug use: No  . Sexual activity: Not on file  Other Topics Concern  . Not on file  Social History Narrative   Lives with wife       One level      Right hand   Social Determinants of Health   Financial Resource Strain: Not on file  Food Insecurity: Not on file  Transportation Needs: Not on file  Physical Activity: Not on file  Stress: Not on file  Social Connections: Not on file    Review of Systems  Constitutional:  Negative for chills and fever.  HENT:  Negative for congestion, rhinorrhea and sore throat.   Respiratory:  Positive for cough and shortness of breath.   Cardiovascular:  Negative for chest pain and palpitations.  Gastrointestinal:  Negative for abdominal pain, constipation, diarrhea, nausea and vomiting.  Difficulty swallowing   Genitourinary:  Negative for dysuria and urgency.  Musculoskeletal:   Positive for back pain. Negative for arthralgias and myalgias.  Neurological:  Positive for light-headedness. Negative for dizziness and headaches.  Psychiatric/Behavioral:  Negative for dysphoric mood. The patient is not nervous/anxious.      Objective:  BP 124/60   Pulse 60   Temp (!) 96.4 F (35.8 C)   Resp 20   Ht 5' 9.5" (1.765 m)   Wt 198 lb (89.8 kg)   BMI 28.82 kg/m      05/12/2022    9:34 AM 03/14/2022    8:38 AM 02/17/2022    8:15 AM  BP/Weight  Systolic BP 161 096 045  Diastolic BP 60 72 66  Wt. (Lbs) 198 198 199  BMI 28.82 kg/m2 28.82 kg/m2 28.97 kg/m2    Physical Exam  Diabetic Foot Exam - Simple   No data filed      Lab Results  Component Value Date   WBC 12.0 (H) 03/14/2022   HGB 13.9 03/14/2022   HCT 41.4 03/14/2022   PLT 183 03/14/2022   GLUCOSE 355 (H) 03/14/2022   CHOL 129 12/16/2020   TRIG 311 (H) 12/16/2020   HDL 26 (L) 12/16/2020   LDLCALC 55 12/16/2020   ALT 11 03/14/2022   AST 7 03/14/2022   NA 137 03/14/2022   K 4.5 03/14/2022   CL 97 03/14/2022   CREATININE 0.85 03/14/2022   BUN 10 03/14/2022   CO2 27 03/14/2022   TSH 2.630 12/16/2020   INR 1.05 03/16/2015   HGBA1C 6.8 (H) 12/16/2020   MICROALBUR 30 05/23/2021      Assessment & Plan:   Problem List Items Addressed This Visit   None .  No orders of the defined types were placed in this encounter.   No orders of the defined types were placed in this encounter.    Follow-up: No follow-ups on file.  An After Visit Summary was printed and given to the patient.  [name], MD [facility] [phone]

## 2022-05-15 ENCOUNTER — Telehealth: Payer: Self-pay | Admitting: Family Medicine

## 2022-05-15 ENCOUNTER — Telehealth: Payer: Self-pay

## 2022-05-15 NOTE — Progress Notes (Signed)
Chronic Care Management Pharmacy Assistant   Name: Don [name]  MRN: [id number] DOB: November 22, 1950   Reason for Encounter: Chart Prep for initial visit with CPP  Recent office visits:  05/12/22 Blane Ohara MD. Seen for COPD.Referral to Speech Therapy and [facility] Coordination. No med changes.  03/14/22 Blane Ohara MD. Seen for abdominal pain. Started on Ciprofloxacin HCI 500mg  and Metronidazole 500mg .   02/17/22 Brent Bulla MD. Seen for Covid. Started on Levofloxacin 500mg  and Paxlovid.  01/24/22 Blane Ohara MD. Orders Only. D/C Gemtesa 75mg  and Ordered Myrbetriq 25mg .   01/05/22 Blane Ohara MD. Seen for back pain. Trial of gemtesa 75 mg daily.   12/30/21 Brent Bulla MD. Seen for COPD. Started on Levofloxacin 500mg  and Prednisone 10mg .   Recent consult visits:  12/15/21 (Oncology) Don Pray MD. Seen for Squamous Cell Carcinoma. No med changes.   Hospital visits:  None in previous 6 months  Medications: Outpatient Encounter Medications as of 05/15/2022  Medication Sig   ALPRAZolam (XANAX) 0.25 MG tablet TAKE 1 TABLET BY MOUTH ONCE DAILY AS NEEDED FOR ANXIETY   amLODipine (NORVASC) 5 MG tablet TAKE 1 TABLET BY MOUTH ONCE DAILY . APPOINTMENT REQUIRED FOR FUTURE REFILLS   aspirin 81 MG tablet Take 162 mg by mouth daily.    clopidogrel (PLAVIX) 75 MG tablet Take 1 tablet by mouth once daily   cyclobenzaprine (FLEXERIL) 10 MG tablet One three times a day as needed for muscle spasm.   DULoxetine (CYMBALTA) 60 MG capsule Take 1 capsule by mouth twice daily   finasteride (PROSCAR) 5 MG tablet Take 1 tablet by mouth once daily   fluticasone furoate-vilanterol (BREO ELLIPTA) 100-25 MCG/ACT AEPB INHALE 1 PUFF BY MOUTH ONCE DAILY   gabapentin (NEURONTIN) 400 MG capsule TAKE 1 CAPSULE BY MOUTH THREE TIMES DAILY   HYDROcodone-acetaminophen (NORCO/VICODIN) 5-325 MG tablet Take 1 tablet by mouth every 8 (eight) hours as needed for moderate pain.   ipratropium-albuterol  (DUONEB) 0.5-2.5 (3) MG/3ML SOLN USE 1 AMPULE IN NEBULIZER 4 TIMES DAILY   Lancets (ONETOUCH DELICA PLUS LANCET33G) MISC USE 1  TO CHECK GLUCOSE THREE TIMES DAILY   metFORMIN (GLUCOPHAGE) 500 MG tablet Take 1 tablet by mouth twice daily   metoprolol tartrate (LOPRESSOR) 50 MG tablet Take 1 tablet by mouth twice daily   MYRBETRIQ 25 MG TB24 tablet Take 1 tablet by mouth once daily   nitroGLYCERIN (NITROSTAT) 0.4 MG SL tablet DISSOLVE ONE TABLET UNDER THE TONGUE EVERY 5-10 MINUTES PRIOR TO ACTIVITIES WHICH MIGHT PERCIPITATE AN ATTTACK.   nystatin cream (MYCOSTATIN) Apply 1 application topically 2 (two) times daily as needed for dry skin.   Omega-3 Fatty Acids (FISH OIL) 1000 MG CAPS Take 2 capsules (2,000 mg total) by mouth in the morning and at bedtime.   omeprazole (PRILOSEC) 20 MG capsule Take 1 capsule by mouth once daily   ONETOUCH ULTRA test strip USE 1 STRIP TO CHECK GLUCOSE TWICE DAILY   rosuvastatin (CRESTOR) 40 MG tablet Take 1 tablet by mouth once daily   SPIRIVA RESPIMAT 2.5 MCG/ACT AERS Inhale 2 puffs into the lungs daily.   tamsulosin (FLOMAX) 0.4 MG CAPS capsule TAKE 2 CAPSULES BY MOUTH ONCE DAILY AFTER SUPPER   TOUJEO SOLOSTAR 300 UNIT/ML Solostar Pen INJECT 30 UNITS SUBCUTANEOUSLY ONCE DAILY   traZODone (DESYREL) 150 MG tablet Take 1 tablet by mouth once daily   VENTOLIN HFA 108 (90 Base) MCG/ACT inhaler Inhale 2 puffs into the lungs every 6 (six) hours as needed for shortness  of breath or wheezing.   No facility-administered encounter medications on file as of 05/15/2022.    Lab Results  Component Value Date/Time   HGBA1C 6.8 (H) 12/16/2020 09:13 AM   MICROALBUR 30 05/23/2021 09:39 AM   MICROALBUR 80 08/23/2020 02:39 PM     BP Readings from Last 3 Encounters:  05/12/22 124/60  03/14/22 (!) 148/72  02/17/22 130/66      Have you seen any other providers since your last visit with PCP?   What is your top health concern to discuss at your upcoming visit?   Do you have  any problems getting your medications from the pharmacy?   Financial barriers?  Access barriers?    If yes, please explain:    Unable to get a hold of pt to complete this call    Don [name] was reminded to have all medications, supplements and any blood glucose and/or blood pressure readings available for review with [name], Pharm. D, at his telephone visit on 05/17/22 at 12:30pm .    Star Rating Drugs:  Medication:  Last Fill: Day Supply Metformin   05/07/22-02/07/22 90ds Rosuvastatin   04/27/22-02/06/22 90ds   Care Gaps: Last Annual Wellness visit?None noted  Last Colonoscopy?10/19/21 Last Bone Denisty? Never done Last eye exam / retinopathy screening?Never done Last diabetic foot exam?Never done  Immunizations:  Pneumonia 10/01/15 Covid 10/12/20 Influenza 09/01/21   Don [name], CMA Clinical Pharmacist Assistant  [phone]

## 2022-05-15 NOTE — Progress Notes (Signed)
  Chronic Care Management   Note  05/15/2022 Name: [name] MRN: [id number] DOB: 27-Jan-1951  Don [name] is a 71 y.o. year old male who is a primary care patient of Cox, Kirsten, MD. I reached out to [employer] by phone today in response to a referral sent by Don [name]'s PCP, Cox, Kirsten, MD.   Don [name] was given information about Chronic Care Management services today including:  CCM service includes personalized support from designated clinical staff supervised by his physician, including individualized plan of care and coordination with other care providers 24/7 contact phone numbers for assistance for urgent and routine care needs. Service will only be billed when office clinical staff spend 20 minutes or more in a month to coordinate care. Only one practitioner may furnish and bill the service in a calendar month. The patient may stop CCM services at any time (effective at the end of the month) by phone call to the office staff.   [name]/SPOUSE verbally agreed to assistance and services provided by embedded care coordination/care management team today.  Follow up plan:REFERRAL/ NO COPAY   Tatjana Dellinger [employer] Scheduler

## 2022-05-16 ENCOUNTER — Encounter: Payer: Self-pay | Admitting: Family Medicine

## 2022-05-16 DIAGNOSIS — E1142 Type 2 diabetes mellitus with diabetic polyneuropathy: Secondary | ICD-10-CM

## 2022-05-16 DIAGNOSIS — R61 Generalized hyperhidrosis: Secondary | ICD-10-CM

## 2022-05-16 DIAGNOSIS — J449 Chronic obstructive pulmonary disease, unspecified: Secondary | ICD-10-CM | POA: Diagnosis not present

## 2022-05-16 DIAGNOSIS — R053 Chronic cough: Secondary | ICD-10-CM

## 2022-05-16 DIAGNOSIS — G8929 Other chronic pain: Secondary | ICD-10-CM

## 2022-05-16 DIAGNOSIS — J441 Chronic obstructive pulmonary disease with (acute) exacerbation: Secondary | ICD-10-CM | POA: Insufficient documentation

## 2022-05-16 DIAGNOSIS — J9611 Chronic respiratory failure with hypoxia: Secondary | ICD-10-CM | POA: Insufficient documentation

## 2022-05-16 DIAGNOSIS — J9610 Chronic respiratory failure, unspecified whether with hypoxia or hypercapnia: Secondary | ICD-10-CM | POA: Diagnosis not present

## 2022-05-16 HISTORY — DX: Chronic cough: R05.3

## 2022-05-16 HISTORY — DX: Other chronic pain: G89.29

## 2022-05-16 HISTORY — DX: Generalized hyperhidrosis: R61

## 2022-05-16 HISTORY — DX: Type 2 diabetes mellitus with diabetic polyneuropathy: E11.42

## 2022-05-16 HISTORY — DX: Chronic respiratory failure with hypoxia: J96.11

## 2022-05-16 HISTORY — DX: Chronic obstructive pulmonary disease with (acute) exacerbation: J44.1

## 2022-05-16 NOTE — Assessment & Plan Note (Signed)
Control: good Recommend check sugars fasting daily. Recommend check feet daily. Recommend annual eye exams. Medicines: Continue metformin and gabapentin. Continue to work on eating a healthy diet and exercise.  Labs drawn today.

## 2022-05-16 NOTE — Assessment & Plan Note (Signed)
Restart Spiriva, Breo and albuterol. Check CT scan of chest.

## 2022-05-16 NOTE — Assessment & Plan Note (Signed)
Continue oxygen as prescribed.  Mainly uses at night.

## 2022-05-16 NOTE — Assessment & Plan Note (Signed)
Worsening. Check CT scan of chest.

## 2022-05-16 NOTE — Assessment & Plan Note (Signed)
Unclear etiology

## 2022-05-16 NOTE — Assessment & Plan Note (Signed)
Prescription for hydrocodone refilled

## 2022-05-17 ENCOUNTER — Ambulatory Visit (INDEPENDENT_AMBULATORY_CARE_PROVIDER_SITE_OTHER): Payer: Medicare Other

## 2022-05-17 ENCOUNTER — Encounter: Payer: Self-pay | Admitting: Family Medicine

## 2022-05-17 DIAGNOSIS — I10 Essential (primary) hypertension: Secondary | ICD-10-CM

## 2022-05-17 DIAGNOSIS — E782 Mixed hyperlipidemia: Secondary | ICD-10-CM

## 2022-05-17 DIAGNOSIS — E1142 Type 2 diabetes mellitus with diabetic polyneuropathy: Secondary | ICD-10-CM

## 2022-05-17 NOTE — Patient Instructions (Signed)
Visit Information   Goals Addressed   None    Patient Care Plan: CCM Pharmacy Care Plan     Problem Identified: Disease State Management   Priority: High  Onset Date: 05/17/2022     Long-Range Goal: Patient-Specific Goal   Start Date: 05/17/2022  Expected End Date: 05/17/2023  This Visit's Progress: On track  Priority: High  Note:   Current Barriers:  Does not contact provider office for questions/concerns  Pharmacist Clinical Goal(s):  Patient will contact provider office for questions/concerns as evidenced notation of same in electronic health record through collaboration with PharmD and provider.   Interventions: 1:1 collaboration with [name], MD regarding development and update of comprehensive plan of care as evidenced by provider attestation and co-signature Inter-disciplinary care team collaboration (see longitudinal plan of care) Comprehensive medication review performed; medication list updated in electronic medical record  Hypertension (BP goal <130/80) BP Readings from Last 3 Encounters:  05/12/22 124/60  03/14/22 (!) 148/72  02/17/22 130/66  -Not ideally controlled -Current treatment: Metoprolol Tart 50mg  BID Appropriate, Effective, Safe, Accessible -Medications previously tried: Amlodipine (Patient stated provider Dc'd)  -Current home readings:   July 2023: Normally around 106/60. Didn't have readings on them -Current dietary habits: "Tries to eat healthy" -Current exercise habits: None -Reports hypotensive/hypertensive symptoms -Educated on BP goals and benefits of medications for prevention of heart attack, stroke and kidney damage; -Counseled to monitor BP at home daily, document, and provide log at future appointments July 2023; Patient experiencing sweating and dizziness. His BP is typically around 383 systolic per patient. Has f/u with Cardio Friday. Counseled them to bring BP logs to appt  CAD: (LDL goal < 70) The ASCVD Risk score (Arnett DK, et  al., 2019) failed to calculate for the following reasons:   The patient has a prior MI or stroke diagnosis Lab Results  Component Value Date   CHOL 129 12/16/2020   Lab Results  Component Value Date   HDL 26 (L) 12/16/2020   Lab Results  Component Value Date   LDLCALC 55 12/16/2020   Lab Results  Component Value Date   TRIG 311 (H) 12/16/2020   Lab Results  Component Value Date   CHOLHDL 5.0 12/16/2020  No results found for: "LDLDIRECT"  -Controlled -Current treatment: ASA 162mg  Appropriate, Effective, Safe, Accessible Rosuvastatin 40mg  Appropriate, Effective, Safe, Accessible Clopidogrel 75mg  Appropriate, Effective, Safe, Accessible -Medications previously tried: N/A  -Current dietary patterns: "Tries to eat healthy" -Current exercise habits: None -Educated on Cholesterol goals;  July 2023: Has f/u with Cardio Friday, will defer to specialist  Diabetes (A1c goal <8%) Lab Results  Component Value Date   HGBA1C 6.8 (H) 12/16/2020   Lab Results  Component Value Date   MICROALBUR 30 05/23/2021   LDLCALC 55 12/16/2020   CREATININE 0.85 03/14/2022   Lab Results  Component Value Date   NA 137 03/14/2022   K 4.5 03/14/2022   CREATININE 0.85 03/14/2022   EGFR 92 03/14/2022   GFRNONAA 87 12/16/2020   GLUCOSE 355 (H) 03/14/2022   Lab Results  Component Value Date   WBC 12.0 (H) 03/14/2022   HGB 13.9 03/14/2022   HCT 41.4 03/14/2022   MCV 84 03/14/2022   PLT 183 03/14/2022  -Controlled -Current medications: Metformin 500mg  BID Appropriate, Effective, Safe, Accessible Toujeo Appropriate, Effective, Safe, Accessible -Medications previously tried: N/A  -Current home glucose readings fasting glucose:  July 2023: 100-140 post prandial glucose: N/A -Denies hypoglycemic/hyperglycemic symptoms -Current exercise: N/A -Educated on A1c and blood  sugar goals; -Counseled to check feet daily and get yearly eye exams July 2023: Patient states he sweats often. They do  check his sugas and they are around 100-110, most likely not the source. Candidate for ACE/ARB  Depression/Anxiety (Goal: Decrease symptoms) -Controlled -Current treatment: Duloxetine 73m BID Appropriate, Effective, Safe, Accessible Alprazolam 0.273mPRN Query Appropriate,  -Medications previously tried/failed: N/A -PHQ9:     01/05/2022    3:45 PM 05/20/2021   10:08 AM 03/08/2020    3:08 PM  Depression screen PHQ 2/9  Decreased Interest 0 0 0  Down, Depressed, Hopeless 0 0 0  PHQ - 2 Score 0 0 0  -GAD7:      No data to display         -Educated on Benefits of medication for symptom control -Recommended to continue current medication. Counseled on risks with CS  BPH (Goal: improve urination) -IPSS Questionnaire (AUA-7): Over the past month.   1)  How often have you had a sensation of not emptying your bladder completely after you finish urinating?  Patient didn't understand the question, will write down peeing log and bring into f/u with PCP  2)  How often have you had to urinate again less than two hours after you finished urinating? Patient didn't understand the question, will write down peeing log and bring into f/u with PCP  3)  How often have you found you stopped and started again several times when you urinated?  Patient didn't understand the question, will write down peeing log and bring into f/u with PCP  4) How difficult have you found it to postpone urination?  Patient didn't understand the question, will write down peeing log and bring into f/u with PCP  5) How often have you had a weak urinary stream?  Patient didn't understand the question, will write down peeing log and bring into f/u with PCP  6) How often have you had to push or strain to begin urination?  Patient didn't understand the question, will write down peeing log and bring into f/u with PCP  7) How many times did you most typically get up to urinate from the time you went to bed until the time you got up in  the morning?  Patient didn't understand the question, will write down peeing log and bring into f/u with PCP  Total score:  0-7 mildly symptomatic   8-19 moderately symptomatic   20-35 severely symptomatic    -Not ideally controlled -Night time urination frequency: Patient didn't answer question -Current treatment: Tamsulosin 0.56m31mQD Appropriate, Effective, Safe, Accessible Finasteride 5mg59m Appropriate, Effective, Safe, Accessible Mybetriq 25mg356mry Appropriate,  -Medications previously tried: N/A -Counseled on non-pharmacologic techniques such as Kegels July 2023: Mybetriq could interfere with Pulm meds. But patient states he also suffers from constipation and I would prefer not to use an anticholinergic  Chronic Pain -Uncontrolled -Current treatment: Duloxetine 60mg 40mQuery Appropriate,  Hydrocodone/APAP Query Appropriate,  Cyclobenzaprine 10mg Q5m Appropriate,  -Medications previously tried: N/A  -Pain Scale There were no vitals filed for this visit.  Aggravating Factors: Chronic  Pain Type: Chronic  July 2023: Unable to do pain exam, spoke primarily with wife. Recommend Tizanidine due to being elderly. Both Duloxetine and Hydrocodone go through CYP2D6. Hydrocodone, a prodrug, is unable to metabolize to active metabolite. Recommend alternative treatment modality (Patient needs Duloxetine for mood and when he's tried to DC in past he "Hears trains")  Insomnia (Goal: 7-8 hours of sleep/night) -Uncontrolled -Patient currently getting  4 hours of sleep/night -Patient currently waking up Multiple times/night -Patient's concern is Both falling/staying alseep -Current treatment: Alprazolam 0.53m PRN Query Appropriate,  Trazodone 1570mQuery Appropriate,  -Medications previously tried: N/A -Counseled on sleep hygiene techniques (consistent sleep/wake up schedule, no electronic screens 1-2 hours before bed, etc) July 2023: Neither of these are preferred therapies. Patient  most likely is unable to sleep due to pain. If we an get his pain addressed, will assess sleep patterns when under control    Patient Goals/Self-Care Activities Patient will:  - take medications as prescribed as evidenced by patient report and record review  Follow Up Plan: The patient has been provided with contact information for the care management team and has been advised to call with any health related questions or concerns.   CPP F/U Sept 2023  [place] ConstablePhSherian Rein. - [phone]       Mr. StNeisas given information about Chronic Care Management services today including:  CCM service includes personalized support from designated clinical staff supervised by his physician, including individualized plan of care and coordination with other care providers 24/7 contact phone numbers for assistance for urgent and routine care needs. Standard insurance, coinsurance, copays and deductibles apply for chronic care management only during months in which we provide at least 20 minutes of these services. Most insurances cover these services at 100%, however patients may be responsible for any copay, coinsurance and/or deductible if applicable. This service may help you avoid the need for more expensive face-to-face services. Only one practitioner may furnish and bill the service in a calendar month. The patient may stop CCM services at any time (effective at the end of the month) by phone call to the office staff.  Patient agreed to services and verbal consent obtained.   The patient verbalized understanding of instructions, educational materials, and care plan provided today and DECLINED offer to receive copy of patient instructions, educational materials, and care plan.  The pharmacy team will reach out to the patient again over the next 60 days.   Na[place]ane HackerRPThree [place]

## 2022-05-17 NOTE — Progress Notes (Signed)
Chronic Care Management Pharmacy Note  05/17/2022 Name:  Don [name] MRN:  [id number] DOB:  [birth year]  Summary: -Pleasant 71 year old presents for initial CCM visit. His wife, Anne Ng, primarily manages medications. They've been married 75 years with 4 kids and several grandchildren  Recommendations/Changes made from today's visit: -Patient states he sweats often. They do check his sugas and they are around 100-110, most likely not the source. Will ask PCP to assess at f/u end of the month -Candidate for ACE/ARB -Mybetriq could interfere with Pulm meds. But patient states he also suffers from constipation so anticholinergic not recommended -Unable to do pain exam, spoke primarily with wife. Recommend Tizanidine due to being elderly vs Cyclobenzaprine -Both Duloxetine and Hydrocodone metabolized through CYP2D6. Hydrocodone, a prodrug, is unable to metabolize to active metabolite. Recommend alternative treatment modality (Patient needs Duloxetine for mood and when he's tried to DC in past he "Hears trains") -Patient experiencing sweating and dizziness. His BP is typically around 048 systolic per patient. Has f/u with Cardio Friday. Counseled them to bring BP logs to appt -Unable to conduct IPSS. Patient was confused but did state he goes a day without peeing sometimes. Recommend patient make urine log a few days before appt with PCP at end of the month to assess  Subjective: Don [name] is an 71 y.o. year old male who is a primary patient of Cox, Kirsten, MD.  The CCM team was consulted for assistance with disease management and care coordination needs.    Engaged with patient by telephone for initial visit in response to provider referral for pharmacy case management and/or care coordination services.   Consent to Services:  The patient was given the following information about Chronic Care Management services today, agreed to services, and gave verbal consent: 1. CCM service  includes personalized support from designated clinical staff supervised by the primary care provider, including individualized plan of care and coordination with other care providers 2. 24/7 contact phone numbers for assistance for urgent and routine care needs. 3. Service will only be billed when office clinical staff spend 20 minutes or more in a month to coordinate care. 4. Only one practitioner may furnish and bill the service in a calendar month. 5.The patient may stop CCM services at any time (effective at the end of the month) by phone call to the office staff. 6. The patient will be responsible for cost sharing (co-pay) of up to 20% of the service fee (after annual deductible is met). Patient agreed to services and consent obtained.  Patient Care Team: Don Brome, MD as PCP - General (Family Medicine) Richardo Priest, MD as Referring Physician (Cardiology) Melissa Montane, MD as Consulting Physician (Otolaryngology) Lane Hacker, [facility] as Pharmacist (Pharmacist)  Recent office visits:  05/12/22 Don Brome MD. Seen for COPD.Referral to Speech Therapy and [name] Coordination. No med changes.   03/14/22 Don Brome MD. Seen for abdominal pain. Started on Ciprofloxacin HCI 556m and Metronidazole 5029m    02/17/22 PeReinaldo MeekerD. Seen for Covid. Started on Levofloxacin 50037mnd Paxlovid.   01/24/22 CoxRochel [name]. Orders Only. D/C Gemtesa 42m47md Ordered Myrbetriq 25mg52m 01/05/22 Cox, Don BromeSeen for back pain. Trial of gemtesa 75 mg daily.    12/30/21 PerryReinaldo MeekerSeen for COPD. Started on Levofloxacin 500mg 58mPrednisone 10mg. 42mecent consult visits:  12/15/21 (Oncology) McCartyHosie Poissonen for Squamous Cell Carcinoma. No med changes.  Hospital visits:  None in previous 6 months   Objective:  Lab Results  Component Value Date   CREATININE 0.85 03/14/2022   BUN 10 03/14/2022   EGFR 92 03/14/2022   GFRNONAA 87 12/16/2020   GFRAA 100  12/16/2020   NA 137 03/14/2022   K 4.5 03/14/2022   CALCIUM 8.9 03/14/2022   CO2 27 03/14/2022   GLUCOSE 355 (H) 03/14/2022    Lab Results  Component Value Date/Time   HGBA1C 6.8 (H) 12/16/2020 09:13 AM   MICROALBUR 30 05/23/2021 09:39 AM   MICROALBUR 80 08/23/2020 02:39 PM    Last diabetic Eye exam: No results found for: "HMDIABEYEEXA"  Last diabetic Foot exam: No results found for: "HMDIABFOOTEX"   Lab Results  Component Value Date   CHOL 129 12/16/2020   HDL 26 (L) 12/16/2020   LDLCALC 55 12/16/2020   TRIG 311 (H) 12/16/2020   CHOLHDL 5.0 12/16/2020       Latest Ref Rng & Units 03/14/2022    9:43 AM 12/15/2021   12:00 AM 11/02/2021    2:00 PM  Hepatic Function  Total Protein 6.0 - 8.5 g/dL 6.1   6.5   Albumin 3.7 - 4.7 g/dL 4.0  3.7     3.9   AST 0 - 40 IU/L '7  21     18   ' ALT 0 - 44 IU/L '11  17     16   ' Alk Phosphatase 44 - 121 IU/L 88  85     98   Total Bilirubin 0.0 - 1.2 mg/dL 0.3   0.2      This result is from an external source.    Lab Results  Component Value Date/Time   TSH 2.630 12/16/2020 09:13 AM   TSH 2.060 03/18/2020 10:27 AM       Latest Ref Rng & Units 03/14/2022    9:43 AM 12/15/2021   12:00 AM 11/02/2021    2:00 PM  CBC  WBC 3.4 - 10.8 x10E3/uL 12.0  7.4     10.0   Hemoglobin 13.0 - 17.7 g/dL 13.9  13.4     12.9   Hematocrit 37.5 - 51.0 % 41.4  40     39.3   Platelets 150 - 450 x10E3/uL 183  206     332      This result is from an external source.    No results found for: "VD25OH"  Clinical ASCVD: Yes  The ASCVD Risk score (Arnett DK, et al., 2019) failed to calculate for the following reasons:   The patient has a prior MI or stroke diagnosis       01/05/2022    3:45 PM 05/20/2021   10:08 AM 03/08/2020    3:08 PM  Depression screen PHQ 2/9  Decreased Interest 0 0 0  Down, Depressed, Hopeless 0 0 0  PHQ - 2 Score 0 0 0     Other: (CHADS2VASc if Afib, MMRC or CAT for COPD, ACT, DEXA)  Social History   Tobacco Use  Smoking  Status Every Day   Packs/day: 0.50   Years: 50.00   Total pack years: 25.00   Types: Cigarettes  Smokeless Tobacco Never  Tobacco Comments   down to .5 ppd   BP Readings from Last 3 Encounters:  05/12/22 124/60  03/14/22 (!) 148/72  02/17/22 130/66   Pulse Readings from Last 3 Encounters:  05/12/22 60  03/14/22 83  02/17/22 68   Wt Readings from Last 3 Encounters:  05/12/22 198 lb (89.8 kg)  03/14/22 198 lb (89.8 kg)  02/17/22 199 lb (90.3 kg)   BMI Readings from Last 3 Encounters:  05/12/22 28.82 kg/m  03/14/22 28.82 kg/m  02/17/22 28.97 kg/m    Assessment/Interventions: Review of patient past medical history, allergies, medications, health status, including review of consultants reports, laboratory and other test data, was performed as part of comprehensive evaluation and provision of chronic care management services.   SDOH:  (Social Determinants of Health) assessments and interventions performed: No SDOH Interventions    Flowsheet Row Most Recent Value  SDOH Interventions   Financial Strain Interventions Intervention Not Indicated  Transportation Interventions Intervention Not Indicated      SDOH Screenings   Alcohol Screen: Not on file  Depression (PHQ2-9): Low Risk  (01/05/2022)   Depression (PHQ2-9)    PHQ-2 Score: 0  Financial Resource Strain: Low Risk  (05/17/2022)   Overall Financial Resource Strain (CARDIA)    Difficulty of Paying Living Expenses: Not hard at all  Food Insecurity: Not on file  Housing: Not on file  Physical Activity: Not on file  Social Connections: Not on file  Stress: Not on file  Tobacco Use: High Risk (05/16/2022)   Patient History    Smoking Tobacco Use: Every Day    Smokeless Tobacco Use: Never    Passive Exposure: Not on file  Transportation Needs: No Transportation Needs (05/17/2022)   PRAPARE - Transportation    Lack of Transportation (Medical): No    Lack of Transportation (Non-Medical): No    CCM Care  Plan  Allergies  Allergen Reactions   Penicillins Rash and Hives    Has patient had a PCN reaction causing immediate rash, facial/tongue/throat swelling, SOB or lightheadedness with hypotension:unsure Has patient had a PCN reaction causing severe rash involving mucus membranes or skin necrosis:unsure Has patient had a PCN reaction that required hospitalization:No Has patient had a PCN reaction occurring within the last 10 years:No If all of the above answers are "NO", then may proceed with Cephalosporin use.  rash   Prednisone     Depressed mood - can tolerate it, but it makes him very irritable   Doxycycline Rash   Iodinated Contrast Media Rash    Also developed blisters Also developed blisters   Penicillin G Rash   Tramadol Rash    Medications Reviewed Today     Reviewed by Lane Hacker, [facility] (Pharmacist) on 05/17/22 at [place] List Status: <None>   Medication Order Taking? Sig Documenting Provider Last Dose Status Informant  ALPRAZolam (XANAX) 0.25 MG tablet [id number]  TAKE 1 TABLET BY MOUTH ONCE DAILY AS NEEDED FOR ANXIETY Cox, Kirsten, MD  Active   amLODipine (NORVASC) 5 MG tablet [id number] No TAKE 1 TABLET BY MOUTH ONCE DAILY . APPOINTMENT REQUIRED FOR FUTURE REFILLS  Patient not taking: Reported on 05/17/2022   Brand Males, MD Not Taking Consider Medication Status and Discontinue   aspirin 81 MG tablet [id number] Yes Take 162 mg by mouth daily.  [provider] Taking Active Spouse/Significant Other           Med Note Theda Sers, Beverly Gust   Fri May 09, 2019  2:45 PM)    clopidogrel (PLAVIX) 75 MG tablet [id number] Yes Take 1 tablet by mouth once daily Lillard Anes, MD Taking Active   cyclobenzaprine (FLEXERIL) 10 MG tablet [id number] Yes One three times a day as needed for muscle spasm. Cox, Kirsten, MD Taking Active   DULoxetine (CYMBALTA) 60 MG  capsule [id number] Yes Take 1 capsule by mouth twice daily Cox, Kirsten, MD Taking Active   finasteride  (PROSCAR) 5 MG tablet [id number] Yes Take 1 tablet by mouth once daily Cox, Kirsten, MD Taking Active   fluticasone furoate-vilanterol (BREO ELLIPTA) 100-25 MCG/ACT AEPB [id number]  INHALE 1 PUFF BY MOUTH ONCE DAILY Cox, Kirsten, MD  Active   gabapentin (NEURONTIN) 400 MG capsule [id number] Yes TAKE 1 CAPSULE BY MOUTH THREE TIMES DAILY Cox, Kirsten, MD Taking Active   HYDROcodone-acetaminophen (NORCO/VICODIN) 5-325 MG tablet [id number] Yes Take 1 tablet by mouth every 8 (eight) hours as needed for moderate pain. Cox, Kirsten, MD Taking Active   ipratropium-albuterol (DUONEB) 0.5-2.5 (3) MG/3ML Bailey Mech [id number]  USE 1 AMPULE IN NEBULIZER 4 TIMES DAILY Cox, Kirsten, MD  Active   Lancets (ONETOUCH DELICA PLUS KWIOXB35H) [place] [id number]  USE 1  TO CHECK GLUCOSE THREE TIMES DAILY Cox, Kirsten, MD  Active   metFORMIN (GLUCOPHAGE) 500 MG tablet [id number] Yes Take 1 tablet by mouth twice daily Cox, Kirsten, MD Taking Active   metoprolol tartrate (LOPRESSOR) 50 MG tablet [id number] Yes Take 1 tablet by mouth twice daily Marge Duncans, PA-C Taking Active   MYRBETRIQ 25 MG TB24 tablet [id number]  Take 1 tablet by mouth once daily Marge Duncans, PA-C  Active   nitroGLYCERIN (NITROSTAT) 0.4 MG SL tablet [id number]  DISSOLVE ONE TABLET UNDER THE TONGUE EVERY 5-10 MINUTES PRIOR TO ACTIVITIES WHICH MIGHT PERCIPITATE AN [place]. Marge Duncans, PA-C  Active   nystatin cream (MYCOSTATIN) [id number]  Apply 1 application topically 2 (two) times daily as needed for dry skin. [provider]  Active   Omega-3 Fatty Acids (FISH OIL) 1000 MG CAPS [id number]  Take 2 capsules (2,000 mg total) by mouth in the morning and at bedtime. Richardo Priest, MD  Active   omeprazole (PRILOSEC) 20 MG capsule [id number]  Take 1 capsule by mouth once daily Don Brome, MD  Active   [facility] ULTRA test strip [id number]  USE 1 STRIP TO CHECK GLUCOSE TWICE DAILY Cox, Kirsten, MD  Active   rosuvastatin (CRESTOR) 40 MG tablet [id number] Yes Take 1 tablet by  mouth once daily Don Brome, MD Taking Active   SPIRIVA RESPIMAT 2.5 MCG/ACT AERS [id number]  Inhale 2 puffs into the lungs daily. Cox, Kirsten, MD  Active   tamsulosin (FLOMAX) 0.4 MG CAPS capsule [id number] Yes TAKE 2 CAPSULES BY MOUTH ONCE DAILY AFTER SUPPER Cox, Kirsten, MD Taking Active   TOUJEO SOLOSTAR 300 UNIT/ML Solostar Pen [id number]  INJECT 30 UNITS SUBCUTANEOUSLY ONCE DAILY Cox, Kirsten, MD  Active   traZODone (DESYREL) 150 MG tablet [id number] Yes Take 1 tablet by mouth once daily Cox, Kirsten, MD Taking Active   VENTOLIN HFA 108 (90 Base) MCG/ACT inhaler [id number]  Inhale 2 puffs into the lungs every 6 (six) hours as needed for shortness of breath or wheezing. Don Brome, MD  Active             Patient Active Problem List   Diagnosis Date Noted   Obstructive chronic bronchitis with exacerbation ([place]) 05/16/2022   Chronic respiratory failure with hypoxia ([place]) 05/16/2022   Diaphoresis 05/16/2022   Chronic cough 05/16/2022   Diabetic polyneuropathy associated with type 2 diabetes mellitus ([place]) 05/16/2022   Chronic neck pain 05/16/2022   Left flank pain 03/14/2022   Left lower quadrant abdominal tenderness without rebound tenderness 03/14/2022   Cancer ([place]) 02/02/2022   Throat cancer ([place]) 02/02/2022   Anxiety state 02/02/2022   Mood disorder in conditions  classified elsewhere 02/02/2022   Acute bilateral low back pain without sciatica 01/08/2022   Absence of bladder continence 01/08/2022   Deficiency anemia 08/25/2021    Class: Acute   B12 deficiency anemia 08/25/2021    Class: Diagnosis of   Iron deficiency anemia due to chronic blood loss 08/25/2021    Class: Chronic   Blood in ear canal, left 05/13/2021   Insomnia 01/28/2021   Malignant neoplasm of skin 01/28/2021   Arthritis    Basal cell carcinoma    Coronary artery disease    Depression    Emphysema lung (HCC)    GERD (gastroesophageal reflux disease)    Hypertension    Lesion of vocal cord     Melanoma of back [facility])    Peripheral vascular disease ([place])    Pneumothorax 06/07/2020   Diverticulitis 05/02/2020   Diabetes mellitus ([place]) 03/08/2020   Myalgia 03/08/2020   Bone pain 03/08/2020   Memory loss 03/08/2020   CKD (chronic kidney disease) stage 3, GFR 30-59 ml/min ([place]) 07/25/2019   Erectile dysfunction due to diseases classified elsewhere 06/12/2019   Peyronie's disease 06/12/2019   Tobacco use disorder 06/12/2019   Benign prostatic hyperplasia with incomplete bladder emptying 01/14/2019   Malfunction of penile prosthesis ([place]) 01/14/2019   Testicular pain, left 01/14/2019   Long-term use of aspirin therapy 10/02/2018   Oropharyngeal dysphagia 08/12/2018   COPD (chronic obstructive pulmonary disease) ([place]) 04/03/2018   Hyperlipidemia 04/03/2018   Preoperative clearance 04/03/2018   Chest pain syndrome 11/02/2017   Asymptomatic LV dysfunction 10/18/2017   Essential hypertension 10/18/2017   PAD (peripheral artery disease) ([place]) 10/18/2017   Chronic coronary artery disease 10/18/2017   Squamous cell carcinoma of larynx ([place]) 12/13/2016   Cardiomyopathy, secondary ([place]) 12/01/2016   Coronary artery disease involving native coronary artery of native heart with angina pectoris ([place]) 12/01/2016   Dyslipidemia 12/01/2016   Hoarseness 11/15/2016   Leukoplakia of larynx 11/15/2016   Laceration of thumb, left 12/17/2015   Pneumonia 02/2015   Myocardial infarction ([place]) 2000   Chronic ischemic heart disease 11/13/1998   Other ill-defined and unknown causes of morbidity and mortality 11/13/1993    Immunization History  Administered Date(s) Administered   Fluad Quad(high Dose 65+) 08/16/2020, 09/01/2021   H1N1 09/13/2008   Influenza Split 09/01/2021   Influenza, High Dose Seasonal PF 10/13/2017, 08/26/2019, 08/16/2020   Influenza-Unspecified 11/13/2002, 08/19/2004, 09/13/2004, 09/13/2006, 10/17/2007, 08/12/2008, 10/13/2017, 08/26/2019   Moderna SARS-COV2 Booster  Vaccination 10/12/2020   Moderna Sars-Covid-2 Vaccination 10/12/2020   Pneumococcal Conjugate-13 03/13/2000, 10/01/2015   Pneumococcal Polysaccharide-23 11/13/2002, 08/13/2009   Pneumococcal-Unspecified 03/13/2000   Td 11/13/1996    Conditions to be addressed/monitored:  Hypertension, Hyperlipidemia, and Diabetes  Care Plan : [place]  Updates made by Lane Hacker, [place] since 05/17/2022 12:00 AM     Problem: Disease State Management   Priority: High  Onset Date: 05/17/2022     Long-Range Goal: Patient-Specific Goal   Start Date: 05/17/2022  Expected End Date: 05/17/2023  This Visit's Progress: On track  Priority: High  Note:   Current Barriers:  Does not contact provider office for questions/concerns  Pharmacist Clinical Goal(s):  Patient will contact provider office for questions/concerns as evidenced notation of same in electronic health record through collaboration with PharmD and provider.   Interventions: 1:1 collaboration with Don Brome, MD regarding development and update of comprehensive plan of care as evidenced by provider attestation and co-signature Inter-disciplinary care team collaboration (see longitudinal plan of care) Comprehensive medication  review performed; medication list updated in electronic medical record  Hypertension (BP goal <130/80) BP Readings from Last 3 Encounters:  05/12/22 124/60  03/14/22 (!) 148/72  02/17/22 130/66  -Not ideally controlled -Current treatment: Metoprolol Tart 31m BID Appropriate, Effective, Safe, Accessible -Medications previously tried: Amlodipine (Patient stated provider Dc'd)  -Current home readings:   July 2023: Normally around 106/60. Didn't have readings on them -Current dietary habits: "Tries to eat healthy" -Current exercise habits: None -Reports hypotensive/hypertensive symptoms -Educated on BP goals and benefits of medications for prevention of heart attack, stroke and kidney  damage; -Counseled to monitor BP at home daily, document, and provide log at future appointments July 2023; Patient experiencing sweating and dizziness. His BP is typically around 1169systolic per patient. Has f/u with Cardio Friday. Counseled them to bring BP logs to appt  CAD: (LDL goal < 70) The ASCVD Risk score (Arnett DK, et al., 2019) failed to calculate for the following reasons:   The patient has a prior MI or stroke diagnosis Lab Results  Component Value Date   CHOL 129 12/16/2020   Lab Results  Component Value Date   HDL 26 (L) 12/16/2020   Lab Results  Component Value Date   LDLCALC 55 12/16/2020   Lab Results  Component Value Date   TRIG 311 (H) 12/16/2020   Lab Results  Component Value Date   CHOLHDL 5.0 12/16/2020  No results found for: "LDLDIRECT"  -Controlled -Current treatment: ASA 1613mAppropriate, Effective, Safe, Accessible Rosuvastatin 4031mppropriate, Effective, Safe, Accessible Clopidogrel 54m67mpropriate, Effective, Safe, Accessible -Medications previously tried: N/A  -Current dietary patterns: "Tries to eat healthy" -Current exercise habits: None -Educated on Cholesterol goals;  July 2023: Has f/u with Cardio Friday, will defer to specialist  Diabetes (A1c goal <8%) Lab Results  Component Value Date   HGBA1C 6.8 (H) 12/16/2020   Lab Results  Component Value Date   MICROALBUR 30 05/23/2021   LDLCALC 55 12/16/2020   CREATININE 0.85 03/14/2022   Lab Results  Component Value Date   NA 137 03/14/2022   K 4.5 03/14/2022   CREATININE 0.85 03/14/2022   EGFR 92 03/14/2022   GFRNONAA 87 12/16/2020   GLUCOSE 355 (H) 03/14/2022   Lab Results  Component Value Date   WBC 12.0 (H) 03/14/2022   HGB 13.9 03/14/2022   HCT 41.4 03/14/2022   MCV 84 03/14/2022   PLT 183 03/14/2022  -Controlled -Current medications: Metformin 500mg24m Appropriate, Effective, Safe, Accessible Toujeo Appropriate, Effective, Safe, Accessible -Medications  previously tried: N/A  -Current home glucose readings fasting glucose:  July 2023: 100-140 post prandial glucose: N/A -Denies hypoglycemic/hyperglycemic symptoms -Current exercise: N/A -Educated on A1c and blood sugar goals; -Counseled to check feet daily and get yearly eye exams July 2023: Patient states he sweats often. They do check his sugas and they are around 100-110, most likely not the source. Candidate for ACE/ARB  Depression/Anxiety (Goal: Decrease symptoms) -Controlled -Current treatment: Duloxetine 60mg 35mAppropriate, Effective, Safe, Accessible Alprazolam 0.25mg [id number] Appropriate,  -Medications previously tried/failed: N/A -PHQ9:     01/05/2022    3:45 PM 05/20/2021   10:08 AM 03/08/2020    3:08 PM  Depression screen PHQ 2/9  Decreased Interest 0 0 0  Down, Depressed, Hopeless 0 0 0  PHQ - 2 Score 0 0 0  -GAD7:      No data to display         -Educated on Benefits of medication for symptom control -Recommended to  continue current medication. Counseled on risks with CS  BPH (Goal: improve urination) -IPSS Questionnaire (AUA-7): Over the past month.   1)  How often have you had a sensation of not emptying your bladder completely after you finish urinating?  Patient didn't understand the question, will write down peeing log and bring into f/u with PCP  2)  How often have you had to urinate again less than two hours after you finished urinating? Patient didn't understand the question, will write down peeing log and bring into f/u with PCP  3)  How often have you found you stopped and started again several times when you urinated?  Patient didn't understand the question, will write down peeing log and bring into f/u with PCP  4) How difficult have you found it to postpone urination?  Patient didn't understand the question, will write down peeing log and bring into f/u with PCP  5) How often have you had a weak urinary stream?  Patient didn't understand the  question, will write down peeing log and bring into f/u with PCP  6) How often have you had to push or strain to begin urination?  Patient didn't understand the question, will write down peeing log and bring into f/u with PCP  7) How many times did you most typically get up to urinate from the time you went to bed until the time you got up in the morning?  Patient didn't understand the question, will write down peeing log and bring into f/u with PCP  Total score:  0-7 mildly symptomatic   8-19 moderately symptomatic   20-35 severely symptomatic    -Not ideally controlled -Night time urination frequency: Patient didn't answer question -Current treatment: Tamsulosin 0.57m 2QD Appropriate, Effective, Safe, Accessible Finasteride 531mQD Appropriate, Effective, Safe, Accessible Mybetriq 2547muery Appropriate,  -Medications previously tried: N/A -Counseled on non-pharmacologic techniques such as Kegels July 2023: Mybetriq could interfere with Pulm meds. But patient states he also suffers from constipation and I would prefer not to use an anticholinergic  Chronic Pain -Uncontrolled -Current treatment: Duloxetine 66m34mD Query Appropriate,  Hydrocodone/APAP Query Appropriate,  Cyclobenzaprine 10mg30mry Appropriate,  -Medications previously tried: N/A  -Pain Scale There were no vitals filed for this visit.  Aggravating Factors: Chronic  Pain Type: Chronic  July 2023: Unable to do pain exam, spoke primarily with wife. Recommend Tizanidine due to being elderly. Both Duloxetine and Hydrocodone go through CYP2D6. Hydrocodone, a prodrug, is unable to metabolize to active metabolite. Recommend alternative treatment modality (Patient needs Duloxetine for mood and when he's tried to DC in past he "Hears trains")  Insomnia (Goal: 7-8 hours of sleep/night) -Uncontrolled -Patient currently getting 4 hours of sleep/night -Patient currently waking up Multiple times/night -Patient's concern is Both  falling/staying alseep -Current treatment: Alprazolam 0.25mg 83mQuery Appropriate,  Trazodone 150mg Q89m Appropriate,  -Medications previously tried: N/A -Counseled on sleep hygiene techniques (consistent sleep/wake up schedule, no electronic screens 1-2 hours before bed, etc) July 2023: Neither of these are preferred therapies. Patient most likely is unable to sleep due to pain. If we an get his pain addressed, will assess sleep patterns when under control    Patient Goals/Self-Care Activities Patient will:  - take medications as prescribed as evidenced by patient report and record review  Follow Up Plan: The patient has been provided with contact information for the care management team and has been advised to call with any health related questions or concerns.   CPP F/U Sept  [place], Pharm.D. - [phone]        Medication Assistance: None required.  Patient affirms current coverage meets needs.  Compliance/Adherence/Medication fill history: Star Rating Drugs:  Medication:                Last Fill:         Day Supply Metformin                    05/07/22-02/07/22 90ds Rosuvastatin               04/27/22-02/06/22 90ds     Care Gaps: Last Annual Wellness visit?None noted  Last Colonoscopy?10/19/21 Last Bone Denisty? Never done Last eye exam / retinopathy screening?Never done Last diabetic foot exam?Never done   Immunizations:  Pneumonia 10/01/15 Covid 10/12/20 Influenza 09/01/21  Patient's preferred pharmacy is:  [facility] [address], [place] [address] [place] [address] Phone: [phone] Fax: [phone]  Uses pill box? Yes Pt endorses 100% compliance  We discussed: Current pharmacy is preferred with insurance plan and patient is satisfied with pharmacy services Patient decided to: Continue current medication management strategy  Care Plan and Follow Up Patient Decision:  Patient agrees to Care Plan  and Follow-up.  Plan: The patient has been provided with contact information for the care management team and has been advised to call with any health related questions or concerns.   CPP F/U Sept 2023  [place] Constable, Sherian Rein.D. - [phone]

## 2022-05-19 ENCOUNTER — Encounter: Payer: Self-pay | Admitting: Cardiology

## 2022-05-19 ENCOUNTER — Ambulatory Visit: Payer: Medicare Other | Admitting: Family Medicine

## 2022-05-19 ENCOUNTER — Encounter (HOSPITAL_COMMUNITY): Payer: Self-pay | Admitting: *Deleted

## 2022-05-19 ENCOUNTER — Other Ambulatory Visit: Payer: Self-pay

## 2022-05-19 ENCOUNTER — Telehealth (HOSPITAL_COMMUNITY): Payer: Self-pay | Admitting: *Deleted

## 2022-05-19 ENCOUNTER — Other Ambulatory Visit: Payer: Medicare Other

## 2022-05-19 ENCOUNTER — Ambulatory Visit: Payer: Medicare Other | Admitting: Cardiology

## 2022-05-19 VITALS — BP 126/84 | HR 83 | Ht 70.0 in | Wt 198.0 lb

## 2022-05-19 DIAGNOSIS — E782 Mixed hyperlipidemia: Secondary | ICD-10-CM

## 2022-05-19 DIAGNOSIS — I25119 Atherosclerotic heart disease of native coronary artery with unspecified angina pectoris: Secondary | ICD-10-CM | POA: Diagnosis not present

## 2022-05-19 DIAGNOSIS — J431 Panlobular emphysema: Secondary | ICD-10-CM

## 2022-05-19 DIAGNOSIS — E1142 Type 2 diabetes mellitus with diabetic polyneuropathy: Secondary | ICD-10-CM

## 2022-05-19 DIAGNOSIS — I10 Essential (primary) hypertension: Secondary | ICD-10-CM

## 2022-05-19 DIAGNOSIS — F17200 Nicotine dependence, unspecified, uncomplicated: Secondary | ICD-10-CM

## 2022-05-19 NOTE — Progress Notes (Signed)
Cardiology Office Note:    Date:  05/19/2022   ID:  Don [name], DOB 1951/05/25, MRN [id number]  PCP:  Don Brome, MD  Cardiologist:  Don Lindau, MD   Referring MD: Don Brome, MD    ASSESSMENT:    1. Coronary artery disease involving native coronary artery of native heart with angina pectoris ([place])   2. Essential hypertension   3. Mixed hyperlipidemia   4. Panlobular emphysema ([place])   5. Diabetic polyneuropathy associated with type 2 diabetes mellitus ([place])   6. Tobacco use disorder    PLAN:    In order of problems listed above:  Dyspnea on exertion, chest tightness in a patient with known coronary artery disease: Secondary prevention stressed with the patient.  Importance of compliance with diet medication stressed any vocalized understanding.  Sublingual nitroglycerin prescription was sent, its protocol and 911 protocol explained and the patient vocalized understanding questions were answered to the patient's satisfaction.  In view of this we will do a Lexiscan sestamibi and is agreeable Essential hypertension: Blood pressure stable and diet was emphasized. Mixed dyslipidemia: On lipid-lowering therapy and followed by primary care.  Labs are pending. Diabetes mellitus followed by primary care.  Diet emphasized. Cigarette smoker: I spent 5 minutes with the patient discussing solely about smoking. Smoking cessation was counseled. I suggested to the patient also different medications and pharmacological interventions. Patient is keen to try stopping on its own at this time. He will get back to me if he needs any further assistance in this matter. Patient will be seen in follow-up appointment in 9 months or earlier if the patient has any concerns    Medication Adjustments/Labs and Tests Ordered: Current medicines are reviewed at length with the patient today.  Concerns regarding medicines are outlined above.  No orders of the defined types were placed in this  encounter.  No orders of the defined types were placed in this encounter.    No chief complaint on file.    History of Present Illness:    Don [name] is a 71 y.o. male.  Patient has past medical history of coronary artery disease, essential hypertension, mixed dyslipidemia, diabetes mellitus and cigarette smoking.  He has COPD.  He mentions to me that he has some increasing shortness of breath on exertion.  No chest pain orthopnea.  He occasionally has chest tightness.  At the time of my evaluation, the patient is alert awake oriented and in no distress.  Unfortunately continues to smoke.  Past Medical History:  Diagnosis Date   Absence of bladder continence 01/08/2022   Acute bilateral low back pain without sciatica 01/08/2022   Anxiety state 02/02/2022   Arthritis    Asymptomatic LV dysfunction 10/18/2017   B12 deficiency anemia 08/25/2021   Basal cell carcinoma    Benign prostatic hyperplasia with incomplete bladder emptying 01/14/2019   Blood in ear canal, left 05/13/2021   Bone pain 03/08/2020   Cancer ([place])    throat - 1997, throat - 2018   Cardiomyopathy, secondary ([place]) 12/01/2016   Overview:  EF 47% 12/26/16   Chest pain syndrome 11/02/2017   Chronic coronary artery disease 10/18/2017   Chronic cough 05/16/2022   Chronic ischemic heart disease 11/13/1998   Jan 22, 2003 Entered By: Marland Kitchen J Comment:  massive Mi in 2000 per patient hsitory, 2nd Mi in 2001Mar 11, 2004 Entered By: Marland Kitchen J Comment: Had stent placedin 2000,cath/angio in 2001   Chronic neck pain 05/16/2022   Chronic respiratory  failure with hypoxia ([place]) 05/16/2022   CKD (chronic kidney disease) stage 3, GFR 30-59 ml/min (HCC) 07/25/2019   COPD (chronic obstructive pulmonary disease) (HCC)    Coronary artery disease    Coronary artery disease involving native coronary artery of native heart with angina pectoris ([place]) 12/01/2016   Overview:  He has hx of IWMI in remote past, last cath in 2012 at [facility]  showed chronic total occlusion of previously stented RCA, with good collaterals, a 40% LAD stenosis, inferior hypokinesis and EF 45%.    He's been lost to Cardiology f/u since 2015, but has not had any recurrent events   Deficiency anemia 08/25/2021   Depression    Diabetes mellitus ([place]) 03/08/2020   Diabetic polyneuropathy associated with type 2 diabetes mellitus ([place]) 05/16/2022   Diaphoresis 05/16/2022   Diverticulitis 05/02/2020   Dyslipidemia 12/01/2016   Emphysema lung ([place])    Erectile dysfunction due to diseases classified elsewhere 06/12/2019   Essential hypertension 10/18/2017   GERD (gastroesophageal reflux disease)    Hoarseness 11/15/2016   Hyperlipidemia    Hypertension    Insomnia 01/28/2021   Iron deficiency anemia due to chronic blood loss 08/25/2021   Laceration of thumb, left 12/17/2015   Left flank pain 03/14/2022   Left lower quadrant abdominal tenderness without rebound tenderness 03/14/2022   Lesion of vocal cord    Leukoplakia of larynx 11/15/2016   Long-term use of aspirin therapy 10/02/2018   Malfunction of penile prosthesis ([place]) 01/14/2019   Malignant neoplasm of skin 01/28/2021   Formatting of this note might be different from the original. Jan 22, 2003 Entered By: Marland Kitchen J Comment: of skin - removed x2 inpast Jan 22, 2003 Entered By: Marland Kitchen J Comment: of skin - removed x2 inpast   Melanoma of back ([place])    melanoma on back   Memory loss 03/08/2020   Mood disorder in conditions classified elsewhere 02/02/2022   Myalgia 03/08/2020   Myocardial infarction [facility]) 2000   2 stents   Obstructive chronic bronchitis with exacerbation ([place]) 05/16/2022   Oropharyngeal dysphagia 08/12/2018   Other ill-defined and unknown causes of morbidity and mortality 11/13/1993   Formatting of this note might be different from the original. Jan 31, 2008 Entered By: ARMOUR,ROSS B Comment: lumbar, 8/08 Jan 22, 2003 Entered By: Marland Kitchen J Comment: x2yr in work place  quit  368yrago in 2000 Jan 22, 2003 Entered By: MAMarland Kitchen Comment: x2531yrn work place  quit 28yr67yro in 2000 Jan 31, 2008 Entered By: ARMOStephannie Petersomment: lumbar, 8/08   PAD (peripheral artery disease) (HCC)[place]/04/2017   Peripheral vascular disease (HCC)[place] iliac artery clot   Peyronie's disease 06/12/2019   Pharyngoesophageal dysphagia 08/12/2018   Pneumonia 02/2015   Pneumothorax 06/07/2020   Preoperative clearance 04/03/2018   Squamous cell carcinoma of larynx (HCC)[place]/31/2018   Testicular pain, left 01/14/2019   Throat cancer (HCC)[place] Tobacco use disorder 06/12/2019    Past Surgical History:  Procedure Laterality Date   ABDOMINAL ANGIOGRAM N/A 01/13/2015   Procedure: ABDOMINAL ANGIOGRAM;  Surgeon: ToddRosetta Posner;  [facility];  Service: Cardiovascular;  Laterality: N/A;   APPENDECTOMY     BACK SURGERY     CARDIAC CATHETERIZATION  2000/2012   [place] stents in [place] bilateral   ESOPHAGOGASTRODUODENOSCOPY     KNEE SURGERY Left    MICROLARYNGOSCOPY  WITH CO2 LASER AND EXCISION OF VOCAL CORD LESION N/A 11/23/2016   Procedure: MICROLARYNGOSCOPY  AND EXCISION OF VOCAL CORD LESION;  Surgeon: Melissa Montane, MD;  Location: [facility];  Service: ENT;  Laterality: N/A;   MICROLARYNGOSCOPY WITH CO2 LASER AND EXCISION OF VOCAL CORD LESION N/A 01/11/2017   Procedure: MICROLARYNGOSCOPY WITH CO2 LASER AND EXCISION OF VOCAL CORD LESION;  Surgeon: Melissa Montane, MD;  Location: [facility];  Service: ENT;  Laterality: N/A;   MICROLARYNGOSCOPY WITH LASER N/A 03/16/2015   Procedure: MICROLARYNGOSCOPY ;  Surgeon: Melissa Montane, MD;  Location: [facility] OR;  Service: ENT;  Laterality: N/A;   peyronie's surgery     SHOULDER SURGERY Right    rotator cuff   SPINE SURGERY  09/2020   cervical surgery. steel plate, bone spurs, allograft.   THROAT SURGERY  1997   cancer removed    Current Medications: Current Meds  Medication Sig   ALPRAZolam (XANAX) 0.25 MG  tablet TAKE 1 TABLET BY MOUTH ONCE DAILY AS NEEDED FOR ANXIETY   aspirin 81 MG tablet Take 162 mg by mouth daily.    clopidogrel (PLAVIX) 75 MG tablet Take 1 tablet by mouth once daily   cyclobenzaprine (FLEXERIL) 10 MG tablet One three times a day as needed for muscle spasm.   DULoxetine (CYMBALTA) 60 MG capsule Take 1 capsule by mouth twice daily   finasteride (PROSCAR) 5 MG tablet Take 1 tablet by mouth once daily   fluticasone furoate-vilanterol (BREO ELLIPTA) 100-25 MCG/ACT AEPB INHALE 1 PUFF BY MOUTH ONCE DAILY   gabapentin (NEURONTIN) 400 MG capsule TAKE 1 CAPSULE BY MOUTH THREE TIMES DAILY   HYDROcodone-acetaminophen (NORCO/VICODIN) 5-325 MG tablet Take 1 tablet by mouth every 8 (eight) hours as needed for moderate pain.   ipratropium-albuterol (DUONEB) 0.5-2.5 (3) MG/3ML SOLN USE 1 AMPULE IN NEBULIZER 4 TIMES DAILY   Lancets (ONETOUCH DELICA PLUS [id number]) MISC USE 1  TO CHECK GLUCOSE THREE TIMES DAILY   metFORMIN (GLUCOPHAGE) 500 MG tablet Take 1 tablet by mouth twice daily   metoprolol tartrate (LOPRESSOR) 50 MG tablet Take 1 tablet by mouth twice daily   MYRBETRIQ 25 MG TB24 tablet Take 1 tablet by mouth once daily   nitroGLYCERIN (NITROSTAT) 0.4 MG SL tablet DISSOLVE ONE TABLET UNDER THE TONGUE EVERY 5-10 MINUTES PRIOR TO ACTIVITIES WHICH MIGHT PERCIPITATE AN ATTTACK.   nystatin cream (MYCOSTATIN) Apply 1 application topically 2 (two) times daily as needed for dry skin.   Omega-3 Fatty Acids (FISH OIL) 1000 MG CAPS Take 2 capsules (2,000 mg total) by mouth in the morning and at bedtime.   omeprazole (PRILOSEC) 20 MG capsule Take 1 capsule by mouth once daily   ONETOUCH ULTRA test strip USE 1 STRIP TO CHECK GLUCOSE TWICE DAILY   rosuvastatin (CRESTOR) 40 MG tablet Take 1 tablet by mouth once daily   SPIRIVA RESPIMAT 2.5 MCG/ACT AERS Inhale 2 puffs into the lungs daily.   tamsulosin (FLOMAX) 0.4 MG CAPS capsule TAKE 2 CAPSULES BY MOUTH ONCE DAILY AFTER SUPPER   TOUJEO SOLOSTAR 300  UNIT/ML Solostar Pen INJECT 30 UNITS SUBCUTANEOUSLY ONCE DAILY   traZODone (DESYREL) 150 MG tablet Take 1 tablet by mouth once daily   VENTOLIN HFA 108 (90 Base) MCG/ACT inhaler Inhale 2 puffs into the lungs every 6 (six) hours as needed for shortness of breath or wheezing.     Allergies:   Penicillins, Prednisone, Doxycycline, Iodinated contrast media, Penicillin g, and Tramadol   Social History   Socioeconomic History   Marital  status: Married    Spouse name: Not on file   Number of children: 4   Years of education: Not on file   Highest education level: GED or equivalent  Occupational History   Occupation: retired  Tobacco Use   Smoking status: Every Day    Packs/day: 0.50    Years: 50.00    Total pack years: 25.00    Types: Cigarettes   Smokeless tobacco: Never   Tobacco comments:    down to .5 ppd  Vaping Use   Vaping Use: Former  Substance and Sexual Activity   Alcohol use: No    Alcohol/week: 0.0 standard drinks of alcohol   Drug use: No   Sexual activity: Not on file  Other Topics Concern   Not on file  Social History Narrative   Lives with wife       One level      Right hand   Social Determinants of Health   Financial Resource Strain: Low Risk  (05/17/2022)   Overall Financial Resource Strain (CARDIA)    Difficulty of Paying Living Expenses: Not hard at all  Food Insecurity: Not on file  Transportation Needs: No Transportation Needs (05/17/2022)   PRAPARE - Hydrologist (Medical): No    Lack of Transportation (Non-Medical): No  Physical Activity: Not on file  Stress: Not on file  Social Connections: Not on file     Family History: The patient's family history includes Cancer in his mother and another family member; Diabetes in an other family member; Healthy in his child; Hypertension in his father and mother; Stroke in his father.  ROS:   Please see the history of present illness.    All other systems reviewed and are  negative.  EKGs/Labs/Other Studies Reviewed:    The following studies were reviewed today: I discussed my findings with the patient at length   Recent Labs: 03/14/2022: ALT 11; BUN 10; Creatinine, Ser 0.85; Hemoglobin 13.9; Platelets 183; Potassium 4.5; Sodium 137  Recent Lipid Panel    Component Value Date/Time   CHOL 129 12/16/2020 0913   TRIG 311 (H) 12/16/2020 0913   HDL 26 (L) 12/16/2020 0913   CHOLHDL 5.0 12/16/2020 0913   LDLCALC 55 12/16/2020 0913    Physical Exam:    VS:  BP 126/84   Pulse 83   Ht '5\' 10"'$  (1.778 m)   Wt 198 lb (89.8 kg)   SpO2 94%   BMI 28.41 kg/m     Wt Readings from Last 3 Encounters:  05/19/22 198 lb (89.8 kg)  05/12/22 198 lb (89.8 kg)  03/14/22 198 lb (89.8 kg)     GEN: Patient is in no acute distress HEENT: Normal NECK: No JVD; No carotid bruits LYMPHATICS: No lymphadenopathy CARDIAC: Hear sounds regular, 2/6 systolic murmur at the apex. RESPIRATORY:  Clear to auscultation without rales, wheezing or rhonchi  ABDOMEN: Soft, non-tender, non-distended MUSCULOSKELETAL:  No edema; No deformity  SKIN: Warm and dry NEUROLOGIC:  Alert and oriented x 3 PSYCHIATRIC:  Normal affect   Signed, Don Lindau, MD  05/19/2022 9:38 AM    [place]

## 2022-05-19 NOTE — Patient Instructions (Signed)
Medication Instructions:  Your physician recommends that you continue on your current medications as directed. Please refer to the Current Medication list given to you today.  *If you need a refill on your cardiac medications before your next appointment, please call your pharmacy*   Lab Work: None ordered If you have labs (blood work) drawn today and your tests are completely normal, you will receive your results only by: [place] (if you have MyChart) OR A paper copy in the mail If you have any lab test that is abnormal or we need to change your treatment, we will call you to review the results.   Testing/Procedures: Your physician has requested that you have a lexiscan myoview. For further information please visit [email or web address]. Please follow instruction sheet, as given.  The test will take approximately 3 to 4 hours to complete; you may bring reading material.  If someone comes with you to your appointment, they will need to remain in the main lobby due to limited space in the testing area. **If you are pregnant or breastfeeding, please notify the nuclear lab prior to your appointment**  How to prepare for your Myocardial Perfusion Test: Do not eat or drink 3 hours prior to your test, except you may have water. Do not consume products containing caffeine (regular or decaffeinated) 12 hours prior to your test. (ex: coffee, chocolate, sodas, tea). Do bring a list of your current medications with you.  If not listed below, you may take your medications as normal. Do wear comfortable clothes (no dresses or overalls) and walking shoes, tennis shoes preferred (No heels or open toe shoes are allowed). Do NOT wear cologne, perfume, aftershave, or lotions (deodorant is allowed). If these instructions are not followed, your test will have to be rescheduled.    Follow-Up: At [facility], you and your health needs are our priority.  As part of our continuing mission to provide  you with exceptional heart care, we have created designated Provider Care Teams.  These Care Teams include your primary Cardiologist (physician) and Advanced Practice Providers (APPs -  Physician Assistants and Nurse Practitioners) who all work together to provide you with the care you need, when you need it.  We recommend signing up for the patient portal called "MyChart".  Sign up information is provided on this After Visit Summary.  MyChart is used to connect with patients for Virtual Visits (Telemedicine).  Patients are able to view lab/test results, encounter notes, upcoming appointments, etc.  Non-urgent messages can be sent to your provider as well.   To learn more about what you can do with MyChart, go to [email or web address].    Your next appointment:   9 month(s)  The format for your next appointment:   In Person  Provider:   Jyl Heinz, MD   Other Instructions Cardiac Nuclear Scan A cardiac nuclear scan is a test that is done to check the flow of blood to your heart. It is done when you are resting and when you are exercising. The test looks for problems such as: Not enough blood reaching a portion of the heart. The heart muscle not working as it should. You may need this test if: You have heart disease. You have had lab results that are not normal. You have had heart surgery or a balloon procedure to open up blocked arteries (angioplasty). You have chest pain. You have shortness of breath. In this test, a special dye (tracer) is put into your bloodstream. The tracer will travel  to your heart. A camera will then take pictures of your heart to see how the tracer moves through your heart. This test is usually done at a hospital and takes 2-4 hours. Tell a doctor about: Any allergies you have. All medicines you are taking, including vitamins, herbs, eye drops, creams, and over-the-counter medicines. Any problems you or family members have had with anesthetic  medicines. Any blood disorders you have. Any surgeries you have had. Any medical conditions you have. Whether you are pregnant or may be pregnant. What are the risks? Generally, this is a safe test. However, problems may occur, such as: Serious chest pain and heart attack. This is only a risk if the stress portion of the test is done. Rapid heartbeat. A feeling of warmth in your chest. This feeling usually does not last long. Allergic reaction to the tracer. What happens before the test? Ask your doctor about changing or stopping your normal medicines. This is important. Follow instructions from your doctor about what you cannot eat or drink. Remove your jewelry on the day of the test. What happens during the test? An IV tube will be inserted into one of your veins. Your doctor will give you a small amount of tracer through the IV tube. You will wait for 20-40 minutes while the tracer moves through your bloodstream. Your heart will be monitored with an electrocardiogram (ECG). You will lie down on an exam table. Pictures of your heart will be taken for about 15-20 minutes. You may also have a stress test. For this test, one of these things may be done: You will be asked to exercise on a treadmill or a stationary bike. You will be given medicines that will make your heart work harder. This is done if you are unable to exercise. When blood flow to your heart has peaked, a tracer will again be given through the IV tube. After 20-40 minutes, you will get back on the exam table. More pictures will be taken of your heart. Depending on the tracer that is used, more pictures may need to be taken 3-4 hours later. Your IV tube will be removed when the test is over. The test may vary among doctors and hospitals. What happens after the test? Ask your doctor: Whether you can return to your normal schedule, including diet, activities, and medicines. Whether you should drink more fluids. This will  help to remove the tracer from your body. Drink enough fluid to keep your pee (urine) pale yellow. Ask your doctor, or the department that is doing the test: When will my results be ready? How will I get my results? Summary A cardiac nuclear scan is a test that is done to check the flow of blood to your heart. Tell your doctor whether you are pregnant or may be pregnant. Before the test, ask your doctor about changing or stopping your normal medicines. This is important. Ask your doctor whether you can return to your normal activities. You may be asked to drink more fluids. This information is not intended to replace advice given to you by your health care provider. Make sure you discuss any questions you have with your health care provider. Document Revised: 02/19/2019 Document Reviewed: 04/15/2018 Elsevier Patient Education  [place].

## 2022-05-19 NOTE — Telephone Encounter (Signed)
My chart letter sent outlining instructions for upcoming test on 05/24/22

## 2022-05-20 LAB — CARDIOVASCULAR RISK ASSESSMENT

## 2022-05-20 LAB — LIPID PANEL
Chol/HDL Ratio: 5.1 ratio — ABNORMAL HIGH (ref 0.0–5.0)
Cholesterol, Total: 117 mg/dL (ref 100–199)
HDL: 23 mg/dL — ABNORMAL LOW (ref >39)
LDL Chol Calc (NIH): 27 mg/dL (ref 0–99)
Triglycerides: 481 mg/dL — ABNORMAL HIGH (ref 0–149)
VLDL Cholesterol Cal: 67 mg/dL — ABNORMAL HIGH (ref 5–40)

## 2022-05-20 LAB — COMPREHENSIVE METABOLIC PANEL
ALT: 20 IU/L (ref 0–44)
AST: 18 IU/L (ref 0–40)
Albumin/Globulin Ratio: 1.6 (ref 1.2–2.2)
Albumin: 3.9 g/dL (ref 3.7–4.7)
Alkaline Phosphatase: 105 IU/L (ref 44–121)
BUN/Creatinine Ratio: 10 (ref 10–24)
BUN: 10 mg/dL (ref 8–27)
Bilirubin Total: 0.2 mg/dL (ref 0.0–1.2)
CO2: 27 mmol/L (ref 20–29)
Calcium: 9.1 mg/dL (ref 8.6–10.2)
Chloride: 101 mmol/L (ref 96–106)
Creatinine, Ser: 0.99 mg/dL (ref 0.76–1.27)
Globulin, Total: 2.4 g/dL (ref 1.5–4.5)
Glucose: 210 mg/dL — ABNORMAL HIGH (ref 70–99)
Potassium: 4.6 mmol/L (ref 3.5–5.2)
Sodium: 142 mmol/L (ref 134–144)
Total Protein: 6.3 g/dL (ref 6.0–8.5)
eGFR: 81 mL/min/{1.73_m2} (ref >59)

## 2022-05-20 LAB — CBC WITH DIFFERENTIAL/PLATELET
Basophils Absolute: 0.1 10*3/uL (ref 0.0–0.2)
Basos: 1 %
EOS (ABSOLUTE): 0.3 10*3/uL (ref 0.0–0.4)
Eos: 3 %
Hematocrit: 39.3 % (ref 37.5–51.0)
Hemoglobin: 13.1 g/dL (ref 13.0–17.7)
Immature Grans (Abs): 0 10*3/uL (ref 0.0–0.1)
Immature Granulocytes: 0 %
Lymphocytes Absolute: 3 10*3/uL (ref 0.7–3.1)
Lymphs: 31 %
MCH: 27.9 pg (ref 26.6–33.0)
MCHC: 33.3 g/dL (ref 31.5–35.7)
MCV: 84 fL (ref 79–97)
Monocytes Absolute: 0.7 10*3/uL (ref 0.1–0.9)
Monocytes: 7 %
Neutrophils Absolute: 5.6 10*3/uL (ref 1.4–7.0)
Neutrophils: 58 %
Platelets: 212 10*3/uL (ref 150–450)
RBC: 4.7 x10E6/uL (ref 4.14–5.80)
RDW: 14.7 % (ref 11.6–15.4)
WBC: 9.8 10*3/uL (ref 3.4–10.8)

## 2022-05-20 LAB — HEMOGLOBIN A1C
Est. average glucose Bld gHb Est-mCnc: 180 mg/dL
Hgb A1c MFr Bld: 7.9 % — ABNORMAL HIGH (ref 4.8–5.6)

## 2022-05-22 ENCOUNTER — Telehealth: Payer: Self-pay

## 2022-05-22 ENCOUNTER — Other Ambulatory Visit: Payer: Self-pay | Admitting: Physician Assistant

## 2022-05-22 NOTE — Telephone Encounter (Signed)
[name] questioning pain medication changes for Don [name].  She said the pharmacist had mentioned this and you had made reference to it at his last appointment.  He also is questioning whether he needs to be seen with pain management.  He would prefer to be seen by Dr. Tobie Poet if possible.

## 2022-05-22 NOTE — Progress Notes (Signed)
Blood count normal.  B12 and iron studies are unnecessary as hemoglobin is normal.   Liver function normal.  Kidney function normal.  Cholesterol: Results.  Triglycerides very high at 480.  LDL at goal of 27.  HDL too low at 43.  We will discuss at future appointment. HBA1C: 7.9.  Increase metformin to 1000 mg twice daily. Keep appointment coming up presented Glucose 210. We can discuss changing his pain medicines at upcoming visit.

## 2022-05-23 ENCOUNTER — Other Ambulatory Visit: Payer: Self-pay

## 2022-05-23 NOTE — Telephone Encounter (Signed)
Will discuss at next appt. Dr Tobie Poet

## 2022-05-24 ENCOUNTER — Ambulatory Visit (INDEPENDENT_AMBULATORY_CARE_PROVIDER_SITE_OTHER): Payer: Medicare Other

## 2022-05-24 DIAGNOSIS — I25119 Atherosclerotic heart disease of native coronary artery with unspecified angina pectoris: Secondary | ICD-10-CM

## 2022-05-24 LAB — MYOCARDIAL PERFUSION IMAGING
LV dias vol: 121 mL (ref 62–150)
LV sys vol: 62 mL
Nuc Stress EF: 49 %
Peak HR: 90 {beats}/min
Rest HR: 75 {beats}/min
Rest Nuclear Isotope Dose: 9.7 mCi
SDS: 2
SRS: 10
SSS: 12
Stress Nuclear Isotope Dose: 31.6 mCi
TID: 1.04

## 2022-05-24 MED ORDER — TECHNETIUM TC 99M TETROFOSMIN IV KIT
31.6000 | PACK | Freq: Once | INTRAVENOUS | Status: AC | PRN
  Administered 2022-05-24: 31.6 via INTRAVENOUS

## 2022-05-24 MED ORDER — REGADENOSON 0.4 MG/5ML IV SOLN
0.4000 mg | Freq: Once | INTRAVENOUS | Status: AC
  Administered 2022-05-24: 0.4 mg via INTRAVENOUS

## 2022-05-24 MED ORDER — TECHNETIUM TC 99M TETROFOSMIN IV KIT
9.7000 | PACK | Freq: Once | INTRAVENOUS | Status: AC | PRN
  Administered 2022-05-24: 9.7 via INTRAVENOUS

## 2022-05-27 ENCOUNTER — Other Ambulatory Visit: Payer: Self-pay | Admitting: Physician Assistant

## 2022-05-27 ENCOUNTER — Other Ambulatory Visit: Payer: Self-pay | Admitting: Family Medicine

## 2022-06-02 ENCOUNTER — Ambulatory Visit: Payer: Medicare Other | Admitting: Family Medicine

## 2022-06-02 ENCOUNTER — Encounter: Payer: Self-pay | Admitting: Family Medicine

## 2022-06-02 VITALS — BP 122/72 | HR 67 | Temp 96.9°F | Resp 18 | Wt 198.0 lb

## 2022-06-02 DIAGNOSIS — H6692 Otitis media, unspecified, left ear: Secondary | ICD-10-CM | POA: Diagnosis not present

## 2022-06-02 DIAGNOSIS — F17200 Nicotine dependence, unspecified, uncomplicated: Secondary | ICD-10-CM

## 2022-06-02 DIAGNOSIS — J9611 Chronic respiratory failure with hypoxia: Secondary | ICD-10-CM | POA: Diagnosis not present

## 2022-06-02 DIAGNOSIS — I2510 Atherosclerotic heart disease of native coronary artery without angina pectoris: Secondary | ICD-10-CM

## 2022-06-02 DIAGNOSIS — J410 Simple chronic bronchitis: Secondary | ICD-10-CM

## 2022-06-02 DIAGNOSIS — E1142 Type 2 diabetes mellitus with diabetic polyneuropathy: Secondary | ICD-10-CM

## 2022-06-02 DIAGNOSIS — G894 Chronic pain syndrome: Secondary | ICD-10-CM

## 2022-06-02 DIAGNOSIS — E782 Mixed hyperlipidemia: Secondary | ICD-10-CM

## 2022-06-02 DIAGNOSIS — M545 Low back pain, unspecified: Secondary | ICD-10-CM

## 2022-06-02 DIAGNOSIS — F5101 Primary insomnia: Secondary | ICD-10-CM

## 2022-06-02 MED ORDER — AZITHROMYCIN 250 MG PO TABS: ORAL_TABLET | ORAL | 0 refills | Status: DC

## 2022-06-02 MED ORDER — OXYCODONE-ACETAMINOPHEN 5-325 MG PO TABS: 1.0000 | ORAL_TABLET | Freq: Four times a day (QID) | ORAL | 0 refills | Status: AC | PRN

## 2022-06-02 NOTE — Progress Notes (Signed)
Subjective:  Patient ID: Don [name], male    DOB: 1951-03-11  Age: 71 y.o. MRN: [id number]  Chief Complaint  Patient presents with   Diabetes   Chronic Kidney Disease    HPI Diabetes:  Metformin 1000 mg twice daily, Toujeo 30 units daily. 119-127 in am. Postprandial 226. A1C 7.9. Checking feet daily.  Eye exam: prefers not to go because he has macular degeneration.  Hyperlipidemia:  Crestor 40 mg daily   COPD and chronic respiratory failure with hypoxia:  Ventolin 2 puffs every 6 hours as needed for shortness of breath,  spiriva 2 puffs daily. Wears oxygen at night.   Chronic back pain. Dr. Donivan Scull referred him to pain clinic. Hoping they could discuss a spine stimulator. He is on hydrocodone/apap 5/325 mg one three times a day and is on duloxetine. Duloxetine helps with depression. Patient is in pain most of the day. Our pharmacist, Arizona Constable, has brought up a concern for duloxetine decreasing effectiveness of hydrocodone/apap.  Depression and anxiety: On duloxetine, xanax. Trazodone for insomnia.  CORONARY ARTERY DISEASE: On plavix 75 mg daily, metoprolol 50 mg daily, crestor 40 mg once before bed. Has ntg SL.  Urge incontinence: on myrbetriq 25 mg daily. On tamsulosin 0.4 mg before bed.   Current Outpatient Medications on File Prior to Visit  Medication Sig Dispense Refill   ALPRAZolam (XANAX) 0.25 MG tablet TAKE 1 TABLET BY MOUTH ONCE DAILY AS NEEDED FOR ANXIETY 30 tablet 1   clopidogrel (PLAVIX) 75 MG tablet Take 1 tablet by mouth once daily 90 tablet 2   cyclobenzaprine (FLEXERIL) 10 MG tablet One three times a day as needed for muscle spasm. 90 tablet 2   DULoxetine (CYMBALTA) 60 MG capsule Take 1 capsule by mouth twice daily 180 capsule 1   finasteride (PROSCAR) 5 MG tablet Take 1 tablet by mouth once daily 90 tablet 1   fluticasone furoate-vilanterol (BREO ELLIPTA) 100-25 MCG/ACT AEPB INHALE 1 PUFF BY MOUTH ONCE DAILY 60 each 2   gabapentin (NEURONTIN) 400  MG capsule TAKE 1 CAPSULE BY MOUTH THREE TIMES DAILY 270 capsule 1   ipratropium-albuterol (DUONEB) 0.5-2.5 (3) MG/3ML SOLN USE 1 AMPULE IN NEBULIZER 4 TIMES DAILY 360 mL 1   Lancets (ONETOUCH DELICA PLUS [id number]) MISC USE 1  TO CHECK GLUCOSE THREE TIMES DAILY 100 each 0   metFORMIN (GLUCOPHAGE) 1000 MG tablet Take 1,000 mg by mouth 2 (two) times daily with a meal.     MYRBETRIQ 25 MG TB24 tablet Take 1 tablet by mouth once daily 30 tablet 0   nitroGLYCERIN (NITROSTAT) 0.4 MG SL tablet DISSOLVE ONE TABLET UNDER THE TONGUE EVERY 5 TO 10 MINUTES PRIOR TO ACTIVITIES WHICH MIGHT PRECIPITATE AN ATTACK 25 tablet 0   nystatin cream (MYCOSTATIN) Apply 1 application topically 2 (two) times daily as needed for dry skin.     Omega-3 Fatty Acids (FISH OIL) 1000 MG CAPS Take 2 capsules (2,000 mg total) by mouth in the morning and at bedtime. 180 capsule 12   omeprazole (PRILOSEC) 20 MG capsule Take 1 capsule by mouth once daily 90 capsule 0   ONETOUCH ULTRA test strip USE 1 STRIP TO CHECK GLUCOSE TWICE DAILY 100 each 0   rosuvastatin (CRESTOR) 40 MG tablet Take 1 tablet by mouth once daily 90 tablet 1   SPIRIVA RESPIMAT 2.5 MCG/ACT AERS Inhale 2 puffs into the lungs daily. 4 g 2   tamsulosin (FLOMAX) 0.4 MG CAPS capsule TAKE 2 CAPSULES BY MOUTH ONCE DAILY AFTER  SUPPER 180 capsule 1   TOUJEO SOLOSTAR 300 UNIT/ML Solostar Pen INJECT 30 UNITS SUBCUTANEOUSLY ONCE DAILY 6 mL 3   traZODone (DESYREL) 150 MG tablet Take 1 tablet by mouth once daily 90 tablet 0   VENTOLIN HFA 108 (90 Base) MCG/ACT inhaler Inhale 2 puffs into the lungs every 6 (six) hours as needed for shortness of breath or wheezing. 18 g 3   No current facility-administered medications on file prior to visit.   Past Medical History:  Diagnosis Date   Absence of bladder continence 01/08/2022   Acute bilateral low back pain without sciatica 01/08/2022   Anxiety state 02/02/2022   Arthritis    Asymptomatic LV dysfunction 10/18/2017   B12 deficiency  anemia 08/25/2021   Basal cell carcinoma    Benign prostatic hyperplasia with incomplete bladder emptying 01/14/2019   Blood in ear canal, left 05/13/2021   Bone pain 03/08/2020   Cancer ([place])    throat - 1997, throat - 2018   Cardiomyopathy, secondary ([place]) 12/01/2016   Overview:  EF 47% 12/26/16   Chest pain syndrome 11/02/2017   Chronic coronary artery disease 10/18/2017   Chronic cough 05/16/2022   Chronic ischemic heart disease 11/13/1998   Jan 22, 2003 Entered By: Marland Kitchen J Comment:  massive Mi in 2000 per patient hsitory, 2nd Mi in 2001Mar 11, 2004 Entered By: Marland Kitchen J Comment: Had stent placedin 2000,cath/angio in 2001   Chronic neck pain 05/16/2022   Chronic respiratory failure with hypoxia ([place]) 05/16/2022   CKD (chronic kidney disease) stage 3, GFR 30-59 ml/min (HCC) 07/25/2019   COPD (chronic obstructive pulmonary disease) ([place])    Coronary artery disease    Coronary artery disease involving native coronary artery of native heart with angina pectoris ([place]) 12/01/2016   Overview:  He has hx of IWMI in remote past, last cath in 2012 at [facility] showed chronic total occlusion of previously stented RCA, with good collaterals, a 40% LAD stenosis, inferior hypokinesis and EF 45%.    He's been lost to Cardiology f/u since 2015, but has not had any recurrent events   Deficiency anemia 08/25/2021   Depression    Diabetes mellitus ([place]) 03/08/2020   Diabetic polyneuropathy associated with type 2 diabetes mellitus ([place]) 05/16/2022   Diaphoresis 05/16/2022   Diverticulitis 05/02/2020   Dyslipidemia 12/01/2016   Emphysema lung ([place])    Erectile dysfunction due to diseases classified elsewhere 06/12/2019   Essential hypertension 10/18/2017   GERD (gastroesophageal reflux disease)    Hoarseness 11/15/2016   Hyperlipidemia    Hypertension    Insomnia 01/28/2021   Iron deficiency anemia due to chronic blood loss 08/25/2021   Laceration of thumb, left 12/17/2015   Left flank pain 03/14/2022    Left lower quadrant abdominal tenderness without rebound tenderness 03/14/2022   Lesion of vocal cord    Leukoplakia of larynx 11/15/2016   Long-term use of aspirin therapy 10/02/2018   Malfunction of penile prosthesis ([place]) 01/14/2019   Malignant neoplasm of skin 01/28/2021   Formatting of this note might be different from the original. Jan 22, 2003 Entered By: Marland Kitchen J Comment: of skin - removed x2 inpast Jan 22, 2003 Entered By: Marland Kitchen J Comment: of skin - removed x2 inpast   Melanoma of back ([place])    melanoma on back   Memory loss 03/08/2020   Mood disorder in conditions classified elsewhere 02/02/2022   Myalgia 03/08/2020   Myocardial infarction [facility]) 2000   2 stents   Obstructive chronic bronchitis with exacerbation ([place])  05/16/2022   Oropharyngeal dysphagia 08/12/2018   Other ill-defined and unknown causes of morbidity and mortality 11/13/1993   Formatting of this note might be different from the original. Jan 31, 2008 Entered By: ARMOUR,ROSS B Comment: lumbar, 8/08 Jan 22, 2003 Entered By: Marland Kitchen J Comment: x42yr in work place  quit [id number] in 2000 Jan 22, 2003 Entered By: MAMarland Kitchen Comment: [id number] work place  quit [id number] in 2000 Jan 31, 2008 Entered By: ARMOStephannie Petersomment: lumbar, 8/08   PAD (peripheral artery disease) (HCC)[place]/04/2017   Peripheral vascular disease (HCC)[place] iliac artery clot   Peyronie's disease 06/12/2019   Pharyngoesophageal dysphagia 08/12/2018   Pneumonia 02/2015   Pneumothorax 06/07/2020   Preoperative clearance 04/03/2018   Squamous cell carcinoma of larynx (HCC)[place]/31/2018   Testicular pain, left 01/14/2019   Throat cancer (HCC)[place] Tobacco use disorder 06/12/2019   Past Surgical History:  Procedure Laterality Date   ABDOMINAL ANGIOGRAM N/A 01/13/2015   Procedure: ABDOMINAL ANGIOGRAM;  Surgeon: ToddRosetta Posner;  [facility];  Service: Cardiovascular;  Laterality: N/A;   APPENDECTOMY     BACK SURGERY     CARDIAC  CATHETERIZATION  2000/2012   with stents in [place] bilateral   ESOPHAGOGASTRODUODENOSCOPY     KNEE SURGERY Left    MICROLARYNGOSCOPY WITH CO2 LASER AND EXCISION OF VOCAL CORD LESION N/A 11/23/2016   Procedure: MICROLARYNGOSCOPY  AND EXCISION OF VOCAL CORD LESION;  Surgeon: JohnMelissa Montane;  [facility] ENT;  Laterality: N/A;   MICROLARYNGOSCOPY WITH CO2 LASER AND EXCISION OF VOCAL CORD LESION N/A 01/11/2017   Procedure: MICROLARYNGOSCOPY WITH CO2 LASER AND EXCISION OF VOCAL CORD LESION;  Surgeon: JohnMelissa Montane;  Location: [facility]: ENT;  Laterality: N/A;   MICROLARYNGOSCOPY WITH LASER N/A 03/16/2015   Procedure: MICROLARYNGOSCOPY ;  Surgeon: JohnMelissa Montane;  [facility]  Service: ENT;  Laterality: N/A;   peyronie's surgery     SHOULDER SURGERY Right    rotator cuff   SPINE SURGERY  09/2020   cervical surgery. steel plate, bone spurs, allograft.   THROAT SURGERY  1997   cancer removed    Family History  Problem Relation Age of Onset   Cancer Mother        bone   Hypertension Mother    Stroke Father    Hypertension Father    Healthy Child    Cancer Other        brain   Diabetes Other    Social History   Socioeconomic History   Marital status: Married    Spouse name: Not on file   Number of children: 4   Years of education: Not on file   Highest education level: GED or equivalent  Occupational History   Occupation: retired  Tobacco Use   Smoking status: Every Day    Packs/day: 0.50    Years: 50.00    Total pack years: 25.00    Types: Cigarettes   Smokeless tobacco: Never   Tobacco comments:    down to .5 ppd  Vaping Use   Vaping Use: Former  Substance and Sexual Activity   Alcohol use: No    Alcohol/week: 0.0 standard drinks of alcohol   Drug use: No   Sexual activity: Not on file  Other Topics Concern   Not  on file  Social History Narrative   Lives with wife       One level       Right hand   Social Determinants of Health   Financial Resource Strain: Low Risk  (05/17/2022)   Overall Financial Resource Strain (CARDIA)    Difficulty of Paying Living Expenses: Not hard at all  Food Insecurity: Not on file  Transportation Needs: No Transportation Needs (05/17/2022)   PRAPARE - Hydrologist (Medical): No    Lack of Transportation (Non-Medical): No  Physical Activity: Not on file  Stress: Not on file  Social Connections: Not on file    Review of Systems  Constitutional:  Negative for chills and fever.  HENT:  Negative for congestion, rhinorrhea and sore throat.   Respiratory:  Positive for cough, shortness of breath and wheezing.   Cardiovascular:  Negative for chest pain, palpitations and leg swelling.  Gastrointestinal:  Positive for abdominal pain and constipation. Negative for diarrhea, nausea and vomiting.  Genitourinary:  Negative for dysuria and urgency.  Musculoskeletal:  Positive for back pain. Negative for arthralgias and myalgias.  Neurological:  Negative for dizziness, weakness and headaches.  Psychiatric/Behavioral:  Negative for dysphoric mood. The patient is not nervous/anxious.      Objective:  BP 122/72   Pulse 67   Temp (!) 96.9 F (36.1 C)   Resp 18   Wt 198 lb (89.8 kg)   SpO2 93%   BMI 28.41 kg/m      06/02/2022    3:54 PM 05/24/2022    7:32 AM 05/19/2022    8:57 AM  BP/Weight  Systolic BP 161  096  Diastolic BP 72  84  Wt. (Lbs) 198 198 198  BMI 28.41 kg/m2 28.41 kg/m2 28.41 kg/m2    Physical Exam Constitutional:      Appearance: Normal appearance.  HENT:     Right Ear: Tympanic membrane, ear canal and external ear normal.     Left Ear: Ear canal and external ear normal.     Ears:     Comments: Left TM erythema.    Nose: Nose normal. No congestion or rhinorrhea.     Mouth/Throat:     Mouth: Mucous membranes are moist.     Pharynx: No oropharyngeal exudate or posterior oropharyngeal  erythema.  Cardiovascular:     Rate and Rhythm: Normal rate and regular rhythm.     Heart sounds: Normal heart sounds.  Pulmonary:     Effort: Pulmonary effort is normal. No respiratory distress.     Breath sounds: Normal breath sounds. No wheezing, rhonchi or rales.  Abdominal:     General: Bowel sounds are normal.     Palpations: Abdomen is soft.     Tenderness: There is no abdominal tenderness.  Musculoskeletal:        General: Tenderness (lumbar mid line.) present.  Lymphadenopathy:     Cervical: No cervical adenopathy.  Neurological:     Mental Status: He is alert.  Psychiatric:        Mood and Affect: Mood normal.        Behavior: Behavior normal.     Diabetic Foot Exam - Simple   Simple Foot Form  06/02/2022 10:00 PM  Visual Inspection No deformities, no ulcerations, no other skin breakdown bilaterally: Yes Sensation Testing Intact to touch and monofilament testing bilaterally: Yes Pulse Check Posterior Tibialis and Dorsalis pulse intact bilaterally: Yes Comments      Lab Results  Component Value Date   WBC 9.8 05/19/2022   HGB 13.1 05/19/2022   HCT 39.3 05/19/2022   PLT 212 05/19/2022   GLUCOSE 210 (H) 05/19/2022   CHOL 117 05/19/2022   TRIG 481 (H) 05/19/2022   HDL 23 (L) 05/19/2022   LDLCALC 27 05/19/2022   ALT 20 05/19/2022   AST 18 05/19/2022   NA 142 05/19/2022   K 4.6 05/19/2022   CL 101 05/19/2022   CREATININE 0.99 05/19/2022   BUN 10 05/19/2022   CO2 27 05/19/2022   TSH 2.630 12/16/2020   INR 1.05 03/16/2015   HGBA1C 7.9 (H) 05/19/2022   MICROALBUR 30 05/23/2021      Assessment & Plan:   Problem List Items Addressed This Visit       Cardiovascular and Mediastinum   Chronic coronary artery disease    The current medical regimen is effective;  continue present plan and medications.         Respiratory   COPD (chronic obstructive pulmonary disease) (HCC)    Continue albuterol. Continue spiriva.       Relevant Medications    azithromycin (ZITHROMAX) 250 MG tablet   Chronic respiratory failure with hypoxia (HCC)    Continue to use oxygen 2 L daily.        Endocrine   Diabetic polyneuropathy associated with type 2 diabetes mellitus (HCC)    Control: was not at goal Recommend check sugars fasting daily. Recommend check feet daily. Recommend annual eye exams. Medicines: increased metformin to 1000 mg one twice daily  Continue to work on eating a healthy diet and exercise.  Labs reviewed.        Relevant Orders   Microalbumin / creatinine urine ratio (Completed)     Nervous and Auditory   Acute bacterial otitis media, left - Primary    Rx: zpack.      Relevant Medications   azithromycin (ZITHROMAX) 250 MG tablet     Other   Hyperlipidemia    Uncontrolled.  Continue crestor 40 mg before bed.  Recommend try fish oil (try freezing it.)      Tobacco use disorder    Recommend cessation      Insomnia    Continue trazodone.       Chronic pain syndrome    See above.  Keep appt with pain management.  He currently has a pain management contract with me.       Relevant Medications   oxyCODONE-acetaminophen (PERCOCET/ROXICET) 5-325 MG tablet   Lumbar back pain    Change hydrocodone to oxycodone/apap 5/325 mg one four times a day as needed severe pain.      Relevant Medications   oxyCODONE-acetaminophen (PERCOCET/ROXICET) 5-325 MG tablet  .  Meds ordered this encounter  Medications   azithromycin (ZITHROMAX) 250 MG tablet    Sig: 2 DAILY FOR FIRST DAY, THEN DECREASE TO ONE DAILY FOR 4 MORE DAYS.    Dispense:  6 tablet    Refill:  0   oxyCODONE-acetaminophen (PERCOCET/ROXICET) 5-325 MG tablet    Sig: Take 1 tablet by mouth every 6 (six) hours as needed for severe pain.    Dispense:  120 tablet    Refill:  0    Orders Placed This Encounter  Procedures   Microalbumin / creatinine urine ratio    I,Artavius Stearns,acting as a scribe for [name], MD.,have documented all relevant  documentation on the behalf of [name], MD,as directed by  [name], MD while in the presence of  [name], MD.   Follow-up: Return in about 4 weeks (around 06/30/2022) for chronic follow up.  An After Visit Summary was printed and given to the patient.  [name], MD [facility] [phone]

## 2022-06-04 DIAGNOSIS — M545 Low back pain, unspecified: Secondary | ICD-10-CM

## 2022-06-04 DIAGNOSIS — G894 Chronic pain syndrome: Secondary | ICD-10-CM

## 2022-06-04 DIAGNOSIS — H6692 Otitis media, unspecified, left ear: Secondary | ICD-10-CM | POA: Insufficient documentation

## 2022-06-04 HISTORY — DX: Low back pain, unspecified: M54.50

## 2022-06-04 HISTORY — DX: Chronic pain syndrome: G89.4

## 2022-06-04 NOTE — Assessment & Plan Note (Signed)
Continue to use oxygen 2 L daily.

## 2022-06-04 NOTE — Assessment & Plan Note (Signed)
Continue albuterol. Continue spiriva.

## 2022-06-04 NOTE — Assessment & Plan Note (Signed)
The current medical regimen is effective;  continue present plan and medications.  

## 2022-06-04 NOTE — Assessment & Plan Note (Signed)
Rx: zpack.

## 2022-06-04 NOTE — Assessment & Plan Note (Signed)
Change hydrocodone to oxycodone/apap 5/325 mg one four times a day as needed severe pain.

## 2022-06-04 NOTE — Assessment & Plan Note (Signed)
Continue trazodone 

## 2022-06-04 NOTE — Assessment & Plan Note (Signed)
Recommend cessation. ?

## 2022-06-04 NOTE — Assessment & Plan Note (Signed)
Uncontrolled.  Continue crestor 40 mg before bed.  Recommend try fish oil (try freezing it.)

## 2022-06-04 NOTE — Assessment & Plan Note (Signed)
See above.  Keep appt with pain management.  He currently has a pain management contract with me.

## 2022-06-04 NOTE — Assessment & Plan Note (Signed)
Control: was not at goal Recommend check sugars fasting daily. Recommend check feet daily. Recommend annual eye exams. Medicines: increased metformin to 1000 mg one twice daily  Continue to work on eating a healthy diet and exercise.  Labs reviewed.

## 2022-06-05 ENCOUNTER — Other Ambulatory Visit: Payer: Self-pay | Admitting: Physician Assistant

## 2022-06-05 DIAGNOSIS — E1142 Type 2 diabetes mellitus with diabetic polyneuropathy: Secondary | ICD-10-CM | POA: Diagnosis not present

## 2022-06-06 ENCOUNTER — Ambulatory Visit: Payer: Medicare Other | Admitting: Adult Health

## 2022-06-06 ENCOUNTER — Telehealth: Payer: Self-pay

## 2022-06-06 DIAGNOSIS — R0602 Shortness of breath: Secondary | ICD-10-CM | POA: Diagnosis not present

## 2022-06-06 DIAGNOSIS — R531 Weakness: Secondary | ICD-10-CM | POA: Diagnosis not present

## 2022-06-06 DIAGNOSIS — Z20822 Contact with and (suspected) exposure to covid-19: Secondary | ICD-10-CM | POA: Diagnosis not present

## 2022-06-06 DIAGNOSIS — R509 Fever, unspecified: Secondary | ICD-10-CM | POA: Diagnosis not present

## 2022-06-06 DIAGNOSIS — B029 Zoster without complications: Secondary | ICD-10-CM | POA: Diagnosis not present

## 2022-06-06 LAB — MICROALBUMIN / CREATININE URINE RATIO
Creatinine, Urine: 30.3 mg/dL
Microalb/Creat Ratio: 11 mg/g creat (ref 0–29)
Microalbumin, Urine: 3.3 ug/mL

## 2022-06-06 NOTE — Progress Notes (Signed)
Diaphoresis ADR's: Duloxetine: 6% Metoprolol: Unknown rate Omeprazole: Unknown rate Trazodone: Potentiate serotonergic mechanism leading to diaphoresis

## 2022-06-06 NOTE — Telephone Encounter (Signed)
Anne Ng called to report that Don [name] is having excessive sweating which is worse at night.  He also is having symptoms in the morning. His blood sugar and blood pressure is good.  Pulse oximetry is 94-95%.  His oxygen level has been doing well but he is a little more short of breath.  His only medication change has been oxycodone.  He is scheduled to be seen today with pulmonary.  Dr. Tobie Poet was informed and she is going to have the Pharmacist review his medications.

## 2022-06-07 NOTE — Progress Notes (Signed)
Let them know to cut Trazodone in half per PCP  While talking, they told me patient went to ER last night for rash, confusion, Shingles, and a possible "Mini-stroke." Let PCP know to start TOC

## 2022-06-08 ENCOUNTER — Ambulatory Visit: Payer: Medicare Other | Admitting: Primary Care

## 2022-06-08 NOTE — Progress Notes (Deleted)
$'@Patient'X$  ID: Don [name], male    DOB: September 02, 1951, 71 y.o.   MRN: [id number]  No chief complaint on file.   Referring provider: [name], MD  HPI: 71 year old male, current smoker. PMH significant for COPD, squamous cell carcinoma of larynx, lung nodules, CAD, hypertension, dysphagia. Patient of Dr. Chase Caller, last seen 08/12/18. CT chest in 2019 showed chronic stable pulmonary nodules. Breo/Spiriva changed to Trelegy. Encouraged smoking cessation. Referred to ENT for dysphagia. Recommended 6 week follow-up.    Previous [facility] pulmonary encounter:  09/16/2019 Patient contacted today for televisit and accompanied by his wife. Last seen 1 year ago. Reports shortness of breath and prod cough x 3 weeks. He has been treated with prednisone taper, Azithromycin and Levaquin per PCP. Wife states that he has improved some but still has significant cough. Reports that he chokes on his mucus and has frequent coughing fits. He has been taking mucinex for the last several weeks. He remains on Breo and Spiriva as prescribed, Trelegy was not affordable. Wife states that his PCP ordered CXR which he was unable to do because he needs to be COVID tested prior.   COPD exacerbation - Prolonged bronchitis symptoms despite completing prednisone, Zpack and Levaquin - Continue Breo 1 puff daily and Spiriva  - Needs additional prednisone taper - Advised Mucinex 1,'200mg'$  twice daily  - Tussionex cough syrup 67m twice a day for cough (do not combine with other sedating/opioid medications) - Check sputum sample (avoiding further antibiotics until culture and CXR) - Recommend covid testing  06/08/2022- interim hx  Patient presents today for OV follow-up/ COPD and emphysema. Last seen for virtual televisit in November 2020 for COPD exacerbation treated with prednisone taper. Maintained on BREO 1020m and Spiriva Respimat 2.39m62m Uses oxygen at night   He has a chronic cough. PCP ordered CT chest.   Allergies   Allergen Reactions   Penicillins Rash and Hives    Has patient had a PCN reaction causing immediate rash, facial/tongue/throat swelling, SOB or lightheadedness with hypotension:unsure Has patient had a PCN reaction causing severe rash involving mucus membranes or skin necrosis:unsure Has patient had a PCN reaction that required hospitalization:No Has patient had a PCN reaction occurring within the last 10 years:No If all of the above answers are "NO", then may proceed with Cephalosporin use.  rash   Prednisone     Depressed mood - can tolerate it, but it makes him very irritable   Doxycycline Rash   Iodinated Contrast Media Rash    Also developed blisters Also developed blisters   Penicillin G Rash   Tramadol Rash    Immunization History  Administered Date(s) Administered   Fluad Quad(high Dose 65+) 08/16/2020, 09/01/2021   H1N1 09/13/2008   Influenza Split 09/01/2021   Influenza, High Dose Seasonal PF 10/13/2017, 08/26/2019, 08/16/2020   Influenza-Unspecified 11/13/2002, 08/19/2004, 09/13/2004, 09/13/2006, 10/17/2007, 08/12/2008, 10/13/2017, 08/26/2019   Moderna SARS-COV2 Booster Vaccination 10/12/2020   Moderna Sars-Covid-2 Vaccination 10/12/2020   Pneumococcal Conjugate-13 03/13/2000, 10/01/2015   Pneumococcal Polysaccharide-23 11/13/2002, 08/13/2009   Pneumococcal-Unspecified 03/13/2000   Td 11/13/1996    Past Medical History:  Diagnosis Date   Absence of bladder continence 01/08/2022   Acute bilateral low back pain without sciatica 01/08/2022   Anxiety state 02/02/2022   Arthritis    Asymptomatic LV dysfunction 10/18/2017   B12 deficiency anemia 08/25/2021   Basal cell carcinoma    Benign prostatic hyperplasia with incomplete bladder emptying 01/14/2019   Blood in ear canal, left 05/13/2021  Bone pain 03/08/2020   Cancer ([place])    throat - 1997, throat - 2018   Cardiomyopathy, secondary ([place]) 12/01/2016   Overview:  EF 47% 12/26/16   Chest pain syndrome 11/02/2017    Chronic coronary artery disease 10/18/2017   Chronic cough 05/16/2022   Chronic ischemic heart disease 11/13/1998   Jan 22, 2003 Entered By: Marland Kitchen J Comment:  massive Mi in 2000 per patient hsitory, 2nd Mi in 2001Mar 11, 2004 Entered By: Jani Files Comment: Had stent placedin 2000,cath/angio in 2001   Chronic neck pain 05/16/2022   Chronic respiratory failure with hypoxia ([place]) 05/16/2022   CKD (chronic kidney disease) stage 3, GFR 30-59 ml/min (HCC) 07/25/2019   COPD (chronic obstructive pulmonary disease) ([place])    Coronary artery disease    Coronary artery disease involving native coronary artery of native heart with angina pectoris ([place]) 12/01/2016   Overview:  He has hx of IWMI in remote past, last cath in 2012 at [facility] showed chronic total occlusion of previously stented RCA, with good collaterals, a 40% LAD stenosis, inferior hypokinesis and EF 45%.    He's been lost to Cardiology f/u since 2015, but has not had any recurrent events   Deficiency anemia 08/25/2021   Depression    Diabetes mellitus ([place]) 03/08/2020   Diabetic polyneuropathy associated with type 2 diabetes mellitus ([place]) 05/16/2022   Diaphoresis 05/16/2022   Diverticulitis 05/02/2020   Dyslipidemia 12/01/2016   Emphysema lung ([place])    Erectile dysfunction due to diseases classified elsewhere 06/12/2019   Essential hypertension 10/18/2017   GERD (gastroesophageal reflux disease)    Hoarseness 11/15/2016   Hyperlipidemia    Hypertension    Insomnia 01/28/2021   Iron deficiency anemia due to chronic blood loss 08/25/2021   Laceration of thumb, left 12/17/2015   Left flank pain 03/14/2022   Left lower quadrant abdominal tenderness without rebound tenderness 03/14/2022   Lesion of vocal cord    Leukoplakia of larynx 11/15/2016   Long-term use of aspirin therapy 10/02/2018   Malfunction of penile prosthesis ([place]) 01/14/2019   Malignant neoplasm of skin 01/28/2021   Formatting of this note might be different from the  original. Jan 22, 2003 Entered By: Marland Kitchen J Comment: of skin - removed x2 inpast Jan 22, 2003 Entered By: Marland Kitchen J Comment: of skin - removed x2 inpast   Melanoma of back ([place])    melanoma on back   Memory loss 03/08/2020   Mood disorder in conditions classified elsewhere 02/02/2022   Myalgia 03/08/2020   Myocardial infarction [facility]) 2000   2 stents   Obstructive chronic bronchitis with exacerbation ([place]) 05/16/2022   Oropharyngeal dysphagia 08/12/2018   Other ill-defined and unknown causes of morbidity and mortality 11/13/1993   Formatting of this note might be different from the original. Jan 31, 2008 Entered By: ARMOUR,ROSS B Comment: lumbar, 8/08 Jan 22, 2003 Entered By: Marland Kitchen J Comment: [id number] in work place  quit [id number] in 2000 Jan 22, 2003 Entered By: MAMarland Kitchen Comment: [id number] work place  quit [id number] in 2000 Jan 31, 2008 Entered By: ARMOStephannie Petersomment: lumbar, 8/08   PAD (peripheral artery disease) (HCC)[place]/04/2017   Peripheral vascular disease (HCC)[place] iliac artery clot   Peyronie's disease 06/12/2019   Pharyngoesophageal dysphagia 08/12/2018   Pneumonia 02/2015   Pneumothorax 06/07/2020   Preoperative clearance 04/03/2018   Squamous cell carcinoma of larynx (HCC)[place]/31/2018   Testicular pain, left 01/14/2019   Throat cancer (HCC)[place]  Tobacco use disorder 06/12/2019    Tobacco History: Social History   Tobacco Use  Smoking Status Every Day   Packs/day: 0.50   Years: 50.00   Total pack years: 25.00   Types: Cigarettes  Smokeless Tobacco Never  Tobacco Comments   down to .5 ppd   Ready to quit: Not Answered Counseling given: Not Answered Tobacco comments: down to .5 ppd   Outpatient Medications Prior to Visit  Medication Sig Dispense Refill   ALPRAZolam (XANAX) 0.25 MG tablet TAKE 1 TABLET BY MOUTH ONCE DAILY AS NEEDED FOR ANXIETY 30 tablet 1   azithromycin (ZITHROMAX) 250 MG tablet 2 DAILY FOR FIRST DAY, THEN DECREASE TO ONE DAILY FOR  4 MORE DAYS. 6 tablet 0   clopidogrel (PLAVIX) 75 MG tablet Take 1 tablet by mouth once daily 90 tablet 2   cyclobenzaprine (FLEXERIL) 10 MG tablet One three times a day as needed for muscle spasm. 90 tablet 2   DULoxetine (CYMBALTA) 60 MG capsule Take 1 capsule by mouth twice daily 180 capsule 1   finasteride (PROSCAR) 5 MG tablet Take 1 tablet by mouth once daily 90 tablet 1   fluticasone furoate-vilanterol (BREO ELLIPTA) 100-25 MCG/ACT AEPB INHALE 1 PUFF BY MOUTH ONCE DAILY 60 each 2   gabapentin (NEURONTIN) 400 MG capsule TAKE 1 CAPSULE BY MOUTH THREE TIMES DAILY 270 capsule 1   ipratropium-albuterol (DUONEB) 0.5-2.5 (3) MG/3ML SOLN USE 1 AMPULE IN NEBULIZER 4 TIMES DAILY 360 mL 1   Lancets (ONETOUCH DELICA PLUS [id number]) MISC USE 1  TO CHECK GLUCOSE THREE TIMES DAILY 100 each 0   metFORMIN (GLUCOPHAGE) 1000 MG tablet Take 1,000 mg by mouth 2 (two) times daily with a meal.     metoprolol tartrate (LOPRESSOR) 50 MG tablet Take 1 tablet by mouth twice daily 180 tablet 0   MYRBETRIQ 25 MG TB24 tablet Take 1 tablet by mouth once daily 30 tablet 0   nitroGLYCERIN (NITROSTAT) 0.4 MG SL tablet DISSOLVE ONE TABLET UNDER THE TONGUE EVERY 5 TO 10 MINUTES PRIOR TO ACTIVITIES WHICH MIGHT PRECIPITATE AN ATTACK 25 tablet 0   nystatin cream (MYCOSTATIN) Apply 1 application topically 2 (two) times daily as needed for dry skin.     Omega-3 Fatty Acids (FISH OIL) 1000 MG CAPS Take 2 capsules (2,000 mg total) by mouth in the morning and at bedtime. 180 capsule 12   omeprazole (PRILOSEC) 20 MG capsule Take 1 capsule by mouth once daily 90 capsule 0   ONETOUCH ULTRA test strip USE 1 STRIP TO CHECK GLUCOSE TWICE DAILY 100 each 0   oxyCODONE-acetaminophen (PERCOCET/ROXICET) 5-325 MG tablet Take 1 tablet by mouth every 6 (six) hours as needed for severe pain. 120 tablet 0   rosuvastatin (CRESTOR) 40 MG tablet Take 1 tablet by mouth once daily 90 tablet 1   SPIRIVA RESPIMAT 2.5 MCG/ACT AERS Inhale 2 puffs into the  lungs daily. 4 g 2   tamsulosin (FLOMAX) 0.4 MG CAPS capsule TAKE 2 CAPSULES BY MOUTH ONCE DAILY AFTER SUPPER 180 capsule 1   TOUJEO SOLOSTAR 300 UNIT/ML Solostar Pen INJECT 30 UNITS SUBCUTANEOUSLY ONCE DAILY 6 mL 3   traZODone (DESYREL) 150 MG tablet Take 1 tablet by mouth once daily 90 tablet 0   VENTOLIN HFA 108 (90 Base) MCG/ACT inhaler Inhale 2 puffs into the lungs every 6 (six) hours as needed for shortness of breath or wheezing. 18 g 3   No facility-administered medications prior to visit.      Review of Systems  Review of Systems   Physical Exam  There were no vitals taken for this visit. Physical Exam   Lab Results:  CBC    Component Value Date/Time   WBC 9.8 05/19/2022 0823   WBC 12.2 (H) 01/10/2017 0841   RBC 4.70 05/19/2022 0823   RBC 4.85 12/15/2021 0000   HGB 13.1 05/19/2022 0823   HCT 39.3 05/19/2022 0823   PLT 212 05/19/2022 0823   MCV 84 05/19/2022 0823   MCV 75 (A) 08/25/2021 0000   MCH 27.9 05/19/2022 0823   MCH 31.7 01/10/2017 0841   MCHC 33.3 05/19/2022 0823   MCHC 34.6 01/10/2017 0841   RDW 14.7 05/19/2022 0823   LYMPHSABS 3.0 05/19/2022 0823   EOSABS 0.3 05/19/2022 0823   BASOSABS 0.1 05/19/2022 0823    BMET    Component Value Date/Time   NA 142 05/19/2022 0823   K 4.6 05/19/2022 0823   CL 101 05/19/2022 0823   CO2 27 05/19/2022 0823   GLUCOSE 210 (H) 05/19/2022 0823   GLUCOSE 125 (H) 01/10/2017 0841   BUN 10 05/19/2022 0823   CREATININE 0.99 05/19/2022 0823   CALCIUM 9.1 05/19/2022 0823   GFRNONAA 87 12/16/2020 0913   GFRAA 100 12/16/2020 0913    BNP No results found for: "BNP"  ProBNP No results found for: "PROBNP"  Imaging: MYOCARDIAL PERFUSION IMAGING  Result Date: 05/24/2022   The study is normal. The study is intermediate risk.   LV perfusion is abnormal. There is no evidence of ischemia. There is evidence of infarction. Defect 1: There is a medium defect with moderate reduction in uptake present in the inferior  location(s) that is fixed. There is abnormal wall motion in the defect area. Consistent with infarction.   Left ventricular function is abnormal. Global function is mildly reduced. Nuclear stress EF: 49 %. The left ventricular ejection fraction is mildly decreased (45-54%). End diastolic cavity size is normal.   Prior study not available for comparison.     Assessment & Plan:   No problem-specific Assessment & Plan notes found for this encounter.     Martyn Ehrich, NP 06/08/2022

## 2022-06-08 NOTE — Telephone Encounter (Signed)
This encounter was created in error - please disregard.

## 2022-06-12 DIAGNOSIS — E782 Mixed hyperlipidemia: Secondary | ICD-10-CM

## 2022-06-12 DIAGNOSIS — I10 Essential (primary) hypertension: Secondary | ICD-10-CM | POA: Diagnosis not present

## 2022-06-12 DIAGNOSIS — E1142 Type 2 diabetes mellitus with diabetic polyneuropathy: Secondary | ICD-10-CM

## 2022-06-14 ENCOUNTER — Telehealth: Payer: Self-pay | Admitting: Family Medicine

## 2022-06-14 NOTE — Telephone Encounter (Signed)
Pt wife called in stating he has a hospital follow up on 10th but now he is broke out with the shingles that his other doctors have canceled their appts. She asked if he can be seen before 10th if possible.

## 2022-06-14 NOTE — Telephone Encounter (Signed)
Wife called because his shingles rash has worsened. He has had for over a week. He is on acyclovir. Completing it. Asking what he should take for pain.  Recommended salon pas (lidoderm patches.)  Too late for prednisone to help.  May increase gabapentin to 400 mg four times a day. May need new rx if she does this.  Keep hospital follow up on 10th.  Dr. Tobie Poet

## 2022-06-14 NOTE — Telephone Encounter (Signed)
See note. Dr. Tobie Poet

## 2022-06-16 DIAGNOSIS — J9610 Chronic respiratory failure, unspecified whether with hypoxia or hypercapnia: Secondary | ICD-10-CM | POA: Diagnosis not present

## 2022-06-16 DIAGNOSIS — J449 Chronic obstructive pulmonary disease, unspecified: Secondary | ICD-10-CM | POA: Diagnosis not present

## 2022-06-22 ENCOUNTER — Ambulatory Visit (INDEPENDENT_AMBULATORY_CARE_PROVIDER_SITE_OTHER): Payer: Medicare Other | Admitting: Family Medicine

## 2022-06-22 ENCOUNTER — Encounter: Payer: Self-pay | Admitting: Family Medicine

## 2022-06-22 VITALS — BP 118/62 | HR 84 | Temp 98.0°F | Ht 70.0 in | Wt 194.0 lb

## 2022-06-22 DIAGNOSIS — L89212 Pressure ulcer of right hip, stage 2: Secondary | ICD-10-CM | POA: Diagnosis not present

## 2022-06-22 DIAGNOSIS — H6122 Impacted cerumen, left ear: Secondary | ICD-10-CM | POA: Insufficient documentation

## 2022-06-22 MED ORDER — CEPHALEXIN 500 MG PO CAPS: 500.0000 mg | ORAL_CAPSULE | Freq: Three times a day (TID) | ORAL | 0 refills | Status: DC

## 2022-06-22 NOTE — Assessment & Plan Note (Signed)
Unable to loop.  Recommended debrox.  May come back in one week if needed.

## 2022-06-22 NOTE — Progress Notes (Signed)
Acute Office Visit  Subjective:    Patient ID: Don [name], male    DOB: [birth year], 71 y.o.   MRN: [id number]  Chief Complaint  Patient presents with   Hospitalization Follow-up    HPI: Patient is in today for ulcer on right hip. On 06/06/2022 taken by EMS due to confusion. Rash came up within 30 minutes and then blistered in 10 minutes. Doc was brought in and he immediately dxd with shingles. Given acyclovir and rash resolved in 24 hours, but blister progressed and is now an ulcer. Very tender.   Past Medical History:  Diagnosis Date   Absence of bladder continence 01/08/2022   Acute bilateral low back pain without sciatica 01/08/2022   Anxiety state 02/02/2022   Arthritis    Asymptomatic LV dysfunction 10/18/2017   B12 deficiency anemia 08/25/2021   Basal cell carcinoma    Benign prostatic hyperplasia with incomplete bladder emptying 01/14/2019   Blood in ear canal, left 05/13/2021   Bone pain 03/08/2020   Cancer ([place])    throat - 1997, throat - 2018   Cardiomyopathy, secondary ([place]) 12/01/2016   Overview:  EF 47% 12/26/16   Chest pain syndrome 11/02/2017   Chronic coronary artery disease 10/18/2017   Chronic cough 05/16/2022   Chronic ischemic heart disease 11/13/1998   Jan 22, 2003 Entered By: Marland Kitchen J Comment:  massive Mi in 2000 per patient hsitory, 2nd Mi in 2001Mar 11, 2004 Entered By: Marland Kitchen J Comment: Had stent placedin 2000,cath/angio in 2001   Chronic neck pain 05/16/2022   Chronic respiratory failure with hypoxia ([place]) 05/16/2022   CKD (chronic kidney disease) stage 3, GFR 30-59 ml/min (HCC) 07/25/2019   COPD (chronic obstructive pulmonary disease) ([place])    Coronary artery disease    Coronary artery disease involving native coronary artery of native heart with angina pectoris ([place]) 12/01/2016   Overview:  He has hx of IWMI in remote past, last cath in 2012 at [facility] showed chronic total occlusion of previously stented RCA, with good collaterals, a 40% LAD  stenosis, inferior hypokinesis and EF 45%.    He's been lost to Cardiology f/u since 2015, but has not had any recurrent events   Deficiency anemia 08/25/2021   Depression    Diabetes mellitus ([place]) 03/08/2020   Diabetic polyneuropathy associated with type 2 diabetes mellitus ([place]) 05/16/2022   Diaphoresis 05/16/2022   Diverticulitis 05/02/2020   Dyslipidemia 12/01/2016   Emphysema lung ([place])    Erectile dysfunction due to diseases classified elsewhere 06/12/2019   Essential hypertension 10/18/2017   GERD (gastroesophageal reflux disease)    Hoarseness 11/15/2016   Hyperlipidemia    Hypertension    Insomnia 01/28/2021   Iron deficiency anemia due to chronic blood loss 08/25/2021   Laceration of thumb, left 12/17/2015   Left flank pain 03/14/2022   Left lower quadrant abdominal tenderness without rebound tenderness 03/14/2022   Lesion of vocal cord    Leukoplakia of larynx 11/15/2016   Long-term use of aspirin therapy 10/02/2018   Malfunction of penile prosthesis ([place]) 01/14/2019   Malignant neoplasm of skin 01/28/2021   Formatting of this note might be different from the original. Jan 22, 2003 Entered By: Marland Kitchen J Comment: of skin - removed x2 inpast Jan 22, 2003 Entered By: Marland Kitchen J Comment: of skin - removed x2 inpast   Melanoma of back ([place])    melanoma on back   Memory loss 03/08/2020   Mood disorder in conditions classified elsewhere 02/02/2022   Myalgia 03/08/2020  Myocardial infarction [facility]) 2000   2 stents   Obstructive chronic bronchitis with exacerbation ([place]) 05/16/2022   Oropharyngeal dysphagia 08/12/2018   Other ill-defined and unknown causes of morbidity and mortality 11/13/1993   Formatting of this note might be different from the original. Jan 31, 2008 Entered By: ARMOUR,ROSS B Comment: lumbar, 8/08 Jan 22, 2003 Entered By: Marland Kitchen J Comment: x51yr in work place  quit 370yrago in 2000 Jan 22, 2003 Entered By: MAMarland Kitchen Comment: [id number] work place   quit [id number] in 2000 Jan 31, 2008 Entered By: ARMOStephannie Petersomment: lumbar, 8/08   PAD (peripheral artery disease) (HCC)[place]/04/2017   Peripheral vascular disease (HCC)[place] iliac artery clot   Peyronie's disease 06/12/2019   Pharyngoesophageal dysphagia 08/12/2018   Pneumonia 02/2015   Pneumothorax 06/07/2020   Preoperative clearance 04/03/2018   Squamous cell carcinoma of larynx (HCC)[place]/31/2018   Testicular pain, left 01/14/2019   Throat cancer (HCC)[place] Tobacco use disorder 06/12/2019    Past Surgical History:  Procedure Laterality Date   ABDOMINAL ANGIOGRAM N/A 01/13/2015   Procedure: ABDOMINAL ANGIOGRAM;  Surgeon: ToddRosetta Posner;  [facility];  Service: Cardiovascular;  Laterality: N/A;   APPENDECTOMY     BACK SURGERY     CARDIAC CATHETERIZATION  2000/2012   with stents in [place] bilateral   ESOPHAGOGASTRODUODENOSCOPY     KNEE SURGERY Left    MICROLARYNGOSCOPY WITH CO2 LASER AND EXCISION OF VOCAL CORD LESION N/A 11/23/2016   Procedure: MICROLARYNGOSCOPY  AND EXCISION OF VOCAL CORD LESION;  Surgeon: JohnMelissa Montane;  Location: [facility]: ENT;  Laterality: N/A;   MICROLARYNGOSCOPY WITH CO2 LASER AND EXCISION OF VOCAL CORD LESION N/A 01/11/2017   Procedure: MICROLARYNGOSCOPY WITH CO2 LASER AND EXCISION OF VOCAL CORD LESION;  Surgeon: JohnMelissa Montane;  Location: [facility]ice: ENT;  Laterality: N/A;   MICROLARYNGOSCOPY WITH LASER N/A 03/16/2015   Procedure: MICROLARYNGOSCOPY ;  Surgeon: JohnMelissa Montane;  [facility]  Service: ENT;  Laterality: N/A;   peyronie's surgery     SHOULDER SURGERY Right    rotator cuff   SPINE SURGERY  09/2020   cervical surgery. steel plate, bone spurs, allograft.   THROAT SURGERY  1997   cancer removed    Family History  Problem Relation Age of Onset   Cancer Mother        bone   Hypertension Mother    Stroke Father    Hypertension Father    Healthy Child    Cancer  Other        brain   Diabetes Other     Social History   Socioeconomic History   Marital status: Married    Spouse name: Not on file   Number of children: 4   Years of education: Not on file   Highest education level: GED or equivalent  Occupational History   Occupation: retired  Tobacco Use   Smoking status: Every Day    Packs/day: 0.50    Years: 50.00    Total pack years: 25.00    Types: Cigarettes   Smokeless tobacco: Never   Tobacco comments:    down to .5 ppd  Vaping Use   Vaping Use: Former  Substance and Sexual Activity   Alcohol use: No    Alcohol/week: 0.0 standard drinks of alcohol  Drug use: No   Sexual activity: Not on file  Other Topics Concern   Not on file  Social History Narrative   Lives with wife       One level      Right hand   Social Determinants of Health   Financial Resource Strain: Low Risk  (05/17/2022)   Overall Financial Resource Strain (CARDIA)    Difficulty of Paying Living Expenses: Not hard at all  Food Insecurity: Not on file  Transportation Needs: No Transportation Needs (05/17/2022)   PRAPARE - Hydrologist (Medical): No    Lack of Transportation (Non-Medical): No  Physical Activity: Not on file  Stress: Not on file  Social Connections: Not on file  Intimate Partner Violence: Not on file    Outpatient Medications Prior to Visit  Medication Sig Dispense Refill   ALPRAZolam (XANAX) 0.25 MG tablet TAKE 1 TABLET BY MOUTH ONCE DAILY AS NEEDED FOR ANXIETY 30 tablet 1   clopidogrel (PLAVIX) 75 MG tablet Take 1 tablet by mouth once daily 90 tablet 2   cyclobenzaprine (FLEXERIL) 10 MG tablet One three times a day as needed for muscle spasm. 90 tablet 2   DULoxetine (CYMBALTA) 60 MG capsule Take 1 capsule by mouth twice daily 180 capsule 1   finasteride (PROSCAR) 5 MG tablet Take 1 tablet by mouth once daily 90 tablet 1   fluticasone furoate-vilanterol (BREO ELLIPTA) 100-25 MCG/ACT AEPB INHALE 1 PUFF BY  MOUTH ONCE DAILY 60 each 2   gabapentin (NEURONTIN) 400 MG capsule TAKE 1 CAPSULE BY MOUTH THREE TIMES DAILY 270 capsule 1   ipratropium-albuterol (DUONEB) 0.5-2.5 (3) MG/3ML SOLN USE 1 AMPULE IN NEBULIZER 4 TIMES DAILY 360 mL 1   Lancets (ONETOUCH DELICA PLUS [id number]) MISC USE 1  TO CHECK GLUCOSE THREE TIMES DAILY 100 each 0   metFORMIN (GLUCOPHAGE) 1000 MG tablet Take 1,000 mg by mouth 2 (two) times daily with a meal.     metoprolol tartrate (LOPRESSOR) 50 MG tablet Take 1 tablet by mouth twice daily 180 tablet 0   MYRBETRIQ 25 MG TB24 tablet Take 1 tablet by mouth once daily 30 tablet 0   nitroGLYCERIN (NITROSTAT) 0.4 MG SL tablet DISSOLVE ONE TABLET UNDER THE TONGUE EVERY 5 TO 10 MINUTES PRIOR TO ACTIVITIES WHICH MIGHT PRECIPITATE AN ATTACK 25 tablet 0   nystatin cream (MYCOSTATIN) Apply 1 application topically 2 (two) times daily as needed for dry skin.     Omega-3 Fatty Acids (FISH OIL) 1000 MG CAPS Take 2 capsules (2,000 mg total) by mouth in the morning and at bedtime. 180 capsule 12   omeprazole (PRILOSEC) 20 MG capsule Take 1 capsule by mouth once daily 90 capsule 0   ONETOUCH ULTRA test strip USE 1 STRIP TO CHECK GLUCOSE TWICE DAILY 100 each 0   oxyCODONE-acetaminophen (PERCOCET/ROXICET) 5-325 MG tablet Take 1 tablet by mouth every 6 (six) hours as needed for severe pain. 120 tablet 0   rosuvastatin (CRESTOR) 40 MG tablet Take 1 tablet by mouth once daily 90 tablet 1   SPIRIVA RESPIMAT 2.5 MCG/ACT AERS Inhale 2 puffs into the lungs daily. 4 g 2   tamsulosin (FLOMAX) 0.4 MG CAPS capsule TAKE 2 CAPSULES BY MOUTH ONCE DAILY AFTER SUPPER 180 capsule 1   TOUJEO SOLOSTAR 300 UNIT/ML Solostar Pen INJECT 30 UNITS SUBCUTANEOUSLY ONCE DAILY 6 mL 3   traZODone (DESYREL) 150 MG tablet Take 1 tablet by mouth once daily 90 tablet 0   VENTOLIN HFA  108 (90 Base) MCG/ACT inhaler Inhale 2 puffs into the lungs every 6 (six) hours as needed for shortness of breath or wheezing. 18 g 3   azithromycin  (ZITHROMAX) 250 MG tablet 2 DAILY FOR FIRST DAY, THEN DECREASE TO ONE DAILY FOR 4 MORE DAYS. 6 tablet 0   No facility-administered medications prior to visit.    Allergies  Allergen Reactions   Penicillins Rash and Hives    Has patient had a PCN reaction causing immediate rash, facial/tongue/throat swelling, SOB or lightheadedness with hypotension:unsure Has patient had a PCN reaction causing severe rash involving mucus membranes or skin necrosis:unsure Has patient had a PCN reaction that required hospitalization:No Has patient had a PCN reaction occurring within the last 10 years:No If all of the above answers are "NO", then may proceed with Cephalosporin use.  rash   Prednisone     Depressed mood - can tolerate it, but it makes him very irritable   Doxycycline Rash   Iodinated Contrast Media Rash    Also developed blisters Also developed blisters   Penicillin G Rash   Tramadol Rash    Review of Systems  Constitutional:  Negative for fever.  HENT:  Positive for ear pain.   Respiratory:  Negative for cough and shortness of breath.        Objective:    Physical Exam Vitals reviewed.  Constitutional:      Appearance: Normal appearance.  HENT:     Right Ear: Tympanic membrane normal.     Left Ear: Tympanic membrane normal. There is impacted cerumen (partial. hard dark inferiorly.).  Skin:    Findings: Lesion (right lateral hip - ulcer 3 cm in diameter. no drainage. erythema mildly.) present.  Neurological:     Mental Status: He is alert.     BP 118/62 (BP Location: Right Arm, Patient Position: Sitting)   Pulse 84   Temp 98 F (36.7 C) (Oral)   Ht '5\' 10"'  (1.778 m)   Wt 194 lb (88 kg)   SpO2 94%   BMI 27.84 kg/m  Wt Readings from Last 3 Encounters:  06/22/22 194 lb (88 kg)  06/02/22 198 lb (89.8 kg)  05/24/22 198 lb (89.8 kg)    Health Maintenance Due  Topic Date Due   FOOT EXAM  Never done   OPHTHALMOLOGY EXAM  Never done   Hepatitis C Screening  Never  done   Zoster Vaccines- Shingrix (1 of 2) Never done   TETANUS/TDAP  11/13/2006   Pneumonia Vaccine 91+ Years old (3 - PPSV23 or PCV20) 09/30/2016   COVID-19 Vaccine (2 - Moderna risk series) 11/09/2020   INFLUENZA VACCINE  06/13/2022    There are no preventive care reminders to display for this patient.   Lab Results  Component Value Date   TSH 2.630 12/16/2020   Lab Results  Component Value Date   WBC 9.8 05/19/2022   HGB 13.1 05/19/2022   HCT 39.3 05/19/2022   MCV 84 05/19/2022   PLT 212 05/19/2022   Lab Results  Component Value Date   NA 142 05/19/2022   K 4.6 05/19/2022   CO2 27 05/19/2022   GLUCOSE 210 (H) 05/19/2022   BUN 10 05/19/2022   CREATININE 0.99 05/19/2022   BILITOT 0.2 05/19/2022   ALKPHOS 105 05/19/2022   AST 18 05/19/2022   ALT 20 05/19/2022   PROT 6.3 05/19/2022   ALBUMIN 3.9 05/19/2022   CALCIUM 9.1 05/19/2022   ANIONGAP 7 01/10/2017   EGFR 81 05/19/2022  Lab Results  Component Value Date   CHOL 117 05/19/2022   Lab Results  Component Value Date   HDL 23 (L) 05/19/2022   Lab Results  Component Value Date   LDLCALC 27 05/19/2022   Lab Results  Component Value Date   TRIG 481 (H) 05/19/2022   Lab Results  Component Value Date   CHOLHDL 5.1 (H) 05/19/2022   Lab Results  Component Value Date   HGBA1C 7.9 (H) 05/19/2022       Assessment & Plan:   Problem List Items Addressed This Visit       Nervous and Auditory   Impacted cerumen of left ear    Unable to loop.  Recommended debrox.  May come back in one week if needed.         Other   Stage II pressure ulcer of right hip ([place]) - Primary    Recommended triple antibiotic treatment. Start on keflex 500 mg twice daily x 7 days.       Meds ordered this encounter  Medications   cephALEXin (KEFLEX) 500 MG capsule    Sig: Take 1 capsule (500 mg total) by mouth 3 (three) times daily.    Dispense:  30 capsule    Refill:  0      Follow-up: Return if symptoms worsen  or fail to improve.  An After Visit Summary was printed and given to the patient.  [name], MD [facility] [phone]

## 2022-06-22 NOTE — Assessment & Plan Note (Signed)
Recommended triple antibiotic treatment. Start on keflex 500 mg twice daily x 7 days.

## 2022-06-29 ENCOUNTER — Other Ambulatory Visit: Payer: Self-pay | Admitting: Legal Medicine

## 2022-06-29 ENCOUNTER — Other Ambulatory Visit: Payer: Self-pay | Admitting: Family Medicine

## 2022-06-30 ENCOUNTER — Telehealth: Payer: Self-pay

## 2022-06-30 DIAGNOSIS — L89212 Pressure ulcer of right hip, stage 2: Secondary | ICD-10-CM

## 2022-06-30 NOTE — Telephone Encounter (Signed)
Anne Ng called requesting a Wound Care referral on Don [name].  The wound is scabbed over but she is concerned that it is very deep and she would prefer for it to be rechecked.  Wound Care referral order sent.

## 2022-07-03 ENCOUNTER — Other Ambulatory Visit: Payer: Self-pay

## 2022-07-03 ENCOUNTER — Other Ambulatory Visit: Payer: Self-pay | Admitting: Family Medicine

## 2022-07-03 MED ORDER — METFORMIN HCL 1000 MG PO TABS: 1000.0000 mg | ORAL_TABLET | Freq: Two times a day (BID) | ORAL | 2 refills | Status: DC

## 2022-07-03 NOTE — Telephone Encounter (Signed)
Patient asked if you will send a different pain medication. Please advice.

## 2022-07-05 ENCOUNTER — Other Ambulatory Visit: Payer: Self-pay | Admitting: Family Medicine

## 2022-07-10 ENCOUNTER — Other Ambulatory Visit: Payer: Self-pay | Admitting: Family Medicine

## 2022-07-10 ENCOUNTER — Telehealth: Payer: Self-pay

## 2022-07-10 MED ORDER — OXYCODONE-ACETAMINOPHEN 5-325 MG PO TABS: 1.0000 | ORAL_TABLET | Freq: Four times a day (QID) | ORAL | 0 refills | Status: DC | PRN

## 2022-07-10 NOTE — Telephone Encounter (Addendum)
Patient called requesting refill of Oxycodone be sent to [facility].  Last filled 06/02/22 #120

## 2022-07-10 NOTE — Telephone Encounter (Signed)
Sent  kc

## 2022-07-17 DIAGNOSIS — J9610 Chronic respiratory failure, unspecified whether with hypoxia or hypercapnia: Secondary | ICD-10-CM | POA: Diagnosis not present

## 2022-07-17 DIAGNOSIS — J449 Chronic obstructive pulmonary disease, unspecified: Secondary | ICD-10-CM | POA: Diagnosis not present

## 2022-07-18 ENCOUNTER — Encounter (HOSPITAL_BASED_OUTPATIENT_CLINIC_OR_DEPARTMENT_OTHER): Payer: Medicare Other | Attending: General Surgery | Admitting: General Surgery

## 2022-07-18 ENCOUNTER — Telehealth: Payer: Self-pay

## 2022-07-18 NOTE — Chronic Care Management (AMB) (Signed)
    Called Jerral R Guilliams, No answer, left message of appointment on 07-19-2022 at 1:00 via telephone visit with Arizona Constable Pharm D. Notified to have all medications, supplements, blood pressure and/or blood sugar logs available during appointment and to return call if need to reschedule.     [place] Pharmacist Assistant [phone]

## 2022-07-19 ENCOUNTER — Ambulatory Visit (INDEPENDENT_AMBULATORY_CARE_PROVIDER_SITE_OTHER): Payer: Medicare Other

## 2022-07-19 DIAGNOSIS — E782 Mixed hyperlipidemia: Secondary | ICD-10-CM

## 2022-07-19 DIAGNOSIS — E1142 Type 2 diabetes mellitus with diabetic polyneuropathy: Secondary | ICD-10-CM

## 2022-07-19 DIAGNOSIS — I10 Essential (primary) hypertension: Secondary | ICD-10-CM

## 2022-07-19 NOTE — Patient Instructions (Signed)
Visit Information   Goals Addressed   None    Patient Care Plan: CCM Pharmacy Care Plan     Problem Identified: Disease State Management   Priority: High  Onset Date: 05/17/2022     Long-Range Goal: Patient-Specific Goal   Start Date: 05/17/2022  Expected End Date: 05/17/2023  Recent Progress: On track  Priority: High  Note:   Current Barriers:  Does not contact provider office for questions/concerns  Pharmacist Clinical Goal(s):  Patient will contact provider office for questions/concerns as evidenced notation of same in electronic health record through collaboration with PharmD and provider.   Interventions: 1:1 collaboration with [name], MD regarding development and update of comprehensive plan of care as evidenced by provider attestation and co-signature Inter-disciplinary care team collaboration (see longitudinal plan of care) Comprehensive medication review performed; medication list updated in electronic medical record  Hypertension (BP goal <130/80) BP Readings from Last 3 Encounters:  06/22/22 118/62  06/02/22 122/72  05/19/22 126/84  -Not ideally controlled -Current treatment: Metoprolol Tart 4m BID Appropriate, Effective, Safe, Accessible -Medications previously tried: Amlodipine (Patient stated provider Dc'd)  -Current home readings:   July 2023: Normally around 106/60. Didn't have readings on them -Current dietary habits: "Tries to eat healthy" -Current exercise habits: None -Reports hypotensive/hypertensive symptoms -Educated on BP goals and benefits of medications for prevention of heart attack, stroke and kidney damage; -Counseled to monitor BP at home daily, document, and provide log at future appointments July 2023; Patient experiencing sweating and dizziness. His BP is typically around 1161systolic per patient. Has f/u with Cardio Friday. Counseled them to bring BP logs to appt Sept 2023: Spouse didn't wish to talk at this time. She was  busy  CAD: (LDL goal < 70) The ASCVD Risk score (Arnett DK, et al., 2019) failed to calculate for the following reasons:   The patient has a prior MI or stroke diagnosis Lab Results  Component Value Date   CHOL 117 05/19/2022   CHOL 129 12/16/2020   Lab Results  Component Value Date   HDL 23 (L) 05/19/2022   HDL 26 (L) 12/16/2020   Lab Results  Component Value Date   LDLCALC 27 05/19/2022   LDLCALC 55 12/16/2020   Lab Results  Component Value Date   TRIG 481 (H) 05/19/2022   TRIG 311 (H) 12/16/2020   Lab Results  Component Value Date   CHOLHDL 5.1 (H) 05/19/2022   CHOLHDL 5.0 12/16/2020  No results found for: "LDLDIRECT"  -Controlled -Current treatment: ASA 1629mAppropriate, Effective, Safe, Accessible Rosuvastatin 4076mppropriate, Effective, Safe, Accessible Clopidogrel 49m10mpropriate, Effective, Safe, Accessible -Medications previously tried: N/A  -Current dietary patterns: "Tries to eat healthy" -Current exercise habits: None -Educated on Cholesterol goals;  July 2023: Has f/u with Cardio Friday, will defer to specialist Sept 2023: Spouse didn't wish to talk at this time. She was busy  Diabetes (A1c goal <8%) Lab Results  Component Value Date   HGBA1C 7.9 (H) 05/19/2022   HGBA1C 6.8 (H) 12/16/2020   Lab Results  Component Value Date   MICROALBUR 30 05/23/2021   LDLCALC 27 05/19/2022   CREATININE 0.99 05/19/2022   Lab Results  Component Value Date   NA 142 05/19/2022   K 4.6 05/19/2022   CREATININE 0.99 05/19/2022   EGFR 81 05/19/2022   GFRNONAA 87 12/16/2020   GLUCOSE 210 (H) 05/19/2022   Lab Results  Component Value Date   WBC 9.8 05/19/2022   HGB 13.1 05/19/2022   HCT 39.3  05/19/2022   MCV 84 05/19/2022   PLT 212 05/19/2022  -Controlled -Current medications: Metformin 571m BID Appropriate, Effective, Safe, Accessible Toujeo Appropriate, Effective, Safe, Accessible -Medications previously tried: N/A  -Current home glucose  readings fasting glucose:  July 2023: 100-140 post prandial glucose: N/A -Denies hypoglycemic/hyperglycemic symptoms -Current exercise: N/A -Educated on A1c and blood sugar goals; -Counseled to check feet daily and get yearly eye exams July 2023: Patient states he sweats often. They do check his sugas and they are around 100-110, most likely not the source. Candidate for ACE/ARB Sept 2023: Spouse didn't wish to talk at this time. She was busy  Depression/Anxiety (Goal: Decrease symptoms) -Controlled -Current treatment: Duloxetine 656mBID Appropriate, Effective, Safe, Accessible Alprazolam 0.255mRN Query Appropriate,  -Medications previously tried/failed: N/A -PHQ9:     01/05/2022    3:45 PM 05/20/2021   10:08 AM 03/08/2020    3:08 PM  Depression screen PHQ 2/9  Decreased Interest 0 0 0  Down, Depressed, Hopeless 0 0 0  PHQ - 2 Score 0 0 0  -GAD7:      No data to display         -Educated on Benefits of medication for symptom control -Recommended to continue current medication. Counseled on risks with CS  BPH (Goal: improve urination) -IPSS Questionnaire (AUA-7): Over the past month.   1)  How often have you had a sensation of not emptying your bladder completely after you finish urinating?  Patient didn't understand the question, will write down peeing log and bring into f/u with PCP  2)  How often have you had to urinate again less than two hours after you finished urinating? Patient didn't understand the question, will write down peeing log and bring into f/u with PCP  3)  How often have you found you stopped and started again several times when you urinated?  Patient didn't understand the question, will write down peeing log and bring into f/u with PCP  4) How difficult have you found it to postpone urination?  Patient didn't understand the question, will write down peeing log and bring into f/u with PCP  5) How often have you had a weak urinary stream?  Patient didn't  understand the question, will write down peeing log and bring into f/u with PCP  6) How often have you had to push or strain to begin urination?  Patient didn't understand the question, will write down peeing log and bring into f/u with PCP  7) How many times did you most typically get up to urinate from the time you went to bed until the time you got up in the morning?  Patient didn't understand the question, will write down peeing log and bring into f/u with PCP  Total score:  0-7 mildly symptomatic   8-19 moderately symptomatic   20-35 severely symptomatic    -Not ideally controlled -Night time urination frequency: Patient didn't answer question -Current treatment: Tamsulosin 0.4mg72mD Appropriate, Effective, Safe, Accessible Finasteride 5mg 10mAppropriate, Effective, Safe, Accessible Mybetriq 25mg 35my Appropriate,  -Medications previously tried: N/A -Counseled on non-pharmacologic techniques such as Kegels July 2023: Mybetriq could interfere with Pulm meds. But patient states he also suffers from constipation and I would prefer not to use an anticholinergic Sept 2023: Spouse didn't wish to talk at this time. She was busy  Chronic Pain -Uncontrolled -Current treatment: Duloxetine 60mg B50muery Appropriate,  Hydrocodone/APAP Query Appropriate,  Cyclobenzaprine 10mg Qu45mAppropriate,  -Medications previously tried: N/A  -Pain  Scale There were no vitals filed for this visit.  Aggravating Factors: Chronic  Pain Type: Chronic  July 2023: Unable to do pain exam, spoke primarily with wife. Recommend Tizanidine due to being elderly. Both Duloxetine and Hydrocodone go through CYP2D6. Hydrocodone, a prodrug, is unable to metabolize to active metabolite. Recommend alternative treatment modality (Patient needs Duloxetine for mood and when he's tried to DC in past he "Hears trains") Sept 2023: Spouse didn't wish to talk at this time. She was busy  Insomnia (Goal: 7-8 hours of  sleep/night) -Uncontrolled -Patient currently getting 4 hours of sleep/night -Patient currently waking up Multiple times/night -Patient's concern is Both falling/staying alseep -Current treatment: Alprazolam 0.31m PRN Query Appropriate,  Trazodone 1593mQuery Appropriate,  -Medications previously tried: N/A -Counseled on sleep hygiene techniques (consistent sleep/wake up schedule, no electronic screens 1-2 hours before bed, etc) July 2023: Neither of these are preferred therapies. Patient most likely is unable to sleep due to pain. If we an get his pain addressed, will assess sleep patterns when under control Sept 2023: Spouse didn't wish to talk at this time. She was busy    Patient Goals/Self-Care Activities Patient will:  - take medications as prescribed as evidenced by patient report and record review  Follow Up Plan: The patient has been provided with contact information for the care management team and has been advised to call with any health related questions or concerns.   CPP F/U prn  [facility] - [phone]       Mr. StWoerneras given information about Chronic Care Management services today including:  CCM service includes personalized support from designated clinical staff supervised by his physician, including individualized plan of care and coordination with other care providers 24/7 contact phone numbers for assistance for urgent and routine care needs. Standard insurance, coinsurance, copays and deductibles apply for chronic care management only during months in which we provide at least 20 minutes of these services. Most insurances cover these services at 100%, however patients may be responsible for any copay, coinsurance and/or deductible if applicable. This service may help you avoid the need for more expensive face-to-face services. Only one practitioner may furnish and bill the service in a calendar month. The patient may stop CCM services at  any time (effective at the end of the month) by phone call to the office staff.  Patient agreed to services and verbal consent obtained.   The patient verbalized understanding of instructions, educational materials, and care plan provided today and DECLINED offer to receive copy of patient instructions, educational materials, and care plan.  The pharmacy team will reach out to the patient again over the next 60 days.   NaLane HackerRPWhat Cheer

## 2022-07-19 NOTE — Progress Notes (Signed)
Chronic Care Management Pharmacy Note  07/19/2022 Name:  Don [name] MRN:  [id number] DOB:  [birth year]  Summary: -Pleasant 71 year old presents for initial CCM visit. His wife, Anne Ng, primarily manages medications. They've been married 86 years with 4 kids and several grandchildren  Recommendations/Changes made from today's visit: -Wife, Anne Ng, was busy and couldn't speak for long. -Patient has no f/u scheduled with PCP. I am unsure how often the PCP would like a f/u so I am unable to send msg to nursing staff to schedule f/u with PCP  Subjective: Don [name] is an 71 y.o. year old male who is a primary patient of Cox, Kirsten, MD.  The CCM team was consulted for assistance with disease management and care coordination needs.    Engaged with patient by telephone for initial visit in response to provider referral for pharmacy case management and/or care coordination services.   Consent to Services:  The patient was given the following information about Chronic Care Management services today, agreed to services, and gave verbal consent: 1. CCM service includes personalized support from designated clinical staff supervised by the primary care provider, including individualized plan of care and coordination with other care providers 2. 24/7 contact phone numbers for assistance for urgent and routine care needs. 3. Service will only be billed when office clinical staff spend 20 minutes or more in a month to coordinate care. 4. Only one practitioner may furnish and bill the service in a calendar month. 5.The patient may stop CCM services at any time (effective at the end of the month) by phone call to the office staff. 6. The patient will be responsible for cost sharing (co-pay) of up to 20% of the service fee (after annual deductible is met). Patient agreed to services and consent obtained.  Patient Care Team: [name], MD as PCP - General (Family Medicine) Richardo Priest, MD  as Referring Physician (Cardiology) Melissa Montane, MD as Consulting Physician (Otolaryngology) Lane Hacker, [facility] as Pharmacist (Pharmacist)  Recent office visits:  05/12/22 [name] MD. Seen for COPD.Referral to Speech Therapy and [facility] Coordination. No med changes.   03/14/22 [name] MD. Seen for abdominal pain. Started on Ciprofloxacin HCI 530m and Metronidazole 5022m    02/17/22 PeReinaldo MeekerD. Seen for Covid. Started on Levofloxacin 50018mnd Paxlovid.   01/24/22 CoxRochel Brome. Orders Only. D/C Gemtesa 76m19md Ordered Myrbetriq 25mg76m 01/05/22 Cox, Rochel BromeSeen for back pain. Trial of gemtesa 75 mg daily.    12/30/21 PerryReinaldo MeekerSeen for COPD. Started on Levofloxacin 500mg 76mPrednisone 10mg. 35mecent consult visits:  12/15/21 (Oncology) McCartyHosie Poissonen for Squamous Cell Carcinoma. No med changes.    Hospital visits:  None in previous 6 months   Objective:  Lab Results  Component Value Date   CREATININE 0.99 05/19/2022   BUN 10 05/19/2022   EGFR 81 05/19/2022   GFRNONAA 87 12/16/2020   GFRAA 100 12/16/2020   NA 142 05/19/2022   K 4.6 05/19/2022   CALCIUM 9.1 05/19/2022   CO2 27 05/19/2022   GLUCOSE 210 (H) 05/19/2022    Lab Results  Component Value Date/Time   HGBA1C 7.9 (H) 05/19/2022 08:23 AM   HGBA1C 6.8 (H) 12/16/2020 09:13 AM   MICROALBUR 30 05/23/2021 09:39 AM   MICROALBUR 80 08/23/2020 02:39 PM    Last diabetic Eye exam: No results found for: "HMDIABEYEEXA"  Last diabetic Foot exam: No results  found for: "HMDIABFOOTEX"   Lab Results  Component Value Date   CHOL 117 05/19/2022   HDL 23 (L) 05/19/2022   LDLCALC 27 05/19/2022   TRIG 481 (H) 05/19/2022   CHOLHDL 5.1 (H) 05/19/2022       Latest Ref Rng & Units 05/19/2022    8:23 AM 03/14/2022    9:43 AM 12/15/2021   12:00 AM  Hepatic Function  Total Protein 6.0 - 8.5 g/dL 6.3  6.1    Albumin 3.7 - 4.7 g/dL 3.9  4.0  3.7      AST 0 - 40 IU/L _0 ALT 0 - 44 IU/L _1 Alk Phosphatase 44 - 121 IU/L 105  88  85      Total Bilirubin 0.0 - 1.2 mg/dL 0.2  0.3       This result is from an external source.    Lab Results  Component Value Date/Time   TSH 2.630 12/16/2020 09:13 AM   TSH 2.060 03/18/2020 10:27 AM       Latest Ref Rng & Units 05/19/2022    8:23 AM 03/14/2022    9:43 AM 12/15/2021   12:00 AM  CBC  WBC 3.4 - 10.8 x10E3/uL 9.8  12.0  7.4      Hemoglobin 13.0 - 17.7 g/dL 13.1  13.9  13.4      Hematocrit 37.5 - 51.0 % 39.3  41.4  40      Platelets 150 - 450 x10E3/uL 212  183  206         This result is from an external source.    No results found for: "VD25OH"  Clinical ASCVD: Yes  The ASCVD Risk score (Arnett DK, et al., 2019) failed to calculate for the following reasons:   The patient has a prior MI or stroke diagnosis       01/05/2022    3:45 PM 05/20/2021   10:08 AM 03/08/2020    3:08 PM  Depression screen PHQ 2/9  Decreased Interest 0 0 0  Down, Depressed, Hopeless 0 0 0  PHQ - 2 Score 0 0 0     Other: (CHADS2VASc if Afib, MMRC or CAT for COPD, ACT, DEXA)  Social History   Tobacco Use  Smoking Status Every Day   Packs/day: 0.50   Years: 50.00   Total pack years: 25.00   Types: Cigarettes  Smokeless Tobacco Never  Tobacco Comments   down to .5 ppd   BP Readings from Last 3 Encounters:  06/22/22 118/62  06/02/22 122/72  05/19/22 126/84   Pulse Readings from Last 3 Encounters:  06/22/22 84  06/02/22 67  05/19/22 83   Wt Readings from Last 3 Encounters:  06/22/22 194 lb (88 kg)  06/02/22 198 lb (89.8 kg)  05/24/22 198 lb (89.8 kg)   BMI Readings from Last 3 Encounters:  06/22/22 27.84 kg/m  06/02/22 28.41 kg/m  05/24/22 28.41 kg/m    Assessment/Interventions: Review of patient past medical history, allergies, medications, health status, including review of consultants reports, laboratory and other test data, was performed as part of comprehensive evaluation and provision  of chronic care management services.   SDOH:  (Social Determinants of Health) assessments and interventions performed: No SDOH Interventions    Flowsheet Row Chronic Care Management from 07/19/2022 in [place] [facility] from 05/17/2022 in [place]  SDOH Interventions  Transportation Interventions Intervention Not Indicated Intervention Not Indicated  Financial Strain Interventions Intervention Not Indicated Intervention Not Indicated      SDOH Screenings   Transportation Needs: No Transportation Needs (07/19/2022)  Depression (PHQ2-9): Low Risk  (01/05/2022)  Financial Resource Strain: Low Risk  (07/19/2022)  Tobacco Use: High Risk (06/22/2022)    CCM Care Plan  Allergies  Allergen Reactions   Penicillins Rash and Hives    Has patient had a PCN reaction causing immediate rash, facial/tongue/throat swelling, SOB or lightheadedness with hypotension:unsure Has patient had a PCN reaction causing severe rash involving mucus membranes or skin necrosis:unsure Has patient had a PCN reaction that required hospitalization:No Has patient had a PCN reaction occurring within the last 10 years:No If all of the above answers are "NO", then may proceed with Cephalosporin use.  rash   Prednisone     Depressed mood - can tolerate it, but it makes him very irritable   Doxycycline Rash   Iodinated Contrast Media Rash    Also developed blisters Also developed blisters   Penicillin G Rash   Tramadol Rash    Medications Reviewed Today     Reviewed by Lane Hacker, [facility] (Pharmacist) on 07/19/22 at 1320  Med List Status: <None>   Medication Order Taking? Sig Documenting Provider Last Dose Status Informant  ALPRAZolam (XANAX) 0.25 MG tablet [id number]  TAKE 1 TABLET BY MOUTH ONCE DAILY AS NEEDED FOR ANXIETY Cox, Kirsten, MD  Active   cephALEXin (KEFLEX) 500 MG capsule [id number]  Take 1 capsule (500 mg total) by mouth 3 (three) times daily. Cox, Kirsten, MD  Active    clopidogrel (PLAVIX) 75 MG tablet [id number]  Take 1 tablet by mouth once daily Cox, Kirsten, MD  Active   cyclobenzaprine (FLEXERIL) 10 MG tablet [id number] No One three times a day as needed for muscle spasm. Cox, Kirsten, MD Taking Active   DULoxetine (CYMBALTA) 60 MG capsule [id number] No Take 1 capsule by mouth twice daily Cox, Kirsten, MD Taking Active   finasteride (PROSCAR) 5 MG tablet [id number] No Take 1 tablet by mouth once daily Cox, Kirsten, MD Taking Active   fluticasone furoate-vilanterol (BREO ELLIPTA) 100-25 MCG/ACT AEPB [id number] No INHALE 1 PUFF BY MOUTH ONCE DAILY Cox, Kirsten, MD Taking Active   gabapentin (NEURONTIN) 400 MG capsule [id number] No TAKE 1 CAPSULE BY MOUTH THREE TIMES DAILY Cox, Kirsten, MD Taking Active   ipratropium-albuterol (DUONEB) 0.5-2.5 (3) MG/3ML Bailey Mech [id number] No USE 1 AMPULE IN NEBULIZER 4 TIMES DAILY Cox, Kirsten, MD Taking Active   Lancets [facility] DELICA PLUS [id number]) [place] [id number] No USE 1  TO CHECK GLUCOSE THREE TIMES DAILY Cox, Kirsten, MD Taking Active   metFORMIN (GLUCOPHAGE) 1000 MG tablet [id number]  Take 1 tablet (1,000 mg total) by mouth 2 (two) times daily with a meal. Cox, Kirsten, MD  Active   metoprolol tartrate (LOPRESSOR) 50 MG tablet [id number] No Take 1 tablet by mouth twice daily Cox, Kirsten, MD Taking Active   MYRBETRIQ 25 MG TB24 tablet [id number] No Take 1 tablet by mouth once daily Cox, Kirsten, MD Taking Active   nitroGLYCERIN (NITROSTAT) 0.4 MG SL tablet [id number] No DISSOLVE ONE TABLET UNDER THE TONGUE EVERY 5 TO 10 MINUTES PRIOR TO ACTIVITIES WHICH MIGHT PRECIPITATE AN ATTACK Marge Duncans, PA-C Taking Active   nystatin cream (MYCOSTATIN) [id number] No Apply 1 application topically 2 (two) times daily as needed for dry skin. [provider] Taking Active   Omega-3 Fatty Acids (FISH OIL) 1000 MG CAPS [id number] No  Take 2 capsules (2,000 mg total) by mouth in the morning and at bedtime. Richardo Priest, MD Taking Active    omeprazole (PRILOSEC) 20 MG capsule [id number] No Take 1 capsule by mouth once daily Cox, Elnita Maxwell, MD Taking Active   [facility] ULTRA test strip [id number] No USE 1 STRIP TO CHECK GLUCOSE TWICE DAILY Cox, Kirsten, MD Taking Active   oxyCODONE-acetaminophen (PERCOCET/ROXICET) 5-325 MG tablet [id number]  Take 1 tablet by mouth every 6 (six) hours as needed for severe pain. Cox, Kirsten, MD  Active   rosuvastatin (CRESTOR) 40 MG tablet [id number] No Take 1 tablet by mouth once daily Cox, Kirsten, MD Taking Active   SPIRIVA RESPIMAT 2.5 MCG/ACT AERS [id number] No Inhale 2 puffs into the lungs daily. Cox, Kirsten, MD Taking Active   tamsulosin (FLOMAX) 0.4 MG CAPS capsule [id number] No TAKE 2 CAPSULES BY MOUTH ONCE DAILY AFTER SUPPER Cox, Kirsten, MD Taking Active   TOUJEO SOLOSTAR 300 UNIT/ML Solostar Pen [id number] No INJECT 30 UNITS SUBCUTANEOUSLY ONCE DAILY Cox, Kirsten, MD Taking Active   traZODone (DESYREL) 150 MG tablet [id number] No Take 1 tablet by mouth once daily Cox, Kirsten, MD Taking Active   VENTOLIN HFA 108 (90 Base) MCG/ACT inhaler [id number] No Inhale 2 puffs into the lungs every 6 (six) hours as needed for shortness of breath or wheezing. [name], MD Taking Active             Patient Active Problem List   Diagnosis Date Noted   Stage II pressure ulcer of right hip ([place]) 06/22/2022   Impacted cerumen of left ear 06/22/2022   Chronic pain syndrome 06/04/2022   Lumbar back pain 06/04/2022   Acute bacterial otitis media, left 06/04/2022   Obstructive chronic bronchitis with exacerbation ([place]) 05/16/2022   Chronic respiratory failure with hypoxia ([place]) 05/16/2022   Diaphoresis 05/16/2022   Chronic cough 05/16/2022   Diabetic polyneuropathy associated with type 2 diabetes mellitus ([place]) 05/16/2022   Chronic neck pain 05/16/2022   Left flank pain 03/14/2022   Left lower quadrant abdominal tenderness without rebound tenderness 03/14/2022   Cancer ([place]) 02/02/2022   Throat cancer  ([place]) 02/02/2022   Anxiety state 02/02/2022   Mood disorder in conditions classified elsewhere 02/02/2022   Acute bilateral low back pain without sciatica 01/08/2022   Absence of bladder continence 01/08/2022   Deficiency anemia 08/25/2021    Class: Acute   B12 deficiency anemia 08/25/2021    Class: Diagnosis of   Iron deficiency anemia due to chronic blood loss 08/25/2021    Class: Chronic   Blood in ear canal, left 05/13/2021   Insomnia 01/28/2021   Malignant neoplasm of skin 01/28/2021   Arthritis    Basal cell carcinoma    Coronary artery disease    Depression    Emphysema lung (HCC)    GERD (gastroesophageal reflux disease)    Hypertension    Lesion of vocal cord    Melanoma of back ([place])    Peripheral vascular disease ([place])    Pneumothorax 06/07/2020   Diverticulitis 05/02/2020   Diabetes mellitus ([place]) 03/08/2020   Myalgia 03/08/2020   Bone pain 03/08/2020   Memory loss 03/08/2020   CKD (chronic kidney disease) stage 3, GFR 30-59 ml/min ([place]) 07/25/2019   Erectile dysfunction due to diseases classified elsewhere 06/12/2019   Peyronie's disease 06/12/2019   Tobacco use disorder 06/12/2019   Benign prostatic hyperplasia with incomplete bladder emptying 01/14/2019   Malfunction of penile prosthesis ([place]) 01/14/2019   Testicular pain, left 01/14/2019  Long-term use of aspirin therapy 10/02/2018   Oropharyngeal dysphagia 08/12/2018   Pharyngoesophageal dysphagia 08/12/2018   COPD (chronic obstructive pulmonary disease) (HCC) 04/03/2018   Hyperlipidemia 04/03/2018   Preoperative clearance 04/03/2018   Chest pain syndrome 11/02/2017   Asymptomatic LV dysfunction 10/18/2017   Essential hypertension 10/18/2017   PAD (peripheral artery disease) (HCC) 10/18/2017   Chronic coronary artery disease 10/18/2017   Squamous cell carcinoma of larynx (HCC) 12/13/2016   Cardiomyopathy, secondary (HCC) 12/01/2016   Coronary artery disease involving native coronary artery of native  heart with angina pectoris (HCC) 12/01/2016   Dyslipidemia 12/01/2016   Hoarseness 11/15/2016   Leukoplakia of larynx 11/15/2016   Laceration of thumb, left 12/17/2015   Pneumonia 02/2015   Myocardial infarction (HCC) 2000   Chronic ischemic heart disease 11/13/1998   Other ill-defined and unknown causes of morbidity and mortality 11/13/1993    Immunization History  Administered Date(s) Administered   Fluad Quad(high Dose 65+) 08/16/2020, 09/01/2021   H1N1 09/13/2008   Influenza Split 09/01/2021   Influenza, High Dose Seasonal PF 10/13/2017, 08/26/2019, 08/16/2020   Influenza-Unspecified 11/13/2002, 08/19/2004, 09/13/2004, 09/13/2006, 10/17/2007, 08/12/2008, 10/13/2017, 08/26/2019   Moderna SARS-COV2 Booster Vaccination 10/12/2020   Moderna Sars-Covid-2 Vaccination 10/12/2020   Pneumococcal Conjugate-13 03/13/2000, 10/01/2015   Pneumococcal Polysaccharide-23 11/13/2002, 08/13/2009   Pneumococcal-Unspecified 03/13/2000   Td 11/13/1996    Conditions to be addressed/monitored:  Hypertension, Hyperlipidemia, and Diabetes  Care Plan : CCM Pharmacy Care Plan  Updates made by Zettie Pho, RPH since 07/19/2022 12:00 AM     Problem: Disease State Management   Priority: High  Onset Date: 05/17/2022     Long-Range Goal: Patient-Specific Goal   Start Date: 05/17/2022  Expected End Date: 05/17/2023  Recent Progress: On track  Priority: High  Note:   Current Barriers:  Does not contact provider office for questions/concerns  Pharmacist Clinical Goal(s):  Patient will contact provider office for questions/concerns as evidenced notation of same in electronic health record through collaboration with PharmD and provider.   Interventions: 1:1 collaboration with Blane Ohara, MD regarding development and update of comprehensive plan of care as evidenced by provider attestation and co-signature Inter-disciplinary care team collaboration (see longitudinal plan of care) Comprehensive  medication review performed; medication list updated in electronic medical record  Hypertension (BP goal <130/80) BP Readings from Last 3 Encounters:  06/22/22 118/62  06/02/22 122/72  05/19/22 126/84  -Not ideally controlled -Current treatment: Metoprolol Tart 50mg  BID Appropriate, Effective, Safe, Accessible -Medications previously tried: Amlodipine (Patient stated provider Dc'd)  -Current home readings:   July 2023: Normally around 106/60. Didn't have readings on them -Current dietary habits: "Tries to eat healthy" -Current exercise habits: None -Reports hypotensive/hypertensive symptoms -Educated on BP goals and benefits of medications for prevention of heart attack, stroke and kidney damage; -Counseled to monitor BP at home daily, document, and provide log at future appointments July 2023; Patient experiencing sweating and dizziness. His BP is typically around 100 systolic per patient. Has f/u with Cardio Friday. Counseled them to bring BP logs to appt Sept 2023: Spouse didn't wish to talk at this time. She was busy  CAD: (LDL goal < 70) The ASCVD Risk score (Arnett DK, et al., 2019) failed to calculate for the following reasons:   The patient has a prior MI or stroke diagnosis Lab Results  Component Value Date   CHOL 117 05/19/2022   CHOL 129 12/16/2020   Lab Results  Component Value Date   HDL 23 (L) 05/19/2022  HDL 26 (L) 12/16/2020   Lab Results  Component Value Date   LDLCALC 27 05/19/2022   LDLCALC 55 12/16/2020   Lab Results  Component Value Date   TRIG 481 (H) 05/19/2022   TRIG 311 (H) 12/16/2020   Lab Results  Component Value Date   CHOLHDL 5.1 (H) 05/19/2022   CHOLHDL 5.0 12/16/2020  No results found for: "LDLDIRECT"  -Controlled -Current treatment: ASA 166m Appropriate, Effective, Safe, Accessible Rosuvastatin 4105mAppropriate, Effective, Safe, Accessible Clopidogrel 7523mppropriate, Effective, Safe, Accessible -Medications previously tried:  N/A  -Current dietary patterns: "Tries to eat healthy" -Current exercise habits: None -Educated on Cholesterol goals;  July 2023: Has f/u with Cardio Friday, will defer to specialist Sept 2023: Spouse didn't wish to talk at this time. She was busy  Diabetes (A1c goal <8%) Lab Results  Component Value Date   HGBA1C 7.9 (H) 05/19/2022   HGBA1C 6.8 (H) 12/16/2020   Lab Results  Component Value Date   MICROALBUR 30 05/23/2021   LDLCALC 27 05/19/2022   CREATININE 0.99 05/19/2022   Lab Results  Component Value Date   NA 142 05/19/2022   K 4.6 05/19/2022   CREATININE 0.99 05/19/2022   EGFR 81 05/19/2022   GFRNONAA 87 12/16/2020   GLUCOSE 210 (H) 05/19/2022   Lab Results  Component Value Date   WBC 9.8 05/19/2022   HGB 13.1 05/19/2022   HCT 39.3 05/19/2022   MCV 84 05/19/2022   PLT 212 05/19/2022  -Controlled -Current medications: Metformin 500m99mD Appropriate, Effective, Safe, Accessible [place] Appropriate, Effective, Safe, Accessible -Medications previously tried: N/A  -Current home glucose readings fasting glucose:  July 2023: 100-140 post prandial glucose: N/A -Denies hypoglycemic/hyperglycemic symptoms -Current exercise: N/A -Educated on A1c and blood sugar goals; -Counseled to check feet daily and get yearly eye exams July 2023: Patient states he sweats often. They do check his sugas and they are around 100-110, most likely not the source. Candidate for ACE/ARB Sept 2023: Spouse didn't wish to talk at this time. She was busy  Depression/Anxiety (Goal: Decrease symptoms) -Controlled -Current treatment: Duloxetine 60mg45m Appropriate, Effective, Safe, Accessible Alprazolam 0.25mg 49mQuery Appropriate,  -Medications previously tried/failed: N/A -PHQ9:     01/05/2022    3:45 PM 05/20/2021   10:08 AM 03/08/2020    3:08 PM  Depression screen PHQ 2/9  Decreased Interest 0 0 0  Down, Depressed, Hopeless 0 0 0  PHQ - 2 Score 0 0 0  -GAD7:      No data to  display         -Educated on Benefits of medication for symptom control -Recommended to continue current medication. Counseled on risks with CS  BPH (Goal: improve urination) -IPSS Questionnaire (AUA-7): Over the past month.   1)  How often have you had a sensation of not emptying your bladder completely after you finish urinating?  Patient didn't understand the question, will write down peeing log and bring into f/u with PCP  2)  How often have you had to urinate again less than two hours after you finished urinating? Patient didn't understand the question, will write down peeing log and bring into f/u with PCP  3)  How often have you found you stopped and started again several times when you urinated?  Patient didn't understand the question, will write down peeing log and bring into f/u with PCP  4) How difficult have you found it to postpone urination?  Patient didn't understand the question, will write down  peeing log and bring into f/u with PCP  5) How often have you had a weak urinary stream?  Patient didn't understand the question, will write down peeing log and bring into f/u with PCP  6) How often have you had to push or strain to begin urination?  Patient didn't understand the question, will write down peeing log and bring into f/u with PCP  7) How many times did you most typically get up to urinate from the time you went to bed until the time you got up in the morning?  Patient didn't understand the question, will write down peeing log and bring into f/u with PCP  Total score:  0-7 mildly symptomatic   8-19 moderately symptomatic   20-35 severely symptomatic    -Not ideally controlled -Night time urination frequency: Patient didn't answer question -Current treatment: Tamsulosin 0.71m 2QD Appropriate, Effective, Safe, Accessible Finasteride 536mQD Appropriate, Effective, Safe, Accessible Mybetriq 2568muery Appropriate,  -Medications previously tried: N/A -Counseled on  non-pharmacologic techniques such as Kegels July 2023: Mybetriq could interfere with Pulm meds. But patient states he also suffers from constipation and I would prefer not to use an anticholinergic Sept 2023: Spouse didn't wish to talk at this time. She was busy  Chronic Pain -Uncontrolled -Current treatment: Duloxetine 55m38mD Query Appropriate,  Hydrocodone/APAP Query Appropriate,  Cyclobenzaprine 10mg20mry Appropriate,  -Medications previously tried: N/A  -Pain Scale There were no vitals filed for this visit.  Aggravating Factors: Chronic  Pain Type: Chronic  July 2023: Unable to do pain exam, spoke primarily with wife. Recommend Tizanidine due to being elderly. Both Duloxetine and Hydrocodone go through CYP2D6. Hydrocodone, a prodrug, is unable to metabolize to active metabolite. Recommend alternative treatment modality (Patient needs Duloxetine for mood and when he's tried to DC in past he "Hears trains") Sept 2023: Spouse didn't wish to talk at this time. She was busy  Insomnia (Goal: 7-8 hours of sleep/night) -Uncontrolled -Patient currently getting 4 hours of sleep/night -Patient currently waking up Multiple times/night -Patient's concern is Both falling/staying alseep -Current treatment: Alprazolam 0.25mg 57mQuery Appropriate,  Trazodone 150mg Q37m Appropriate,  -Medications previously tried: N/A -Counseled on sleep hygiene techniques (consistent sleep/wake up schedule, no electronic screens 1-2 hours before bed, etc) July 2023: Neither of these are preferred therapies. Patient most likely is unable to sleep due to pain. If we an get his pain addressed, will assess sleep patterns when under control Sept 2023: Spouse didn't wish to talk at this time. She was busy    Patient Goals/Self-Care Activities Patient will:  - take medications as prescribed as evidenced by patient report and record review  Follow Up Plan: The patient has been provided with contact information  for the care management team and has been advised to call with any health related questions or concerns.   CPP F/U prn  Margarete Horace [place] Constable.D. - [phone]        Medication Assistance: None required.  Patient affirms current coverage meets needs.  Compliance/Adherence/Medication fill history: [facility] Drugs:  Medication:                Last Fill:         Day Supply Metformin                    05/07/22-02/07/22 90ds Rosuvastatin               04/27/22-02/06/22 90ds     Care Gaps: Last Annual Wellness visit?None  noted  Last Colonoscopy?10/19/21 Last Bone Denisty? Never done Last eye exam / retinopathy screening?Never done Last diabetic foot exam?Never done   Immunizations:  Pneumonia 10/01/15 Covid 10/12/20 Influenza 09/01/21  Patient's preferred pharmacy is:  [facility] [address] [place] [address] [place] [address] Phone: [phone] Fax: [phone]  Uses pill box? Yes Pt endorses 100% compliance  We discussed: Current pharmacy is preferred with insurance plan and patient is satisfied with pharmacy services Patient decided to: Continue current medication management strategy  Care Plan and Follow Up Patient Decision:  Patient agrees to Care Plan and Follow-up.  Plan: The patient has been provided with contact information for the care management team and has been advised to call with any health related questions or concerns.   CPP F/U prn  [place] Constable, Pharm.D. - [phone]

## 2022-07-20 ENCOUNTER — Other Ambulatory Visit: Payer: Self-pay | Admitting: Family Medicine

## 2022-07-25 ENCOUNTER — Other Ambulatory Visit: Payer: Self-pay | Admitting: Family Medicine

## 2022-07-31 ENCOUNTER — Other Ambulatory Visit: Payer: Self-pay | Admitting: Family Medicine

## 2022-07-31 DIAGNOSIS — E782 Mixed hyperlipidemia: Secondary | ICD-10-CM

## 2022-08-02 ENCOUNTER — Telehealth: Payer: Self-pay

## 2022-08-02 NOTE — Patient Outreach (Signed)
  Care Coordination   Initial Visit Note   08/02/2022 Name: DUTCH ING MRN: [id number] DOB: March 02, 1951  Hortense Ramal Rands is a 71 y.o. year old male who sees Cox, Kirsten, MD for primary care. I  placed call to patient and patient not available  to give me permission to speak with other family  What matters to the patients health and wellness today?  Patient not available    Goals Addressed   None     SDOH assessments and interventions completed:  No     Care Coordination Interventions Activated:  No  Care Coordination Interventions:  No, not indicated   Follow up plan:  will outreach again    Encounter Outcome:  No Answer   Tomasa Rand, RN, BSN, CEN [place] Coordinator [phone]

## 2022-08-03 DIAGNOSIS — M47816 Spondylosis without myelopathy or radiculopathy, lumbar region: Secondary | ICD-10-CM | POA: Diagnosis not present

## 2022-08-08 ENCOUNTER — Other Ambulatory Visit: Payer: Self-pay | Admitting: Family Medicine

## 2022-08-10 ENCOUNTER — Other Ambulatory Visit: Payer: Self-pay

## 2022-08-10 MED ORDER — OXYCODONE-ACETAMINOPHEN 5-325 MG PO TABS: 1.0000 | ORAL_TABLET | Freq: Four times a day (QID) | ORAL | 0 refills | Status: DC | PRN

## 2022-08-11 ENCOUNTER — Ambulatory Visit: Payer: Medicare Other | Admitting: Adult Health

## 2022-08-12 DIAGNOSIS — I2510 Atherosclerotic heart disease of native coronary artery without angina pectoris: Secondary | ICD-10-CM | POA: Diagnosis not present

## 2022-08-12 DIAGNOSIS — I119 Hypertensive heart disease without heart failure: Secondary | ICD-10-CM | POA: Diagnosis not present

## 2022-08-12 DIAGNOSIS — E785 Hyperlipidemia, unspecified: Secondary | ICD-10-CM

## 2022-08-12 DIAGNOSIS — F32A Depression, unspecified: Secondary | ICD-10-CM

## 2022-08-12 DIAGNOSIS — Z794 Long term (current) use of insulin: Secondary | ICD-10-CM

## 2022-08-12 DIAGNOSIS — N400 Enlarged prostate without lower urinary tract symptoms: Secondary | ICD-10-CM

## 2022-08-12 DIAGNOSIS — E1159 Type 2 diabetes mellitus with other circulatory complications: Secondary | ICD-10-CM

## 2022-08-15 ENCOUNTER — Other Ambulatory Visit: Payer: Self-pay | Admitting: Family Medicine

## 2022-08-16 ENCOUNTER — Telehealth: Payer: Self-pay

## 2022-08-16 DIAGNOSIS — J9610 Chronic respiratory failure, unspecified whether with hypoxia or hypercapnia: Secondary | ICD-10-CM | POA: Diagnosis not present

## 2022-08-16 DIAGNOSIS — J449 Chronic obstructive pulmonary disease, unspecified: Secondary | ICD-10-CM | POA: Diagnosis not present

## 2022-08-16 NOTE — Patient Outreach (Signed)
  Care Coordination   08/16/2022 Name: Don [name] MRN: [id number] DOB: 12/10/1950   Care Coordination Outreach Attempts:  A second unsuccessful outreach was attempted today to offer the patient with information about available care coordination services as a benefit of their health plan.     Follow Up Plan:  Additional outreach attempts will be made to offer the patient care coordination information and services.   Encounter Outcome:  No Answer  Care Coordination Interventions Activated:  No   Care Coordination Interventions:  No, not indicated   Tomasa Rand, RN, BSN, CEN [facility] [employer] [phone]

## 2022-08-17 ENCOUNTER — Encounter: Payer: Self-pay | Admitting: Adult Health

## 2022-08-17 ENCOUNTER — Ambulatory Visit (INDEPENDENT_AMBULATORY_CARE_PROVIDER_SITE_OTHER): Payer: Medicare Other

## 2022-08-17 ENCOUNTER — Ambulatory Visit: Payer: Medicare Other | Admitting: Adult Health

## 2022-08-17 VITALS — BP 120/70 | HR 60 | Temp 97.4°F | Ht 70.0 in | Wt 193.4 lb

## 2022-08-17 DIAGNOSIS — F17200 Nicotine dependence, unspecified, uncomplicated: Secondary | ICD-10-CM

## 2022-08-17 DIAGNOSIS — R0609 Other forms of dyspnea: Secondary | ICD-10-CM

## 2022-08-17 DIAGNOSIS — J9611 Chronic respiratory failure with hypoxia: Secondary | ICD-10-CM | POA: Diagnosis not present

## 2022-08-17 DIAGNOSIS — J449 Chronic obstructive pulmonary disease, unspecified: Secondary | ICD-10-CM | POA: Diagnosis not present

## 2022-08-17 DIAGNOSIS — R918 Other nonspecific abnormal finding of lung field: Secondary | ICD-10-CM

## 2022-08-17 DIAGNOSIS — Z23 Encounter for immunization: Secondary | ICD-10-CM

## 2022-08-17 HISTORY — DX: Other nonspecific abnormal finding of lung field: R91.8

## 2022-08-17 LAB — CBC WITH DIFFERENTIAL/PLATELET
Basophils Absolute: 0.1 10*3/uL (ref 0.0–0.1)
Basophils Relative: 0.8 % (ref 0.0–3.0)
Eosinophils Absolute: 0.3 10*3/uL (ref 0.0–0.7)
Eosinophils Relative: 2.7 % (ref 0.0–5.0)
HCT: 38.8 % — ABNORMAL LOW (ref 39.0–52.0)
Hemoglobin: 12.9 g/dL — ABNORMAL LOW (ref 13.0–17.0)
Lymphocytes Relative: 31.5 % (ref 12.0–46.0)
Lymphs Abs: 3.6 10*3/uL (ref 0.7–4.0)
MCHC: 33.2 g/dL (ref 30.0–36.0)
MCV: 81 fl (ref 78.0–100.0)
Monocytes Absolute: 0.8 10*3/uL (ref 0.1–1.0)
Monocytes Relative: 7.4 % (ref 3.0–12.0)
Neutro Abs: 6.6 10*3/uL (ref 1.4–7.7)
Neutrophils Relative %: 57.6 % (ref 43.0–77.0)
Platelets: 228 10*3/uL (ref 150.0–400.0)
RBC: 4.78 Mil/uL (ref 4.22–5.81)
RDW: 16.1 % — ABNORMAL HIGH (ref 11.5–15.5)
WBC: 11.4 10*3/uL — ABNORMAL HIGH (ref 4.0–10.5)

## 2022-08-17 LAB — BASIC METABOLIC PANEL
BUN: 12 mg/dL (ref 6–23)
CO2: 31 mEq/L (ref 19–32)
Calcium: 9.4 mg/dL (ref 8.4–10.5)
Chloride: 99 mEq/L (ref 96–112)
Creatinine, Ser: 1.01 mg/dL (ref 0.40–1.50)
GFR: 74.13 mL/min (ref >60.00)
Glucose, Bld: 239 mg/dL — ABNORMAL HIGH (ref 70–99)
Potassium: 3.9 mEq/L (ref 3.5–5.1)
Sodium: 137 mEq/L (ref 135–145)

## 2022-08-17 LAB — BRAIN NATRIURETIC PEPTIDE: Pro B Natriuretic peptide (BNP): 34 pg/mL (ref 0.0–100.0)

## 2022-08-17 MED ORDER — REVEFENACIN 175 MCG/3ML IN SOLN: 175.0000 ug | Freq: Every day | RESPIRATORY_TRACT | 11 refills | Status: DC

## 2022-08-17 MED ORDER — ARFORMOTEROL TARTRATE 15 MCG/2ML IN NEBU: 15.0000 ug | INHALATION_SOLUTION | Freq: Two times a day (BID) | RESPIRATORY_TRACT | 6 refills | Status: DC

## 2022-08-17 MED ORDER — BUDESONIDE 0.5 MG/2ML IN SUSP: 0.5000 mg | Freq: Two times a day (BID) | RESPIRATORY_TRACT | 12 refills | Status: DC

## 2022-08-17 NOTE — Assessment & Plan Note (Signed)
Severe COPD patient is encouraged on smoking cessation.  We will check chest x-ray today.  We will change inhalers over to neb medicines to see if he can have better symptom control.  Plan  Patient Instructions  Stop BREO and Spiriva  Begin Budesonide and Brovana Neb Twice daily  Albuterol inhaler or neb As needed   Begin Yupelri Neb daily  Chest xray today  CT chest to follow lung nodules  Labs today .  Flu shot today  Work on not smoking  Continue on Oxygen 2l/m At bedtime   Follow up with Dr. Chase Caller in 4 weeks with PFT and As needed   Please contact office for sooner follow up if symptoms do not improve or worsen or seek emergency care

## 2022-08-17 NOTE — Progress Notes (Signed)
$'@Patient'a$  ID: Don [name], male    DOB: 10-18-1951, 71 y.o.   MRN: [id number]  Chief Complaint  Patient presents with   Follow-up    Referring provider: [name], MD  HPI: 71 year old male active smoker followed for COPD and lung nodules Medical history significant for squamous cell carcinoma of the Larynx.  Memory impairment  TEST/EVENTS :   08/17/2022 Follow up : COPD and Lung nodules  Patient presents for a follow-up visit.  Last seen November 2020.  Patient is accompanied by his wife. Patient has underlying COPD.  Is currently on Breo and Spiriva.  Does have difficulty using his inhalers.  Patient does have some memory impairment.  Also feels that he cannot get all the medicine out of the inhalers.  Just feels that his breathing is slowly getting worse over the last couple years.  Gets short of breath with minimum activities.  Patient is sedentary.  Patient wants to know if he can switch his inhalers over to nebulizer medicines.  Patient does continue to smoke.  We discussed smoking cessation. He is on oxygen 2 L at bedtime. Spirometry today in the office shows FEV1 at 31%, ratio 42.   Allergies  Allergen Reactions   Penicillins Rash and Hives    Has patient had a PCN reaction causing immediate rash, facial/tongue/throat swelling, SOB or lightheadedness with hypotension:unsure Has patient had a PCN reaction causing severe rash involving mucus membranes or skin necrosis:unsure Has patient had a PCN reaction that required hospitalization:No Has patient had a PCN reaction occurring within the last 10 years:No If all of the above answers are "NO", then may proceed with Cephalosporin use.  rash   Prednisone     Depressed mood - can tolerate it, but it makes him very irritable   Doxycycline Rash   Iodinated Contrast Media Rash    Also developed blisters Also developed blisters   Penicillin G Rash   Tramadol Rash    Immunization History  Administered Date(s)  Administered   Fluad Quad(high Dose 65+) 08/16/2020, 09/01/2021, 08/17/2022   H1N1 09/13/2008   Influenza Split 09/01/2021   Influenza, High Dose Seasonal PF 10/13/2017, 08/26/2019, 08/16/2020   Influenza-Unspecified 11/13/2002, 08/19/2004, 09/13/2004, 09/13/2006, 10/17/2007, 08/12/2008, 10/13/2017, 08/26/2019   Moderna SARS-COV2 Booster Vaccination 10/12/2020   Moderna Sars-Covid-2 Vaccination 10/12/2020   Pneumococcal Conjugate-13 03/13/2000, 10/01/2015   Pneumococcal Polysaccharide-23 11/13/2002, 08/13/2009   Pneumococcal-Unspecified 03/13/2000   Td 11/13/1996    Past Medical History:  Diagnosis Date   Absence of bladder continence 01/08/2022   Acute bilateral low back pain without sciatica 01/08/2022   Anxiety state 02/02/2022   Arthritis    Asymptomatic LV dysfunction 10/18/2017   B12 deficiency anemia 08/25/2021   Basal cell carcinoma    Benign prostatic hyperplasia with incomplete bladder emptying 01/14/2019   Blood in ear canal, left 05/13/2021   Bone pain 03/08/2020   Cancer ([place])    throat - 1997, throat - 2018   Cardiomyopathy, secondary ([place]) 12/01/2016   Overview:  EF 47% 12/26/16   Chest pain syndrome 11/02/2017   Chronic coronary artery disease 10/18/2017   Chronic cough 05/16/2022   Chronic ischemic heart disease 11/13/1998   Jan 22, 2003 Entered By: Marland Kitchen J Comment:  massive Mi in 2000 per patient hsitory, 2nd Mi in 2001Mar 11, 2004 Entered By: Marland Kitchen J Comment: Had stent placedin 2000,cath/angio in 2001   Chronic neck pain 05/16/2022   Chronic respiratory failure with hypoxia ([place]) 05/16/2022   CKD (chronic kidney disease) stage  3, GFR 30-59 ml/min (HCC) 07/25/2019   COPD (chronic obstructive pulmonary disease) (HCC)    Coronary artery disease    Coronary artery disease involving native coronary artery of native heart with angina pectoris ([place]) 12/01/2016   Overview:  He has hx of IWMI in remote past, last cath in 2012 at [facility] showed chronic total occlusion  of previously stented RCA, with good collaterals, a 40% LAD stenosis, inferior hypokinesis and EF 45%.    He's been lost to Cardiology f/u since 2015, but has not had any recurrent events   Deficiency anemia 08/25/2021   Depression    Diabetes mellitus ([place]) 03/08/2020   Diabetic polyneuropathy associated with type 2 diabetes mellitus ([place]) 05/16/2022   Diaphoresis 05/16/2022   Diverticulitis 05/02/2020   Dyslipidemia 12/01/2016   Emphysema lung ([place])    Erectile dysfunction due to diseases classified elsewhere 06/12/2019   Essential hypertension 10/18/2017   GERD (gastroesophageal reflux disease)    Hoarseness 11/15/2016   Hyperlipidemia    Hypertension    Insomnia 01/28/2021   Iron deficiency anemia due to chronic blood loss 08/25/2021   Laceration of thumb, left 12/17/2015   Left flank pain 03/14/2022   Left lower quadrant abdominal tenderness without rebound tenderness 03/14/2022   Lesion of vocal cord    Leukoplakia of larynx 11/15/2016   Long-term use of aspirin therapy 10/02/2018   Malfunction of penile prosthesis ([place]) 01/14/2019   Malignant neoplasm of skin 01/28/2021   Formatting of this note might be different from the original. Jan 22, 2003 Entered By: Marland Kitchen J Comment: of skin - removed x2 inpast Jan 22, 2003 Entered By: Marland Kitchen J Comment: of skin - removed x2 inpast   Melanoma of back ([place])    melanoma on back   Memory loss 03/08/2020   Mood disorder in conditions classified elsewhere 02/02/2022   Myalgia 03/08/2020   Myocardial infarction [facility]) 2000   2 stents   Obstructive chronic bronchitis with exacerbation ([place]) 05/16/2022   Oropharyngeal dysphagia 08/12/2018   Other ill-defined and unknown causes of morbidity and mortality 11/13/1993   Formatting of this note might be different from the original. Jan 31, 2008 Entered By: ARMOUR,ROSS B Comment: lumbar, 8/08 Jan 22, 2003 Entered By: Marland Kitchen J Comment: x44yr in work place  quit 352yrago in 2000 Jan 22, 2003  Entered By: MAMarland Kitchen Comment: [id number] work place  quit [id number] in 2000 Jan 31, 2008 Entered By: ARMOStephannie Petersomment: lumbar, 8/08   PAD (peripheral artery disease) (HCC)[place]/04/2017   Peripheral vascular disease (HCC)[place] iliac artery clot   Peyronie's disease 06/12/2019   Pharyngoesophageal dysphagia 08/12/2018   Pneumonia 02/2015   Pneumothorax 06/07/2020   Preoperative clearance 04/03/2018   Squamous cell carcinoma of larynx (HCC)[place]/31/2018   Testicular pain, left 01/14/2019   Throat cancer (HCC)[place] Tobacco use disorder 06/12/2019    Tobacco History: Social History   Tobacco Use  Smoking Status Every Day   Packs/day: 1.00   Years: 50.00   Total pack years: 50.00   Types: Cigarettes  Smokeless Tobacco Never  Tobacco Comments   down to .5 ppd  08/17/2022 hfb   Ready to quit: No Counseling given: Yes Tobacco comments: down to .5 ppd  08/17/2022 hfb   Outpatient Medications Prior to Visit  Medication Sig Dispense Refill   ALPRAZolam (XANAX) 0.25 MG tablet TAKE 1 TABLET BY MOUTH ONCE DAILY AS NEEDED FOR ANXIETY 30 tablet 1   cephALEXin (KEFLEX) 500  MG capsule Take 1 capsule (500 mg total) by mouth 3 (three) times daily. 30 capsule 0   clopidogrel (PLAVIX) 75 MG tablet Take 1 tablet by mouth once daily 90 tablet 0   cyclobenzaprine (FLEXERIL) 10 MG tablet Take 1 tablet by mouth three times daily as needed for muscle spasm 90 tablet 0   DULoxetine (CYMBALTA) 60 MG capsule Take 1 capsule by mouth twice daily 180 capsule 1   finasteride (PROSCAR) 5 MG tablet Take 1 tablet by mouth once daily 90 tablet 0   gabapentin (NEURONTIN) 400 MG capsule TAKE 1 CAPSULE BY MOUTH THREE TIMES DAILY 270 capsule 1   ipratropium-albuterol (DUONEB) 0.5-2.5 (3) MG/3ML SOLN USE 1 AMPULE IN NEBULIZER 4 TIMES DAILY 360 mL 1   Lancets (ONETOUCH DELICA PLUS [id number]) MISC USE 1  TO CHECK GLUCOSE THREE TIMES DAILY 100 each 0   metFORMIN (GLUCOPHAGE) 1000 MG tablet Take 1 tablet (1,000 mg total)  by mouth 2 (two) times daily with a meal. 180 tablet 2   metoprolol tartrate (LOPRESSOR) 50 MG tablet Take 1 tablet by mouth twice daily 180 tablet 0   MYRBETRIQ 25 MG TB24 tablet Take 1 tablet by mouth once daily 30 tablet 0   nitroGLYCERIN (NITROSTAT) 0.4 MG SL tablet DISSOLVE ONE TABLET UNDER THE TONGUE EVERY 5 TO 10 MINUTES PRIOR TO ACTIVITIES WHICH MIGHT PRECIPITATE AN ATTACK 25 tablet 0   nystatin cream (MYCOSTATIN) Apply 1 application topically 2 (two) times daily as needed for dry skin.     Omega-3 Fatty Acids (FISH OIL) 1000 MG CAPS Take 2 capsules (2,000 mg total) by mouth in the morning and at bedtime. 180 capsule 12   omeprazole (PRILOSEC) 20 MG capsule Take 1 capsule by mouth once daily 90 capsule 0   ONETOUCH ULTRA test strip USE 1 STRIP TO CHECK GLUCOSE TWICE DAILY 100 each 0   oxyCODONE-acetaminophen (PERCOCET/ROXICET) 5-325 MG tablet Take 1 tablet by mouth every 6 (six) hours as needed for severe pain. 120 tablet 0   rosuvastatin (CRESTOR) 40 MG tablet Take 1 tablet by mouth once daily 90 tablet 1   tamsulosin (FLOMAX) 0.4 MG CAPS capsule TAKE 2 CAPSULES BY MOUTH ONCE DAILY AFTER SUPPER 180 capsule 1   TOUJEO SOLOSTAR 300 UNIT/ML Solostar Pen INJECT 30 UNITS SUBCUTANEOUSLY ONCE DAILY 6 mL 3   traZODone (DESYREL) 150 MG tablet Take 1 tablet by mouth once daily 90 tablet 0   VENTOLIN HFA 108 (90 Base) MCG/ACT inhaler Inhale 2 puffs into the lungs every 6 (six) hours as needed for shortness of breath or wheezing. 18 g 3   fluticasone furoate-vilanterol (BREO ELLIPTA) 100-25 MCG/ACT AEPB INHALE 1 PUFF BY MOUTH ONCE DAILY 60 each 2   SPIRIVA RESPIMAT 2.5 MCG/ACT AERS Inhale 2 puffs into the lungs daily. 4 g 2   No facility-administered medications prior to visit.     Review of Systems:   Constitutional:   No  weight loss, night sweats,  Fevers, chills, + fatigue, or  lassitude.  HEENT:   No headaches,  Difficulty swallowing,  Tooth/dental problems, or  Sore throat,                 No sneezing, itching, ear ache,  +nasal congestion, post nasal drip,   CV:  No chest pain,  Orthopnea, PND, swelling in lower extremities, anasarca, dizziness, palpitations, syncope.   GI  No heartburn, indigestion, abdominal pain, nausea, vomiting, diarrhea, change in bowel habits, loss of appetite, bloody stools.   Resp:  No chest wall deformity  Skin: no rash or lesions.  GU: no dysuria, change in color of urine, no urgency or frequency.  No flank pain, no hematuria   MS:  No joint pain or swelling.  No decreased range of motion.  No back pain.    Physical Exam  BP 120/70 (BP Location: Left Arm, Patient Position: Sitting, Cuff Size: Normal)   Pulse 60   Temp (!) 97.4 F (36.3 C) (Oral)   Ht '5\' 10"'$  (1.778 m)   Wt 193 lb 6.4 oz (87.7 kg)   SpO2 94% Comment: 85 when first got to room, came up to 94% with rest.  BMI 27.75 kg/m   GEN: A/Ox3; pleasant , NAD, well nourished    HEENT:  [place]/AT,   NOSE-clear, THROAT-clear, no lesions, no postnasal drip or exudate noted.   NECK:  Supple w/ fair ROM; no JVD; normal carotid impulses w/o bruits; no thyromegaly or nodules palpated; no lymphadenopathy.    RESP  Clear  P & A; w/o, wheezes/ rales/ or rhonchi. no accessory muscle use, no dullness to percussion  CARD:  RRR, no m/r/g, no peripheral edema, pulses intact, no cyanosis or clubbing.  GI:   Soft & nt; nml bowel sounds; no organomegaly or masses detected.   Musco: Warm bil, no deformities or joint swelling noted.   Neuro: alert, no focal deficits noted.    Skin: Warm, no lesions or rashes      BMET   BNP No results found for: "BNP"  ProBNP    Component Value Date/Time   PROBNP 34.0 08/17/2022 1015    Imaging: DG Chest 2 View  Result Date: 08/17/2022 CLINICAL DATA:  COPD EXAM: CHEST - 2 VIEW COMPARISON:  Radiograph 06/06/2022, CT 04/30/2020 FINDINGS: Normal cardiac silhouette. Lungs are mildly hyperinflated. Calcified granuloma noted in the RIGHT lower lobe  as demonstrated on comparison CT. Degenerative osteophytosis of the spine. Anterior cervical fusion. IMPRESSION: No acute cardiopulmonary process. Electronically Signed   By: Suzy Bouchard M.D.   On: 08/17/2022 10:27          No data to display          No results found for: "NITRICOXIDE"      Assessment & Plan:   COPD (chronic obstructive pulmonary disease) ([place]) Severe COPD patient is encouraged on smoking cessation.  We will check chest x-ray today.  We will change inhalers over to neb medicines to see if he can have better symptom control.  Plan  Patient Instructions  Stop BREO and Spiriva  Begin Budesonide and Brovana Neb Twice daily  Albuterol inhaler or neb As needed   Begin Yupelri Neb daily  Chest xray today  CT chest to follow lung nodules  Labs today .  Flu shot today  Work on not smoking  Continue on Oxygen 2l/m At bedtime   Follow up with Dr. Chase Caller in 4 weeks with PFT and As needed   Please contact office for sooner follow up if symptoms do not improve or worsen or seek emergency care         Chronic respiratory failure with hypoxia ([place]) Continue on oxygen 2 L at bedtime  Tobacco use disorder Smoking cessation discussed  Lung nodules Pulmonary nodules noted on previous CT scan.  Will check CT chest without contrast.  Patient has a contrast allergy.    I spent  41  minutes dedicated to the care of this patient on the date of this encounter to include pre-visit review  of records, face-to-face time with the patient discussing conditions above, post visit ordering of testing, clinical documentation with the electronic health record, making appropriate referrals as documented, and communicating necessary findings to members of the patients care team.   Rexene Edison, NP 08/17/2022

## 2022-08-17 NOTE — Patient Instructions (Addendum)
Stop BREO and Spiriva  Begin Budesonide and Brovana Neb Twice daily  Albuterol inhaler or neb As needed   Begin [employer] daily  Chest xray today  CT chest to follow lung nodules  Labs today .  Flu shot today  Work on not smoking  Continue on Oxygen 2l/m At bedtime   Follow up with Dr. Chase Caller in 4 weeks with PFT and As needed   Please contact office for sooner follow up if symptoms do not improve or worsen or seek emergency care

## 2022-08-17 NOTE — Assessment & Plan Note (Signed)
Continue on oxygen 2 L at bedtime 

## 2022-08-17 NOTE — Assessment & Plan Note (Signed)
Pulmonary nodules noted on previous CT scan.  Will check CT chest without contrast.  Patient has a contrast allergy.

## 2022-08-17 NOTE — Assessment & Plan Note (Signed)
Smoking cessation discussed 

## 2022-08-18 DIAGNOSIS — Z6827 Body mass index (BMI) 27.0-27.9, adult: Secondary | ICD-10-CM | POA: Diagnosis not present

## 2022-08-18 DIAGNOSIS — E663 Overweight: Secondary | ICD-10-CM | POA: Diagnosis not present

## 2022-08-18 DIAGNOSIS — R030 Elevated blood-pressure reading, without diagnosis of hypertension: Secondary | ICD-10-CM | POA: Diagnosis not present

## 2022-08-18 DIAGNOSIS — S93401A Sprain of unspecified ligament of right ankle, initial encounter: Secondary | ICD-10-CM | POA: Diagnosis not present

## 2022-08-18 DIAGNOSIS — S93402A Sprain of unspecified ligament of left ankle, initial encounter: Secondary | ICD-10-CM | POA: Diagnosis not present

## 2022-08-21 ENCOUNTER — Telehealth: Payer: Self-pay

## 2022-08-21 DIAGNOSIS — S92155A Nondisplaced avulsion fracture (chip fracture) of left talus, initial encounter for closed fracture: Secondary | ICD-10-CM | POA: Diagnosis not present

## 2022-08-21 DIAGNOSIS — S93492A Sprain of other ligament of left ankle, initial encounter: Secondary | ICD-10-CM | POA: Diagnosis not present

## 2022-08-21 NOTE — Patient Outreach (Signed)
  Care Coordination   Initial Visit Note   08/21/2022 Name: Don [name] MRN: [id number] DOB: [birth year]  Don [name] is a 71 y.o. year old male who sees Cox, Kirsten, MD for primary care. I spoke with  Don [name] by phone today. Patient provided permission for me to speak with wife.   What matters to the patients health and wellness today?   Placed call to patient and wife today to review and offer [facility] care coordination program.  Wife reports they are doing well except for cost of medications.  Reports they are active with Don [name] the pharmacist. Declines any need for care coordination at this time  SDOH assessments and interventions completed:  No     Care Coordination Interventions Activated:  No  Care Coordination Interventions:  No, not indicated   Follow up plan: No further intervention required.   Encounter Outcome:  Pt. Refused   Tomasa Rand, RN, BSN, CEN [facility] [employer] [phone]

## 2022-08-21 NOTE — Chronic Care Management (AMB) (Signed)
Chronic Care Management Pharmacy Assistant   Name: GRABIEL SCHMUTZ  MRN: [id number] DOB: 15-Jan-1951  Reason for Encounter: Disease State/ Diabetes  Recent office visits:  None  Recent consult visits:  08-17-2022 [name], [name], NP (Pulmonary). Glucose, Bld= 239. WBC= 11.4, Hemo= 12.9, HCT= 38.8, RDW= 16.1. DG chest completed. CT chest w/o contrast and pulmonary function ordered. Spirometry with graph completed. START BROVANA Take 2 mLs (15 mcg total) by nebulization 2 (two) times daily, PULMICORT Take 2 mLs (0.5 mg total) by nebulization in the morning and at bedtime, YUPELRI Take 3 mLs (175 mcg total) by nebulization daily. STOP BREO ELLIPTA and SPIRIVA RESPIMAT. Flu vaccine given.   Hospital visits:  None in previous 6 months  Medications: Outpatient Encounter Medications as of 08/21/2022  Medication Sig   ALPRAZolam (XANAX) 0.25 MG tablet TAKE 1 TABLET BY MOUTH ONCE DAILY AS NEEDED FOR ANXIETY   arformoterol (BROVANA) 15 MCG/2ML NEBU Take 2 mLs (15 mcg total) by nebulization 2 (two) times daily.   budesonide (PULMICORT) 0.5 MG/2ML nebulizer solution Take 2 mLs (0.5 mg total) by nebulization in the morning and at bedtime.   cephALEXin (KEFLEX) 500 MG capsule Take 1 capsule (500 mg total) by mouth 3 (three) times daily.   clopidogrel (PLAVIX) 75 MG tablet Take 1 tablet by mouth once daily   cyclobenzaprine (FLEXERIL) 10 MG tablet Take 1 tablet by mouth three times daily as needed for muscle spasm   DULoxetine (CYMBALTA) 60 MG capsule Take 1 capsule by mouth twice daily   finasteride (PROSCAR) 5 MG tablet Take 1 tablet by mouth once daily   gabapentin (NEURONTIN) 400 MG capsule TAKE 1 CAPSULE BY MOUTH THREE TIMES DAILY   ipratropium-albuterol (DUONEB) 0.5-2.5 (3) MG/3ML SOLN USE 1 AMPULE IN NEBULIZER 4 TIMES DAILY   Lancets (ONETOUCH DELICA PLUS [id number]) MISC USE 1  TO CHECK GLUCOSE THREE TIMES DAILY   metFORMIN (GLUCOPHAGE) 1000 MG tablet Take 1 tablet (1,000 mg total) by  mouth 2 (two) times daily with a meal.   metoprolol tartrate (LOPRESSOR) 50 MG tablet Take 1 tablet by mouth twice daily   MYRBETRIQ 25 MG TB24 tablet Take 1 tablet by mouth once daily   nitroGLYCERIN (NITROSTAT) 0.4 MG SL tablet DISSOLVE ONE TABLET UNDER THE TONGUE EVERY 5 TO 10 MINUTES PRIOR TO ACTIVITIES WHICH MIGHT PRECIPITATE AN ATTACK   nystatin cream (MYCOSTATIN) Apply 1 application topically 2 (two) times daily as needed for dry skin.   Omega-3 Fatty Acids (FISH OIL) 1000 MG CAPS Take 2 capsules (2,000 mg total) by mouth in the morning and at bedtime.   omeprazole (PRILOSEC) 20 MG capsule Take 1 capsule by mouth once daily   ONETOUCH ULTRA test strip USE 1 STRIP TO CHECK GLUCOSE TWICE DAILY   oxyCODONE-acetaminophen (PERCOCET/ROXICET) 5-325 MG tablet Take 1 tablet by mouth every 6 (six) hours as needed for severe pain.   revefenacin (YUPELRI) 175 MCG/3ML nebulizer solution Take 3 mLs (175 mcg total) by nebulization daily.   rosuvastatin (CRESTOR) 40 MG tablet Take 1 tablet by mouth once daily   tamsulosin (FLOMAX) 0.4 MG CAPS capsule TAKE 2 CAPSULES BY MOUTH ONCE DAILY AFTER SUPPER   TOUJEO SOLOSTAR 300 UNIT/ML Solostar Pen INJECT 30 UNITS SUBCUTANEOUSLY ONCE DAILY   traZODone (DESYREL) 150 MG tablet Take 1 tablet by mouth once daily   VENTOLIN HFA 108 (90 Base) MCG/ACT inhaler Inhale 2 puffs into the lungs every 6 (six) hours as needed for shortness of breath or wheezing.  No facility-administered encounter medications on file as of 08/21/2022.  Recent Relevant Labs: Lab Results  Component Value Date/Time   HGBA1C 7.9 (H) 05/19/2022 08:23 AM   HGBA1C 6.8 (H) 12/16/2020 09:13 AM   MICROALBUR 30 05/23/2021 09:39 AM   MICROALBUR 80 08/23/2020 02:39 PM    Kidney Function Lab Results  Component Value Date/Time   CREATININE 1.01 08/17/2022 10:15 AM   CREATININE 0.99 05/19/2022 08:23 AM   GFR 74.13 08/17/2022 10:15 AM   GFRNONAA 87 12/16/2020 09:13 AM   GFRAA 100 12/16/2020 09:13 AM     08-21-2022: 1st attempt left VM 08-24-2022: 2nd attempt left VM 08-29-2022: 3rd attempt left VM  Adherence Review: Is the patient currently on a STATIN medication? Yes Is the patient currently on ACE/ARB medication? No Does the patient have >5 day gap between last estimated fill dates? No  Care Gaps: Last eye exam / Retinopathy Screening? None Last Annual Wellness Visit? None Last Diabetic Foot Exam? None   Star Rating Drugs: Rosuvastatin 40 mg- Last filled 07-31-2022 90 DS Metformin 1000 mg- Last filled 07-03-2022 90 DS  [place] Clinical Pharmacist Assistant [phone]

## 2022-08-29 ENCOUNTER — Telehealth: Payer: Self-pay | Admitting: Family Medicine

## 2022-08-29 ENCOUNTER — Ambulatory Visit: Payer: Medicare Other | Admitting: Nurse Practitioner

## 2022-08-29 NOTE — Telephone Encounter (Signed)
I have schedule the patient a video visit with you tomorrow, I told the wife I would call  her in the AM if you say it has to be in person.

## 2022-08-29 NOTE — Telephone Encounter (Signed)
Pts wife called stating he got a little choked up at dinner last night... she stated he aspirated in his sleep and she cant tell if he is wheezing or not. She cant bring him in until tomorrow before 1pm. I told her a nurse may call to follow up with more questions.

## 2022-08-30 ENCOUNTER — Ambulatory Visit: Payer: Medicare Other

## 2022-08-30 ENCOUNTER — Telehealth (INDEPENDENT_AMBULATORY_CARE_PROVIDER_SITE_OTHER): Payer: Medicare Other | Admitting: Family Medicine

## 2022-08-30 ENCOUNTER — Encounter: Payer: Self-pay | Admitting: Family Medicine

## 2022-08-30 VITALS — BP 135/78 | HR 65

## 2022-08-30 DIAGNOSIS — J441 Chronic obstructive pulmonary disease with (acute) exacerbation: Secondary | ICD-10-CM | POA: Diagnosis not present

## 2022-08-30 MED ORDER — LEVOFLOXACIN 500 MG PO TABS: 500.0000 mg | ORAL_TABLET | Freq: Every day | ORAL | 0 refills | Status: AC

## 2022-08-30 MED ORDER — PREDNISONE 20 MG PO TABS: ORAL_TABLET | ORAL | 0 refills | Status: AC

## 2022-08-30 NOTE — Progress Notes (Signed)
Virtual Visit via Video Note   This visit type was conducted due to national recommendations for restrictions regarding the COVID-19 Pandemic (e.g. social distancing) in an effort to limit this patient's exposure and mitigate transmission in our community.  Due to his co-morbid illnesses, this patient is at least at moderate risk for complications without adequate follow up.  This format is felt to be most appropriate for this patient at this time.  All issues noted in this document were discussed and addressed.  A limited physical exam was performed with this format.  A verbal consent was obtained for the virtual visit.   Date:  09/06/2022   ID:  Don [name], DOB 1951/06/08, MRN [id number]  Patient Location: Home Provider Location: Office/Clinic  PCP:  [name], MD   Evaluation Performed:  Follow-Up Visit  Chief Complaint:  Cough, and Chest Congestion  History of Present Illness:    Don [name] is a 71 y.o. male with Cough and chest congestion. Wheezing and dyspnea. No fever, chills, sore throat.   The patient does have symptoms concerning for COVID-19 infection. Patient wife Covid tested him this morning and it was negative.    Past Medical History:  Diagnosis Date   Absence of bladder continence 01/08/2022   Acute bilateral low back pain without sciatica 01/08/2022   Anxiety state 02/02/2022   Arthritis    Asymptomatic LV dysfunction 10/18/2017   B12 deficiency anemia 08/25/2021   Basal cell carcinoma    Benign prostatic hyperplasia with incomplete bladder emptying 01/14/2019   Blood in ear canal, left 05/13/2021   Bone pain 03/08/2020   Cancer ([place])    throat - 1997, throat - 2018   Cardiomyopathy, secondary ([place]) 12/01/2016   Overview:  EF 47% 12/26/16   Chest pain syndrome 11/02/2017   Chronic coronary artery disease 10/18/2017   Chronic cough 05/16/2022   Chronic ischemic heart disease 11/13/1998   Jan 22, 2003 Entered By: Marland Kitchen J Comment:   massive Mi in 2000 per patient hsitory, 2nd Mi in 2001Mar 11, 2004 Entered By: Marland Kitchen J Comment: Had stent placedin 2000,cath/angio in 2001   Chronic neck pain 05/16/2022   Chronic respiratory failure with hypoxia ([place]) 05/16/2022   CKD (chronic kidney disease) stage 3, GFR 30-59 ml/min (HCC) 07/25/2019   COPD (chronic obstructive pulmonary disease) ([place])    Coronary artery disease    Coronary artery disease involving native coronary artery of native heart with angina pectoris ([place]) 12/01/2016   Overview:  He has hx of IWMI in remote past, last cath in 2012 at [facility] showed chronic total occlusion of previously stented RCA, with good collaterals, a 40% LAD stenosis, inferior hypokinesis and EF 45%.    He's been lost to Cardiology f/u since 2015, but has not had any recurrent events   Deficiency anemia 08/25/2021   Depression    Diabetes mellitus ([place]) 03/08/2020   Diabetic polyneuropathy associated with type 2 diabetes mellitus ([place]) 05/16/2022   Diaphoresis 05/16/2022   Diverticulitis 05/02/2020   Dyslipidemia 12/01/2016   Emphysema lung ([place])    Erectile dysfunction due to diseases classified elsewhere 06/12/2019   Essential hypertension 10/18/2017   GERD (gastroesophageal reflux disease)    Hoarseness 11/15/2016   Hyperlipidemia    Hypertension    Insomnia 01/28/2021   Iron deficiency anemia due to chronic blood loss 08/25/2021   Laceration of thumb, left 12/17/2015   Left flank pain 03/14/2022   Left lower quadrant abdominal tenderness without rebound tenderness 03/14/2022  Lesion of vocal cord    Leukoplakia of larynx 11/15/2016   Long-term use of aspirin therapy 10/02/2018   Malfunction of penile prosthesis ([place]) 01/14/2019   Malignant neoplasm of skin 01/28/2021   Formatting of this note might be different from the original. Jan 22, 2003 Entered By: Marland Kitchen J Comment: of skin - removed x2 inpast Jan 22, 2003 Entered By: Marland Kitchen J Comment: of skin - removed x2 inpast    Melanoma of back ([place])    melanoma on back   Memory loss 03/08/2020   Mood disorder in conditions classified elsewhere 02/02/2022   Myalgia 03/08/2020   Myocardial infarction [facility]) 2000   2 stents   Obstructive chronic bronchitis with exacerbation ([place]) 05/16/2022   Oropharyngeal dysphagia 08/12/2018   Other ill-defined and unknown causes of morbidity and mortality 11/13/1993   Formatting of this note might be different from the original. Jan 31, 2008 Entered By: ARMOUR,ROSS B Comment: lumbar, 8/08 Jan 22, 2003 Entered By: Marland Kitchen J Comment: x48yr in work place  quit 336yrago in 2000 Jan 22, 2003 Entered By: MAMarland Kitchen Comment: [id number] work place  quit [id number] in 2000 Jan 31, 2008 Entered By: ARMOStephannie Petersomment: lumbar, 8/08   PAD (peripheral artery disease) (HCC)[place]/04/2017   Peripheral vascular disease (HCC)[place] iliac artery clot   Peyronie's disease 06/12/2019   Pharyngoesophageal dysphagia 08/12/2018   Pneumonia 02/2015   Pneumothorax 06/07/2020   Preoperative clearance 04/03/2018   Squamous cell carcinoma of larynx (HCC)[place]/31/2018   Testicular pain, left 01/14/2019   Throat cancer (HCC)[place] Tobacco use disorder 06/12/2019    Past Surgical History:  Procedure Laterality Date   ABDOMINAL ANGIOGRAM N/A 01/13/2015   Procedure: ABDOMINAL ANGIOGRAM;  Surgeon: ToddRosetta Posner;  [facility];  Service: Cardiovascular;  Laterality: N/A;   APPENDECTOMY     BACK SURGERY     CARDIAC CATHETERIZATION  2000/2012   [place] stents [place]   ESOPHAGOGASTRODUODENOSCOPY     KNEE SURGERY Left    MICROLARYNGOSCOPY WITH CO2 LASER AND EXCISION OF VOCAL CORD LESION N/A 11/23/2016   Procedure: MICROLARYNGOSCOPY  AND EXCISION OF VOCAL CORD LESION;  Surgeon: JohnMelissa Montane;  [facility];  Laterality: N/A;   MICROLARYNGOSCOPY WITH CO2 LASER AND EXCISION OF VOCAL CORD LESION N/A 01/11/2017   Procedure:  MICROLARYNGOSCOPY WITH CO2 LASER AND EXCISION OF VOCAL CORD LESION;  Surgeon: JohnMelissa Montane;  Location: [facility]: ENT;  Laterality: N/A;   MICROLARYNGOSCOPY WITH LASER N/A 03/16/2015   Procedure: MICROLARYNGOSCOPY ;  Surgeon: JohnMelissa Montane;  [facility]  Service: ENT;  Laterality: N/A;   peyronie's surgery     SHOULDER SURGERY Right    rotator cuff   SPINE SURGERY  09/2020   cervical surgery. steel plate, bone spurs, allograft.   THROAT SURGERY  1997   cancer removed    Family History  Problem Relation Age of Onset   Cancer Mother        bone   Hypertension Mother    Stroke Father    Hypertension Father    Healthy Child    Cancer Other        brain   Diabetes Other     Social History   Socioeconomic History   Marital status: Married    Spouse name:  Not on file   Number of children: 4   Years of education: Not on file   Highest education level: GED or equivalent  Occupational History   Occupation: retired  Tobacco Use   Smoking status: Every Day    Packs/day: 1.00    Years: 50.00    Total pack years: 50.00    Types: Cigarettes   Smokeless tobacco: Never   Tobacco comments:    down to .5 ppd  08/17/2022 hfb  Vaping Use   Vaping Use: Former  Substance and Sexual Activity   Alcohol use: No    Alcohol/week: 0.0 standard drinks of alcohol   Drug use: No   Sexual activity: Not on file  Other Topics Concern   Not on file  Social History Narrative   Lives with wife       One level      Right hand   Social Determinants of Health   Financial Resource Strain: Low Risk  (07/19/2022)   Overall Financial Resource Strain (CARDIA)    Difficulty of Paying Living Expenses: Not hard at all  Food Insecurity: Not on file  Transportation Needs: No Transportation Needs (07/19/2022)   PRAPARE - Hydrologist (Medical): No    Lack of Transportation (Non-Medical): No  Physical Activity: Not on file  Stress: Not on file  Social  Connections: Not on file  Intimate Partner Violence: Not on file    Outpatient Medications Prior to Visit  Medication Sig Dispense Refill   arformoterol (BROVANA) 15 MCG/2ML NEBU Take 2 mLs (15 mcg total) by nebulization 2 (two) times daily. 120 mL 6   budesonide (PULMICORT) 0.5 MG/2ML nebulizer solution Take 2 mLs (0.5 mg total) by nebulization in the morning and at bedtime. 60 mL 12   clopidogrel (PLAVIX) 75 MG tablet Take 1 tablet by mouth once daily 90 tablet 0   cyclobenzaprine (FLEXERIL) 10 MG tablet Take 1 tablet by mouth three times daily as needed for muscle spasm 90 tablet 0   DULoxetine (CYMBALTA) 60 MG capsule Take 1 capsule by mouth twice daily 180 capsule 1   finasteride (PROSCAR) 5 MG tablet Take 1 tablet by mouth once daily 90 tablet 0   gabapentin (NEURONTIN) 400 MG capsule TAKE 1 CAPSULE BY MOUTH THREE TIMES DAILY 270 capsule 1   ipratropium-albuterol (DUONEB) 0.5-2.5 (3) MG/3ML SOLN USE 1 AMPULE IN NEBULIZER 4 TIMES DAILY 360 mL 1   Lancets (ONETOUCH DELICA PLUS [id number]) MISC USE 1  TO CHECK GLUCOSE THREE TIMES DAILY 100 each 0   metFORMIN (GLUCOPHAGE) 1000 MG tablet Take 1 tablet (1,000 mg total) by mouth 2 (two) times daily with a meal. 180 tablet 2   MYRBETRIQ 25 MG TB24 tablet Take 1 tablet by mouth once daily 30 tablet 0   nitroGLYCERIN (NITROSTAT) 0.4 MG SL tablet DISSOLVE ONE TABLET UNDER THE TONGUE EVERY 5 TO 10 MINUTES PRIOR TO ACTIVITIES WHICH MIGHT PRECIPITATE AN ATTACK 25 tablet 0   nystatin cream (MYCOSTATIN) Apply 1 application topically 2 (two) times daily as needed for dry skin.     Omega-3 Fatty Acids (FISH OIL) 1000 MG CAPS Take 2 capsules (2,000 mg total) by mouth in the morning and at bedtime. 180 capsule 12   omeprazole (PRILOSEC) 20 MG capsule Take 1 capsule by mouth once daily 90 capsule 0   ONETOUCH ULTRA test strip USE 1 STRIP TO CHECK GLUCOSE TWICE DAILY 100 each 0   oxyCODONE-acetaminophen (PERCOCET/ROXICET) 5-325 MG tablet Take 1  tablet by mouth  every 6 (six) hours as needed for severe pain. 120 tablet 0   revefenacin (YUPELRI) 175 MCG/3ML nebulizer solution Take 3 mLs (175 mcg total) by nebulization daily. 90 mL 11   rosuvastatin (CRESTOR) 40 MG tablet Take 1 tablet by mouth once daily 90 tablet 1   tamsulosin (FLOMAX) 0.4 MG CAPS capsule TAKE 2 CAPSULES BY MOUTH ONCE DAILY AFTER SUPPER 180 capsule 1   TOUJEO SOLOSTAR 300 UNIT/ML Solostar Pen INJECT 30 UNITS SUBCUTANEOUSLY ONCE DAILY 6 mL 3   traZODone (DESYREL) 150 MG tablet Take 1 tablet by mouth once daily 90 tablet 0   VENTOLIN HFA 108 (90 Base) MCG/ACT inhaler Inhale 2 puffs into the lungs every 6 (six) hours as needed for shortness of breath or wheezing. 18 g 3   ALPRAZolam (XANAX) 0.25 MG tablet TAKE 1 TABLET BY MOUTH ONCE DAILY AS NEEDED FOR ANXIETY 30 tablet 1   metoprolol tartrate (LOPRESSOR) 50 MG tablet Take 1 tablet by mouth twice daily 180 tablet 0   cephALEXin (KEFLEX) 500 MG capsule Take 1 capsule (500 mg total) by mouth 3 (three) times daily. 30 capsule 0   No facility-administered medications prior to visit.    Allergies:   Penicillins, Doxycycline, Iodinated contrast media, Penicillin g, and Tramadol   Social History   Tobacco Use   Smoking status: Every Day    Packs/day: 1.00    Years: 50.00    Total pack years: 50.00    Types: Cigarettes   Smokeless tobacco: Never   Tobacco comments:    down to .5 ppd  08/17/2022 hfb  Vaping Use   Vaping Use: Former  Substance Use Topics   Alcohol use: No    Alcohol/week: 0.0 standard drinks of alcohol   Drug use: No     ROS   Labs/Other Tests and Data Reviewed:    Recent Labs: 05/19/2022: ALT 20 08/17/2022: BUN 12; Creatinine, Ser 1.01; Hemoglobin 12.9; Platelets 228.0; Potassium 3.9; Pro B Natriuretic peptide (BNP) 34.0; Sodium 137   Recent Lipid Panel Lab Results  Component Value Date/Time   CHOL 117 05/19/2022 08:23 AM   TRIG 481 (H) 05/19/2022 08:23 AM   HDL 23 (L) 05/19/2022 08:23 AM   CHOLHDL 5.1 (H)  05/19/2022 08:23 AM   LDLCALC 27 05/19/2022 08:23 AM    Wt Readings from Last 3 Encounters:  08/17/22 193 lb 6.4 oz (87.7 kg)  06/22/22 194 lb (88 kg)  06/02/22 198 lb (89.8 kg)     Objective:    Vital Signs:  BP 135/78   Pulse 65   SpO2 91%    Physical Exam Vitals reviewed.  Pulmonary:     Effort: Pulmonary effort is normal.     Comments: No respiratory distress.     ASSESSMENT & PLAN:    Problem List Items Addressed This Visit       Respiratory   COPD exacerbation ([place]) - Primary    Start on levaquin and prednisone. Continue duoneb nebulizers four times a day.  Continue Brovana and pulmicort.       Relevant Medications   levofloxacin (LEVAQUIN) 500 MG tablet   predniSONE (DELTASONE) 20 MG tablet  .  No orders of the defined types were placed in this encounter.    Meds ordered this encounter  Medications   levofloxacin (LEVAQUIN) 500 MG tablet    Sig: Take 1 tablet (500 mg total) by mouth daily for 7 days.    Dispense:  7 tablet  Refill:  0   predniSONE (DELTASONE) 20 MG tablet    Sig: Take 3 tablets (60 mg total) by mouth daily with breakfast for 3 days, THEN 2 tablets (40 mg total) daily with breakfast for 3 days, THEN 1 tablet (20 mg total) daily with breakfast for 3 days.    Dispense:  18 tablet    Refill:  0    COVID-19 Education: The signs and symptoms of COVID-19 were discussed with the patient and how to seek care for testing (follow up with PCP or arrange E-visit). The importance of social distancing was discussed today.   I spent 10 minutes dedicated to the care of this patient on the date of this encounter to include face-to-face time with the patient, as well as: reviewing chart.   Follow Up:  In Person prn  Signed, [name], MD  09/06/2022 8:24 PM    [place]

## 2022-09-01 ENCOUNTER — Other Ambulatory Visit: Payer: Self-pay | Admitting: Family Medicine

## 2022-09-04 ENCOUNTER — Other Ambulatory Visit: Payer: Self-pay | Admitting: Family Medicine

## 2022-09-06 NOTE — Assessment & Plan Note (Signed)
Start on levaquin and prednisone. Continue duoneb nebulizers four times a day.  Continue Brovana and pulmicort.

## 2022-09-07 ENCOUNTER — Other Ambulatory Visit: Payer: Self-pay

## 2022-09-07 MED ORDER — OXYCODONE-ACETAMINOPHEN 5-325 MG PO TABS: 1.0000 | ORAL_TABLET | Freq: Four times a day (QID) | ORAL | 0 refills | Status: DC | PRN

## 2022-09-13 ENCOUNTER — Other Ambulatory Visit: Payer: Medicare Other

## 2022-09-14 ENCOUNTER — Other Ambulatory Visit: Payer: Self-pay | Admitting: Family Medicine

## 2022-09-14 DIAGNOSIS — J441 Chronic obstructive pulmonary disease with (acute) exacerbation: Secondary | ICD-10-CM

## 2022-09-16 DIAGNOSIS — J9610 Chronic respiratory failure, unspecified whether with hypoxia or hypercapnia: Secondary | ICD-10-CM | POA: Diagnosis not present

## 2022-09-16 DIAGNOSIS — J449 Chronic obstructive pulmonary disease, unspecified: Secondary | ICD-10-CM | POA: Diagnosis not present

## 2022-09-20 ENCOUNTER — Other Ambulatory Visit: Payer: Self-pay | Admitting: Family Medicine

## 2022-09-21 ENCOUNTER — Telehealth: Payer: Self-pay | Admitting: *Deleted

## 2022-09-21 ENCOUNTER — Other Ambulatory Visit: Payer: Self-pay | Admitting: *Deleted

## 2022-09-21 MED ORDER — BUDESONIDE 0.25 MG/2ML IN SUSP: 0.2500 mg | Freq: Two times a day (BID) | RESPIRATORY_TRACT | 11 refills | Status: DC

## 2022-09-21 MED ORDER — REVEFENACIN 175 MCG/3ML IN SOLN: 175.0000 ug | Freq: Every day | RESPIRATORY_TRACT | 11 refills | Status: DC

## 2022-09-21 MED ORDER — ARFORMOTEROL TARTRATE 15 MCG/2ML IN NEBU: 15.0000 ug | INHALATION_SOLUTION | Freq: Two times a day (BID) | RESPIRATORY_TRACT | 6 refills | Status: DC

## 2022-09-21 NOTE — Telephone Encounter (Signed)
Faxed [facility] with all demographics.  Nothing further needed.

## 2022-09-21 NOTE — Telephone Encounter (Signed)
Called and spoke with patient's wife, Anne Ng [facility]) regarding CT scan and PFT that were not done.  She stated that he had a flare up of his COPD and was unable to do it.  His PCP put him on Levaquin and Prednisone and he is better.  She will call GI to reschedule the CT scan and I scheduled his PFT for 10/26/22 at 11 am, advised to arrive by 10:45 am for check in.  I asked that she call the office to schedule his OV f/u after she schedules his CT scan.  She verbalized understanding.  She wanted to see if she could get his nebs cheaper by mail, advised we would send them to [facility] and see if they will be cheaper there.  She said the [place] alone was $252 after insurance.  All 3 nebs were $350.  He has them, but would like to get them cheaper if possible.  Scripts sent to [facility].

## 2022-09-22 ENCOUNTER — Ambulatory Visit: Payer: Medicare Other | Admitting: Adult Health

## 2022-09-22 ENCOUNTER — Ambulatory Visit (INDEPENDENT_AMBULATORY_CARE_PROVIDER_SITE_OTHER): Payer: Medicare Other

## 2022-09-22 DIAGNOSIS — Z23 Encounter for immunization: Secondary | ICD-10-CM

## 2022-09-22 NOTE — Progress Notes (Signed)
   COVID Vaccine given per order, patient tolerated well.    [name], LPN 9:13 AM

## 2022-09-25 ENCOUNTER — Other Ambulatory Visit: Payer: Self-pay | Admitting: Family Medicine

## 2022-09-28 ENCOUNTER — Telehealth: Payer: Self-pay

## 2022-09-28 NOTE — Chronic Care Management (AMB) (Signed)
Chronic Care Management Pharmacy Assistant   Name: Don [name]  MRN: [id number] DOB: 05-26-1951  Reason for Encounter: Disease State/ Diabetes  Recent office visits:  09-22-2022 Don Noe, LPN. Covid booster given.  08-30-2022 Don Brome, MD. FINISHED keflex. START levofloxacin 500 mg daily and prednisone 20 mg follow instructions.  Recent consult visits:  08-17-2022 Don [name], Don Mu, NP (Pulmonary). Glucose= 239. WBC= 11.4, Hemo= 12.9, HCT= 38.8, RDW= 16.1. STOP Breo ellipta and spiriva. START brovana 2 mls by nebulization twice daily, pulmicort Take 2 mLs (0.5 mg total) by nebulization in the morning and at bedtime and Yupelri 175 mcg by nebulization daily. Pulmonary function and CT Chest Wo Contrast ordered.Flu vaccine given.   Hospital visits:  None in previous 6 months  Medications: Outpatient Encounter Medications as of 09/28/2022  Medication Sig   ALPRAZolam (XANAX) 0.25 MG tablet TAKE 1 TABLET BY MOUTH ONCE DAILY AS NEEDED FOR ANXIETY   arformoterol (BROVANA) 15 MCG/2ML NEBU Take 2 mLs (15 mcg total) by nebulization 2 (two) times daily.   budesonide (PULMICORT) 0.25 MG/2ML nebulizer solution Take 2 mLs (0.25 mg total) by nebulization in the morning and at bedtime.   clopidogrel (PLAVIX) 75 MG tablet Take 1 tablet by mouth once daily   cyclobenzaprine (FLEXERIL) 10 MG tablet Take 1 tablet by mouth three times daily as needed for muscle spasm   DULoxetine (CYMBALTA) 60 MG capsule Take 1 capsule by mouth twice daily   finasteride (PROSCAR) 5 MG tablet Take 1 tablet by mouth once daily   gabapentin (NEURONTIN) 400 MG capsule TAKE 1 CAPSULE BY MOUTH THREE TIMES DAILY   ipratropium-albuterol (DUONEB) 0.5-2.5 (3) MG/3ML SOLN USE 1 AMPULE IN NEBULIZER 4 TIMES DAILY   Lancets (ONETOUCH DELICA PLUS [id number]) MISC USE 1  TO CHECK GLUCOSE THREE TIMES DAILY   metFORMIN (GLUCOPHAGE) 1000 MG tablet Take 1 tablet (1,000 mg total) by mouth 2 (two) times daily with a meal.    metoprolol tartrate (LOPRESSOR) 50 MG tablet Take 1 tablet by mouth twice daily   MYRBETRIQ 25 MG TB24 tablet Take 1 tablet by mouth once daily   nitroGLYCERIN (NITROSTAT) 0.4 MG SL tablet DISSOLVE ONE TABLET UNDER THE TONGUE EVERY 5 TO 10 MINUTES PRIOR TO ACTIVITIES WHICH MIGHT PRECIPITATE AN ATTACK   nystatin cream (MYCOSTATIN) Apply 1 application topically 2 (two) times daily as needed for dry skin.   Omega-3 Fatty Acids (FISH OIL) 1000 MG CAPS Take 2 capsules (2,000 mg total) by mouth in the morning and at bedtime.   omeprazole (PRILOSEC) 20 MG capsule Take 1 capsule by mouth once daily   ONETOUCH ULTRA test strip USE 1 STRIP TO CHECK GLUCOSE TWICE DAILY   oxyCODONE-acetaminophen (PERCOCET/ROXICET) 5-325 MG tablet Take 1 tablet by mouth every 6 (six) hours as needed for severe pain.   revefenacin (YUPELRI) 175 MCG/3ML nebulizer solution Take 3 mLs (175 mcg total) by nebulization daily.   rosuvastatin (CRESTOR) 40 MG tablet Take 1 tablet by mouth once daily   tamsulosin (FLOMAX) 0.4 MG CAPS capsule TAKE 2 CAPSULES BY MOUTH ONCE DAILY AFTER SUPPER   TOUJEO SOLOSTAR 300 UNIT/ML Solostar Pen INJECT 30 UNITS SUBCUTANEOUSLY ONCE DAILY   traZODone (DESYREL) 150 MG tablet Take 1 tablet by mouth once daily   VENTOLIN HFA 108 (90 Base) MCG/ACT inhaler INHALE 2 PUFFS BY MOUTH EVERY 6 HOURS AS NEEDED FOR SHORTNESS OF BREATH FOR WHEEZING   No facility-administered encounter medications on file as of 09/28/2022.  Recent Relevant Labs: Lab Results  Component Value Date/Time   HGBA1C 7.9 (H) 05/19/2022 08:23 AM   HGBA1C 6.8 (H) 12/16/2020 09:13 AM   MICROALBUR 30 05/23/2021 09:39 AM   MICROALBUR 80 08/23/2020 02:39 PM    Kidney Function Lab Results  Component Value Date/Time   CREATININE 1.01 08/17/2022 10:15 AM   CREATININE 0.99 05/19/2022 08:23 AM   GFR 74.13 08/17/2022 10:15 AM   GFRNONAA 87 12/16/2020 09:13 AM   GFRAA 100 12/16/2020 09:13 AM    09-28-2022: 1st attempt left  VM 10-02-2022: 2nd attempt left VM 10-03-2022: 3rd attempt left VM  Care Gaps: Last eye exam / Retinopathy Screening? None Last Annual Wellness Visit? none Last Diabetic Foot Exam? None  Star Rating Drugs: Rosuvastatin 40 mg- Last filled 07-31-2022 90 DS Metformin 1000 mg- Last filled 07-03-2022 90 DS  [place] Clinical Pharmacist Assistant [phone]

## 2022-09-29 ENCOUNTER — Ambulatory Visit (INDEPENDENT_AMBULATORY_CARE_PROVIDER_SITE_OTHER): Payer: Medicare Other | Admitting: Family Medicine

## 2022-09-29 VITALS — BP 136/78 | HR 88 | Temp 96.7°F | Resp 18 | Ht 70.0 in | Wt 193.0 lb

## 2022-09-29 DIAGNOSIS — E782 Mixed hyperlipidemia: Secondary | ICD-10-CM | POA: Diagnosis not present

## 2022-09-29 DIAGNOSIS — R413 Other amnesia: Secondary | ICD-10-CM | POA: Diagnosis not present

## 2022-09-29 DIAGNOSIS — M8580 Other specified disorders of bone density and structure, unspecified site: Secondary | ICD-10-CM

## 2022-09-29 DIAGNOSIS — I110 Hypertensive heart disease with heart failure: Secondary | ICD-10-CM

## 2022-09-29 DIAGNOSIS — R443 Hallucinations, unspecified: Secondary | ICD-10-CM

## 2022-09-29 DIAGNOSIS — J9611 Chronic respiratory failure with hypoxia: Secondary | ICD-10-CM | POA: Diagnosis not present

## 2022-09-29 DIAGNOSIS — E1142 Type 2 diabetes mellitus with diabetic polyneuropathy: Secondary | ICD-10-CM | POA: Diagnosis not present

## 2022-09-29 DIAGNOSIS — I25119 Atherosclerotic heart disease of native coronary artery with unspecified angina pectoris: Secondary | ICD-10-CM

## 2022-09-29 DIAGNOSIS — I739 Peripheral vascular disease, unspecified: Secondary | ICD-10-CM

## 2022-09-29 MED ORDER — GABAPENTIN 400 MG PO CAPS: 400.0000 mg | ORAL_CAPSULE | Freq: Two times a day (BID) | ORAL | 1 refills | Status: DC

## 2022-09-29 NOTE — Patient Instructions (Addendum)
Decrease trazodone 150 mg 1/2 daily. Hold flexeril. Decrease percocet to twice daily  Continue xanax.  Continue gabapentin 400 mg twice daily

## 2022-09-29 NOTE — Progress Notes (Signed)
Acute Office Visit  Subjective:    Patient ID: Don [name], male    DOB: Mar 25, 1951, 71 y.o.   MRN: [id number]  Chief Complaint  Patient presents with   Cough   Hallucinations    HPI: Patient is in today for increased cough and lethargy.  He also has been having hallucinations.  His oxygen saturation had been in the upper 90's.  Has been going on for 1 1/2 week.  Increased sleepiness during the day. Patient is on some sedative medicines.   DM: Checking sugars once daily. [id number].  Taking metformin 1000 mg once twice daily and toujeo 1-2 times per week if that.   Hypertension: Metoprolol tartrate 50 mcg one twice daily. BP 129/70. HR good.  Hyperlipidemia: Fish oil 2 capsules twice daily,rosuvastatin 40 mg before bed,   COPD: on yupelri, pulmicort, and ventolin.   Past Medical History:  Diagnosis Date   Absence of bladder continence 01/08/2022   Acute bilateral low back pain without sciatica 01/08/2022   Anxiety state 02/02/2022   Arthritis    Asymptomatic LV dysfunction 10/18/2017   B12 deficiency anemia 08/25/2021   Basal cell carcinoma    Benign prostatic hyperplasia with incomplete bladder emptying 01/14/2019   Blood in ear canal, left 05/13/2021   Bone pain 03/08/2020   Cancer ([place])    throat - 1997, throat - 2018   Cardiomyopathy, secondary ([place]) 12/01/2016   Overview:  EF 47% 12/26/16   Chest pain syndrome 11/02/2017   Chronic coronary artery disease 10/18/2017   Chronic cough 05/16/2022   Chronic ischemic heart disease 11/13/1998   Jan 22, 2003 Entered By: Marland Kitchen J Comment:  massive Mi in 2000 per patient hsitory, 2nd Mi in 2001Mar 11, 2004 Entered By: Marland Kitchen J Comment: Had stent placedin 2000,cath/angio in 2001   Chronic neck pain 05/16/2022   Chronic respiratory failure with hypoxia ([place]) 05/16/2022   CKD (chronic kidney disease) stage 3, GFR 30-59 ml/min (HCC) 07/25/2019   COPD (chronic obstructive pulmonary disease) ([place])    Coronary artery  disease    Coronary artery disease involving native coronary artery of native heart with angina pectoris ([place]) 12/01/2016   Overview:  He has hx of IWMI in remote past, last cath in 2012 at [facility] showed chronic total occlusion of previously stented RCA, with good collaterals, a 40% LAD stenosis, inferior hypokinesis and EF 45%.    He's been lost to Cardiology f/u since 2015, but has not had any recurrent events   Deficiency anemia 08/25/2021   Depression    Diabetes mellitus ([place]) 03/08/2020   Diabetic polyneuropathy associated with type 2 diabetes mellitus ([place]) 05/16/2022   Diaphoresis 05/16/2022   Diverticulitis 05/02/2020   Dyslipidemia 12/01/2016   Emphysema lung ([place])    Erectile dysfunction due to diseases classified elsewhere 06/12/2019   Essential hypertension 10/18/2017   GERD (gastroesophageal reflux disease)    Hoarseness 11/15/2016   Hyperlipidemia    Hypertension    Insomnia 01/28/2021   Iron deficiency anemia due to chronic blood loss 08/25/2021   Laceration of thumb, left 12/17/2015   Left flank pain 03/14/2022   Left lower quadrant abdominal tenderness without rebound tenderness 03/14/2022   Lesion of vocal cord    Leukoplakia of larynx 11/15/2016   Long-term use of aspirin therapy 10/02/2018   Malfunction of penile prosthesis ([place]) 01/14/2019   Malignant neoplasm of skin 01/28/2021   Formatting of this note might be different from the original. Jan 22, 2003 Entered By: Marland Kitchen J Comment:  of skin - removed x2 inpast Jan 22, 2003 Entered By: Marland Kitchen J Comment: of skin - removed x2 inpast   Melanoma of back ([place])    melanoma on back   Memory loss 03/08/2020   Mood disorder in conditions classified elsewhere 02/02/2022   Myalgia 03/08/2020   Myocardial infarction [facility]) 2000   2 stents   Obstructive chronic bronchitis with exacerbation ([place]) 05/16/2022   Oropharyngeal dysphagia 08/12/2018   Other ill-defined and unknown causes of morbidity and mortality 11/13/1993    Formatting of this note might be different from the original. Jan 31, 2008 Entered By: ARMOUR,ROSS B Comment: lumbar, 8/08 Jan 22, 2003 Entered By: Marland Kitchen J Comment: x2yr in work place  quit [id number] in 2000 Jan 22, 2003 Entered By: MAMarland Kitchen Comment: [id number] work place  quit [id number] in 2000 Jan 31, 2008 Entered By: ARMOStephannie Petersomment: lumbar, 8/08   PAD (peripheral artery disease) (HCC)[place]/04/2017   Peripheral vascular disease (HCC)[place] iliac artery clot   Peyronie's disease 06/12/2019   Pharyngoesophageal dysphagia 08/12/2018   Pneumonia 02/2015   Pneumothorax 06/07/2020   Preoperative clearance 04/03/2018   Squamous cell carcinoma of larynx (HCC)[place]/31/2018   Testicular pain, left 01/14/2019   Throat cancer (HCC)[place] Tobacco use disorder 06/12/2019    Past Surgical History:  Procedure Laterality Date   ABDOMINAL ANGIOGRAM N/A 01/13/2015   Procedure: ABDOMINAL ANGIOGRAM;  Surgeon: ToddRosetta Posner;  [facility];  Service: Cardiovascular;  Laterality: N/A;   APPENDECTOMY     BACK SURGERY     CARDIAC CATHETERIZATION  2000/2012   with stents in [place] bilateral   ESOPHAGOGASTRODUODENOSCOPY     KNEE SURGERY Left    MICROLARYNGOSCOPY WITH CO2 LASER AND EXCISION OF VOCAL CORD LESION N/A 11/23/2016   Procedure: MICROLARYNGOSCOPY  AND EXCISION OF VOCAL CORD LESION;  Surgeon: JohnMelissa Montane;  [facility];  Laterality: N/A;   MICROLARYNGOSCOPY WITH CO2 LASER AND EXCISION OF VOCAL CORD LESION N/A 01/11/2017   Procedure: MICROLARYNGOSCOPY WITH CO2 LASER AND EXCISION OF VOCAL CORD LESION;  Surgeon: JohnMelissa Montane;  [facility];  Laterality: N/A;   MICROLARYNGOSCOPY WITH LASER N/A 03/16/2015   Procedure: MICROLARYNGOSCOPY ;  Surgeon: JohnMelissa Montane;  [facility]  Service: ENT;  Laterality: N/A;   peyronie's surgery     SHOULDER SURGERY Right    rotator cuff   SPINE SURGERY   09/2020   cervical surgery. steel plate, bone spurs, allograft.   THROAT SURGERY  1997   cancer removed    Family History  Problem Relation Age of Onset   Cancer Mother        bone   Hypertension Mother    Stroke Father    Hypertension Father    Healthy Child    Cancer Other        brain   Diabetes Other     Social History   Socioeconomic History   Marital status: Married    Spouse name: Not on file   Number of children: 4   Years of education: Not on file   Highest education level: GED or equivalent  Occupational History   Occupation: retired  Tobacco Use   Smoking status: Every Day    Packs/day: 1.00    Years: 50.00  Total pack years: 50.00    Types: Cigarettes   Smokeless tobacco: Never   Tobacco comments:    down to .5 ppd  08/17/2022 hfb  Vaping Use   Vaping Use: Former  Substance and Sexual Activity   Alcohol use: No    Alcohol/week: 0.0 standard drinks of alcohol   Drug use: No   Sexual activity: Not on file  Other Topics Concern   Not on file  Social History Narrative   Lives with wife       One level      Right hand   Social Determinants of Health   Financial Resource Strain: Low Risk  (07/19/2022)   Overall Financial Resource Strain (CARDIA)    Difficulty of Paying Living Expenses: Not hard at all  Food Insecurity: Not on file  Transportation Needs: No Transportation Needs (07/19/2022)   PRAPARE - Hydrologist (Medical): No    Lack of Transportation (Non-Medical): No  Physical Activity: Not on file  Stress: Not on file  Social Connections: Not on file  Intimate Partner Violence: Not on file    Outpatient Medications Prior to Visit  Medication Sig Dispense Refill   BREO ELLIPTA 100-25 MCG/ACT AEPB Inhale 1 puff into the lungs daily.     arformoterol (BROVANA) 15 MCG/2ML NEBU Take 2 mLs (15 mcg total) by nebulization 2 (two) times daily. 120 mL 6   budesonide (PULMICORT) 0.25 MG/2ML nebulizer solution Take 2  mLs (0.25 mg total) by nebulization in the morning and at bedtime. 60 mL 11   clopidogrel (PLAVIX) 75 MG tablet Take 1 tablet by mouth once daily 90 tablet 0   cyclobenzaprine (FLEXERIL) 10 MG tablet Take 1 tablet by mouth three times daily as needed for muscle spasm 90 tablet 0   DULoxetine (CYMBALTA) 60 MG capsule Take 1 capsule by mouth twice daily 180 capsule 1   finasteride (PROSCAR) 5 MG tablet Take 1 tablet by mouth once daily 90 tablet 0   ipratropium-albuterol (DUONEB) 0.5-2.5 (3) MG/3ML SOLN USE 1 AMPULE IN NEBULIZER 4 TIMES DAILY 360 mL 1   Lancets (ONETOUCH DELICA PLUS [id number]) MISC USE 1  TO CHECK GLUCOSE THREE TIMES DAILY 100 each 0   metFORMIN (GLUCOPHAGE) 1000 MG tablet Take 1 tablet (1,000 mg total) by mouth 2 (two) times daily with a meal. 180 tablet 2   metoprolol tartrate (LOPRESSOR) 50 MG tablet Take 1 tablet by mouth twice daily 180 tablet 0   MYRBETRIQ 25 MG TB24 tablet Take 1 tablet by mouth once daily 30 tablet 0   nitroGLYCERIN (NITROSTAT) 0.4 MG SL tablet DISSOLVE ONE TABLET UNDER THE TONGUE EVERY 5 TO 10 MINUTES PRIOR TO ACTIVITIES WHICH MIGHT PRECIPITATE AN ATTACK 25 tablet 0   nystatin cream (MYCOSTATIN) Apply 1 application topically 2 (two) times daily as needed for dry skin.     Omega-3 Fatty Acids (FISH OIL) 1000 MG CAPS Take 2 capsules (2,000 mg total) by mouth in the morning and at bedtime. 180 capsule 12   omeprazole (PRILOSEC) 20 MG capsule Take 1 capsule by mouth once daily 90 capsule 0   oxyCODONE-acetaminophen (PERCOCET/ROXICET) 5-325 MG tablet Take 1 tablet by mouth every 6 (six) hours as needed for severe pain. 120 tablet 0   revefenacin (YUPELRI) 175 MCG/3ML nebulizer solution Take 3 mLs (175 mcg total) by nebulization daily. 90 mL 11   rosuvastatin (CRESTOR) 40 MG tablet Take 1 tablet by mouth once daily 90 tablet 1   tamsulosin (  FLOMAX) 0.4 MG CAPS capsule TAKE 2 CAPSULES BY MOUTH ONCE DAILY AFTER SUPPER 180 capsule 1   TOUJEO SOLOSTAR 300 UNIT/ML  Solostar Pen INJECT 30 UNITS SUBCUTANEOUSLY ONCE DAILY 6 mL 3   traZODone (DESYREL) 150 MG tablet Take 1 tablet by mouth once daily 90 tablet 0   VENTOLIN HFA 108 (90 Base) MCG/ACT inhaler INHALE 2 PUFFS BY MOUTH EVERY 6 HOURS AS NEEDED FOR SHORTNESS OF BREATH FOR WHEEZING 18 g 0   ALPRAZolam (XANAX) 0.25 MG tablet TAKE 1 TABLET BY MOUTH ONCE DAILY AS NEEDED FOR ANXIETY 30 tablet 0   gabapentin (NEURONTIN) 400 MG capsule TAKE 1 CAPSULE BY MOUTH THREE TIMES DAILY 270 capsule 1   ONETOUCH ULTRA test strip USE 1 STRIP TO CHECK GLUCOSE TWICE DAILY 100 each 0   No facility-administered medications prior to visit.    Allergies  Allergen Reactions   Penicillins Rash and Hives    Has patient had a PCN reaction causing immediate rash, facial/tongue/throat swelling, SOB or lightheadedness with hypotension:unsure Has patient had a PCN reaction causing severe rash involving mucus membranes or skin necrosis:unsure Has patient had a PCN reaction that required hospitalization:No Has patient had a PCN reaction occurring within the last 10 years:No If all of the above answers are "NO", then may proceed with Cephalosporin use.  rash   Doxycycline Rash   Iodinated Contrast Media Rash    Also developed blisters Also developed blisters   Penicillin G Rash   Tramadol Rash    Review of Systems  Constitutional:  Positive for diaphoresis (sometimes.) and fatigue. Negative for chills and fever.  HENT:  Positive for congestion and sinus pressure. Negative for ear pain and sore throat.   Respiratory:  Positive for cough and shortness of breath.   Cardiovascular:  Positive for chest pain (with coughing). Negative for leg swelling.  Gastrointestinal:  Negative for abdominal pain, constipation, diarrhea, nausea and vomiting.  Endocrine: Negative for polydipsia, polyphagia and polyuria.  Genitourinary:  Positive for frequency and urgency. Negative for dysuria.  Neurological:  Positive for headaches. Negative for  dizziness and weakness.  Psychiatric/Behavioral:  Positive for confusion and hallucinations. Negative for dysphoric mood.        Objective:    Physical Exam Constitutional:      Appearance: Normal appearance.  HENT:     Right Ear: Tympanic membrane, ear canal and external ear normal.     Left Ear: Tympanic membrane, ear canal and external ear normal.     Nose: Nose normal. No congestion or rhinorrhea.     Mouth/Throat:     Mouth: Mucous membranes are moist.     Pharynx: No oropharyngeal exudate or posterior oropharyngeal erythema.  Cardiovascular:     Rate and Rhythm: Normal rate and regular rhythm.     Heart sounds: Normal heart sounds.  Pulmonary:     Effort: Pulmonary effort is normal. No respiratory distress.     Breath sounds: Normal breath sounds. No wheezing.  Lymphadenopathy:     Cervical: No cervical adenopathy.  Neurological:     Mental Status: He is alert and oriented to person, place, and time.  Psychiatric:        Mood and Affect: Mood normal.        Behavior: Behavior normal.     BP 136/78   Pulse 88   Temp (!) 96.7 F (35.9 C)   Resp 18   Ht _0  (1.778 m)   Wt 193 lb (87.5 kg)   SpO2  95%   BMI 27.69 kg/m  Wt Readings from Last 3 Encounters:  09/29/22 193 lb (87.5 kg)  08/17/22 193 lb 6.4 oz (87.7 kg)  06/22/22 194 lb (88 kg)    Health Maintenance Due  Topic Date Due   Medicare Annual Wellness (AWV)  Never done   FOOT EXAM  Never done   OPHTHALMOLOGY EXAM  Never done   Hepatitis C Screening  Never done   Zoster Vaccines- Shingrix (1 of 2) Never done   Lung Cancer Screening  Never done   Pneumonia Vaccine 45+ Years old (3 - PPSV23 or PCV20) 09/30/2016   COVID-19 Vaccine (2 - Moderna risk series) 11/09/2020    There are no preventive care reminders to display for this patient.   Lab Results  Component Value Date   TSH 4.950 (H) 09/29/2022   Lab Results  Component Value Date   WBC 12.9 (H) 09/29/2022   HGB 13.1 09/29/2022   HCT  40.1 09/29/2022   MCV 79 09/29/2022   PLT 221 09/29/2022   Lab Results  Component Value Date   NA 141 09/29/2022   K 4.3 09/29/2022   CO2 27 09/29/2022   GLUCOSE 197 (H) 09/29/2022   BUN 12 09/29/2022   CREATININE 0.89 09/29/2022   BILITOT 0.3 09/29/2022   ALKPHOS 87 09/29/2022   AST 13 09/29/2022   ALT 12 09/29/2022   PROT 6.9 09/29/2022   ALBUMIN 4.2 09/29/2022   CALCIUM 9.4 09/29/2022   ANIONGAP 7 01/10/2017   EGFR 91 09/29/2022   GFR 74.13 08/17/2022   Lab Results  Component Value Date   CHOL 103 09/29/2022   Lab Results  Component Value Date   HDL 24 (L) 09/29/2022   Lab Results  Component Value Date   LDLCALC 35 09/29/2022   Lab Results  Component Value Date   TRIG 292 (H) 09/29/2022   Lab Results  Component Value Date   CHOLHDL 4.3 09/29/2022   Lab Results  Component Value Date   HGBA1C 7.5 (H) 09/29/2022       Assessment & Plan:   Problem List Items Addressed This Visit       Cardiovascular and Mediastinum   Coronary artery disease involving native coronary artery of native heart with angina pectoris ([place])    Continue metoprolol, crestor, and plavix      Hypertensive heart disease with heart failure ([place])    Continue metoprolol, crestor, and plavix      Peripheral vascular disease ([place])    Continue crestor and plavix.         Respiratory   Chronic respiratory failure with hypoxia (HCC)    Continue oxygen.        Endocrine   Diabetic polyneuropathy associated with type 2 diabetes mellitus (HCC)    Control: not quite at goal.  Recommend check sugars fasting daily. Recommend check feet daily. Recommend annual eye exams. Medicines: recommend add farxiga to metformin. Xigduo xr 03/999 mg 2 daily.  Continue to work on eating a healthy diet and exercise.  Labs drawn today.         Relevant Medications   gabapentin (NEURONTIN) 400 MG capsule   Other Relevant Orders   Hemoglobin A1c (Completed)   Cardiovascular Risk Assessment  (Completed)     Other   Hyperlipidemia    Not at goal. Be sure patient is taking fish oil 2 gm twice daily and crestor 40 mg before bed.  Continue to work on eating a healthy diet and exercise.  Labs drawn today.        Relevant Orders   Lipid panel (Completed)   TSH (Completed)   Memory loss - Primary    Decrease trazodone 150 mg 1/2 daily. Hold flexeril. Decrease percocet to twice daily  Continue xanax.  Continue gabapentin 400 mg twice daily  Check labs      Relevant Orders   CBC with Differential/Platelet (Completed)   Comprehensive metabolic panel (Completed)   Hemoglobin A1c (Completed)   Lipid panel (Completed)   TSH (Completed)   B12 and Folate Panel (Completed)   Methylmalonic acid, serum (Completed)   VITAMIN D 25 Hydroxy (Vit-D Deficiency, Fractures) (Completed)   Hallucination    Decrease trazodone 150 mg 1/2 daily. Hold flexeril. Decrease percocet to twice daily  Continue xanax.  Continue gabapentin 400 mg twice daily       Other Visit Diagnoses     Osteopenia, unspecified location       Relevant Orders   VITAMIN D 25 Hydroxy (Vit-D Deficiency, Fractures) (Completed)      Meds ordered this encounter  Medications   gabapentin (NEURONTIN) 400 MG capsule    Sig: Take 1 capsule (400 mg total) by mouth 2 (two) times daily.    Dispense:  1 capsule    Refill:  1    Orders Placed This Encounter  Procedures   CBC with Differential/Platelet   Comprehensive metabolic panel   Hemoglobin A1c   Lipid panel   TSH   B12 and Folate Panel   Methylmalonic acid, serum   VITAMIN D 25 Hydroxy (Vit-D Deficiency, Fractures)   Cardiovascular Risk Assessment     Follow-up: Return if symptoms worsen or fail to improve.  An After Visit Summary was printed and given to the patient.  [name], MD [facility] [phone]

## 2022-10-02 ENCOUNTER — Other Ambulatory Visit: Payer: Self-pay | Admitting: Physician Assistant

## 2022-10-02 ENCOUNTER — Other Ambulatory Visit: Payer: Self-pay

## 2022-10-02 ENCOUNTER — Telehealth: Payer: Self-pay

## 2022-10-02 MED ORDER — LEVOFLOXACIN 500 MG PO TABS: 500.0000 mg | ORAL_TABLET | Freq: Every day | ORAL | 0 refills | Status: AC

## 2022-10-02 MED ORDER — ALPRAZOLAM 0.25 MG PO TABS: 0.2500 mg | ORAL_TABLET | Freq: Every day | ORAL | 0 refills | Status: DC | PRN

## 2022-10-02 NOTE — Telephone Encounter (Signed)
Patient wife called and stated he seen Dr. Tobie Poet on Friday and she told them called if his symptoms got worst, she stated he is now coughing up green mucus. Wanted to know if he could get an antibiotic sent in please. Please advise

## 2022-10-03 ENCOUNTER — Other Ambulatory Visit: Payer: Self-pay | Admitting: Physician Assistant

## 2022-10-03 ENCOUNTER — Other Ambulatory Visit: Payer: Self-pay | Admitting: Family Medicine

## 2022-10-03 ENCOUNTER — Encounter: Payer: Self-pay | Admitting: Nurse Practitioner

## 2022-10-03 ENCOUNTER — Other Ambulatory Visit: Payer: Self-pay

## 2022-10-03 MED ORDER — ONETOUCH ULTRA VI STRP: 1.0000 | ORAL_STRIP | Freq: Two times a day (BID) | 0 refills | Status: DC

## 2022-10-03 NOTE — Assessment & Plan Note (Addendum)
Not at goal. Be sure patient is taking fish oil 2 gm twice daily and crestor 40 mg before bed.  Continue to work on eating a healthy diet and exercise.  Labs drawn today.   

## 2022-10-03 NOTE — Assessment & Plan Note (Signed)
Decrease trazodone 150 mg 1/2 daily. Hold flexeril. Decrease percocet to twice daily  Continue xanax.  Continue gabapentin 400 mg twice daily  Check labs

## 2022-10-04 LAB — COMPREHENSIVE METABOLIC PANEL
ALT: 12 IU/L (ref 0–44)
AST: 13 IU/L (ref 0–40)
Albumin/Globulin Ratio: 1.6 (ref 1.2–2.2)
Albumin: 4.2 g/dL (ref 3.8–4.8)
Alkaline Phosphatase: 87 IU/L (ref 44–121)
BUN/Creatinine Ratio: 13 (ref 10–24)
BUN: 12 mg/dL (ref 8–27)
Bilirubin Total: 0.3 mg/dL (ref 0.0–1.2)
CO2: 27 mmol/L (ref 20–29)
Calcium: 9.4 mg/dL (ref 8.6–10.2)
Chloride: 99 mmol/L (ref 96–106)
Creatinine, Ser: 0.89 mg/dL (ref 0.76–1.27)
Globulin, Total: 2.7 g/dL (ref 1.5–4.5)
Glucose: 197 mg/dL — ABNORMAL HIGH (ref 70–99)
Potassium: 4.3 mmol/L (ref 3.5–5.2)
Sodium: 141 mmol/L (ref 134–144)
Total Protein: 6.9 g/dL (ref 6.0–8.5)
eGFR: 91 mL/min/{1.73_m2} (ref >59)

## 2022-10-04 LAB — CBC WITH DIFFERENTIAL/PLATELET
Basophils Absolute: 0.1 10*3/uL (ref 0.0–0.2)
Basos: 1 %
EOS (ABSOLUTE): 0.2 10*3/uL (ref 0.0–0.4)
Eos: 1 %
Hematocrit: 40.1 % (ref 37.5–51.0)
Hemoglobin: 13.1 g/dL (ref 13.0–17.7)
Immature Grans (Abs): 0.1 10*3/uL (ref 0.0–0.1)
Immature Granulocytes: 1 %
Lymphocytes Absolute: 3 10*3/uL (ref 0.7–3.1)
Lymphs: 24 %
MCH: 25.7 pg — ABNORMAL LOW (ref 26.6–33.0)
MCHC: 32.7 g/dL (ref 31.5–35.7)
MCV: 79 fL (ref 79–97)
Monocytes Absolute: 0.9 10*3/uL (ref 0.1–0.9)
Monocytes: 7 %
Neutrophils Absolute: 8.6 10*3/uL — ABNORMAL HIGH (ref 1.4–7.0)
Neutrophils: 66 %
Platelets: 221 10*3/uL (ref 150–450)
RBC: 5.1 x10E6/uL (ref 4.14–5.80)
RDW: 15.2 % (ref 11.6–15.4)
WBC: 12.9 10*3/uL — ABNORMAL HIGH (ref 3.4–10.8)

## 2022-10-04 LAB — TSH: TSH: 4.95 u[IU]/mL — ABNORMAL HIGH (ref 0.450–4.500)

## 2022-10-04 LAB — LIPID PANEL
Chol/HDL Ratio: 4.3 ratio (ref 0.0–5.0)
Cholesterol, Total: 103 mg/dL (ref 100–199)
HDL: 24 mg/dL — ABNORMAL LOW (ref >39)
LDL Chol Calc (NIH): 35 mg/dL (ref 0–99)
Triglycerides: 292 mg/dL — ABNORMAL HIGH (ref 0–149)
VLDL Cholesterol Cal: 44 mg/dL — ABNORMAL HIGH (ref 5–40)

## 2022-10-04 LAB — B12 AND FOLATE PANEL
Folate: 9.6 ng/mL (ref >3.0)
Vitamin B-12: 221 pg/mL — ABNORMAL LOW (ref 232–1245)

## 2022-10-04 LAB — VITAMIN D 25 HYDROXY (VIT D DEFICIENCY, FRACTURES): Vit D, 25-Hydroxy: 17.6 ng/mL — ABNORMAL LOW (ref 30.0–100.0)

## 2022-10-04 LAB — HEMOGLOBIN A1C
Est. average glucose Bld gHb Est-mCnc: 169 mg/dL
Hgb A1c MFr Bld: 7.5 % — ABNORMAL HIGH (ref 4.8–5.6)

## 2022-10-04 LAB — METHYLMALONIC ACID, SERUM: Methylmalonic Acid: 452 nmol/L — ABNORMAL HIGH (ref 0–378)

## 2022-10-04 LAB — CARDIOVASCULAR RISK ASSESSMENT

## 2022-10-06 ENCOUNTER — Telehealth: Payer: Self-pay | Admitting: Primary Care

## 2022-10-06 MED ORDER — PREDNISONE 10 MG PO TABS: ORAL_TABLET | ORAL | 0 refills | Status: DC

## 2022-10-06 NOTE — Telephone Encounter (Signed)
Patient called on-service. Hx severe COPD. Not feeling better. Still has cough and chest congestion especially on the right side. O2 94%. Currently on Levaquin, prednisone not prescribed recently but usually helps. RX prednisone taper sent. Advised if symptoms worsen present to UC or ED for evaluation. He has a follow-up with primary pulmonologist in December along with PFTs and wife would like to speak about daily antibiotics or something else to help decreased frequency of his exacerbations.

## 2022-10-08 ENCOUNTER — Encounter: Payer: Self-pay | Admitting: Family Medicine

## 2022-10-08 DIAGNOSIS — R443 Hallucinations, unspecified: Secondary | ICD-10-CM

## 2022-10-08 HISTORY — DX: Hallucinations, unspecified: R44.3

## 2022-10-08 NOTE — Assessment & Plan Note (Signed)
Continue crestor and plavix.

## 2022-10-08 NOTE — Assessment & Plan Note (Signed)
Continue oxygen. 

## 2022-10-08 NOTE — Assessment & Plan Note (Signed)
Decrease trazodone 150 mg 1/2 daily. Hold flexeril. Decrease percocet to twice daily  Continue xanax.  Continue gabapentin 400 mg twice daily

## 2022-10-08 NOTE — Assessment & Plan Note (Signed)
Continue metoprolol, crestor, and plavix

## 2022-10-08 NOTE — Assessment & Plan Note (Signed)
Control: not quite at goal.  Recommend check sugars fasting daily. Recommend check feet daily. Recommend annual eye exams. Medicines: recommend add farxiga to metformin. Xigduo xr 03/999 mg 2 daily.  Continue to work on eating a healthy diet and exercise.  Labs drawn today.

## 2022-10-08 NOTE — Progress Notes (Signed)
Blood count abnormal. Wbc elevated.  Liver function normal.  Kidney function normal.  Cholesterol: trigs elevated but improved. Recommend Continue fish oil 1 gm 2 capsules twice daily (prefer prescription: vascepa), decrease crestor to 10 mg before bed, so we can add fenofibrate 160 mg daily. Hopefully this will help bring his triglycerides down.  HBA1C: 7.5. improved.  Thyroid abnormal. Recommend levothyroxine to 25 mcg once daily in am. Repeat tsh and Free T4 in 6 weeks.  B12 deficiency. Recommend b12 shots weekly. Repeat b12 level in 6 weeks. Vitamin D low. Recommend Vitamin D 50K twice weekly.

## 2022-10-09 ENCOUNTER — Other Ambulatory Visit: Payer: Self-pay

## 2022-10-09 MED ORDER — LEVOTHYROXINE SODIUM 25 MCG PO TABS: 25.0000 ug | ORAL_TABLET | Freq: Every day | ORAL | 0 refills | Status: DC

## 2022-10-09 MED ORDER — VITAMIN D (ERGOCALCIFEROL) 1.25 MG (50000 UNIT) PO CAPS: 50000.0000 [IU] | ORAL_CAPSULE | ORAL | 2 refills | Status: DC

## 2022-10-09 MED ORDER — OXYCODONE-ACETAMINOPHEN 5-325 MG PO TABS: 1.0000 | ORAL_TABLET | Freq: Four times a day (QID) | ORAL | 0 refills | Status: DC | PRN

## 2022-10-09 MED ORDER — CYANOCOBALAMIN 1000 MCG/ML IJ SOLN: 1000.0000 ug | INTRAMUSCULAR | 0 refills | Status: DC

## 2022-10-09 MED ORDER — FENOFIBRATE 160 MG PO TABS: 160.0000 mg | ORAL_TABLET | Freq: Every day | ORAL | 0 refills | Status: DC

## 2022-10-12 ENCOUNTER — Telehealth: Payer: Self-pay

## 2022-10-12 NOTE — Telephone Encounter (Signed)
Sent augmentin 875 mg one twice daily x 10 days.  If worsens, patient must go to ED or urgent care.  Keep appt with dr. Henrene Pastor 10/17/2022.  Notified by Lemmie Evens  Dr. Tobie Poet

## 2022-10-12 NOTE — Telephone Encounter (Signed)
Patient's wife called concerned of his blood pressure. His blood pressure is 158/85, 178/111. His wife mentioned that he denies chest pain, headache. He started to have high blood pressure after he started new medication, Levothyroxine, vitamin D and fenofibrate. His son will recheck with different blood pressure cuff and she will call us back. I asked if he can come in, but she said they can because she works until 5 pm. Please advice.

## 2022-10-16 DIAGNOSIS — J9610 Chronic respiratory failure, unspecified whether with hypoxia or hypercapnia: Secondary | ICD-10-CM | POA: Diagnosis not present

## 2022-10-16 DIAGNOSIS — J449 Chronic obstructive pulmonary disease, unspecified: Secondary | ICD-10-CM | POA: Diagnosis not present

## 2022-10-17 ENCOUNTER — Telehealth: Payer: Self-pay | Admitting: Adult Health

## 2022-10-17 NOTE — Telephone Encounter (Signed)
Called and spoke with patient's wife, Anne Ng ([place]), she states that he has been having excessive sweating more that normal.  He has had it in the past, however, it has been more often lately.  She said he has this when he has a flare up of his COPD and she noticed it when he was on the prednisone taper.  She was asking if this was something that was normal for people with COPD or a side effect of prednisone.  I reviewed the potential side effects of prednisone with her and advised that Tammy was not in the office today, however she would be back tomorrow and would get a message to her and would call her back once we heard back from her.  She would also like to discuss potentially putting him on some Daliresp to decrease his COPD flare ups, she will discuss this with Tammy at his f/u on 12/21.    Tammy, Please advise regarding potential causes of the excessive sweating.  Thank you.

## 2022-10-18 NOTE — Telephone Encounter (Signed)
Called and spoke with pateints wife Anne Ng. She verbalized understanding. Scheduled patient for a sooner follow up with Tammy [name] on 10/24/2022.   Nothing further needed.

## 2022-10-18 NOTE — Telephone Encounter (Signed)
Patient was last seen in October no steroids were given.  Unsure what is causing excessive sweating.  If he is having COPD flares may need a sooner follow-up.   Steroids can cause excessive sweating but if he is not on the prednisone this should not be contributing.  If he needs sooner follow-up can set up office visit.  Otherwise we can discuss at his follow-up visit later this month  Please contact office for sooner follow up if symptoms do not improve or worsen or seek emergency care

## 2022-10-23 ENCOUNTER — Other Ambulatory Visit: Payer: Self-pay | Admitting: Family Medicine

## 2022-10-24 ENCOUNTER — Ambulatory Visit: Payer: Medicare Other | Admitting: Adult Health

## 2022-10-26 ENCOUNTER — Ambulatory Visit (INDEPENDENT_AMBULATORY_CARE_PROVIDER_SITE_OTHER): Payer: Medicare Other | Admitting: Internal Medicine

## 2022-10-26 ENCOUNTER — Ambulatory Visit (INDEPENDENT_AMBULATORY_CARE_PROVIDER_SITE_OTHER): Payer: Medicare Other

## 2022-10-26 ENCOUNTER — Encounter: Payer: Self-pay | Admitting: Adult Health

## 2022-10-26 DIAGNOSIS — R0609 Other forms of dyspnea: Secondary | ICD-10-CM | POA: Diagnosis not present

## 2022-10-26 DIAGNOSIS — R5381 Other malaise: Secondary | ICD-10-CM

## 2022-10-26 DIAGNOSIS — M47816 Spondylosis without myelopathy or radiculopathy, lumbar region: Secondary | ICD-10-CM | POA: Diagnosis not present

## 2022-10-26 DIAGNOSIS — J449 Chronic obstructive pulmonary disease, unspecified: Secondary | ICD-10-CM

## 2022-10-26 LAB — POCT URINALYSIS DIP (CLINITEK)
Bilirubin, UA: NEGATIVE
Blood, UA: NEGATIVE
Glucose, UA: >=1000 mg/dL — AB
Ketones, POC UA: NEGATIVE mg/dL
Leukocytes, UA: NEGATIVE
Nitrite, UA: NEGATIVE
POC PROTEIN,UA: NEGATIVE
Spec Grav, UA: 1.015 (ref 1.010–1.025)
Urobilinogen, UA: 0.2 E.U./dL
pH, UA: 6 (ref 5.0–8.0)

## 2022-10-26 LAB — PULMONARY FUNCTION TEST
DL/VA % pred: 48 %
DL/VA: 1.97 ml/min/mmHg/L
DLCO cor % pred: 40 %
DLCO cor: 9.59 ml/min/mmHg
DLCO unc % pred: 38 %
DLCO unc: 9.16 ml/min/mmHg
FEF 25-75 Post: 0.59 L/sec
FEF 25-75 Pre: 0.38 L/sec
FEF2575-%Change-Post: 55 %
FEF2575-%Pred-Post: 27 %
FEF2575-%Pred-Pre: 17 %
FEV1-%Change-Post: 11 %
FEV1-%Pred-Post: 42 %
FEV1-%Pred-Pre: 38 %
FEV1-Post: 1.24 L
FEV1-Pre: 1.11 L
FEV1FVC-%Change-Post: -8 %
FEV1FVC-%Pred-Pre: 65 %
FEV6-%Change-Post: 24 %
FEV6-%Pred-Post: 71 %
FEV6-%Pred-Pre: 56 %
FEV6-Post: 2.66 L
FEV6-Pre: 2.13 L
FEV6FVC-%Change-Post: 2 %
FEV6FVC-%Pred-Post: 99 %
FEV6FVC-%Pred-Pre: 97 %
FVC-%Change-Post: 22 %
FVC-%Pred-Post: 71 %
FVC-%Pred-Pre: 58 %
FVC-Post: 2.84 L
FVC-Pre: 2.32 L
Post FEV1/FVC ratio: 44 %
Post FEV6/FVC ratio: 93 %
Pre FEV1/FVC ratio: 48 %
Pre FEV6/FVC Ratio: 91 %

## 2022-10-26 NOTE — Progress Notes (Signed)
Patient came for a UA results sent to provider

## 2022-10-26 NOTE — Progress Notes (Signed)
Spirometry pre and post and Dlco done today. ?

## 2022-10-27 ENCOUNTER — Other Ambulatory Visit: Payer: Self-pay | Admitting: Family Medicine

## 2022-10-31 ENCOUNTER — Ambulatory Visit
Admission: RE | Admit: 2022-10-31 | Discharge: 2022-10-31 | Disposition: A | Discharge status: Home / Self Care | Payer: Medicare Other | Source: Ambulatory Visit | Attending: Adult Health | Admitting: Adult Health

## 2022-10-31 ENCOUNTER — Other Ambulatory Visit: Payer: Self-pay | Admitting: Physician Assistant

## 2022-10-31 DIAGNOSIS — R0609 Other forms of dyspnea: Secondary | ICD-10-CM

## 2022-10-31 DIAGNOSIS — R0600 Dyspnea, unspecified: Secondary | ICD-10-CM | POA: Diagnosis not present

## 2022-10-31 DIAGNOSIS — J439 Emphysema, unspecified: Secondary | ICD-10-CM | POA: Diagnosis not present

## 2022-10-31 DIAGNOSIS — J449 Chronic obstructive pulmonary disease, unspecified: Secondary | ICD-10-CM

## 2022-10-31 DIAGNOSIS — R911 Solitary pulmonary nodule: Secondary | ICD-10-CM | POA: Diagnosis not present

## 2022-11-02 ENCOUNTER — Encounter: Payer: Self-pay | Admitting: Oncology

## 2022-11-02 ENCOUNTER — Encounter: Payer: Self-pay | Admitting: Nurse Practitioner

## 2022-11-02 ENCOUNTER — Telehealth: Payer: Self-pay

## 2022-11-02 ENCOUNTER — Ambulatory Visit: Payer: Medicare Other | Admitting: Nurse Practitioner

## 2022-11-02 ENCOUNTER — Other Ambulatory Visit (HOSPITAL_COMMUNITY): Payer: Self-pay

## 2022-11-02 VITALS — BP 110/60 | HR 82 | Ht 68.0 in | Wt 198.4 lb

## 2022-11-02 DIAGNOSIS — J449 Chronic obstructive pulmonary disease, unspecified: Secondary | ICD-10-CM

## 2022-11-02 DIAGNOSIS — R918 Other nonspecific abnormal finding of lung field: Secondary | ICD-10-CM | POA: Diagnosis not present

## 2022-11-02 DIAGNOSIS — J4489 Other specified chronic obstructive pulmonary disease: Secondary | ICD-10-CM | POA: Diagnosis not present

## 2022-11-02 DIAGNOSIS — J439 Emphysema, unspecified: Secondary | ICD-10-CM | POA: Insufficient documentation

## 2022-11-02 DIAGNOSIS — J9611 Chronic respiratory failure with hypoxia: Secondary | ICD-10-CM | POA: Diagnosis not present

## 2022-11-02 MED ORDER — ROFLUMILAST 500 MCG PO TABS: ORAL_TABLET | ORAL | 11 refills | Status: DC

## 2022-11-02 NOTE — Assessment & Plan Note (Signed)
Lung nodule follow up CT chest 12/19; awaiting final read. Will determine timing of next scan vs referral to lung cancer screening program based on results.

## 2022-11-02 NOTE — Assessment & Plan Note (Signed)
Stable without increased O2 requirement. Goal >88-90%. Continue nocturnal use.

## 2022-11-02 NOTE — Chronic Care Management (AMB) (Signed)
Chronic Care Management Pharmacy Assistant   Name: Don [name]  MRN: [id number] DOB: Apr 18, 1951  Reason for Encounter: Disease State/ Diabetes  Recent office visits:  10-26-2022 [name], CMA. Abnormal UA.  09-29-2022 [name], MD. B12= 221. WBC= 12.9, MCH= 25.7, Neutrophils Absolute= 8.6. Glucose= 197. A1C= 7.5. Trig= 292, HDL= 24, VLDL= 44. Methylmalonic Acid= 452. TSH= 4.950. Vit D= 17.6. STOP breo ellipta.   09-22-2022 [name], LPN. Covid booster vaccine given.  08-30-2022 [name], MD. FINISHED keflex. START levofloxacin 500 mg daily and prednisone 20 mg follow instructions.   Recent consult visits:  11-02-2022 [name], NP (Pulmonology). START Daliresp 500 mcg Take 0.5 tablets (250 mcg total) by mouth daily for 28 days, THEN 1 tablet (500 mcg total) daily.    10-26-2022 Brand Males, MD (Pulmonology). Pulmonary function test.  08-17-2022 [name], [name], NP (Pulmonary). Glucose= 239. WBC= 11.4, Hemo= 12.9, HCT= 38.8, RDW= 16.1. STOP Breo ellipta and spiriva. START brovana 2 mls by nebulization twice daily, pulmicort Take 2 mLs (0.5 mg total) by nebulization in the morning and at bedtime and Yupelri 175 mcg by nebulization daily. Pulmonary function and CT Chest Wo Contrast ordered.Flu vaccine given.    Hospital visits:  None in previous 6 months  Medications: Outpatient Encounter Medications as of 11/02/2022  Medication Sig   ALPRAZolam (XANAX) 0.25 MG tablet TAKE 1 TABLET BY MOUTH ONCE DAILY AS NEEDED FOR ANXIETY   arformoterol (BROVANA) 15 MCG/2ML NEBU Take 2 mLs (15 mcg total) by nebulization 2 (two) times daily.   budesonide (PULMICORT) 0.25 MG/2ML nebulizer solution Take 2 mLs (0.25 mg total) by nebulization in the morning and at bedtime.   clopidogrel (PLAVIX) 75 MG tablet Take 1 tablet by mouth once daily   cyanocobalamin (VITAMIN B12) 1000 MCG/ML injection Inject 1 mL (1,000 mcg total) into the muscle once a week.    cyclobenzaprine (FLEXERIL) 10 MG tablet Take 1 tablet by mouth three times daily as needed for muscle spasm   DULoxetine (CYMBALTA) 60 MG capsule Take 1 capsule by mouth twice daily   fenofibrate 160 MG tablet Take 1 tablet (160 mg total) by mouth daily.   finasteride (PROSCAR) 5 MG tablet Take 1 tablet by mouth once daily   gabapentin (NEURONTIN) 400 MG capsule Take 1 capsule (400 mg total) by mouth 2 (two) times daily.   glucose blood (ONETOUCH ULTRA) test strip 1 each by Other route in the morning and at bedtime. Use as instructed   ipratropium-albuterol (DUONEB) 0.5-2.5 (3) MG/3ML SOLN USE 1 AMPULE IN NEBULIZER 4 TIMES DAILY   Lancets (ONETOUCH DELICA PLUS [id number]) MISC USE 1  TO CHECK GLUCOSE THREE TIMES DAILY   levothyroxine (SYNTHROID) 25 MCG tablet Take 1 tablet (25 mcg total) by mouth daily.   metFORMIN (GLUCOPHAGE) 1000 MG tablet Take 1 tablet (1,000 mg total) by mouth 2 (two) times daily with a meal.   metoprolol tartrate (LOPRESSOR) 50 MG tablet Take 1 tablet by mouth twice daily   MYRBETRIQ 25 MG TB24 tablet Take 1 tablet by mouth once daily   nitroGLYCERIN (NITROSTAT) 0.4 MG SL tablet DISSOLVE ONE TABLET UNDER THE TONGUE EVERY 5 TO 10 MINUTES PRIOR TO ACTIVITIES WHICH MIGHT PRECIPITATE AN ATTACK   nystatin cream (MYCOSTATIN) Apply 1 application topically 2 (two) times daily as needed for dry skin.   Omega-3 Fatty Acids (FISH OIL) 1000 MG CAPS Take 2 capsules (2,000 mg total) by mouth in the morning and at bedtime.  omeprazole (PRILOSEC) 20 MG capsule Take 1 capsule by mouth once daily   oxyCODONE-acetaminophen (PERCOCET/ROXICET) 5-325 MG tablet Take 1 tablet by mouth every 6 (six) hours as needed for severe pain.   predniSONE (DELTASONE) 10 MG tablet 4 tabs for 2 days, then 3 tabs for 2 days, 2 tabs for 2 days, then 1 tab for 2 days, then stop   revefenacin (YUPELRI) 175 MCG/3ML nebulizer solution Take 3 mLs (175 mcg total) by nebulization daily.   roflumilast (DALIRESP) 500 MCG  TABS tablet Take 0.5 tablets (250 mcg total) by mouth daily for 28 days, THEN 1 tablet (500 mcg total) daily.   rosuvastatin (CRESTOR) 40 MG tablet Take 1 tablet by mouth once daily   tamsulosin (FLOMAX) 0.4 MG CAPS capsule TAKE 2 CAPSULES BY MOUTH ONCE DAILY AFTER SUPPER   TOUJEO SOLOSTAR 300 UNIT/ML Solostar Pen INJECT 30 UNITS SUBCUTANEOUSLY ONCE DAILY   traZODone (DESYREL) 150 MG tablet Take 1 tablet by mouth once daily   VENTOLIN HFA 108 (90 Base) MCG/ACT inhaler INHALE 2 PUFFS BY MOUTH EVERY 6 HOURS AS NEEDED FOR SHORTNESS OF BREATH FOR WHEEZING   Vitamin D, Ergocalciferol, (DRISDOL) 1.25 MG (50000 UNIT) CAPS capsule Take 1 capsule (50,000 Units total) by mouth 2 (two) times a week.   No facility-administered encounter medications on file as of 11/02/2022.  Recent Relevant Labs: Lab Results  Component Value Date/Time   HGBA1C 7.5 (H) 09/29/2022 12:39 PM   HGBA1C 7.9 (H) 05/19/2022 08:23 AM   MICROALBUR 30 05/23/2021 09:39 AM   MICROALBUR 80 08/23/2020 02:39 PM    Kidney Function Lab Results  Component Value Date/Time   CREATININE 0.89 09/29/2022 12:39 PM   CREATININE 1.01 08/17/2022 10:15 AM   GFR 74.13 08/17/2022 10:15 AM   GFRNONAA 87 12/16/2020 09:13 AM   GFRAA 100 12/16/2020 09:13 AM    11-02-2022: 1st attempt left VM 11-03-2022: 2nd attempt left VM 11-08-2022: 3rd attempt left VM  Adherence Review: Is the patient currently on a STATIN medication? Yes Is the patient currently on ACE/ARB medication? No Does the patient have >5 day gap between last estimated fill dates? No   Star Rating Drugs: Rosuvastatin 40 mg- Last filled 10-26-2022 90 DS. Previous 07-31-2022 90 DS Metformin 1000 mg- Last filled 10-01-2022 90 DS. Previous 07-03-2022 90 DS  [place] Clinical Pharmacist Assistant [phone]

## 2022-11-02 NOTE — Assessment & Plan Note (Signed)
Severe COPD with chronic bronchitis and prone to exacerbations. He has a high symptom burden at baseline; some improvement with triple therapy nebs. We will start him on daliresp with goal of reducing further exacerbations; medication education provided. Recent LFTs nl. We will repeat in 4-5 weeks. Strongly encouraged him to quit smoking. Work on graded exercises.  Patient Instructions  Continue Budesonide 2 mL neb Twice daily. Brush tongue and rinse mouth afterwards Continue Brovana Neb Twice daily  Continue Yupelri Neb daily  Continue Albuterol inhaler 2 puffs or 3 mL neb every 6 hours as needed for shortness of breath or wheezing. Notify if symptoms persist despite rescue inhaler/neb use.  Work on not smoking  Continue on Oxygen 2l/m At bedtime    Someone will call you next week with your CT scan results   Start daliresp 250 mcg daily for 4 weeks then increase to 500 mcg daily.  Check liver enzymes in 4-5 weeks  Follow up with Dr. Chase Caller (1st) or Tammy [name] in 4-5 weeks to see how daliresp is going Please contact office for sooner follow up if symptoms do not improve or worsen or seek emergency care

## 2022-11-02 NOTE — Patient Instructions (Addendum)
Continue Budesonide 2 mL neb Twice daily. Brush tongue and rinse mouth afterwards Continue Brovana Neb Twice daily  Continue Yupelri Neb daily  Continue Albuterol inhaler 2 puffs or 3 mL neb every 6 hours as needed for shortness of breath or wheezing. Notify if symptoms persist despite rescue inhaler/neb use.  Work on not smoking  Continue on Oxygen 2l/m At bedtime    Someone will call you next week with your CT scan results   Start daliresp 250 mcg daily for 4 weeks then increase to 500 mcg daily.  Check liver enzymes in 4-5 weeks  Follow up with Dr. Chase Caller (1st) or Tammy [name] in 4-5 weeks to see how daliresp is going Please contact office for sooner follow up if symptoms do not improve or worsen or seek emergency care

## 2022-11-02 NOTE — Progress Notes (Signed)
$'@Patient'e$  ID: Don [name], male    DOB: October 16, 1951, 72 y.o.   MRN: [id number]  Chief Complaint  Patient presents with   Follow-up    Get PFT and CT scan results.    Referring provider: [name], MD  HPI: 71 year old male, active smoker followed for severe COPD, lung nodules, nocturnal hypoxemia.  He has a history of squamous cell carcinoma of the larynx.  He is a patient of Dr. Golden Pop and last seen in office 08/17/2022 by [name],NP.  Past medical history significant for cardiomyopathy, CAD, hypertension, PAD, GERD, DM 2, history of stroke, BPH, CKD stage III, anxiety.  TEST/EVENTS:   08/17/2022: OV with [name],NP for follow-up visit.  Last seen November 2020.  Underlying COPD.  Currently on Breo and Spiriva.  Having difficulty using his inhalers.  Does have some memory impairment.  Also feels like he cannot get enough of the medicine out of the inhalers.  Just feels like his breathing is gotten slowly worse over the last couple years.  Gets short of breath with minimal activities.  Lives a relatively sedentary lifestyle.  Does continue to smoke.  Discussed smoking cessation.  He is on 2 L of oxygen at bedtime.  Spirometry in the office showed FEV1 of 31% and ratio 42.  Formal PFTs ordered for further evaluation.  Changed to triple therapy nebulizer regimen to see if we can have better control over his symptoms.  Pulmonary nodules identified on previous CT scan.  Plan for repeat-ordered at visit.  11/02/2022: Today-follow-up Patient presents today for follow-up after undergoing pulmonary function testing.  He has severe obstructive defect with reversibility and severe diffusion defect.  He has been doing a little bit better with triple therapy regimen.  Feels like he is actually getting the medicine and it opens up his chest little bit.  Continues to have daily symptoms of dyspnea, which limit his activities.  He also continues to have daily symptoms of productive cough with white  to brown sputum.  This is baseline for him.  He has had multiple exacerbations over the past year requiring antibiotics and steroids.  His wife wants to know if we can put him on something to help prevent future flareups.  No increased chest congestion or wheezing.  No hemoptysis, weight loss or anorexia.  CT scan results are not available yet.  He does continue to smoke daily; down to 4 cigarettes a day.  He has tried multiple different things in the past to help him quit without any benefit.  Allergies  Allergen Reactions   Penicillins Rash and Hives    Has patient had a PCN reaction causing immediate rash, facial/tongue/throat swelling, SOB or lightheadedness with hypotension:unsure Has patient had a PCN reaction causing severe rash involving mucus membranes or skin necrosis:unsure Has patient had a PCN reaction that required hospitalization:No Has patient had a PCN reaction occurring within the last 10 years:No If all of the above answers are "NO", then may proceed with Cephalosporin use.  rash   Doxycycline Rash   Iodinated Contrast Media Rash    Also developed blisters Also developed blisters   Penicillin G Rash   Tramadol Rash    Immunization History  Administered Date(s) Administered   COVID-19, mRNA, vaccine(Comirnaty)12 years and older 09/22/2022   Fluad Quad(high Dose 65+) 08/16/2020, 09/01/2021, 08/17/2022   H1N1 09/13/2008   Influenza Split 09/01/2021   Influenza, High Dose Seasonal PF 10/13/2017, 08/26/2019, 08/16/2020   Influenza-Unspecified 11/13/2002, 08/19/2004, 09/13/2004, 09/13/2006, 10/17/2007, 08/12/2008, 10/13/2017, 08/26/2019  Moderna SARS-COV2 Booster Vaccination 10/12/2020   Moderna Sars-Covid-2 Vaccination 10/12/2020   Pneumococcal Conjugate-13 03/13/2000, 10/01/2015   Pneumococcal Polysaccharide-23 11/13/2002, 08/13/2009   Pneumococcal-Unspecified 03/13/2000   Td 11/13/1996    Past Medical History:  Diagnosis Date   Absence of bladder continence  01/08/2022   Acute bilateral low back pain without sciatica 01/08/2022   Anxiety state 02/02/2022   Arthritis    Asymptomatic LV dysfunction 10/18/2017   B12 deficiency anemia 08/25/2021   Basal cell carcinoma    Benign prostatic hyperplasia with incomplete bladder emptying 01/14/2019   Blood in ear canal, left 05/13/2021   Bone pain 03/08/2020   Cancer ([place])    throat - 1997, throat - 2018   Cardiomyopathy, secondary ([place]) 12/01/2016   Overview:  EF 47% 12/26/16   Chest pain syndrome 11/02/2017   Chronic coronary artery disease 10/18/2017   Chronic cough 05/16/2022   Chronic ischemic heart disease 11/13/1998   Jan 22, 2003 Entered By: Marland Kitchen J Comment:  massive Mi in 2000 per patient hsitory, 2nd Mi in 2001Mar 11, 2004 Entered By: Marland Kitchen J Comment: Had stent placedin 2000,cath/angio in 2001   Chronic neck pain 05/16/2022   Chronic respiratory failure with hypoxia ([place]) 05/16/2022   CKD (chronic kidney disease) stage 3, GFR 30-59 ml/min (HCC) 07/25/2019   COPD (chronic obstructive pulmonary disease) ([place])    Coronary artery disease    Coronary artery disease involving native coronary artery of native heart with angina pectoris ([place]) 12/01/2016   Overview:  He has hx of IWMI in remote past, last cath in 2012 [facility] showed chronic total occlusion of previously stented RCA, with good collaterals, a 40% LAD stenosis, inferior hypokinesis and EF 45%.    He's been lost to Cardiology f/u since 2015, but has not had any recurrent events   Deficiency anemia 08/25/2021   Depression    Diabetes mellitus ([place]) 03/08/2020   Diabetic polyneuropathy associated with type 2 diabetes mellitus ([place]) 05/16/2022   Diaphoresis 05/16/2022   Diverticulitis 05/02/2020   Dyslipidemia 12/01/2016   Emphysema lung ([place])    Erectile dysfunction due to diseases classified elsewhere 06/12/2019   Essential hypertension 10/18/2017   GERD (gastroesophageal reflux disease)    Hoarseness 11/15/2016   Hyperlipidemia     Hypertension    Insomnia 01/28/2021   Iron deficiency anemia due to chronic blood loss 08/25/2021   Laceration of thumb, left 12/17/2015   Left flank pain 03/14/2022   Left lower quadrant abdominal tenderness without rebound tenderness 03/14/2022   Lesion of vocal cord    Leukoplakia of larynx 11/15/2016   Long-term use of aspirin therapy 10/02/2018   Malfunction of penile prosthesis ([place]) 01/14/2019   Malignant neoplasm of skin 01/28/2021   Formatting of this note might be different from the original. Jan 22, 2003 Entered By: Marland Kitchen J Comment: of skin - removed x2 inpast Jan 22, 2003 Entered By: Marland Kitchen J Comment: of skin - removed x2 inpast   Melanoma of back ([place])    melanoma on back   Memory loss 03/08/2020   Mood disorder in conditions classified elsewhere 02/02/2022   Myalgia 03/08/2020   Myocardial infarction [facility]) 2000   2 stents   Obstructive chronic bronchitis with exacerbation ([place]) 05/16/2022   Oropharyngeal dysphagia 08/12/2018   Other ill-defined and unknown causes of morbidity and mortality 11/13/1993   Formatting of this note might be different from the original. Jan 31, 2008 Entered By: ARMOUR,ROSS B Comment: lumbar, 8/08 Jan 22, 2003 Entered By: Jani Files  Comment: [id number] in work place  quit [id number] in 2000 Jan 22, 2003 Entered By: MAMarland Kitchen Comment: [id number] work place  quit [id number] in 2000 Jan 31, 2008 Entered By: ARMOStephannie Petersomment: lumbar, 8/08   PAD (peripheral artery disease) (HCC)[place]/04/2017   Peripheral vascular disease (HCC)[place] iliac artery clot   Peyronie's disease 06/12/2019   Pharyngoesophageal dysphagia 08/12/2018   Pneumonia 02/2015   Pneumothorax 06/07/2020   Preoperative clearance 04/03/2018   Squamous cell carcinoma of larynx (HCC)[place]/31/2018   Testicular pain, left 01/14/2019   Throat cancer (HCC)[place] Tobacco use disorder 06/12/2019    Tobacco History: Social History   Tobacco Use  Smoking Status Every Day   Packs/day:  1.00   Years: 50.00   Total pack years: 50.00   Types: Cigarettes  Smokeless Tobacco Never  Tobacco Comments   down to .5 ppd  11/02/2022 hfb   Ready to quit: Not Answered Counseling given: Not Answered Tobacco comments: down to .5 ppd  11/02/2022 hfb   Outpatient Medications Prior to Visit  Medication Sig Dispense Refill   ALPRAZolam (XANAX) 0.25 MG tablet TAKE 1 TABLET BY MOUTH ONCE DAILY AS NEEDED FOR ANXIETY 30 tablet 2   arformoterol (BROVANA) 15 MCG/2ML NEBU Take 2 mLs (15 mcg total) by nebulization 2 (two) times daily. 120 mL 6   budesonide (PULMICORT) 0.25 MG/2ML nebulizer solution Take 2 mLs (0.25 mg total) by nebulization in the morning and at bedtime. 60 mL 11   clopidogrel (PLAVIX) 75 MG tablet Take 1 tablet by mouth once daily 90 tablet 0   cyanocobalamin (VITAMIN B12) 1000 MCG/ML injection Inject 1 mL (1,000 mcg total) into the muscle once a week. 10 mL 0   cyclobenzaprine (FLEXERIL) 10 MG tablet Take 1 tablet by mouth three times daily as needed for muscle spasm 90 tablet 0   DULoxetine (CYMBALTA) 60 MG capsule Take 1 capsule by mouth twice daily 180 capsule 1   fenofibrate 160 MG tablet Take 1 tablet (160 mg total) by mouth daily. 90 tablet 0   finasteride (PROSCAR) 5 MG tablet Take 1 tablet by mouth once daily 90 tablet 1   gabapentin (NEURONTIN) 400 MG capsule Take 1 capsule (400 mg total) by mouth 2 (two) times daily. 1 capsule 1   glucose blood (ONETOUCH ULTRA) test strip 1 each by Other route in the morning and at bedtime. Use as instructed 100 each 0   ipratropium-albuterol (DUONEB) 0.5-2.5 (3) MG/3ML SOLN USE 1 AMPULE IN NEBULIZER 4 TIMES DAILY 360 mL 1   Lancets (ONETOUCH DELICA PLUS [id number] USE 1  TO CHECK GLUCOSE THREE TIMES DAILY 100 each 0   levothyroxine (SYNTHROID) 25 MCG tablet Take 1 tablet (25 mcg total) by mouth daily. 60 tablet 0   metFORMIN (GLUCOPHAGE) 1000 MG tablet Take 1 tablet (1,000 mg total) by mouth 2 (two) times daily with a meal. 180  tablet 2   metoprolol tartrate (LOPRESSOR) 50 MG tablet Take 1 tablet by mouth twice daily 180 tablet 0   MYRBETRIQ 25 MG TB24 tablet Take 1 tablet by mouth once daily 30 tablet 0   nitroGLYCERIN (NITROSTAT) 0.4 MG SL tablet DISSOLVE ONE TABLET UNDER THE TONGUE EVERY 5 TO 10 MINUTES PRIOR TO ACTIVITIES WHICH MIGHT PRECIPITATE AN ATTACK 25 tablet 0   nystatin cream (MYCOSTATIN) Apply 1 application topically 2 (two) times daily as needed for dry skin.     Omega-3 Fatty Acids (FISH OIL) 1000 MG  CAPS Take 2 capsules (2,000 mg total) by mouth in the morning and at bedtime. 180 capsule 12   omeprazole (PRILOSEC) 20 MG capsule Take 1 capsule by mouth once daily 90 capsule 0   oxyCODONE-acetaminophen (PERCOCET/ROXICET) 5-325 MG tablet Take 1 tablet by mouth every 6 (six) hours as needed for severe pain. 120 tablet 0   predniSONE (DELTASONE) 10 MG tablet 4 tabs for 2 days, then 3 tabs for 2 days, 2 tabs for 2 days, then 1 tab for 2 days, then stop 20 tablet 0   revefenacin (YUPELRI) 175 MCG/3ML nebulizer solution Take 3 mLs (175 mcg total) by nebulization daily. 90 mL 11   rosuvastatin (CRESTOR) 40 MG tablet Take 1 tablet by mouth once daily 90 tablet 1   tamsulosin (FLOMAX) 0.4 MG CAPS capsule TAKE 2 CAPSULES BY MOUTH ONCE DAILY AFTER SUPPER 180 capsule 1   TOUJEO SOLOSTAR 300 UNIT/ML Solostar Pen INJECT 30 UNITS SUBCUTANEOUSLY ONCE DAILY 6 mL 3   traZODone (DESYREL) 150 MG tablet Take 1 tablet by mouth once daily 90 tablet 0   VENTOLIN HFA 108 (90 Base) MCG/ACT inhaler INHALE 2 PUFFS BY MOUTH EVERY 6 HOURS AS NEEDED FOR SHORTNESS OF BREATH FOR WHEEZING 18 g 0   Vitamin D, Ergocalciferol, (DRISDOL) 1.25 MG (50000 UNIT) CAPS capsule Take 1 capsule (50,000 Units total) by mouth 2 (two) times a week. 10 capsule 2   No facility-administered medications prior to visit.     Review of Systems:   Constitutional: No weight loss or gain, night sweats, fevers, chills, or lassitude. +daily fatigue  (baseline) HEENT: No headaches, difficulty swallowing, tooth/dental problems, or sore throat. No sneezing, itching, ear ache, nasal congestion, or post nasal drip CV:  No chest pain, orthopnea, PND, swelling in lower extremities, anasarca, dizziness, palpitations, syncope Resp: +shortness of breath with exertion; daily productive cough. No hemoptysis. No wheezing.  No chest wall deformity GI:  No heartburn, indigestion, abdominal pain, loss of appetite GU: No dysuria, change in color of urine, urgency or frequency.   Skin: No rash, lesions, ulcerations MSK:  No joint pain or swelling.  Neuro: No dizziness or lightheadedness.  Psych: No depression or anxiety. Mood stable.     Physical Exam:  BP 110/60 (BP Location: Left Arm, Patient Position: Sitting, Cuff Size: Small)   Pulse 82   Ht '5\' 8"'$  (1.727 m)   Wt 198 lb 6.4 oz (90 kg)   SpO2 100%   BMI 30.17 kg/m   GEN: Pleasant, interactive, well-kempt; obese; in no acute distress. HEENT:  Normocephalic and atraumatic. PERRLA. Sclera white. Nasal turbinates pink, moist and patent bilaterally. No rhinorrhea present. Oropharynx pink and moist, without exudate or edema. No lesions, ulcerations, or postnasal drip.  NECK:  Supple w/ fair ROM. No JVD present. Normal carotid impulses w/o bruits. Thyroid symmetrical with no goiter or nodules palpated. No lymphadenopathy.   CV: RRR, no m/r/g, no peripheral edema. Pulses intact, +2 bilaterally. No cyanosis, pallor or clubbing. PULMONARY:  Unlabored, regular breathing. Diminished bilaterally A&P w/o wheezes/rales/rhonchi. No accessory muscle use.  GI: BS present and normoactive. Soft, non-tender to palpation. No organomegaly or masses detected MSK: No erythema, warmth or tenderness. Cap refil <2 sec all extrem. No deformities or joint swelling noted.  Neuro: A/Ox3. No focal deficits noted.   Skin: Warm, no lesions or rashe Psych: Normal affect and behavior. Judgement and thought content appropriate.      Lab Results:  CBC    Component Value Date/Time   WBC  12.9 (H) 09/29/2022 1239   WBC 11.4 (H) 08/17/2022 1015   RBC 5.10 09/29/2022 1239   RBC 4.78 08/17/2022 1015   HGB 13.1 09/29/2022 1239   HCT 40.1 09/29/2022 1239   PLT 221 09/29/2022 1239   MCV 79 09/29/2022 1239   MCV 75 (A) 08/25/2021 0000   MCH 25.7 (L) 09/29/2022 1239   MCH 31.7 01/10/2017 0841   MCHC 32.7 09/29/2022 1239   MCHC 33.2 08/17/2022 1015   RDW 15.2 09/29/2022 1239   LYMPHSABS 3.0 09/29/2022 1239   MONOABS 0.8 08/17/2022 1015   EOSABS 0.2 09/29/2022 1239   BASOSABS 0.1 09/29/2022 1239    BMET    Component Value Date/Time   NA 141 09/29/2022 1239   K 4.3 09/29/2022 1239   CL 99 09/29/2022 1239   CO2 27 09/29/2022 1239   GLUCOSE 197 (H) 09/29/2022 1239   GLUCOSE 239 (H) 08/17/2022 1015   BUN 12 09/29/2022 1239   CREATININE 0.89 09/29/2022 1239   CALCIUM 9.4 09/29/2022 1239   GFRNONAA 87 12/16/2020 0913   GFRAA 100 12/16/2020 0913    BNP No results found for: "BNP"   Imaging:  No results found.       Latest Ref Rng & Units 10/26/2022   10:46 AM  PFT Results  FVC-Pre L 2.32  P  FVC-Predicted Pre % 58  P  FVC-Post L 2.84  P  FVC-Predicted Post % 71  P  Pre FEV1/FVC % % 48  P  Post FEV1/FCV % % 44  P  FEV1-Pre L 1.11  P  FEV1-Predicted Pre % 38  P  FEV1-Post L 1.24  P  DLCO uncorrected ml/min/mmHg 9.16  P  DLCO UNC% % 38  P  DLCO corrected ml/min/mmHg 9.59  P  DLCO COR %Predicted % 40  P  DLVA Predicted % 48  P    P Preliminary result    No results found for: "NITRICOXIDE"      Assessment & Plan:   COPD with chronic bronchitis and emphysema (HCC) Severe COPD with chronic bronchitis and prone to exacerbations. He has a high symptom burden at baseline; some improvement with triple therapy nebs. We will start him on daliresp with goal of reducing further exacerbations; medication education provided. Recent LFTs nl. We will repeat in 4-5 weeks. Strongly encouraged him  to quit smoking. Work on graded exercises.  Patient Instructions  Continue Budesonide 2 mL neb Twice daily. Brush tongue and rinse mouth afterwards Continue Brovana Neb Twice daily  Continue Yupelri Neb daily  Continue Albuterol inhaler 2 puffs or 3 mL neb every 6 hours as needed for shortness of breath or wheezing. Notify if symptoms persist despite rescue inhaler/neb use.  Work on not smoking  Continue on Oxygen 2l/m At bedtime    Someone will call you next week with your CT scan results   Start daliresp 250 mcg daily for 4 weeks then increase to 500 mcg daily.  Check liver enzymes in 4-5 weeks  Follow up with Dr. Chase Caller (1st) or Tammy [name] in 4-5 weeks to see how daliresp is going Please contact office for sooner follow up if symptoms do not improve or worsen or seek emergency care    Chronic respiratory failure with hypoxia ([place]) Stable without increased O2 requirement. Goal >88-90%. Continue nocturnal use.   Lung nodules Lung nodule follow up CT chest 12/19; awaiting final read. Will determine timing of next scan vs referral to lung cancer screening program based on results.  I spent 35 minutes of dedicated to the care of this patient on the date of this encounter to include pre-visit review of records, face-to-face time with the patient discussing conditions above, post visit ordering of testing, clinical documentation with the electronic health record, making appropriate referrals as documented, and communicating necessary findings to members of the patients care team.  [name], NP 11/02/2022  Pt aware and understands NP's role.

## 2022-11-03 ENCOUNTER — Telehealth: Payer: Self-pay

## 2022-11-03 ENCOUNTER — Other Ambulatory Visit (HOSPITAL_COMMUNITY): Payer: Self-pay

## 2022-11-03 NOTE — Telephone Encounter (Signed)
PA request received via CMM through [facility] Medicare for Roflumilast 500MCG tablets  PA has been submitted and is awaiting determination.  Key: Boone [id number]

## 2022-11-07 NOTE — Telephone Encounter (Signed)
Previously handled by Nira Conn, RN

## 2022-11-07 NOTE — Telephone Encounter (Signed)
PA for roflumilast 559mg has been APPROVED Effective from 11/03/2022 through 11/04/2023.

## 2022-11-07 NOTE — Progress Notes (Signed)
Called and spoke with his wife, Anne Ng ([place]), provided results/recommendations per Rexene Edison NP.  Clarified questions she had with Tammy.  She verbalized understanding.  Scheduled f./u with Dr. Chase Caller on 12/12/21 at 1:30 pm, advised to arrive by 1:15 pm for check in.  Nothing further needed.

## 2022-11-09 ENCOUNTER — Observation Stay (HOSPITAL_COMMUNITY)
Admission: EM | Admit: 2022-11-09 | Discharge: 2022-11-10 | Disposition: A | Discharge status: Home / Self Care | Payer: Medicare Other | Attending: Family Medicine | Admitting: Family Medicine

## 2022-11-09 ENCOUNTER — Emergency Department (HOSPITAL_COMMUNITY): Payer: Medicare Other

## 2022-11-09 ENCOUNTER — Telehealth: Payer: Self-pay | Admitting: Internal Medicine

## 2022-11-09 ENCOUNTER — Encounter: Payer: Self-pay | Admitting: Cardiology

## 2022-11-09 ENCOUNTER — Other Ambulatory Visit: Payer: Self-pay

## 2022-11-09 ENCOUNTER — Encounter (HOSPITAL_COMMUNITY): Payer: Self-pay

## 2022-11-09 ENCOUNTER — Telehealth: Payer: Self-pay | Admitting: Cardiology

## 2022-11-09 DIAGNOSIS — R0602 Shortness of breath: Secondary | ICD-10-CM | POA: Diagnosis not present

## 2022-11-09 DIAGNOSIS — E1142 Type 2 diabetes mellitus with diabetic polyneuropathy: Secondary | ICD-10-CM | POA: Diagnosis present

## 2022-11-09 DIAGNOSIS — E1122 Type 2 diabetes mellitus with diabetic chronic kidney disease: Secondary | ICD-10-CM | POA: Diagnosis not present

## 2022-11-09 DIAGNOSIS — G894 Chronic pain syndrome: Secondary | ICD-10-CM | POA: Diagnosis present

## 2022-11-09 DIAGNOSIS — Z79899 Other long term (current) drug therapy: Secondary | ICD-10-CM | POA: Insufficient documentation

## 2022-11-09 DIAGNOSIS — J4489 Other specified chronic obstructive pulmonary disease: Secondary | ICD-10-CM | POA: Diagnosis not present

## 2022-11-09 DIAGNOSIS — Z8521 Personal history of malignant neoplasm of larynx: Secondary | ICD-10-CM | POA: Diagnosis not present

## 2022-11-09 DIAGNOSIS — I2510 Atherosclerotic heart disease of native coronary artery without angina pectoris: Secondary | ICD-10-CM | POA: Diagnosis not present

## 2022-11-09 DIAGNOSIS — I130 Hypertensive heart and chronic kidney disease with heart failure and stage 1 through stage 4 chronic kidney disease, or unspecified chronic kidney disease: Secondary | ICD-10-CM | POA: Insufficient documentation

## 2022-11-09 DIAGNOSIS — F17210 Nicotine dependence, cigarettes, uncomplicated: Secondary | ICD-10-CM | POA: Insufficient documentation

## 2022-11-09 DIAGNOSIS — N183 Chronic kidney disease, stage 3 unspecified: Secondary | ICD-10-CM | POA: Diagnosis not present

## 2022-11-09 DIAGNOSIS — I10 Essential (primary) hypertension: Secondary | ICD-10-CM | POA: Diagnosis present

## 2022-11-09 DIAGNOSIS — I5020 Unspecified systolic (congestive) heart failure: Secondary | ICD-10-CM | POA: Insufficient documentation

## 2022-11-09 DIAGNOSIS — Z1152 Encounter for screening for COVID-19: Secondary | ICD-10-CM | POA: Diagnosis not present

## 2022-11-09 DIAGNOSIS — R0789 Other chest pain: Secondary | ICD-10-CM

## 2022-11-09 DIAGNOSIS — Z85828 Personal history of other malignant neoplasm of skin: Secondary | ICD-10-CM | POA: Diagnosis not present

## 2022-11-09 DIAGNOSIS — I493 Ventricular premature depolarization: Secondary | ICD-10-CM | POA: Diagnosis not present

## 2022-11-09 DIAGNOSIS — R079 Chest pain, unspecified: Secondary | ICD-10-CM | POA: Diagnosis not present

## 2022-11-09 DIAGNOSIS — J439 Emphysema, unspecified: Secondary | ICD-10-CM | POA: Diagnosis present

## 2022-11-09 DIAGNOSIS — Z7902 Long term (current) use of antithrombotics/antiplatelets: Secondary | ICD-10-CM | POA: Insufficient documentation

## 2022-11-09 HISTORY — DX: Other chest pain: R07.89

## 2022-11-09 HISTORY — DX: Ventricular premature depolarization: I49.3

## 2022-11-09 LAB — BASIC METABOLIC PANEL
Anion gap: 9 (ref 5–15)
BUN: 11 mg/dL (ref 8–23)
CO2: 26 mmol/L (ref 22–32)
Calcium: 9.3 mg/dL (ref 8.9–10.3)
Chloride: 103 mmol/L (ref 98–111)
Creatinine, Ser: 0.95 mg/dL (ref 0.61–1.24)
GFR, Estimated: >60 mL/min (ref >60)
Glucose, Bld: 221 mg/dL — ABNORMAL HIGH (ref 70–99)
Potassium: 4 mmol/L (ref 3.5–5.1)
Sodium: 138 mmol/L (ref 135–145)

## 2022-11-09 LAB — CBC
HCT: 41.3 % (ref 39.0–52.0)
Hemoglobin: 13.6 g/dL (ref 13.0–17.0)
MCH: 26.7 pg (ref 26.0–34.0)
MCHC: 32.9 g/dL (ref 30.0–36.0)
MCV: 81.1 fL (ref 80.0–100.0)
Platelets: 270 10*3/uL (ref 150–400)
RBC: 5.09 MIL/uL (ref 4.22–5.81)
RDW: 15.8 % — ABNORMAL HIGH (ref 11.5–15.5)
WBC: 10.9 10*3/uL — ABNORMAL HIGH (ref 4.0–10.5)
nRBC: 0 % (ref 0.0–0.2)

## 2022-11-09 LAB — CBG MONITORING, ED: Glucose-Capillary: 173 mg/dL — ABNORMAL HIGH (ref 70–99)

## 2022-11-09 LAB — RESP PANEL BY RT-PCR (RSV, FLU A&B, COVID)  RVPGX2
Influenza A by PCR: NEGATIVE
Influenza B by PCR: NEGATIVE
Resp Syncytial Virus by PCR: NEGATIVE
SARS Coronavirus 2 by RT PCR: NEGATIVE

## 2022-11-09 LAB — TROPONIN I (HIGH SENSITIVITY)
Troponin I (High Sensitivity): 10 ng/L (ref <18)
Troponin I (High Sensitivity): 10 ng/L (ref <18)
Troponin I (High Sensitivity): 12 ng/L (ref <18)

## 2022-11-09 LAB — T4, FREE: Free T4: 0.65 ng/dL (ref 0.61–1.12)

## 2022-11-09 LAB — D-DIMER, QUANTITATIVE: D-Dimer, Quant: 0.63 ug/mL-FEU — ABNORMAL HIGH (ref 0.00–0.50)

## 2022-11-09 LAB — BRAIN NATRIURETIC PEPTIDE: B Natriuretic Peptide: 216.5 pg/mL — ABNORMAL HIGH (ref 0.0–100.0)

## 2022-11-09 LAB — MAGNESIUM: Magnesium: 1.6 mg/dL — ABNORMAL LOW (ref 1.7–2.4)

## 2022-11-09 LAB — TSH: TSH: 3.43 u[IU]/mL (ref 0.350–4.500)

## 2022-11-09 MED ORDER — ONDANSETRON HCL 4 MG/2ML IJ SOLN: 4.0000 mg | Freq: Four times a day (QID) | INTRAMUSCULAR | Status: DC | PRN

## 2022-11-09 MED ORDER — OXYCODONE-ACETAMINOPHEN 5-325 MG PO TABS: 1.0000 | ORAL_TABLET | Freq: Four times a day (QID) | ORAL | Status: DC | PRN

## 2022-11-09 MED ORDER — MAGNESIUM SULFATE 2 GM/50ML IV SOLN
2.0000 g | Freq: Once | INTRAVENOUS | Status: AC
  Administered 2022-11-09: 2 g via INTRAVENOUS
  Filled 2022-11-09: qty 50

## 2022-11-09 MED ORDER — ENOXAPARIN SODIUM 40 MG/0.4ML IJ SOSY
40.0000 mg | PREFILLED_SYRINGE | INTRAMUSCULAR | Status: DC
  Administered 2022-11-10: 40 mg via SUBCUTANEOUS
  Filled 2022-11-09: qty 0.4

## 2022-11-09 MED ORDER — ALBUTEROL SULFATE (2.5 MG/3ML) 0.083% IN NEBU: 3.0000 mL | INHALATION_SOLUTION | Freq: Four times a day (QID) | RESPIRATORY_TRACT | Status: DC | PRN

## 2022-11-09 MED ORDER — PANTOPRAZOLE SODIUM 40 MG PO TBEC
40.0000 mg | DELAYED_RELEASE_TABLET | Freq: Every day | ORAL | Status: DC
  Administered 2022-11-10: 40 mg via ORAL
  Filled 2022-11-09: qty 1

## 2022-11-09 MED ORDER — ALBUTEROL SULFATE (2.5 MG/3ML) 0.083% IN NEBU
2.5000 mg | INHALATION_SOLUTION | Freq: Once | RESPIRATORY_TRACT | Status: AC
  Administered 2022-11-09: 2.5 mg via RESPIRATORY_TRACT
  Filled 2022-11-09: qty 3

## 2022-11-09 MED ORDER — INSULIN ASPART 100 UNIT/ML IJ SOLN
0.0000 [IU] | INTRAMUSCULAR | Status: DC
  Administered 2022-11-09: 3 [IU] via SUBCUTANEOUS
  Administered 2022-11-10: 11 [IU] via SUBCUTANEOUS
  Administered 2022-11-10: 5 [IU] via SUBCUTANEOUS

## 2022-11-09 MED ORDER — ACETAMINOPHEN 325 MG PO TABS
650.0000 mg | ORAL_TABLET | ORAL | Status: DC | PRN
  Administered 2022-11-10: 650 mg via ORAL
  Filled 2022-11-09: qty 2

## 2022-11-09 MED ORDER — TRAZODONE HCL 50 MG PO TABS
150.0000 mg | ORAL_TABLET | Freq: Every day | ORAL | Status: DC
  Administered 2022-11-09: 150 mg via ORAL
  Filled 2022-11-09: qty 3

## 2022-11-09 MED ORDER — ROFLUMILAST 500 MCG PO TABS
250.0000 ug | ORAL_TABLET | Freq: Every day | ORAL | Status: DC
  Administered 2022-11-10: 250 ug via ORAL
  Filled 2022-11-09 (×2): qty 1

## 2022-11-09 MED ORDER — TAMSULOSIN HCL 0.4 MG PO CAPS
0.8000 mg | ORAL_CAPSULE | Freq: Every day | ORAL | Status: DC
  Administered 2022-11-10: 0.8 mg via ORAL
  Filled 2022-11-09: qty 2

## 2022-11-09 MED ORDER — ALPRAZOLAM 0.25 MG PO TABS
0.2500 mg | ORAL_TABLET | Freq: Every evening | ORAL | Status: DC | PRN
  Administered 2022-11-09 – 2022-11-10 (×2): 0.25 mg via ORAL
  Filled 2022-11-09 (×2): qty 1

## 2022-11-09 MED ORDER — REVEFENACIN 175 MCG/3ML IN SOLN
175.0000 ug | Freq: Every day | RESPIRATORY_TRACT | Status: DC
  Administered 2022-11-10: 175 ug via RESPIRATORY_TRACT
  Filled 2022-11-09 (×2): qty 3

## 2022-11-09 MED ORDER — MIRABEGRON ER 25 MG PO TB24
25.0000 mg | ORAL_TABLET | Freq: Every day | ORAL | Status: DC
  Administered 2022-11-10: 25 mg via ORAL
  Filled 2022-11-09 (×3): qty 1

## 2022-11-09 MED ORDER — DULOXETINE HCL 60 MG PO CPEP
60.0000 mg | ORAL_CAPSULE | Freq: Two times a day (BID) | ORAL | Status: DC
  Administered 2022-11-10 (×2): 60 mg via ORAL
  Filled 2022-11-09 (×2): qty 1

## 2022-11-09 MED ORDER — ROSUVASTATIN CALCIUM 5 MG PO TABS
10.0000 mg | ORAL_TABLET | Freq: Every day | ORAL | Status: DC
  Administered 2022-11-10: 10 mg via ORAL
  Filled 2022-11-09: qty 2

## 2022-11-09 MED ORDER — GABAPENTIN 400 MG PO CAPS
400.0000 mg | ORAL_CAPSULE | Freq: Two times a day (BID) | ORAL | Status: DC
  Administered 2022-11-10 (×2): 400 mg via ORAL
  Filled 2022-11-09 (×2): qty 1

## 2022-11-09 MED ORDER — CYCLOBENZAPRINE HCL 10 MG PO TABS: 10.0000 mg | ORAL_TABLET | Freq: Three times a day (TID) | ORAL | Status: DC | PRN

## 2022-11-09 MED ORDER — CLOPIDOGREL BISULFATE 75 MG PO TABS
75.0000 mg | ORAL_TABLET | Freq: Every day | ORAL | Status: DC
  Administered 2022-11-10: 75 mg via ORAL
  Filled 2022-11-09: qty 1

## 2022-11-09 MED ORDER — METHYLPREDNISOLONE SODIUM SUCC 125 MG IJ SOLR
125.0000 mg | Freq: Once | INTRAMUSCULAR | Status: AC
  Administered 2022-11-09: 125 mg via INTRAVENOUS
  Filled 2022-11-09: qty 2

## 2022-11-09 MED ORDER — ARFORMOTEROL TARTRATE 15 MCG/2ML IN NEBU
15.0000 ug | INHALATION_SOLUTION | Freq: Two times a day (BID) | RESPIRATORY_TRACT | Status: DC
  Administered 2022-11-10 (×2): 15 ug via RESPIRATORY_TRACT
  Filled 2022-11-09 (×2): qty 2

## 2022-11-09 MED ORDER — BUDESONIDE 0.25 MG/2ML IN SUSP
0.2500 mg | Freq: Two times a day (BID) | RESPIRATORY_TRACT | Status: DC
  Administered 2022-11-10 (×2): 0.25 mg via RESPIRATORY_TRACT
  Filled 2022-11-09 (×2): qty 2

## 2022-11-09 MED ORDER — FINASTERIDE 5 MG PO TABS
5.0000 mg | ORAL_TABLET | Freq: Every day | ORAL | Status: DC
  Administered 2022-11-10: 5 mg via ORAL
  Filled 2022-11-09: qty 1

## 2022-11-09 MED ORDER — IPRATROPIUM-ALBUTEROL 0.5-2.5 (3) MG/3ML IN SOLN
3.0000 mL | Freq: Two times a day (BID) | RESPIRATORY_TRACT | Status: DC
  Administered 2022-11-10: 3 mL via RESPIRATORY_TRACT
  Filled 2022-11-09: qty 3

## 2022-11-09 MED ORDER — METOPROLOL TARTRATE 25 MG PO TABS
50.0000 mg | ORAL_TABLET | Freq: Two times a day (BID) | ORAL | Status: DC
  Administered 2022-11-10 (×2): 50 mg via ORAL
  Filled 2022-11-09 (×2): qty 2

## 2022-11-09 MED ORDER — LEVOTHYROXINE SODIUM 25 MCG PO TABS
25.0000 ug | ORAL_TABLET | Freq: Every day | ORAL | Status: DC
  Administered 2022-11-10: 25 ug via ORAL
  Filled 2022-11-09: qty 1

## 2022-11-09 MED ORDER — IPRATROPIUM-ALBUTEROL 0.5-2.5 (3) MG/3ML IN SOLN: 3.0000 mL | Freq: Four times a day (QID) | RESPIRATORY_TRACT | Status: DC

## 2022-11-09 NOTE — Assessment & Plan Note (Signed)
See CP above.

## 2022-11-09 NOTE — Telephone Encounter (Signed)
Spoke with Anne Ng ( pt's wife per [facility]) who state that she and pt are currently on their way to [facility] ED to be evaluated for chest pain, elevated B/P and increased SOB. RN informed Anne Ng that she would let Dr. Chase Caller know and office would f/u at later time. Nothing further needed at this time.

## 2022-11-09 NOTE — Telephone Encounter (Signed)
Wife stated patient wants provider switch to Dr. Bettina Gavia.

## 2022-11-09 NOTE — Assessment & Plan Note (Signed)
Cont home metoprolol Add PRN if needed

## 2022-11-09 NOTE — ED Provider Triage Note (Signed)
Emergency Medicine Provider Triage Evaluation Note  AARAV BURGETT , a 71 y.o. male  was evaluated in triage.  Pt complains of intermittent chest pain and SOB for the last day. Caretaker states heart rate has been "everywhere." Denies any trauma.  Review of Systems  Positive: Chest pain, SOB Negative: Abdominal pain, N/V  Physical Exam  BP (!) 159/80 (BP Location: Right Arm)   Pulse (!) 40   Temp 98.4 F (36.9 C) (Oral)   Resp 20   Ht '5\' 8"'$  (1.727 m)   Wt 90 kg   SpO2 100%   BMI 30.17 kg/m  Gen:   Awake, no distress   Resp:  Normal effort  MSK:   Moves extremities without difficulty  Other:  +irregular irregular pulse  Medical Decision Making  Medically screening exam initiated at 5:23 PM.  Appropriate orders placed.  [place] was informed that the remainder of the evaluation will be completed by another provider, this initial triage assessment does not replace that evaluation, and the importance of remaining in the ED until their evaluation is complete.    Osvaldo Shipper, [place] 11/09/22 1724

## 2022-11-09 NOTE — ED Notes (Signed)
Patient reports frustration on waiting for cardiology to speak with patient. Reassured patient and family that physician will come to see patient and that this RN will reach out to MD about delay.

## 2022-11-09 NOTE — Assessment & Plan Note (Signed)
Severe COPD at baseline Doesn't seem to have exacerbation today Cont home nebs, scheduled and PRN Got 1 dose of solumedrol in ED, but ill hold off on ordering any more Checking COVID and flu as above

## 2022-11-09 NOTE — ED Provider Notes (Cosign Needed)
[facility] Provider Note   CSN: [id number] Arrival date & time: 11/09/22  1651     History  Chief Complaint  Patient presents with   Chest Pain    Don [name] is a 71 y.o. male with h/o HTN, DM, COPD, CKD, presents to the ER for evaluation of chest pain and SOB worsening for the past two days. Wife gives the majority of the history. She reports that the patient was recently started on a new pulmonary medication a few weeks ago as well as a  cholesterol medication. She reports that with his COPD, he is SOB at baseline. The patient reports that over the past two days it has been worse. No exacerbating or relieving factors. Wife reports that he has been cool, clammy, and diaphoretic off and on for the past few weeks.  Denies any abdominal pain, nausea, vomiting, cough, or fever symptoms.  Denies any leg swelling.  Reports compliance with his home medications.  Daily tobacco user.    Chest Pain Associated symptoms: diaphoresis, fatigue and shortness of breath   Associated symptoms: no fever, no nausea and no vomiting        Home Medications Prior to Admission medications   Medication Sig Start Date End Date Taking? Authorizing Provider  ALPRAZolam (XANAX) 0.25 MG tablet TAKE 1 TABLET BY MOUTH ONCE DAILY AS NEEDED FOR ANXIETY 10/31/22   Cox, Kirsten, MD  arformoterol (BROVANA) 15 MCG/2ML NEBU Take 2 mLs (15 mcg total) by nebulization 2 (two) times daily. 09/21/22   [name], Tammy S, NP  budesonide (PULMICORT) 0.25 MG/2ML nebulizer solution Take 2 mLs (0.25 mg total) by nebulization in the morning and at bedtime. 09/21/22   [name], [name], NP  clopidogrel (PLAVIX) 75 MG tablet Take 1 tablet by mouth once daily 09/25/22   Cox, Kirsten, MD  cyanocobalamin (VITAMIN B12) 1000 MCG/ML injection Inject 1 mL (1,000 mcg total) into the muscle once a week. 10/09/22   CoxElnita Maxwell, MD  cyclobenzaprine (FLEXERIL) 10 MG tablet Take 1 tablet by mouth  three times daily as needed for muscle spasm 10/23/22   Cox, Elnita Maxwell, MD  DULoxetine (CYMBALTA) 60 MG capsule Take 1 capsule by mouth twice daily 07/31/22   Cox, Elnita Maxwell, MD  fenofibrate 160 MG tablet Take 1 tablet (160 mg total) by mouth daily. 10/09/22   [name], MD  finasteride (PROSCAR) 5 MG tablet Take 1 tablet by mouth once daily 10/28/22   Cox, Kirsten, MD  gabapentin (NEURONTIN) 400 MG capsule Take 1 capsule (400 mg total) by mouth 2 (two) times daily. 09/29/22   Cox, Elnita Maxwell, MD  glucose blood (ONETOUCH ULTRA) test strip 1 each by Other route in the morning and at bedtime. Use as instructed 10/03/22   Cox, Elnita Maxwell, MD  ipratropium-albuterol (DUONEB) 0.5-2.5 (3) MG/3ML SOLN USE 1 AMPULE IN NEBULIZER 4 TIMES DAILY 05/02/22   Cox, Kirsten, MD  Lancets [facility] DELICA PLUS [id number]) MISC USE 1  TO CHECK GLUCOSE THREE TIMES DAILY 03/02/22   Cox, Elnita Maxwell, MD  levothyroxine (SYNTHROID) 25 MCG tablet Take 1 tablet (25 mcg total) by mouth daily. 10/09/22   [name], MD  metFORMIN (GLUCOPHAGE) 1000 MG tablet Take 1 tablet (1,000 mg total) by mouth 2 (two) times daily with a meal. 07/03/22   Cox, Kirsten, MD  metoprolol tartrate (LOPRESSOR) 50 MG tablet Take 1 tablet by mouth twice daily 09/04/22   Cox, Kirsten, MD  MYRBETRIQ 25 MG TB24 tablet Take 1 tablet by mouth once  daily 05/22/22   Cox, Elnita Maxwell, MD  nitroGLYCERIN (NITROSTAT) 0.4 MG SL tablet DISSOLVE ONE TABLET UNDER THE TONGUE EVERY 5 TO 10 MINUTES PRIOR TO ACTIVITIES WHICH MIGHT PRECIPITATE AN ATTACK 05/29/22   Marge Duncans, PA-C  nystatin cream (MYCOSTATIN) Apply 1 application topically 2 (two) times daily as needed for dry skin. 01/06/20   [provider]  Omega-3 Fatty Acids (FISH OIL) 1000 MG CAPS Take 2 capsules (2,000 mg total) by mouth in the morning and at bedtime. 12/20/20   Richardo Priest, MD  omeprazole (PRILOSEC) 20 MG capsule Take 1 capsule by mouth once daily 01/23/22   Cox, Elnita Maxwell, MD  oxyCODONE-acetaminophen  (PERCOCET/ROXICET) 5-325 MG tablet Take 1 tablet by mouth every 6 (six) hours as needed for severe pain. 10/09/22   Cox, Elnita Maxwell, MD  revefenacin (YUPELRI) 175 MCG/3ML nebulizer solution Take 3 mLs (175 mcg total) by nebulization daily. 09/21/22   [name], [name], NP  roflumilast (DALIRESP) 500 MCG TABS tablet Take 0.5 tablets (250 mcg total) by mouth daily for 28 days, THEN 1 tablet (500 mcg total) daily. 11/02/22 11/30/23  Cobb, Karie Schwalbe, NP  rosuvastatin (CRESTOR) 40 MG tablet Take 1 tablet by mouth once daily 07/31/22   Cox, Kirsten, MD  tamsulosin (FLOMAX) 0.4 MG CAPS capsule TAKE 2 CAPSULES BY MOUTH ONCE DAILY AFTER SUPPER 05/27/22   Cox, Elnita Maxwell, MD  TOUJEO SOLOSTAR 300 UNIT/ML Solostar Pen INJECT 30 UNITS SUBCUTANEOUSLY ONCE DAILY 01/18/22   Cox, Elnita Maxwell, MD  traZODone (DESYREL) 150 MG tablet Take 1 tablet by mouth once daily 10/23/22   Cox, Kirsten, MD  VENTOLIN HFA 108 (90 Base) MCG/ACT inhaler INHALE 2 PUFFS BY MOUTH EVERY 6 HOURS AS NEEDED FOR SHORTNESS OF BREATH FOR WHEEZING 09/14/22   Cox, Kirsten, MD  Vitamin D, Ergocalciferol, (DRISDOL) 1.25 MG (50000 UNIT) CAPS capsule Take 1 capsule (50,000 Units total) by mouth 2 (two) times a week. 10/09/22   CoxElnita Maxwell, MD      Allergies    Penicillins, Doxycycline, Iodinated contrast media, Penicillin g, and Tramadol    Review of Systems   Review of Systems  Constitutional:  Positive for diaphoresis and fatigue. Negative for chills and fever.  Respiratory:  Positive for shortness of breath.   Cardiovascular:  Positive for chest pain. Negative for leg swelling.  Gastrointestinal:  Negative for anal bleeding, diarrhea, nausea and vomiting.    Physical Exam Updated Vital Signs BP (!) 169/107   Pulse 74   Temp 98.2 F (36.8 C) (Oral)   Resp 17   Ht '5\' 8"'$  (1.727 m)   Wt 90 kg   SpO2 96%   BMI 30.17 kg/m  Physical Exam Vitals and nursing note reviewed.  Constitutional:      Appearance: He is diaphoretic.     Comments: Patient is  cool to touch and diaphoretic however is alert and answering all questions.  Cardiovascular:     Pulses:          Radial pulses are 2+ on the right side and 2+ on the left side.       Dorsalis pedis pulses are 2+ on the right side and 2+ on the left side.       Posterior tibial pulses are 2+ on the right side and 2+ on the left side.     Comments: Irregularly irregular rhythm, on rhythm, there is multiple PVCs and PACs with underlying NSR Pulmonary:     Effort: Pulmonary effort is normal.     Comments: Mild  expiratory wheezing.  No accessory muscle use, nasal flaring, tripoding, or cyanosis present.  He speaking in full sentence with the setting on room air without increased work of breathing. Abdominal:     Palpations: Abdomen is soft.     Tenderness: There is no abdominal tenderness. There is no guarding or rebound.  Musculoskeletal:     Right lower leg: No tenderness. No edema.     Left lower leg: No tenderness. No edema.  Skin:    Comments: Cool to touch  Neurological:     General: No focal deficit present.     Mental Status: He is alert.     ED Results / Procedures / Treatments   Labs (all labs ordered are listed, but only abnormal results are displayed) Labs Reviewed  BASIC METABOLIC PANEL - Abnormal; Notable for the following components:      Result Value   Glucose, Bld 221 (*)    All other components within normal limits  CBC - Abnormal; Notable for the following components:   WBC 10.9 (*)    RDW 15.8 (*)    All other components within normal limits  MAGNESIUM - Abnormal; Notable for the following components:   Magnesium 1.6 (*)    All other components within normal limits  BRAIN NATRIURETIC PEPTIDE - Abnormal; Notable for the following components:   B Natriuretic Peptide 216.5 (*)    All other components within normal limits  D-DIMER, QUANTITATIVE - Abnormal; Notable for the following components:   D-Dimer, Quant 0.63 (*)    All other components within normal  limits  CBG MONITORING, ED - Abnormal; Notable for the following components:   Glucose-Capillary 173 (*)    All other components within normal limits  RESP PANEL BY RT-PCR (RSV, FLU A&B, COVID)  [id number]  TSH  T4, FREE  TROPONIN I (HIGH SENSITIVITY)  TROPONIN I (HIGH SENSITIVITY)  TROPONIN I (HIGH SENSITIVITY)    EKG EKG Interpretation  Date/Time:  Thursday November 09 2022 17:14:53 EST Ventricular Rate:  83 PR Interval:  168 QRS Duration: 152 QT Interval:  440 QTC Calculation: 517 R Axis:   254 Text Interpretation: Sinus rhythm with frequent Premature ventricular complexes Right bundle branch block Abnormal ECG When compared with ECG of 13-Jan-2015 10:06, PREVIOUS ECG IS PRESENT Confirmed by Cindee Lame [phone]) on 11/09/2022 5:19:23 PM  Radiology DG Chest Portable 1 View  Result Date: 11/09/2022 CLINICAL DATA:  Right-sided chest pain and shortness of breath x2 days EXAM: PORTABLE CHEST 1 VIEW COMPARISON:  Chest radiograph August 17, 2022 and chest CT October 21, 2022 FINDINGS: Cardiac leads and defibrillator pad project over the chest. The heart size and mediastinal contours are within normal limits. Similar sequela of prior granulomatous disease. No focal airspace consolidation. No visible pleural effusion or pneumothorax. No acute osseous abnormality. Partially visualized anterior cervical fusion hardware. Thoracic spondylosis. IMPRESSION: No acute cardiopulmonary disease. Electronically Signed   By: Dahlia Bailiff M.D.   On: 11/09/2022 18:37    Procedures Procedures   Medications Ordered in ED Medications  ALPRAZolam Duanne Moron) tablet 0.25 mg (0.25 mg Oral Given 11/09/22 2148)  traZODone (DESYREL) tablet 150 mg (150 mg Oral Given 11/09/22 2148)  insulin aspart (novoLOG) injection 0-15 Units (3 Units Subcutaneous Given 11/09/22 2148)  magnesium sulfate IVPB 2 g 50 mL (2 g Intravenous New Bag/Given 11/09/22 2152)  arformoterol (BROVANA) nebulizer solution 15 mcg (has no  administration in time range)  budesonide (PULMICORT) nebulizer solution 0.25 mg (has no administration in time range)  revefenacin (YUPELRI) nebulizer solution 175 mcg (has no administration in time range)  albuterol (VENTOLIN HFA) 108 (90 Base) MCG/ACT inhaler 2 puff (has no administration in time range)  metoprolol tartrate (LOPRESSOR) tablet 50 mg (has no administration in time range)  cyclobenzaprine (FLEXERIL) tablet 10 mg (has no administration in time range)  rosuvastatin (CRESTOR) tablet 10 mg (has no administration in time range)  levothyroxine (SYNTHROID) tablet 25 mcg (has no administration in time range)  oxyCODONE-acetaminophen (PERCOCET/ROXICET) 5-325 MG per tablet 1 tablet (has no administration in time range)  pantoprazole (PROTONIX) EC tablet 40 mg (has no administration in time range)  clopidogrel (PLAVIX) tablet 75 mg (has no administration in time range)  tamsulosin (FLOMAX) capsule 0.8 mg (has no administration in time range)  ipratropium-albuterol (DUONEB) 0.5-2.5 (3) MG/3ML nebulizer solution 3 mL (has no administration in time range)  finasteride (PROSCAR) tablet 5 mg (has no administration in time range)  gabapentin (NEURONTIN) capsule 400 mg (has no administration in time range)  DULoxetine (CYMBALTA) DR capsule 60 mg (has no administration in time range)  roflumilast (DALIRESP) tablet 250 mcg (has no administration in time range)  mirabegron ER (MYRBETRIQ) tablet 25 mg (has no administration in time range)  acetaminophen (TYLENOL) tablet 650 mg (has no administration in time range)  ondansetron (ZOFRAN) injection 4 mg (has no administration in time range)  enoxaparin (LOVENOX) injection 40 mg (has no administration in time range)  albuterol (PROVENTIL) (2.5 MG/3ML) 0.083% nebulizer solution 2.5 mg (2.5 mg Nebulization Given 11/09/22 2037)  methylPREDNISolone sodium succinate (SOLU-MEDROL) 125 mg/2 mL injection 125 mg (125 mg Intravenous Given 11/09/22 2053)    ED  Course/ Medical Decision Making/ A&P                           Medical Decision Making Amount and/or Complexity of Data Reviewed Labs: ordered. Radiology: ordered.  Risk Prescription drug management. Decision regarding hospitalization.   71 y/o M presents to the ER for evaluation of chest pain, shortness of breath, and diaphoresis.  Differential diagnosis includes was not limited to ACS, arrhythmia, pneumonia, pneumothorax, PE, aneurysm, dissection, CHF.  Vital signs show elevated blood pressure 145/68, afebrile, pulse rate, satting well on room air without increased work of breathing.  Physical exam as noted above.  Lab and imaging workup initiated.  I intimately reviewed and interpreted the patient's labs.  Patient is negative for COVID, flu, RSV.  Magnesium slightly decreased at 1.6.  CBC shows slight leukocytosis at 10.9 otherwise no anemia.  Normal platelets.  BMP shows elevated glucose otherwise no electrolyte abnormalities.  BNP is elevated to 16.5.  TSH and free T4 pending.  Initial troponin 10 with repeat at 12.  After discussion with my attending, concern for dissection versus PE versus underlying COPD etiology versus cardiac etiology.  Given the concern for dissection and/or PE, did order D-dimer.  It is elevated however with age adjustment is within normal limits.  Given this value, I do not see any need for any CT imaging given the patient does have action of blisters to contrast media.  Chest x-ray shows no acute cardiopulmonary process.  I spoke with Dr. Acie Fredrickson with cardiology and expressed my concern for his symptoms with the amount of ectopy he was having.  The attending who for the EKG was concern for worsening right bundle branch block which is why the patient was brought back from triage.  He did not seem concerned given his EKG. He does  not see the need for an emergent cardiology evaluation.   My attending and I are still concerned given the patient's presentation as  well as his frequent ectopy.  I think this patient benefit from an inpatient evaluation with cardiology follow-up inpatient for reevaluation and reassessment.  Assessed with patient and wife at bedside who are amenable to admission. Admit to triad.   I discussed this case with my attending physician who cosigned this note including patient's presenting symptoms, physical exam, and planned diagnostics and interventions. Attending physician stated agreement with plan or made changes to plan which were implemented.   Attending physician assessed patient at bedside.  Final Clinical Impression(s) / ED Diagnoses Final diagnoses:  Ventricular ectopy  Shortness of breath  Chest pain, unspecified type    Rx / DC Orders ED Discharge Orders     None         Sherrell Puller, [place] 11/10/22 0601

## 2022-11-09 NOTE — Assessment & Plan Note (Signed)
Cont home pain meds

## 2022-11-09 NOTE — ED Triage Notes (Signed)
Pt c/o right sided chest pain and SOBx2d. Pt was hypertensive at home 184/110. Pt's wife gave pt his metoprolol at home. Pt is eupneic. Pt does not appear to be in distress at this time

## 2022-11-09 NOTE — Assessment & Plan Note (Signed)
Hold home toujeo and metformin Mod scale SSI Q4H.

## 2022-11-09 NOTE — Assessment & Plan Note (Addendum)
CP is intermittent which is a bit less suspicious; however, pt has significant ectopy (frequent PVCs, occasional PVC runs of ~3 beats) on monitor which is a bit more worrisome. BNP slightly elevated, no pulm edema. Intermittent CP + negative age adjusted D.Dimer -> acute aortic syndrome is felt less likely Negative age adjusted D.Dimer also makes PE unlikely. HTN urgency? EDP consulted cardiology, they recd medicine admission, didn't feel they needed to see patient tonight per EDP report to me. CP obs pathway Serial trops (2x neg thus far, continue to trend) Tele monitor Call cards back if he has symptomatic ectopy / runs of PVCs again But id really rather not start amiodarone on a guy who has advanced COPD at baseline if I can avoid (risk of pulm toxicity). Check covid and flu (not having ILI really, has some wheezing when he came in, but does seem like most of [place] has Influenza A at the moment). NPO after MN Given unlikely AAS and PE (from presentation and negative age adjusted D.Dimer), dont think risks of CT angio (allergic to IVC dye, question of blistering rash) can be justified  at the moment. Replace magnesium

## 2022-11-09 NOTE — H&P (Signed)
History and Physical    Patient: Don [name]:[id number] DOB: 11/06/1951 DOA: 11/09/2022 DOS: the patient was seen and examined on 11/09/2022 PCP: [name], MD  Patient coming from: Home  Chief Complaint:  Chief Complaint  Patient presents with   Chest Pain   HPI: Don [name] is a 71 y.o. male with medical history significant of COPD gold D, HFrEF from ICM.  HTN, DM2.  Pt presents to ED with c/o CP, SOB, and elevated BP.  Pt with R sided CP and SOB x2 days, BP at home 184/110.  Pt took metoprolol at home.  CP intermittent, improved at the moment.  Pt has chronic cough.  Intermittent diaphoresis as well.  No reported N/V. Review of Systems: As mentioned in the history of present illness. All other systems reviewed and are negative. Past Medical History:  Diagnosis Date   Absence of bladder continence 01/08/2022   Acute bilateral low back pain without sciatica 01/08/2022   Anxiety state 02/02/2022   Arthritis    Asymptomatic LV dysfunction 10/18/2017   B12 deficiency anemia 08/25/2021   Basal cell carcinoma    Benign prostatic hyperplasia with incomplete bladder emptying 01/14/2019   Blood in ear canal, left 05/13/2021   Bone pain 03/08/2020   Cancer ([place])    throat - 1997, throat - 2018   Cardiomyopathy, secondary ([place]) 12/01/2016   Overview:  EF 47% 12/26/16   Chest pain syndrome 11/02/2017   Chronic coronary artery disease 10/18/2017   Chronic cough 05/16/2022   Chronic ischemic heart disease 11/13/1998   Jan 22, 2003 Entered By: Marland Kitchen J Comment:  massive Mi in 2000 per patient hsitory, 2nd Mi in 2001Mar 11, 2004 Entered By: Marland Kitchen J Comment: Had stent placedin 2000,cath/angio in 2001   Chronic neck pain 05/16/2022   Chronic respiratory failure with hypoxia ([place]) 05/16/2022   CKD (chronic kidney disease) stage 3, GFR 30-59 ml/min (HCC) 07/25/2019   COPD (chronic obstructive pulmonary disease) ([place])    Coronary artery disease    Coronary artery  disease involving native coronary artery of native heart with angina pectoris ([place]) 12/01/2016   Overview:  He has hx of IWMI in remote past, last cath in 2012 at [facility] showed chronic total occlusion of previously stented RCA, with good collaterals, a 40% LAD stenosis, inferior hypokinesis and EF 45%.    He's been lost to Cardiology f/u since 2015, but has not had any recurrent events   Deficiency anemia 08/25/2021   Depression    Diabetes mellitus ([place]) 03/08/2020   Diabetic polyneuropathy associated with type 2 diabetes mellitus ([place]) 05/16/2022   Diaphoresis 05/16/2022   Diverticulitis 05/02/2020   Dyslipidemia 12/01/2016   Emphysema lung ([place])    Erectile dysfunction due to diseases classified elsewhere 06/12/2019   Essential hypertension 10/18/2017   GERD (gastroesophageal reflux disease)    Hoarseness 11/15/2016   Hyperlipidemia    Hypertension    Insomnia 01/28/2021   Iron deficiency anemia due to chronic blood loss 08/25/2021   Laceration of thumb, left 12/17/2015   Left flank pain 03/14/2022   Left lower quadrant abdominal tenderness without rebound tenderness 03/14/2022   Lesion of vocal cord    Leukoplakia of larynx 11/15/2016   Long-term use of aspirin therapy 10/02/2018   Malfunction of penile prosthesis ([place]) 01/14/2019   Malignant neoplasm of skin 01/28/2021   Formatting of this note might be different from the original. Jan 22, 2003 Entered By: Marland Kitchen J Comment: of skin - removed x2 inpast Jan 22, 2003 Entered By: Marland Kitchen J Comment: of skin - removed x2 inpast   Melanoma of back ([place])    melanoma on back   Memory loss 03/08/2020   Mood disorder in conditions classified elsewhere 02/02/2022   Myalgia 03/08/2020   Myocardial infarction [facility]) 2000   2 stents   Obstructive chronic bronchitis with exacerbation ([place]) 05/16/2022   Oropharyngeal dysphagia 08/12/2018   Other ill-defined and unknown causes of morbidity and mortality 11/13/1993   Formatting of this note might be  different from the original. Jan 31, 2008 Entered By: ARMOUR,ROSS B Comment: lumbar, 8/08 Jan 22, 2003 Entered By: Marland Kitchen J Comment: x30yr in work place  quit 324yrago in 2000 Jan 22, 2003 Entered By: MAMarland Kitchen Comment: [id number] work place  quit [id number] in 2000 Jan 31, 2008 Entered By: ARMOStephannie Petersomment: lumbar, 8/08   PAD (peripheral artery disease) (HCC)[place]/04/2017   Peripheral vascular disease (HCC)[place] iliac artery clot   Peyronie's disease 06/12/2019   Pharyngoesophageal dysphagia 08/12/2018   Pneumonia 02/2015   Pneumothorax 06/07/2020   Preoperative clearance 04/03/2018   Squamous cell carcinoma of larynx (HCC)[place]/31/2018   Testicular pain, left 01/14/2019   Throat cancer (HCC)[place] Tobacco use disorder 06/12/2019   Past Surgical History:  Procedure Laterality Date   ABDOMINAL ANGIOGRAM N/A 01/13/2015   Procedure: ABDOMINAL ANGIOGRAM;  Surgeon: ToddRosetta Posner;  [facility];  Service: Cardiovascular;  Laterality: N/A;   APPENDECTOMY     BACK SURGERY     CARDIAC CATHETERIZATION  2000/2012   with stents in [place] bilateral   ESOPHAGOGASTRODUODENOSCOPY     KNEE SURGERY Left    MICROLARYNGOSCOPY WITH CO2 LASER AND EXCISION OF VOCAL CORD LESION N/A 11/23/2016   Procedure: MICROLARYNGOSCOPY  AND EXCISION OF VOCAL CORD LESION;  Surgeon: JohnMelissa Montane;  [facility];  Laterality: N/A;   MICROLARYNGOSCOPY WITH CO2 LASER AND EXCISION OF VOCAL CORD LESION N/A 01/11/2017   Procedure: MICROLARYNGOSCOPY WITH CO2 LASER AND EXCISION OF VOCAL CORD LESION;  Surgeon: JohnMelissa Montane;  [facility] [place];  Laterality: N/A;   MICROLARYNGOSCOPY WITH LASER N/A 03/16/2015   Procedure: MICROLARYNGOSCOPY ;  Surgeon: JohnMelissa Montane;  [facility]  Service: ENT;  Laterality: N/A;   peyronie's surgery     SHOULDER SURGERY Right    rotator cuff   SPINE SURGERY  09/2020   cervical surgery. steel  plate, bone spurs, allograft.   THROAT SURGERY  1997   cancer removed   Social History:  reports that he has been smoking cigarettes. He has a 50.00 pack-year smoking history. He has never used smokeless tobacco. He reports that he does not drink alcohol and does not use drugs.  Allergies  Allergen Reactions   Penicillins Rash and Hives    Has patient had a PCN reaction causing immediate rash, facial/tongue/throat swelling, SOB or lightheadedness with hypotension:unsure Has patient had a PCN reaction causing severe rash involving mucus membranes or skin necrosis:unsure Has patient had a PCN reaction that required hospitalization:No Has patient had a PCN reaction occurring within the last 10 years:No If all of the above answers are "NO", then may proceed with Cephalosporin use.  rash   Doxycycline Rash   Iodinated Contrast Media Rash    Also developed blisters Also developed blisters  Penicillin G Rash   Tramadol Rash    Family History  Problem Relation Age of Onset   Cancer Mother        bone   Hypertension Mother    Stroke Father    Hypertension Father    Healthy Child    Cancer Other        brain   Diabetes Other     Prior to Admission medications   Medication Sig Start Date End Date Taking? Authorizing Provider  ALPRAZolam (XANAX) 0.25 MG tablet TAKE 1 TABLET BY MOUTH ONCE DAILY AS NEEDED FOR ANXIETY 10/31/22   Cox, Kirsten, MD  arformoterol (BROVANA) 15 MCG/2ML NEBU Take 2 mLs (15 mcg total) by nebulization 2 (two) times daily. 09/21/22   [name], Tammy S, NP  budesonide (PULMICORT) 0.25 MG/2ML nebulizer solution Take 2 mLs (0.25 mg total) by nebulization in the morning and at bedtime. 09/21/22   [name], [name], NP  clopidogrel (PLAVIX) 75 MG tablet Take 1 tablet by mouth once daily 09/25/22   Cox, Kirsten, MD  cyanocobalamin (VITAMIN B12) 1000 MCG/ML injection Inject 1 mL (1,000 mcg total) into the muscle once a week. 10/09/22   CoxElnita Maxwell, MD  cyclobenzaprine  (FLEXERIL) 10 MG tablet Take 1 tablet by mouth three times daily as needed for muscle spasm 10/23/22   Cox, Elnita Maxwell, MD  DULoxetine (CYMBALTA) 60 MG capsule Take 1 capsule by mouth twice daily 07/31/22   Cox, Elnita Maxwell, MD  fenofibrate 160 MG tablet Take 1 tablet (160 mg total) by mouth daily. 10/09/22   [name], MD  finasteride (PROSCAR) 5 MG tablet Take 1 tablet by mouth once daily 10/28/22   Cox, Kirsten, MD  gabapentin (NEURONTIN) 400 MG capsule Take 1 capsule (400 mg total) by mouth 2 (two) times daily. 09/29/22   Cox, Elnita Maxwell, MD  glucose blood (ONETOUCH ULTRA) test strip 1 each by Other route in the morning and at bedtime. Use as instructed 10/03/22   Cox, Elnita Maxwell, MD  ipratropium-albuterol (DUONEB) 0.5-2.5 (3) MG/3ML SOLN USE 1 AMPULE IN NEBULIZER 4 TIMES DAILY 05/02/22   Cox, Kirsten, MD  Lancets [facility] DELICA PLUS [id number]) MISC USE 1  TO CHECK GLUCOSE THREE TIMES DAILY 03/02/22   Cox, Elnita Maxwell, MD  levothyroxine (SYNTHROID) 25 MCG tablet Take 1 tablet (25 mcg total) by mouth daily. 10/09/22   [name], MD  metFORMIN (GLUCOPHAGE) 1000 MG tablet Take 1 tablet (1,000 mg total) by mouth 2 (two) times daily with a meal. 07/03/22   Cox, Kirsten, MD  metoprolol tartrate (LOPRESSOR) 50 MG tablet Take 1 tablet by mouth twice daily 09/04/22   Cox, Elnita Maxwell, MD  MYRBETRIQ 25 MG TB24 tablet Take 1 tablet by mouth once daily 05/22/22   Cox, Elnita Maxwell, MD  nitroGLYCERIN (NITROSTAT) 0.4 MG SL tablet DISSOLVE ONE TABLET UNDER THE TONGUE EVERY 5 TO 10 MINUTES PRIOR TO ACTIVITIES WHICH MIGHT PRECIPITATE AN ATTACK 05/29/22   Marge Duncans, PA-C  nystatin cream (MYCOSTATIN) Apply 1 application topically 2 (two) times daily as needed for dry skin. 01/06/20   [provider]  Omega-3 Fatty Acids (FISH OIL) 1000 MG CAPS Take 2 capsules (2,000 mg total) by mouth in the morning and at bedtime. 12/20/20   Richardo Priest, MD  omeprazole (PRILOSEC) 20 MG capsule Take 1 capsule by mouth once daily 01/23/22   Cox,  Elnita Maxwell, MD  oxyCODONE-acetaminophen (PERCOCET/ROXICET) 5-325 MG tablet Take 1 tablet by mouth every 6 (six) hours as needed for severe pain. 10/09/22   [name],  MD  revefenacin (YUPELRI) 175 MCG/3ML nebulizer solution Take 3 mLs (175 mcg total) by nebulization daily. 09/21/22   [name], [name], NP  roflumilast (DALIRESP) 500 MCG TABS tablet Take 0.5 tablets (250 mcg total) by mouth daily for 28 days, THEN 1 tablet (500 mcg total) daily. 11/02/22 11/30/23  Cobb, Karie Schwalbe, NP  rosuvastatin (CRESTOR) 40 MG tablet Take 1 tablet by mouth once daily 07/31/22   Cox, Kirsten, MD  tamsulosin (FLOMAX) 0.4 MG CAPS capsule TAKE 2 CAPSULES BY MOUTH ONCE DAILY AFTER SUPPER 05/27/22   Cox, Elnita Maxwell, MD  TOUJEO SOLOSTAR 300 UNIT/ML Solostar Pen INJECT 30 UNITS SUBCUTANEOUSLY ONCE DAILY 01/18/22   Cox, Elnita Maxwell, MD  traZODone (DESYREL) 150 MG tablet Take 1 tablet by mouth once daily 10/23/22   Cox, Kirsten, MD  VENTOLIN HFA 108 (90 Base) MCG/ACT inhaler INHALE 2 PUFFS BY MOUTH EVERY 6 HOURS AS NEEDED FOR SHORTNESS OF BREATH FOR WHEEZING 09/14/22   Cox, Kirsten, MD  Vitamin D, Ergocalciferol, (DRISDOL) 1.25 MG (50000 UNIT) CAPS capsule Take 1 capsule (50,000 Units total) by mouth 2 (two) times a week. 10/09/22   [name], MD    Physical Exam: Vitals:   11/09/22 2000 11/09/22 2030 11/09/22 2038 11/09/22 2130  BP: (!) 186/106 (!) 185/100  (!) 169/107  Pulse: 76 77  74  Resp: (!) 21 (!) 21  17  Temp:      TempSrc:      SpO2: 96% 98% 97% 96%  Weight:      Height:       Constitutional: NAD, calm, comfortable Eyes: PERRL, lids and conjunctivae normal ENMT: Mucous membranes are moist. Posterior pharynx clear of any exudate or lesions.Normal dentition.  Neck: normal, supple, no masses, no thyromegaly Respiratory: expiratory wheezes, no increased WOB, accessory muscle use, tachypnea. Cardiovascular: IRR, IRR, large amount of ectopy. Abdomen: no tenderness, no masses palpated. No hepatosplenomegaly. Bowel  sounds positive.  Musculoskeletal: no clubbing / cyanosis. No joint deformity upper and lower extremities. Good ROM, no contractures. Normal muscle tone.  Skin: no rashes, lesions, ulcers. No induration Neurologic: CN 2-12 grossly intact. Sensation intact, DTR normal. Strength 5/5 in all 4.  Psychiatric: Normal judgment and insight. Alert and oriented x 3. Normal mood.   Data Reviewed:       Latest Ref Rng & Units 11/09/2022    5:14 PM 09/29/2022   12:39 PM 08/17/2022   10:15 AM  CBC  WBC 4.0 - 10.5 K/uL 10.9  12.9  11.4   Hemoglobin 13.0 - 17.0 g/dL 13.6  13.1  12.9   Hematocrit 39.0 - 52.0 % 41.3  40.1  38.8   Platelets 150 - 400 K/uL 270  221  228.0       Latest Ref Rng & Units 11/09/2022    5:14 PM 09/29/2022   12:39 PM 08/17/2022   10:15 AM  CMP  Glucose 70 - 99 mg/dL 221  197  239   BUN 8 - 23 mg/dL '11  12  12   '$ Creatinine 0.61 - 1.24 mg/dL 0.95  0.89  1.01   Sodium 135 - 145 mmol/L 138  141  137   Potassium 3.5 - 5.1 mmol/L 4.0  4.3  3.9   Chloride 98 - 111 mmol/L 103  99  99   CO2 22 - 32 mmol/L '26  27  31   '$ Calcium 8.9 - 10.3 mg/dL 9.3  9.4  9.4   Total Protein 6.0 - 8.5 g/dL  6.9  Total Bilirubin 0.0 - 1.2 mg/dL  0.3    Alkaline Phos 44 - 121 IU/L  87    AST 0 - 40 IU/L  13    ALT 0 - 44 IU/L  12     Trops 10 and 10  BNP 216.5  Assessment and Plan: * Chest pain, rule out acute myocardial infarction CP is intermittent which is a bit less suspicious; however, pt has significant ectopy (frequent PVCs, occasional PVC runs of ~3 beats) on monitor which is a bit more worrisome. BNP slightly elevated, no pulm edema. Intermittent CP + negative age adjusted D.Dimer -> acute aortic syndrome is felt less likely Negative age adjusted D.Dimer also makes PE unlikely. HTN urgency? EDP consulted cardiology, they recd medicine admission, didn't feel they needed to see patient tonight per EDP report to me. CP obs pathway Serial trops (2x neg thus far, continue to  trend) Tele monitor Call cards back if he has symptomatic ectopy / runs of PVCs again But id really rather not start amiodarone on a guy who has advanced COPD at baseline if I can avoid (risk of pulm toxicity). Check covid and flu (not having ILI really, has some wheezing when he came in, but does seem like most of [place] has Influenza A at the moment). NPO after MN Given unlikely AAS and PE (from presentation and negative age adjusted D.Dimer), dont think risks of CT angio (allergic to IVC dye, question of blistering rash) can be justified  at the moment. Replace magnesium  Ventricular ectopy See CP above.  COPD with chronic bronchitis and emphysema ([place]) Severe COPD at baseline Doesn't seem to have exacerbation today Cont home nebs, scheduled and PRN Got 1 dose of solumedrol in ED, but ill hold off on ordering any more Checking COVID and flu as above  Chronic pain syndrome Cont home pain meds  Diabetic polyneuropathy associated with type 2 diabetes mellitus ([place]) Hold home toujeo and metformin Mod scale SSI Q4H.  Essential hypertension Cont home metoprolol Add PRN if needed      Advance Care Planning:   Code Status: Full Code Confirmed with pt and wife  Consults: EDP reportedly spoke with Dr. Acie Fredrickson  Family Communication: Wife at bedside.  Wife unhappy that cards not coming to see patient tonight, she is calling cards office.  Did tell her that we had consulted cards as above, but had to be cards decision wether they were going to come in and see tonight.  Severity of Illness: The appropriate patient status for this patient is OBSERVATION. Observation status is judged to be reasonable and necessary in order to provide the required intensity of service to ensure the patient's safety. The patient's presenting symptoms, physical exam findings, and initial radiographic and laboratory data in the context of their medical condition is felt to place them at decreased risk for  further clinical deterioration. Furthermore, it is anticipated that the patient will be medically stable for discharge from the hospital within 2 midnights of admission.   Author: Etta Quill., DO 11/09/2022 10:25 PM  For on call review [email or web address].

## 2022-11-09 NOTE — ED Notes (Signed)
Pt placed on zoll.

## 2022-11-09 NOTE — ED Notes (Signed)
X-ray at bedside

## 2022-11-10 ENCOUNTER — Telehealth: Payer: Self-pay

## 2022-11-10 ENCOUNTER — Observation Stay (HOSPITAL_BASED_OUTPATIENT_CLINIC_OR_DEPARTMENT_OTHER): Payer: Medicare Other

## 2022-11-10 DIAGNOSIS — R079 Chest pain, unspecified: Secondary | ICD-10-CM | POA: Diagnosis not present

## 2022-11-10 LAB — BASIC METABOLIC PANEL
Anion gap: 7 (ref 5–15)
BUN: 12 mg/dL (ref 8–23)
CO2: 25 mmol/L (ref 22–32)
Calcium: 8.7 mg/dL — ABNORMAL LOW (ref 8.9–10.3)
Chloride: 104 mmol/L (ref 98–111)
Creatinine, Ser: 0.93 mg/dL (ref 0.61–1.24)
GFR, Estimated: >60 mL/min (ref >60)
Glucose, Bld: 227 mg/dL — ABNORMAL HIGH (ref 70–99)
Potassium: 4.1 mmol/L (ref 3.5–5.1)
Sodium: 136 mmol/L (ref 135–145)

## 2022-11-10 LAB — ECHOCARDIOGRAM COMPLETE
AR max vel: 4.11 cm2
AV Area VTI: 4.07 cm2
AV Area mean vel: 3.98 cm2
AV Mean grad: 1 mmHg
AV Peak grad: 2.6 mmHg
Ao pk vel: 0.8 m/s
Area-P 1/2: 4.6 cm2
Height: 68 in
S' Lateral: 4.9 cm
Weight: 3174.62 oz

## 2022-11-10 LAB — CBG MONITORING, ED
Glucose-Capillary: 317 mg/dL — ABNORMAL HIGH (ref 70–99)
Glucose-Capillary: 267 mg/dL — ABNORMAL HIGH (ref 70–99)
Glucose-Capillary: 235 mg/dL — ABNORMAL HIGH (ref 70–99)
Glucose-Capillary: 226 mg/dL — ABNORMAL HIGH (ref 70–99)

## 2022-11-10 LAB — TROPONIN I (HIGH SENSITIVITY): Troponin I (High Sensitivity): 9 ng/L (ref <18)

## 2022-11-10 LAB — MAGNESIUM: Magnesium: 1.9 mg/dL (ref 1.7–2.4)

## 2022-11-10 MED ORDER — LOSARTAN POTASSIUM 50 MG PO TABS
50.0000 mg | ORAL_TABLET | Freq: Every day | ORAL | Status: DC
  Administered 2022-11-10: 50 mg via ORAL
  Filled 2022-11-10: qty 1

## 2022-11-10 MED ORDER — PERFLUTREN LIPID MICROSPHERE
1.0000 mL | INTRAVENOUS | Status: AC | PRN
  Administered 2022-11-10: 2 mL via INTRAVENOUS

## 2022-11-10 MED ORDER — HYDRALAZINE HCL 20 MG/ML IJ SOLN: 5.0000 mg | Freq: Once | INTRAMUSCULAR | Status: DC

## 2022-11-10 MED ORDER — LOSARTAN POTASSIUM 50 MG PO TABS: 50.0000 mg | ORAL_TABLET | Freq: Every day | ORAL | 1 refills | Status: DC

## 2022-11-10 MED ORDER — METOPROLOL TARTRATE 100 MG PO TABS: 100.0000 mg | ORAL_TABLET | Freq: Every day | ORAL | 1 refills | Status: DC

## 2022-11-10 NOTE — Progress Notes (Signed)
Echocardiogram 2D Echocardiogram has been performed.  Ronny Flurry 11/10/2022, 12:07 PM

## 2022-11-10 NOTE — ED Notes (Signed)
Requested that Secretary order hospital bed for this pt.

## 2022-11-10 NOTE — Inpatient Diabetes Management (Signed)
Inpatient Diabetes Program Recommendations  AACE/ADA: New Consensus Statement on Inpatient Glycemic Control (2015)  Target Ranges:  Prepandial:   less than 140 mg/dL      Peak postprandial:   less than 180 mg/dL (1-2 hours)      Critically ill patients:  140 - 180 mg/dL   Lab Results  Component Value Date   GLUCAP 235 (H) 11/10/2022   HGBA1C 7.5 (H) 09/29/2022    Review of Glycemic Control  Latest Reference Range & Units 11/10/22 02:18 11/10/22 06:06 11/10/22 08:21  Glucose-Capillary 70 - 99 mg/dL 317 (H) 267 (H) 235 (H)  (H): Data is abnormally high  Diabetes history: DM2 Outpatient Diabetes medications: Toujeo 30 units QD, Metformin 1000 mg BID Current orders for Inpatient glycemic control:  Novolog 0-15 units Q4H  Inpatient Diabetes Program Recommendations:    Please consider,  Toujeo 15 units QD (50% of home dose).  Will continue to follow while inpatient.  Thank you, Reche Dixon, MSN, [place] Diabetes Coordinator Inpatient Diabetes Program [phone] (team pager from 8a-5p)

## 2022-11-10 NOTE — ED Notes (Signed)
ED TO INPATIENT HANDOFF REPORT  ED Nurse Name and Phone #: Iona Coach Name/Age/Gender Don [name] 71 y.o. male Room/Bed: 043C/043C  Code Status   Code Status: Full Code  Home/SNF/Other Home Patient oriented to: self, place, time, and situation Is this baseline? Yes   Triage Complete: Triage complete  Chief Complaint Chest pain, rule out acute myocardial infarction [R07.9]  Triage Note Pt c/o right sided chest pain and SOBx2d. Pt was hypertensive at home 184/110. Pt's wife gave pt his metoprolol at home. Pt is eupneic. Pt does not appear to be in distress at this time   Allergies Allergies  Allergen Reactions   Penicillins Rash and Hives    Has patient had a PCN reaction causing immediate rash, facial/tongue/throat swelling, SOB or lightheadedness with hypotension:unsure Has patient had a PCN reaction causing severe rash involving mucus membranes or skin necrosis:unsure Has patient had a PCN reaction that required hospitalization:No Has patient had a PCN reaction occurring within the last 10 years:No If all of the above answers are "NO", then may proceed with Cephalosporin use.  rash   Doxycycline Rash   Iodinated Contrast Media Rash    Also developed blisters Also developed blisters   Penicillin G Rash   Tramadol Rash    Level of Care/Admitting Diagnosis ED Disposition     ED Disposition  Admit   Condition  --   Comment  Hospital Area: [place] [[id number]]  Level of Care: Telemetry Cardiac [[id number]]  May place patient in observation at [facility] or [place] if equivalent level of care is available:: No  Covid Evaluation: Asymptomatic - no recent exposure (last 10 days) testing not required  Diagnosis: Chest pain, rule out acute myocardial infarction [[id number]]  Admitting Physician: Etta Quill [[id number]]  Attending Physician: Etta Quill [[id number]]          B Medical/Surgery History Past Medical History:  Diagnosis Date    Absence of bladder continence 01/08/2022   Acute bilateral low back pain without sciatica 01/08/2022   Anxiety state 02/02/2022   Arthritis    Asymptomatic LV dysfunction 10/18/2017   B12 deficiency anemia 08/25/2021   Basal cell carcinoma    Benign prostatic hyperplasia with incomplete bladder emptying 01/14/2019   Blood in ear canal, left 05/13/2021   Bone pain 03/08/2020   Cancer ([place])    throat - 1997, throat - 2018   Cardiomyopathy, secondary ([place]) 12/01/2016   Overview:  EF 47% 12/26/16   Chest pain syndrome 11/02/2017   Chronic coronary artery disease 10/18/2017   Chronic cough 05/16/2022   Chronic ischemic heart disease 11/13/1998   Jan 22, 2003 Entered By: Marland Kitchen J Comment:  massive Mi in 2000 per patient hsitory, 2nd Mi in 2001Mar 11, 2004 Entered By: Marland Kitchen J Comment: Had stent placedin 2000,cath/angio in 2001   Chronic neck pain 05/16/2022   Chronic respiratory failure with hypoxia ([place]) 05/16/2022   CKD (chronic kidney disease) stage 3, GFR 30-59 ml/min (HCC) 07/25/2019   COPD (chronic obstructive pulmonary disease) ([place])    Coronary artery disease    Coronary artery disease involving native coronary artery of native heart with angina pectoris ([place]) 12/01/2016   Overview:  He has hx of IWMI in remote past, last cath in 2012 at [facility] showed chronic total occlusion of previously stented RCA, with good collaterals, a 40% LAD stenosis, inferior hypokinesis and EF 45%.    He's been lost to Cardiology f/u since 2015, but has not had  any recurrent events   Deficiency anemia 08/25/2021   Depression    Diabetes mellitus ([place]) 03/08/2020   Diabetic polyneuropathy associated with type 2 diabetes mellitus ([place]) 05/16/2022   Diaphoresis 05/16/2022   Diverticulitis 05/02/2020   Dyslipidemia 12/01/2016   Emphysema lung ([place])    Erectile dysfunction due to diseases classified elsewhere 06/12/2019   Essential hypertension 10/18/2017   GERD (gastroesophageal reflux disease)    Hoarseness  11/15/2016   Hyperlipidemia    Hypertension    Insomnia 01/28/2021   Iron deficiency anemia due to chronic blood loss 08/25/2021   Laceration of thumb, left 12/17/2015   Left flank pain 03/14/2022   Left lower quadrant abdominal tenderness without rebound tenderness 03/14/2022   Lesion of vocal cord    Leukoplakia of larynx 11/15/2016   Long-term use of aspirin therapy 10/02/2018   Malfunction of penile prosthesis ([place]) 01/14/2019   Malignant neoplasm of skin 01/28/2021   Formatting of this note might be different from the original. Jan 22, 2003 Entered By: Marland Kitchen J Comment: of skin - removed x2 inpast Jan 22, 2003 Entered By: Marland Kitchen J Comment: of skin - removed x2 inpast   Melanoma of back ([place])    melanoma on back   Memory loss 03/08/2020   Mood disorder in conditions classified elsewhere 02/02/2022   Myalgia 03/08/2020   Myocardial infarction [facility]) 2000   2 stents   Obstructive chronic bronchitis with exacerbation ([place]) 05/16/2022   Oropharyngeal dysphagia 08/12/2018   Other ill-defined and unknown causes of morbidity and mortality 11/13/1993   Formatting of this note might be different from the original. Jan 31, 2008 Entered By: ARMOUR,ROSS B Comment: lumbar, 8/08 Jan 22, 2003 Entered By: Marland Kitchen J Comment: x67yr in work place  quit 356yrago in 2000 Jan 22, 2003 Entered By: MAMarland Kitchen Comment: [id number] work place  quit [id number] in 2000 Jan 31, 2008 Entered By: ARMOStephannie Petersomment: lumbar, 8/08   PAD (peripheral artery disease) (HCC)[place]/04/2017   Peripheral vascular disease (HCC)[place] iliac artery clot   Peyronie's disease 06/12/2019   Pharyngoesophageal dysphagia 08/12/2018   Pneumonia 02/2015   Pneumothorax 06/07/2020   Preoperative clearance 04/03/2018   Squamous cell carcinoma of larynx (HCC)[place]/31/2018   Testicular pain, left 01/14/2019   Throat cancer (HCC)[place] Tobacco use disorder 06/12/2019   Past Surgical History:  Procedure Laterality Date    ABDOMINAL ANGIOGRAM N/A 01/13/2015   Procedure: ABDOMINAL ANGIOGRAM;  Surgeon: ToddRosetta Posner;  [facility];  Service: Cardiovascular;  Laterality: N/A;   APPENDECTOMY     BACK SURGERY     CARDIAC CATHETERIZATION  2000/2012   with stents in [place] bilateral   ESOPHAGOGASTRODUODENOSCOPY     KNEE SURGERY Left    MICROLARYNGOSCOPY WITH CO2 LASER AND EXCISION OF VOCAL CORD LESION N/A 11/23/2016   Procedure: MICROLARYNGOSCOPY  AND EXCISION OF VOCAL CORD LESION;  Surgeon: JohnMelissa Montane;  Location[facility]NT;  Laterality: N/A;   MICROLARYNGOSCOPY WITH CO2 LASER AND EXCISION OF VOCAL CORD LESION N/A 01/11/2017   Procedure: MICROLARYNGOSCOPY WITH CO2 LASER AND EXCISION OF VOCAL CORD LESION;  Surgeon: JohnMelissa Montane;  Location: [facility] OT[facility]maldervice: ENT;  Laterality: N/A;   MICROLARYNGOSCOPY WITH LASER N/A 03/16/2015   Procedure: MICROLARYNGOSCOPY ;  Surgeon: JohnMelissa Montane;  [facility];  Laterality: N/A;   peyronie's surgery     SHOULDER SURGERY Right    rotator cuff   SPINE SURGERY  09/2020   cervical surgery. steel plate, bone spurs, allograft.   THROAT SURGERY  1997   cancer removed     A IV Location/Drains/Wounds Patient Lines/Drains/Airways Status     Active Line/Drains/Airways     Name Placement date Placement time Site Days   Peripheral IV 11/09/22 20 G Right Antecubital 11/09/22  1729  Antecubital  1   Peripheral IV 11/09/22 20 G Anterior;Right Forearm 11/09/22  1735  Forearm  1   Incision (Closed) 01/11/17 Throat 01/11/17  1426  -- 2129            Intake/Output Last 24 hours  Intake/Output Summary (Last 24 hours) at 11/10/2022 1417 Last data filed at 11/09/2022 2339 Gross per 24 hour  Intake 50 ml  Output --  Net 50 ml    Labs/Imaging Results for orders placed or performed during the hospital encounter of 11/09/22 (from the past 48 hour(s))  Basic metabolic panel     Status:  Abnormal   Collection Time: 11/09/22  5:14 PM  Result Value Ref Range   Sodium 138 135 - 145 mmol/L   Potassium 4.0 3.5 - 5.1 mmol/L   Chloride 103 98 - 111 mmol/L   CO2 26 22 - 32 mmol/L   Glucose, Bld 221 (H) 70 - 99 mg/dL    Comment: Glucose reference range applies only to samples taken after fasting for at least 8 hours.   BUN 11 8 - 23 mg/dL   Creatinine, Ser 0.95 0.61 - 1.24 mg/dL   Calcium 9.3 8.9 - 10.3 mg/dL   GFR, Estimated >60 >60 mL/min    Comment: (NOTE) Calculated using the CKD-EPI Creatinine Equation (2021)    Anion gap 9 5 - 15    Comment: Performed at [facility] [address], [place], [place] [address]  CBC     Status: Abnormal   Collection Time: 11/09/22  5:14 PM  Result Value Ref Range   WBC 10.9 (H) 4.0 - 10.5 K/uL   RBC 5.09 4.22 - 5.81 MIL/uL   Hemoglobin 13.6 13.0 - 17.0 g/dL   HCT 41.3 39.0 - 52.0 %   MCV 81.1 80.0 - 100.0 fL   MCH 26.7 26.0 - 34.0 pg   MCHC 32.9 30.0 - 36.0 g/dL   RDW 15.8 (H) 11.5 - 15.5 %   Platelets 270 150 - 400 K/uL   nRBC 0.0 0.0 - 0.2 %    Comment: Performed at [facility] [address] [place], [place] [address]  Troponin I (High Sensitivity)     Status: None   Collection Time: 11/09/22  5:14 PM  Result Value Ref Range   Troponin I (High Sensitivity) 10 <18 ng/L    Comment: (NOTE) Elevated high sensitivity troponin I (hsTnI) values and significant  changes across serial measurements may suggest ACS but many other  chronic and acute conditions are known to elevate hsTnI results.  Refer to the "Links" section for chest pain algorithms and additional  guidance. Performed at [facility], [place] [address], [place], [place] [address]   Magnesium     Status: Abnormal   Collection Time: 11/09/22  5:14 PM  Result Value Ref Range   Magnesium 1.6 (L) 1.7 - 2.4 mg/dL    Comment: Performed at [facility] [address] [place], [place] [address]  Brain natriuretic peptide     Status: Abnormal    Collection Time: 11/09/22  5:14 PM  Result Value Ref Range   B Natriuretic Peptide 216.5 (H) 0.0 - 100.0 pg/mL    Comment: Performed at [facility] [address] [place], [place] [address]  Troponin I (High Sensitivity)     Status: None   Collection Time: 11/09/22  7:20 PM  Result Value Ref Range   Troponin I (High Sensitivity) 10 <18 ng/L    Comment: (NOTE) Elevated high sensitivity troponin I (hsTnI) values and significant  changes across serial measurements may suggest ACS but many other  chronic and acute conditions are known to elevate hsTnI results.  Refer to the "Links" section for chest pain algorithms and additional  guidance. Performed at [facility], [place] [address], [place], [place] [address]   D-dimer, quantitative     Status: Abnormal   Collection Time: 11/09/22  7:37 PM  Result Value Ref Range   D-Dimer, Quant 0.63 (H) 0.00 - 0.50 ug/mL-FEU    Comment: (NOTE) At the manufacturer cut-off value of 0.5 g/mL FEU, this assay has a negative predictive value of 95-100%.This assay is intended for use in conjunction with a clinical pretest probability (PTP) assessment model to exclude pulmonary embolism (PE) and deep venous thrombosis (DVT) in outpatients suspected of PE or DVT. Results should be correlated with clinical presentation. Performed at [facility], [place] [address], [place], [place] [address]   T4, free     Status: None   Collection Time: 11/09/22  8:52 PM  Result Value Ref Range   Free T4 0.65 0.61 - 1.12 ng/dL    Comment: (NOTE) Biotin ingestion may interfere with free T4 tests. If the results are inconsistent with the TSH level, previous test results, or the clinical presentation, then consider biotin interference. If needed, order repeat testing after stopping biotin. Performed at [facility], [place] [address], [place], [place] [address]   TSH     Status: None   Collection Time: 11/09/22  8:54 PM  Result Value Ref Range    TSH 3.430 0.350 - 4.500 uIU/mL    Comment: Performed by a 3rd Generation assay with a functional sensitivity of <=0.01 uIU/mL. Performed at [facility], [place] [address] [place], [place] [address]   CBG monitoring, ED     Status: Abnormal   Collection Time: 11/09/22  9:39 PM  Result Value Ref Range   Glucose-Capillary 173 (H) 70 - 99 mg/dL    Comment: Glucose reference range applies only to samples taken after fasting for at least 8 hours.   Comment 1 Notify RN   Troponin I (High Sensitivity)     Status: None   Collection Time: 11/09/22 10:04 PM  Result Value Ref Range   Troponin I (High Sensitivity) 12 <18 ng/L    Comment: (NOTE) Elevated high sensitivity troponin I (hsTnI) values and significant  changes across serial measurements may suggest ACS but many other  chronic and acute conditions are known to elevate hsTnI results.  Refer to the "Links" section for chest pain algorithms and additional  guidance. Performed at [facility], [place] [address] [place], [place] [address]   Resp panel by RT-PCR (RSV, Flu A&B, Covid) Anterior Nasal Swab     Status: None   Collection Time: 11/09/22 10:09 PM   Specimen: Anterior Nasal Swab  Result Value Ref Range   SARS Coronavirus 2 by RT  PCR NEGATIVE NEGATIVE    Comment: (NOTE) SARS-CoV-2 target nucleic acids are NOT DETECTED.  The SARS-CoV-2 RNA is generally detectable in upper respiratory specimens during the acute phase of infection. The lowest concentration of SARS-CoV-2 viral copies this assay can detect is 138 copies/mL. A negative result does not preclude SARS-Cov-2 infection and should not be used as the sole basis for treatment or other patient management decisions. A negative result may occur with  improper specimen collection/handling, submission of specimen other than nasopharyngeal swab, presence of viral mutation(s) within the areas targeted by this assay, and inadequate number of viral copies(<138 copies/mL). A  negative result must be combined with clinical observations, patient history, and epidemiological information. The expected result is Negative.  Fact Sheet for Patients:  EntrepreneurPulse.com.au  Fact Sheet for Healthcare Providers:  IncredibleEmployment.be  This test is no t yet approved or cleared by the [place] FDA and  has been authorized for detection and/or diagnosis of SARS-CoV-2 by FDA under an Emergency Use Authorization (EUA). This EUA will remain  in effect (meaning this test can be used) for the duration of the COVID-19 declaration under Section 564(b)(1) of the Act, 21 U.S.C.section 360bbb-3(b)(1), unless the authorization is terminated  or revoked sooner.       Influenza A by PCR NEGATIVE NEGATIVE   Influenza B by PCR NEGATIVE NEGATIVE    Comment: (NOTE) The Xpert Xpress SARS-CoV-2/FLU/RSV plus assay is intended as an aid in the diagnosis of influenza from Nasopharyngeal swab specimens and should not be used as a sole basis for treatment. Nasal washings and aspirates are unacceptable for Xpert Xpress SARS-CoV-2/FLU/RSV testing.  Fact Sheet for Patients: EntrepreneurPulse.com.au  Fact Sheet for Healthcare Providers: IncredibleEmployment.be  This test is not yet approved or cleared by the [place] FDA and has been authorized for detection and/or diagnosis of SARS-CoV-2 by FDA under an Emergency Use Authorization (EUA). This EUA will remain in effect (meaning this test can be used) for the duration of the COVID-19 declaration under Section 564(b)(1) of the Act, 21 U.S.C. section 360bbb-3(b)(1), unless the authorization is terminated or revoked.     Resp Syncytial Virus by PCR NEGATIVE NEGATIVE    Comment: (NOTE) Fact Sheet for Patients: EntrepreneurPulse.com.au  Fact Sheet for Healthcare Providers: IncredibleEmployment.be  This test is not  yet approved or cleared by the [place] FDA and has been authorized for detection and/or diagnosis of SARS-CoV-2 by FDA under an Emergency Use Authorization (EUA). This EUA will remain in effect (meaning this test can be used) for the duration of the COVID-19 declaration under Section 564(b)(1) of the Act, 21 U.S.C. section 360bbb-3(b)(1), unless the authorization is terminated or revoked.  Performed at [facility], [place] [address] [place], [place] [address]   CBG monitoring, ED     Status: Abnormal   Collection Time: 11/10/22  2:18 AM  Result Value Ref Range   Glucose-Capillary 317 (H) 70 - 99 mg/dL    Comment: Glucose reference range applies only to samples taken after fasting for at least 8 hours.   Comment 1 Document in Chart   Troponin I (High Sensitivity)     Status: None   Collection Time: 11/10/22  2:21 AM  Result Value Ref Range   Troponin I (High Sensitivity) 9 <18 ng/L    Comment: (NOTE) Elevated high sensitivity troponin I (hsTnI) values and significant  changes across serial measurements may suggest ACS but many other  chronic and acute conditions are known to elevate hsTnI results.  Refer to the "Links" section for chest pain algorithms and additional  guidance. Performed at [facility], [place] [address] [place], [place] [address]   CBG monitoring, ED     Status: Abnormal   Collection Time: 11/10/22  6:06 AM  Result Value Ref Range   Glucose-Capillary 267 (H) 70 - 99 mg/dL    Comment: Glucose reference range applies only to samples taken after fasting for at least 8 hours.   Comment 1 Document in Chart   CBG monitoring, ED     Status: Abnormal   Collection Time: 11/10/22  8:21 AM  Result Value Ref Range   Glucose-Capillary 235 (H) 70 - 99 mg/dL    Comment: Glucose reference range applies only to samples taken after fasting for at least 8 hours.   Comment 1 Notify RN    Comment 2 Document in Chart   CBG monitoring, ED     Status: Abnormal    Collection Time: 11/10/22  1:45 PM  Result Value Ref Range   Glucose-Capillary 226 (H) 70 - 99 mg/dL    Comment: Glucose reference range applies only to samples taken after fasting for at least 8 hours.   DG Chest Portable 1 View  Result Date: 11/09/2022 CLINICAL DATA:  Right-sided chest pain and shortness of breath x2 days EXAM: PORTABLE CHEST 1 VIEW COMPARISON:  Chest radiograph August 17, 2022 and chest CT October 21, 2022 FINDINGS: Cardiac leads and defibrillator pad project over the chest. The heart size and mediastinal contours are within normal limits. Similar sequela of prior granulomatous disease. No focal airspace consolidation. No visible pleural effusion or pneumothorax. No acute osseous abnormality. Partially visualized anterior cervical fusion hardware. Thoracic spondylosis. IMPRESSION: No acute cardiopulmonary disease. Electronically Signed   By: Dahlia Bailiff M.D.   On: 11/09/2022 18:37    Pending Labs Unresulted Labs (From admission, onward)     Start     Ordered   11/10/22 1218  Magnesium  Once,   R        11/10/22 1217   11/10/22 2505  Basic metabolic panel  Once,   R        11/10/22 1217            Vitals/Pain Today's Vitals   11/10/22 0735 11/10/22 1031 11/10/22 1032 11/10/22 1256  BP: (!) 169/102  (!) 180/109 (!) 167/99  Pulse: 69  78 91  Resp: 16   20  Temp:  98 F (36.7 C)  98.1 F (36.7 C)  TempSrc:  Oral  Oral  SpO2: 100%   100%  Weight:      Height:      PainSc: 0-No pain       Isolation Precautions Airborne and Contact precautions  Medications Medications  ALPRAZolam (XANAX) tablet 0.25 mg (0.25 mg Oral Given 11/10/22 1036)  traZODone (DESYREL) tablet 150 mg (150 mg Oral Given 11/09/22 2148)  insulin aspart (novoLOG) injection 0-15 Units ( Subcutaneous Not Given 11/10/22 1356)  arformoterol (BROVANA) nebulizer solution 15 mcg (15 mcg Nebulization Given 11/10/22 1313)  budesonide (PULMICORT) nebulizer solution 0.25 mg (0.25 mg Nebulization  Given 11/10/22 1313)  revefenacin (YUPELRI) nebulizer solution 175 mcg (175 mcg Nebulization Given 11/10/22 1313)  albuterol (PROVENTIL) (2.5 MG/3ML) 0.083% nebulizer solution 3 mL (has no administration in time range)  metoprolol tartrate (LOPRESSOR) tablet 50 mg (50 mg Oral Given 11/10/22 1032)  cyclobenzaprine (FLEXERIL) tablet 10 mg (has no administration in time range)  rosuvastatin (CRESTOR) tablet 10 mg (10 mg  Oral Given 11/10/22 0013)  levothyroxine (SYNTHROID) tablet 25 mcg (25 mcg Oral Given 11/10/22 0612)  oxyCODONE-acetaminophen (PERCOCET/ROXICET) 5-325 MG per tablet 1 tablet (has no administration in time range)  pantoprazole (PROTONIX) EC tablet 40 mg (40 mg Oral Given 11/10/22 1032)  clopidogrel (PLAVIX) tablet 75 mg (75 mg Oral Given 11/10/22 1033)  tamsulosin (FLOMAX) capsule 0.8 mg (0.8 mg Oral Given 11/10/22 1033)  ipratropium-albuterol (DUONEB) 0.5-2.5 (3) MG/3ML nebulizer solution 3 mL (3 mLs Nebulization Given 11/10/22 1354)  finasteride (PROSCAR) tablet 5 mg (5 mg Oral Given 11/10/22 1033)  gabapentin (NEURONTIN) capsule 400 mg (400 mg Oral Given 11/10/22 1033)  DULoxetine (CYMBALTA) DR capsule 60 mg (60 mg Oral Given 11/10/22 1033)  roflumilast (DALIRESP) tablet 250 mcg (250 mcg Oral Given 11/10/22 1306)  mirabegron ER (MYRBETRIQ) tablet 25 mg (25 mg Oral Given 11/10/22 0212)  acetaminophen (TYLENOL) tablet 650 mg (650 mg Oral Given 11/10/22 1032)  ondansetron (ZOFRAN) injection 4 mg (has no administration in time range)  enoxaparin (LOVENOX) injection 40 mg (40 mg Subcutaneous Given 11/10/22 1355)  losartan (COZAAR) tablet 50 mg (50 mg Oral Given 11/10/22 1315)  perflutren lipid microspheres (DEFINITY) IV suspension (2 mLs Intravenous Given 11/10/22 1356)  albuterol (PROVENTIL) (2.5 MG/3ML) 0.083% nebulizer solution 2.5 mg (2.5 mg Nebulization Given 11/09/22 2037)  methylPREDNISolone sodium succinate (SOLU-MEDROL) 125 mg/2 mL injection 125 mg (125 mg Intravenous Given  11/09/22 2053)  magnesium sulfate IVPB 2 g 50 mL (0 g Intravenous Stopped 11/09/22 2339)    Mobility walks Low fall risk   Focused Assessments Cardiac Assessment Handoff:  Cardiac Rhythm: Normal sinus rhythm No results found for: "CKTOTAL", "CKMB", "CKMBINDEX", "TROPONINI" Lab Results  Component Value Date   DDIMER 0.63 (H) 11/09/2022   Does the Patient currently have chest pain? No    R Recommendations: See Admitting Provider Note  Report given to:   Additional Notes:

## 2022-11-10 NOTE — ED Notes (Signed)
I have called [facility] who states they have reached out to two physicians who might help get the echo read for help toward a DC decision.  Also sent a chat to one of those physicians.

## 2022-11-10 NOTE — Discharge Summary (Signed)
Physician Discharge Summary  Don [name] OVZ:[id number] DOB: 06/03/1951 DOA: 11/09/2022  PCP: Don Brome, MD  Admit date: 11/09/2022 Discharge date: 11/10/2022  Time spent: 40 minutes  Recommendations for Outpatient Follow-up:  Follow outpatient CBC/CMP  Follow with cardiology outpatient Follow blood pressure outpatient    Discharge Diagnoses:  Principal Problem:   Chest pain, rule out acute myocardial infarction Active Problems:   Ventricular ectopy   COPD with chronic bronchitis and emphysema (Don [name])   Essential hypertension   Diabetic polyneuropathy associated with type 2 diabetes mellitus (Don [name])   Chronic pain syndrome  Discharge Condition: stable  Diet recommendation: heart healthy, diabetic  Filed Weights   11/09/22 1710  Weight: 90 kg    History of present illness:  Don [name] is Don [name] 71 y.o. male with medical history significant of COPD gold D, HFrEF from [facility].  HTN, DM2.   Pt presents to ED with c/o CP, SOB, and elevated BP.   Pt with R sided CP and SOB x2 days, BP at home 184/110.  Pt took metoprolol at home.  CP intermittent, improved at the moment.  Pt has chronic cough.   Intermittent diaphoresis as well.  He's back to baseline on hospital day 1.  Cardiology was consulted and recommended starting losartan, transitioning to metoprolol succinate, ambulatory monitor.   Hospital Course:  Assessment and Plan: Chest pain, rule out acute myocardial infarction  HFmrEF Resolved on my evaluation  Troponins negative, d dimer negative when age adjusted EKG with frequent PVCs, frequent PVC's on tele Echo with EF 45-50%, no RWMA Appreciate cardiology recs - decreased EF thought related to HTN or PVC's - CP unlikely ischemic.  Recommended continuing losartan, transition to metoprolol succinate.  Ambulatory monitor to quantitate PVCs.   Ventricular ectopy See CP above.  Essential hypertension Transition to metoprolol succinate Started on losartan BP's  elevated Follow outpatient    COPD with chronic bronchitis and emphysema (HCC) Severe COPD at baseline No wheezing today Continue on home O2   Chronic pain syndrome Cont home pain meds   Diabetic polyneuropathy associated with type 2 diabetes mellitus (Don [name]) Resume home diabetes meds        Procedures: Echo IMPRESSIONS     1. Left ventricular ejection fraction, by estimation, is 45 to 50%. The  left ventricle has low normal function. The left ventricle has no regional  wall motion abnormalities. Left ventricular diastolic parameters are  indeterminate.   2. Right ventricular systolic function is normal. The right ventricular  size is normal.   3. The mitral valve is normal in structure. Trivial mitral valve  regurgitation. No evidence of mitral stenosis.   4. The aortic valve was not well visualized. Aortic valve regurgitation  is not visualized. No aortic stenosis is present.   5. The inferior vena cava is normal in size with greater than 50%  respiratory variability, suggesting right atrial pressure of 3 mmHg.   Comparison(s): Prior images reviewed side by side. Compared to prior echo,  LVEF has decreased mildly.    Consultations: cardiology  Discharge Exam: Vitals:   11/10/22 1032 11/10/22 1256  BP: (!) 180/109 (!) 167/99  Pulse: 78 91  Resp:  20  Temp:  98.1 F (36.7 C)  SpO2:  100%   No new complaints His CP and SOB resolved Wife and he initially frustrated cardiology hadn't seen him yet  General: No acute distress. Cardiovascular: RRR Lungs: unlabored Abdomen: Soft, nontender, nondistended  Neurological: Alert and oriented 3. Moves all extremities 4  with equal strength. Cranial nerves II through XII grossly intact. Extremities: No clubbing or cyanosis. No edema.  Discharge Instructions   Discharge Instructions     Call MD for:  difficulty breathing, headache or visual disturbances   Complete by: As directed    Call MD for:  extreme fatigue    Complete by: As directed    Call MD for:  hives   Complete by: As directed    Call MD for:  persistant dizziness or light-headedness   Complete by: As directed    Call MD for:  persistant nausea and vomiting   Complete by: As directed    Call MD for:  redness, tenderness, or signs of infection (pain, swelling, redness, odor or green/yellow discharge around incision site)   Complete by: As directed    Call MD for:  severe uncontrolled pain   Complete by: As directed    Call MD for:  temperature >100.4   Complete by: As directed    Diet - low sodium heart healthy   Complete by: As directed    Discharge instructions   Complete by: As directed    You were seen for chest pain.  Your work up was notable for ventricular ectopy and your ejection fraction was mildly decreased (heart squeeze or pump).  Cardiology will send you on [name] temporary monitor.    Your blood pressures were significantly elevated here.  We've started you on losartan and have changed your metoprolol to the once daily dosing.    Since you've been started on losartan, you should have repeat labs within 1 week with your PCP to follow up your renal function and electrolytes.   Return for new, recurrent, or worsening symptoms.  Please ask your PCP to request records from this hospitalization so they know what was done and what the next steps will be.   Increase activity slowly   Complete by: As directed       Allergies as of 11/10/2022       Reactions   Penicillins Rash, Hives   Has patient had [name] PCN reaction causing immediate rash, facial/tongue/throat swelling, SOB or lightheadedness with hypotension:unsure Has patient had [name] PCN reaction causing severe rash involving mucus membranes or skin necrosis:unsure Has patient had [name] PCN reaction that required hospitalization:No Has patient had [name] PCN reaction occurring within the last 10 years:No If all of the above answers are "NO", then may proceed with Cephalosporin  use. rash   Doxycycline Rash   Iodinated Contrast Media Rash   Also developed blisters Also developed blisters   Penicillin G Rash   Tramadol Rash        Medication List     TAKE these medications    ALPRAZolam 0.25 MG tablet Commonly known as: XANAX TAKE 1 TABLET BY MOUTH ONCE DAILY AS NEEDED FOR ANXIETY   arformoterol 15 MCG/2ML Nebu Commonly known as: BROVANA Take 2 mLs (15 mcg total) by nebulization 2 (two) times daily.   budesonide 0.25 MG/2ML nebulizer solution Commonly known as: Pulmicort Take 2 mLs (0.25 mg total) by nebulization in the morning and at bedtime.   clopidogrel 75 MG tablet Commonly known as: PLAVIX Take 1 tablet by mouth once daily   cyanocobalamin 1000 MCG/ML injection Commonly known as: VITAMIN B12 Inject 1 mL (1,000 mcg total) into the muscle once Delaynee Alred week.   cyclobenzaprine 10 MG tablet Commonly known as: FLEXERIL Take 1 tablet by mouth three times daily as needed for muscle spasm What changed:  when to take this additional instructions   DULoxetine 60 MG capsule Commonly known as: CYMBALTA Take 1 capsule by mouth twice daily   fenofibrate 160 MG tablet Take 1 tablet (160 mg total) by mouth daily.   finasteride 5 MG tablet Commonly known as: PROSCAR Take 1 tablet by mouth once daily   Fish Oil 1000 MG Caps Take 2 capsules (2,000 mg total) by mouth in the morning and at bedtime.   gabapentin 400 MG capsule Commonly known as: NEURONTIN Take 1 capsule (400 mg total) by mouth 2 (two) times daily.   ipratropium-albuterol 0.5-2.5 (3) MG/3ML Soln Commonly known as: DUONEB USE 1 AMPULE IN NEBULIZER 4 TIMES DAILY What changed:  how much to take how to take this when to take this   levothyroxine 25 MCG tablet Commonly known as: SYNTHROID Take 1 tablet (25 mcg total) by mouth daily.   losartan 50 MG tablet Commonly known as: COZAAR Take 1 tablet (50 mg total) by mouth daily. Start taking on: November 11, 2022   metFORMIN 1000  MG tablet Commonly known as: GLUCOPHAGE Take 1 tablet (1,000 mg total) by mouth 2 (two) times daily with Emori Mumme meal.   metoprolol tartrate 100 MG tablet Commonly known as: LOPRESSOR Take 1 tablet (100 mg total) by mouth daily. What changed:  medication strength how much to take when to take this   Myrbetriq 25 MG Tb24 tablet Generic drug: mirabegron ER Take 1 tablet by mouth once daily What changed: how much to take   nitroGLYCERIN 0.4 MG SL tablet Commonly known as: NITROSTAT DISSOLVE ONE TABLET UNDER THE TONGUE EVERY 5 TO 10 MINUTES PRIOR TO ACTIVITIES WHICH MIGHT PRECIPITATE AN ATTACK What changed: See the new instructions.   nystatin cream Commonly known as: MYCOSTATIN Apply 1 application topically 2 (two) times daily as needed for dry skin.   omeprazole 20 MG capsule Commonly known as: PRILOSEC Take 1 capsule by mouth once daily   OneTouch Delica Plus [id number] Misc USE 1  TO CHECK GLUCOSE THREE TIMES DAILY What changed: See the new instructions.   OneTouch Ultra test strip Generic drug: glucose blood 1 each by Other route in the morning and at bedtime. Use as instructed   oxyCODONE-acetaminophen 5-325 MG tablet Commonly known as: PERCOCET/ROXICET Take 1 tablet by mouth every 6 (six) hours as needed for severe pain.   revefenacin 175 MCG/3ML nebulizer solution Commonly known as: YUPELRI Take 3 mLs (175 mcg total) by nebulization daily.   roflumilast 500 MCG Tabs tablet Commonly known as: DALIRESP Take 0.5 tablets (250 mcg total) by mouth daily for 28 days, THEN 1 tablet (500 mcg total) daily. Start taking on: November 02, 2022   rosuvastatin 40 MG tablet Commonly known as: CRESTOR Take 1 tablet by mouth once daily   tamsulosin 0.4 MG Caps capsule Commonly known as: FLOMAX TAKE 2 CAPSULES BY MOUTH ONCE DAILY AFTER SUPPER What changed: See the new instructions.   Toujeo SoloStar 300 UNIT/ML Solostar Pen Generic drug: insulin glargine (1 Unit Dial) INJECT  30 UNITS SUBCUTANEOUSLY ONCE DAILY What changed: See the new instructions.   traZODone 150 MG tablet Commonly known as: DESYREL Take 1 tablet by mouth once daily   Ventolin HFA 108 (90 Base) MCG/ACT inhaler Generic drug: albuterol INHALE 2 PUFFS BY MOUTH EVERY 6 HOURS AS NEEDED FOR SHORTNESS OF BREATH FOR WHEEZING What changed: See the new instructions.   Vitamin D (Ergocalciferol) 1.25 MG (50000 UNIT) Caps capsule Commonly known as: DRISDOL Take 1 capsule (50,000  Units total) by mouth 2 (two) times Tanicia Wolaver week.       Allergies  Allergen Reactions   Penicillins Rash and Hives    Has patient had Jumanah Hynson PCN reaction causing immediate rash, facial/tongue/throat swelling, SOB or lightheadedness with hypotension:unsure Has patient had Allisa Einspahr PCN reaction causing severe rash involving mucus membranes or skin necrosis:unsure Has patient had Artur Winningham PCN reaction that required hospitalization:No Has patient had Dajanique Robley PCN reaction occurring within the last 10 years:No If all of the above answers are "NO", then may proceed with Cephalosporin use.  rash   Doxycycline Rash   Iodinated Contrast Media Rash    Also developed blisters Also developed blisters   Penicillin G Rash   Tramadol Rash      The results of significant diagnostics from this hospitalization (including imaging, microbiology, ancillary and laboratory) are listed below for reference.    Significant Diagnostic Studies: ECHOCARDIOGRAM COMPLETE  Result Date: 11/10/2022    ECHOCARDIOGRAM REPORT   Patient Name:   Don [name] Date of Exam: 11/10/2022 Medical Rec #:  [id number]          Height:       68.0 in Accession #:    [id number]         Weight:       198.4 lb Date of Birth:  05/16/1951          BSA:          2.037 m Patient Age:    71 years           BP:           180/109 mmHg Patient Gender: M                  HR:           77 bpm. Exam Location:  Inpatient Procedure: 2D Echo, Cardiac Doppler and Color Doppler Indications:    Chest Pain  R07.9  History:        Patient has prior history of Echocardiogram examinations, most                 recent 06/03/2021. Cardiomyopathy, CAD, PAD and COPD,                 Signs/Symptoms:Chest Pain; Risk Factors:Hypertension, Diabetes,                 Dyslipidemia and Current Smoker. Cancer, CKD stage III.  Sonographer:    Ronny Flurry Sonographer#2:  Melissa Morford RDCS (AE, PE) Referring Phys: Talullah Abate CALDWELL Roe  1. Left ventricular ejection fraction, by estimation, is 45 to 50%. The left ventricle has low normal function. The left ventricle has no regional wall motion abnormalities. Left ventricular diastolic parameters are indeterminate.  2. Right ventricular systolic function is normal. The right ventricular size is normal.  3. The mitral valve is normal in structure. Trivial mitral valve regurgitation. No evidence of mitral stenosis.  4. The aortic valve was not well visualized. Aortic valve regurgitation is not visualized. No aortic stenosis is present.  5. The inferior vena cava is normal in size with greater than 50% respiratory variability, suggesting right atrial pressure of 3 mmHg. Comparison(s): Prior images reviewed side by side. Compared to prior echo, LVEF has decreased mildly. FINDINGS  Left Ventricle: Left ventricular ejection fraction, by estimation, is 45 to 50%. The left ventricle has low normal function. The left ventricle has no regional wall motion abnormalities. Definity contrast agent was given IV to  delineate the left ventricular endocardial borders. The left ventricular internal cavity size was normal in size. There is no left ventricular hypertrophy. Left ventricular diastolic parameters are indeterminate. Right Ventricle: The right ventricular size is normal. No increase in right ventricular wall thickness. Right ventricular systolic function is normal. Left Atrium: Left atrial size was normal in size. Right Atrium: Right atrial size was normal in size. Pericardium:  Trivial pericardial effusion is present. Mitral Valve: The mitral valve is normal in structure. Trivial mitral valve regurgitation. No evidence of mitral valve stenosis. Tricuspid Valve: The tricuspid valve is normal in structure. Tricuspid valve regurgitation is trivial. No evidence of tricuspid stenosis. Aortic Valve: The aortic valve was not well visualized. Aortic valve regurgitation is not visualized. No aortic stenosis is present. Aortic valve mean gradient measures 1.0 mmHg. Aortic valve peak gradient measures 2.6 mmHg. Aortic valve area, by VTI measures 4.07 cm. Pulmonic Valve: The pulmonic valve was normal in structure. Pulmonic valve regurgitation is not visualized. No evidence of pulmonic stenosis. Aorta: The aortic root is normal in size and structure. Ascending aorta measurements are within normal limits for age when indexed to body surface area. Venous: The inferior vena cava is normal in size with greater than 50% respiratory variability, suggesting right atrial pressure of 3 mmHg. IAS/Shunts: No atrial level shunt detected by color flow Doppler.  LEFT VENTRICLE PLAX 2D LVIDd:         5.80 cm   Diastology LVIDs:         4.90 cm   LV e' medial:    4.13 cm/s LV PW:         0.90 cm   LV E/e' medial:  12.2 LV IVS:        0.90 cm   LV e' lateral:   8.92 cm/s LVOT diam:     2.50 cm   LV E/e' lateral: 5.6 LV SV:         63 LV SV Index:   31 LVOT Area:     4.91 cm  IVC IVC diam: 1.90 cm LEFT ATRIUM           Index LA diam:      3.50 cm 1.72 cm/m LA Vol (A4C): 14.4 ml 7.07 ml/m  AORTIC VALVE AV Area (Vmax):    4.11 cm AV Area (Vmean):   3.98 cm AV Area (VTI):     4.07 cm AV Vmax:           79.90 cm/s AV Vmean:          52.600 cm/s AV VTI:            0.155 m AV Peak Grad:      2.6 mmHg AV Mean Grad:      1.0 mmHg LVOT Vmax:         66.83 cm/s LVOT Vmean:        42.700 cm/s LVOT VTI:          0.129 m LVOT/AV VTI ratio: 0.83  AORTA Ao Root diam: 3.70 cm MITRAL VALVE MV Area (PHT): 4.60 cm    SHUNTS MV  Decel Time: 165 msec    Systemic VTI:  0.13 m MV E velocity: 50.20 cm/s  Systemic Diam: 2.50 cm MV Kyaire Gruenewald velocity: 96.30 cm/s MV E/Onika Gudiel ratio:  0.52 Cherlynn Kaiser MD Electronically signed by Cherlynn Kaiser MD Signature Date/Time: 11/10/2022/3:40:59 PM    Final    DG Chest Portable 1 View  Result Date: 11/09/2022 CLINICAL DATA:  Right-sided  chest pain and shortness of breath x2 days EXAM: PORTABLE CHEST 1 VIEW COMPARISON:  Chest radiograph August 17, 2022 and chest CT October 21, 2022 FINDINGS: Cardiac leads and defibrillator pad project over the chest. The heart size and mediastinal contours are within normal limits. Similar sequela of prior granulomatous disease. No focal airspace consolidation. No visible pleural effusion or pneumothorax. No acute osseous abnormality. Partially visualized anterior cervical fusion hardware. Thoracic spondylosis. IMPRESSION: No acute cardiopulmonary disease. Electronically Signed   By: Dahlia Bailiff M.D.   On: 11/09/2022 18:37   CT Chest Wo Contrast  Result Date: 11/03/2022 CLINICAL DATA:  COPD, dyspnea on exertion. History of throat cancer and diabetes. EXAM: CT CHEST WITHOUT CONTRAST TECHNIQUE: Multidetector CT imaging of the chest was performed following the standard protocol without IV contrast. RADIATION DOSE REDUCTION: This exam was performed according to the departmental dose-optimization program which includes automated exposure control, adjustment of the mA and/or kV according to patient size and/or use of iterative reconstruction technique. COMPARISON:  CT 04/30/2020.  Radiographs 08/17/2022. FINDINGS: Cardiovascular: Atherosclerosis of the aorta, great vessels and coronary arteries. No acute vascular findings are seen on noncontrast imaging. The heart size is normal. There is stable anterior pericardial thickening versus Kashon Kraynak small amount of pericardial fluid. Mediastinum/Nodes: There are no enlarged mediastinal, hilar or axillary lymph nodes.Hilar assessment is  limited by the lack of intravenous contrast, although the hilar contours appear unchanged. The thyroid gland, trachea and esophagus demonstrate no significant findings. Lungs/Pleura: No pleural effusion or pneumothorax. Moderate centrilobular and paraseptal emphysema with stable biapical scarring. Multiple predominately right-sided pulmonary nodules are again noted, including and densely calcified right lower lobe granuloma. The new right upper lobe nodule seen on the most recent CT is stable over the interval, measuring 4 mm on image 23/5. There is new mild clustered nodularity in the left lower lobe on axial images 70 through 76, likely postinflammatory or mucous impacted terminal bronchioles. No airspace disease. Upper abdomen: No significant findings are seen within the visualized upper abdomen. There are scattered splenic granulomas. Mildly prominent ingested material in the stomach. Previous cholecystectomy. Musculoskeletal/Chest wall: There is no chest wall mass or suspicious osseous finding. Mild spondylosis post lower cervical fusion. IMPRESSION: 1. No acute chest findings identified. 2. New mild clustered nodularity in the left lower lobe, likely postinflammatory or mucous impacted terminal bronchioles. No airspace disease or suspicious nodularity. 3. Additional scattered small pulmonary nodules are unchanged from priors, consistent with Leya Paige benign etiology. 4. Sequela of prior granulomatous disease with calcified right lower lobe granuloma and scattered splenic granulomas. 5. Aortic Atherosclerosis (ICD10-I70.0) and Emphysema (ICD10-J43.9). Electronically Signed   By: Richardean Sale M.D.   On: 11/03/2022 16:58    Microbiology: Recent Results (from the past 240 hour(s))  Resp panel by RT-PCR (RSV, Flu Kerry-Anne Mezo&B, Covid) Anterior Nasal Swab     Status: None   Collection Time: 11/09/22 10:09 PM   Specimen: Anterior Nasal Swab  Result Value Ref Range Status   SARS Coronavirus 2 by RT PCR NEGATIVE NEGATIVE  Final    Comment: (NOTE) SARS-CoV-2 target nucleic acids are NOT DETECTED.  The SARS-CoV-2 RNA is generally detectable in upper respiratory specimens during the acute phase of infection. The lowest concentration of SARS-CoV-2 viral copies this assay can detect is 138 copies/mL. Tatiyanna Lashley negative result does not preclude SARS-Cov-2 infection and should not be used as the sole basis for treatment or other patient management decisions. Ancil Dewan negative result may occur with  improper specimen collection/handling, submission of specimen  other than nasopharyngeal swab, presence of viral mutation(s) within the areas targeted by this assay, and inadequate number of viral copies(<138 copies/mL). Melquisedec Journey negative result must be combined with clinical observations, patient history, and epidemiological information. The expected result is Negative.  Fact Sheet for Patients:  EntrepreneurPulse.com.au  Fact Sheet for Healthcare Providers:  IncredibleEmployment.be  This test is no t yet approved or cleared by the [place] FDA and  has been authorized for detection and/or diagnosis of SARS-CoV-2 by FDA under an Emergency Use Authorization (EUA). This EUA will remain  in effect (meaning this test can be used) for the duration of the COVID-19 declaration under Section 564(b)(1) of the Act, 21 U.S.C.section 360bbb-3(b)(1), unless the authorization is terminated  or revoked sooner.       Influenza Broden Holt by PCR NEGATIVE NEGATIVE Final   Influenza B by PCR NEGATIVE NEGATIVE Final    Comment: (NOTE) The Xpert Xpress SARS-CoV-2/FLU/RSV plus assay is intended as an aid in the diagnosis of influenza from Nasopharyngeal swab specimens and should not be used as Deniah Saia sole basis for treatment. Nasal washings and aspirates are unacceptable for Xpert Xpress SARS-CoV-2/FLU/RSV testing.  Fact Sheet for Patients: EntrepreneurPulse.com.au  Fact Sheet for Healthcare  Providers: IncredibleEmployment.be  This test is not yet approved or cleared by the [place] FDA and has been authorized for detection and/or diagnosis of SARS-CoV-2 by FDA under an Emergency Use Authorization (EUA). This EUA will remain in effect (meaning this test can be used) for the duration of the COVID-19 declaration under Section 564(b)(1) of the Act, 21 U.S.C. section 360bbb-3(b)(1), unless the authorization is terminated or revoked.     Resp Syncytial Virus by PCR NEGATIVE NEGATIVE Final    Comment: (NOTE) Fact Sheet for Patients: EntrepreneurPulse.com.au  Fact Sheet for Healthcare Providers: IncredibleEmployment.be  This test is not yet approved or cleared by the [place] FDA and has been authorized for detection and/or diagnosis of SARS-CoV-2 by FDA under an Emergency Use Authorization (EUA). This EUA will remain in effect (meaning this test can be used) for the duration of the COVID-19 declaration under Section 564(b)(1) of the Act, 21 U.S.C. section 360bbb-3(b)(1), unless the authorization is terminated or revoked.  Performed at [facility], [place] [address] [place], [place] [address]      Labs: Basic Metabolic Panel: Recent Labs  Lab 11/09/22 1714 11/10/22 1400  NA 138 136  K 4.0 4.1  CL 103 104  CO2 26 25  GLUCOSE 221* 227*  BUN 11 12  CREATININE 0.95 0.93  CALCIUM 9.3 8.7*  MG 1.6* 1.9   Liver Function Tests: No results for input(s): "AST", "ALT", "ALKPHOS", "BILITOT", "PROT", "ALBUMIN" in the last 168 hours. No results for input(s): "LIPASE", "AMYLASE" in the last 168 hours. No results for input(s): "AMMONIA" in the last 168 hours. CBC: Recent Labs  Lab 11/09/22 1714  WBC 10.9*  HGB 13.6  HCT 41.3  MCV 81.1  PLT 270   Cardiac Enzymes: No results for input(s): "CKTOTAL", "CKMB", "CKMBINDEX", "TROPONINI" in the last 168 hours. BNP: BNP (last 3 results) Recent Labs     11/09/22 1714  BNP 216.5*    ProBNP (last 3 results) Recent Labs    08/17/22 1015  PROBNP 34.0    CBG: Recent Labs  Lab 11/09/22 2139 11/10/22 0218 11/10/22 0606 11/10/22 0821 11/10/22 1345  GLUCAP 173* 317* 267* 235* 226*       Signed:  Fayrene Helper MD.  [facility] 11/10/2022, 4:24 PM

## 2022-11-10 NOTE — ED Notes (Signed)
Pt's family is expressing concern regarding Cardiology coming to see pt.  Pt is still having  a lot of PVC's.. Also concerned about coming off NPO status and how that relates to Cards visit. I shot ECG for reference and will notify admitting.  PT is not in any respiratory distress at this time.

## 2022-11-10 NOTE — Consult Note (Addendum)
Cardiology Consultation   Patient ID: Don [name] MRN: [id number]; DOB: 09-14-1951  Admit date: 11/09/2022 Date of Consult: 11/10/2022  PCP:  Don Brome, MD   [place] Providers Cardiologist:  None        Patient Profile:   Don [name] is a 71 y.o. male with a hx of COPD (gold D), CAD, HFrEF, HTN, DM, chronic back pain, throat cancer, PAD who is being seen 11/10/2022 for the evaluation of chest pain and exertional dypsnea at the request of Don [name].  History of Present Illness:   Don [name] presented to the ED for evaluation yesterday. Patient and spouse report that he has been experiencing increased exertional dyspnea, orthopnea, and intermittent right chest discomfort x3 days. They originally though that this was due to COPD but became more concerned that there was possible cardiac cause when blood pressure was found to be persistently elevated and patient was intermittently diaphoretic. Apparently patient's blood pressure is usually well controlled but has been noted to be intermittently elevated for several weeks. Patient describes chest pain vaguely, says that he has been noticing it on the right side of his chest primarily with ambulation. This pain is not similar to previous ACS pain and patient has not felt the need to take his nitroglycerin.  Patient has cardiac history that is notable for cardiac catheterization in 2000 when patient received RCA stent. He had repeat catheterization in 2012 which found CTO of RCA with collaterals. Appears that patient was lost to cardiology follow between 2012 and 2015. Subsequently saw Don [name], Don [name], Don [name]. Nuclear stress test in 08/26/2019 showed scar in the inferior wall and no significant reversibility. Most recent [place] in April 2021 at [facility], no change in comparison to the coronary angiogram findings from 2012. Patient then transitioned care to Don [name] in 2022. He was last  seen 05/19/22 and reported exertional dyspnea, chest tightness. Underwent Lexiscan stress which found no evidence of ischemia.   Past Medical History:  Diagnosis Date   Absence of bladder continence 01/08/2022   Acute bilateral low back pain without sciatica 01/08/2022   Anxiety state 02/02/2022   Arthritis    Asymptomatic LV dysfunction 10/18/2017   B12 deficiency anemia 08/25/2021   Basal cell carcinoma    Benign prostatic hyperplasia with incomplete bladder emptying 01/14/2019   Blood in ear canal, left 05/13/2021   Bone pain 03/08/2020   Cancer ([place])    throat - 1997, throat - 2018   Cardiomyopathy, secondary ([place]) 12/01/2016   Overview:  EF 47% 12/26/16   Chest pain syndrome 11/02/2017   Chronic coronary artery disease 10/18/2017   Chronic cough 05/16/2022   Chronic ischemic heart disease 11/13/1998   Jan 22, 2003 Entered By: Marland Kitchen J Comment:  massive Mi in 2000 per patient hsitory, 2nd Mi in 2001Mar 11, 2004 Entered By: Marland Kitchen J Comment: Had stent placedin 2000,cath/angio in 2001   Chronic neck pain 05/16/2022   Chronic respiratory failure with hypoxia ([place]) 05/16/2022   CKD (chronic kidney disease) stage 3, GFR 30-59 ml/min (HCC) 07/25/2019   COPD (chronic obstructive pulmonary disease) ([place])    Coronary artery disease    Coronary artery disease involving native coronary artery of native heart with angina pectoris ([place]) 12/01/2016   Overview:  He has hx of IWMI in remote past, last cath in 2012 at [facility] showed chronic total occlusion of previously stented RCA, with good collaterals, a 40% LAD stenosis, inferior hypokinesis and EF 45%.  He's been lost to Cardiology f/u since 2015, but has not had any recurrent events   Deficiency anemia 08/25/2021   Depression    Diabetes mellitus ([place]) 03/08/2020   Diabetic polyneuropathy associated with type 2 diabetes mellitus ([place]) 05/16/2022   Diaphoresis 05/16/2022   Diverticulitis 05/02/2020   Dyslipidemia 12/01/2016   Emphysema lung  ([place])    Erectile dysfunction due to diseases classified elsewhere 06/12/2019   Essential hypertension 10/18/2017   GERD (gastroesophageal reflux disease)    Hoarseness 11/15/2016   Hyperlipidemia    Hypertension    Insomnia 01/28/2021   Iron deficiency anemia due to chronic blood loss 08/25/2021   Laceration of thumb, left 12/17/2015   Left flank pain 03/14/2022   Left lower quadrant abdominal tenderness without rebound tenderness 03/14/2022   Lesion of vocal cord    Leukoplakia of larynx 11/15/2016   Long-term use of aspirin therapy 10/02/2018   Malfunction of penile prosthesis ([place]) 01/14/2019   Malignant neoplasm of skin 01/28/2021   Formatting of this note might be different from the original. Jan 22, 2003 Entered By: Marland Kitchen J Comment: of skin - removed x2 inpast Jan 22, 2003 Entered By: Marland Kitchen J Comment: of skin - removed x2 inpast   Melanoma of back ([place])    melanoma on back   Memory loss 03/08/2020   Mood disorder in conditions classified elsewhere 02/02/2022   Myalgia 03/08/2020   Myocardial infarction [facility]) 2000   2 stents   Obstructive chronic bronchitis with exacerbation ([place]) 05/16/2022   Oropharyngeal dysphagia 08/12/2018   Other ill-defined and unknown causes of morbidity and mortality 11/13/1993   Formatting of this note might be different from the original. Jan 31, 2008 Entered By: ARMOUR,ROSS B Comment: lumbar, 8/08 Jan 22, 2003 Entered By: Marland Kitchen J Comment: x20yr in work place  quit 362yrago in 2000 Jan 22, 2003 Entered By: MAMarland Kitchen Comment: [id number] work place  quit [id number] in 2000 Jan 31, 2008 Entered By: ARMOStephannie Petersomment: lumbar, 8/08   PAD (peripheral artery disease) (HCC)[place]/04/2017   Peripheral vascular disease (HCC)[place] iliac artery clot   Peyronie's disease 06/12/2019   Pharyngoesophageal dysphagia 08/12/2018   Pneumonia 02/2015   Pneumothorax 06/07/2020   Preoperative clearance 04/03/2018   Squamous cell carcinoma of larynx (HCC)[place]1/31/2018   Testicular pain, left 01/14/2019   Throat cancer (HCC)[place] Tobacco use disorder 06/12/2019    Past Surgical History:  Procedure Laterality Date   ABDOMINAL ANGIOGRAM N/A 01/13/2015   Procedure: ABDOMINAL ANGIOGRAM;  Surgeon: ToddRosetta Posner;  [facility];  Service: Cardiovascular;  Laterality: N/A;   APPENDECTOMY     BACK SURGERY     CARDIAC CATHETERIZATION  2000/2012   with stents in [place] bilateral   ESOPHAGOGASTRODUODENOSCOPY     KNEE SURGERY Left    MICROLARYNGOSCOPY WITH CO2 LASER AND EXCISION OF VOCAL CORD LESION N/A 11/23/2016   Procedure: MICROLARYNGOSCOPY  AND EXCISION OF VOCAL CORD LESION;  Surgeon: JohnMelissa Montane;  [facility];  Laterality: N/A;   MICROLARYNGOSCOPY WITH CO2 LASER AND EXCISION OF VOCAL CORD LESION N/A 01/11/2017   Procedure: MICROLARYNGOSCOPY WITH CO2 LASER AND EXCISION OF VOCAL CORD LESION;  Surgeon: JohnMelissa Montane;  [facility];  Laterality: N/A;   MICROLARYNGOSCOPY WITH LASER N/A 03/16/2015   Procedure: MICROLARYNGOSCOPY ;  Surgeon: Melissa Montane, MD;  Location: [facility] OR;  Service: ENT;  Laterality: N/A;   peyronie's surgery     SHOULDER SURGERY Right    rotator cuff   SPINE SURGERY  09/2020   cervical surgery. steel plate, bone spurs, allograft.   THROAT SURGERY  1997   cancer removed     Home Medications:  Prior to Admission medications   Medication Sig Start Date End Date Taking? Authorizing Provider  ALPRAZolam (XANAX) 0.25 MG tablet TAKE 1 TABLET BY MOUTH ONCE DAILY AS NEEDED FOR ANXIETY 10/31/22   Cox, Kirsten, MD  arformoterol (BROVANA) 15 MCG/2ML NEBU Take 2 mLs (15 mcg total) by nebulization 2 (two) times daily. 09/21/22   [name], Tammy S, NP  budesonide (PULMICORT) 0.25 MG/2ML nebulizer solution Take 2 mLs (0.25 mg total) by nebulization in the morning and at bedtime. 09/21/22   [name], [name], NP  clopidogrel (PLAVIX) 75 MG tablet  Take 1 tablet by mouth once daily Patient taking differently: Take 75 mg by mouth daily. 09/25/22   CoxElnita Maxwell, MD  cyanocobalamin (VITAMIN B12) 1000 MCG/ML injection Inject 1 mL (1,000 mcg total) into the muscle once a week. 10/09/22   Cox, Elnita Maxwell, MD  cyclobenzaprine (FLEXERIL) 10 MG tablet Take 1 tablet by mouth three times daily as needed for muscle spasm Patient taking differently: Take 10 mg by mouth See admin instructions. Take 1 tablet by mouth three times daily as needed for muscle spasm 10/23/22   Cox, Elnita Maxwell, MD  DULoxetine (CYMBALTA) 60 MG capsule Take 1 capsule by mouth twice daily Patient taking differently: Take 60 mg by mouth 2 (two) times daily. 07/31/22   CoxElnita Maxwell, MD  fenofibrate 160 MG tablet Take 1 tablet (160 mg total) by mouth daily. 10/09/22   Cox, Elnita Maxwell, MD  finasteride (PROSCAR) 5 MG tablet Take 1 tablet by mouth once daily Patient taking differently: Take 5 mg by mouth daily. 10/28/22   Cox, Elnita Maxwell, MD  gabapentin (NEURONTIN) 400 MG capsule Take 1 capsule (400 mg total) by mouth 2 (two) times daily. 09/29/22   Cox, Elnita Maxwell, MD  glucose blood (ONETOUCH ULTRA) test strip 1 each by Other route in the morning and at bedtime. Use as instructed 10/03/22   Cox, Elnita Maxwell, MD  ipratropium-albuterol (DUONEB) 0.5-2.5 (3) MG/3ML SOLN USE 1 AMPULE IN NEBULIZER 4 TIMES DAILY Patient taking differently: Take 3 mLs by nebulization See admin instructions. USE 1 AMPULE IN NEBULIZER 4 TIMES DAILY 05/02/22   Cox, Kirsten, MD  Lancets (ONETOUCH DELICA PLUS [id number]) MISC USE 1  TO CHECK GLUCOSE THREE TIMES DAILY Patient taking differently: 1 each by Other route See admin instructions. USE 1 TO CHECK GLUCOSE THREE TIMES DAILY 03/02/22   Cox, Elnita Maxwell, MD  levothyroxine (SYNTHROID) 25 MCG tablet Take 1 tablet (25 mcg total) by mouth daily. 10/09/22   Don Brome, MD  metFORMIN (GLUCOPHAGE) 1000 MG tablet Take 1 tablet (1,000 mg total) by mouth 2 (two) times daily with a meal. 07/03/22    Cox, Kirsten, MD  metoprolol tartrate (LOPRESSOR) 50 MG tablet Take 1 tablet by mouth twice daily Patient taking differently: Take 50 mg by mouth 2 (two) times daily. 09/04/22   Cox, Elnita Maxwell, MD  MYRBETRIQ 25 MG TB24 tablet Take 1 tablet by mouth once daily Patient taking differently: Take 25 mg by mouth daily. 05/22/22   Cox, Elnita Maxwell, MD  nitroGLYCERIN (NITROSTAT) 0.4 MG SL tablet DISSOLVE ONE TABLET UNDER THE TONGUE EVERY 5 TO 10 MINUTES PRIOR TO ACTIVITIES WHICH MIGHT PRECIPITATE  AN ATTACK Patient taking differently: Place 0.4 mg under the tongue every 5 (five) minutes as needed for chest pain. 05/29/22   Marge Duncans, PA-C  nystatin cream (MYCOSTATIN) Apply 1 application topically 2 (two) times daily as needed for dry skin. 01/06/20   [provider]  Omega-3 Fatty Acids (FISH OIL) 1000 MG CAPS Take 2 capsules (2,000 mg total) by mouth in the morning and at bedtime. 12/20/20   Richardo Priest, MD  omeprazole (PRILOSEC) 20 MG capsule Take 1 capsule by mouth once daily Patient taking differently: Take 20 mg by mouth daily. 01/23/22   CoxElnita Maxwell, MD  oxyCODONE-acetaminophen (PERCOCET/ROXICET) 5-325 MG tablet Take 1 tablet by mouth every 6 (six) hours as needed for severe pain. 10/09/22   Cox, Elnita Maxwell, MD  revefenacin (YUPELRI) 175 MCG/3ML nebulizer solution Take 3 mLs (175 mcg total) by nebulization daily. 09/21/22   [name], [name], NP  roflumilast (DALIRESP) 500 MCG TABS tablet Take 0.5 tablets (250 mcg total) by mouth daily for 28 days, THEN 1 tablet (500 mcg total) daily. 11/02/22 11/30/23  Cobb, Karie Schwalbe, NP  rosuvastatin (CRESTOR) 40 MG tablet Take 1 tablet by mouth once daily Patient taking differently: Take 40 mg by mouth daily. 07/31/22   Cox, Elnita Maxwell, MD  tamsulosin (FLOMAX) 0.4 MG CAPS capsule TAKE 2 CAPSULES BY MOUTH ONCE DAILY AFTER SUPPER Patient taking differently: Take 0.8 mg by mouth daily after supper. 05/27/22   Cox, Kirsten, MD  TOUJEO SOLOSTAR 300 UNIT/ML Solostar Pen  INJECT 30 UNITS SUBCUTANEOUSLY ONCE DAILY Patient taking differently: Inject 30 Units into the skin See admin instructions. INJECT 30 UNITS SUBCUTANEOUSLY ONCE DAILY 01/18/22   Cox, Elnita Maxwell, MD  traZODone (DESYREL) 150 MG tablet Take 1 tablet by mouth once daily Patient taking differently: Take 150 mg by mouth daily. 10/23/22   Cox, Kirsten, MD  VENTOLIN HFA 108 (90 Base) MCG/ACT inhaler INHALE 2 PUFFS BY MOUTH EVERY 6 HOURS AS NEEDED FOR SHORTNESS OF BREATH FOR WHEEZING Patient taking differently: Inhale 2 puffs into the lungs See admin instructions. INHALE 2 PUFFS BY MOUTH EVERY 6 HOURS AS NEEDED FOR SHORTNESS OF BREATH FOR WHEEZING 09/14/22   Cox, Kirsten, MD  Vitamin D, Ergocalciferol, (DRISDOL) 1.25 MG (50000 UNIT) CAPS capsule Take 1 capsule (50,000 Units total) by mouth 2 (two) times a week. 10/09/22   CoxElnita Maxwell, MD    Inpatient Medications: Scheduled Meds:  arformoterol  15 mcg Nebulization BID   budesonide  0.25 mg Nebulization BID   clopidogrel  75 mg Oral Daily   DULoxetine  60 mg Oral BID   enoxaparin (LOVENOX) injection  40 mg Subcutaneous Q24H   finasteride  5 mg Oral Daily   gabapentin  400 mg Oral BID   insulin aspart  0-15 Units Subcutaneous Q4H   ipratropium-albuterol  3 mL Nebulization BID   levothyroxine  25 mcg Oral Daily   losartan  50 mg Oral Daily   metoprolol tartrate  50 mg Oral BID   mirabegron ER  25 mg Oral QHS   pantoprazole  40 mg Oral Daily   revefenacin  175 mcg Nebulization Daily   roflumilast  250 mcg Oral Daily   rosuvastatin  10 mg Oral QHS   tamsulosin  0.8 mg Oral Daily   traZODone  150 mg Oral QHS   Continuous Infusions:  PRN Meds: acetaminophen, albuterol, ALPRAZolam, cyclobenzaprine, ondansetron (ZOFRAN) IV, oxyCODONE-acetaminophen  Allergies:    Allergies  Allergen Reactions   Penicillins Rash and Hives  Has patient had a PCN reaction causing immediate rash, facial/tongue/throat swelling, SOB or lightheadedness with  hypotension:unsure Has patient had a PCN reaction causing severe rash involving mucus membranes or skin necrosis:unsure Has patient had a PCN reaction that required hospitalization:No Has patient had a PCN reaction occurring within the last 10 years:No If all of the above answers are "NO", then may proceed with Cephalosporin use.  rash   Doxycycline Rash   Iodinated Contrast Media Rash    Also developed blisters Also developed blisters   Penicillin G Rash   Tramadol Rash    Social History:   Social History   Socioeconomic History   Marital status: Married    Spouse name: Not on file   Number of children: 4   Years of education: Not on file   Highest education level: GED or equivalent  Occupational History   Occupation: retired  Tobacco Use   Smoking status: Every Day    Packs/day: 1.00    Years: 50.00    Total pack years: 50.00    Types: Cigarettes   Smokeless tobacco: Never   Tobacco comments:    down to .5 ppd  11/02/2022 hfb  Vaping Use   Vaping Use: Former  Substance and Sexual Activity   Alcohol use: No    Alcohol/week: 0.0 standard drinks of alcohol   Drug use: No   Sexual activity: Not on file  Other Topics Concern   Not on file  Social History Narrative   Lives with wife       One level      Right hand   Social Determinants of Health   Financial Resource Strain: Low Risk  (07/19/2022)   Overall Financial Resource Strain (CARDIA)    Difficulty of Paying Living Expenses: Not hard at all  Food Insecurity: Not on file  Transportation Needs: No Transportation Needs (07/19/2022)   PRAPARE - Hydrologist (Medical): No    Lack of Transportation (Non-Medical): No  Physical Activity: Not on file  Stress: Not on file  Social Connections: Not on file  Intimate Partner Violence: Not on file    Family History:    Family History  Problem Relation Age of Onset   Cancer Mother        bone   Hypertension Mother    Stroke Father     Hypertension Father    Healthy Child    Cancer Other        brain   Diabetes Other      ROS:  Please see the history of present illness.   All other ROS reviewed and negative.     Physical Exam/Data:   Vitals:   11/10/22 0608 11/10/22 0735 11/10/22 1031 11/10/22 1032  BP:  (!) 169/102  (!) 180/109  Pulse:  69  78  Resp:  16    Temp: 98.1 F (36.7 C)  98 F (36.7 C)   TempSrc:   Oral   SpO2:  100%    Weight:      Height:        Intake/Output Summary (Last 24 hours) at 11/10/2022 1112 Last data filed at 11/09/2022 2339 Gross per 24 hour  Intake 50 ml  Output --  Net 50 ml      11/09/2022    5:10 PM 11/02/2022   10:27 AM 09/29/2022   11:49 AM  Last 3 Weights  Weight (lbs) 198 lb 6.6 oz 198 lb 6.4 oz 193 lb  Weight (  kg) 90 kg 89.994 kg 87.544 kg     Body mass index is 30.17 kg/m.  General:  Well nourished, well developed, in no acute distress HEENT: normal Neck: no JVD Vascular: No carotid bruits; Distal pulses 2+ bilaterally Cardiac:  normal S1, S2; slightly irregular with frequent PVCs; no murmur  Lungs:  Diffusely diminished, quiet inspiratory wheezing. No rales. Abd: soft, nontender, no hepatomegaly  Ext: no edema Musculoskeletal:  No deformities, BUE and BLE strength normal and equal Skin: warm and dry  Neuro:  CNs 2-12 intact, no focal abnormalities noted Psych:  Normal affect   EKG:  The EKG was personally reviewed and demonstrates:  RBBB (known) and frequent PVCs. Telemetry:  Telemetry was personally reviewed and demonstrates:  sinus rhythm, frequent PVCs  Relevant CV Studies:  02/20/2020 [facility])  PROCEDURE:  Coronary angiography, Left heart cath, LV gram, PCI  Coronary Findings Diagnostic Dominance: Right Left Main: The vessel was visualized by angiography, is large and is angiographically normal. Left Anterior Descending: Mid LAD lesion is 30% stenosed. First Obtuse Marginal Branch: 1st Mrg lesion is 25%  stenosed. Right Coronary Artery: Dist RCA filled by collaterals from Dist Cx. Mid RCA filled by collaterals from Prox RCA. Prox RCA lesion is 100% stenosed. The lesion is chronically occluded. The lesion was previously treated using a stent of unknown type. Previous treatment took place >2 years ago.  Intervention No interventions have been documented.  Left Ventricle The left ventricle is mildly dilated. There is moderate left ventricular systolic dysfunction. LV end diastolic pressure is normal.  Wall Motion All segments of the heart are hypokinetic.    DIAGNOSTIC FINDINGS:   1.  There is no change in comparison to the coronary angiogram findings from 2012. 2.  There is a chronic total occlusion of the right coronary artery and site of a previously deployed stent. 3.  There is mild, nonobstructive CAD otherwise. 4.  Moderately reduced LV systolic function, EF 00%. 5.  Normal LVEDP, 10 mmHg. 6.  Inferior hypokinesis with global hypokinesis otherwise.   05/24/2022 Lexiscan stress    The study is normal. The study is intermediate risk.   LV perfusion is abnormal. There is no evidence of ischemia. There is evidence of infarction. Defect 1: There is a medium defect with moderate reduction in uptake present in the inferior location(s) that is fixed. There is abnormal wall motion in the defect area. Consistent with infarction.   Left ventricular function is abnormal. Global function is mildly reduced. Nuclear stress EF: 49 %. The left ventricular ejection fraction is mildly decreased (45-54%). End diastolic cavity size is normal.   Prior study not available for comparison.  Laboratory Data:  High Sensitivity Troponin:   Recent Labs  Lab 11/09/22 1714 11/09/22 1920 11/09/22 2204 11/10/22 0221  TROPONINIHS '10 10 12 9     '$ Chemistry Recent Labs  Lab 11/09/22 1714  NA 138  K 4.0  CL 103  CO2 26  GLUCOSE 221*  BUN 11  CREATININE 0.95  CALCIUM 9.3  MG 1.6*  GFRNONAA >60   ANIONGAP 9    No results for input(s): "PROT", "ALBUMIN", "AST", "ALT", "ALKPHOS", "BILITOT" in the last 168 hours. Lipids No results for input(s): "CHOL", "TRIG", "HDL", "LABVLDL", "LDLCALC", "CHOLHDL" in the last 168 hours.  Hematology Recent Labs  Lab 11/09/22 1714  WBC 10.9*  RBC 5.09  HGB 13.6  HCT 41.3  MCV 81.1  MCH 26.7  MCHC 32.9  RDW 15.8*  PLT 270  Thyroid  Recent Labs  Lab 11/09/22 2052 11/09/22 2054  TSH  --  3.430  FREET4 0.65  --     BNP Recent Labs  Lab 11/09/22 1714  BNP 216.5*    DDimer  Recent Labs  Lab 11/09/22 1937  DDIMER 0.63*     Radiology/Studies:  DG Chest Portable 1 View  Result Date: 11/09/2022 CLINICAL DATA:  Right-sided chest pain and shortness of breath x2 days EXAM: PORTABLE CHEST 1 VIEW COMPARISON:  Chest radiograph August 17, 2022 and chest CT October 21, 2022 FINDINGS: Cardiac leads and defibrillator pad project over the chest. The heart size and mediastinal contours are within normal limits. Similar sequela of prior granulomatous disease. No focal airspace consolidation. No visible pleural effusion or pneumothorax. No acute osseous abnormality. Partially visualized anterior cervical fusion hardware. Thoracic spondylosis. IMPRESSION: No acute cardiopulmonary disease. Electronically Signed   By: Dahlia Bailiff M.D.   On: 11/09/2022 18:37     Assessment and Plan:   Chest pain CAD  Patient has cardiac history that is notable for cardiac catheterization in 2000 when patient received RCA stent. He had repeat catheterization in 2012 which found CTO of RCA with collaterals. Nuclear stress test in 08/26/2019 showed scar in the inferior wall and no significant reversibility. Most recent [place] in April 2021 at [facility], no change in comparison to the coronary angiogram findings from 2012. Lexiscan stress on 05/24/22 found no evidence of ischemia.   Patient's chest pain is atypical and appears to be minimal. Overall reassuring  ACS workup with negative troponin 10 ->10->12->9. ECG with known RBBB, no acute ischemic changes. Telemetry review shows frequent PVCs (noted on previous ECGs), possible slight increase in frequency now. Suspect that pain is primarily due to uncontrolled hypertension. If he continues to have regular chest pain with exertion despite normalization of BP, could consider another stress test though overall low suspicion that there would be significant changes from July 2023 stress test. Will follow TTE that has been ordered. Continue Plavix, Metoprolol. Consider adding Imdur for angina.  HFmrEF ICM  Patient with LVEF 45-54% per 05/24/22 Lexiscan stress test. Does not appear to be acutely volume up today, no evidence of acute HF. BNP 216.5.   Will assess LVEF with TTE today Recommend tighter BP control and optimization of GDMT. Will review SGLT2 cost with pharmacy. Could also consider adding Spironolactone.  Hypertension  Patient with history of hypertension that has been well managed until recently with Metoprolol Tartrate '50mg'$  BID.   Losartan added today As above, consider adding additional HF GDMT based on echo results.  PVCs  Patient's spouse reporting concerns about frequency of PVCs. I have reviewed current telemetry as well as previous ECG tracings and overall burden appears similar. Patient adamantly denies having any sensation of palpitations or skipped beats. Could consider outpatient monitor to further assess overall burden.  Continue Metoprolol  Per Primary team:  COPD DM   Risk Assessment/Risk Scores:                For questions or updates, please contact [place] Please consult [email or web address] for contact info under    Signed, Lily Kocher, PA-C  11/10/2022 11:12 AM  Patient seen and examined with Lily Kocher, PA-C.  Agree as above, with the following exceptions and changes as noted below.  Patient is a 71 year old gentleman presenting with  atypical chest pain on the right side, and different symptom than his typical angina.  No sniffing and typical angina reported  lately.  Gen: NAD, CV: RRR, no murmurs, Lungs: clear, Abd: soft, Extrem: Warm, well perfused, no edema, Neuro/Psych: alert and oriented x 3, normal mood and affect. All available labs, radiology testing, previous records reviewed.  Patient feels well and would like to go home.  He is having frequent PVCs and on echocardiogram there is beat-to-beat variability but EF appears around 50%.  This is somewhat decreased from the prior echocardiogram.  I suspect this is either hypertension related or PVC mediated, with normal troponins and atypical chest pain, less likely to be ischemic in origin.  He does however have a history of CAD and this should be considered if PVC burden is low and hypertension is well-controlled and EF remains mildly reduced.  He has been started on losartan and I would continue this, will transition his metoprolol from tartrate to succinate for the benefit of mildly reduced EF as well as treatment of PVCs.  Could use metoprolol succinate 100 mg daily as it appears he is on 50 mg twice daily at home.  Will order an ambulatory monitor to quantitate PVCs.  Will attempt to expedite his cardiology follow-up.  Patient states he cannot afford to remain in the hospital and feels back to baseline and would let us know if he did not feel well and felt he needed to stay.  Is reasonable to follow him and close follow-up with the above-mentioned plan.  Wife at the bedside who provides collaborative history and is in agreement with the plan.  Elouise Munroe, MD 11/10/22 4:05 PM

## 2022-11-10 NOTE — Telephone Encounter (Signed)
Received the following message from the patient's wife:  "We are at [facility] and my husband is having heart issues, we have yet to see a cardiologist  Even tho it's pvc it's been a lot of pvc with 3 at once and BP 185/138 I'm really disappointed in who every was on call Thursday the 28 th"  I reached out to my administrator and she called [facility]. They stated that they have sent the referral to the cardiology team for them to see the patient and the cardiology team is actively working to see the patient as soon as possible. I relayed this information to the patient's wife and she was appreciative for [place] reaching out and trying to help.

## 2022-11-10 NOTE — ED Notes (Signed)
DC instructions reviewed with pt. PT verbalized understanding. PT DC °

## 2022-11-14 ENCOUNTER — Telehealth: Payer: Self-pay | Admitting: Cardiology

## 2022-11-14 MED ORDER — METOPROLOL SUCCINATE ER 100 MG PO TB24: 100.0000 mg | ORAL_TABLET | Freq: Every day | ORAL | 3 refills | Status: DC

## 2022-11-14 NOTE — Telephone Encounter (Signed)
Pt c/o medication issue:  1. Name of Medication:   metoprolol tartrate (LOPRESSOR) 100 MG tablet    2. How are you currently taking this medication (dosage and times per day)? Take 1 tablet (100 mg total) by mouth daily.   3. Are you having a reaction (difficulty breathing--STAT)? No  4. What is your medication issue? Wife states that upon ED visit pt was prescribed to take Metoprolol ER. She says that is not what was called into pharmacy, above medication was. She would like a callback regarding this matter

## 2022-11-14 NOTE — Telephone Encounter (Signed)
Cardiology consult reviewed and pt was to switch to Toprol XL 100 mg daily. RX sent to [facility]. Gaston Islam, per [facility] aware. Anne Ng verbalized understanding and had no additional questions.

## 2022-11-16 ENCOUNTER — Encounter: Payer: Self-pay | Admitting: Cardiology

## 2022-11-16 DIAGNOSIS — R002 Palpitations: Secondary | ICD-10-CM

## 2022-11-16 DIAGNOSIS — J9610 Chronic respiratory failure, unspecified whether with hypoxia or hypercapnia: Secondary | ICD-10-CM | POA: Diagnosis not present

## 2022-11-16 DIAGNOSIS — J449 Chronic obstructive pulmonary disease, unspecified: Secondary | ICD-10-CM | POA: Diagnosis not present

## 2022-11-17 ENCOUNTER — Encounter: Payer: Self-pay | Admitting: Cardiology

## 2022-11-17 ENCOUNTER — Telehealth: Payer: Self-pay | Admitting: Cardiology

## 2022-11-17 ENCOUNTER — Ambulatory Visit: Payer: Medicare Other | Attending: Cardiology

## 2022-11-17 DIAGNOSIS — R002 Palpitations: Secondary | ICD-10-CM

## 2022-11-17 NOTE — Telephone Encounter (Signed)
This monitor was ordered under you as you agreed to a provider switch and he has a FU with you early February.

## 2022-11-17 NOTE — Telephone Encounter (Signed)
error 

## 2022-11-17 NOTE — Telephone Encounter (Signed)
Advised that pt would need to come to have the monitor placed. Pt requested monitor be mailed.

## 2022-11-17 NOTE — Telephone Encounter (Signed)
Pt was in the ED at [facility] and the provider put a note in regarding a monitor. Please see below and advise what monitor the pt needs.  Your work up was notable for ventricular ectopy and your ejection fraction was mildly decreased (heart squeeze or pump). Cardiology will send you on a temporary monitor.

## 2022-11-17 NOTE — Telephone Encounter (Signed)
Patient's wife called stating her husband still has not received the monitor he should have gotten this week.  He was told when he was discharged that one would be mailed to him.

## 2022-11-17 NOTE — Addendum Note (Signed)
Addended by: Truddie Hidden on: 11/17/2022 08:45 AM   Modules accepted: Orders

## 2022-11-17 NOTE — Telephone Encounter (Signed)
Pt spouse calling back from [employer]. She wants to know if her daughter can come pick it up.

## 2022-11-18 ENCOUNTER — Other Ambulatory Visit: Payer: Self-pay | Admitting: Family Medicine

## 2022-11-19 ENCOUNTER — Other Ambulatory Visit: Payer: Self-pay | Admitting: Family Medicine

## 2022-11-20 ENCOUNTER — Telehealth: Payer: Self-pay

## 2022-11-20 MED ORDER — OXYCODONE-ACETAMINOPHEN 5-325 MG PO TABS: 1.0000 | ORAL_TABLET | Freq: Four times a day (QID) | ORAL | 0 refills | Status: DC | PRN

## 2022-11-20 NOTE — Progress Notes (Signed)
Care Management & Coordination Services Pharmacy Team  Reason for Encounter: Diabetes  Contacted patient on 11/20/2022 to discuss diabetes disease state.   Recent office visits:  10-26-2022 Don [name], CMA. Abnormal UA.   09-29-2022 Don Brome, MD. B12= 221. WBC= 12.9, MCH= 25.7, Neutrophils Absolute= 8.6. Glucose= 197. A1C= 7.5. Trig= 292, HDL= 24, VLDL= 44. Methylmalonic Acid= 452. TSH= 4.950. Vit D= 17.6. STOP breo ellipta.    09-22-2022 Don Noe, LPN. Covid booster vaccine given.   08-30-2022 Don Brome, MD. FINISHED keflex. START levofloxacin 500 mg daily and prednisone 20 mg follow instructions.   Recent consult visits:  11-02-2022 Don Bibles, NP (Pulmonology). START Daliresp 500 mcg Take 0.5 tablets (250 mcg total) by mouth daily for 28 days, THEN 1 tablet (500 mcg total) daily.     10-26-2022 Brand Males, MD (Pulmonology). Pulmonary function test.   08-17-2022 [name], [name], NP (Pulmonary). Glucose= 239. WBC= 11.4, Hemo= 12.9, HCT= 38.8, RDW= 16.1. STOP Breo ellipta and spiriva. START brovana 2 mls by nebulization twice daily, pulmicort Take 2 mLs (0.5 mg total) by nebulization in the morning and at bedtime and Yupelri 175 mcg by nebulization daily. Pulmonary function and CT Chest Wo Contrast ordered.Flu vaccine given.    Hospital visits:  Medication Reconciliation was completed by comparing discharge summary, patient's EMR and Pharmacy list, and upon discussion with patient.  Admitted to the hospital on 11-09-2022 due to Ventricular ectopy. Discharge date was 11-10-2022. Discharged from [place]?Medications Started at [facility] Discharge:?? Losartan 50 mg daily  Medication Changes at Hospital Discharge: Metoprolol 50 mg twice daily TO 100 mg daily  Medications Discontinued at Hospital Discharge: None  Medications that remain the same after Hospital Discharge:??  -All other medications will remain the same.     Medications: Outpatient Encounter Medications as of 11/20/2022  Medication Sig   ALPRAZolam (XANAX) 0.25 MG tablet TAKE 1 TABLET BY MOUTH ONCE DAILY AS NEEDED FOR ANXIETY   arformoterol (BROVANA) 15 MCG/2ML NEBU Take 2 mLs (15 mcg total) by nebulization 2 (two) times daily.   budesonide (PULMICORT) 0.25 MG/2ML nebulizer solution Take 2 mLs (0.25 mg total) by nebulization in the morning and at bedtime.   clopidogrel (PLAVIX) 75 MG tablet Take 1 tablet by mouth once daily (Patient taking differently: Take 75 mg by mouth daily.)   cyanocobalamin (VITAMIN B12) 1000 MCG/ML injection Inject 1 mL (1,000 mcg total) into the muscle once a week.   cyclobenzaprine (FLEXERIL) 10 MG tablet Take 1 tablet by mouth three times daily as needed for muscle spasm (Patient taking differently: Take 10 mg by mouth See admin instructions. Take 1 tablet by mouth three times daily as needed for muscle spasm)   DULoxetine (CYMBALTA) 60 MG capsule Take 1 capsule by mouth twice daily (Patient taking differently: Take 60 mg by mouth 2 (two) times daily.)   fenofibrate 160 MG tablet Take 1 tablet (160 mg total) by mouth daily.   finasteride (PROSCAR) 5 MG tablet Take 1 tablet by mouth once daily (Patient taking differently: Take 5 mg by mouth daily.)   gabapentin (NEURONTIN) 400 MG capsule Take 1 capsule (400 mg total) by mouth 2 (two) times daily.   glucose blood (ONETOUCH ULTRA) test strip USE 1 STRIP TO CHECK GLUCOSE IN THE MORNING AND 1 AT BEDTIME AS DIRECTED   ipratropium-albuterol (DUONEB) 0.5-2.5 (3) MG/3ML SOLN USE 1 AMPULE IN NEBULIZER 4 TIMES DAILY (Patient taking differently: Take 3 mLs by nebulization See admin instructions.  USE 1 AMPULE IN NEBULIZER 4 TIMES DAILY)   Lancets (ONETOUCH DELICA PLUS [id number]) MISC USE 1  TO CHECK GLUCOSE THREE TIMES DAILY (Patient taking differently: 1 each by Other route See admin instructions. USE 1 TO CHECK GLUCOSE THREE TIMES DAILY)   levothyroxine (SYNTHROID) 25 MCG tablet Take 1  tablet (25 mcg total) by mouth daily.   losartan (COZAAR) 50 MG tablet Take 1 tablet (50 mg total) by mouth daily.   metFORMIN (GLUCOPHAGE) 1000 MG tablet Take 1 tablet (1,000 mg total) by mouth 2 (two) times daily with a meal.   metoprolol succinate (TOPROL-XL) 100 MG 24 hr tablet Take 1 tablet (100 mg total) by mouth daily. Take with or immediately following a meal.   MYRBETRIQ 25 MG TB24 tablet Take 1 tablet by mouth once daily (Patient taking differently: Take 25 mg by mouth daily.)   nitroGLYCERIN (NITROSTAT) 0.4 MG SL tablet DISSOLVE ONE TABLET UNDER THE TONGUE EVERY 5 TO 10 MINUTES PRIOR TO ACTIVITIES WHICH MIGHT PRECIPITATE AN ATTACK (Patient taking differently: Place 0.4 mg under the tongue every 5 (five) minutes as needed for chest pain.)   nystatin cream (MYCOSTATIN) Apply 1 application topically 2 (two) times daily as needed for dry skin.   Omega-3 Fatty Acids (FISH OIL) 1000 MG CAPS Take 2 capsules (2,000 mg total) by mouth in the morning and at bedtime.   omeprazole (PRILOSEC) 20 MG capsule Take 1 capsule by mouth once daily (Patient taking differently: Take 20 mg by mouth daily.)   oxyCODONE-acetaminophen (PERCOCET/ROXICET) 5-325 MG tablet Take 1 tablet by mouth every 6 (six) hours as needed for severe pain.   revefenacin (YUPELRI) 175 MCG/3ML nebulizer solution Take 3 mLs (175 mcg total) by nebulization daily.   roflumilast (DALIRESP) 500 MCG TABS tablet Take 0.5 tablets (250 mcg total) by mouth daily for 28 days, THEN 1 tablet (500 mcg total) daily.   rosuvastatin (CRESTOR) 40 MG tablet Take 1 tablet by mouth once daily (Patient taking differently: Take 40 mg by mouth daily.)   tamsulosin (FLOMAX) 0.4 MG CAPS capsule TAKE 2 CAPSULES BY MOUTH ONCE DAILY AFTER SUPPER (Patient taking differently: Take 0.8 mg by mouth daily after supper.)   TOUJEO SOLOSTAR 300 UNIT/ML Solostar Pen INJECT 30 UNITS SUBCUTANEOUSLY ONCE DAILY (Patient taking differently: Inject 30 Units into the skin See admin  instructions. INJECT 30 UNITS SUBCUTANEOUSLY ONCE DAILY)   traZODone (DESYREL) 150 MG tablet Take 1 tablet by mouth once daily (Patient taking differently: Take 150 mg by mouth daily.)   VENTOLIN HFA 108 (90 Base) MCG/ACT inhaler INHALE 2 PUFFS BY MOUTH EVERY 6 HOURS AS NEEDED FOR SHORTNESS OF BREATH FOR WHEEZING (Patient taking differently: Inhale 2 puffs into the lungs See admin instructions. INHALE 2 PUFFS BY MOUTH EVERY 6 HOURS AS NEEDED FOR SHORTNESS OF BREATH FOR WHEEZING)   Vitamin D, Ergocalciferol, (DRISDOL) 1.25 MG (50000 UNIT) CAPS capsule Take 1 capsule (50,000 Units total) by mouth 2 (two) times a week.   No facility-administered encounter medications on file as of 11/20/2022.    Recent Relevant Labs: Lab Results  Component Value Date/Time   HGBA1C 7.5 (H) 09/29/2022 12:39 PM   HGBA1C 7.9 (H) 05/19/2022 08:23 AM   MICROALBUR 30 05/23/2021 09:39 AM   MICROALBUR 80 08/23/2020 02:39 PM    Kidney Function Lab Results  Component Value Date/Time   CREATININE 0.93 11/10/2022 02:00 PM   CREATININE 0.95 11/09/2022 05:14 PM   GFR 74.13 08/17/2022 10:15 AM   GFRNONAA >60 11/10/2022 02:00 PM  GFRAA 100 12/16/2020 09:13 AM    11-20-2022: 1st attempt left VM 11-21-2022: 2nd attempt left VM 11-22-2022: 3rd attempt left VM  Adherence Review: Is the patient currently on a STATIN medication? Yes Is the patient currently on ACE/ARB medication? Yes Does the patient have >5 day gap between last estimated fill dates? No  Star Rating Drugs:  Rosuvastatin 40 mg- Last filled 10-26-2022 90 DS. Previous 07-31-2022 90 DS Metformin 1000 mg- Last filled 10-01-2022 90 DS. Previous 07-03-2022 90 DS    Don [name]

## 2022-11-21 ENCOUNTER — Other Ambulatory Visit: Payer: Self-pay

## 2022-11-22 DIAGNOSIS — R002 Palpitations: Secondary | ICD-10-CM

## 2022-11-23 ENCOUNTER — Other Ambulatory Visit: Payer: Self-pay | Admitting: Family Medicine

## 2022-11-23 DIAGNOSIS — E1142 Type 2 diabetes mellitus with diabetic polyneuropathy: Secondary | ICD-10-CM

## 2022-11-25 ENCOUNTER — Encounter: Payer: Self-pay | Admitting: Oncology

## 2022-11-27 ENCOUNTER — Other Ambulatory Visit: Payer: Self-pay | Admitting: Family Medicine

## 2022-11-29 DIAGNOSIS — D225 Melanocytic nevi of trunk: Secondary | ICD-10-CM | POA: Diagnosis not present

## 2022-11-29 DIAGNOSIS — D2239 Melanocytic nevi of other parts of face: Secondary | ICD-10-CM | POA: Diagnosis not present

## 2022-11-29 DIAGNOSIS — L814 Other melanin hyperpigmentation: Secondary | ICD-10-CM | POA: Diagnosis not present

## 2022-11-29 DIAGNOSIS — L728 Other follicular cysts of the skin and subcutaneous tissue: Secondary | ICD-10-CM | POA: Diagnosis not present

## 2022-11-29 DIAGNOSIS — M5116 Intervertebral disc disorders with radiculopathy, lumbar region: Secondary | ICD-10-CM | POA: Diagnosis not present

## 2022-11-29 DIAGNOSIS — D485 Neoplasm of uncertain behavior of skin: Secondary | ICD-10-CM | POA: Diagnosis not present

## 2022-12-02 ENCOUNTER — Other Ambulatory Visit: Payer: Self-pay | Admitting: Family Medicine

## 2022-12-08 ENCOUNTER — Telehealth: Payer: Self-pay

## 2022-12-08 NOTE — Telephone Encounter (Signed)
Pt's wife LVM on nurse line stating, "I would like Dr Hinton Rao to see Don [name]. She has always been very thorough. He is nauseated all the time, whether he eats or not. He has seen ENDO, and they didn't find anything. This has been going on for long time. No vomiting. His stomach is sore to touch. He hasn't had a scan". She just want Dr Remi Deter opinion.

## 2022-12-12 ENCOUNTER — Ambulatory Visit: Payer: Medicare Other | Admitting: Internal Medicine

## 2022-12-12 ENCOUNTER — Other Ambulatory Visit: Payer: Self-pay | Admitting: Family Medicine

## 2022-12-13 ENCOUNTER — Other Ambulatory Visit: Payer: Self-pay | Admitting: Oncology

## 2022-12-13 ENCOUNTER — Encounter: Payer: Self-pay | Admitting: Oncology

## 2022-12-13 DIAGNOSIS — D539 Nutritional anemia, unspecified: Secondary | ICD-10-CM

## 2022-12-17 DIAGNOSIS — J449 Chronic obstructive pulmonary disease, unspecified: Secondary | ICD-10-CM | POA: Diagnosis not present

## 2022-12-17 DIAGNOSIS — J9610 Chronic respiratory failure, unspecified whether with hypoxia or hypercapnia: Secondary | ICD-10-CM | POA: Diagnosis not present

## 2022-12-19 ENCOUNTER — Other Ambulatory Visit: Payer: Self-pay

## 2022-12-19 ENCOUNTER — Telehealth: Payer: Self-pay

## 2022-12-19 MED ORDER — OXYCODONE-ACETAMINOPHEN 5-325 MG PO TABS: 1.0000 | ORAL_TABLET | Freq: Four times a day (QID) | ORAL | 0 refills | Status: DC | PRN

## 2022-12-19 NOTE — Progress Notes (Unsigned)
Care Management & Coordination Services Pharmacy Team  Reason for Encounter: Diabetes  Contacted patient to discuss diabetes disease state. {US HC Outreach:28874}  Current antihyperglycemic regimen:  ***   Patient verbally confirms he is taking the above medications as directed. {yes/no:20286}  What diet changes have been made to improve diabetes control?  What recent interventions/DTPs have been made to improve glycemic control:  ***  Have there been any recent hospitalizations or ED visits since last visit with PharmD? {yes/no:20286}  Patient {reports/denies:24182} hypoglycemic symptoms, including {Hypoglycemic Symptoms:3049003}  Patient {reports/denies:24182} hyperglycemic symptoms, including {symptoms; hyperglycemia:17903}  How often are you checking your blood sugar? {BG Testing frequency:23922}  What are your blood sugars ranging?  Fasting: *** Before meals: *** After meals: *** Bedtime: ***  During the week, how often does your blood glucose drop below 70? {LowBGfrequency:24142}  Are you checking your feet daily/regularly? {yes/no:20286}  Adherence Review: Is the patient currently on a STATIN medication? Yes Is the patient currently on ACE/ARB medication? Yes Does the patient have >5 day gap between last estimated fill dates? No   Chart Updates: Recent office visits:  10-26-2022 [name], CMA. Abnormal UA.   09-29-2022 [name], MD. B12= 221. WBC= 12.9, MCH= 25.7, Neutrophils Absolute= 8.6. Glucose= 197. A1C= 7.5. Trig= 292, HDL= 24, VLDL= 44. Methylmalonic Acid= 452. TSH= 4.950. Vit D= 17.6. STOP breo ellipta.    09-22-2022 [name], LPN. Covid booster vaccine given.   08-30-2022 [name], MD. FINISHED keflex. START levofloxacin 500 mg daily and prednisone 20 mg follow instructions.   Recent consult visits:  11-02-2022 [name], NP (Pulmonology). START Daliresp 500 mcg Take 0.5 tablets (250 mcg total) by mouth daily for 28  days, THEN 1 tablet (500 mcg total) daily.     10-26-2022 Brand Males, MD (Pulmonology). Pulmonary function test.   08-17-2022 [name], [name], NP (Pulmonary). Glucose= 239. WBC= 11.4, Hemo= 12.9, HCT= 38.8, RDW= 16.1. STOP Breo ellipta and spiriva. START brovana 2 mls by nebulization twice daily, pulmicort Take 2 mLs (0.5 mg total) by nebulization in the morning and at bedtime and Yupelri 175 mcg by nebulization daily. Pulmonary function and CT Chest Wo Contrast ordered.Flu vaccine given.    Hospital visits:  Medication Reconciliation was completed by comparing discharge summary, patient's EMR and Pharmacy list, and upon discussion with patient.   Admitted to the hospital on 11-09-2022 due to Ventricular ectopy. Discharge date was 11-10-2022. Discharged from [place]?Medications Started at [facility] Discharge:?? Losartan 50 mg daily   Medication Changes at Hospital Discharge: Metoprolol 50 mg twice daily TO 100 mg daily   Medications Discontinued at Hospital Discharge: None   Medications that remain the same after Hospital Discharge:??  -All other medications will remain the same.   Medications: Outpatient Encounter Medications as of 12/19/2022  Medication Sig   ALPRAZolam (XANAX) 0.25 MG tablet TAKE 1 TABLET BY MOUTH ONCE DAILY AS NEEDED FOR ANXIETY   arformoterol (BROVANA) 15 MCG/2ML NEBU Take 2 mLs (15 mcg total) by nebulization 2 (two) times daily.   budesonide (PULMICORT) 0.25 MG/2ML nebulizer solution Take 2 mLs (0.25 mg total) by nebulization in the morning and at bedtime.   clopidogrel (PLAVIX) 75 MG tablet Take 1 tablet by mouth once daily (Patient taking differently: Take 75 mg by mouth daily.)   cyanocobalamin (VITAMIN B12) 1000 MCG/ML injection INJECT 1 ML (CC) INTRAMUSCULARLY ONCE A WEEK   cyclobenzaprine (FLEXERIL) 10 MG tablet Take 1 tablet (10 mg total)  by mouth See admin instructions. Take 1 tablet by mouth three times daily as needed for  muscle spasm   DULoxetine (CYMBALTA) 60 MG capsule Take 1 capsule by mouth twice daily (Patient taking differently: Take 60 mg by mouth 2 (two) times daily.)   fenofibrate 160 MG tablet Take 1 tablet (160 mg total) by mouth daily.   finasteride (PROSCAR) 5 MG tablet Take 1 tablet by mouth once daily (Patient taking differently: Take 5 mg by mouth daily.)   gabapentin (NEURONTIN) 400 MG capsule TAKE 1 CAPSULE BY MOUTH THREE TIMES DAILY   glucose blood (ONETOUCH ULTRA) test strip USE 1 STRIP TO CHECK GLUCOSE IN THE MORNING AND 1 AT BEDTIME AS DIRECTED   ipratropium-albuterol (DUONEB) 0.5-2.5 (3) MG/3ML SOLN USE 1 AMPULE IN NEBULIZER 4 TIMES DAILY (Patient taking differently: Take 3 mLs by nebulization See admin instructions. USE 1 AMPULE IN NEBULIZER 4 TIMES DAILY)   Lancets (ONETOUCH DELICA PLUS [id number]) MISC USE 1  TO CHECK GLUCOSE THREE TIMES DAILY (Patient taking differently: 1 each by Other route See admin instructions. USE 1 TO CHECK GLUCOSE THREE TIMES DAILY)   levothyroxine (SYNTHROID) 25 MCG tablet Take 1 tablet by mouth once daily   losartan (COZAAR) 50 MG tablet Take 1 tablet (50 mg total) by mouth daily.   metFORMIN (GLUCOPHAGE) 1000 MG tablet Take 1 tablet (1,000 mg total) by mouth 2 (two) times daily with a meal.   metoprolol succinate (TOPROL-XL) 100 MG 24 hr tablet Take 1 tablet (100 mg total) by mouth daily. Take with or immediately following a meal.   MYRBETRIQ 25 MG TB24 tablet Take 1 tablet by mouth once daily (Patient taking differently: Take 25 mg by mouth daily.)   nitroGLYCERIN (NITROSTAT) 0.4 MG SL tablet DISSOLVE ONE TABLET UNDER THE TONGUE EVERY 5 TO 10 MINUTES PRIOR TO ACTIVITIES WHICH MIGHT PRECIPITATE AN ATTACK (Patient taking differently: Place 0.4 mg under the tongue every 5 (five) minutes as needed for chest pain.)   nystatin cream (MYCOSTATIN) Apply 1 application topically 2 (two) times daily as needed for dry skin.   Omega-3 Fatty Acids (FISH OIL) 1000 MG CAPS Take 2  capsules (2,000 mg total) by mouth in the morning and at bedtime.   omeprazole (PRILOSEC) 20 MG capsule Take 1 capsule by mouth once daily (Patient taking differently: Take 20 mg by mouth daily.)   oxyCODONE-acetaminophen (PERCOCET/ROXICET) 5-325 MG tablet Take 1 tablet by mouth every 6 (six) hours as needed for severe pain.   revefenacin (YUPELRI) 175 MCG/3ML nebulizer solution Take 3 mLs (175 mcg total) by nebulization daily.   roflumilast (DALIRESP) 500 MCG TABS tablet Take 0.5 tablets (250 mcg total) by mouth daily for 28 days, THEN 1 tablet (500 mcg total) daily.   rosuvastatin (CRESTOR) 40 MG tablet Take 1 tablet by mouth once daily (Patient taking differently: Take 40 mg by mouth daily.)   tamsulosin (FLOMAX) 0.4 MG CAPS capsule TAKE 2 CAPSULES BY MOUTH ONCE DAILY AFTER SUPPER (Patient taking differently: Take 0.8 mg by mouth daily after supper.)   TOUJEO SOLOSTAR 300 UNIT/ML Solostar Pen INJECT 30 UNITS SUBCUTANEOUSLY ONCE DAILY (Patient taking differently: Inject 30 Units into the skin See admin instructions. INJECT 30 UNITS SUBCUTANEOUSLY ONCE DAILY)   traZODone (DESYREL) 150 MG tablet Take 1 tablet by mouth once daily (Patient taking differently: Take 150 mg by mouth daily.)   VENTOLIN HFA 108 (90 Base) MCG/ACT inhaler INHALE 2 PUFFS BY MOUTH EVERY 6 HOURS AS NEEDED FOR SHORTNESS OF BREATH FOR WHEEZING (  Patient taking differently: Inhale 2 puffs into the lungs See admin instructions. INHALE 2 PUFFS BY MOUTH EVERY 6 HOURS AS NEEDED FOR SHORTNESS OF BREATH FOR WHEEZING)   Vitamin D, Ergocalciferol, (DRISDOL) 1.25 MG (50000 UNIT) CAPS capsule Take 1 capsule (50,000 Units total) by mouth 2 (two) times a week.   No facility-administered encounter medications on file as of 12/19/2022.    Recent Relevant Labs: Lab Results  Component Value Date/Time   HGBA1C 7.5 (H) 09/29/2022 12:39 PM   HGBA1C 7.9 (H) 05/19/2022 08:23 AM   MICROALBUR 30 05/23/2021 09:39 AM   MICROALBUR 80 08/23/2020 02:39 PM     Kidney Function Lab Results  Component Value Date/Time   CREATININE 0.93 11/10/2022 02:00 PM   CREATININE 0.95 11/09/2022 05:14 PM   GFR 74.13 08/17/2022 10:15 AM   GFRNONAA >60 11/10/2022 02:00 PM   GFRAA 100 12/16/2020 09:13 AM   12-19-2022: 1st attempt left VM 12-20-2022: 2nd attempt left VM  Star Rating Drugs:  Rosuvastatin 40 mg- Last filled 10-26-2022 90 DS. Previous 07-31-2022 90 DS Metformin 1000 mg- Last filled 10-01-2022 90 DS. Previous 07-03-2022 90 DS   Care Gaps: Annual wellness visit in last year? {yes/no:20286} Last eye exam / retinopathy screening: Last diabetic foot exam:   [place] Pharmacist Assistant [phone]

## 2022-12-20 ENCOUNTER — Other Ambulatory Visit: Payer: Self-pay | Admitting: Family Medicine

## 2022-12-20 ENCOUNTER — Encounter: Payer: Self-pay | Admitting: Cardiology

## 2022-12-20 MED ORDER — OXYCODONE-ACETAMINOPHEN 5-325 MG PO TABS: 1.0000 | ORAL_TABLET | Freq: Four times a day (QID) | ORAL | 0 refills | Status: DC | PRN

## 2022-12-21 NOTE — Progress Notes (Signed)
Cardiology Office Note:    Date:  12/22/2022   ID:  Don [name], DOB [birth year], MRN [id number]  PCP:  [name], MD  Cardiologist:  Shirlee More, MD    Referring MD: [name], MD    ASSESSMENT:    1. Frequent PVCs   2. RBBB (right bundle branch block with left anterior fascicular block)   3. LV dysfunction   4. Coronary artery disease involving native coronary artery of native heart with angina pectoris ([place])   5. Essential hypertension   6. Mixed hyperlipidemia   7. Hypomagnesemia   8. Orthopnea    PLAN:    In order of problems listed above:  His ventricular ectopy is new in the setting of CAD previous infarction chronic occlusion right coronary artery reduced ejection fraction most recently 45 to 50%, appears to be an element of heart failure.  I advised they agreed to be seen by EP for consideration of antiarrhythmic drug therapy continue his beta-blocker and will place him on magnesium supplementation Stable EKG pattern bifascicular heart block Stable CAD having no anginal discomfort continue his medical therapy including clopidogrel his beta-blocker and nonstatin lipid-lowering therapy and fenofibrate and fish oil Systolic blood pressure is elevated should respond to initiating diuretic   Next appointment: 3 months with me   Medication Adjustments/Labs and Tests Ordered: Current medicines are reviewed at length with the patient today.  Concerns regarding medicines are outlined above.  No orders of the defined types were placed in this encounter.  No orders of the defined types were placed in this encounter.   Chief Complaint  Patient presents with   Frequent PVC's    History of Present Illness:    Don [name] is a 72 y.o. male with a hx of coronary artery disease hypertension hyperlipidemia panlobular emphysema type 2 diabetes with diabetic polyneuropathy last seen Dr. Geraldo Pitter 05/19/2022.  Patient has a notation has a history of previous  inferior wall myocardial infarction coronary angiography in 2012 showed chronic occlusion of the right coronary artery at site of previous stent with good collaterals 40% LAD stenosis inferior hypokinesia and ejection fraction 45%.  Another heart catheterization [place] 02/20/2020.  Report is cut-and-paste to the chart DIAGNOSTIC FINDINGS:    1.  There is no change in comparison to the coronary angiogram findings  from 2012.  2.  There is a chronic total occlusion of the right coronary artery and  site of a previously deployed stent.  3.  There is mild, nonobstructive CAD otherwise.   4.  Moderately reduced LV systolic function, EF [id number].  5.  Normal LVEDP, 10 mmHg.  6.  Inferior hypokinesis with global hypokinesis otherwise.  coronary artery   I had seen him 12/15/2018 my records relate that he has a history of cardiomyopathy and coronary artery disease with previous mild reduced ejection fraction and no findings of congestive heart failure his echocardiogram at that time showed EF in the range of 45 to 50%.  He was seen [facility] health emergency department 11/09/2022 with chest pain and shortness of breath pulse that felt irregular and was found to have frequent PVCs and right bundle branch block.  An extensive evaluation including lab work with potassium 4.0 GFR greater than 60 cc magnesium was depleted at 1.6 a BNP level was minimally elevated and not in heart failure range 216 I have his CBC showed a leukocytosis 10,900 but a normal hemoglobin 13.6 his high-sensitivity troponin was normal his EKG showed sinus  rhythm right bundle branch block left anterior hemiblock and multiple PVCs he had a repeat troponin performed without delta the case was discussed with cardiology on-call [facility] and decision was made for the patient to be discharged home.  He had an echocardiogram performed in the ED showing mildly diminished ejection fraction 45 to 50% indeterminate diastolic  function the right ventricle is normal in size and function and no significant valvular abnormality was seen.  He had an event monitor reported 12/17/2022 showing frequent ventricular ectopy isolated PVCs 17.4% couplets rare less than 1% triplets  less than 1% episodes of ventricular bigeminy and trigeminy.  Compliance with diet, lifestyle and medications: Yes  This is a very complex visit His wife is present participates and feels in many of the blanks His ED visit was not prompted by cardiac complaints except his blood pressure was severely elevated they both say he has no awareness of his heart beating Overall he has done well she is concerned that he is quite short of breath at night when supine was unaware he has 1+ edema I do think there is an element of heart failure he was started on a low-dose diuretic for 2 weeks we will recheck his renal function and proBNP level and unguinal place him on maintenance magnesium with his previous depleted level My concern is his frequent ventricular ectopy LV dysfunction findings suggestive of heart failure whether this is arrhythmia induced and whether he benefit from antiarrhythmic drug therapy like amiodarone and then will see Dr. Curt Bears for EP consultation He is not having chest pain exertional shortness of breath palpitation or syncope Past Medical History:  Diagnosis Date   Absence of bladder continence 01/08/2022   Acute bilateral low back pain without sciatica 01/08/2022   Anxiety state 02/02/2022   Arthritis    Asymptomatic LV dysfunction 10/18/2017   B12 deficiency anemia 08/25/2021   Basal cell carcinoma    Benign prostatic hyperplasia with incomplete bladder emptying 01/14/2019   Blood in ear canal, left 05/13/2021   Bone pain 03/08/2020   Cancer ([place])    throat - 1997, throat - 2018   Cardiomyopathy, secondary ([place]) 12/01/2016   Overview:  EF 47% 12/26/16   Chest pain syndrome 11/02/2017   Chronic coronary artery disease 10/18/2017    Chronic cough 05/16/2022   Chronic ischemic heart disease 11/13/1998   Jan 22, 2003 Entered By: Marland Kitchen J Comment:  massive Mi in 2000 per patient hsitory, 2nd Mi in 2001Mar 11, 2004 Entered By: Marland Kitchen J Comment: Had stent placedin 2000,cath/angio in 2001   Chronic neck pain 05/16/2022   Chronic respiratory failure with hypoxia ([place]) 05/16/2022   CKD (chronic kidney disease) stage 3, GFR 30-59 ml/min ([place]) 07/25/2019   COPD (chronic obstructive pulmonary disease) ([place])    Coronary artery disease    Coronary artery disease involving native coronary artery of native heart with angina pectoris ([place]) 12/01/2016   Overview:  He has hx of IWMI in remote past, last cath in 2012 at [facility] showed chronic total occlusion of previously stented RCA, with good collaterals, a 40% LAD stenosis, inferior hypokinesis and EF 45%.    He's been lost to Cardiology f/u since 2015, but has not had any recurrent events   Deficiency anemia 08/25/2021   Depression    Diabetes mellitus ([place]) 03/08/2020   Diabetic polyneuropathy associated with type 2 diabetes mellitus ([place]) 05/16/2022   Diaphoresis 05/16/2022   Diverticulitis 05/02/2020   Dyslipidemia 12/01/2016   Emphysema lung ([place])  Erectile dysfunction due to diseases classified elsewhere 06/12/2019   Essential hypertension 10/18/2017   GERD (gastroesophageal reflux disease)    Hoarseness 11/15/2016   Hyperlipidemia    Hypertension    Insomnia 01/28/2021   Iron deficiency anemia due to chronic blood loss 08/25/2021   Laceration of thumb, left 12/17/2015   Left flank pain 03/14/2022   Left lower quadrant abdominal tenderness without rebound tenderness 03/14/2022   Lesion of vocal cord    Leukoplakia of larynx 11/15/2016   Long-term use of aspirin therapy 10/02/2018   Malfunction of penile prosthesis ([place]) 01/14/2019   Malignant neoplasm of skin 01/28/2021   Formatting of this note might be different from the original. Jan 22, 2003 Entered By: Marland Kitchen J  Comment: of skin - removed x2 inpast Jan 22, 2003 Entered By: Marland Kitchen J Comment: of skin - removed x2 inpast   Melanoma of back ([place])    melanoma on back   Memory loss 03/08/2020   Mood disorder in conditions classified elsewhere 02/02/2022   Myalgia 03/08/2020   Myocardial infarction [facility]) 2000   2 stents   Obstructive chronic bronchitis with exacerbation ([place]) 05/16/2022   Oropharyngeal dysphagia 08/12/2018   Other ill-defined and unknown causes of morbidity and mortality 11/13/1993   Formatting of this note might be different from the original. Jan 31, 2008 Entered By: ARMOUR,ROSS B Comment: lumbar, 8/08 Jan 22, 2003 Entered By: Marland Kitchen J Comment: x18yr in work place  quit [id number] in 2000 Jan 22, 2003 Entered By: MAMarland Kitchen Comment: x2530yrn work place  quit [id number] in 2000 Jan 31, 2008 Entered By: ARMOStephannie Petersomment: lumbar, 8/08   PAD (peripheral artery disease) (HCC)[place]/04/2017   Peripheral vascular disease (HCC)[place] iliac artery clot   Peyronie's disease 06/12/2019   Pharyngoesophageal dysphagia 08/12/2018   Pneumonia 02/2015   Pneumothorax 06/07/2020   Preoperative clearance 04/03/2018   Squamous cell carcinoma of larynx (HCC)[place]/31/2018   Testicular pain, left 01/14/2019   Throat cancer (HCC)[place] Tobacco use disorder 06/12/2019    Past Surgical History:  Procedure Laterality Date   ABDOMINAL ANGIOGRAM N/A 01/13/2015   Procedure: ABDOMINAL ANGIOGRAM;  Surgeon: ToddRosetta Posner;  [facility];  Service: Cardiovascular;  Laterality: N/A;   APPENDECTOMY     BACK SURGERY     CARDIAC CATHETERIZATION  2000/2012   with stents in [place] bilateral   ESOPHAGOGASTRODUODENOSCOPY     KNEE SURGERY Left    MICROLARYNGOSCOPY WITH CO2 LASER AND EXCISION OF VOCAL CORD LESION N/A 11/23/2016   Procedure: MICROLARYNGOSCOPY  AND EXCISION OF VOCAL CORD LESION;  Surgeon: JohnMelissa Montane;  [facility] [place]  [place];  Laterality: N/A;   MICROLARYNGOSCOPY WITH CO2 LASER AND EXCISION OF VOCAL CORD LESION N/A 01/11/2017   Procedure: MICROLARYNGOSCOPY WITH CO2 LASER AND EXCISION OF VOCAL CORD LESION;  Surgeon: JohnMelissa Montane;  [facility] [place] [place];  Laterality: N/A;   MICROLARYNGOSCOPY WITH LASER N/A 03/16/2015   Procedure: MICROLARYNGOSCOPY ;  Surgeon: JohnMelissa Montane;  [facility]  Service: E[facility];  Laterality: N/A;   peyronie's surgery     SHOULDER SURGERY Right    rotator cuff   SPINE SURGERY  09/2020   cervical surgery. steel plate, bone spurs, allograft.   THROAT SURGERY  1997   cancer removed    Current Medications: Current  Meds  Medication Sig   ALPRAZolam (XANAX) 0.25 MG tablet TAKE 1 TABLET BY MOUTH ONCE DAILY AS NEEDED FOR ANXIETY   arformoterol (BROVANA) 15 MCG/2ML NEBU Take 2 mLs (15 mcg total) by nebulization 2 (two) times daily.   budesonide (PULMICORT) 0.25 MG/2ML nebulizer solution Take 2 mLs (0.25 mg total) by nebulization in the morning and at bedtime.   clopidogrel (PLAVIX) 75 MG tablet Take 1 tablet by mouth once daily (Patient taking differently: Take 75 mg by mouth daily.)   cyanocobalamin (VITAMIN B12) 1000 MCG/ML injection INJECT 1 ML (CC) INTRAMUSCULARLY ONCE A WEEK   cyclobenzaprine (FLEXERIL) 10 MG tablet Take 1 tablet (10 mg total) by mouth See admin instructions. Take 1 tablet by mouth three times daily as needed for muscle spasm   DULoxetine (CYMBALTA) 60 MG capsule Take 1 capsule by mouth twice daily (Patient taking differently: Take 60 mg by mouth 2 (two) times daily.)   fenofibrate 160 MG tablet Take 1 tablet (160 mg total) by mouth daily.   finasteride (PROSCAR) 5 MG tablet Take 1 tablet by mouth once daily (Patient taking differently: Take 5 mg by mouth daily.)   gabapentin (NEURONTIN) 400 MG capsule TAKE 1 CAPSULE BY MOUTH THREE TIMES DAILY   glucose blood (ONETOUCH ULTRA) test strip USE 1 STRIP TO CHECK GLUCOSE IN THE MORNING AND 1 AT BEDTIME AS DIRECTED    ipratropium-albuterol (DUONEB) 0.5-2.5 (3) MG/3ML SOLN USE 1 AMPULE IN NEBULIZER 4 TIMES DAILY (Patient taking differently: Take 3 mLs by nebulization See admin instructions. USE 1 AMPULE IN NEBULIZER 4 TIMES DAILY)   Lancets (ONETOUCH DELICA PLUS 123XX123) MISC USE 1  TO CHECK GLUCOSE THREE TIMES DAILY (Patient taking differently: 1 each by Other route See admin instructions. USE 1 TO CHECK GLUCOSE THREE TIMES DAILY)   levothyroxine (SYNTHROID) 25 MCG tablet Take 1 tablet by mouth once daily   losartan (COZAAR) 50 MG tablet Take 1 tablet (50 mg total) by mouth daily.   metFORMIN (GLUCOPHAGE) 1000 MG tablet Take 1 tablet (1,000 mg total) by mouth 2 (two) times daily with a meal.   metoprolol succinate (TOPROL-XL) 100 MG 24 hr tablet Take 1 tablet (100 mg total) by mouth daily. Take with or immediately following a meal.   MYRBETRIQ 25 MG TB24 tablet Take 1 tablet by mouth once daily (Patient taking differently: Take 25 mg by mouth daily.)   nitroGLYCERIN (NITROSTAT) 0.4 MG SL tablet DISSOLVE ONE TABLET UNDER THE TONGUE EVERY 5 TO 10 MINUTES PRIOR TO ACTIVITIES WHICH MIGHT PRECIPITATE AN ATTACK (Patient taking differently: Place 0.4 mg under the tongue every 5 (five) minutes as needed for chest pain.)   nystatin cream (MYCOSTATIN) Apply 1 application topically 2 (two) times daily as needed for dry skin.   Omega-3 Fatty Acids (FISH OIL) 1000 MG CAPS Take 2 capsules (2,000 mg total) by mouth in the morning and at bedtime.   omeprazole (PRILOSEC) 20 MG capsule Take 1 capsule by mouth once daily (Patient taking differently: Take 20 mg by mouth daily.)   oxyCODONE-acetaminophen (PERCOCET/ROXICET) 5-325 MG tablet Take 1 tablet by mouth every 6 (six) hours as needed for severe pain.   revefenacin (YUPELRI) 175 MCG/3ML nebulizer solution Take 3 mLs (175 mcg total) by nebulization daily.   roflumilast (DALIRESP) 500 MCG TABS tablet Take 0.5 tablets (250 mcg total) by mouth daily for 28 days, THEN 1 tablet (500  mcg total) daily.   rosuvastatin (CRESTOR) 40 MG tablet Take 1 tablet by mouth once daily (Patient taking  differently: Take 40 mg by mouth daily.)   tamsulosin (FLOMAX) 0.4 MG CAPS capsule TAKE 2 CAPSULES BY MOUTH ONCE DAILY AFTER SUPPER (Patient taking differently: Take 0.8 mg by mouth daily after supper.)   TOUJEO SOLOSTAR 300 UNIT/ML Solostar Pen INJECT 30 UNITS SUBCUTANEOUSLY ONCE DAILY (Patient taking differently: Inject 30 Units into the skin See admin instructions. INJECT 30 UNITS SUBCUTANEOUSLY ONCE DAILY)   traZODone (DESYREL) 150 MG tablet Take 1 tablet by mouth once daily (Patient taking differently: Take 150 mg by mouth daily.)   VENTOLIN HFA 108 (90 Base) MCG/ACT inhaler INHALE 2 PUFFS BY MOUTH EVERY 6 HOURS AS NEEDED FOR SHORTNESS OF BREATH FOR WHEEZING (Patient taking differently: Inhale 2 puffs into the lungs See admin instructions. INHALE 2 PUFFS BY MOUTH EVERY 6 HOURS AS NEEDED FOR SHORTNESS OF BREATH FOR WHEEZING)   Vitamin D, Ergocalciferol, (DRISDOL) 1.25 MG (50000 UNIT) CAPS capsule Take 1 capsule (50,000 Units total) by mouth 2 (two) times a week.     Allergies:   Penicillins, Doxycycline, Iodinated contrast media, Penicillin g, and Tramadol   Social History   Socioeconomic History   Marital status: Married    Spouse name: Not on file   Number of children: 4   Years of education: Not on file   Highest education level: GED or equivalent  Occupational History   Occupation: retired  Tobacco Use   Smoking status: Every Day    Packs/day: 1.00    Years: 50.00    Total pack years: 50.00    Types: Cigarettes   Smokeless tobacco: Never   Tobacco comments:    down to .5 ppd  11/02/2022 hfb  Vaping Use   Vaping Use: Former  Substance and Sexual Activity   Alcohol use: No    Alcohol/week: 0.0 standard drinks of alcohol   Drug use: No   Sexual activity: Not on file  Other Topics Concern   Not on file  Social History Narrative   Lives with wife       One level       Right hand   Social Determinants of Health   Financial Resource Strain: Low Risk  (07/19/2022)   Overall Financial Resource Strain (CARDIA)    Difficulty of Paying Living Expenses: Not hard at all  Food Insecurity: Not on file  Transportation Needs: No Transportation Needs (07/19/2022)   PRAPARE - Hydrologist (Medical): No    Lack of Transportation (Non-Medical): No  Physical Activity: Not on file  Stress: Not on file  Social Connections: Not on file     Family History: The patient's family history includes Cancer in his mother and another family member; Diabetes in an other family member; Healthy in his child; Hypertension in his father and mother; Stroke in his father. ROS:   Please see the history of present illness.    All other systems reviewed and are negative.  EKGs/Labs/Other Studies Reviewed:    The following studies were reviewed today:  EKG:  EKG ordered today and personally reviewed.  The ekg ordered today demonstrates sinus rhythm right bundle branch block frequent ventricular ectopy trigeminy  Recent Labs: 08/17/2022: Pro B Natriuretic peptide (BNP) 34.0 09/29/2022: ALT 12 11/09/2022: B Natriuretic Peptide 216.5; Hemoglobin 13.6; Platelets 270; TSH 3.430 11/10/2022: BUN 12; Creatinine, Ser 0.93; Magnesium 1.9; Potassium 4.1; Sodium 136  Recent Lipid Panel    Component Value Date/Time   CHOL 103 09/29/2022 1239   TRIG 292 (H) 09/29/2022 1239  HDL 24 (L) 09/29/2022 1239   CHOLHDL 4.3 09/29/2022 1239   LDLCALC 35 09/29/2022 1239    Physical Exam:    VS:  BP (!) 154/64 (BP Location: Left Arm, Patient Position: Sitting)   Pulse 87   Ht 5' 8"$  (1.727 m)   Wt 199 lb 6.4 oz (90.4 kg)   SpO2 94%   BMI 30.32 kg/m     Wt Readings from Last 3 Encounters:  12/22/22 199 lb 6.4 oz (90.4 kg)  11/09/22 198 lb 6.6 oz (90 kg)  11/02/22 198 lb 6.4 oz (90 kg)     GEN:  Well nourished, well developed in no acute distress HEENT:  Normal NECK: No JVD; No carotid bruits LYMPHATICS: No lymphadenopathy CARDIAC: Frequent PVCs RRR, no murmurs, rubs, gallops RESPIRATORY:  Clear to auscultation without rales, wheezing or rhonchi  ABDOMEN: Soft, non-tender, non-distended MUSCULOSKELETAL: 1+ bilateral lower extremity pitting edema; No deformity  SKIN: Warm and dry NEUROLOGIC:  Alert and oriented x 3 PSYCHIATRIC:  Normal affect   Seen with Jerl Santos CMAchaperone  Signed, Shirlee More, MD  12/22/2022 11:44 AM    [facility]

## 2022-12-22 ENCOUNTER — Encounter: Payer: Self-pay | Admitting: Cardiology

## 2022-12-22 ENCOUNTER — Ambulatory Visit: Payer: Medicare Other | Attending: Cardiology | Admitting: Cardiology

## 2022-12-22 VITALS — BP 154/64 | HR 87 | Ht 68.0 in | Wt 199.4 lb

## 2022-12-22 DIAGNOSIS — I519 Heart disease, unspecified: Secondary | ICD-10-CM

## 2022-12-22 DIAGNOSIS — I452 Bifascicular block: Secondary | ICD-10-CM

## 2022-12-22 DIAGNOSIS — E782 Mixed hyperlipidemia: Secondary | ICD-10-CM

## 2022-12-22 DIAGNOSIS — I25119 Atherosclerotic heart disease of native coronary artery with unspecified angina pectoris: Secondary | ICD-10-CM | POA: Diagnosis not present

## 2022-12-22 DIAGNOSIS — I10 Essential (primary) hypertension: Secondary | ICD-10-CM

## 2022-12-22 DIAGNOSIS — R0601 Orthopnea: Secondary | ICD-10-CM

## 2022-12-22 DIAGNOSIS — I493 Ventricular premature depolarization: Secondary | ICD-10-CM | POA: Diagnosis not present

## 2022-12-22 MED ORDER — MAGNESIUM CHLORIDE 64 MG PO TBEC: 1.0000 | DELAYED_RELEASE_TABLET | Freq: Every day | ORAL | 3 refills | Status: DC

## 2022-12-22 MED ORDER — FUROSEMIDE 20 MG PO TABS: 20.0000 mg | ORAL_TABLET | Freq: Every day | ORAL | 3 refills | Status: DC

## 2022-12-22 NOTE — Patient Instructions (Signed)
Medication Instructions:  Your physician has recommended you make the following change in your medication:   START: Slo - Mag 1 tablet daily START: Furosemide 20 mg daily in the AM  *If you need a refill on your cardiac medications before your next appointment, please call your pharmacy*   Lab Work: Your physician recommends that you return for lab work in:   Labs in 2 weeks: BMP, Pro BNP  If you have labs (blood work) drawn today and your tests are completely normal, you will receive your results only by: [place] (if you have MyChart) OR A paper copy in the mail If you have any lab test that is abnormal or we need to change your treatment, we will call you to review the results.   Testing/Procedures: None   Follow-Up: At [facility], you and your health needs are our priority.  As part of our continuing mission to provide you with exceptional heart care, we have created designated Provider Care Teams.  These Care Teams include your primary Cardiologist (physician) and Advanced Practice Providers (APPs -  Physician Assistants and Nurse Practitioners) who all work together to provide you with the care you need, when you need it.  We recommend signing up for the patient portal called "MyChart".  Sign up information is provided on this After Visit Summary.  MyChart is used to connect with patients for Virtual Visits (Telemedicine).  Patients are able to view lab/test results, encounter notes, upcoming appointments, etc.  Non-urgent messages can be sent to your provider as well.   To learn more about what you can do with MyChart, go to [email or web address].    Your next appointment:   3 month(s)  Provider:   Shirlee More, MD    Other Instructions None  This visit was accompanied by Don [name].

## 2022-12-25 ENCOUNTER — Other Ambulatory Visit: Payer: Self-pay | Admitting: Family Medicine

## 2022-12-26 NOTE — Progress Notes (Shared)
[facility]  [address] [place],  [place]  [address] [phone]  Clinic Day:  12/26/22  Referring physician: [name], MD   CHIEF COMPLAINT:  CC: Leukocytosis  Current Treatment:  Surveillance   HISTORY OF PRESENT ILLNESS:  Don [name] is a 72 y.o. male with a history of leukocytosis.  He is aware of the white count being elevated for several months but he also has had 3 episodes of pneumonia and COPD.  On March 22nd, his white count was 14.6 with 66% neutrophils, 26% lymphocytes, 4% monocytes, and 2% eosinophils.  He has elevation of both the ANC and the absolute lymphocyte count.  Hemoglobin was 14.3 with an MCV of 90 and platelet count of a 190,000.  It was noted in the record that he has gradually had an increasing white blood count over the last 6 months.  I reviewed the hospital records and his white count was normal in 2017. On December 25th his white count was up to 18.4 and by February 5th it was 14.0.  He has not had corticosteroids since December.  However he does have cough productive of white sputum which is occasionally brown.  He denies any fevers or chills, but has had night sweats for a long time.  He is still on antibiotics now and notes that his dyspnea is worse, but denies hemoptysis or wheezing.  He does continue to smoke.  He has reported pulmonary nodules and so I reviewed his serial CT scans.  He tells me this was first found in [place] in 2002 but he denies living near the [place].  In April of 2017 he had multiple nodular opacities in the right lung up to 10 mm and follow-up was recommended.  He tells me he did have a PET scan many years ago which was negative.  A repeat CT of the chest in December of 2018 revealed stable right pulmonary lung nodules which remain similar and up to 10 mm.  He had repeat CT of the chest in April of 2019, which once again shows multiple nodules with occasional new ones and occasional  ones that appears slightly larger.  It was recommended that this be repeated in 3 months.  I reviewed these images with the patient and his wife at the time of my consultation in April of 2019. At that time he had a mild leukocytosis which was stable and had a normal differential, so I did not pursue further investigation.  He does have a history of a melanoma of the back in 2016 treated with wide excision.  He also has had multiple superficial cancers of the larynx treated with laser by the ENT surgeon.  He was seen again in June 2021, and was found to have low B12 and iron levels. B12 level was low at 199 in 2021 with a normal folate level.  His iron studies were consistent with iron deficiency, with a ferritin of 9.0, iron level of 42 with a TIBC of 408 for a saturation of 10.2. He was advised to start B12 injections and oral iron. MRI imaging of the cervical and lumbar spine from 2021 which shows significant degenerative changes including spinal stenosis. He underwent EGD in April 2022 with Dr. Melina Copa which revealed a normal exam.   INTERVAL HISTORY:  Don [name] is here for routine follow up.        He was found to be iron deficient in October with a saturation of 6%,  so he received IV iron at that time. B12 was also low at 146.   He was hospitalized after Thanksgiving with aspiration pneumonia which he related to the colonoscopy and EGD with Dr. Melina Copa.   His B12 level was rechecked and he was told he could stop B12 supplement.   He complains of abdominal pain which comes and goes.   He had a fall last week.   His hemoglobin came up from 11.4 to 12.9, and currently it is even better at 13.4. Other labs were unremarkable except for a blood sugar of 267.   His  appetite is good, and he has lost 2 pounds since his last visit.  He denies fever, chills or other signs of infection.  He denies nausea, vomiting, bowel issues, or abdominal pain.  He denies sore throat, cough, dyspnea, or chest  pain.  REVIEW OF SYSTEMS:  Review of Systems  Constitutional: Negative.  Negative for appetite change, chills, fatigue, fever and unexpected weight change.  HENT:  Negative.    Eyes: Negative.   Respiratory: Negative.  Negative for chest tightness, cough, hemoptysis, shortness of breath and wheezing.   Cardiovascular: Negative.  Negative for chest pain, leg swelling and palpitations.  Gastrointestinal:  Positive for abdominal pain (occasional). Negative for abdominal distention, blood in stool, constipation, diarrhea, nausea and vomiting.  Endocrine: Negative.   Genitourinary: Negative.  Negative for difficulty urinating, dysuria, frequency and hematuria.   Musculoskeletal: Negative.  Negative for arthralgias, back pain, flank pain, gait problem and myalgias.  Skin: Negative.   Neurological: Negative.  Negative for dizziness, extremity weakness, gait problem, headaches, light-headedness, numbness, seizures and speech difficulty.  Hematological: Negative.   Psychiatric/Behavioral: Negative.  Negative for depression and sleep disturbance. The patient is not nervous/anxious.   All other systems reviewed and are negative.    VITALS:  There were no vitals taken for this visit.  Wt Readings from Last 3 Encounters:  12/22/22 199 lb 6.4 oz (90.4 kg)  11/09/22 198 lb 6.6 oz (90 kg)  11/02/22 198 lb 6.4 oz (90 kg)    There is no height or weight on file to calculate BMI.  Performance status (ECOG): 2 - Symptomatic, <50% confined to bed  PHYSICAL EXAM:  Physical Exam Constitutional:      General: He is not in acute distress.    Appearance: Normal appearance. He is normal weight.  HENT:     Head: Normocephalic and atraumatic.  Eyes:     General: No scleral icterus.    Extraocular Movements: Extraocular movements intact.     Conjunctiva/sclera: Conjunctivae normal.     Pupils: Pupils are equal, round, and reactive to light.  Cardiovascular:     Rate and Rhythm: Normal rate and regular  rhythm.     Pulses: Normal pulses.     Heart sounds: Normal heart sounds. No murmur heard.    No friction rub. No gallop.  Pulmonary:     Effort: Pulmonary effort is normal. No respiratory distress.     Breath sounds: Normal breath sounds.  Abdominal:     General: Bowel sounds are normal. There is no distension.     Palpations: Abdomen is soft. There is no hepatomegaly, splenomegaly or mass.     Tenderness: There is abdominal tenderness (diffuse).  Musculoskeletal:        General: Normal range of motion.     Cervical back: Normal range of motion and neck supple.     Right lower leg: No edema.  Left lower leg: No edema.  Lymphadenopathy:     Cervical: No cervical adenopathy.  Skin:    General: Skin is warm and dry.  Neurological:     General: No focal deficit present.     Mental Status: He is alert and oriented to person, place, and time. Mental status is at baseline.  Psychiatric:        Mood and Affect: Mood normal.        Behavior: Behavior normal.        Thought Content: Thought content normal.        Judgment: Judgment normal.     LABS:      Latest Ref Rng & Units 11/09/2022    5:14 PM 09/29/2022   12:39 PM 08/17/2022   10:15 AM  CBC  WBC 4.0 - 10.5 K/uL 10.9  12.9  11.4   Hemoglobin 13.0 - 17.0 g/dL 13.6  13.1  12.9   Hematocrit 39.0 - 52.0 % 41.3  40.1  38.8   Platelets 150 - 400 K/uL 270  221  228.0       Latest Ref Rng & Units 11/10/2022    2:00 PM 11/09/2022    5:14 PM 09/29/2022   12:39 PM  CMP  Glucose 70 - 99 mg/dL 227  221  197   BUN 8 - 23 mg/dL 12  11  12   $ Creatinine 0.61 - 1.24 mg/dL 0.93  0.95  0.89   Sodium 135 - 145 mmol/L 136  138  141   Potassium 3.5 - 5.1 mmol/L 4.1  4.0  4.3   Chloride 98 - 111 mmol/L 104  103  99   CO2 22 - 32 mmol/L 25  26  27   $ Calcium 8.9 - 10.3 mg/dL 8.7  9.3  9.4   Total Protein 6.0 - 8.5 g/dL   6.9   Total Bilirubin 0.0 - 1.2 mg/dL   0.3   Alkaline Phos 44 - 121 IU/L   87   AST 0 - 40 IU/L   13   ALT 0 -  44 IU/L   12     Lab Results  Component Value Date   ALBUMINELP 3.2 03/18/2020   MSPIKE Not Observed 03/18/2020   Lab Results  Component Value Date   TIBC 446 08/25/2021   FERRITIN 7 (L) 08/25/2021   IRONPCTSAT 6 (L) 08/25/2021   No results found for: "LDH"  STUDIES:  LONG TERM MONITOR (3-14 DAYS)  Result Date: 12/17/2022 Patch Wear Time:  7 days and 5 hours (2024-01-10T11:42:06-499 to 2024-01-17T17:07:32-0500) Patient had a min HR of 66 bpm, max HR of 154 bpm, and avg HR of 80 bpm. Predominant underlying rhythm was Sinus Rhythm. Intermittent Bundle Branch Block was present. 2 triggered events both sinus rhythm with frequent PVCs and trigeminy. There were no episodes of sinus arrest pauses greater than 3 seconds or second or third-degree AV nodal block. Isolated SVEs were rare (<1.0%), and no SVE Couplets or SVE Triplets were present. 1 run of Supraventricular Tachycardia occurred lasting 6 beats with a max rate of 154 bpm (avg 145 bpm).  There were no episodes of atrial fibrillation or flutter. Isolated VEs were frequent (17.4%, [id number]), VE Couplets were rare (<1.0%, 635), and VE Triplets were rare (<1.0%, 236). Ventricular Bigeminy and Trigeminy were present.1 run of Ventricular premature contractions occurred lasting 4 beats with a max rate of 133 bpm (avg 109 bpm).      HISTORY:   Allergies:  Allergies  Allergen Reactions   Penicillins Rash and Hives    Has patient had a PCN reaction causing immediate rash, facial/tongue/throat swelling, SOB or lightheadedness with hypotension:unsure Has patient had a PCN reaction causing severe rash involving mucus membranes or skin necrosis:unsure Has patient had a PCN reaction that required hospitalization:No Has patient had a PCN reaction occurring within the last 10 years:No If all of the above answers are "NO", then may proceed with Cephalosporin use.  rash   Doxycycline Rash   Iodinated Contrast Media Rash    Also developed  blisters Also developed blisters   Penicillin G Rash   Tramadol Rash    Current Medications: Current Outpatient Medications  Medication Sig Dispense Refill   ALPRAZolam (XANAX) 0.25 MG tablet TAKE 1 TABLET BY MOUTH ONCE DAILY AS NEEDED FOR ANXIETY 30 tablet 2   arformoterol (BROVANA) 15 MCG/2ML NEBU Take 2 mLs (15 mcg total) by nebulization 2 (two) times daily. 120 mL 6   budesonide (PULMICORT) 0.25 MG/2ML nebulizer solution Take 2 mLs (0.25 mg total) by nebulization in the morning and at bedtime. 60 mL 11   clopidogrel (PLAVIX) 75 MG tablet Take 1 tablet by mouth once daily 90 tablet 0   cyanocobalamin (VITAMIN B12) 1000 MCG/ML injection INJECT 1 ML (CC) INTRAMUSCULARLY ONCE A WEEK 10 mL 0   cyclobenzaprine (FLEXERIL) 10 MG tablet Take 1 tablet (10 mg total) by mouth See admin instructions. Take 1 tablet by mouth three times daily as needed for muscle spasm 270 tablet 1   DULoxetine (CYMBALTA) 60 MG capsule Take 1 capsule by mouth twice daily (Patient taking differently: Take 60 mg by mouth 2 (two) times daily.) 180 capsule 1   fenofibrate 160 MG tablet Take 1 tablet (160 mg total) by mouth daily. 90 tablet 0   finasteride (PROSCAR) 5 MG tablet Take 1 tablet by mouth once daily (Patient taking differently: Take 5 mg by mouth daily.) 90 tablet 1   furosemide (LASIX) 20 MG tablet Take 1 tablet (20 mg total) by mouth daily. 90 tablet 3   gabapentin (NEURONTIN) 400 MG capsule TAKE 1 CAPSULE BY MOUTH THREE TIMES DAILY 270 capsule 0   glucose blood (ONETOUCH ULTRA) test strip USE 1 STRIP TO CHECK GLUCOSE IN THE MORNING AND 1 AT BEDTIME AS DIRECTED 100 each 3   ipratropium-albuterol (DUONEB) 0.5-2.5 (3) MG/3ML SOLN USE 1 AMPULE IN NEBULIZER 4 TIMES DAILY (Patient taking differently: Take 3 mLs by nebulization See admin instructions. USE 1 AMPULE IN NEBULIZER 4 TIMES DAILY) 360 mL 1   Lancets (ONETOUCH DELICA PLUS 123XX123) MISC USE 1  TO CHECK GLUCOSE THREE TIMES DAILY (Patient taking differently: 1  each by Other route See admin instructions. USE 1 TO CHECK GLUCOSE THREE TIMES DAILY) 100 each 0   levothyroxine (SYNTHROID) 25 MCG tablet Take 1 tablet by mouth once daily 90 tablet 0   losartan (COZAAR) 50 MG tablet Take 1 tablet (50 mg total) by mouth daily. 30 tablet 1   magnesium chloride (SLOW-MAG) 64 MG TBEC SR tablet Take 1 tablet (64 mg total) by mouth daily. 90 tablet 3   metFORMIN (GLUCOPHAGE) 1000 MG tablet Take 1 tablet (1,000 mg total) by mouth 2 (two) times daily with a meal. 180 tablet 2   metoprolol succinate (TOPROL-XL) 100 MG 24 hr tablet Take 1 tablet (100 mg total) by mouth daily. Take with or immediately following a meal. 90 tablet 3   MYRBETRIQ 25 MG TB24 tablet Take 1 tablet by mouth once daily (Patient  taking differently: Take 25 mg by mouth daily.) 30 tablet 0   nitroGLYCERIN (NITROSTAT) 0.4 MG SL tablet DISSOLVE ONE TABLET UNDER THE TONGUE EVERY 5 TO 10 MINUTES PRIOR TO ACTIVITIES WHICH MIGHT PRECIPITATE AN ATTACK (Patient taking differently: Place 0.4 mg under the tongue every 5 (five) minutes as needed for chest pain.) 25 tablet 0   nystatin cream (MYCOSTATIN) Apply 1 application topically 2 (two) times daily as needed for dry skin.     Omega-3 Fatty Acids (FISH OIL) 1000 MG CAPS Take 2 capsules (2,000 mg total) by mouth in the morning and at bedtime. 180 capsule 12   omeprazole (PRILOSEC) 20 MG capsule Take 1 capsule by mouth once daily (Patient taking differently: Take 20 mg by mouth daily.) 90 capsule 0   oxyCODONE-acetaminophen (PERCOCET/ROXICET) 5-325 MG tablet Take 1 tablet by mouth every 6 (six) hours as needed for severe pain. 120 tablet 0   revefenacin (YUPELRI) 175 MCG/3ML nebulizer solution Take 3 mLs (175 mcg total) by nebulization daily. 90 mL 11   roflumilast (DALIRESP) 500 MCG TABS tablet Take 0.5 tablets (250 mcg total) by mouth daily for 28 days, THEN 1 tablet (500 mcg total) daily. 30 tablet 11   rosuvastatin (CRESTOR) 40 MG tablet Take 1 tablet by mouth  once daily (Patient taking differently: Take 40 mg by mouth daily.) 90 tablet 1   tamsulosin (FLOMAX) 0.4 MG CAPS capsule TAKE 2 CAPSULES BY MOUTH ONCE DAILY AFTER SUPPER (Patient taking differently: Take 0.8 mg by mouth daily after supper.) 180 capsule 1   TOUJEO SOLOSTAR 300 UNIT/ML Solostar Pen INJECT 30 UNITS SUBCUTANEOUSLY ONCE DAILY (Patient taking differently: Inject 30 Units into the skin See admin instructions. INJECT 30 UNITS SUBCUTANEOUSLY ONCE DAILY) 6 mL 3   traZODone (DESYREL) 150 MG tablet Take 1 tablet by mouth once daily (Patient taking differently: Take 150 mg by mouth daily.) 90 tablet 0   VENTOLIN HFA 108 (90 Base) MCG/ACT inhaler INHALE 2 PUFFS BY MOUTH EVERY 6 HOURS AS NEEDED FOR SHORTNESS OF BREATH FOR WHEEZING (Patient taking differently: Inhale 2 puffs into the lungs See admin instructions. INHALE 2 PUFFS BY MOUTH EVERY 6 HOURS AS NEEDED FOR SHORTNESS OF BREATH FOR WHEEZING) 18 g 0   Vitamin D, Ergocalciferol, (DRISDOL) 1.25 MG (50000 UNIT) CAPS capsule Take 1 capsule (50,000 Units total) by mouth 2 (two) times a week. 10 capsule 2   No current facility-administered medications for this visit.     ASSESSMENT & PLAN:   Assessment:   1. History of melanoma of the back 5 years ago with no evidence of disease.  2. Multiple cancers of the larynx which have been superficial and treated with laser by the ENT surgeon.  Most recently back in 2018.    3. Multiple pulmonary nodules which have been present for years and are likely benign.  The largest is less than 10 mm.    4. Tobacco abuse.  He has advanced emphysema.  5.  Severe generalized bone pain of the upper back, neck, and right hip. He also complains of extremity weakness. Rheumatoid factor was negative and M-spike was negative for monoclonal spike and quantitative immunoglobulins are normal. Imaging has shown significant degenerative changes including spinal stenosis.  6.  Anemia, symptomatic. He did not respond to oral  iron supplement and so was given IV iron in October 2022. Colonoscopy with Dr. Melina Copa from December 2022 was negative for hemorrhage.  7.  Diffuse abdominal tenderness which is nonspecific. He can follow up with  his primary care physician if this persists.  8.  Prior B12 deficiency.  The injections have been stopped as his last level was okay.  Plan: We will bring him back in 4-6 months with CBC, CMP, iron studies and B12 for repeat evaluation.  I do advise that we follow up with him at least annually.  He understands and agrees to this plan of care.     I provided 20 minutes of face-to-face time during this this encounter and > 50% was spent counseling as documented under my assessment and plan.    Derwood Kaplan, MD [facility] [address] [place] [address] Dept: [phone] Dept Fax: [phone]    Ninetta Lights as a scribe for Derwood Kaplan, MD.,have documented all relevant documentation on the behalf of Derwood Kaplan, MD,as directed by  Derwood Kaplan, MD while in the presence of Derwood Kaplan, MD.

## 2022-12-28 ENCOUNTER — Inpatient Hospital Stay: Payer: Medicare Other

## 2022-12-28 ENCOUNTER — Inpatient Hospital Stay: Payer: Medicare Other | Admitting: Oncology

## 2023-01-01 ENCOUNTER — Other Ambulatory Visit: Payer: Self-pay | Admitting: Family Medicine

## 2023-01-01 ENCOUNTER — Ambulatory Visit: Payer: Medicare Other | Admitting: Cardiology

## 2023-01-03 ENCOUNTER — Ambulatory Visit: Payer: Medicare Other | Admitting: Family Medicine

## 2023-01-04 ENCOUNTER — Other Ambulatory Visit: Payer: Self-pay

## 2023-01-04 ENCOUNTER — Telehealth: Payer: Self-pay | Admitting: Internal Medicine

## 2023-01-04 ENCOUNTER — Ambulatory Visit: Payer: Medicare Other | Admitting: Internal Medicine

## 2023-01-04 ENCOUNTER — Encounter: Payer: Self-pay | Admitting: Internal Medicine

## 2023-01-04 VITALS — BP 116/64 | HR 90 | Temp 97.9°F | Ht 68.0 in | Wt 194.4 lb

## 2023-01-04 DIAGNOSIS — J449 Chronic obstructive pulmonary disease, unspecified: Secondary | ICD-10-CM | POA: Diagnosis not present

## 2023-01-04 DIAGNOSIS — J441 Chronic obstructive pulmonary disease with (acute) exacerbation: Secondary | ICD-10-CM

## 2023-01-04 LAB — CBC WITH DIFFERENTIAL/PLATELET
Basophils Absolute: 0 10*3/uL (ref 0.0–0.1)
Basophils Relative: 0.3 % (ref 0.0–3.0)
Eosinophils Absolute: 0.3 10*3/uL (ref 0.0–0.7)
Eosinophils Relative: 2.3 % (ref 0.0–5.0)
HCT: 36.5 % — ABNORMAL LOW (ref 39.0–52.0)
Hemoglobin: 12.1 g/dL — ABNORMAL LOW (ref 13.0–17.0)
Lymphocytes Relative: 26.6 % (ref 12.0–46.0)
Lymphs Abs: 3.1 10*3/uL (ref 0.7–4.0)
MCHC: 33.1 g/dL (ref 30.0–36.0)
MCV: 75.5 fl — ABNORMAL LOW (ref 78.0–100.0)
Monocytes Absolute: 1.1 10*3/uL — ABNORMAL HIGH (ref 0.1–1.0)
Monocytes Relative: 9.3 % (ref 3.0–12.0)
Neutro Abs: 7.3 10*3/uL (ref 1.4–7.7)
Neutrophils Relative %: 61.5 % (ref 43.0–77.0)
Platelets: 233 10*3/uL (ref 150.0–400.0)
RBC: 4.83 Mil/uL (ref 4.22–5.81)
RDW: 16.4 % — ABNORMAL HIGH (ref 11.5–15.5)
WBC: 11.8 10*3/uL — ABNORMAL HIGH (ref 4.0–10.5)

## 2023-01-04 MED ORDER — LOSARTAN POTASSIUM 50 MG PO TABS: 50.0000 mg | ORAL_TABLET | Freq: Every day | ORAL | 3 refills | Status: DC

## 2023-01-04 NOTE — Progress Notes (Signed)
IOV 04/05/2018  Chief Complaint  Patient presents with   Consult    Referred by Dr. [name] due to COPD. Pt was diagnosed in 2017 and states symptoms have become worse. Pt does have c/o SOB with exertion and also cough first thing in morning.    Don [name] 72 y.o. male from [place] [id number] -presents for new evaluation.  His wife and that is a good historian and gives most of the history.  History is gained from him and review of the old chart.  I also visualized the CT scan images from April 2019 and all the way back through April 2017 personally and agree with the final report  He is an ongoing smoker with a previous history of throat cancer and also chronic systolic heart failure status post cardiac stent for many many years with a stable ejection fraction of 35% being followed at [facility].  For the last 3 years he has had worsening shortness of breath.  Initially given a diagnosis of COPD not otherwise specified.  Wife says that he has had progressive shortness of breath since then.  His maintenance inhaler the Spiriva but he is also on Pulmicort nebulizers twice daily which is not mentioned in his med list.  For the last 3 years except for the year 2018 he has had admissions once a year for COPD exacerbation.  He also goes multiple rounds of prednisone and antibiotics for outpatient COPD exacerbation.  Most recent prednisone antibiotic was in December 2018.  They are frustrated with the repeated flareups.  Shortness of breath is associate with cough and wheezing.  This class III in exertion severity.  Walking from the bedroom to the porch makes him short of breath and relieved by rest there is no associated chest pain.  Also this past year he has had increasing unsteadiness of his lower extremities which is attributed to back issues and apparently he has been to neurology and spine surgery and a spine surgeon is having a plan for undergoing to our spinal surgery  and  [facility].  They are asking about preoperative respiratory assessment as well.     Walking desaturation test today.  We normally 285 feet x 3 laps on room air: He only did one lap.  He needed 2 people to walk with him.  He was extremely unsteady on his lower extremities.  He did not desaturate he just became tachycardic.  Pulse ox was held up in the high 90s   Chronic obstructive pulmonary disease, unspecified COPD type ([place])  - copd is stable at this point - continue o2 at night - continue spiriva daily - I recommend adding breo to the regimen scheduled daily - recommend using duoneb as needed  - recommend holding off pulmicort - recommend you talk to cadiologist and change lisinopril to another class; and see if cough improved - later at followu[p can discuss role of roflumilast in preventing flareups - ideally you also need pulmonary rehab but right now wobbly legs need to be addressed first  Preoperative respiratory examination - Moderate risk for any pulmonary complication such as pneumonia following surgery - Low moderate risk for prolonged ventilator dependence  - At time of surgery ideal is that he is flare up free for a month and has quit smoking for a month - this helps reduce risk further  Physical deconditioning  - this is a major issue more than copd causing you to be  fatigued and winded - only sorting out the back can improve this  Multiple lung nodules on CT - stable April 2017 - April 2019 - followup per throat cancer doctor I suppose   Followup  - 6-8 weeks to see response   OV 08/12/2018     HPI Don [name] 72 y.o. -presents for COPD follow-up.  Presents with his wife.  Since his last visit he has had shoulder and spine surgery successfully.  He continues to smoke however.  In the last 1 month wife is reporting deterioration in cough wheezing and dyspnea and chest tightness.  Sputum is changed from white in color to yellow-brown.   She is very frustrated by the deterioration.  She agrees that lisinopril can be contributing to cough because she herself suffered from that.  Nevertheless she is frustrated extremely.  She is asking if any medicines can prevent COPD exacerbations.  Review of the chart indicates April 2019 he did have CT scan of the chest which showed chronic stable nodules.  He has throat cancer.  He has some occasional dysphagia.  The wife is wondering if it is because of thick mucus.  Apparently his esophagus has been stretch.  ENT appointment is pending today.  CAT score is worse at 27.  He continues to smoke.   08/17/2022: OV with [name],NP for follow-up visit.  Last seen November 2020.  Underlying COPD.  Currently on Breo and Spiriva.  Having difficulty using his inhalers.  Does have some memory impairment.  Also feels like he cannot get enough of the medicine out of the inhalers.  Just feels like his breathing is gotten slowly worse over the last couple years.  Gets short of breath with minimal activities.  Lives a relatively sedentary lifestyle.  Does continue to smoke.  Discussed smoking cessation.  He is on 2 L of oxygen at bedtime.  Spirometry in the office showed FEV1 of 31% and ratio 42.  Formal PFTs ordered for further evaluation.  Changed to triple therapy nebulizer regimen to see if we can have better control over his symptoms.  Pulmonary nodules identified on previous CT scan.  Plan for repeat-ordered at visit.  11/02/2022: Today-follow-up Patient presents today for follow-up after undergoing pulmonary function testing.  He has severe obstructive defect with reversibility and severe diffusion defect.  He has been doing a little bit better with triple therapy regimen.  Feels like he is actually getting the medicine and it opens up his chest little bit.  Continues to have daily symptoms of dyspnea, which limit his activities.  He also continues to have daily symptoms of productive cough with white to brown  sputum.  This is baseline for him.  He has had multiple exacerbations over the past year requiring antibiotics and steroids.  His wife wants to know if we can put him on something to help prevent future flareups.  No increased chest congestion or wheezing.  No hemoptysis, weight loss or anorexia.  CT scan results are not available yet.  He does continue to smoke daily; down to 4 cigarettes a day.  He has tried multiple different things in the past to help him quit without any benefit.  OV 01/04/2023  Subjective:  Patient ID: Don [name], male , DOB: [birth year] , age 17 y.o. , MRN: [id number] , ADDRESS: [address] [place] [id number] PCP [name], MD Patient Care Team: [name], MD as PCP - General (Family Medicine) Revankar, Reita Cliche, MD as PCP -  Cardiology (Cardiology) Richardo Priest, MD as Referring Physician (Cardiology) Melissa Montane, MD as Consulting Physician (Otolaryngology) Lane Hacker, [facility] as Pharmacist (Pharmacist)  This Provider for this visit: Treatment Team:  Attending Provider: Brand Males, MD    01/04/2023 -   Chief Complaint  Patient presents with   Follow-up    Patient states he can't breathe. Patient coughing up yellow and white phlegm.    #Gold stage III COPD with nocturnal oxygen use #COPD frequent exacerbated.   HPI Don [name] 72 y.o. -returns for follow-up.  I personally not seen him in a few years.  He has been seeing nurse practitioner multiple times in 2023.  According to him and his wife.  Wife is acting as an independent historian he had multiple exacerbations at least 7 or 9 in 2023 is requiring prednisone and antibiotics.  They insist that Levaquin and prednisone each for 7 days is the only thing that works for him.  They will not accept any other antibiotic therapy.  For this routine visit there endorsing that he is feeling worse for the last 2 days they feel there is a flareup.  They feel there is an antibiotic and  prednisone indicated.  They specifically want Levaquin and prednisone.  Wife also believes that he might be aspirating and this might be a risk factor.  Review of the records indicate that he had high blood eosinophils in the past.  In the past he has been on Daliresp to prevent exacerbations this apparently worked well but it is expensive.  He never been on azithromycin.  Never been on Dupixent.    CT Chest data 10/31/22  Narrative & Impression  CLINICAL DATA:  COPD, dyspnea on exertion. History of throat cancer and diabetes.   EXAM: CT CHEST WITHOUT CONTRAST   TECHNIQUE: Multidetector CT imaging of the chest was performed following the standard protocol without IV contrast.   RADIATION DOSE REDUCTION: This exam was performed according to the departmental dose-optimization program which includes automated exposure control, adjustment of the mA and/or kV according to patient size and/or use of iterative reconstruction technique.   COMPARISON:  CT 04/30/2020.  Radiographs 08/17/2022.   FINDINGS: Cardiovascular: Atherosclerosis of the aorta, great vessels and coronary arteries. No acute vascular findings are seen on noncontrast imaging. The heart size is normal. There is stable anterior pericardial thickening versus a small amount of pericardial fluid.   Mediastinum/Nodes: There are no enlarged mediastinal, hilar or axillary lymph nodes.Hilar assessment is limited by the lack of intravenous contrast, although the hilar contours appear unchanged. The thyroid gland, trachea and esophagus demonstrate no significant findings.   Lungs/Pleura: No pleural effusion or pneumothorax. Moderate centrilobular and paraseptal emphysema with stable biapical scarring. Multiple predominately right-sided pulmonary nodules are again noted, including and densely calcified right lower lobe granuloma. The new right upper lobe nodule seen on the most recent CT is stable over the interval, measuring  4 mm on image 23/5. There is new mild clustered nodularity in the left lower lobe on axial images 70 through 76, likely postinflammatory or mucous impacted terminal bronchioles. No airspace disease.   Upper abdomen: No significant findings are seen within the visualized upper abdomen. There are scattered splenic granulomas. Mildly prominent ingested material in the stomach. Previous cholecystectomy.   Musculoskeletal/Chest wall: There is no chest wall mass or suspicious osseous finding. Mild spondylosis post lower cervical fusion.   IMPRESSION: 1. No acute chest findings identified. 2. New mild clustered nodularity in the left lower  lobe, likely postinflammatory or mucous impacted terminal bronchioles. No airspace disease or suspicious nodularity. 3. Additional scattered small pulmonary nodules are unchanged from priors, consistent with a benign etiology. 4. Sequela of prior granulomatous disease with calcified right lower lobe granuloma and scattered splenic granulomas. 5. Aortic Atherosclerosis (ICD10-I70.0) and Emphysema (ICD10-J43.9).     Electronically Signed   By: Richardean Sale M.D.   On: 11/03/2022 16:58        Latest Reference Range & Units 12/16/20 09:13 11/02/21 14:00 03/14/22 09:43 05/19/22 08:23 09/29/22 12:39  EOS (ABSOLUTE) 0.0 - 0.4 x10E3/uL 0.3 0.3 0.3 0.3 0.2     PFT     Latest Ref Rng & Units 10/26/2022   10:46 AM  PFT Results  FVC-Pre L 2.32   FVC-Predicted Pre % 58   FVC-Post L 2.84   FVC-Predicted Post % 71   Pre FEV1/FVC % % 48   Post FEV1/FCV % % 44   FEV1-Pre L 1.11   FEV1-Predicted Pre % 38   FEV1-Post L 1.24   DLCO uncorrected ml/min/mmHg 9.16   DLCO UNC% % 38   DLCO corrected ml/min/mmHg 9.59   DLCO COR %Predicted % 40   DLVA Predicted % 48        has a past medical history of Absence of bladder continence (01/08/2022), Acute bilateral low back pain without sciatica (01/08/2022), Anxiety state (02/02/2022), Arthritis,  Asymptomatic LV dysfunction (10/18/2017), B12 deficiency anemia (08/25/2021), Basal cell carcinoma, Benign prostatic hyperplasia with incomplete bladder emptying (01/14/2019), Blood in ear canal, left (05/13/2021), Bone pain (03/08/2020), Cancer ([place]), Cardiomyopathy, secondary ([place]) (12/01/2016), Chest pain syndrome (11/02/2017), Chronic coronary artery disease (10/18/2017), Chronic cough (05/16/2022), Chronic ischemic heart disease (11/13/1998), Chronic neck pain (05/16/2022), Chronic respiratory failure with hypoxia ([place]) (05/16/2022), CKD (chronic kidney disease) stage 3, GFR 30-59 ml/min (HCC) (07/25/2019), COPD (chronic obstructive pulmonary disease) ([place]), Coronary artery disease, Coronary artery disease involving native coronary artery of native heart with angina pectoris ([place]) (12/01/2016), Deficiency anemia (08/25/2021), Depression, Diabetes mellitus ([place]) (03/08/2020), Diabetic polyneuropathy associated with type 2 diabetes mellitus ([place]) (05/16/2022), Diaphoresis (05/16/2022), Diverticulitis (05/02/2020), Dyslipidemia (12/01/2016), Emphysema lung ([place]), Erectile dysfunction due to diseases classified elsewhere (06/12/2019), Essential hypertension (10/18/2017), GERD (gastroesophageal reflux disease), Hoarseness (11/15/2016), Hyperlipidemia, Hypertension, Insomnia (01/28/2021), Iron deficiency anemia due to chronic blood loss (08/25/2021), Laceration of thumb, left (12/17/2015), Left flank pain (03/14/2022), Left lower quadrant abdominal tenderness without rebound tenderness (03/14/2022), Lesion of vocal cord, 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), Long-term use of aspirin therapy (10/02/2018), Malfunction of penile prosthesis ([place]) (01/14/2019), Malignant neoplasm of skin (01/28/2021), Melanoma of back ([place]), Memory loss (03/08/2020), Mood disorder in conditions classified elsewhere (02/02/2022), Myalgia (03/08/2020), Myocardial infarction ([place]) (2000), Obstructive chronic bronchitis with exacerbation ([place]) (05/16/2022),  Oropharyngeal dysphagia (08/12/2018), Other ill-defined and unknown causes of morbidity and mortality (11/13/1993), PAD (peripheral artery disease) ([place]) (10/18/2017), Peripheral vascular disease ([place]), Peyronie's disease (06/12/2019), Pharyngoesophageal dysphagia (08/12/2018), Pneumonia (02/2015), Pneumothorax (06/07/2020), Preoperative clearance (04/03/2018), Squamous cell carcinoma of larynx ([place]) (12/13/2016), Testicular pain, left (01/14/2019), Throat cancer ([place]), and Tobacco use disorder (06/12/2019).   reports that he has been smoking cigarettes. He has a 50.00 pack-year smoking history. He has never used smokeless tobacco.  Past Surgical History:  Procedure Laterality Date   ABDOMINAL ANGIOGRAM N/A 01/13/2015   Procedure: ABDOMINAL ANGIOGRAM;  Surgeon: Rosetta Posner, MD;  Location: [facility] CATH LAB;  Service: Cardiovascular;  Laterality: N/A;   APPENDECTOMY     BACK SURGERY     CARDIAC CATHETERIZATION  2000/2012   with stents in 2000  CHOLECYSTECTOMY     COLONOSCOPY     ELBOW SURGERY     bilateral   ESOPHAGOGASTRODUODENOSCOPY     KNEE SURGERY Left    MICROLARYNGOSCOPY WITH CO2 LASER AND EXCISION OF VOCAL CORD LESION N/A 11/23/2016   Procedure: MICROLARYNGOSCOPY  AND EXCISION OF VOCAL CORD LESION;  Surgeon: Melissa Montane, MD;  Location: [place];  Service: ENT;  Laterality: N/A;   MICROLARYNGOSCOPY WITH CO2 LASER AND EXCISION OF VOCAL CORD LESION N/A 01/11/2017   Procedure: MICROLARYNGOSCOPY WITH CO2 LASER AND EXCISION OF VOCAL CORD LESION;  Surgeon: Melissa Montane, MD;  Location: [place];  Service: ENT;  Laterality: N/A;   MICROLARYNGOSCOPY WITH LASER N/A 03/16/2015   Procedure: MICROLARYNGOSCOPY ;  Surgeon: Melissa Montane, MD;  Location: [facility] OR;  Service: ENT;  Laterality: N/A;   peyronie's surgery     SHOULDER SURGERY Right    rotator cuff   SPINE SURGERY  09/2020   cervical surgery. steel plate, bone spurs, allograft.   THROAT SURGERY  1997   cancer removed    Allergies  Allergen Reactions    Penicillins Rash and Hives    Has patient had a PCN reaction causing immediate rash, facial/tongue/throat swelling, SOB or lightheadedness with hypotension:unsure Has patient had a PCN reaction causing severe rash involving mucus membranes or skin necrosis:unsure Has patient had a PCN reaction that required hospitalization:No Has patient had a PCN reaction occurring within the last 10 years:No If all of the above answers are "NO", then may proceed with Cephalosporin use.  rash   Doxycycline Rash   Iodinated Contrast Media Rash    Also developed blisters Also developed blisters   Penicillin G Rash   Tramadol Rash    Immunization History  Administered Date(s) Administered   COVID-19, mRNA, vaccine(Comirnaty)12 years and older 09/22/2022   Fluad Quad(high Dose 65+) 08/16/2020, 09/01/2021, 08/17/2022   H1N1 09/13/2008   Influenza Split 09/01/2021   Influenza, High Dose Seasonal PF 10/13/2017, 08/26/2019, 08/16/2020   Influenza-Unspecified 11/13/2002, 08/19/2004, 09/13/2004, 09/13/2006, 10/17/2007, 08/12/2008, 10/13/2017, 08/26/2019   Moderna SARS-COV2 Booster Vaccination 10/12/2020   Moderna Sars-Covid-2 Vaccination 10/12/2020   Pneumococcal Conjugate-13 03/13/2000, 10/01/2015   Pneumococcal Polysaccharide-23 11/13/2002, 08/13/2009   Pneumococcal-Unspecified 03/13/2000   Td 11/13/1996    Family History  Problem Relation Age of Onset   Cancer Mother        bone   Hypertension Mother    Stroke Father    Hypertension Father    Healthy Child    Cancer Other        brain   Diabetes Other      Current Outpatient Medications:    ALPRAZolam (XANAX) 0.25 MG tablet, TAKE 1 TABLET BY MOUTH ONCE DAILY AS NEEDED FOR ANXIETY, Disp: 30 tablet, Rfl: 2   arformoterol (BROVANA) 15 MCG/2ML NEBU, Take 2 mLs (15 mcg total) by nebulization 2 (two) times daily., Disp: 120 mL, Rfl: 6   budesonide (PULMICORT) 0.25 MG/2ML nebulizer solution, Take 2 mLs (0.25 mg total) by nebulization in the  morning and at bedtime., Disp: 60 mL, Rfl: 11   clopidogrel (PLAVIX) 75 MG tablet, Take 1 tablet by mouth once daily, Disp: 90 tablet, Rfl: 0   cyanocobalamin (VITAMIN B12) 1000 MCG/ML injection, INJECT 1 ML (CC) INTRAMUSCULARLY ONCE A WEEK, Disp: 10 mL, Rfl: 0   cyclobenzaprine (FLEXERIL) 10 MG tablet, Take 1 tablet (10 mg total) by mouth See admin instructions. Take 1 tablet by mouth three times daily as needed for muscle spasm, Disp: 270 tablet, Rfl: 1  DULoxetine (CYMBALTA) 60 MG capsule, Take 1 capsule by mouth twice daily (Patient taking differently: Take 60 mg by mouth 2 (two) times daily.), Disp: 180 capsule, Rfl: 1   fenofibrate 160 MG tablet, Take 1 tablet by mouth once daily, Disp: 90 tablet, Rfl: 0   finasteride (PROSCAR) 5 MG tablet, Take 1 tablet by mouth once daily (Patient taking differently: Take 5 mg by mouth daily.), Disp: 90 tablet, Rfl: 1   furosemide (LASIX) 20 MG tablet, Take 1 tablet (20 mg total) by mouth daily., Disp: 90 tablet, Rfl: 3   gabapentin (NEURONTIN) 400 MG capsule, TAKE 1 CAPSULE BY MOUTH THREE TIMES DAILY, Disp: 270 capsule, Rfl: 0   glucose blood (ONETOUCH ULTRA) test strip, USE 1 STRIP TO CHECK GLUCOSE IN THE MORNING AND 1 AT BEDTIME AS DIRECTED, Disp: 100 each, Rfl: 3   ipratropium-albuterol (DUONEB) 0.5-2.5 (3) MG/3ML SOLN, USE 1 AMPULE IN NEBULIZER 4 TIMES DAILY (Patient taking differently: Take 3 mLs by nebulization See admin instructions. USE 1 AMPULE IN NEBULIZER 4 TIMES DAILY), Disp: 360 mL, Rfl: 1   Lancets (ONETOUCH DELICA PLUS [id number]) MISC, USE 1  TO CHECK GLUCOSE THREE TIMES DAILY (Patient taking differently: 1 each by Other route See admin instructions. USE 1 TO CHECK GLUCOSE THREE TIMES DAILY), Disp: 100 each, Rfl: 0   levothyroxine (SYNTHROID) 25 MCG tablet, Take 1 tablet by mouth once daily, Disp: 90 tablet, Rfl: 0   magnesium chloride (SLOW-MAG) 64 MG TBEC SR tablet, Take 1 tablet (64 mg total) by mouth daily., Disp: 90 tablet, Rfl: 3    metFORMIN (GLUCOPHAGE) 1000 MG tablet, Take 1 tablet (1,000 mg total) by mouth 2 (two) times daily with a meal., Disp: 180 tablet, Rfl: 2   metoprolol succinate (TOPROL-XL) 100 MG 24 hr tablet, Take 1 tablet (100 mg total) by mouth daily. Take with or immediately following a meal., Disp: 90 tablet, Rfl: 3   MYRBETRIQ 25 MG TB24 tablet, Take 1 tablet by mouth once daily (Patient taking differently: Take 25 mg by mouth daily.), Disp: 30 tablet, Rfl: 0   nitroGLYCERIN (NITROSTAT) 0.4 MG SL tablet, DISSOLVE ONE TABLET UNDER THE TONGUE EVERY 5 TO 10 MINUTES PRIOR TO ACTIVITIES WHICH MIGHT PRECIPITATE AN ATTACK (Patient taking differently: Place 0.4 mg under the tongue every 5 (five) minutes as needed for chest pain.), Disp: 25 tablet, Rfl: 0   nystatin cream (MYCOSTATIN), Apply 1 application topically 2 (two) times daily as needed for dry skin., Disp: , Rfl:    Omega-3 Fatty Acids (FISH OIL) 1000 MG CAPS, Take 2 capsules (2,000 mg total) by mouth in the morning and at bedtime., Disp: 180 capsule, Rfl: 12   omeprazole (PRILOSEC) 20 MG capsule, Take 1 capsule by mouth once daily (Patient taking differently: Take 20 mg by mouth daily.), Disp: 90 capsule, Rfl: 0   oxyCODONE-acetaminophen (PERCOCET/ROXICET) 5-325 MG tablet, Take 1 tablet by mouth every 6 (six) hours as needed for severe pain., Disp: 120 tablet, Rfl: 0   revefenacin (YUPELRI) 175 MCG/3ML nebulizer solution, Take 3 mLs (175 mcg total) by nebulization daily., Disp: 90 mL, Rfl: 11   roflumilast (DALIRESP) 500 MCG TABS tablet, Take 0.5 tablets (250 mcg total) by mouth daily for 28 days, THEN 1 tablet (500 mcg total) daily., Disp: 30 tablet, Rfl: 11   rosuvastatin (CRESTOR) 40 MG tablet, Take 1 tablet by mouth once daily (Patient taking differently: Take 40 mg by mouth daily.), Disp: 90 tablet, Rfl: 1   tamsulosin (FLOMAX) 0.4 MG CAPS capsule,  TAKE 2 CAPSULES BY MOUTH ONCE DAILY AFTER SUPPER (Patient taking differently: Take 0.8 mg by mouth daily after  supper.), Disp: 180 capsule, Rfl: 1   TOUJEO SOLOSTAR 300 UNIT/ML Solostar Pen, INJECT 30 UNITS SUBCUTANEOUSLY ONCE DAILY (Patient taking differently: Inject 30 Units into the skin See admin instructions. INJECT 30 UNITS SUBCUTANEOUSLY ONCE DAILY), Disp: 6 mL, Rfl: 3   traZODone (DESYREL) 150 MG tablet, Take 1 tablet by mouth once daily (Patient taking differently: Take 150 mg by mouth daily.), Disp: 90 tablet, Rfl: 0   VENTOLIN HFA 108 (90 Base) MCG/ACT inhaler, INHALE 2 PUFFS BY MOUTH EVERY 6 HOURS AS NEEDED FOR SHORTNESS OF BREATH FOR WHEEZING (Patient taking differently: Inhale 2 puffs into the lungs See admin instructions. INHALE 2 PUFFS BY MOUTH EVERY 6 HOURS AS NEEDED FOR SHORTNESS OF BREATH FOR WHEEZING), Disp: 18 g, Rfl: 0   Vitamin D, Ergocalciferol, (DRISDOL) 1.25 MG (50000 UNIT) CAPS capsule, Take 1 capsule (50,000 Units total) by mouth 2 (two) times a week., Disp: 10 capsule, Rfl: 2   losartan (COZAAR) 50 MG tablet, Take 1 tablet (50 mg total) by mouth daily., Disp: 90 tablet, Rfl: 3      Objective:   Vitals:   01/04/23 1112  BP: 116/64  Pulse: 90  Temp: 97.9 F (36.6 C)  TempSrc: Oral  SpO2: 95%  Weight: 194 lb 6.4 oz (88.2 kg)  Height: 5' 8"$  (624THL m)    Estimated body mass index is 29.56 kg/m as calculated from the following:   Height as of this encounter: 5' 8"$  (1.727 m).   Weight as of this encounter: 194 lb 6.4 oz (88.2 kg).  @WEIGHTCHANGE$ @  Filed Weights   01/04/23 1112  Weight: 194 lb 6.4 oz (88.2 kg)     Physical Exam    General: No distress.  Looks chronically unwell and deconditioned. Neuro: Alert and Oriented x 3. GCS 15. Speech normal Psych: Pleasant Resp:  Barrel Chest - MIL.  Wheeze - NO, Crackles - NO, No overt respiratory distress CVS: Normal heart sounds. Murmurs - NO Ext: Stigmata of Connective Tissue Disease - NO HEENT: Normal upper airway. PEERL +. No post nasal drip        Assessment:       ICD-10-CM   1. COPD with acute  exacerbation ([place])  J44.1     2. Stage 3 severe COPD by GOLD classification ([place])  J44.9 CBC with Differential    Alpha-1 antitrypsin phenotype    Perennial allergen profile IgE    3. COPD, frequent exacerbations ([place])  J44.1          Plan:     Patient Instructions     ICD-10-CM   1. COPD with acute exacerbation ([place])  J44.1     2. Stage 3 severe COPD by GOLD classification ([place])  J44.9     3. COPD, frequent exacerbations ([place])  J44.1       - another flare up starting  - too bad dalirresp expensive  Plan  - check cbc with diff  - check alpha 1 AT  - check RAST allergy panel and IgE  -  take levaquin 553m once daily  X 7 days - Take prednisone 40 mg daily x 2 days, then 234mdaily x 2 days, then 1081maily x 2 days, then 5mg76mily x 2 days and stop    - Continue Budesonide 2 mL neb Twice daily. Brush tongue and rinse mouth afterwards  - Continue Brovana Neb Twice  daily  - Continue Yupelri Neb daily  Continue Albuterol inhaler 2 puffs or 3 mL neb every 6 hours as needed for shortness of breath or wheezing. Notify if symptoms persist despite rescue inhaler/neb use.  Work on not smoking  Continue on Oxygen 2l/m At bedtime    At some pint can consider clinical trials  Follow up   - with Dr. Chase Caller (1st) or Tammy [name] in 8  weeks or sooner if needed    SIGNATURE    Dr. Brand Males, M.D., F.C.C.P,  Pulmonary and Critical Care Medicine Staff Physician, [place] Director - Interstitial Lung Disease  Program  Pulmonary [place] [facility] [place], [place], [address]  Pager: [phone], If no answer or between  15:00h - 7:00h: call [phone] Telephone: [phone]  7:08 PM 01/04/2023

## 2023-01-04 NOTE — Telephone Encounter (Signed)
  COPD with frequent exacerbations blood eosinophils are elevated.  Please let the patient/his wife know.  Please initiate paperwork to start Dupixent   Latest Reference Range & Units 12/16/20 09:13 11/02/21 14:00 03/14/22 09:43 05/19/22 08:23 09/29/22 12:39  EOS (ABSOLUTE) 0.0 - 0.4 x10E3/uL 0.3 0.3 0.3 0.3 0.2    Latest Reference Range & Units 08/17/22 10:15 01/04/23 12:08  Eosinophils Absolute 0.0 - 0.7 K/uL 0.3 0.3

## 2023-01-04 NOTE — Patient Instructions (Addendum)
ICD-10-CM   1. COPD with acute exacerbation ([place])  J44.1     2. Stage 3 severe COPD by GOLD classification ([place])  J44.9     3. COPD, frequent exacerbations ([place])  J44.1       - another flare up starting  - too bad dalirresp expensive  Plan  - check cbc with diff  - check alpha 1 AT  - check RAST allergy panel and IgE  -  take levaquin 545m once daily  X 7 days - Take prednisone 40 mg daily x 2 days, then 233mdaily x 2 days, then 1025maily x 2 days, then 5mg67mily x 2 days and stop    - Continue Budesonide 2 mL neb Twice daily. Brush tongue and rinse mouth afterwards  - Continue Brovana Neb Twice daily  - Continue Yupelri Neb daily  Continue Albuterol inhaler 2 puffs or 3 mL neb every 6 hours as needed for shortness of breath or wheezing. Notify if symptoms persist despite rescue inhaler/neb use.  Work on not smoking  Continue on Oxygen 2l/m At bedtime    At some pint can consider clinical trials  Follow up   - with Dr. RamaChase Callert) or Tammy [name] in 8  weeks or sooner if needed

## 2023-01-05 ENCOUNTER — Encounter: Payer: Self-pay | Admitting: Internal Medicine

## 2023-01-05 MED ORDER — LEVOFLOXACIN 500 MG PO TABS: 500.0000 mg | ORAL_TABLET | Freq: Every day | ORAL | 0 refills | Status: DC

## 2023-01-05 MED ORDER — PREDNISONE 10 MG PO TABS: ORAL_TABLET | ORAL | 0 refills | Status: DC

## 2023-01-05 NOTE — Telephone Encounter (Signed)
Plan  - check cbc with diff  - check alpha 1 AT  - check RAST allergy panel and IgE   -  take levaquin '500mg'$  once daily  X 7 days - Take prednisone 40 mg daily x 2 days, then '20mg'$  daily x 2 days, then '10mg'$  daily x 2 days, then '5mg'$  daily x 2 days and stop   Checked pt's visit from 2/22 and saw that meds were not called in for pt. I have taken care of sending both the prednisone and abx to the pharmacy for pt. Called and spoke with pt's spouse letting her know this had been done and also let her know the info about the eosinophils and that MR wants to start pt on Dupixent and she verbalized understanding. Routing to pharmacy team.  Anne Ng, pt's spouse is requesting to have any information that they need to fill out sent to them due to the drive (whether it is emailed, etc).

## 2023-01-05 NOTE — Telephone Encounter (Signed)
Spoke with pt's spouse in a separate encounter letting her know meds had been sent in for pt. Nothing further needed.

## 2023-01-07 LAB — ALLERGEN PROFILE, PERENNIAL ALLERGEN IGE

## 2023-01-08 ENCOUNTER — Other Ambulatory Visit: Payer: Self-pay | Admitting: Family Medicine

## 2023-01-08 ENCOUNTER — Ambulatory Visit: Payer: Medicare Other | Admitting: Oncology

## 2023-01-08 ENCOUNTER — Other Ambulatory Visit: Payer: Medicare Other

## 2023-01-08 ENCOUNTER — Telehealth: Payer: Self-pay

## 2023-01-08 NOTE — Telephone Encounter (Signed)
[employer] Has called in w PA Approval for Dupixent   Approved eff today unitl 01/09/2024

## 2023-01-08 NOTE — Telephone Encounter (Signed)
Per Raquel Sarna, MR would like to start pt on Dupixent. No PAP has been completed at this time, however pt's wife had stated that they would be able to receive and sign forms via email if required.  Submitted a Prior Authorization request to [place] [facility]  for [place] via CoverMyMeds. Will update once we receive a response.  Key: [id number]

## 2023-01-08 NOTE — Progress Notes (Signed)
RAST panel normal Eos were high at 300. Rx meets indication for dupoxent. Emily pls inform and there is a separate phone note on dupixent

## 2023-01-08 NOTE — Progress Notes (Incomplete)
[facility]  [address] [place],  [place]  [address] [phone]   Clinic Day:  01/08/23    Referring physician: [name], MD    ASSESSMENT & PLAN:  Assessment & Plan: 1. History of melanoma of the back 5 years ago with no evidence of disease.   2. Multiple cancers of the larynx which have been superficial and treated with laser by the ENT surgeon.  Most recently back in 2018.     3. Multiple pulmonary nodules which have been present for years and are likely benign.  The largest is less than 10 mm.     4. Tobacco abuse.  He has advanced emphysema.   5.  Severe generalized bone pain of the upper back, neck, and right hip. He also complains of extremity weakness. Rheumatoid factor was negative and M-spike was negative for monoclonal spike and quantitative immunoglobulins are normal. Imaging has shown significant degenerative changes including spinal stenosis.   6.  Anemia, symptomatic. He did not respond to oral iron supplement and so was given IV iron in October 2022. Colonoscopy with Dr. Melina Copa from December 2022 was negative for hemorrhage.   7.  Diffuse abdominal tenderness which is nonspecific. He can follow up with his primary care physician if this persists.   8.  Prior B12 deficiency.  The injections have been stopped as his last level was okay.  Plan: We will bring him back in 4-6 months with CBC, CMP, iron studies and B12 for repeat evaluation.  I do advise that we follow up with him at least annually.  He understands and agrees to this plan of care   The patient understands the plans discussed today and is in agreement with them.  She knows to contact our office if she develops concerns prior to her next appointment.  I provided 10 minutes of face-to-face time during this this encounter and > 50% was spent counseling as documented under my assessment and plan.    Derwood Kaplan, MD [facility] [address] [place] [address] Dept: [phone] Dept Fax: [phone]   CHIEF COMPLAINT:  CC: Leukocytosis    Current Treatment: Surveillance   HISTORY OF PRESENT ILLNESS:  Don [name] is a 72 y.o. male with a history of leukocytosis.  He is aware of the white count being elevated for several months but he also has had 3 episodes of pneumonia and COPD.  On March 22nd, his white count was 14.6 with 66% neutrophils, 26% lymphocytes, 4% monocytes, and 2% eosinophils.  He has elevation of both the ANC and the absolute lymphocyte count.  Hemoglobin was 14.3 with an MCV of 90 and platelet count of a 190,000.  It was noted in the record that he has gradually had an increasing white blood count over the last 6 months.  I reviewed the hospital records and his white count was normal in 2017. On December 25th his white count was up to 18.4 and by February 5th it was 14.0.  He has not had corticosteroids since December.  However he does have cough productive of white sputum which is occasionally brown.  He denies any fevers or chills, but has had night sweats for a long time.  He is still on antibiotics now and notes that his dyspnea is worse, but denies hemoptysis or wheezing.  He does continue to smoke.  He has reported pulmonary nodules and so I  reviewed his serial CT scans.  He tells me this was first found in [place] in 2002 but he denies living near the [place].  In April of 2017 he had multiple nodular opacities in the right lung up to 10 mm and follow-up was recommended.  He tells me he did have a PET scan many years ago which was negative.  A repeat CT of the chest in December of 2018 revealed stable right pulmonary lung nodules which remain similar and up to 10 mm.  He had repeat CT of the chest in April of 2019, which once again shows multiple nodules with occasional new ones and occasional ones that appears slightly larger.  It was  recommended that this be repeated in 3 months.  I reviewed these images with the patient and his wife at the time of my consultation in April of 2019. At that time he had a mild leukocytosis which was stable and had a normal differential, so I did not pursue further investigation.  He does have a history of a melanoma of the back in 2016 treated with wide excision.  He also has had multiple superficial cancers of the larynx treated with laser by the ENT surgeon.   He was seen again in June 2021, and was found to have low B12 and iron levels. B12 level was low at 199 in 2021 with a normal folate level.  His iron studies were consistent with iron deficiency, with a ferritin of 9.0, iron level of 42 with a TIBC of 408 for a saturation of 10.2. He was advised to start B12 injections and oral iron. MRI imaging of the cervical and lumbar spine from 2021 which shows significant degenerative changes including spinal stenosis. He underwent EGD in April 2022 with Dr. Melina Copa which revealed a normal exam.   INTERVAL HISTORY:    [place] is here for routine follow up. He was found to be iron deficient in October with a saturation of 6%, so he received IV iron at that time. B12 was also low at 146. He was hospitalized after Thanksgiving with aspiration pneumonia which he related to the colonoscopy and EGD with Dr. Melina Copa. His B12 level was rechecked and he was told he could stop B12 supplement. He complains of abdominal pain which comes and goes. He had a fall last week. His hemoglobin came up from 11.4 to 12.9, and currently it is even better at 13.4. Other labs were unremarkable except for a blood sugar of 267. His  appetite is good, and he has lost 2 pounds since his last visit.  He denies fever, chills or other signs of infection.  He denies nausea, vomiting, bowel issues, or abdominal pain.  He denies sore throat, cough, dyspnea, or chest pain.   REVIEW OF SYMPTOMS:  Review of Systems  Constitutional: Negative.   Negative for chills, diaphoresis, fever, malaise/fatigue and weight loss.  HENT: Negative.  Negative for congestion, ear discharge, ear pain, hearing loss, nosebleeds, sinus pain, sore throat and tinnitus.   Eyes: Negative.  Negative for blurred vision, double vision, photophobia, pain, discharge and redness.  Respiratory: Negative.  Negative for cough, hemoptysis, sputum production, shortness of breath, wheezing and stridor.   Cardiovascular: Negative.  Negative for chest pain, palpitations, orthopnea, claudication, leg swelling and PND.  Gastrointestinal: Negative.  Negative for abdominal pain, blood in stool, constipation, diarrhea, heartburn, melena, nausea and vomiting.  Genitourinary: Negative.  Negative for dysuria, flank pain, frequency, hematuria and urgency.  Musculoskeletal: Negative.  Negative for back pain, falls, joint pain, myalgias and neck pain.  Skin: Negative.  Negative for itching and rash.  Neurological: Negative.  Negative for dizziness, tingling, tremors, sensory change, speech change, focal weakness, seizures, loss of consciousness, weakness and headaches.  Endo/Heme/Allergies: Negative.  Negative for environmental allergies and polydipsia. Does not bruise/bleed easily.  Psychiatric/Behavioral: Negative.  Negative for depression, hallucinations, memory loss, substance abuse and suicidal ideas. The patient is not nervous/anxious and does not have insomnia.    VITALS:      PHYSICAL EXAM:  Physical Exam Vitals and nursing note reviewed.  Constitutional:      General: He is not in acute distress.    Appearance: Normal appearance. He is normal weight. He is not ill-appearing, toxic-appearing or diaphoretic.  HENT:     Head: Normocephalic and atraumatic.     Right Ear: Tympanic membrane, ear canal and external ear normal. There is no impacted cerumen.     Left Ear: Tympanic membrane, ear canal and external ear normal. There is no impacted cerumen.     Nose: Nose normal.  No congestion or rhinorrhea.     Mouth/Throat:     Mouth: Mucous membranes are moist.     Pharynx: Oropharynx is clear. No oropharyngeal exudate or posterior oropharyngeal erythema.  Eyes:     General: No scleral icterus.       Right eye: No discharge.        Left eye: No discharge.     Extraocular Movements: Extraocular movements intact.     Conjunctiva/sclera: Conjunctivae normal.     Pupils: Pupils are equal, round, and reactive to light.  Neck:     Vascular: No carotid bruit.  Cardiovascular:     Rate and Rhythm: Normal rate and regular rhythm.     Pulses: Normal pulses.     Heart sounds: Normal heart sounds. No murmur heard.    No friction rub. No gallop.  Pulmonary:     Effort: Pulmonary effort is normal. No respiratory distress.     Breath sounds: Normal breath sounds. No stridor. No wheezing, rhonchi or rales.  Chest:     Chest wall: No tenderness.  Abdominal:     General: Bowel sounds are normal. There is no distension.     Palpations: Abdomen is soft. There is no hepatomegaly, splenomegaly or mass.     Tenderness: There is no abdominal tenderness. There is no right CVA tenderness, left CVA tenderness, guarding or rebound.     Hernia: No hernia is present.  Musculoskeletal:        General: No swelling, tenderness, deformity or signs of injury. Normal range of motion.     Cervical back: Normal range of motion and neck supple. No rigidity or tenderness.     Right lower leg: No edema.     Left lower leg: No edema.  Lymphadenopathy:     Cervical: No cervical adenopathy.     Upper Body:     Right upper body: No supraclavicular or axillary adenopathy.     Left upper body: No supraclavicular or axillary adenopathy.     Lower Body: No right inguinal adenopathy. No left inguinal adenopathy.  Skin:    General: Skin is warm and dry.     Coloration: Skin is not jaundiced or pale.     Findings: No bruising, erythema, lesion or rash.  Neurological:     Mental Status: He is  alert and oriented to person, place, and time. Mental status is at baseline.  Cranial Nerves: No cranial nerve deficit.     Sensory: No sensory deficit.     Motor: No weakness.     Coordination: Coordination normal.     Gait: Gait normal.     Deep Tendon Reflexes: Reflexes normal.  Psychiatric:        Mood and Affect: Mood normal.        Behavior: Behavior normal.        Thought Content: Thought content normal.        Judgment: Judgment normal.    HISTORY:   Allergies  Allergen Reactions   Penicillins Rash and Hives    Has patient had a PCN reaction causing immediate rash, facial/tongue/throat swelling, SOB or lightheadedness with hypotension:unsure Has patient had a PCN reaction causing severe rash involving mucus membranes or skin necrosis:unsure Has patient had a PCN reaction that required hospitalization:No Has patient had a PCN reaction occurring within the last 10 years:No If all of the above answers are "NO", then may proceed with Cephalosporin use.  rash   Doxycycline Rash   Iodinated Contrast Media Rash    Also developed blisters Also developed blisters   Penicillin G Rash   Tramadol Rash   Past Medical History:  Diagnosis Date   Absence of bladder continence 01/08/2022   Acute bilateral low back pain without sciatica 01/08/2022   Anxiety state 02/02/2022   Arthritis    Asymptomatic LV dysfunction 10/18/2017   B12 deficiency anemia 08/25/2021   Basal cell carcinoma    Benign prostatic hyperplasia with incomplete bladder emptying 01/14/2019   Blood in ear canal, left 05/13/2021   Bone pain 03/08/2020   Cancer ([place])    throat - 1997, throat - 2018   Cardiomyopathy, secondary ([place]) 12/01/2016   Overview:  EF 47% 12/26/16   Chest pain syndrome 11/02/2017   Chronic coronary artery disease 10/18/2017   Chronic cough 05/16/2022   Chronic ischemic heart disease 11/13/1998   Jan 22, 2003 Entered By: Marland Kitchen J Comment:  massive Mi in 2000 per patient hsitory, 2nd  Mi in 2001Mar 11, 2004 Entered By: Marland Kitchen J Comment: Had stent placedin 2000,cath/angio in 2001   Chronic neck pain 05/16/2022   Chronic respiratory failure with hypoxia ([place]) 05/16/2022   CKD (chronic kidney disease) stage 3, GFR 30-59 ml/min (HCC) 07/25/2019   COPD (chronic obstructive pulmonary disease) ([place])    Coronary artery disease    Coronary artery disease involving native coronary artery of native heart with angina pectoris ([place]) 12/01/2016   Overview:  He has hx of IWMI in remote past, last cath in 2012 at [facility] showed chronic total occlusion of previously stented RCA, with good collaterals, a 40% LAD stenosis, inferior hypokinesis and EF 45%.    He's been lost to Cardiology f/u since 2015, but has not had any recurrent events   Deficiency anemia 08/25/2021   Depression    Diabetes mellitus ([place]) 03/08/2020   Diabetic polyneuropathy associated with type 2 diabetes mellitus ([place]) 05/16/2022   Diaphoresis 05/16/2022   Diverticulitis 05/02/2020   Dyslipidemia 12/01/2016   Emphysema lung ([place])    Erectile dysfunction due to diseases classified elsewhere 06/12/2019   Essential hypertension 10/18/2017   GERD (gastroesophageal reflux disease)    Hoarseness 11/15/2016   Hyperlipidemia    Hypertension    Insomnia 01/28/2021   Iron deficiency anemia due to chronic blood loss 08/25/2021   Laceration of thumb, left 12/17/2015   Left flank pain 03/14/2022   Left lower quadrant abdominal tenderness without rebound tenderness  03/14/2022   Lesion of vocal cord    Leukoplakia of larynx 11/15/2016   Long-term use of aspirin therapy 10/02/2018   Malfunction of penile prosthesis ([place]) 01/14/2019   Malignant neoplasm of skin 01/28/2021   Formatting of this note might be different from the original. Jan 22, 2003 Entered By: Marland Kitchen J Comment: of skin - removed x2 inpast Jan 22, 2003 Entered By: Marland Kitchen J Comment: of skin - removed x2 inpast   Melanoma of back ([place])    melanoma on back    Memory loss 03/08/2020   Mood disorder in conditions classified elsewhere 02/02/2022   Myalgia 03/08/2020   Myocardial infarction [facility]) 2000   2 stents   Obstructive chronic bronchitis with exacerbation ([place]) 05/16/2022   Oropharyngeal dysphagia 08/12/2018   Other ill-defined and unknown causes of morbidity and mortality 11/13/1993   Formatting of this note might be different from the original. Jan 31, 2008 Entered By: ARMOUR,ROSS B Comment: lumbar, 8/08 Jan 22, 2003 Entered By: Marland Kitchen J Comment: x28yr in work place  quit 369yrago in 2000 Jan 22, 2003 Entered By: MAMarland Kitchen Comment: [id number] work place  quit [id number] in 2000 Jan 31, 2008 Entered By: ARMOStephannie Petersomment: lumbar, 8/08   PAD (peripheral artery disease) (HCC)[place]/04/2017   Peripheral vascular disease (HCC)[place] iliac artery clot   Peyronie's disease 06/12/2019   Pharyngoesophageal dysphagia 08/12/2018   Pneumonia 02/2015   Pneumothorax 06/07/2020   Preoperative clearance 04/03/2018   Squamous cell carcinoma of larynx (HCC)[place]/31/2018   Testicular pain, left 01/14/2019   Throat cancer (HCC)[place] Tobacco use disorder 06/12/2019   Past Surgical History:  Procedure Laterality Date   ABDOMINAL ANGIOGRAM N/A 01/13/2015   Procedure: ABDOMINAL ANGIOGRAM;  Surgeon: ToddRosetta Posner;  [facility];  Service: Cardiovascular;  Laterality: N/A;   APPENDECTOMY     BACK SURGERY     CARDIAC CATHETERIZATION  2000/2012   [place]   ESOPHAGOGASTRODUODENOSCOPY     KNEE SURGERY Left    MICROLARYNGOSCOPY WITH CO2 LASER AND EXCISION OF VOCAL CORD LESION N/A 11/23/2016   Procedure: MICROLARYNGOSCOPY  AND EXCISION OF VOCAL CORD LESION;  Surgeon: JohnMelissa Montane;  Location: [facility]: ENT;  Laterality: N/A;   MICROLARYNGOSCOPY WITH CO2 LASER AND EXCISION OF VOCAL CORD LESION N/A 01/11/2017   Procedure: MICROLARYNGOSCOPY WITH CO2 LASER AND EXCISION OF  VOCAL CORD LESION;  Surgeon: JohnMelissa Montane;  Location: [facility]: ENT;  Laterality: N/A;   MICROLARYNGOSCOPY WITH LASER N/A 03/16/2015   Procedure: MICROLARYNGOSCOPY ;  Surgeon: JohnMelissa Montane;  [facility]  Service: ENT;  Laterality: N/A;   peyronie's surgery     SHOULDER SURGERY Right    rotator cuff   SPINE SURGERY  09/2020   cervical surgery. steel plate, bone spurs, allograft.   THROAT SURGERY  1997   cancer removed   Family History  Problem Relation Age of Onset   Cancer Mother        bone   Hypertension Mother    Stroke Father    Hypertension Father    Healthy Child    Cancer Other        brain   Diabetes Other    Tobacco Use: High Risk (01/04/2023)   Patient History    Smoking Tobacco Use: Every  Day    Smokeless Tobacco Use: Never    Passive Exposure: Not on file   Social History   Substance and Sexual Activity  Alcohol Use No   Alcohol/week: 0.0 standard drinks of alcohol   Social History   Substance and Sexual Activity  Drug Use No   LABS:     STUDIES:  EXAM: 11/09/2022 PORTABLE CHEST 1 VIEW IMPRESSION: No acute cardiopulmonary disease.  EXAM: 10/31/2022 CT CHEST WITHOUT CONTRAST IMPRESSION: 1. No acute chest findings identified. 2. New mild clustered nodularity in the left lower lobe, likely postinflammatory or mucous impacted terminal bronchioles. No airspace disease or suspicious nodularity. 3. Additional scattered small pulmonary nodules are unchanged from priors, consistent with a benign etiology. 4. Sequela of prior granulomatous disease with calcified right lower lobe granuloma and scattered splenic granulomas. 5. Aortic Atherosclerosis (ICD10-I70.0) and Emphysema (ICD10-J43.9).  EXAM: 08/17/2022 CHEST - 2 VIEW IMPRESSION: No acute cardiopulmonary process.  I,Jasmine M Lassiter,acting as a scribe for Derwood Kaplan, MD.,have documented all relevant documentation on the behalf of Derwood Kaplan, MD,as directed by   Derwood Kaplan, MD while in the presence of Derwood Kaplan, MD.

## 2023-01-09 ENCOUNTER — Other Ambulatory Visit: Payer: Self-pay | Admitting: Family Medicine

## 2023-01-09 NOTE — Telephone Encounter (Signed)
Noted. Thank you. We have started Dupixent benefits investigation in new phone encounter.  Knox Saliva, PharmD, MPH, BCPS, CPP Clinical Pharmacist (Rheumatology and Pulmonology)

## 2023-01-10 ENCOUNTER — Other Ambulatory Visit (HOSPITAL_COMMUNITY): Payer: Self-pay

## 2023-01-10 ENCOUNTER — Encounter: Payer: Self-pay | Admitting: Oncology

## 2023-01-10 NOTE — Telephone Encounter (Signed)
Received notification from [facility] regarding a prior authorization for [place]. Authorization has been APPROVED from 01/08/2023 to 01/09/2024.    Per test claim, copay for 28 days supply is $1,1893.79  Patient can fill through [place]: [phone]   Authorization # [id number]   Will reach out to pt to discuss PAP process.

## 2023-01-11 ENCOUNTER — Ambulatory Visit: Payer: Medicare Other

## 2023-01-11 ENCOUNTER — Ambulatory Visit (INDEPENDENT_AMBULATORY_CARE_PROVIDER_SITE_OTHER): Payer: Medicare Other | Admitting: Family Medicine

## 2023-01-11 VITALS — BP 108/60 | HR 82 | Temp 97.2°F | Ht 68.0 in | Wt 196.0 lb

## 2023-01-11 DIAGNOSIS — F5101 Primary insomnia: Secondary | ICD-10-CM

## 2023-01-11 DIAGNOSIS — I10 Essential (primary) hypertension: Secondary | ICD-10-CM

## 2023-01-11 DIAGNOSIS — I700 Atherosclerosis of aorta: Secondary | ICD-10-CM | POA: Diagnosis not present

## 2023-01-11 DIAGNOSIS — Z23 Encounter for immunization: Secondary | ICD-10-CM | POA: Diagnosis not present

## 2023-01-11 DIAGNOSIS — I509 Heart failure, unspecified: Secondary | ICD-10-CM

## 2023-01-11 DIAGNOSIS — J018 Other acute sinusitis: Secondary | ICD-10-CM

## 2023-01-11 DIAGNOSIS — I110 Hypertensive heart disease with heart failure: Secondary | ICD-10-CM

## 2023-01-11 DIAGNOSIS — J9611 Chronic respiratory failure with hypoxia: Secondary | ICD-10-CM | POA: Diagnosis not present

## 2023-01-11 DIAGNOSIS — E782 Mixed hyperlipidemia: Secondary | ICD-10-CM

## 2023-01-11 DIAGNOSIS — E559 Vitamin D deficiency, unspecified: Secondary | ICD-10-CM

## 2023-01-11 DIAGNOSIS — Z0001 Encounter for general adult medical examination with abnormal findings: Secondary | ICD-10-CM

## 2023-01-11 DIAGNOSIS — E1142 Type 2 diabetes mellitus with diabetic polyneuropathy: Secondary | ICD-10-CM

## 2023-01-11 DIAGNOSIS — Z0000 Encounter for general adult medical examination without abnormal findings: Secondary | ICD-10-CM

## 2023-01-11 MED ORDER — SULFAMETHOXAZOLE-TRIMETHOPRIM 800-160 MG PO TABS: 1.0000 | ORAL_TABLET | Freq: Two times a day (BID) | ORAL | 0 refills | Status: DC

## 2023-01-11 NOTE — Patient Instructions (Addendum)
  Don [name] , Thank you for taking time to come for your Medicare Wellness Visit. I appreciate your ongoing commitment to your health goals. Please review the following plan we discussed and let me know if I can assist you in the future.   These are the goals we discussed: Increase exercise.  Quit smoking.  No smoking around oxygen!!   This is a list of the screening recommended for you and due dates:  Health Maintenance  Topic Date Due   Hepatitis C Screening: USPSTF Recommendation to screen - Ages 46-79 yo.  Never done   Zoster (Shingles) Vaccine (1 of 2) Never done   Eye exam for diabetics  01/11/2023*   COVID-19 Vaccine (3 - Mixed Product risk series) 01/27/2023*   Hemoglobin A1C  03/30/2023   Yearly kidney health urinalysis for diabetes  06/06/2023   Screening for Lung Cancer  11/01/2023   Yearly kidney function blood test for diabetes  11/11/2023   Complete foot exam   01/11/2024   Medicare Annual Wellness Visit  01/11/2024   Colon Cancer Screening  10/20/2031   Pneumonia Vaccine  Completed   Flu Shot  Completed   HPV Vaccine  Aged Out   DTaP/Tdap/Td vaccine  Discontinued  *Topic was postponed. The date shown is not the original due date.   Talk with pharmacist about shingrix vaccines  For sinus infection: Bactrim sent.   For diabetes: Sliding scale for insulin is:  Recommend increase lantus insulin    Check sugars twice a day before breakfast and supper.  120-140: lantus 6 U 141-150: lantus 10 U 151-170: lantus 16 U 170-200: lantus 20 U 200-250: lantus 30 U 251-300: lantus 40 U 301-350: lantus 50 U >350: 60 U   Malaky Tetrault MD

## 2023-01-11 NOTE — Assessment & Plan Note (Addendum)
Control: Fair Recommend check sugars fasting daily. Recommend check feet daily. Recommend annual eye exams. Medicines: metformin 1000 mg daily and change toujeo to  Sliding scale for insulin is: Recommend increase lantus insulin    Check sugars twice a day before breakfast and supper.  120-140: lantus 6 U 141-150: lantus 10 U 151-170: lantus 16 U 170-200: lantus 20 U 200-250: lantus 30 U 251-300: lantus 40 U 301-350: lantus 50 U >350: 60 U   Continue to work on eating a healthy diet and exercise.  Labs drawn today.

## 2023-01-11 NOTE — Assessment & Plan Note (Signed)
Decrease trazodone to 75 mg qhs

## 2023-01-11 NOTE — Progress Notes (Unsigned)
Subjective:   Don [name] is a 72 y.o. male who presents for Medicare Annual/Subsequent preventive examination.  Review of Systems    Review of Systems  Constitutional:  Negative for chills, fever and malaise/fatigue.  HENT:  Negative for ear pain, sinus pain and sore throat.   Respiratory:  Negative for cough and shortness of breath.   Cardiovascular:  Negative for chest pain.  Musculoskeletal:  Negative for myalgias.  Neurological:  Negative for headaches.    Physical Exam Constitutional:      Appearance: Normal appearance.  HENT:     Right Ear: Tympanic membrane normal.     Left Ear: Tympanic membrane normal.     Nose: No nasal tenderness.     Right Turbinates: Not pale.     Left Turbinates: Not pale.     Mouth/Throat:     Comments: Yellow drainage Cardiovascular:     Rate and Rhythm: Normal rate and regular rhythm.     Pulses: Normal pulses.     Heart sounds: Normal heart sounds. No murmur heard. Pulmonary:     Effort: Pulmonary effort is normal.     Breath sounds: Normal breath sounds.  Abdominal:     General: Bowel sounds are normal.     Palpations: Abdomen is soft.  Musculoskeletal:        General: Normal range of motion.  Neurological:     Mental Status: He is alert and oriented to person, place, and time.  Psychiatric:        Mood and Affect: Mood normal.        Behavior: Behavior normal.     Cardiac Risk Factors include: advanced age (>69mn, >>57women);diabetes mellitus;hypertension;smoking/ tobacco exposure  DM:  Checking sugars BID. 129-187.  Taking metformin 1000 mg once daily (was changed to once daily due to BID causing upset stomach) and toujeo daily (30 units if btwn 135-140).   Hypertension: Metoprolol tartrate 100 mg one daily, losartan 50 mg daily   Hyperlipidemia: Fish oil 2 capsules twice daily, rosuvastatin 40 mg before bed,    COPD: on yupelri, pulmicort, and albuterol nebs   Hypothyroidism: synthroid 25 mcg  daily  GERD:Omeprazole 20 mg daily     Objective:    Today's Vitals   01/11/23 1530  BP: 108/60  Pulse: 82  Temp: (!) 97.2 F (36.2 C)  SpO2: 99%  Weight: 196 lb (88.9 kg)  Height: '5\' 8"'$  (1.727 m)   Body mass index is 29.8 kg/m.     01/11/2023    3:32 PM 09/24/2019    2:21 PM 05/09/2019    2:54 PM 01/10/2017    8:19 AM 11/23/2016   11:07 AM 11/21/2016    3:32 PM 11/20/2016    9:38 AM  Advanced Directives  Does Patient Have a Medical Advance Directive? No No No No No No Yes;No  Type of Advance Directive     Living will  Living will  Would patient like information on creating a medical advance directive? Yes (ED - Information included in AVS) No - Patient declined  Yes (MAU/Ambulatory/Procedural Areas - Information given) No - Patient declined No - Patient declined     Current Medications (verified) Outpatient Encounter Medications as of 01/11/2023  Medication Sig   sulfamethoxazole-trimethoprim (BACTRIM DS) 800-160 MG tablet Take 1 tablet by mouth 2 (two) times daily.   ALPRAZolam (XANAX) 0.25 MG tablet TAKE 1 TABLET BY MOUTH ONCE DAILY AS NEEDED FOR ANXIETY   arformoterol (BROVANA) 15 MCG/2ML NEBU Take 2  mLs (15 mcg total) by nebulization 2 (two) times daily.   budesonide (PULMICORT) 0.25 MG/2ML nebulizer solution Take 2 mLs (0.25 mg total) by nebulization in the morning and at bedtime.   clopidogrel (PLAVIX) 75 MG tablet Take 1 tablet by mouth once daily   cyanocobalamin (VITAMIN B12) 1000 MCG/ML injection INJECT 1 ML (CC) INTRAMUSCULARLY ONCE A WEEK   cyclobenzaprine (FLEXERIL) 10 MG tablet Take 1 tablet (10 mg total) by mouth See admin instructions. Take 1 tablet by mouth three times daily as needed for muscle spasm   DULoxetine (CYMBALTA) 60 MG capsule Take 1 capsule by mouth twice daily (Patient taking differently: Take 60 mg by mouth 2 (two) times daily.)   fenofibrate 160 MG tablet Take 1 tablet by mouth once daily   finasteride (PROSCAR) 5 MG tablet Take 1 tablet by  mouth once daily (Patient taking differently: Take 5 mg by mouth daily.)   furosemide (LASIX) 20 MG tablet Take 1 tablet (20 mg total) by mouth daily.   gabapentin (NEURONTIN) 400 MG capsule TAKE 1 CAPSULE BY MOUTH THREE TIMES DAILY   glucose blood (ONETOUCH ULTRA) test strip USE 1 STRIP TO CHECK GLUCOSE IN THE MORNING AND 1 AT BEDTIME AS DIRECTED   ipratropium-albuterol (DUONEB) 0.5-2.5 (3) MG/3ML SOLN USE 1 AMPULE IN NEBULIZER 4 TIMES DAILY (Patient taking differently: Take 3 mLs by nebulization See admin instructions. USE 1 AMPULE IN NEBULIZER 4 TIMES DAILY)   Lancets (ONETOUCH DELICA PLUS 123XX123) MISC USE 1  TO CHECK GLUCOSE THREE TIMES DAILY (Patient taking differently: 1 each by Other route See admin instructions. USE 1 TO CHECK GLUCOSE THREE TIMES DAILY)   levothyroxine (SYNTHROID) 25 MCG tablet Take 1 tablet by mouth once daily   losartan (COZAAR) 50 MG tablet Take 1 tablet (50 mg total) by mouth daily.   magnesium chloride (SLOW-MAG) 64 MG TBEC SR tablet Take 1 tablet (64 mg total) by mouth daily.   metFORMIN (GLUCOPHAGE) 1000 MG tablet Take 1 tablet (1,000 mg total) by mouth 2 (two) times daily with a meal.   metoprolol succinate (TOPROL-XL) 100 MG 24 hr tablet Take 1 tablet (100 mg total) by mouth daily. Take with or immediately following a meal.   MYRBETRIQ 25 MG TB24 tablet Take 1 tablet by mouth once daily (Patient taking differently: Take 25 mg by mouth daily.)   nitroGLYCERIN (NITROSTAT) 0.4 MG SL tablet DISSOLVE ONE TABLET UNDER THE TONGUE EVERY 5 TO 10 MINUTES PRIOR TO ACTIVITIES WHICH MIGHT PRECIPITATE AN ATTACK (Patient taking differently: Place 0.4 mg under the tongue every 5 (five) minutes as needed for chest pain.)   nystatin cream (MYCOSTATIN) Apply 1 application topically 2 (two) times daily as needed for dry skin.   Omega-3 Fatty Acids (FISH OIL) 1000 MG CAPS Take 2 capsules (2,000 mg total) by mouth in the morning and at bedtime.   omeprazole (PRILOSEC) 20 MG capsule Take  1 capsule by mouth once daily (Patient taking differently: Take 20 mg by mouth daily.)   oxyCODONE-acetaminophen (PERCOCET/ROXICET) 5-325 MG tablet Take 1 tablet by mouth every 6 (six) hours as needed for severe pain.   revefenacin (YUPELRI) 175 MCG/3ML nebulizer solution Take 3 mLs (175 mcg total) by nebulization daily.   roflumilast (DALIRESP) 500 MCG TABS tablet Take 0.5 tablets (250 mcg total) by mouth daily for 28 days, THEN 1 tablet (500 mcg total) daily.   rosuvastatin (CRESTOR) 40 MG tablet Take 1 tablet by mouth once daily (Patient taking differently: Take 40 mg by mouth daily.)  tamsulosin (FLOMAX) 0.4 MG CAPS capsule TAKE 2 CAPSULES BY MOUTH ONCE DAILY AFTER SUPPER (Patient taking differently: Take 0.8 mg by mouth daily after supper.)   TOUJEO SOLOSTAR 300 UNIT/ML Solostar Pen INJECT 30 UNITS SUBCUTANEOUSLY ONCE DAILY   traZODone (DESYREL) 150 MG tablet Take 1 tablet (150 mg total) by mouth daily. (Patient taking differently: Take 75 mg by mouth at bedtime.)   VENTOLIN HFA 108 (90 Base) MCG/ACT inhaler INHALE 2 PUFFS BY MOUTH EVERY 6 HOURS AS NEEDED FOR SHORTNESS OF BREATH FOR WHEEZING (Patient taking differently: Inhale 2 puffs into the lungs See admin instructions. INHALE 2 PUFFS BY MOUTH EVERY 6 HOURS AS NEEDED FOR SHORTNESS OF BREATH FOR WHEEZING)   Vitamin D, Ergocalciferol, (DRISDOL) 1.25 MG (50000 UNIT) CAPS capsule TAKE 1 CAPSULE BY MOUTH TWICE A WEEK   [DISCONTINUED] levofloxacin (LEVAQUIN) 500 MG tablet Take 1 tablet (500 mg total) by mouth daily.   [DISCONTINUED] predniSONE (DELTASONE) 10 MG tablet Take 4 tablets (40 mg total) by mouth daily with breakfast for 2 days, THEN 2 tablets (20 mg total) daily with breakfast for 2 days, THEN 1 tablet (10 mg total) daily with breakfast for 2 days, THEN 0.5 tablets (5 mg total) daily with breakfast for 2 days.   No facility-administered encounter medications on file as of 01/11/2023.    Allergies (verified) Penicillins, Doxycycline,  Iodinated contrast media, Penicillin g, and Tramadol   History: Past Medical History:  Diagnosis Date   Absence of bladder continence 01/08/2022   Acute bilateral low back pain without sciatica 01/08/2022   Anxiety state 02/02/2022   Arthritis    Asymptomatic LV dysfunction 10/18/2017   B12 deficiency anemia 08/25/2021   Basal cell carcinoma    Benign prostatic hyperplasia with incomplete bladder emptying 01/14/2019   Blood in ear canal, left 05/13/2021   Bone pain 03/08/2020   Cancer ([place])    throat - 1997, throat - 2018   Cardiomyopathy, secondary ([place]) 12/01/2016   Overview:  EF 47% 12/26/16   Chest pain syndrome 11/02/2017   Chronic coronary artery disease 10/18/2017   Chronic cough 05/16/2022   Chronic ischemic heart disease 11/13/1998   Jan 22, 2003 Entered By: Marland Kitchen J Comment:  massive Mi in 2000 per patient hsitory, 2nd Mi in 2001Mar 11, 2004 Entered By: Marland Kitchen J Comment: Had stent placedin 2000,cath/angio in 2001   Chronic neck pain 05/16/2022   Chronic respiratory failure with hypoxia ([place]) 05/16/2022   CKD (chronic kidney disease) stage 3, GFR 30-59 ml/min (HCC) 07/25/2019   COPD (chronic obstructive pulmonary disease) ([place])    Coronary artery disease    Coronary artery disease involving native coronary artery of native heart with angina pectoris ([place]) 12/01/2016   Overview:  He has hx of IWMI in remote past, last cath in 2012 at [facility] showed chronic total occlusion of previously stented RCA, with good collaterals, a 40% LAD stenosis, inferior hypokinesis and EF 45%.    He's been lost to Cardiology f/u since 2015, but has not had any recurrent events   Deficiency anemia 08/25/2021   Depression    Diabetes mellitus ([place]) 03/08/2020   Diabetic polyneuropathy associated with type 2 diabetes mellitus ([place]) 05/16/2022   Diaphoresis 05/16/2022   Diverticulitis 05/02/2020   Dyslipidemia 12/01/2016   Emphysema lung ([place])    Erectile dysfunction due to diseases classified  elsewhere 06/12/2019   Essential hypertension 10/18/2017   GERD (gastroesophageal reflux disease)    Hoarseness 11/15/2016   Hyperlipidemia    Hypertension  Insomnia 01/28/2021   Iron deficiency anemia due to chronic blood loss 08/25/2021   Laceration of thumb, left 12/17/2015   Left flank pain 03/14/2022   Left lower quadrant abdominal tenderness without rebound tenderness 03/14/2022   Lesion of vocal cord    Leukoplakia of larynx 11/15/2016   Long-term use of aspirin therapy 10/02/2018   Malfunction of penile prosthesis ([place]) 01/14/2019   Malignant neoplasm of skin 01/28/2021   Formatting of this note might be different from the original. Jan 22, 2003 Entered By: Marland Kitchen J Comment: of skin - removed x2 inpast Jan 22, 2003 Entered By: Marland Kitchen J Comment: of skin - removed x2 inpast   Melanoma of back ([place])    melanoma on back   Memory loss 03/08/2020   Mood disorder in conditions classified elsewhere 02/02/2022   Myalgia 03/08/2020   Myocardial infarction [facility]) 2000   2 stents   Obstructive chronic bronchitis with exacerbation ([place]) 05/16/2022   Oropharyngeal dysphagia 08/12/2018   Other ill-defined and unknown causes of morbidity and mortality 11/13/1993   Formatting of this note might be different from the original. Jan 31, 2008 Entered By: ARMOUR,ROSS B Comment: lumbar, 8/08 Jan 22, 2003 Entered By: Marland Kitchen J Comment: x42yr in work place  quit 349yrago in 2000 Jan 22, 2003 Entered By: MAMarland Kitchen Comment: [id number] work place  quit [id number] in 2000 Jan 31, 2008 Entered By: ARMOStephannie Petersomment: lumbar, 8/08   PAD (peripheral artery disease) (HCC)[place]/04/2017   Peripheral vascular disease (HCC)[place] iliac artery clot   Peyronie's disease 06/12/2019   Pharyngoesophageal dysphagia 08/12/2018   Pneumonia 02/2015   Pneumothorax 06/07/2020   Preoperative clearance 04/03/2018   Squamous cell carcinoma of larynx (HCC)[place]/31/2018   Testicular pain, left 01/14/2019   Throat  cancer (HCC)[place] Tobacco use disorder 06/12/2019   Past Surgical History:  Procedure Laterality Date   ABDOMINAL ANGIOGRAM N/A 01/13/2015   Procedure: ABDOMINAL ANGIOGRAM;  Surgeon: ToddRosetta Posner;  [facility];  Service: Cardiovascular;  Laterality: N/A;   APPENDECTOMY     BACK SURGERY     CARDIAC CATHETERIZATION  2000/2012   with stents in [place] bilateral   ESOPHAGOGASTRODUODENOSCOPY     KNEE SURGERY Left    MICROLARYNGOSCOPY WITH CO2 LASER AND EXCISION OF VOCAL CORD LESION N/A 11/23/2016   Procedure: MICROLARYNGOSCOPY  AND EXCISION OF VOCAL CORD LESION;  Surgeon: JohnMelissa Montane;  [facility];  Laterality: N/A;   MICROLARYNGOSCOPY WITH CO2 LASER AND EXCISION OF VOCAL CORD LESION N/A 01/11/2017   Procedure: MICROLARYNGOSCOPY WITH CO2 LASER AND EXCISION OF VOCAL CORD LESION;  Surgeon: JohnMelissa Montane;  [facility] [place];  Laterality: N/A;   MICROLARYNGOSCOPY WITH LASER N/A 03/16/2015   Procedure: MICROLARYNGOSCOPY ;  Surgeon: JohnMelissa Montane;  [facility]  Service: ENT;  Laterality: N/A;   peyronie's surgery     SHOULDER SURGERY Right    rotator cuff   SPINE SURGERY  09/2020   cervical surgery. steel plate, bone spurs, allograft.   THROAT SURGERY  1997   cancer removed   Family History  Problem Relation Age of Onset   Cancer Mother        bone   Hypertension Mother    Stroke Father    Hypertension Father    Healthy Child  Cancer Other        brain   Diabetes Other    Social History   Socioeconomic History   Marital status: Married    Spouse name: Not on file   Number of children: 4   Years of education: Not on file   Highest education level: GED or equivalent  Occupational History   Occupation: retired  Tobacco Use   Smoking status: Every Day    Packs/day: 1.00    Years: 50.00    Total pack years: 50.00    Types: Cigarettes   Smokeless tobacco: Never   Tobacco  comments:    down to .5 ppd  11/02/2022 hfb  Vaping Use   Vaping Use: Former  Substance and Sexual Activity   Alcohol use: No    Alcohol/week: 0.0 standard drinks of alcohol   Drug use: No   Sexual activity: Not on file  Other Topics Concern   Not on file  Social History Narrative   Lives with wife       One level      Right hand   Social Determinants of Health   Financial Resource Strain: Low Risk  (07/19/2022)   Overall Financial Resource Strain (CARDIA)    Difficulty of Paying Living Expenses: Not hard at all  Food Insecurity: No Food Insecurity (01/11/2023)   Hunger Vital Sign    Worried About Running Out of Food in the Last Year: Never true    [place] in the Last Year: Never true  Transportation Needs: No Transportation Needs (07/19/2022)   PRAPARE - Hydrologist (Medical): No    Lack of Transportation (Non-Medical): No  Physical Activity: Sufficiently Active (01/11/2023)   Exercise Vital Sign    Days of Exercise per Week: 5 days    Minutes of Exercise per Session: 30 min  Stress: No Stress Concern Present (01/11/2023)   [place]    Feeling of Stress : Not at all  Social Connections: Moderately Isolated (01/11/2023)   Social Connection and Isolation Panel [NHANES]    Frequency of Communication with Friends and Family: Three times a week    Frequency of Social Gatherings with Friends and Family: Three times a week    Attends Religious Services: Never    Active Member of Clubs or Organizations: No    Attends Archivist Meetings: Never    Marital Status: Married    Tobacco Counseling Ready to quit: Not Answered Counseling given: Not Answered Tobacco comments: down to .5 ppd  11/02/2022 hfb   Clinical Intake:     Pain : No/denies pain     Nutritional Status: BMI 25 -29 Overweight Nutritional Risks: None Diabetes: Yes CBG done?: No Did pt. bring  in CBG monitor from home?: No     Diabetic?yes  Interpreter Needed?: No      Activities of Daily Living    01/11/2023    3:31 PM  In your present state of health, do you have any difficulty performing the following activities:  Hearing? 0  Vision? 0  Difficulty concentrating or making decisions? 0  Walking or climbing stairs? 1  Dressing or bathing? 1  Doing errands, shopping? 1  Preparing Food and eating ? N  Using the Toilet? Y  In the past six months, have you accidently leaked urine? N  Do you have problems with loss of bowel control? N  Managing your Medications? Darreld Mclean  Managing your Finances? N  Housekeeping or managing your Housekeeping? Y    Patient Care Team: [name], MD as PCP - General (Family Medicine) Revankar, Reita Cliche, MD as PCP - Cardiology (Cardiology) Richardo Priest, MD as Referring Physician (Cardiology) Melissa Montane, MD as Consulting Physician (Otolaryngology) Lane Hacker, [facility] as Pharmacist (Pharmacist)  Indicate any recent Medical Services you may have received from other than [facility] providers in the past year (date may be approximate).     Assessment:   This is a routine wellness examination for Trevaughn.  Hearing/Vision screen No results found.  Dietary issues and exercise activities discussed: Current Exercise Habits: Home exercise routine, Type of exercise: walking, Time (Minutes): 30, Frequency (Times/Week): 5, Weekly Exercise (Minutes/Week): 150, Intensity: Mild   Goals Addressed   None   Depression Screen    01/11/2023    3:31 PM 09/29/2022   11:53 AM 01/05/2022    3:45 PM 05/20/2021   10:08 AM 03/08/2020    3:08 PM  PHQ 2/9 Scores  PHQ - 2 Score 0 0 0 0 0    Fall Risk    01/11/2023    3:30 PM 09/29/2022   11:52 AM 06/02/2022   11:25 AM 01/05/2022    3:44 PM 05/20/2021   10:09 AM  Fall Risk   Falls in the past year? '1 1 1 '$ 0 1  Number falls in past yr: '1 1 1 '$ 0 1  Injury with Fall? 0 1 0 0 0  Risk for fall due to : History  of fall(s);Impaired balance/gait History of fall(s) History of fall(s)    Follow up Falls evaluation completed Falls evaluation completed Falls evaluation completed Falls evaluation completed Falls evaluation completed    [place]:  Any stairs in or around the home? Yes  If so, are there any without handrails? Yes  Home free of loose throw rugs in walkways, pet beds, electrical cords, etc? Yes  Adequate lighting in your home to reduce risk of falls? Yes   ASSISTIVE DEVICES UTILIZED TO PREVENT FALLS:  Life alert? No  Use of a cane, walker or w/c? Yes  Grab bars in the bathroom? No  Shower chair or bench in shower? No  Elevated toilet seat or a handicapped toilet? Yes   TIMED UP AND GO:  Was the test performed? No .    Cognitive Function:      05/07/2019   10:00 AM  Montreal Cognitive Assessment   Attention: Read list of digits (0/2) 1  Attention: Read list of letters (0/1) 1  Attention: Serial 7 subtraction starting at 100 (0/3) 2  Language: Repeat phrase (0/2) 2  Language : Fluency (0/1) 0  Abstraction (0/2) 0  Delayed Recall (0/5) 2  Orientation (0/6) 6      03/08/2020    3:10 PM  6CIT Screen  What Year? 0 points  What month? 3 points  Count back from 20 0 points  Months in reverse 0 points    Immunizations Immunization History  Administered Date(s) Administered   COVID-19, mRNA, vaccine(Comirnaty)12 years and older 09/22/2022   Fluad Quad(high Dose 65+) 08/16/2020, 09/01/2021, 08/17/2022   H1N1 09/13/2008   Influenza Split 09/01/2021   Influenza, High Dose Seasonal PF 10/13/2017, 08/26/2019, 08/16/2020   Influenza-Unspecified 11/13/2002, 08/19/2004, 09/13/2004, 09/13/2006, 10/17/2007, 08/12/2008, 10/13/2017, 08/26/2019   Moderna SARS-COV2 Booster Vaccination 10/12/2020   Moderna Sars-Covid-2 Vaccination 10/12/2020   PNEUMOCOCCAL CONJUGATE-20 01/11/2023   Pneumococcal Conjugate-13 03/13/2000, 10/01/2015   Pneumococcal  Polysaccharide-23 11/13/2002, 08/13/2009   Pneumococcal-Unspecified 03/13/2000   Td 11/13/1996   Td (Adult),5 Lf Tetanus Toxid, Preservative Free 11/13/1996    TDAP status: Due, Education has been provided regarding the importance of this vaccine. Advised may receive this vaccine at local pharmacy or Health Dept. Aware to provide a copy of the vaccination record if obtained from local pharmacy or Health Dept. Verbalized acceptance and understanding.  Flu Vaccine status: Up to date  Pneumococcal vaccine status: Completed during today's visit.  Covid-19 vaccine status: Completed vaccines  Qualifies for Shingles Vaccine? Yes   Zostavax completed No   Shingrix Completed?: No.    Education has been provided regarding the importance of this vaccine. Patient has been advised to call insurance company to determine out of pocket expense if they have not yet received this vaccine. Advised may also receive vaccine at local pharmacy or Health Dept. Verbalized acceptance and understanding.  RSV: Talk to pharmacist about obtaining  Screening Tests Health Maintenance  Topic Date Due   Hepatitis C Screening  Never done   Zoster Vaccines- Shingrix (1 of 2) Never done   OPHTHALMOLOGY EXAM  01/11/2023 (Originally 10/18/1961)   COVID-19 Vaccine (3 - Mixed Product risk series) 01/27/2023 (Originally 10/20/2022)   HEMOGLOBIN A1C  03/30/2023   Diabetic kidney evaluation - Urine ACR  06/06/2023   Lung Cancer Screening  11/01/2023   Diabetic kidney evaluation - eGFR measurement  11/11/2023   FOOT EXAM  01/11/2024   Medicare Annual Wellness (AWV)  01/11/2024   COLONOSCOPY (Pts 45-86yr Insurance coverage will need to be confirmed)  10/20/2031   Pneumonia Vaccine 72 Years old  Completed   INFLUENZA VACCINE  Completed   HPV VACCINES  Aged Out   DTaP/Tdap/Td  Discontinued    Health Maintenance  Health Maintenance Due  Topic Date Due   Hepatitis C Screening  Never done   Zoster Vaccines- Shingrix (1 of  2) Never done    Colorectal cancer screening: No longer required.   Lung Cancer Screening: (Low Dose CT Chest recommended if Age 961-80years, 30 pack-year currently smoking OR have quit w/in 15years.) does not qualify.   Lung Cancer Screening Referral:   Additional Screening:  Hepatitis C Screening: does not qualify;  Vision Screening: Recommended annual ophthalmology exams for early detection of glaucoma and other disorders of the eye. Is the patient up to date with their annual eye exam?  No  Who is the provider or what is the name of the office in which the patient attends annual eye exams? Dr. WGilford Rileand [place] for macular degenerative shots If pt is not established with a provider, would they like to be referred to a provider to establish care? No .   Dental Screening: Recommended annual dental exams for proper oral hygiene  Community Resource Referral / Chronic Care Management: CRR required this visit?  No   CCM required this visit?  No      Plan:     I have personally reviewed and noted the following in the patient's chart:   Medical and social history Use of alcohol, tobacco or illicit drugs  Current medications and supplements including opioid prescriptions. Patient is currently taking opioid prescriptions. Information provided to patient regarding non-opioid alternatives. Patient advised to discuss non-opioid treatment plan with their provider. Functional ability and status Nutritional status Physical activity Advanced directives List of other physicians Hospitalizations, surgeries, and ER visits in previous 12 months Vitals Screenings to include cognitive, depression, and falls Referrals and appointments  In addition, I have reviewed and discussed with patient certain preventive protocols, quality metrics, and best practice recommendations. A written personalized care plan for preventive services as well as general preventive health recommendations were  provided to patient.       Geralynn Ochs I Leal-Borjas,acting as a scribe for [name], MD.,have documented all relevant documentation on the behalf of [name], MD,as directed by  [name], MD while in the presence of [name], MD.

## 2023-01-11 NOTE — Assessment & Plan Note (Addendum)
Well controlled.  No medication changes recommended. Continue Metoprolol tartrate 100 mg one daily, losartan 50 mg daily. Continue healthy diet and exercise.

## 2023-01-11 NOTE — Assessment & Plan Note (Signed)
Continue metoprolol, losartan, fish oil and crestor

## 2023-01-11 NOTE — Assessment & Plan Note (Signed)
-

## 2023-01-13 DIAGNOSIS — J019 Acute sinusitis, unspecified: Secondary | ICD-10-CM | POA: Insufficient documentation

## 2023-01-13 DIAGNOSIS — Z0000 Encounter for general adult medical examination without abnormal findings: Secondary | ICD-10-CM

## 2023-01-13 DIAGNOSIS — E559 Vitamin D deficiency, unspecified: Secondary | ICD-10-CM | POA: Insufficient documentation

## 2023-01-13 DIAGNOSIS — I700 Atherosclerosis of aorta: Secondary | ICD-10-CM

## 2023-01-13 DIAGNOSIS — J0100 Acute maxillary sinusitis, unspecified: Secondary | ICD-10-CM

## 2023-01-13 HISTORY — DX: Acute sinusitis, unspecified: J01.90

## 2023-01-13 HISTORY — DX: Vitamin D deficiency, unspecified: E55.9

## 2023-01-13 HISTORY — DX: Encounter for general adult medical examination without abnormal findings: Z00.00

## 2023-01-13 HISTORY — DX: Atherosclerosis of aorta: I70.0

## 2023-01-13 HISTORY — DX: Acute maxillary sinusitis, unspecified: J01.00

## 2023-01-13 NOTE — Assessment & Plan Note (Signed)
Check labs 

## 2023-01-13 NOTE — Assessment & Plan Note (Signed)
Bactrim sent

## 2023-01-13 NOTE — Assessment & Plan Note (Signed)
Not at goal. Be sure patient is taking fish oil 2 gm twice daily and crestor 40 mg before bed.  Continue to work on eating a healthy diet and exercise.  Labs drawn today.

## 2023-01-13 NOTE — Assessment & Plan Note (Signed)

## 2023-01-14 ENCOUNTER — Encounter: Payer: Self-pay | Admitting: Family Medicine

## 2023-01-14 NOTE — Assessment & Plan Note (Signed)
Continue oxygen 2 L daily.

## 2023-01-14 NOTE — Assessment & Plan Note (Signed)
Continue rosuvastatin 40 mg before bed.

## 2023-01-15 DIAGNOSIS — J449 Chronic obstructive pulmonary disease, unspecified: Secondary | ICD-10-CM | POA: Diagnosis not present

## 2023-01-15 DIAGNOSIS — J9610 Chronic respiratory failure, unspecified whether with hypoxia or hypercapnia: Secondary | ICD-10-CM | POA: Diagnosis not present

## 2023-01-16 ENCOUNTER — Other Ambulatory Visit: Payer: Self-pay | Admitting: Family Medicine

## 2023-01-16 LAB — ALPHA-1 ANTITRYPSIN PHENOTYPE: A-1 Antitrypsin, Ser: 143 mg/dL (ref 83–199)

## 2023-01-17 ENCOUNTER — Telehealth: Payer: Self-pay

## 2023-01-17 NOTE — Progress Notes (Signed)
Care Management & Coordination Services Pharmacy Team  Reason for Encounter: General adherence update   Contacted patient for general health update and medication adherence call.  Unsuccessful outreach. Left voicemail for patient to return call.    Chart Updates: Recent office visits:  01-11-2023 [name], MD.  COMPLETED prednisone and levaquin. START bactrim 800-160 mg twice daily. PNA Vac given  10-26-2022 Caudle, Angie A, CMA. Abnormal UA.   09-29-2022 [name], MD. B12= 221. WBC= 12.9, MCH= 25.7, Neutrophils Absolute= 8.6. Glucose= 197. A1C= 7.5. Trig= 292, HDL= 24, VLDL= 44. Methylmalonic Acid= 452. TSH= 4.950. Vit D= 17.6. STOP breo ellipta.    09-22-2022 [name], LPN. Covid booster vaccine given.   08-30-2022 [name], MD. FINISHED keflex. START levofloxacin 500 mg daily and prednisone 20 mg follow instructions.   Recent consult visits:  01-04-2023 Brand Males, MD (Pulmonology). WBC= 11.8, Hemo= 12.1, HCT= 36.5, MCV= 75.5, RDW= 16.4, monocyte abs= 1.1.  12-22-2022 Bettina Gavia, Hilton Cork, MD (Cardiology). START lasix 20 mg daily and magnesium 64 mg daily.  11-02-2022 [name], NP (Pulmonology). START Daliresp 500 mcg Take 0.5 tablets (250 mcg total) by mouth daily for 28 days, THEN 1 tablet (500 mcg total) daily.     10-26-2022 Brand Males, MD (Pulmonology). Pulmonary function test.   08-17-2022 [name], [name], NP (Pulmonary). Glucose= 239. WBC= 11.4, Hemo= 12.9, HCT= 38.8, RDW= 16.1. STOP Breo ellipta and spiriva. START brovana 2 mls by nebulization twice daily, pulmicort Take 2 mLs (0.5 mg total) by nebulization in the morning and at bedtime and Yupelri 175 mcg by nebulization daily. Pulmonary function and CT Chest Wo Contrast ordered.Flu vaccine given.    Hospital visits:  Medication Reconciliation was completed by comparing discharge summary, patient's EMR and Pharmacy list, and upon discussion with patient.   Admitted to the  hospital on 11-09-2022 due to Ventricular ectopy. Discharge date was 11-10-2022. Discharged from [facility]?Medications Started at [facility] Discharge:?? Losartan 50 mg daily   Medication Changes at Hospital Discharge: Metoprolol 50 mg twice daily TO 100 mg daily   Medications Discontinued at Hospital Discharge: None   Medications that remain the same after Hospital Discharge:??  -All other medications will remain the same.   Medications: Outpatient Encounter Medications as of 01/17/2023  Medication Sig   ALPRAZolam (XANAX) 0.25 MG tablet TAKE 1 TABLET BY MOUTH ONCE DAILY AS NEEDED FOR ANXIETY   arformoterol (BROVANA) 15 MCG/2ML NEBU Take 2 mLs (15 mcg total) by nebulization 2 (two) times daily.   budesonide (PULMICORT) 0.25 MG/2ML nebulizer solution Take 2 mLs (0.25 mg total) by nebulization in the morning and at bedtime.   clopidogrel (PLAVIX) 75 MG tablet Take 1 tablet by mouth once daily   cyanocobalamin (VITAMIN B12) 1000 MCG/ML injection INJECT 1 ML (CC) INTRAMUSCULARLY ONCE A WEEK   cyclobenzaprine (FLEXERIL) 10 MG tablet Take 1 tablet (10 mg total) by mouth See admin instructions. Take 1 tablet by mouth three times daily as needed for muscle spasm   DULoxetine (CYMBALTA) 60 MG capsule Take 1 capsule (60 mg total) by mouth 2 (two) times daily.   fenofibrate 160 MG tablet Take 1 tablet by mouth once daily   finasteride (PROSCAR) 5 MG tablet Take 1 tablet by mouth once daily (Patient taking differently: Take 5 mg by mouth daily.)   furosemide (LASIX) 20 MG tablet Take 1 tablet (20 mg total) by mouth daily.   gabapentin (NEURONTIN) 400 MG capsule TAKE 1 CAPSULE BY MOUTH  THREE TIMES DAILY   glucose blood (ONETOUCH ULTRA) test strip USE 1 STRIP TO CHECK GLUCOSE IN THE MORNING AND 1 AT BEDTIME AS DIRECTED   ipratropium-albuterol (DUONEB) 0.5-2.5 (3) MG/3ML SOLN USE 1 AMPULE IN NEBULIZER 4 TIMES DAILY (Patient taking differently: Take 3 mLs by nebulization See admin  instructions. USE 1 AMPULE IN NEBULIZER 4 TIMES DAILY)   Lancets (ONETOUCH DELICA PLUS 123XX123) MISC USE 1  TO CHECK GLUCOSE THREE TIMES DAILY (Patient taking differently: 1 each by Other route See admin instructions. USE 1 TO CHECK GLUCOSE THREE TIMES DAILY)   levothyroxine (SYNTHROID) 25 MCG tablet Take 1 tablet by mouth once daily   losartan (COZAAR) 50 MG tablet Take 1 tablet (50 mg total) by mouth daily.   magnesium chloride (SLOW-MAG) 64 MG TBEC SR tablet Take 1 tablet (64 mg total) by mouth daily.   metFORMIN (GLUCOPHAGE) 1000 MG tablet Take 1 tablet (1,000 mg total) by mouth 2 (two) times daily with a meal.   metoprolol succinate (TOPROL-XL) 100 MG 24 hr tablet Take 1 tablet (100 mg total) by mouth daily. Take with or immediately following a meal.   MYRBETRIQ 25 MG TB24 tablet Take 1 tablet by mouth once daily (Patient taking differently: Take 25 mg by mouth daily.)   nitroGLYCERIN (NITROSTAT) 0.4 MG SL tablet DISSOLVE ONE TABLET UNDER THE TONGUE EVERY 5 TO 10 MINUTES PRIOR TO ACTIVITIES WHICH MIGHT PRECIPITATE AN ATTACK (Patient taking differently: Place 0.4 mg under the tongue every 5 (five) minutes as needed for chest pain.)   nystatin cream (MYCOSTATIN) Apply 1 application topically 2 (two) times daily as needed for dry skin.   Omega-3 Fatty Acids (FISH OIL) 1000 MG CAPS Take 2 capsules (2,000 mg total) by mouth in the morning and at bedtime.   omeprazole (PRILOSEC) 20 MG capsule Take 1 capsule by mouth once daily (Patient taking differently: Take 20 mg by mouth daily.)   oxyCODONE-acetaminophen (PERCOCET/ROXICET) 5-325 MG tablet Take 1 tablet by mouth every 6 (six) hours as needed for severe pain.   revefenacin (YUPELRI) 175 MCG/3ML nebulizer solution Take 3 mLs (175 mcg total) by nebulization daily.   roflumilast (DALIRESP) 500 MCG TABS tablet Take 0.5 tablets (250 mcg total) by mouth daily for 28 days, THEN 1 tablet (500 mcg total) daily.   rosuvastatin (CRESTOR) 40 MG tablet Take 1  tablet by mouth once daily (Patient taking differently: Take 40 mg by mouth daily.)   sulfamethoxazole-trimethoprim (BACTRIM DS) 800-160 MG tablet Take 1 tablet by mouth 2 (two) times daily.   tamsulosin (FLOMAX) 0.4 MG CAPS capsule TAKE 2 CAPSULES BY MOUTH ONCE DAILY AFTER SUPPER (Patient taking differently: Take 0.8 mg by mouth daily after supper.)   TOUJEO SOLOSTAR 300 UNIT/ML Solostar Pen INJECT 30 UNITS SUBCUTANEOUSLY ONCE DAILY   traZODone (DESYREL) 150 MG tablet Take 1 tablet (150 mg total) by mouth daily. (Patient taking differently: Take 75 mg by mouth at bedtime.)   VENTOLIN HFA 108 (90 Base) MCG/ACT inhaler INHALE 2 PUFFS BY MOUTH EVERY 6 HOURS AS NEEDED FOR SHORTNESS OF BREATH FOR WHEEZING (Patient taking differently: Inhale 2 puffs into the lungs See admin instructions. INHALE 2 PUFFS BY MOUTH EVERY 6 HOURS AS NEEDED FOR SHORTNESS OF BREATH FOR WHEEZING)   Vitamin D, Ergocalciferol, (DRISDOL) 1.25 MG (50000 UNIT) CAPS capsule TAKE 1 CAPSULE BY MOUTH TWICE A WEEK   No facility-administered encounter medications on file as of 01/17/2023.    Recent vitals BP Readings from Last 3 Encounters:  01/11/23 108/60  01/04/23 116/64  12/22/22 (!) 154/64   Pulse Readings from Last 3 Encounters:  01/11/23 82  01/04/23 90  12/22/22 87   Wt Readings from Last 3 Encounters:  01/11/23 196 lb (88.9 kg)  01/04/23 194 lb 6.4 oz (88.2 kg)  12/22/22 199 lb 6.4 oz (90.4 kg)   BMI Readings from Last 3 Encounters:  01/11/23 29.80 kg/m  01/04/23 29.56 kg/m  12/22/22 30.32 kg/m    Recent lab results    Component Value Date/Time   NA 136 11/10/2022 1400   NA 141 09/29/2022 1239   K 4.1 11/10/2022 1400   CL 104 11/10/2022 1400   CO2 25 11/10/2022 1400   GLUCOSE 227 (H) 11/10/2022 1400   BUN 12 11/10/2022 1400   BUN 12 09/29/2022 1239   CREATININE 0.93 11/10/2022 1400   CALCIUM 8.7 (L) 11/10/2022 1400    Lab Results  Component Value Date   CREATININE 0.93 11/10/2022   GFR 74.13  08/17/2022   EGFR 91 09/29/2022   GFRNONAA >60 11/10/2022   GFRAA 100 12/16/2020   Lab Results  Component Value Date/Time   HGBA1C 7.5 (H) 09/29/2022 12:39 PM   HGBA1C 7.9 (H) 05/19/2022 08:23 AM   MICROALBUR 30 05/23/2021 09:39 AM   MICROALBUR 80 08/23/2020 02:39 PM    Lab Results  Component Value Date   CHOL 103 09/29/2022   HDL 24 (L) 09/29/2022   LDLCALC 35 09/29/2022   TRIG 292 (H) 09/29/2022   CHOLHDL 4.3 09/29/2022   01-17-2023: 1st attempt left VM 01-18-2023: 2nd attempt left VM 01-19-2023: 3rd attempt left VM  Care Gaps: Annual wellness visit in last year? Yes Ophthalmology overdue Hep C screening overdue Shingrix overdue  Star Rating Drugs:  Rosuvastatin 40 mg- Last filled 10-26-2022 90 DS. Previous 07-31-2022 90 DS Metformin 1000 mg- Last filled 12-26-2022 90 DS. Previous 10-01-2022 90 DS Losartan 50 mg- Last filled 01-05-2023 90 DS. Previous 11-10-2022 90 DS  [place] Clinical Pharmacist Assistant [phone]

## 2023-01-18 ENCOUNTER — Other Ambulatory Visit: Payer: Self-pay

## 2023-01-18 ENCOUNTER — Other Ambulatory Visit: Payer: Medicare Other

## 2023-01-18 DIAGNOSIS — E559 Vitamin D deficiency, unspecified: Secondary | ICD-10-CM

## 2023-01-18 DIAGNOSIS — E782 Mixed hyperlipidemia: Secondary | ICD-10-CM | POA: Diagnosis not present

## 2023-01-18 DIAGNOSIS — E1142 Type 2 diabetes mellitus with diabetic polyneuropathy: Secondary | ICD-10-CM

## 2023-01-18 MED ORDER — LEVOFLOXACIN 750 MG PO TABS: 750.0000 mg | ORAL_TABLET | Freq: Every day | ORAL | 0 refills | Status: DC

## 2023-01-20 ENCOUNTER — Other Ambulatory Visit: Payer: Self-pay | Admitting: Family Medicine

## 2023-01-20 DIAGNOSIS — E782 Mixed hyperlipidemia: Secondary | ICD-10-CM

## 2023-01-20 LAB — COMPREHENSIVE METABOLIC PANEL
ALT: 12 IU/L (ref 0–44)
AST: 15 IU/L (ref 0–40)
Albumin/Globulin Ratio: 1.5 (ref 1.2–2.2)
Albumin: 4.1 g/dL (ref 3.8–4.8)
Alkaline Phosphatase: 93 IU/L (ref 44–121)
BUN/Creatinine Ratio: 8 — ABNORMAL LOW (ref 10–24)
BUN: 9 mg/dL (ref 8–27)
Bilirubin Total: 0.3 mg/dL (ref 0.0–1.2)
CO2: 23 mmol/L (ref 20–29)
Calcium: 9.3 mg/dL (ref 8.6–10.2)
Chloride: 98 mmol/L (ref 96–106)
Creatinine, Ser: 1.13 mg/dL (ref 0.76–1.27)
Globulin, Total: 2.7 g/dL (ref 1.5–4.5)
Glucose: 141 mg/dL — ABNORMAL HIGH (ref 70–99)
Potassium: 4.8 mmol/L (ref 3.5–5.2)
Sodium: 139 mmol/L (ref 134–144)
Total Protein: 6.8 g/dL (ref 6.0–8.5)
eGFR: 69 mL/min/{1.73_m2} (ref >59)

## 2023-01-20 LAB — CBC WITH DIFFERENTIAL/PLATELET
Basophils Absolute: 0.1 10*3/uL (ref 0.0–0.2)
Basos: 1 %
EOS (ABSOLUTE): 0.3 10*3/uL (ref 0.0–0.4)
Eos: 2 %
Hematocrit: 38.3 % (ref 37.5–51.0)
Hemoglobin: 12.1 g/dL — ABNORMAL LOW (ref 13.0–17.7)
Immature Grans (Abs): 0.1 10*3/uL (ref 0.0–0.1)
Immature Granulocytes: 1 %
Lymphocytes Absolute: 4.6 10*3/uL — ABNORMAL HIGH (ref 0.7–3.1)
Lymphs: 29 %
MCH: 24.5 pg — ABNORMAL LOW (ref 26.6–33.0)
MCHC: 31.6 g/dL (ref 31.5–35.7)
MCV: 78 fL — ABNORMAL LOW (ref 79–97)
Monocytes Absolute: 1.1 10*3/uL — ABNORMAL HIGH (ref 0.1–0.9)
Monocytes: 7 %
Neutrophils Absolute: 9.7 10*3/uL — ABNORMAL HIGH (ref 1.4–7.0)
Neutrophils: 60 %
Platelets: 279 10*3/uL (ref 150–450)
RBC: 4.93 x10E6/uL (ref 4.14–5.80)
RDW: 15.5 % — ABNORMAL HIGH (ref 11.6–15.4)
WBC: 15.8 10*3/uL — ABNORMAL HIGH (ref 3.4–10.8)

## 2023-01-20 LAB — LIPID PANEL
Chol/HDL Ratio: 4.2 ratio (ref 0.0–5.0)
Cholesterol, Total: 106 mg/dL (ref 100–199)
HDL: 25 mg/dL — ABNORMAL LOW (ref >39)
LDL Chol Calc (NIH): 40 mg/dL (ref 0–99)
Triglycerides: 267 mg/dL — ABNORMAL HIGH (ref 0–149)
VLDL Cholesterol Cal: 41 mg/dL — ABNORMAL HIGH (ref 5–40)

## 2023-01-20 LAB — CARDIOVASCULAR RISK ASSESSMENT

## 2023-01-20 LAB — HEMOGLOBIN A1C
Est. average glucose Bld gHb Est-mCnc: 197 mg/dL
Hgb A1c MFr Bld: 8.5 % — ABNORMAL HIGH (ref 4.8–5.6)

## 2023-01-20 LAB — MICROALBUMIN / CREATININE URINE RATIO
Creatinine, Urine: 117.4 mg/dL
Microalb/Creat Ratio: 20 mg/g creat (ref 0–29)
Microalbumin, Urine: 23.9 ug/mL

## 2023-01-20 LAB — VITAMIN D 25 HYDROXY (VIT D DEFICIENCY, FRACTURES): Vit D, 25-Hydroxy: 53 ng/mL (ref 30.0–100.0)

## 2023-01-24 ENCOUNTER — Telehealth: Payer: Self-pay

## 2023-01-24 ENCOUNTER — Encounter: Payer: Self-pay | Admitting: Internal Medicine

## 2023-01-24 DIAGNOSIS — J329 Chronic sinusitis, unspecified: Secondary | ICD-10-CM

## 2023-01-24 NOTE — Telephone Encounter (Signed)
Called and spoke with patient's wife Don [name]. She stated that the patient was seen by his PCP on 01/11/23 and was prescribed levaquin due to sinus drainage. Per the PCP, his lung sounded clear. He has finished the levaquin and his cough is no better. He has been coughing up thick phlegm that ranges from cloudy to greenish brown. Denies seeing any blood. Also denied any increased SOB or wheezing. She called his PCP and she suggested Mucinex '1200mg'$  twice daily. She has been checking his O2 and it has been running around 96%-97%. Confirmed that he is still using budesonide, Maretta Bees and Garlon Hatchet has prescribed. Still using 2L of O2 at bedtime.   She wanted to see if Dr. Chase Caller had any more recommendations for him. Did offer to send to DOD but she wanted to hear from MR.   Pharmacy is [facility] nurse in [place].   Dr. Chase Caller, can you please advise?

## 2023-01-24 NOTE — Telephone Encounter (Signed)
Patient's wife was informed.  They were seen by pulmonary the week before their visit with Dr. Tobie Poet.  Dr. Tobie Poet advised that they try the maximum dose of guaifenesin 1200 mg twice daily and if no improvement then they should follow-up with pulmonary.

## 2023-01-24 NOTE — Telephone Encounter (Signed)
Patient's wife called stating that the patient is concerned because he has finished the levoquin and is still coughing up [place]. She states he is still taking the mucinex cause he takes it daily due to his COPD. She is wanting to know how much She should be giving him of the mucinex daily? Please advise.

## 2023-01-24 NOTE — Telephone Encounter (Signed)
Spoke with pt's wife and discussed PAP process. Application has been sent to wife's email address via [employer]. Will await completion of application.

## 2023-01-25 ENCOUNTER — Encounter: Payer: Self-pay | Admitting: Internal Medicine

## 2023-01-25 MED ORDER — PREDNISONE 10 MG PO TABS: ORAL_TABLET | ORAL | 0 refills | Status: DC

## 2023-01-25 NOTE — Telephone Encounter (Signed)
Spoke with pts wife. Pt and wife questioning why they havent heard back from provider, I reminded pt's wife that they preferred to hear from MR instead of DOD on 3/13. MR was not in office on 3/13 but is in office today to answer message. They verbalized understanding. Symptoms havent changed since yesterday in Cherina's note.    Will forward to MR to advise today. Thanks.

## 2023-01-25 NOTE — Telephone Encounter (Signed)
Called and spoke to pt's wife. Rx has been sent to pharmacy. CT sinus has been ordered. Pt's wife states they are currently in the process trying to get pt assistance for Dupixent and are in contact with the pharmacy team regarding this. Nothing further needed at this time.

## 2023-01-25 NOTE — Telephone Encounter (Signed)
See other pt email.  

## 2023-01-25 NOTE — Telephone Encounter (Signed)
He has severe bordering on very severe copd.  Currently I think he is got sinusitis with possible acute exacerbation COPD.  We looeked for risk factors for frequent COPD exacerbations.  However his vitamin D is normal.  Alpha-1 antitrypsin is normal.  Kidney function is normal. And blood allergy panel is normal  And Daliresp was too expensive  With the blood work that we did in February 2024: Although his blood allergy panel is normal his blood eosinophils are elevated suggesting that he will benefit from long-term Dupixent to reduce his COPD exacerbation frequency  PLAN  - Please process long-term Dupixent for frequent COPD exacerbations and elevated eosinophils -> think this is an opportunity for this medication to make a difference for him -Also get a CT scan of the sinus without contrast -Meanwhile I think he needs to do a prednisone taper again -> Take prednisone 40 mg daily x 2 days, then '20mg'$  daily x 2 days, then '10mg'$  daily x 2 days, then '5mg'$  daily x 2 days and stop -MDP smoking he should quit smoking   Allergies  Allergen Reactions   Penicillins Rash and Hives    Has patient had a PCN reaction causing immediate rash, facial/tongue/throat swelling, SOB or lightheadedness with hypotension:unsure Has patient had a PCN reaction causing severe rash involving mucus membranes or skin necrosis:unsure Has patient had a PCN reaction that required hospitalization:No Has patient had a PCN reaction occurring within the last 10 years:No If all of the above answers are "NO", then may proceed with Cephalosporin use.  rash   Bactrim [Sulfamethoxazole-Trimethoprim]     Dizziness, confusion, involuntary movements   Doxycycline Rash   Iodinated Contrast Media Rash    Also developed blisters   Penicillin G Rash   Tramadol Rash     reports that he has been smoking cigarettes. He has a 50.00 pack-year smoking history. He has never used smokeless tobacco.       Latest Ref Rng & Units  10/26/2022   10:46 AM  PFT Results  FVC-Pre L 2.32   FVC-Predicted Pre % 58   FVC-Post L 2.84   FVC-Predicted Post % 71   Pre FEV1/FVC % % 48   Post FEV1/FCV % % 44   FEV1-Pre L 1.11   FEV1-Predicted Pre % 38   FEV1-Post L 1.24   DLCO uncorrected ml/min/mmHg 9.16   DLCO UNC% % 38   DLCO corrected ml/min/mmHg 9.59   DLCO COR %Predicted % 40   DLVA Predicted % 48     Latest Reference Range & Units 08/17/22 10:15 01/04/23 12:08  Eosinophils Absolute 0.0 - 0.7 K/uL 0.3 0.3     Current Outpatient Medications:    ALPRAZolam (XANAX) 0.25 MG tablet, TAKE 1 TABLET BY MOUTH ONCE DAILY AS NEEDED FOR ANXIETY, Disp: 30 tablet, Rfl: 2   arformoterol (BROVANA) 15 MCG/2ML NEBU, Take 2 mLs (15 mcg total) by nebulization 2 (two) times daily., Disp: 120 mL, Rfl: 6   budesonide (PULMICORT) 0.25 MG/2ML nebulizer solution, Take 2 mLs (0.25 mg total) by nebulization in the morning and at bedtime., Disp: 60 mL, Rfl: 11   clopidogrel (PLAVIX) 75 MG tablet, Take 1 tablet by mouth once daily, Disp: 90 tablet, Rfl: 0   cyanocobalamin (VITAMIN B12) 1000 MCG/ML injection, INJECT 1 ML (CC) INTRAMUSCULARLY ONCE A WEEK, Disp: 10 mL, Rfl: 0   cyclobenzaprine (FLEXERIL) 10 MG tablet, Take 1 tablet (10 mg total) by mouth See admin instructions. Take 1 tablet by mouth three times daily as  needed for muscle spasm, Disp: 270 tablet, Rfl: 1   DULoxetine (CYMBALTA) 60 MG capsule, Take 1 capsule (60 mg total) by mouth 2 (two) times daily., Disp: 180 capsule, Rfl: 1   fenofibrate 160 MG tablet, Take 1 tablet by mouth once daily, Disp: 90 tablet, Rfl: 0   finasteride (PROSCAR) 5 MG tablet, Take 1 tablet by mouth once daily (Patient taking differently: Take 5 mg by mouth daily.), Disp: 90 tablet, Rfl: 1   furosemide (LASIX) 20 MG tablet, Take 1 tablet (20 mg total) by mouth daily., Disp: 90 tablet, Rfl: 3   gabapentin (NEURONTIN) 400 MG capsule, TAKE 1 CAPSULE BY MOUTH THREE TIMES DAILY, Disp: 270 capsule, Rfl: 0   glucose  blood (ONETOUCH ULTRA) test strip, USE 1 STRIP TO CHECK GLUCOSE IN THE MORNING AND 1 AT BEDTIME AS DIRECTED, Disp: 100 each, Rfl: 3   ipratropium-albuterol (DUONEB) 0.5-2.5 (3) MG/3ML SOLN, USE 1 AMPULE IN NEBULIZER 4 TIMES DAILY (Patient taking differently: Take 3 mLs by nebulization See admin instructions. USE 1 AMPULE IN NEBULIZER 4 TIMES DAILY), Disp: 360 mL, Rfl: 1   Lancets (ONETOUCH DELICA PLUS 123XX123) MISC, USE 1  TO CHECK GLUCOSE THREE TIMES DAILY (Patient taking differently: 1 each by Other route See admin instructions. USE 1 TO CHECK GLUCOSE THREE TIMES DAILY), Disp: 100 each, Rfl: 0   levofloxacin (LEVAQUIN) 750 MG tablet, Take 1 tablet (750 mg total) by mouth daily., Disp: 7 tablet, Rfl: 0   levothyroxine (SYNTHROID) 25 MCG tablet, Take 1 tablet by mouth once daily, Disp: 90 tablet, Rfl: 0   losartan (COZAAR) 50 MG tablet, Take 1 tablet (50 mg total) by mouth daily., Disp: 90 tablet, Rfl: 3   magnesium chloride (SLOW-MAG) 64 MG TBEC SR tablet, Take 1 tablet (64 mg total) by mouth daily., Disp: 90 tablet, Rfl: 3   metFORMIN (GLUCOPHAGE) 1000 MG tablet, Take 1 tablet (1,000 mg total) by mouth 2 (two) times daily with a meal., Disp: 180 tablet, Rfl: 2   metoprolol succinate (TOPROL-XL) 100 MG 24 hr tablet, Take 1 tablet (100 mg total) by mouth daily. Take with or immediately following a meal., Disp: 90 tablet, Rfl: 3   MYRBETRIQ 25 MG TB24 tablet, Take 1 tablet by mouth once daily (Patient taking differently: Take 25 mg by mouth daily.), Disp: 30 tablet, Rfl: 0   nitroGLYCERIN (NITROSTAT) 0.4 MG SL tablet, DISSOLVE ONE TABLET UNDER THE TONGUE EVERY 5 TO 10 MINUTES PRIOR TO ACTIVITIES WHICH MIGHT PRECIPITATE AN ATTACK (Patient taking differently: Place 0.4 mg under the tongue every 5 (five) minutes as needed for chest pain.), Disp: 25 tablet, Rfl: 0   nystatin cream (MYCOSTATIN), Apply 1 application topically 2 (two) times daily as needed for dry skin., Disp: , Rfl:    Omega-3 Fatty Acids  (FISH OIL) 1000 MG CAPS, Take 2 capsules (2,000 mg total) by mouth in the morning and at bedtime., Disp: 180 capsule, Rfl: 12   omeprazole (PRILOSEC) 20 MG capsule, Take 1 capsule by mouth once daily (Patient taking differently: Take 20 mg by mouth daily.), Disp: 90 capsule, Rfl: 0   oxyCODONE-acetaminophen (PERCOCET/ROXICET) 5-325 MG tablet, Take 1 tablet by mouth every 6 (six) hours as needed for severe pain., Disp: 120 tablet, Rfl: 0   revefenacin (YUPELRI) 175 MCG/3ML nebulizer solution, Take 3 mLs (175 mcg total) by nebulization daily., Disp: 90 mL, Rfl: 11   roflumilast (DALIRESP) 500 MCG TABS tablet, Take 0.5 tablets (250 mcg total) by mouth daily for 28 days, THEN  1 tablet (500 mcg total) daily., Disp: 30 tablet, Rfl: 11   rosuvastatin (CRESTOR) 40 MG tablet, Take 1 tablet by mouth once daily, Disp: 90 tablet, Rfl: 0   sulfamethoxazole-trimethoprim (BACTRIM DS) 800-160 MG tablet, Take 1 tablet by mouth 2 (two) times daily., Disp: 20 tablet, Rfl: 0   tamsulosin (FLOMAX) 0.4 MG CAPS capsule, TAKE 2 CAPSULES BY MOUTH ONCE DAILY AFTER SUPPER (Patient taking differently: Take 0.8 mg by mouth daily after supper.), Disp: 180 capsule, Rfl: 1   TOUJEO SOLOSTAR 300 UNIT/ML Solostar Pen, INJECT 30 UNITS SUBCUTANEOUSLY ONCE DAILY, Disp: 6 mL, Rfl: 0   traZODone (DESYREL) 150 MG tablet, Take 1 tablet (150 mg total) by mouth daily. (Patient taking differently: Take 75 mg by mouth at bedtime.), Disp: 270 tablet, Rfl: 0   VENTOLIN HFA 108 (90 Base) MCG/ACT inhaler, INHALE 2 PUFFS BY MOUTH EVERY 6 HOURS AS NEEDED FOR SHORTNESS OF BREATH FOR WHEEZING (Patient taking differently: Inhale 2 puffs into the lungs See admin instructions. INHALE 2 PUFFS BY MOUTH EVERY 6 HOURS AS NEEDED FOR SHORTNESS OF BREATH FOR WHEEZING), Disp: 18 g, Rfl: 0   Vitamin D, Ergocalciferol, (DRISDOL) 1.25 MG (50000 UNIT) CAPS capsule, TAKE 1 CAPSULE BY MOUTH TWICE A WEEK, Disp: 10 capsule, Rfl: 0

## 2023-01-26 NOTE — Telephone Encounter (Signed)
Pt portion has been received and placed in the "Awaiting Response" folder. Provider portion has been placed in MR's box for signature.

## 2023-01-28 ENCOUNTER — Other Ambulatory Visit: Payer: Self-pay | Admitting: Nurse Practitioner

## 2023-01-28 ENCOUNTER — Telehealth: Payer: Self-pay | Admitting: Nurse Practitioner

## 2023-01-28 DIAGNOSIS — E1142 Type 2 diabetes mellitus with diabetic polyneuropathy: Secondary | ICD-10-CM

## 2023-01-28 MED ORDER — INSULIN LISPRO 100 UNIT/ML IJ SOLN: 4.0000 [IU] | Freq: Three times a day (TID) | INTRAMUSCULAR | 11 refills | Status: DC

## 2023-01-29 ENCOUNTER — Telehealth: Payer: Self-pay

## 2023-01-29 NOTE — Telephone Encounter (Signed)
I called to follow-up on Don [name].  His blood sugar was elevated over the week-end and he was started on short acting insulin.  His blood sugar this morning was 103.  His oxygen saturation was 96-97%.  He continues to have issues with thick mucous and pulmonary is working on getting a CT of his sinuses.

## 2023-01-31 ENCOUNTER — Other Ambulatory Visit: Payer: Self-pay | Admitting: Family Medicine

## 2023-01-31 MED ORDER — OXYCODONE-ACETAMINOPHEN 5-325 MG PO TABS: 1.0000 | ORAL_TABLET | Freq: Four times a day (QID) | ORAL | 0 refills | Status: DC | PRN

## 2023-01-31 NOTE — Telephone Encounter (Signed)
Submitted Patient Assistance Application to [place] for [facility] along with provider portion and PA. Will update patient when we receive a response.  Phone #: [phone] Fax #: [phone]

## 2023-02-01 DIAGNOSIS — R519 Headache, unspecified: Secondary | ICD-10-CM | POA: Diagnosis not present

## 2023-02-01 DIAGNOSIS — J329 Chronic sinusitis, unspecified: Secondary | ICD-10-CM | POA: Diagnosis not present

## 2023-02-02 ENCOUNTER — Telehealth: Payer: Self-pay | Admitting: Internal Medicine

## 2023-02-02 NOTE — Telephone Encounter (Signed)
Called and spoke with patient's wife Anne Ng. She was calling to let MR that the patient had his CT sinus scan at [facility]. She wanted MR to look at the results and send in something for his possible sinus infection. Below is a copy of the impression:   CLINICAL DATA: Chronic sinusitis, facial pain  EXAM: CT MAXILLOFACIAL WITHOUT CONTRAST  TECHNIQUE: Multidetector CT imaging of the maxillofacial structures was performed. Multiplanar CT image reconstructions were also generated.  RADIATION DOSE REDUCTION: This exam was performed according to the departmental dose-optimization program which includes automated exposure control, adjustment of the mA and/or kV according to patient size and/or use of iterative reconstruction technique.  COMPARISON: None Available.  FINDINGS: Osseous: No acute findings are seen in bony structures. Defects are seen in the medial walls of both maxillary sinuses suggesting previous intervention. Postsurgical changes are noted in the ethmoid sinuses.  Orbits: Optic globes are symmetrical. Retrobulbar soft tissues are unremarkable.  Sinuses: There are no air-fluid levels. Mucosal thickening is seen in both maxillary sinuses. There is mild mucosal thickening in frontal, ethmoid and sphenoid sinuses.  Soft tissues: Scattered arterial calcifications are seen.  Limited intracranial: Cortical sulci are prominent.  IMPRESSION: Chronic frontal, ethmoid, sphenoid and a maxillary sinusitis. There are no air-fluid levels. Postsurgical changes are noted in maxillary and ethmoid sinuses.  Cortical sulci in the brain appear prominent suggesting atrophy. Scattered arterial calcifications are seen.   Electronically Signed By: Elmer Picker M.D. On: 02/01/2023 09:48   Actual CT images can be found in PACS.   Pharmacy is Paediatric nurse in [place].   MR, can you please advise? Thanks.

## 2023-02-02 NOTE — Telephone Encounter (Signed)
Wife calling and says PT had a CT done recently and the results are in.  PT would like to speak to Dr. Alfonso Patten or Nurse about getting him something to relive symptoms now that the testing has been done.  Pls call wife @ [phone]  PHARM: [facility]

## 2023-02-03 ENCOUNTER — Encounter: Payer: Self-pay | Admitting: Internal Medicine

## 2023-02-05 ENCOUNTER — Ambulatory Visit: Payer: Medicare Other | Attending: Cardiology | Admitting: Cardiology

## 2023-02-05 ENCOUNTER — Ambulatory Visit: Payer: Medicare Other

## 2023-02-05 ENCOUNTER — Encounter: Payer: Self-pay | Admitting: Cardiology

## 2023-02-05 ENCOUNTER — Telehealth: Payer: Self-pay | Admitting: Internal Medicine

## 2023-02-05 ENCOUNTER — Other Ambulatory Visit: Payer: Medicare Other

## 2023-02-05 VITALS — BP 104/56 | HR 85 | Ht 68.0 in | Wt 200.0 lb

## 2023-02-05 DIAGNOSIS — I25118 Atherosclerotic heart disease of native coronary artery with other forms of angina pectoris: Secondary | ICD-10-CM

## 2023-02-05 DIAGNOSIS — I5022 Chronic systolic (congestive) heart failure: Secondary | ICD-10-CM | POA: Diagnosis not present

## 2023-02-05 DIAGNOSIS — I493 Ventricular premature depolarization: Secondary | ICD-10-CM

## 2023-02-05 DIAGNOSIS — I10 Essential (primary) hypertension: Secondary | ICD-10-CM | POA: Diagnosis not present

## 2023-02-05 MED ORDER — PREDNISONE 10 MG PO TABS: ORAL_TABLET | ORAL | 0 refills | Status: DC

## 2023-02-05 MED ORDER — MEXILETINE HCL 150 MG PO CAPS: 150.0000 mg | ORAL_CAPSULE | Freq: Two times a day (BID) | ORAL | 3 refills | Status: DC

## 2023-02-05 MED ORDER — CEPHALEXIN 500 MG PO CAPS: 500.0000 mg | ORAL_CAPSULE | Freq: Three times a day (TID) | ORAL | 0 refills | Status: DC

## 2023-02-05 NOTE — Telephone Encounter (Signed)
Spoke with Don [name] pt's wife (per DPR) and reviewed Dr. Golden Pop recommendations along with medication education. Don [name] stated understanding and that drainage was colored so ABT was called in as well. Don [name] states pt is already followed by ENT. Nothing further needed at this time.

## 2023-02-05 NOTE — Patient Instructions (Signed)
Medication Instructions:  Your physician has recommended you make the following change in your medication:  START Mexiletine 150 mg twice daily  *If you need a refill on your cardiac medications before your next appointment, please call your pharmacy*   Lab Work: None ordered   Testing/Procedures:                           ZIO XT- Long Term Monitor Instructions  Your physician has requested you wear a ZIO patch monitor for 3 days (you will complete this before your follow up with Dr. Curt Bears).  This is a single patch monitor. [employer] supplies one patch monitor per enrollment. Additional stickers are not available. Please do not apply patch if you will be having a Nuclear Stress Test,  Echocardiogram, Cardiac CT, MRI, or Chest Xray during the period you would be wearing the  monitor. The patch cannot be worn during these tests. You cannot remove and re-apply the  ZIO XT patch monitor.  Your ZIO patch monitor will be mailed 3 day USPS to your address on file. It may take 3-5 days  to receive your monitor after you have been enrolled.  Once you have received your monitor, please review the enclosed instructions. Your monitor  has already been registered assigning a specific monitor serial # to you.  Billing and Patient Assistance Program Information  We have supplied [employer] with any of your insurance information on file for billing purposes. [employer] offers a sliding scale Patient Assistance Program for patients that do not have  insurance, or whose insurance does not completely cover the cost of the ZIO monitor.  You must apply for the Patient Assistance Program to qualify for this discounted rate.  To apply, please call [employer] at [phone], select option 4, select option 2, ask to apply for  Patient Assistance Program. Theodore Demark will ask your household income, and how many people  are in your household. They will quote your out-of-pocket cost based on that information.  [employer]  will also be able to set up a 28-month, interest-free payment plan if needed.  Applying the monitor   Shave hair from upper left chest.  Hold abrader disc by orange tab. Rub abrader in 40 strokes over the upper left chest as  indicated in your monitor instructions.  Clean area with 4 enclosed alcohol pads. Let dry.  Apply patch as indicated in monitor instructions. Patch will be placed under collarbone on left  side of chest with arrow pointing upward.  Rub patch adhesive wings for 2 minutes. Remove white label marked "1". Remove the white  label marked "2". Rub patch adhesive wings for 2 additional minutes.  While looking in a mirror, press and release button in center of patch. A small green light will  flash 3-4 times. This will be your only indicator that the monitor has been turned on.  Do not shower for the first 24 hours. You may shower after the first 24 hours.  Press the button if you feel a symptom. You will hear a small click. Record Date, Time and  Symptom in the Patient Logbook.  When you are ready to remove the patch, follow instructions on the last 2 pages of Patient  Logbook. Stick patch monitor onto the last page of Patient Logbook.  Place Patient Logbook in the blue and white box. Use locking tab on box and tape box closed  securely. The blue and white box has prepaid postage  on it. Please place it in the mailbox as  soon as possible. Your physician should have your test results approximately 7 days after the  monitor has been mailed back to [facility].  Call [facility] at [phone] if you have questions regarding  your ZIO XT patch monitor. Call them immediately if you see an orange light blinking on your  monitor.  If your monitor falls off in less than 4 days, contact our Monitor department at [phone].  If your monitor becomes loose or falls off after 4 days call [employer] at [phone] for  suggestions on securing your  monitor   Follow-Up: At [facility], you and your health needs are our priority.  As part of our continuing mission to provide you with exceptional heart care, we have created designated Provider Care Teams.  These Care Teams include your primary Cardiologist (physician) and Advanced Practice Providers (APPs -  Physician Assistants and Nurse Practitioners) who all work together to provide you with the care you need, when you need it.  Your next appointment:   3 month(s) in [place]  The format for your next appointment:   In Person  Provider:   Allegra Lai, MD    Thank you for choosing [facility]!!   Trinidad Curet, RN [phone]  Other Instructions  Mexiletine Capsules What is this medication? MEXILETINE (mex IL e teen) treats a fast or irregular heartbeat (arrhythmia). It works by slowing down overactive electric signals in the heart, which stabilizes your heart rhythm. It belongs to a group of medications called antiarrhythmics. This medicine may be used for other purposes; ask your health care provider or pharmacist if you have questions. COMMON BRAND NAME(S): Mexitil What should I tell my care team before I take this medication? They need to know if you have any of these conditions: Liver disease Other heart problems Previous heart attack An unusual or allergic reaction to mexiletine, other medications, foods, dyes, or preservatives Pregnant or trying to get pregnant Breast-feeding How should I use this medication? Take this medication by mouth with a glass of water. Follow the directions on the prescription label. It is recommended that you take this medication with food or an antacid. Take your doses at regular intervals. Do not take your medication more often than directed. Do not stop taking except on the advice of your care team. Talk to your care team about the use of this medication in children. Special care may be needed. Overdosage: If you think you  have taken too much of this medicine contact a poison control center or emergency room at once. NOTE: This medicine is only for you. Do not share this medicine with others. What if I miss a dose? If you miss a dose, take it as soon as you can. If it is almost time for your next dose, take only that dose. Do not take double or extra doses. What may interact with this medication? Do not take this medication with any of the following: Dofetilide This medication may also interact with the following: Caffeine Cimetidine Medications for depression, anxiety, or psychotic disturbances Medications to control heart rhythm Phenobarbital Phenytoin Rifampin Theophylline This list may not describe all possible interactions. Give your health care provider a list of all the medicines, herbs, non-prescription drugs, or dietary supplements you use. Also tell them if you smoke, drink alcohol, or use illegal drugs. Some items may interact with your medicine. What should I watch for while using this medication? Your condition  will be monitored closely when you first begin therapy. Often, this medication is first started in a hospital or other monitored health care setting. Once you are on maintenance therapy, visit your care team for regular checks on your progress. Because your condition and use of this medication carry some risk, it is a good idea to carry an identification card, necklace or bracelet with details of your condition, medications, and care team. You may get drowsy or dizzy. Do not drive, use machinery, or do anything that needs mental alertness until you know how this medication affects you. Do not stand or sit up quickly, especially if you are an older patient. This reduces the risk of dizzy or fainting spells. Alcohol can make you more dizzy, increase flushing and rapid heartbeats. Avoid alcoholic drinks. This medication may cause serious skin reactions. They can happen weeks to months after  starting the medication. Contact your care team right away if you notice fevers or flu-like symptoms with a rash. The rash may be red or purple and then turn into blisters or peeling of the skin. Or, you might notice a red rash with swelling of the face, lips or lymph nodes in your neck or under your arms. What side effects may I notice from receiving this medication? Side effects that you should report to your care team as soon as possible: Allergic reactions--skin rash, itching, hives, swelling of the face, lips, tongue, or throat Heart rhythm changes--fast or irregular heartbeat, dizziness, feeling faint or lightheaded, chest pain, trouble breathing Infection--fever, chills, cough, or sore throat Liver injury--right upper belly pain, loss of appetite, nausea, light-colored stool, dark yellow or brown urine, yellowing skin or eyes, unusual weakness or fatigue Rash, fever, and swollen lymph nodes Seizures Unusual bruising or bleeding Side effects that usually do not require medical attention (report to your care team if they continue or are bothersome): Anxiety, nervousness Blurry vision Dizziness Headache Heartburn Nausea Tremors or shaking Vomiting This list may not describe all possible side effects. Call your doctor for medical advice about side effects. You may report side effects to FDA at 1-800-FDA-1088. Where should I keep my medication? Keep out of reach of children. Store at room temperature between 15 and 30 degrees C (59 and 86 degrees F). Throw away any unused medication after the expiration date. NOTE: This sheet is a summary. It may not cover all possible information. If you have questions about this medicine, talk to your doctor, pharmacist, or health care provider.  2023 Elsevier/Gold Standard (2021-05-11 00:00:00)

## 2023-02-05 NOTE — Progress Notes (Unsigned)
Enrolled for [employer] to mail a ZIO XT long term holter monitor to the patients address on file.  

## 2023-02-05 NOTE — Telephone Encounter (Signed)
1) he has elevated eosinophils after I saw him.  I believe I sent out a phone message 01/25/2023 to evaluate him for Dupixent.  I think this will be of benefit to him.  This will be of long-term benefit.  Please ensure pharmacy team is working on that  2) then the same day 01/25/2023 because of his repeated COPD exacerbations on the phone I ordered the CT scan which results have shared right now.  I appreciate the wife sharing that.  He does have chronic sinusitis and this is 1 reason probably why he is having repeated flareups.   Plan  -It is possible the Dupixent can help the sinus issue as well  -He needs referral to ENT  -If he is having colored drainage then he should do another round of antibiotic - cephalexin 500mg  three times daily x  5 days [please make sure no rash with cephalosporins]  -Regardless he should do prednisone:: d do Take prednisone 40 mg daily x 2 days, then 20mg  daily x 2 days, then 10mg  daily x 2 days, then 5mg  daily x 2 days and stop  -He should also over-the-counter saline nasal spray 2 squirts into each nostril twice daily  -  take generic fluticasone inhaler 2 squirts each nostril daily         Allergies  Allergen Reactions   Penicillins Rash and Hives    Has patient had a PCN reaction causing immediate rash, facial/tongue/throat swelling, SOB or lightheadedness with hypotension:unsure Has patient had a PCN reaction causing severe rash involving mucus membranes or skin necrosis:unsure Has patient had a PCN reaction that required hospitalization:No Has patient had a PCN reaction occurring within the last 10 years:No If all of the above answers are "NO", then may proceed with Cephalosporin use.  rash   Bactrim [Sulfamethoxazole-Trimethoprim]     Dizziness, confusion, involuntary movements   Doxycycline Rash   Iodinated Contrast Media Rash    Also developed blisters   Penicillin G Rash   Tramadol Rash    Current Outpatient Medications:    ALPRAZolam  (XANAX) 0.25 MG tablet, TAKE 1 TABLET BY MOUTH ONCE DAILY AS NEEDED FOR ANXIETY, Disp: 30 tablet, Rfl: 2   arformoterol (BROVANA) 15 MCG/2ML NEBU, Take 2 mLs (15 mcg total) by nebulization 2 (two) times daily., Disp: 120 mL, Rfl: 6   budesonide (PULMICORT) 0.25 MG/2ML nebulizer solution, Take 2 mLs (0.25 mg total) by nebulization in the morning and at bedtime., Disp: 60 mL, Rfl: 11   clopidogrel (PLAVIX) 75 MG tablet, Take 1 tablet by mouth once daily, Disp: 90 tablet, Rfl: 0   cyanocobalamin (VITAMIN B12) 1000 MCG/ML injection, INJECT 1 ML (CC) INTRAMUSCULARLY ONCE A WEEK, Disp: 10 mL, Rfl: 0   cyclobenzaprine (FLEXERIL) 10 MG tablet, Take 1 tablet (10 mg total) by mouth See admin instructions. Take 1 tablet by mouth three times daily as needed for muscle spasm, Disp: 270 tablet, Rfl: 1   DULoxetine (CYMBALTA) 60 MG capsule, Take 1 capsule (60 mg total) by mouth 2 (two) times daily., Disp: 180 capsule, Rfl: 1   fenofibrate 160 MG tablet, Take 1 tablet by mouth once daily, Disp: 90 tablet, Rfl: 0   finasteride (PROSCAR) 5 MG tablet, Take 1 tablet by mouth once daily (Patient taking differently: Take 5 mg by mouth daily.), Disp: 90 tablet, Rfl: 1   furosemide (LASIX) 20 MG tablet, Take 1 tablet (20 mg total) by mouth daily., Disp: 90 tablet, Rfl: 3   gabapentin (NEURONTIN) 400 MG  capsule, TAKE 1 CAPSULE BY MOUTH THREE TIMES DAILY, Disp: 270 capsule, Rfl: 0   glucose blood (ONETOUCH ULTRA) test strip, USE 1 STRIP TO CHECK GLUCOSE IN THE MORNING AND 1 AT BEDTIME AS DIRECTED, Disp: 100 each, Rfl: 3   insulin lispro (HUMALOG) 100 UNIT/ML injection, Inject 0.04 mLs (4 Units total) into the skin 3 (three) times daily with meals., Disp: 10 mL, Rfl: 11   ipratropium-albuterol (DUONEB) 0.5-2.5 (3) MG/3ML SOLN, USE 1 AMPULE IN NEBULIZER 4 TIMES DAILY (Patient taking differently: Take 3 mLs by nebulization See admin instructions. USE 1 AMPULE IN NEBULIZER 4 TIMES DAILY), Disp: 360 mL, Rfl: 1   Lancets (ONETOUCH  DELICA PLUS [id number]) MISC, USE 1  TO CHECK GLUCOSE THREE TIMES DAILY (Patient taking differently: 1 each by Other route See admin instructions. USE 1 TO CHECK GLUCOSE THREE TIMES DAILY), Disp: 100 each, Rfl: 0   levofloxacin (LEVAQUIN) 750 MG tablet, Take 1 tablet (750 mg total) by mouth daily., Disp: 7 tablet, Rfl: 0   levothyroxine (SYNTHROID) 25 MCG tablet, Take 1 tablet by mouth once daily, Disp: 90 tablet, Rfl: 0   losartan (COZAAR) 50 MG tablet, Take 1 tablet (50 mg total) by mouth daily., Disp: 90 tablet, Rfl: 3   magnesium chloride (SLOW-MAG) 64 MG TBEC SR tablet, Take 1 tablet (64 mg total) by mouth daily., Disp: 90 tablet, Rfl: 3   metFORMIN (GLUCOPHAGE) 1000 MG tablet, Take 1 tablet (1,000 mg total) by mouth 2 (two) times daily with a meal., Disp: 180 tablet, Rfl: 2   metoprolol succinate (TOPROL-XL) 100 MG 24 hr tablet, Take 1 tablet (100 mg total) by mouth daily. Take with or immediately following a meal., Disp: 90 tablet, Rfl: 3   mexiletine (MEXITIL) 150 MG capsule, Take 1 capsule (150 mg total) by mouth 2 (two) times daily., Disp: 60 capsule, Rfl: 3   MYRBETRIQ 25 MG TB24 tablet, Take 1 tablet by mouth once daily (Patient taking differently: Take 25 mg by mouth daily.), Disp: 30 tablet, Rfl: 0   nitroGLYCERIN (NITROSTAT) 0.4 MG SL tablet, DISSOLVE ONE TABLET UNDER THE TONGUE EVERY 5 TO 10 MINUTES PRIOR TO ACTIVITIES WHICH MIGHT PRECIPITATE AN ATTACK (Patient taking differently: Place 0.4 mg under the tongue every 5 (five) minutes as needed for chest pain.), Disp: 25 tablet, Rfl: 0   nystatin cream (MYCOSTATIN), Apply 1 application topically 2 (two) times daily as needed for dry skin., Disp: , Rfl:    Omega-3 Fatty Acids (FISH OIL) 1000 MG CAPS, Take 2 capsules (2,000 mg total) by mouth in the morning and at bedtime., Disp: 180 capsule, Rfl: 12   omeprazole (PRILOSEC) 20 MG capsule, Take 1 capsule by mouth once daily (Patient taking differently: Take 20 mg by mouth daily.), Disp: 90  capsule, Rfl: 0   oxyCODONE-acetaminophen (PERCOCET/ROXICET) 5-325 MG tablet, Take 1 tablet by mouth every 6 (six) hours as needed for severe pain., Disp: 120 tablet, Rfl: 0   predniSONE (DELTASONE) 10 MG tablet, 40 mg daily x 2 days, then 20mg  daily x 2 days, then 10mg  daily x 2 days, then 5mg  daily x 2 days and stop, Disp: 15 tablet, Rfl: 0   revefenacin (YUPELRI) 175 MCG/3ML nebulizer solution, Take 3 mLs (175 mcg total) by nebulization daily., Disp: 90 mL, Rfl: 11   roflumilast (DALIRESP) 500 MCG TABS tablet, Take 0.5 tablets (250 mcg total) by mouth daily for 28 days, THEN 1 tablet (500 mcg total) daily., Disp: 30 tablet, Rfl: 11   rosuvastatin (CRESTOR) 40  MG tablet, Take 1 tablet by mouth once daily, Disp: 90 tablet, Rfl: 0   sulfamethoxazole-trimethoprim (BACTRIM DS) 800-160 MG tablet, Take 1 tablet by mouth 2 (two) times daily., Disp: 20 tablet, Rfl: 0   tamsulosin (FLOMAX) 0.4 MG CAPS capsule, TAKE 2 CAPSULES BY MOUTH ONCE DAILY AFTER SUPPER (Patient taking differently: Take 0.8 mg by mouth daily after supper.), Disp: 180 capsule, Rfl: 1   TOUJEO SOLOSTAR 300 UNIT/ML Solostar Pen, INJECT 30 UNITS SUBCUTANEOUSLY ONCE DAILY, Disp: 6 mL, Rfl: 0   traZODone (DESYREL) 150 MG tablet, Take 1 tablet (150 mg total) by mouth daily. (Patient taking differently: Take 75 mg by mouth at bedtime.), Disp: 270 tablet, Rfl: 0   VENTOLIN HFA 108 (90 Base) MCG/ACT inhaler, INHALE 2 PUFFS BY MOUTH EVERY 6 HOURS AS NEEDED FOR SHORTNESS OF BREATH FOR WHEEZING (Patient taking differently: Inhale 2 puffs into the lungs See admin instructions. INHALE 2 PUFFS BY MOUTH EVERY 6 HOURS AS NEEDED FOR SHORTNESS OF BREATH FOR WHEEZING), Disp: 18 g, Rfl: 0   Vitamin D, Ergocalciferol, (DRISDOL) 1.25 MG (50000 UNIT) CAPS capsule, TAKE 1 CAPSULE BY MOUTH TWICE A WEEK, Disp: 10 capsule, Rfl: 0

## 2023-02-05 NOTE — Telephone Encounter (Signed)
Anne Ng wife would like to take patient';s sputum sample to [facility]. Would like lab form sent to [facility] phone number is [phone].

## 2023-02-05 NOTE — Telephone Encounter (Signed)
MR, can you please advise?

## 2023-02-05 NOTE — Progress Notes (Signed)
Electrophysiology Office Note   Date:  02/05/2023   ID:  Don [name], DOB Mar 15, 1951, MRN [id number]  PCP:  [name], MD  Cardiologist:  Bettina Gavia Primary Electrophysiologist:  Christien Berthelot Meredith Leeds, MD    Chief Complaint: PVC   History of Present Illness: Don [name] is a 72 y.o. male who is being seen today for the evaluation of PVC at the request of Bettina Gavia, Hilton Cork, MD. Presenting today for electrophysiology evaluation.  She is in for coronary artery disease, hypertension, hyperlipidemia, emphysema, type 2 diabetes, diabetic polyneuropathy.  He has had an inferior wall MI with coronary angiography showing a CTO of the RCA at the site of the prior stent with good collaterals.  He had a recent echo that showed an ejection fraction of 45 to 50%.  He presented to the emergency room 11/09/2022 with chest pain and shortness of breath and was found to have frequent PVCs with right bundle branch block.  He wore a cardiac monitor that showed a 17% burden of PVCs.  Today, he denies symptoms of palpitations, chest pain, shortness of breath, orthopnea, PND, lower extremity edema, claudication, dizziness, presyncope, syncope, bleeding, or neurologic sequela. The patient is tolerating medications without difficulties.  Main complaint is fatigue, weakness, shortness of breath.  He is unaware of PVCs.  He has no chest pain.  He is not able to do much daily activity due to his shortness of breath and fatigue.  He does have stage IV COPD.  His wife feels that this is quite limiting for him.  He does have sleep apnea and has not been wearing his CPAP.   Past Medical History:  Diagnosis Date   Absence of bladder continence 01/08/2022   Acute bilateral low back pain without sciatica 01/08/2022   Anxiety state 02/02/2022   Arthritis    Asymptomatic LV dysfunction 10/18/2017   B12 deficiency anemia 08/25/2021   Basal cell carcinoma    Benign prostatic hyperplasia with incomplete bladder  emptying 01/14/2019   Blood in ear canal, left 05/13/2021   Bone pain 03/08/2020   Cancer ([place])    throat - 1997, throat - 2018   Cardiomyopathy, secondary ([place]) 12/01/2016   Overview:  EF 47% 12/26/16   Chest pain syndrome 11/02/2017   Chronic coronary artery disease 10/18/2017   Chronic cough 05/16/2022   Chronic ischemic heart disease 11/13/1998   Jan 22, 2003 Entered By: Marland Kitchen J Comment:  massive Mi in 2000 per patient hsitory, 2nd Mi in 2001Mar 11, 2004 Entered By: Marland Kitchen J Comment: Had stent placedin 2000,cath/angio in 2001   Chronic neck pain 05/16/2022   Chronic respiratory failure with hypoxia ([place]) 05/16/2022   CKD (chronic kidney disease) stage 3, GFR 30-59 ml/min (HCC) 07/25/2019   COPD (chronic obstructive pulmonary disease) ([place])    Coronary artery disease    Coronary artery disease involving native coronary artery of native heart with angina pectoris ([place]) 12/01/2016   Overview:  He has hx of IWMI in remote past, last cath in 2012 at [facility] showed chronic total occlusion of previously stented RCA, with good collaterals, a 40% LAD stenosis, inferior hypokinesis and EF 45%.    He's been lost to Cardiology f/u since 2015, but has not had any recurrent events   Deficiency anemia 08/25/2021   Depression    Diabetes mellitus ([place]) 03/08/2020   Diabetic polyneuropathy associated with type 2 diabetes mellitus ([place]) 05/16/2022   Diaphoresis 05/16/2022   Diverticulitis 05/02/2020   Dyslipidemia 12/01/2016   Emphysema  lung ([place])    Erectile dysfunction due to diseases classified elsewhere 06/12/2019   Essential hypertension 10/18/2017   GERD (gastroesophageal reflux disease)    Hoarseness 11/15/2016   Hyperlipidemia    Hypertension    Insomnia 01/28/2021   Iron deficiency anemia due to chronic blood loss 08/25/2021   Laceration of thumb, left 12/17/2015   Left flank pain 03/14/2022   Left lower quadrant abdominal tenderness without rebound tenderness 03/14/2022   Lesion of vocal  cord    Leukoplakia of larynx 11/15/2016   Long-term use of aspirin therapy 10/02/2018   Malfunction of penile prosthesis ([place]) 01/14/2019   Malignant neoplasm of skin 01/28/2021   Formatting of this note might be different from the original. Jan 22, 2003 Entered By: Marland Kitchen J Comment: of skin - removed x2 inpast Jan 22, 2003 Entered By: Marland Kitchen J Comment: of skin - removed x2 inpast   Melanoma of back ([place])    melanoma on back   Memory loss 03/08/2020   Mood disorder in conditions classified elsewhere 02/02/2022   Myalgia 03/08/2020   Myocardial infarction [facility]) 2000   2 stents   Obstructive chronic bronchitis with exacerbation ([place]) 05/16/2022   Oropharyngeal dysphagia 08/12/2018   Other ill-defined and unknown causes of morbidity and mortality 11/13/1993   Formatting of this note might be different from the original. Jan 31, 2008 Entered By: ARMOUR,ROSS B Comment: lumbar, 8/08 Jan 22, 2003 Entered By: Marland Kitchen J Comment: x66yrs in work place  quit 53yrs ago in 2000 Jan 22, 2003 Entered By: Marland Kitchen J Comment: x37yrs in work place  quit 32yrs ago in 2000 Jan 31, 2008 Entered By: Stephannie Peters B Comment: lumbar, 8/08   PAD (peripheral artery disease) ([place]) 10/18/2017   Peripheral vascular disease ([place])    iliac artery clot   Peyronie's disease 06/12/2019   Pharyngoesophageal dysphagia 08/12/2018   Pneumonia 02/2015   Pneumothorax 06/07/2020   Preoperative clearance 04/03/2018   Squamous cell carcinoma of larynx ([place]) 12/13/2016   Testicular pain, left 01/14/2019   Throat cancer ([place])    Tobacco use disorder 06/12/2019   Past Surgical History:  Procedure Laterality Date   ABDOMINAL ANGIOGRAM N/A 01/13/2015   Procedure: ABDOMINAL ANGIOGRAM;  Surgeon: Rosetta Posner, MD;  Location: [facility] CATH LAB;  Service: Cardiovascular;  Laterality: N/A;   APPENDECTOMY     BACK SURGERY     CARDIAC CATHETERIZATION  2000/2012   with stents in [place]     bilateral   ESOPHAGOGASTRODUODENOSCOPY     KNEE SURGERY Left    MICROLARYNGOSCOPY WITH CO2 LASER AND EXCISION OF VOCAL CORD LESION N/A 11/23/2016   Procedure: MICROLARYNGOSCOPY  AND EXCISION OF VOCAL CORD LESION;  Surgeon: Melissa Montane, MD;  Location: [facility];  Service: ENT;  Laterality: N/A;   MICROLARYNGOSCOPY WITH CO2 LASER AND EXCISION OF VOCAL CORD LESION N/A 01/11/2017   Procedure: MICROLARYNGOSCOPY WITH CO2 LASER AND EXCISION OF VOCAL CORD LESION;  Surgeon: Melissa Montane, MD;  Location: [facility];  Service: ENT;  Laterality: N/A;   MICROLARYNGOSCOPY WITH LASER N/A 03/16/2015   Procedure: MICROLARYNGOSCOPY ;  Surgeon: Melissa Montane, MD;  Location: [facility] OR;  Service: ENT;  Laterality: N/A;   peyronie's surgery     SHOULDER SURGERY Right    rotator cuff   SPINE SURGERY  09/2020   cervical surgery. steel plate, bone spurs, allograft.   THROAT SURGERY  1997   cancer removed  Current Outpatient Medications  Medication Sig Dispense Refill   ALPRAZolam (XANAX) 0.25 MG tablet TAKE 1 TABLET BY MOUTH ONCE DAILY AS NEEDED FOR ANXIETY 30 tablet 2   arformoterol (BROVANA) 15 MCG/2ML NEBU Take 2 mLs (15 mcg total) by nebulization 2 (two) times daily. 120 mL 6   budesonide (PULMICORT) 0.25 MG/2ML nebulizer solution Take 2 mLs (0.25 mg total) by nebulization in the morning and at bedtime. 60 mL 11   clopidogrel (PLAVIX) 75 MG tablet Take 1 tablet by mouth once daily 90 tablet 0   cyanocobalamin (VITAMIN B12) 1000 MCG/ML injection INJECT 1 ML (CC) INTRAMUSCULARLY ONCE A WEEK 10 mL 0   cyclobenzaprine (FLEXERIL) 10 MG tablet Take 1 tablet (10 mg total) by mouth See admin instructions. Take 1 tablet by mouth three times daily as needed for muscle spasm 270 tablet 1   DULoxetine (CYMBALTA) 60 MG capsule Take 1 capsule (60 mg total) by mouth 2 (two) times daily. 180 capsule 1   fenofibrate 160 MG tablet Take 1 tablet by mouth once daily 90 tablet 0   finasteride (PROSCAR) 5 MG tablet Take 1 tablet by mouth  once daily (Patient taking differently: Take 5 mg by mouth daily.) 90 tablet 1   furosemide (LASIX) 20 MG tablet Take 1 tablet (20 mg total) by mouth daily. 90 tablet 3   gabapentin (NEURONTIN) 400 MG capsule TAKE 1 CAPSULE BY MOUTH THREE TIMES DAILY 270 capsule 0   glucose blood (ONETOUCH ULTRA) test strip USE 1 STRIP TO CHECK GLUCOSE IN THE MORNING AND 1 AT BEDTIME AS DIRECTED 100 each 3   insulin lispro (HUMALOG) 100 UNIT/ML injection Inject 0.04 mLs (4 Units total) into the skin 3 (three) times daily with meals. 10 mL 11   ipratropium-albuterol (DUONEB) 0.5-2.5 (3) MG/3ML SOLN USE 1 AMPULE IN NEBULIZER 4 TIMES DAILY (Patient taking differently: Take 3 mLs by nebulization See admin instructions. USE 1 AMPULE IN NEBULIZER 4 TIMES DAILY) 360 mL 1   Lancets (ONETOUCH DELICA PLUS 123XX123) MISC USE 1  TO CHECK GLUCOSE THREE TIMES DAILY (Patient taking differently: 1 each by Other route See admin instructions. USE 1 TO CHECK GLUCOSE THREE TIMES DAILY) 100 each 0   levofloxacin (LEVAQUIN) 750 MG tablet Take 1 tablet (750 mg total) by mouth daily. 7 tablet 0   levothyroxine (SYNTHROID) 25 MCG tablet Take 1 tablet by mouth once daily 90 tablet 0   losartan (COZAAR) 50 MG tablet Take 1 tablet (50 mg total) by mouth daily. 90 tablet 3   magnesium chloride (SLOW-MAG) 64 MG TBEC SR tablet Take 1 tablet (64 mg total) by mouth daily. 90 tablet 3   metFORMIN (GLUCOPHAGE) 1000 MG tablet Take 1 tablet (1,000 mg total) by mouth 2 (two) times daily with a meal. 180 tablet 2   metoprolol succinate (TOPROL-XL) 100 MG 24 hr tablet Take 1 tablet (100 mg total) by mouth daily. Take with or immediately following a meal. 90 tablet 3   mexiletine (MEXITIL) 150 MG capsule Take 1 capsule (150 mg total) by mouth 2 (two) times daily. 60 capsule 3   MYRBETRIQ 25 MG TB24 tablet Take 1 tablet by mouth once daily (Patient taking differently: Take 25 mg by mouth daily.) 30 tablet 0   nitroGLYCERIN (NITROSTAT) 0.4 MG SL tablet  DISSOLVE ONE TABLET UNDER THE TONGUE EVERY 5 TO 10 MINUTES PRIOR TO ACTIVITIES WHICH MIGHT PRECIPITATE AN ATTACK (Patient taking differently: Place 0.4 mg under the tongue every 5 (five) minutes as needed for  chest pain.) 25 tablet 0   nystatin cream (MYCOSTATIN) Apply 1 application topically 2 (two) times daily as needed for dry skin.     Omega-3 Fatty Acids (FISH OIL) 1000 MG CAPS Take 2 capsules (2,000 mg total) by mouth in the morning and at bedtime. 180 capsule 12   omeprazole (PRILOSEC) 20 MG capsule Take 1 capsule by mouth once daily (Patient taking differently: Take 20 mg by mouth daily.) 90 capsule 0   oxyCODONE-acetaminophen (PERCOCET/ROXICET) 5-325 MG tablet Take 1 tablet by mouth every 6 (six) hours as needed for severe pain. 120 tablet 0   predniSONE (DELTASONE) 10 MG tablet 40 mg daily x 2 days, then 20mg  daily x 2 days, then 10mg  daily x 2 days, then 5mg  daily x 2 days and stop 15 tablet 0   revefenacin (YUPELRI) 175 MCG/3ML nebulizer solution Take 3 mLs (175 mcg total) by nebulization daily. 90 mL 11   roflumilast (DALIRESP) 500 MCG TABS tablet Take 0.5 tablets (250 mcg total) by mouth daily for 28 days, THEN 1 tablet (500 mcg total) daily. 30 tablet 11   rosuvastatin (CRESTOR) 40 MG tablet Take 1 tablet by mouth once daily 90 tablet 0   sulfamethoxazole-trimethoprim (BACTRIM DS) 800-160 MG tablet Take 1 tablet by mouth 2 (two) times daily. 20 tablet 0   tamsulosin (FLOMAX) 0.4 MG CAPS capsule TAKE 2 CAPSULES BY MOUTH ONCE DAILY AFTER SUPPER (Patient taking differently: Take 0.8 mg by mouth daily after supper.) 180 capsule 1   TOUJEO SOLOSTAR 300 UNIT/ML Solostar Pen INJECT 30 UNITS SUBCUTANEOUSLY ONCE DAILY 6 mL 0   traZODone (DESYREL) 150 MG tablet Take 1 tablet (150 mg total) by mouth daily. (Patient taking differently: Take 75 mg by mouth at bedtime.) 270 tablet 0   VENTOLIN HFA 108 (90 Base) MCG/ACT inhaler INHALE 2 PUFFS BY MOUTH EVERY 6 HOURS AS NEEDED FOR SHORTNESS OF BREATH FOR  WHEEZING (Patient taking differently: Inhale 2 puffs into the lungs See admin instructions. INHALE 2 PUFFS BY MOUTH EVERY 6 HOURS AS NEEDED FOR SHORTNESS OF BREATH FOR WHEEZING) 18 g 0   Vitamin D, Ergocalciferol, (DRISDOL) 1.25 MG (50000 UNIT) CAPS capsule TAKE 1 CAPSULE BY MOUTH TWICE A WEEK 10 capsule 0   No current facility-administered medications for this visit.    Allergies:   Penicillins, Bactrim [sulfamethoxazole-trimethoprim], Doxycycline, Iodinated contrast media, Penicillin g, and Tramadol   Social History:  The patient  reports that he has been smoking cigarettes. He has a 50.00 pack-year smoking history. He has never used smokeless tobacco. He reports that he does not drink alcohol and does not use drugs.   Family History:  The patient's family history includes Cancer in his mother and another family member; Diabetes in an other family member; Healthy in his child; Hypertension in his father and mother; Stroke in his father.    ROS:  Please see the history of present illness.   Otherwise, review of systems is positive for none.   All other systems are reviewed and negative.    PHYSICAL EXAM: VS:  BP (!) 104/56   Pulse 85   Ht 5\' 8"  (1.727 m)   Wt 200 lb (90.7 kg)   SpO2 98%   BMI 30.41 kg/m  , BMI Body mass index is 30.41 kg/m. GEN: Well nourished, well developed, in no acute distress  HEENT: normal  Neck: no JVD, carotid bruits, or masses Cardiac: RRR; no murmurs, rubs, or gallops,no edema  Respiratory:  clear to auscultation bilaterally, normal  work of breathing GI: soft, nontender, nondistended, + BS MS: no deformity or atrophy  Skin: warm and dry Neuro:  Strength and sensation are intact Psych: euthymic mood, full affect  EKG:  EKG is ordered today. Personal review of the ekg ordered shows sinus rhythm, right bundle branch block  Recent Labs: 08/17/2022: Pro B Natriuretic peptide (BNP) 34.0 11/09/2022: B Natriuretic Peptide 216.5; TSH 3.430 11/10/2022:  Magnesium 1.9 01/18/2023: ALT 12; BUN 9; Creatinine, Ser 1.13; Hemoglobin 12.1; Platelets 279; Potassium 4.8; Sodium 139    Lipid Panel     Component Value Date/Time   CHOL 106 01/18/2023 0804   TRIG 267 (H) 01/18/2023 0804   HDL 25 (L) 01/18/2023 0804   CHOLHDL 4.2 01/18/2023 0804   LDLCALC 40 01/18/2023 0804     Wt Readings from Last 3 Encounters:  02/05/23 200 lb (90.7 kg)  01/11/23 196 lb (88.9 kg)  01/04/23 194 lb 6.4 oz (88.2 kg)      Other studies Reviewed: Additional studies/ records that were reviewed today include: TTE 11/10/22  Review of the above records today demonstrates:   1. Left ventricular ejection fraction, by estimation, is 45 to 50%. The  left ventricle has low normal function. The left ventricle has no regional  wall motion abnormalities. Left ventricular diastolic parameters are  indeterminate.   2. Right ventricular systolic function is normal. The right ventricular  size is normal.   3. The mitral valve is normal in structure. Trivial mitral valve  regurgitation. No evidence of mitral stenosis.   4. The aortic valve was not well visualized. Aortic valve regurgitation  is not visualized. No aortic stenosis is present.   5. The inferior vena cava is normal in size with greater than 50%  respiratory variability, suggesting right atrial pressure of 3 mmHg.   Cardiac monitor 12/17/2022 personally reviewed Isolated VEs were frequent (17.4%, [id number]), VE Couplets were rare (<1.0%, 635), and VE Triplets were rare (<1.0%, 236). Ventricular Bigeminy and Trigeminy were present.1 run of Ventricular premature contractions occurred lasting 4 beats with a max rate of 133 bpm (avg 109 bpm).   Myoview 05/24/2022   The study is normal. The study is intermediate risk.   LV perfusion is abnormal. There is no evidence of ischemia. There is evidence of infarction. Defect 1: There is a medium defect with moderate reduction in uptake present in the inferior location(s) that is  fixed. There is abnormal wall motion in the defect area. Consistent with infarction.   Left ventricular function is abnormal. Global function is mildly reduced. Nuclear stress EF: 49 %. The left ventricular ejection fraction is mildly decreased (45-54%). End diastolic cavity size is normal.   Prior study not available for comparison.  ASSESSMENT AND PLAN:  1.  PVCs: Elevated burden at 17.4%.  He feels palpitations, weakness, fatigue.  He has a existing right bundle branch block.  I do think his fatigue and weakness is likely multifactorial, but PVCs could be playing a part.  Shaquanta Harkless start him on mexiletine 150 mg twice daily.  Jencarlos Nicolson get a cardiac monitor prior to his next appointment.  2.  Coronary artery disease: Has chronic stable angina.  Plan per primary cardiology.  3.  Chronic systolic heart failure: Likely due to a combination of prior MI with ischemic cardiomyopathy and potentially PVCs.  Currently on medical therapy per primary cardiology.  4.  Hypertension: Currently well-controlled  5.  Hyperlipidemia: Continue statin per primary cardiology.  6.  Obstructive sleep apnea: CPAP compliance encouraged  Current medicines are reviewed at length with the patient today.   The patient does not have concerns regarding his medicines.  The following changes were made today: Start mexiletine  Labs/ tests ordered today include:  Orders Placed This Encounter  Procedures   LONG TERM MONITOR (3-14 DAYS)   EKG 12-Lead     Disposition:   FU with Deone Omahoney 3 months  Signed, Kavya Haag Meredith Leeds, MD  02/05/2023 11:06 AM     [address] [place] [address] [phone] (office) [phone] (fax)

## 2023-02-05 NOTE — Telephone Encounter (Signed)
Pt's wife informed pt's blood sugar is everywhere, on steroids from pulmonology. This morning blood sugar is 213, wondering if he can have coverage in between, pt is asympotomatic, asked to go to ED if sugar higher than 380

## 2023-02-06 NOTE — Telephone Encounter (Signed)
I do not see order for sputum sample. Called the pt's spouse and there was no answer- LMTCB.

## 2023-02-07 ENCOUNTER — Other Ambulatory Visit: Payer: Self-pay | Admitting: Family Medicine

## 2023-02-09 ENCOUNTER — Other Ambulatory Visit: Payer: Self-pay | Admitting: Family Medicine

## 2023-02-09 ENCOUNTER — Telehealth: Payer: Self-pay | Admitting: Cardiology

## 2023-02-09 NOTE — Telephone Encounter (Signed)
Pt c/o medication issue:  1. Name of Medication:  mexiletine (MEXITIL) 150 MG capsule  2. How are you currently taking this medication (dosage and times per day)?  Twice daily with food  3. Are you having a reaction (difficulty breathing--STAT)?   4. What is your medication issue?   Patient's wife states this medication is causing shakiness.

## 2023-02-09 NOTE — Telephone Encounter (Signed)
Pt seen in office by Camn[place] on 02/05/23: 1.  PVCs: Elevated burden at 17.4%.  He feels palpitations, weakness, fatigue.  He has a existing right bundle branch block.  I do think his fatigue and weakness is likely multifactorial, but PVCs could be playing a part.  Will start him on mexiletine 150 mg twice daily.  Will get a cardiac monitor prior to his next appointment.   States staggering, confused, had intermittent whole body tremors, but main issue was his hands shaking, States "he couldn't hold onto his cup." Patient did start medication on 02/06/23, no issues until today, roughly two hours ago. Patient had taken a nap and woke up with this. Episode lasted about an hour and 20 minutes. During episode, wife states he was able to talk appropriately, walk, no acute neurologic issues, and denies any vision changes, "he just couldn't hold anything." No history of seizure disorder. Wife checked vitals: 146/60, HR 72, o2=93%. States his glucose was 409 (he's on steroids by pulmonology) and wife gave him his insulin. Advised her to keep him on the medication and we would contact MD for advice. She agrees to plan.

## 2023-02-13 MED ORDER — DUPIXENT 300 MG/2ML ~~LOC~~ SOAJ: SUBCUTANEOUS | 1 refills | Status: DC

## 2023-02-13 NOTE — Telephone Encounter (Signed)
Rx for Dupixent sent to [facility] today electronically with notation to notify [employer].  Spoke with patient's wife - she will plan to cal [employer] today to express financial hardship with her husband. Advised of copay. Provided her with [employer] phone number.  Knox Saliva, PharmD, MPH, BCPS, CPP Clinical Pharmacist (Rheumatology and Pulmonology)

## 2023-02-15 ENCOUNTER — Ambulatory Visit: Payer: Medicare Other | Admitting: Internal Medicine

## 2023-02-15 ENCOUNTER — Encounter: Payer: Self-pay | Admitting: Internal Medicine

## 2023-02-15 ENCOUNTER — Ambulatory Visit (INDEPENDENT_AMBULATORY_CARE_PROVIDER_SITE_OTHER): Payer: Medicare Other

## 2023-02-15 VITALS — BP 108/60 | HR 85 | Ht 70.0 in | Wt 198.0 lb

## 2023-02-15 DIAGNOSIS — J441 Chronic obstructive pulmonary disease with (acute) exacerbation: Secondary | ICD-10-CM | POA: Diagnosis not present

## 2023-02-15 DIAGNOSIS — F17200 Nicotine dependence, unspecified, uncomplicated: Secondary | ICD-10-CM

## 2023-02-15 DIAGNOSIS — J449 Chronic obstructive pulmonary disease, unspecified: Secondary | ICD-10-CM

## 2023-02-15 DIAGNOSIS — J329 Chronic sinusitis, unspecified: Secondary | ICD-10-CM | POA: Diagnosis not present

## 2023-02-15 DIAGNOSIS — R490 Dysphonia: Secondary | ICD-10-CM | POA: Diagnosis not present

## 2023-02-15 DIAGNOSIS — J9610 Chronic respiratory failure, unspecified whether with hypoxia or hypercapnia: Secondary | ICD-10-CM | POA: Diagnosis not present

## 2023-02-15 DIAGNOSIS — R0600 Dyspnea, unspecified: Secondary | ICD-10-CM | POA: Diagnosis not present

## 2023-02-15 LAB — BRAIN NATRIURETIC PEPTIDE: Pro B Natriuretic peptide (BNP): 70 pg/mL (ref 0.0–100.0)

## 2023-02-15 NOTE — [place] Notes (Signed)
IOV 04/05/2018  Chief Complaint  Patient presents with   Consult    Referred by Dr. [name] due to COPD. Pt was diagnosed in 2017 and states symptoms have become worse. Pt does have c/o SOB with exertion and also cough first thing in morning.    Don [name] 72 y.o. male from [place] [id number] -presents for new evaluation.  His wife and that is a good historian and gives most of the history.  History is gained from him and review of the old chart.  I also visualized the CT scan images from April 2019 and all the way back through April 2017 personally and agree with the final report  He is an ongoing smoker with a previous history of throat cancer and also chronic systolic heart failure status post cardiac stent for many many years with a stable ejection fraction of 35% being followed at [facility].  For the last 3 years he has had worsening shortness of breath.  Initially given a diagnosis of COPD not otherwise specified.  Wife says that he has had progressive shortness of breath since then.  His maintenance inhaler the Spiriva but he is also on Pulmicort nebulizers twice daily which is not mentioned in his med list.  For the last 3 years except for the year 2018 he has had admissions once a year for COPD exacerbation.  He also goes multiple rounds of prednisone and antibiotics for outpatient COPD exacerbation.  Most recent prednisone antibiotic was in December 2018.  They are frustrated with the repeated flareups.  Shortness of breath is associate with cough and wheezing.  This class III in exertion severity.  Walking from the bedroom to the porch makes him short of breath and relieved by rest there is no associated chest pain.  Also this past year he has had increasing unsteadiness of his lower extremities which is attributed to back issues and apparently he has been to neurology and spine surgery and a spine surgeon is having a plan for undergoing to our spinal surgery  and  [facility].  They are asking about preoperative respiratory assessment as well.     Walking desaturation test today.  We normally 285 feet x 3 laps on room air: He only did one lap.  He needed 2 people to walk with him.  He was extremely unsteady on his lower extremities.  He did not desaturate he just became tachycardic.  Pulse ox was held up in the high 90s   Chronic obstructive pulmonary disease, unspecified COPD type ([place])  - copd is stable at this point - continue o2 at night - continue spiriva daily - I recommend adding breo to the regimen scheduled daily - recommend using duoneb as needed  - recommend holding off pulmicort - recommend you talk to cadiologist and change lisinopril to another class; and see if cough improved - later at followu[p can discuss role of roflumilast in preventing flareups - ideally you also need pulmonary rehab but right now wobbly legs need to be addressed first  Preoperative respiratory examination - Moderate risk for any pulmonary complication such as pneumonia following surgery - Low moderate risk for prolonged ventilator dependence  - At time of surgery ideal is that he is flare up free for a month and has quit smoking for a month - this helps reduce risk further  Physical deconditioning  - this is a major issue more than copd causing you to be  fatigued and winded - only sorting out the back can improve this  Multiple lung nodules on CT - stable April 2017 - April 2019 - followup per throat cancer doctor I suppose   Followup  - 6-8 weeks to see response   OV 08/12/2018     HPI Don [name] 72 y.o. -presents for COPD follow-up.  Presents with his wife.  Since his last visit he has had shoulder and spine surgery successfully.  He continues to smoke however.  In the last 1 month wife is reporting deterioration in cough wheezing and dyspnea and chest tightness.  Sputum is changed from white in color to yellow-brown.   She is very frustrated by the deterioration.  She agrees that lisinopril can be contributing to cough because she herself suffered from that.  Nevertheless she is frustrated extremely.  She is asking if any medicines can prevent COPD exacerbations.  Review of the chart indicates April 2019 he did have CT scan of the chest which showed chronic stable nodules.  He has throat cancer.  He has some occasional dysphagia.  The wife is wondering if it is because of thick mucus.  Apparently his esophagus has been stretch.  ENT appointment is pending today.  CAT score is worse at 27.  He continues to smoke.   08/17/2022: OV with [name],NP for follow-up visit.  Last seen November 2020.  Underlying COPD.  Currently on Breo and Spiriva.  Having difficulty using his inhalers.  Does have some memory impairment.  Also feels like he cannot get enough of the medicine out of the inhalers.  Just feels like his breathing is gotten slowly worse over the last couple years.  Gets short of breath with minimal activities.  Lives a relatively sedentary lifestyle.  Does continue to smoke.  Discussed smoking cessation.  He is on 2 L of oxygen at bedtime.  Spirometry in the office showed FEV1 of 31% and ratio 42.  Formal PFTs ordered for further evaluation.  Changed to triple therapy nebulizer regimen to see if we can have better control over his symptoms.  Pulmonary nodules identified on previous CT scan.  Plan for repeat-ordered at visit.  11/02/2022: Today-follow-up Patient presents today for follow-up after undergoing pulmonary function testing.  He has severe obstructive defect with reversibility and severe diffusion defect.  He has been doing a little bit better with triple therapy regimen.  Feels like he is actually getting the medicine and it opens up his chest little bit.  Continues to have daily symptoms of dyspnea, which limit his activities.  He also continues to have daily symptoms of productive cough with white to brown  sputum.  This is baseline for him.  He has had multiple exacerbations over the past year requiring antibiotics and steroids.  His wife wants to know if we can put him on something to help prevent future flareups.  No increased chest congestion or wheezing.  No hemoptysis, weight loss or anorexia.  CT scan results are not available yet.  He does continue to smoke daily; down to 4 cigarettes a day.  He has tried multiple different things in the past to help him quit without any benefit.  OV 01/04/2023  Subjective:  Patient ID: Don [name], male , DOB: [birth year] , age 17 y.o. , MRN: [id number] , ADDRESS: [address] [place] [address] PCP [name], MD Patient Care Team: [name], MD as PCP - General (Family Medicine) Revankar, Reita Cliche, MD as PCP -  Cardiology (Cardiology) Richardo Priest, MD as Referring Physician (Cardiology) Melissa Montane, MD as Consulting Physician (Otolaryngology) Lane Hacker, [facility] as Pharmacist (Pharmacist)  This Provider for this visit: Treatment Team:  Attending Provider: Brand Males, MD    01/04/2023 -   Chief Complaint  Patient presents with   Follow-up    Patient states he can't breathe. Patient coughing up yellow and white phlegm.     HPI Carleton R Regino 72 y.o. -returns for follow-up.  I personally not seen him in a few years.  He has been seeing nurse practitioner multiple times in 2023.  According to him and his wife.  Wife is acting as an independent historian he had multiple exacerbations at least 7 or 9 in 2023 is requiring prednisone and antibiotics.  They insist that Levaquin and prednisone each for 7 days is the only thing that works for him.  They will not accept any other antibiotic therapy.  For this routine visit there endorsing that he is feeling worse for the last 2 days they feel there is a flareup.  They feel there is an antibiotic and prednisone indicated.  They specifically want Levaquin and prednisone.  Wife also  believes that he might be aspirating and this might be a risk factor.  Review of the records indicate that he had high blood eosinophils in the past.  In the past he has been on Daliresp to prevent exacerbations this apparently worked well but it is expensive.  He never been on azithromycin.  Never been on Dupixent.    CT Chest data 10/31/22  Narrative & Impression  CLINICAL DATA:  COPD, dyspnea on exertion. History of throat cancer and diabetes.   EXAM: CT CHEST WITHOUT CONTRAST   TECHNIQUE: Multidetector CT imaging of the chest was performed following the standard protocol without IV contrast.   RADIATION DOSE REDUCTION: This exam was performed according to the departmental dose-optimization program which includes automated exposure control, adjustment of the mA and/or kV according to patient size and/or use of iterative reconstruction technique.   COMPARISON:  CT 04/30/2020.  Radiographs 08/17/2022.   FINDINGS: Cardiovascular: Atherosclerosis of the aorta, great vessels and coronary arteries. No acute vascular findings are seen on noncontrast imaging. The heart size is normal. There is stable anterior pericardial thickening versus a small amount of pericardial fluid.   Mediastinum/Nodes: There are no enlarged mediastinal, hilar or axillary lymph nodes.Hilar assessment is limited by the lack of intravenous contrast, although the hilar contours appear unchanged. The thyroid gland, trachea and esophagus demonstrate no significant findings.   Lungs/Pleura: No pleural effusion or pneumothorax. Moderate centrilobular and paraseptal emphysema with stable biapical scarring. Multiple predominately right-sided pulmonary nodules are again noted, including and densely calcified right lower lobe granuloma. The new right upper lobe nodule seen on the most recent CT is stable over the interval, measuring 4 mm on image 23/5. There is new mild clustered nodularity in the left lower  lobe on axial images 70 through 76, likely postinflammatory or mucous impacted terminal bronchioles. No airspace disease.   Upper abdomen: No significant findings are seen within the visualized upper abdomen. There are scattered splenic granulomas. Mildly prominent ingested material in the stomach. Previous cholecystectomy.   Musculoskeletal/Chest wall: There is no chest wall mass or suspicious osseous finding. Mild spondylosis post lower cervical fusion.   IMPRESSION: 1. No acute chest findings identified. 2. New mild clustered nodularity in the left lower lobe, likely postinflammatory or mucous impacted terminal bronchioles. No airspace disease or  suspicious nodularity. 3. Additional scattered small pulmonary nodules are unchanged from priors, consistent with a benign etiology. 4. Sequela of prior granulomatous disease with calcified right lower lobe granuloma and scattered splenic granulomas. 5. Aortic Atherosclerosis (ICD10-I70.0) and Emphysema (ICD10-J43.9).     Electronically Signed   By: Richardean Sale M.D.   On: 11/03/2022 16:58        Latest Reference Range & Units 12/16/20 09:13 11/02/21 14:00 03/14/22 09:43 05/19/22 08:23 09/29/22 12:39  EOS (ABSOLUTE) 0.0 - 0.4 x10E3/uL 0.3 0.3 0.3 0.3 0.2       OV 02/15/2023  Subjective:  Patient ID: Don [name], male , DOB: 1951-07-02 , age 71 y.o. , MRN: [id number] , ADDRESS: [place] [address] PCP [name], MD Patient Care Team: [name], MD as PCP - General (Family Medicine) Revankar, Reita Cliche, MD as PCP - Cardiology (Cardiology) Constance Haw, MD as PCP - Electrophysiology (Cardiology) Richardo Priest, MD as Referring Physician (Cardiology) Melissa Montane, MD as Consulting Physician (Otolaryngology) Lane Hacker, [facility] as Pharmacist (Pharmacist)  This Provider for this visit: Treatment Team:  Attending Provider: Melvenia Needles, NP    02/15/2023 -   Chief Complaint   Patient presents with   Follow-up    Thick phlegm, wife wants Dr to hear lungs.     #Gold stage III COPD with nocturnal oxygen use #COPD frequent exacerbated. # Chronic systolic dysfunction December 2023 EF 45% # Ongoing 50 pack smoker # History of throat cancer # Chronic sinusitis seen on CT scan of the sinus March 2024 at [facility] # Mild chronic elevations in blood eosinophil counts  HPI Don [name] 71 y.o. -here to review results and also update on progress.  Since last seeing me including the last visit he has had 4 rounds of antibiotics and prednisone of which 2 was Levaquin 1 with Bactrim and 1 with Keflex please.  Most recent antibiotic completion was yesterday most recent prednisone completion was yesterday.  The wife is frustrated and he is also frustrated.  None of these treatment rounds are helping.  The phlegm is very thick.  He has to cough to bring the phlegm out.  Vibratory vest helped him.  He chokes because of the thick phlegm.  Most of the time it is white but sometimes it is green-yellow brown.  They try to give sputum sample but I believe the sputum was not sent because there was no order.  She is asking for a chest x-ray at this point in time.  December 2023 CT scan of the chest did not show any lung cancer.  She did have CT sinus March 2024 at [facility].  I do not have the images but it shows pansinusitis.  Wife is able to get ENT appointment mid April 2024 at [facility] in [place].  He has a history of throat cancer and has been seen by ENT in the past.  He continues to have hoarse voice.  He also has elevated eosinophils we prescribed Dupixent but this is yet to start.  Because of the high co-pay to try to get it through charity.  Had    CT SINUS MARCH 2024    IMPRESSION: Chronic frontal, ethmoid, sphenoid and a maxillary sinusitis. There are no air-fluid levels. Postsurgical changes are noted in maxillary and ethmoid sinuses.  Cortical sulci in the brain  appear prominent suggesting atrophy. Scattered arterial calcifications are seen.   Electronically Signed By: Elmer Picker M.D. On: 02/01/2023 09:48  Latest Reference Range & Units 08/17/22 10:15 01/04/23 12:08  Eosinophils Absolute 0.0 - 0.7 K/uL 0.3 0.3    PFT     Latest Ref Rng & Units 10/26/2022   10:46 AM  PFT Results  FVC-Pre L 2.32   FVC-Predicted Pre % 58   FVC-Post L 2.84   FVC-Predicted Post % 71   Pre FEV1/FVC % % 48   Post FEV1/FCV % % 44   FEV1-Pre L 1.11   FEV1-Predicted Pre % 38   FEV1-Post L 1.24   DLCO uncorrected ml/min/mmHg 9.16   DLCO UNC% % 38   DLCO corrected ml/min/mmHg 9.59   DLCO COR %Predicted % 40   DLVA Predicted % 48       Latest Reference Range & Units 01/04/23 12:08  A-1 Antitrypsin, Ser 83 - 199 mg/dL 143  ALPHA-1 ANTITRYPSIN PHENOTYPE  Rpt  WBC 4.0 - 10.5 K/uL 11.8 (H)  RBC 4.22 - 5.81 Mil/uL 4.83  Hemoglobin 13.0 - 17.0 g/dL 12.1 (L)  HCT 39.0 - 52.0 % 36.5 (L)  MCV 78.0 - 100.0 fl 75.5 (L)  MCHC 30.0 - 36.0 g/dL 33.1  RDW 11.5 - 15.5 % 16.4 (H)  Platelets 150.0 - 400.0 K/uL 233.0  Neutrophils 43.0 - 77.0 % 61.5  Lymphocytes 12.0 - 46.0 % 26.6  Monocytes Relative 3.0 - 12.0 % 9.3  Eosinophil 0.0 - 5.0 % 2.3  Basophil 0.0 - 3.0 % 0.3  NEUT# 1.4 - 7.7 K/uL 7.3  Lymphocyte # 0.7 - 4.0 K/uL 3.1  Monocyte # 0.1 - 1.0 K/uL 1.1 (H)  Eosinophils Absolute 0.0 - 0.7 K/uL 0.3  Basophils Absolute 0.0 - 0.1 K/uL 0.0  Class Description Allergens  Comment  D Pteronyssinus IgE Class 0 kU/L <0.10  D Farinae IgE Class 0 kU/L <0.10  Cat Dander IgE Class 0 kU/L <0.10  Dog Dander IgE Class 0 kU/L <0.10  Penicillium Chrysogen IgE Class 0 kU/L <0.10  Cladosporium Herbarum IgE Class 0 kU/L <0.10  Aspergillus Fumigatus IgE Class 0 kU/L <0.10  Mucor Racemosus IgE Class 0 kU/L <0.10  Alternaria Alternata IgE Class 0 kU/L <0.10  Stemphylium Herbarum IgE Class 0 kU/L <0.10  Goose Feathers IgE Class 0 kU/L <0.10  Chicken Feathers IgE  Class 0 kU/L <0.10  Duck Feathers IgE Class 0 kU/L <0.10  Mouse Urine IgE Class 0 kU/L <0.10  Candida Albicans IgE Class 0 kU/L <0.10  Setomelanomma Rostrat Class 0 kU/L <0.10  Aureobasidi Pullulans IgE Class 0 kU/L <0.10  Phoma Betae IgE Class 0 kU/L <0.10  ALPHA-1-ANTITRYPSIN (AAT) PHENOTYPE  SEE NOTE  Cow Dander IgE Class 0 kU/L <0.10  (H): Data is abnormally high (L): Data is abnormally low Rpt: View report in Results Review for more information    has a past medical history of Absence of bladder continence (01/08/2022), Acute bilateral low back pain without sciatica (01/08/2022), Anxiety state (02/02/2022), Arthritis, Asymptomatic LV dysfunction (10/18/2017), B12 deficiency anemia (08/25/2021), Basal cell carcinoma, Benign prostatic hyperplasia with incomplete bladder emptying (01/14/2019), Blood in ear canal, left (05/13/2021), Bone pain (03/08/2020), Cancer, Cardiomyopathy, secondary (12/01/2016), Chest pain syndrome (11/02/2017), Chronic coronary artery disease (10/18/2017), Chronic cough (05/16/2022), Chronic ischemic heart disease (11/13/1998), Chronic neck pain (05/16/2022), Chronic respiratory failure with hypoxia (05/16/2022), CKD (chronic kidney disease) stage 3, GFR 30-59 ml/min (07/25/2019), COPD (chronic obstructive pulmonary disease), Coronary artery disease, Coronary artery disease involving native coronary artery of native heart with angina pectoris (12/01/2016), Deficiency anemia (08/25/2021), Depression, Diabetes mellitus (03/08/2020), Diabetic polyneuropathy associated with type  2 diabetes mellitus (05/16/2022), Diaphoresis (05/16/2022), Diverticulitis (05/02/2020), Dyslipidemia (12/01/2016), Emphysema lung, Erectile dysfunction due to diseases classified elsewhere (06/12/2019), Essential hypertension (10/18/2017), GERD (gastroesophageal reflux disease), Hoarseness (11/15/2016), Hyperlipidemia, Hypertension, Insomnia (01/28/2021), Iron deficiency anemia due to chronic blood loss (08/25/2021),  Laceration of thumb, left (12/17/2015), Left flank pain (03/14/2022), Left lower quadrant abdominal tenderness without rebound tenderness (03/14/2022), Lesion of vocal cord, 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), Long-term use of aspirin therapy (10/02/2018), Malfunction of penile prosthesis (01/14/2019), Malignant neoplasm of skin (01/28/2021), Melanoma of back, Memory loss (03/08/2020), Mood disorder in conditions classified elsewhere (02/02/2022), Myalgia (03/08/2020), Myocardial infarction (2000), Obstructive chronic bronchitis with exacerbation (05/16/2022), Oropharyngeal dysphagia (08/12/2018), Other ill-defined and unknown causes of morbidity and mortality (11/13/1993), PAD (peripheral artery disease) (10/18/2017), Peripheral vascular disease, Peyronie's disease (06/12/2019), Pharyngoesophageal dysphagia (08/12/2018), Pneumonia (02/2015), Pneumothorax (06/07/2020), Preoperative clearance (04/03/2018), Squamous cell carcinoma of larynx (12/13/2016), Testicular pain, left (01/14/2019), Throat cancer, and Tobacco use disorder (06/12/2019).   reports that he has been smoking cigarettes. He has a 50.00 pack-year smoking history. He has never used smokeless tobacco.  Past Surgical History:  Procedure Laterality Date   ABDOMINAL ANGIOGRAM N/A 01/13/2015   Procedure: ABDOMINAL ANGIOGRAM;  Surgeon: Rosetta Posner, MD;  Location: [facility] CATH LAB;  Service: Cardiovascular;  Laterality: N/A;   APPENDECTOMY     BACK SURGERY     CARDIAC CATHETERIZATION  2000/2012   with stents in [place]     bilateral   ESOPHAGOGASTRODUODENOSCOPY     KNEE SURGERY Left    MICROLARYNGOSCOPY WITH CO2 LASER AND EXCISION OF VOCAL CORD LESION N/A 11/23/2016   Procedure: MICROLARYNGOSCOPY  AND EXCISION OF VOCAL CORD LESION;  Surgeon: Melissa Montane, MD;  Location: [place];  Service: ENT;  Laterality: N/A;   MICROLARYNGOSCOPY WITH CO2 LASER AND EXCISION OF VOCAL CORD LESION N/A 01/11/2017   Procedure:  MICROLARYNGOSCOPY WITH CO2 LASER AND EXCISION OF VOCAL CORD LESION;  Surgeon: Melissa Montane, MD;  Location: [place];  Service: ENT;  Laterality: N/A;   MICROLARYNGOSCOPY WITH LASER N/A 03/16/2015   Procedure: MICROLARYNGOSCOPY ;  Surgeon: Melissa Montane, MD;  Location: [facility] OR;  Service: ENT;  Laterality: N/A;   peyronie's surgery     SHOULDER SURGERY Right    rotator cuff   SPINE SURGERY  09/2020   cervical surgery. steel plate, bone spurs, allograft.   THROAT SURGERY  1997   cancer removed    Allergies  Allergen Reactions   Penicillins Rash and Hives    Has patient had a PCN reaction causing immediate rash, facial/tongue/throat swelling, SOB or lightheadedness with hypotension:unsure Has patient had a PCN reaction causing severe rash involving mucus membranes or skin necrosis:unsure Has patient had a PCN reaction that required hospitalization:No Has patient had a PCN reaction occurring within the last 10 years:No If all of the above answers are "NO", then may proceed with Cephalosporin use.  rash   Bactrim [Sulfamethoxazole-Trimethoprim]     Dizziness, confusion, involuntary movements   Doxycycline Rash   Iodinated Contrast Media Rash    Also developed blisters   Penicillin G Rash   Tramadol Rash    Immunization History  Administered Date(s) Administered   COVID-19, mRNA, vaccine(Comirnaty)12 years and older 09/22/2022   Fluad Quad(high Dose 65+) 08/16/2020, 09/01/2021, 08/17/2022   H1N1 09/13/2008   Influenza Split 09/01/2021   Influenza, High Dose Seasonal PF 10/13/2017, 08/26/2019, 08/16/2020   Influenza-Unspecified 11/13/2002, 08/19/2004, 09/13/2004, 09/13/2006, 10/17/2007, 08/12/2008, 10/13/2017, 08/26/2019  Moderna SARS-COV2 Booster Vaccination 10/12/2020   Moderna Sars-Covid-2 Vaccination 10/12/2020   PNEUMOCOCCAL CONJUGATE-20 01/11/2023   Pneumococcal Conjugate-13 03/13/2000, 10/01/2015   Pneumococcal Polysaccharide-23 11/13/2002, 08/13/2009   Pneumococcal-Unspecified  03/13/2000   Td 11/13/1996   Td (Adult),5 Lf Tetanus Toxid, Preservative Free 11/13/1996    Family History  Problem Relation Age of Onset   Cancer Mother        bone   Hypertension Mother    Stroke Father    Hypertension Father    Healthy Child    Cancer Other        brain   Diabetes Other      Current Outpatient Medications:    ALPRAZolam (XANAX) 0.25 MG tablet, TAKE 1 TABLET BY MOUTH ONCE DAILY AS NEEDED FOR ANXIETY, Disp: 30 tablet, Rfl: 2   arformoterol (BROVANA) 15 MCG/2ML NEBU, Take 2 mLs (15 mcg total) by nebulization 2 (two) times daily., Disp: 120 mL, Rfl: 6   budesonide (PULMICORT) 0.25 MG/2ML nebulizer solution, Take 2 mLs (0.25 mg total) by nebulization in the morning and at bedtime., Disp: 60 mL, Rfl: 11   cephALEXin (KEFLEX) 500 MG capsule, Take 1 capsule (500 mg total) by mouth 3 (three) times daily., Disp: 15 capsule, Rfl: 0   clopidogrel (PLAVIX) 75 MG tablet, Take 1 tablet by mouth once daily, Disp: 90 tablet, Rfl: 0   cyanocobalamin (VITAMIN B12) 1000 MCG/ML injection, INJECT 1 ML (CC) INTRAMUSCULARLY ONCE A WEEK, Disp: 10 mL, Rfl: 0   cyclobenzaprine (FLEXERIL) 10 MG tablet, Take 1 tablet (10 mg total) by mouth See admin instructions. Take 1 tablet by mouth three times daily as needed for muscle spasm, Disp: 270 tablet, Rfl: 1   DULoxetine (CYMBALTA) 60 MG capsule, Take 1 capsule (60 mg total) by mouth 2 (two) times daily., Disp: 180 capsule, Rfl: 1   Dupilumab (DUPIXENT) 300 MG/2ML SOPN, Inject 600mg  into the skin at Week 0 then 300mg  every 14 days thereafter., Disp: 12 mL, Rfl: 1   fenofibrate 160 MG tablet, Take 1 tablet by mouth once daily, Disp: 90 tablet, Rfl: 0   finasteride (PROSCAR) 5 MG tablet, Take 1 tablet by mouth once daily (Patient taking differently: Take 5 mg by mouth daily.), Disp: 90 tablet, Rfl: 1   furosemide (LASIX) 20 MG tablet, Take 1 tablet (20 mg total) by mouth daily., Disp: 90 tablet, Rfl: 3   gabapentin (NEURONTIN) 400 MG capsule, TAKE  1 CAPSULE BY MOUTH THREE TIMES DAILY, Disp: 270 capsule, Rfl: 0   glucose blood (ONETOUCH ULTRA) test strip, USE 1 STRIP TO CHECK GLUCOSE IN THE MORNING AND 1 AT BEDTIME AS DIRECTED, Disp: 100 each, Rfl: 3   insulin lispro (HUMALOG) 100 UNIT/ML injection, Inject 0.04 mLs (4 Units total) into the skin 3 (three) times daily with meals., Disp: 10 mL, Rfl: 11   ipratropium-albuterol (DUONEB) 0.5-2.5 (3) MG/3ML SOLN, USE 1 AMPULE IN NEBULIZER 4 TIMES DAILY (Patient taking differently: Take 3 mLs by nebulization See admin instructions. USE 1 AMPULE IN NEBULIZER 4 TIMES DAILY), Disp: 360 mL, Rfl: 1   Lancets (ONETOUCH DELICA PLUS [id number]) MISC, USE 1  TO CHECK GLUCOSE THREE TIMES DAILY (Patient taking differently: 1 each by Other route See admin instructions. USE 1 TO CHECK GLUCOSE THREE TIMES DAILY), Disp: 100 each, Rfl: 0   levofloxacin (LEVAQUIN) 750 MG tablet, Take 1 tablet (750 mg total) by mouth daily., Disp: 7 tablet, Rfl: 0   levothyroxine (SYNTHROID) 25 MCG tablet, Take 1 tablet by mouth once daily, Disp:  90 tablet, Rfl: 0   losartan (COZAAR) 50 MG tablet, Take 1 tablet (50 mg total) by mouth daily., Disp: 90 tablet, Rfl: 3   magnesium chloride (SLOW-MAG) 64 MG TBEC SR tablet, Take 1 tablet (64 mg total) by mouth daily., Disp: 90 tablet, Rfl: 3   metFORMIN (GLUCOPHAGE) 1000 MG tablet, Take 1 tablet (1,000 mg total) by mouth 2 (two) times daily with a meal., Disp: 180 tablet, Rfl: 2   metoprolol succinate (TOPROL-XL) 100 MG 24 hr tablet, Take 1 tablet (100 mg total) by mouth daily. Take with or immediately following a meal., Disp: 90 tablet, Rfl: 3   mexiletine (MEXITIL) 150 MG capsule, Take 1 capsule (150 mg total) by mouth 2 (two) times daily., Disp: 60 capsule, Rfl: 3   MYRBETRIQ 25 MG TB24 tablet, Take 1 tablet by mouth once daily (Patient taking differently: Take 25 mg by mouth daily.), Disp: 30 tablet, Rfl: 0   nitroGLYCERIN (NITROSTAT) 0.4 MG SL tablet, DISSOLVE ONE TABLET UNDER THE TONGUE  EVERY 5 TO 10 MINUTES PRIOR TO ACTIVITIES WHICH MIGHT PRECIPITATE AN ATTACK (Patient taking differently: Place 0.4 mg under the tongue every 5 (five) minutes as needed for chest pain.), Disp: 25 tablet, Rfl: 0   nystatin cream (MYCOSTATIN), Apply 1 application topically 2 (two) times daily as needed for dry skin., Disp: , Rfl:    Omega-3 Fatty Acids (FISH OIL) 1000 MG CAPS, Take 2 capsules (2,000 mg total) by mouth in the morning and at bedtime., Disp: 180 capsule, Rfl: 12   omeprazole (PRILOSEC) 20 MG capsule, Take 1 capsule by mouth once daily (Patient taking differently: Take 20 mg by mouth daily.), Disp: 90 capsule, Rfl: 0   oxyCODONE-acetaminophen (PERCOCET/ROXICET) 5-325 MG tablet, Take 1 tablet by mouth every 6 (six) hours as needed for severe pain., Disp: 120 tablet, Rfl: 0   revefenacin (YUPELRI) 175 MCG/3ML nebulizer solution, Take 3 mLs (175 mcg total) by nebulization daily., Disp: 90 mL, Rfl: 11   roflumilast (DALIRESP) 500 MCG TABS tablet, Take 0.5 tablets (250 mcg total) by mouth daily for 28 days, THEN 1 tablet (500 mcg total) daily., Disp: 30 tablet, Rfl: 11   rosuvastatin (CRESTOR) 40 MG tablet, Take 1 tablet by mouth once daily, Disp: 90 tablet, Rfl: 0   sulfamethoxazole-trimethoprim (BACTRIM DS) 800-160 MG tablet, Take 1 tablet by mouth 2 (two) times daily., Disp: 20 tablet, Rfl: 0   tamsulosin (FLOMAX) 0.4 MG CAPS capsule, TAKE 2 CAPSULES BY MOUTH ONCE DAILY AFTER SUPPER (Patient taking differently: Take 0.8 mg by mouth daily after supper.), Disp: 180 capsule, Rfl: 1   TOUJEO SOLOSTAR 300 UNIT/ML Solostar Pen, INJECT 30 UNITS SUBCUTANEOUSLY ONCE DAILY, Disp: 6 mL, Rfl: 0   traZODone (DESYREL) 150 MG tablet, Take 1 tablet (150 mg total) by mouth daily. (Patient taking differently: Take 75 mg by mouth at bedtime.), Disp: 270 tablet, Rfl: 0   VENTOLIN HFA 108 (90 Base) MCG/ACT inhaler, INHALE 2 PUFFS BY MOUTH EVERY 6 HOURS AS NEEDED FOR SHORTNESS OF BREATH FOR WHEEZING (Patient taking  differently: Inhale 2 puffs into the lungs See admin instructions. INHALE 2 PUFFS BY MOUTH EVERY 6 HOURS AS NEEDED FOR SHORTNESS OF BREATH FOR WHEEZING), Disp: 18 g, Rfl: 0   Vitamin D, Ergocalciferol, (DRISDOL) 1.25 MG (50000 UNIT) CAPS capsule, TAKE 1 CAPSULE BY MOUTH TWICE A WEEK, Disp: 10 capsule, Rfl: 0   predniSONE (DELTASONE) 10 MG tablet, Take 4 tabs for 2 days, then 3 tabs for 2 days, 2 tabs for 2  days, then 1 tab for 2 days, then stop. (Patient not taking: Reported on 02/15/2023), Disp: 20 tablet, Rfl: 0      Objective:   Vitals:   02/15/23 1056  BP: 108/60  Pulse: 85  SpO2: 96%  Weight: 198 lb (89.8 kg)  Height: 5\' 10"  (1.778 m)    Estimated body mass index is 28.41 kg/m as calculated from the following:   Height as of this encounter: 5\' 10"  (1.778 m).   Weight as of this encounter: 198 lb (89.8 kg).  @WEIGHTCHANGE @  [employer]   02/15/23 1056  Weight: 198 lb (89.8 kg)     Physical Exam  General: No distress.  Chronically deconditioned sitting on the wheelchair.  Periodically coughs. Neuro: Alert and Oriented x 3. GCS 15. Speech normal Psych: Pleasant Resp:  Barrel Chest - yes.  Wheeze - no, Crackles - no, No overt respiratory distress CVS: Normal heart sounds. Murmurs - no Ext: Stigmata of Connective Tissue Disease - no HEENT: Normal upper airway. PEERL +. No post nasal drip        Assessment:       ICD-10-CM   1. Stage 3 severe COPD by GOLD classification  J44.9 Respiratory or Resp and Sputum Culture    QuantiFERON-TB Gold Plus    DG Chest 2 View    B Nat Peptide    B Nat Peptide    QuantiFERON-TB Gold Plus    2. COPD, frequent exacerbations  J44.1     3. Chronic sinusitis, unspecified location  J32.9     4. Recurrent sinusitis  J32.9     5. Hoarseness of voice  R49.0     6. Tobacco use disorder  F17.200      He has very poor quality of life from recurrent sinusitis and COPD and chronic bronchitis.  There potential target areas such as  sinus and eosinophils to improve the quality of life.  However the action plan for these yet to start.  In the interim will improve nasal hygiene with Nettie pot and saline nasal spray and also ease up the sputum production using 3 mL 3% hypertonic saline nebulizer.  Will also get BNP evaluation to ensure there is no heart failure congestion but also sputum culture and chest x-ray.  He will continue his current medications.  Did indicate that if symptoms are out of hand he is to go to the ER or continue with the cycles of prednisone and antibiotics for the time being.    Plan:     Patient Instructions     ICD-10-CM   1. Stage 3 severe COPD by GOLD classification  J44.9     2. COPD, frequent exacerbations  J44.1     3. Chronic sinusitis, unspecified location  J32.9     4. Recurrent sinusitis  J32.9     5. Hoarseness of voice  R49.0     6. 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 along with ongoing smoking  -No evidence of lung cancer CT scan chest December 2023  Plan  - Await ENT consult mid April 2024 - Await starting of Dupixent as soon as possible  -The results from these investigations will determine how much her quality of life improves -Check chest x-ray two-view today 02/15/2023 -Check sputum culture for  Gram stain and culture AFB smear and culture 02/15/2023 -Check blood QuantiFERON gold and BNP 02/15/2023   -But in the meantime to improve quality of life  -Start saline nasal spray 2 squirts into each nostril daily  = Start Nettie pot at least 2-3 times daily  -Start 3 mL 3% hypertonic saline nebulizer for the lungs 1 times daily ([facility]]  -But also in the meanwhile continue your regular medications - Continue Budesonide 2 mL neb  Twice daily. Brush tongue and rinse mouth afterwards  - Continue Brovana Neb Twice daily  - Continue Yupelri Neb daily  - Continue Albuterol inhaler 2 puffs or 3 mL neb every 6 hours as needed for shortness of breath or wheezing. Notify if symptoms persist despite rescue inhaler/neb use. -Continue Flonase nasal spray   Work on not smoking  Continue on Oxygen 2l/m At bedtime   Use Vibratory vest   Follow up   - with Dr. Chase Caller (1st) or Tammy [name] in 6-8  weeks or sooner if needed    SIGNATURE    Dr. Brand Males, M.D., F.C.C.P,  Pulmonary and Critical Care Medicine Staff Physician, [place] Director - Interstitial Lung Disease  Program  Pulmonary [place] [facility] [place], [place], [address]  Pager: [phone], If no answer or between  15:00h - 7:00h: call [phone] Telephone: [phone]  12:06 PM 02/15/2023

## 2023-02-15 NOTE — Telephone Encounter (Signed)
Returned call to wife Anne Ng who states patient has not had any more symptoms since she called into the office and they have not withheld the drug. She states she will keep him on it. Also mentions she has the heart monitor and will continue with plan to wear it in May prior to visit in June. No further questions.

## 2023-02-15 NOTE — Patient Instructions (Addendum)
ICD-10-CM   1. Stage 3 severe COPD by GOLD classification  J44.9     2. COPD, frequent exacerbations  J44.1     3. Chronic sinusitis, unspecified location  J32.9     4. Recurrent sinusitis  J32.9     5. Hoarseness of voice  R49.0     6. 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 along with ongoing smoking  -No evidence of lung cancer CT scan chest December 2023  Plan  - Await ENT consult mid April 2024 - Await starting of Dupixent as soon as possible  -The results from these investigations will determine how much her quality of life improves -Check chest x-ray two-view today 02/15/2023 -Check sputum culture for Gram stain and culture AFB smear and culture 02/15/2023 -Check blood QuantiFERON gold and BNP 02/15/2023   -But in the meantime to improve quality of life  -Start saline nasal spray 2 squirts into each nostril daily  = Start Nettie pot at least 2-3 times daily  -Start 3 mL 3% hypertonic saline nebulizer for the lungs 1 times daily ([facility]]  -But also in the meanwhile continue your regular medications - Continue Budesonide 2 mL neb Twice daily. Brush tongue and rinse mouth afterwards  - Continue Brovana Neb Twice daily  - Continue Yupelri Neb daily  - Continue Albuterol inhaler 2 puffs or 3 mL neb every 6 hours as needed for shortness of breath or wheezing. Notify if symptoms persist despite rescue inhaler/neb use. -Continue Flonase nasal spray   Work on not smoking  Continue on Oxygen 2l/m At bedtime   Use Vibratory vest   Follow up   - with Dr. Chase Caller (1st) or Tammy [name] in 6-8  weeks or sooner if needed

## 2023-02-17 LAB — QUANTIFERON-TB GOLD PLUS
Mitogen-NIL: >10 IU/mL
NIL: 0.05 IU/mL
QuantiFERON-TB Gold Plus: NEGATIVE
TB1-NIL: 0.01 IU/mL
TB2-NIL: 0.01 IU/mL

## 2023-02-18 ENCOUNTER — Telehealth: Payer: Self-pay | Admitting: Internal Medicine

## 2023-02-18 ENCOUNTER — Other Ambulatory Visit: Payer: Self-pay | Admitting: Family Medicine

## 2023-02-18 DIAGNOSIS — R918 Other nonspecific abnormal finding of lung field: Secondary | ICD-10-CM

## 2023-02-18 LAB — RESPIRATORY CULTURE OR RESPIRATORY AND SPUTUM CULTURE
MICRO NUMBER:: 14782918
RESULT:: NORMAL
SPECIMEN QUALITY:: ADEQUATE

## 2023-02-18 NOTE — Telephone Encounter (Signed)
  Last CT chest was dec 2023. Given his repeated flare ups and some nodules that were new in the dec 2023 CT  -> aim for next CT chest without contrast in June 2024 (6 mo)   DG Chest 2 View  Result Date: 02/17/2023 CLINICAL DATA:  dyspnea EXAM: CHEST - 2 VIEW COMPARISON:  11/01/2022. FINDINGS: The heart size and mediastinal contours are within normal limits. Right lower lung nodules noted which are stable and could be better assessed with repeat CT. No pneumonia or pulmonary edema. No pneumothorax or pleural effusion. There are thoracic degenerative changes. IMPRESSION: Right lower lung nodules that could be followed with CT. Otherwise no acute cardiopulmonary disease. Electronically Signed   By: Layla Maw M.D.   On: 02/17/2023 23:38

## 2023-02-19 ENCOUNTER — Encounter: Payer: Self-pay | Admitting: Cardiology

## 2023-02-19 ENCOUNTER — Telehealth: Payer: Self-pay

## 2023-02-19 NOTE — Telephone Encounter (Signed)
Patient was seen on 02/15/23. Will close encounter.

## 2023-02-19 NOTE — Progress Notes (Unsigned)
Care Management & Coordination Services Pharmacy Team  Reason for Encounter: Diabetes  Contacted patient to discuss diabetes disease state. {US HC Outreach:28874}  Current antihyperglycemic regimen:  ***   Patient verbally confirms he is taking the above medications as directed. {yes/no:20286}  What diet changes have been made to improve diabetes control?  What recent interventions/DTPs have been made to improve glycemic control:  ***  Have there been any recent hospitalizations or ED visits since last visit with PharmD? {yes/no:20286}  Patient {reports/denies:24182} hypoglycemic symptoms, including {Hypoglycemic Symptoms:3049003}  Patient {reports/denies:24182} hyperglycemic symptoms, including {symptoms; hyperglycemia:17903}  How often are you checking your blood sugar? {BG Testing frequency:23922}  What are your blood sugars ranging?  Fasting: *** Before meals: *** After meals: *** Bedtime: ***  During the week, how often does your blood glucose drop below 70? {LowBGfrequency:24142}  Are you checking your feet daily/regularly? {yes/no:20286}  Adherence Review: Is the patient currently on a STATIN medication? {yes/no:20286} Is the patient currently on ACE/ARB medication? {yes/no:20286} Does the patient have >5 day gap between last estimated fill dates? {yes/no:20286}   Chart Updates: Recent office visits:  01-11-2023 Blane Ohara, MD. COMPLETED prednisone and levaquin. START bactrim 800-160 mg twice daily. PNA Vac given. WBC= 15.8, Hemo= 12.1, MCV= 78, MCH= 24.5, RDW= 15.5, Neutrophils abs= 9.7, lymph abs= 4.6, Mono abs= 1.1. Glucose= 141, Bun/creatinine= 8. A1C= 8.5. Trig= 267, HDL= 25, VLDL= 41  10-26-2022 Caudle, Angie A, CMA. Abnormal UA.   09-29-2022 Blane Ohara, MD. B12= 221. WBC= 12.9, MCH= 25.7, Neutrophils Absolute= 8.6. Glucose= 197. A1C= 7.5. Trig= 292, HDL= 24, VLDL= 44. Methylmalonic Acid= 452. TSH= 4.950. Vit D= 17.6. STOP breo ellipta.    09-22-2022  Jacklynn Bue, LPN. Covid booster vaccine given.   08-30-2022 Blane Ohara, MD. FINISHED keflex. START levofloxacin 500 mg daily and prednisone 20 mg follow instructions.   Recent consult visits:  02-15-2023 Kalman Shan, MD (Pulmonary). Respiratory or resp and sputum culture, Gram stain= Many Polymorphonuclear leukocytes Few epithelial cells Few Gram positive cocci in pairs   02-05-2023 Regan Lemming, MD (Cardiology). Follow up visit. EKG and long term monitoring completed.  01-04-2023 Kalman Shan, MD (Pulmonology). WBC= 11.8, Hemo= 12.1, HCT= 36.5, MCV= 75.5, RDW= 16.4, monocyte abs= 1.1.   12-22-2022 Dulce Sellar, Iline Oven, MD (Cardiology). START lasix 20 mg daily and magnesium 64 mg daily.   11-02-2022 Noemi Chapel, NP (Pulmonology). START Daliresp 500 mcg Take 0.5 tablets (250 mcg total) by mouth daily for 28 days, THEN 1 tablet (500 mcg total) daily.     10-26-2022 Kalman Shan, MD (Pulmonology). Pulmonary function test.  Hospital visits:  Medication Reconciliation was completed by comparing discharge summary, patient's EMR and Pharmacy list, and upon discussion with patient.   Admitted to the hospital on 11-09-2022 due to Ventricular ectopy. Discharge date was 11-10-2022. Discharged from [facility].     New?Medications Started at [facility] Discharge:?? Losartan 50 mg daily   Medication Changes at Hospital Discharge: Metoprolol 50 mg twice daily TO 100 mg daily   Medications Discontinued at Hospital Discharge: None   Medications that remain the same after Hospital Discharge:??  -All other medications will remain the same.     Medications: Outpatient Encounter Medications as of 02/19/2023  Medication Sig   ALPRAZolam (XANAX) 0.25 MG tablet TAKE 1 TABLET BY MOUTH ONCE DAILY AS NEEDED FOR ANXIETY   arformoterol (BROVANA) 15 MCG/2ML NEBU Take 2 mLs (15 mcg total) by nebulization 2 (two) times daily.   budesonide (PULMICORT) 0.25 MG/2ML  nebulizer solution Take 2 mLs (0.25 mg total) by nebulization in the morning and at bedtime.   cephALEXin (KEFLEX) 500 MG capsule Take 1 capsule (500 mg total) by mouth 3 (three) times daily.   clopidogrel (PLAVIX) 75 MG tablet Take 1 tablet by mouth once daily   cyanocobalamin (VITAMIN B12) 1000 MCG/ML injection INJECT 1 ML (CC) INTRAMUSCULARLY ONCE A WEEK   cyclobenzaprine (FLEXERIL) 10 MG tablet Take 1 tablet (10 mg total) by mouth See admin instructions. Take 1 tablet by mouth three times daily as needed for muscle spasm   DULoxetine (CYMBALTA) 60 MG capsule Take 1 capsule (60 mg total) by mouth 2 (two) times daily.   Dupilumab (DUPIXENT) 300 MG/2ML SOPN Inject 600mg  into the skin at Week 0 then 300mg  every 14 days thereafter.   fenofibrate 160 MG tablet Take 1 tablet by mouth once daily   finasteride (PROSCAR) 5 MG tablet Take 1 tablet by mouth once daily (Patient taking differently: Take 5 mg by mouth daily.)   furosemide (LASIX) 20 MG tablet Take 1 tablet (20 mg total) by mouth daily.   gabapentin (NEURONTIN) 400 MG capsule TAKE 1 CAPSULE BY MOUTH THREE TIMES DAILY   glucose blood (ONETOUCH ULTRA) test strip USE 1 STRIP TO CHECK GLUCOSE IN THE MORNING AND 1 AT BEDTIME AS DIRECTED   insulin lispro (HUMALOG) 100 UNIT/ML injection Inject 0.04 mLs (4 Units total) into the skin 3 (three) times daily with meals.   ipratropium-albuterol (DUONEB) 0.5-2.5 (3) MG/3ML SOLN USE 1 AMPULE IN NEBULIZER 4 TIMES DAILY (Patient taking differently: Take 3 mLs by nebulization See admin instructions. USE 1 AMPULE IN NEBULIZER 4 TIMES DAILY)   Lancets (ONETOUCH DELICA PLUS LANCET33G) MISC USE 1  TO CHECK GLUCOSE THREE TIMES DAILY (Patient taking differently: 1 each by Other route See admin instructions. USE 1 TO CHECK GLUCOSE THREE TIMES DAILY)   levofloxacin (LEVAQUIN) 750 MG tablet Take 1 tablet (750 mg total) by mouth daily.   levothyroxine (SYNTHROID) 25 MCG tablet Take 1 tablet by mouth once daily   losartan  (COZAAR) 50 MG tablet Take 1 tablet (50 mg total) by mouth daily.   magnesium chloride (SLOW-MAG) 64 MG TBEC SR tablet Take 1 tablet (64 mg total) by mouth daily.   metFORMIN (GLUCOPHAGE) 1000 MG tablet Take 1 tablet (1,000 mg total) by mouth 2 (two) times daily with a meal.   metoprolol succinate (TOPROL-XL) 100 MG 24 hr tablet Take 1 tablet (100 mg total) by mouth daily. Take with or immediately following a meal.   mexiletine (MEXITIL) 150 MG capsule Take 1 capsule (150 mg total) by mouth 2 (two) times daily.   MYRBETRIQ 25 MG TB24 tablet Take 1 tablet by mouth once daily (Patient taking differently: Take 25 mg by mouth daily.)   nitroGLYCERIN (NITROSTAT) 0.4 MG SL tablet DISSOLVE ONE TABLET UNDER THE TONGUE EVERY 5 TO 10 MINUTES PRIOR TO ACTIVITIES WHICH MIGHT PRECIPITATE AN ATTACK (Patient taking differently: Place 0.4 mg under the tongue every 5 (five) minutes as needed for chest pain.)   nystatin cream (MYCOSTATIN) Apply 1 application topically 2 (two) times daily as needed for dry skin.   Omega-3 Fatty Acids (FISH OIL) 1000 MG CAPS Take 2 capsules (2,000 mg total) by mouth in the morning and at bedtime.   omeprazole (PRILOSEC) 20 MG capsule Take 1 capsule by mouth once daily (Patient taking differently: Take 20 mg by mouth daily.)   oxyCODONE-acetaminophen (PERCOCET/ROXICET) 5-325 MG tablet Take 1 tablet by mouth every 6 (  six) hours as needed for severe pain.   predniSONE (DELTASONE) 10 MG tablet Take 4 tabs for 2 days, then 3 tabs for 2 days, 2 tabs for 2 days, then 1 tab for 2 days, then stop. (Patient not taking: Reported on 02/15/2023)   revefenacin (YUPELRI) 175 MCG/3ML nebulizer solution Take 3 mLs (175 mcg total) by nebulization daily.   roflumilast (DALIRESP) 500 MCG TABS tablet Take 0.5 tablets (250 mcg total) by mouth daily for 28 days, THEN 1 tablet (500 mcg total) daily.   rosuvastatin (CRESTOR) 40 MG tablet Take 1 tablet by mouth once daily   sulfamethoxazole-trimethoprim (BACTRIM  DS) 800-160 MG tablet Take 1 tablet by mouth 2 (two) times daily.   tamsulosin (FLOMAX) 0.4 MG CAPS capsule TAKE 2 CAPSULES BY MOUTH ONCE DAILY AFTER SUPPER (Patient taking differently: Take 0.8 mg by mouth daily after supper.)   TOUJEO SOLOSTAR 300 UNIT/ML Solostar Pen INJECT 30 UNITS SUBCUTANEOUSLY ONCE DAILY   traZODone (DESYREL) 150 MG tablet Take 1 tablet (150 mg total) by mouth daily. (Patient taking differently: Take 75 mg by mouth at bedtime.)   VENTOLIN HFA 108 (90 Base) MCG/ACT inhaler INHALE 2 PUFFS BY MOUTH EVERY 6 HOURS AS NEEDED FOR SHORTNESS OF BREATH FOR WHEEZING (Patient taking differently: Inhale 2 puffs into the lungs See admin instructions. INHALE 2 PUFFS BY MOUTH EVERY 6 HOURS AS NEEDED FOR SHORTNESS OF BREATH FOR WHEEZING)   Vitamin D, Ergocalciferol, (DRISDOL) 1.25 MG (50000 UNIT) CAPS capsule TAKE 1 CAPSULE BY MOUTH TWICE A WEEK   No facility-administered encounter medications on file as of 02/19/2023.    Recent Relevant Labs: Lab Results  Component Value Date/Time   HGBA1C 8.5 (H) 01/18/2023 08:04 AM   HGBA1C 7.5 (H) 09/29/2022 12:39 PM   MICROALBUR 30 05/23/2021 09:39 AM   MICROALBUR 80 08/23/2020 02:39 PM    Kidney Function Lab Results  Component Value Date/Time   CREATININE 1.13 01/18/2023 08:04 AM   CREATININE 0.93 11/10/2022 02:00 PM   GFR 74.13 08/17/2022 10:15 AM   GFRNONAA >60 11/10/2022 02:00 PM   GFRAA 100 12/16/2020 09:13 AM   02-19-2023: 1st attempt left VM 02-20-2023: 2nd attempt left VM  Star Rating Drugs:  Rosuvastatin 40 mg- Last filled 01-23-2023 90 DS. Previous 10-26-2022 90 DS Metformin 1000 mg- Last filled 12-26-2022 90 DS. Previous 10-01-2022 90 DS Losartan 50 mg- Last filled 01-05-2023 90 DS. Previous 11-10-2022 90 DS  Care Gaps: Annual wellness visit in last year? Yes Ophthalmology overdue Hep C screening overdue Shingrix overdue Covid booster overdue  Huey RomansMalecca Hicks Nexus Specialty Hospital-Shenandoah CampusCMA Clinical Pharmacist Assistant [phone]

## 2023-02-20 ENCOUNTER — Encounter: Payer: Self-pay | Admitting: Internal Medicine

## 2023-02-20 NOTE — Telephone Encounter (Signed)
Pt's spouse called the office and I let her know about the results of pt's cxr and recommendations per Dr. Marchelle Gearing and she verbalized understanding. CT has been ordered to be completed June 2024. Nothing further needed.

## 2023-02-21 NOTE — Telephone Encounter (Signed)
Dr. Marchelle Gearing please review and advise on CXR completed 02/15/23. Thank you

## 2023-02-21 NOTE — Telephone Encounter (Signed)
See my recent OV with t his wife and subsequtn CXR and phone call done to give those results. If I am missin anythig LMK

## 2023-02-22 NOTE — Telephone Encounter (Signed)
I called and spoke with the pt's spouse. She is already aware of cxr and lab results. She spoke with Irving Burton about the cxr results on 02/19/22. I confirmed that there is nothing further needed.

## 2023-02-26 ENCOUNTER — Telehealth: Payer: Self-pay

## 2023-02-26 NOTE — Telephone Encounter (Signed)
Returning a call to patient's wife after she left a voice mail on the triage phone -- unsuccessful attempt, I will try again later.

## 2023-02-26 NOTE — Telephone Encounter (Signed)
[name] states that Don [name] has worsened a lot over the past two weeks.  He is having trouble holding things, is spilling his drink, is very confused, has trouble getting his words out, and shakes badly while he is sleeping (so bad he shakes the bed).  She states that he eats and sleeps all of the time, said that he stated that he doesn't feel like himself, he feels like he is a little boy.  She feels like it started soon after he started taking Mexiletine on 3/25.  She said that the Phlegm has not gotten any better and pulmonology feels like it is in his throat not his lungs.  She took him to the ED over the weekend however he refused to stay because he "wants to die at home" not in the hospital.  She is requesting a video visit or an appointment tomorrow.

## 2023-02-26 NOTE — Telephone Encounter (Signed)
        Patient  visited [facility] on 02/21/2023  for treatment.   Telephone encounter attempt :  1st  A HIPAA compliant voice message was left requesting a return call.  Instructed patient to call back at [phone].   Marche Hottenstein [facility]  [facility] Population Health Community Resource Care Guide   [email or web address]  ?? [phone]   Website: [email or web address]  [email or web address]

## 2023-02-26 NOTE — Telephone Encounter (Signed)
I dicussed the following with patient's wife, stop mexelitine as Dr. Elberta Fortis had recommended and call cardiology back to let them know.  Monitor for symptoms. Dr. Sedalia Muta

## 2023-02-27 ENCOUNTER — Telehealth: Payer: Self-pay

## 2023-02-27 DIAGNOSIS — I429 Cardiomyopathy, unspecified: Secondary | ICD-10-CM | POA: Diagnosis not present

## 2023-02-27 DIAGNOSIS — K219 Gastro-esophageal reflux disease without esophagitis: Secondary | ICD-10-CM | POA: Diagnosis not present

## 2023-02-27 DIAGNOSIS — J449 Chronic obstructive pulmonary disease, unspecified: Secondary | ICD-10-CM | POA: Diagnosis not present

## 2023-02-27 DIAGNOSIS — C329 Malignant neoplasm of larynx, unspecified: Secondary | ICD-10-CM | POA: Diagnosis not present

## 2023-02-27 NOTE — Telephone Encounter (Signed)
        Patient  visited [facility] on 02/21/2023  for treatment.   Telephone encounter attempt :  2nd  A HIPAA compliant voice message was left requesting a return call.  Instructed patient to call back at [phone].   Dezmon Conover [place]  [facility] Population Health Community Resource Care Guide   [email or web address]  ?? [phone]   Website: [email or web address]  [email or web address]    

## 2023-03-01 ENCOUNTER — Telehealth: Payer: Self-pay

## 2023-03-01 NOTE — Telephone Encounter (Signed)
Patient wife called wanted to know can they do a video visit to change his depression medication, she stated that his anxiety has got worst , she feel it is due to his COPD getting worst, he is crying more in the day and his xanax was cut down due to being on pain medication, and don't know if that can be up again, stated he is only taking pain medication twice a day. Also wanted to make you aware that he stop the heart medication and his symptoms has got better. Stated that if he needs to come in she will only be able to bring him next Thursday. Pease advise

## 2023-03-01 NOTE — Telephone Encounter (Signed)
Patient appointment made for tomorrow at 11:20

## 2023-03-02 ENCOUNTER — Telehealth (INDEPENDENT_AMBULATORY_CARE_PROVIDER_SITE_OTHER): Payer: Medicare Other | Admitting: Family Medicine

## 2023-03-02 ENCOUNTER — Encounter: Payer: Self-pay | Admitting: Family Medicine

## 2023-03-02 DIAGNOSIS — F331 Major depressive disorder, recurrent, moderate: Secondary | ICD-10-CM

## 2023-03-02 HISTORY — DX: Major depressive disorder, recurrent, moderate: F33.1

## 2023-03-02 MED ORDER — DESVENLAFAXINE SUCCINATE ER 50 MG PO TB24
ORAL_TABLET | ORAL | 3 refills | Status: DC
Start: 2023-03-02 — End: 2023-07-05

## 2023-03-02 NOTE — Patient Instructions (Signed)
Week 1: Decrease duloxetine to 60 mg once at night.  Start pristiq 50 mg once daily in am.   Week 2: Stop duloxetine.  Increase pristiq 100 mg once daily in am.

## 2023-03-02 NOTE — Progress Notes (Signed)
Virtual Visit via Video Note   This visit type was conducted either per patient request OR due to national recommendations for restrictions regarding the COVID-19 Pandemic (e.g. social distancing) in an effort to limit this patient's exposure and mitigate transmission in our community.  Due to his co-morbid illnesses, this patient is at least at moderate risk for complications without adequate follow up.  This format is felt to be most appropriate for this patient at this time.  All issues noted in this document were discussed and addressed.  A limited physical exam was performed with this format.  A verbal consent was obtained for the virtual visit.   Date:  03/05/2023   ID:  Don [name], DOB 1950/12/15, MRN [id number]  Patient Location: Home Provider Location: Office/Clinic  PCP:  Blane Ohara, MD   Chief Complaint:  depression  History of Present Illness:    Don [name] is a 72 y.o. male with depression.  Chief Complaint  Patient presents with   Depression  . Depression is worse. He is crying several times a day. Not suicidal. Poor sleep and feels tired. Currently, on cymbalta but does not feel like it is helping.      03/02/2023    7:45 AM 01/11/2023    3:31 PM 09/29/2022   11:53 AM 01/05/2022    3:45 PM 05/20/2021   10:08 AM  Depression screen PHQ 2/9  Decreased Interest 1 0 0 0 0  Down, Depressed, Hopeless 2 0 0 0 0  PHQ - 2 Score 3 0 0 0 0  Altered sleeping 3      Tired, decreased energy 3      Change in appetite 0      Feeling bad or failure about yourself  0      Trouble concentrating 0      Moving slowly or fidgety/restless 0      Suicidal thoughts 0      PHQ-9 Score 9      Difficult doing work/chores Somewhat difficult             Past Medical History:  Diagnosis Date   Absence of bladder continence 01/08/2022   Acute bilateral low back pain without sciatica 01/08/2022   Anxiety state 02/02/2022   Arthritis    Asymptomatic LV dysfunction  10/18/2017   B12 deficiency anemia 08/25/2021   Basal cell carcinoma    Benign prostatic hyperplasia with incomplete bladder emptying 01/14/2019   Blood in ear canal, left 05/13/2021   Bone pain 03/08/2020   Cancer    throat - 1997, throat - 2018   Cardiomyopathy, secondary 12/01/2016   Overview:  EF 47% 12/26/16   Chest pain syndrome 11/02/2017   Chronic coronary artery disease 10/18/2017   Chronic cough 05/16/2022   Chronic ischemic heart disease 11/13/1998   Jan 22, 2003 Entered By: Aris Lot J Comment:  massive Mi in 2000 per patient hsitory, 2nd Mi in 2001Mar 11, 2004 Entered By: Aris Lot J Comment: Had stent placedin 2000,cath/angio in 2001   Chronic neck pain 05/16/2022   Chronic respiratory failure with hypoxia 05/16/2022   CKD (chronic kidney disease) stage 3, GFR 30-59 ml/min 07/25/2019   COPD (chronic obstructive pulmonary disease)    Coronary artery disease    Coronary artery disease involving native coronary artery of native heart with angina pectoris 12/01/2016   Overview:  He has hx of IWMI in remote past, last cath in 2012 at [facility] showed chronic total occlusion of previously stented RCA,  with good collaterals, a 40% LAD stenosis, inferior hypokinesis and EF 45%.    He's been lost to Cardiology f/u since 2015, but has not had any recurrent events   Deficiency anemia 08/25/2021   Depression    Diabetes mellitus 03/08/2020   Diabetic polyneuropathy associated with type 2 diabetes mellitus 05/16/2022   Diaphoresis 05/16/2022   Diverticulitis 05/02/2020   Dyslipidemia 12/01/2016   Emphysema lung    Erectile dysfunction due to diseases classified elsewhere 06/12/2019   Essential hypertension 10/18/2017   GERD (gastroesophageal reflux disease)    Hoarseness 11/15/2016   Hyperlipidemia    Hypertension    Insomnia 01/28/2021   Iron deficiency anemia due to chronic blood loss 08/25/2021   Laceration of thumb, left 12/17/2015   Left flank pain 03/14/2022   Left lower quadrant  abdominal tenderness without rebound tenderness 03/14/2022   Lesion of vocal cord    Leukoplakia of larynx 11/15/2016   Long-term use of aspirin therapy 10/02/2018   Malfunction of penile prosthesis 01/14/2019   Malignant neoplasm of skin 01/28/2021   Formatting of this note might be different from the original. Jan 22, 2003 Entered By: Aris Lot J Comment: of skin - removed x2 inpast Jan 22, 2003 Entered By: Aris Lot J Comment: of skin - removed x2 inpast   Melanoma of back    melanoma on back   Memory loss 03/08/2020   Mood disorder in conditions classified elsewhere 02/02/2022   Myalgia 03/08/2020   Myocardial infarction 2000   2 stents   Obstructive chronic bronchitis with exacerbation 05/16/2022   Oropharyngeal dysphagia 08/12/2018   Other ill-defined and unknown causes of morbidity and mortality 11/13/1993   Formatting of this note might be different from the original. Jan 31, 2008 Entered By: ARMOUR,ROSS B Comment: lumbar, 8/08 Jan 22, 2003 Entered By: Aris Lot J Comment: x67yrs in work place  quit 41yrs ago in 2000 Jan 22, 2003 Entered By: Aris Lot J Comment: x62yrs in work place  quit 14yrs ago in 2000 Jan 31, 2008 Entered By: Bennie Pierini B Comment: lumbar, 8/08   PAD (peripheral artery disease) 10/18/2017   Peripheral vascular disease    iliac artery clot   Peyronie's disease 06/12/2019   Pharyngoesophageal dysphagia 08/12/2018   Pneumonia 02/2015   Pneumothorax 06/07/2020   Preoperative clearance 04/03/2018   Squamous cell carcinoma of larynx 12/13/2016   Testicular pain, left 01/14/2019   Throat cancer    Tobacco use disorder 06/12/2019    Past Surgical History:  Procedure Laterality Date   ABDOMINAL ANGIOGRAM N/A 01/13/2015   Procedure: ABDOMINAL ANGIOGRAM;  Surgeon: Larina Earthly, MD;  Location: [facility] CATH LAB;  Service: Cardiovascular;  Laterality: N/A;   APPENDECTOMY     BACK SURGERY     CARDIAC CATHETERIZATION  2000/2012   with stents in 2000    CHOLECYSTECTOMY     COLONOSCOPY     ELBOW SURGERY     bilateral   ESOPHAGOGASTRODUODENOSCOPY     KNEE SURGERY Left    MICROLARYNGOSCOPY WITH CO2 LASER AND EXCISION OF VOCAL CORD LESION N/A 11/23/2016   Procedure: MICROLARYNGOSCOPY  AND EXCISION OF VOCAL CORD LESION;  Surgeon: Suzanna Obey, MD;  Location: [facility] OR;  Service: ENT;  Laterality: N/A;   MICROLARYNGOSCOPY WITH CO2 LASER AND EXCISION OF VOCAL CORD LESION N/A 01/11/2017   Procedure: MICROLARYNGOSCOPY WITH CO2 LASER AND EXCISION OF VOCAL CORD LESION;  Surgeon: Suzanna Obey, MD;  Location: [facility] OR;  Service: ENT;  Laterality: N/A;   MICROLARYNGOSCOPY WITH LASER N/A 03/16/2015  Procedure: MICROLARYNGOSCOPY ;  Surgeon: Suzanna Obey, MD;  Location: [facility] OR;  Service: ENT;  Laterality: N/A;   peyronie's surgery     SHOULDER SURGERY Right    rotator cuff   SPINE SURGERY  09/2020   cervical surgery. steel plate, bone spurs, allograft.   THROAT SURGERY  1997   cancer removed    Family History  Problem Relation Age of Onset   Cancer Mother        bone   Hypertension Mother    Stroke Father    Hypertension Father    Healthy Child    Cancer Other        brain   Diabetes Other     Social History   Socioeconomic History   Marital status: Married    Spouse name: Not on file   Number of children: 4   Years of education: Not on file   Highest education level: GED or equivalent  Occupational History   Occupation: retired  Tobacco Use   Smoking status: Every Day    Packs/day: 1.00    Years: 50.00    Additional pack years: 0.00    Total pack years: 50.00    Types: Cigarettes   Smokeless tobacco: Never   Tobacco comments:    down to .5 ppd  11/02/2022 hfb  Vaping Use   Vaping Use: Former  Substance and Sexual Activity   Alcohol use: No    Alcohol/week: 0.0 standard drinks of alcohol   Drug use: No   Sexual activity: Not on file  Other Topics Concern   Not on file  Social History Narrative   Lives with wife       One level       Right hand   Social Determinants of Health   Financial Resource Strain: Low Risk  (07/19/2022)   Overall Financial Resource Strain (CARDIA)    Difficulty of Paying Living Expenses: Not hard at all  Food Insecurity: No Food Insecurity (01/11/2023)   Hunger Vital Sign    Worried About Running Out of Food in the Last Year: Never true    Ran Out of Food in the Last Year: Never true  Transportation Needs: No Transportation Needs (07/19/2022)   PRAPARE - Administrator, Civil Service (Medical): No    Lack of Transportation (Non-Medical): No  Physical Activity: Sufficiently Active (01/11/2023)   Exercise Vital Sign    Days of Exercise per Week: 5 days    Minutes of Exercise per Session: 30 min  Stress: No Stress Concern Present (01/11/2023)   [employer] of Occupational Health - Occupational Stress Questionnaire    Feeling of Stress : Not at all  Social Connections: Moderately Isolated (01/11/2023)   Social Connection and Isolation Panel [NHANES]    Frequency of Communication with Friends and Family: Three times a week    Frequency of Social Gatherings with Friends and Family: Three times a week    Attends Religious Services: Never    Active Member of Clubs or Organizations: No    Attends Banker Meetings: Never    Marital Status: Married  Catering manager Violence: Not At Risk (01/11/2023)   Humiliation, Afraid, Rape, and Kick questionnaire    Fear of Current or Ex-Partner: No    Emotionally Abused: No    Physically Abused: No    Sexually Abused: No    Outpatient Medications Prior to Visit  Medication Sig Dispense Refill   ALPRAZolam (XANAX) 0.25 MG tablet TAKE  1 TABLET BY MOUTH ONCE DAILY AS NEEDED FOR ANXIETY 30 tablet 2   arformoterol (BROVANA) 15 MCG/2ML NEBU Take 2 mLs (15 mcg total) by nebulization 2 (two) times daily. 120 mL 6   budesonide (PULMICORT) 0.25 MG/2ML nebulizer solution Take 2 mLs (0.25 mg total) by nebulization in the morning and at  bedtime. 60 mL 11   clopidogrel (PLAVIX) 75 MG tablet Take 1 tablet by mouth once daily 90 tablet 0   cyanocobalamin (VITAMIN B12) 1000 MCG/ML injection INJECT 1 ML (CC) INTRAMUSCULARLY ONCE A WEEK 10 mL 0   cyclobenzaprine (FLEXERIL) 10 MG tablet Take 1 tablet (10 mg total) by mouth See admin instructions. Take 1 tablet by mouth three times daily as needed for muscle spasm 270 tablet 1   Dupilumab (DUPIXENT) 300 MG/2ML SOPN Inject 600mg  into the skin at Week 0 then 300mg  every 14 days thereafter. 12 mL 1   fenofibrate 160 MG tablet Take 1 tablet by mouth once daily 90 tablet 0   finasteride (PROSCAR) 5 MG tablet Take 1 tablet by mouth once daily (Patient taking differently: Take 5 mg by mouth daily.) 90 tablet 1   furosemide (LASIX) 20 MG tablet Take 1 tablet (20 mg total) by mouth daily. 90 tablet 3   gabapentin (NEURONTIN) 400 MG capsule TAKE 1 CAPSULE BY MOUTH THREE TIMES DAILY 270 capsule 0   glucose blood (ONETOUCH ULTRA) test strip USE 1 STRIP TO CHECK GLUCOSE IN THE MORNING AND 1 AT BEDTIME AS DIRECTED 100 each 3   insulin lispro (HUMALOG) 100 UNIT/ML injection Inject 0.04 mLs (4 Units total) into the skin 3 (three) times daily with meals. (Patient not taking: Reported on 03/02/2023) 10 mL 11   ipratropium-albuterol (DUONEB) 0.5-2.5 (3) MG/3ML SOLN USE 1 AMPULE IN NEBULIZER 4 TIMES DAILY (Patient taking differently: Take 3 mLs by nebulization See admin instructions. USE 1 AMPULE IN NEBULIZER 4 TIMES DAILY) 360 mL 1   Lancets (ONETOUCH DELICA PLUS LANCET33G) MISC USE 1  TO CHECK GLUCOSE THREE TIMES DAILY (Patient taking differently: 1 each by Other route See admin instructions. USE 1 TO CHECK GLUCOSE THREE TIMES DAILY) 100 each 0   levothyroxine (SYNTHROID) 25 MCG tablet Take 1 tablet by mouth once daily 90 tablet 0   losartan (COZAAR) 50 MG tablet Take 1 tablet (50 mg total) by mouth daily. 90 tablet 3   magnesium chloride (SLOW-MAG) 64 MG TBEC SR tablet Take 1 tablet (64 mg total) by mouth  daily. 90 tablet 3   metFORMIN (GLUCOPHAGE) 1000 MG tablet Take 1 tablet (1,000 mg total) by mouth 2 (two) times daily with a meal. 180 tablet 2   mexiletine (MEXITIL) 150 MG capsule Take 1 capsule (150 mg total) by mouth 2 (two) times daily. (Patient not taking: Reported on 03/02/2023) 60 capsule 3   MYRBETRIQ 25 MG TB24 tablet Take 1 tablet by mouth once daily (Patient taking differently: Take 25 mg by mouth daily.) 30 tablet 0   nitroGLYCERIN (NITROSTAT) 0.4 MG SL tablet DISSOLVE ONE TABLET UNDER THE TONGUE EVERY 5 TO 10 MINUTES PRIOR TO ACTIVITIES WHICH MIGHT PRECIPITATE AN ATTACK (Patient taking differently: Place 0.4 mg under the tongue every 5 (five) minutes as needed for chest pain.) 25 tablet 0   nystatin cream (MYCOSTATIN) Apply 1 application topically 2 (two) times daily as needed for dry skin.     Omega-3 Fatty Acids (FISH OIL) 1000 MG CAPS Take 2 capsules (2,000 mg total) by mouth in the morning and at bedtime. 180  capsule 12   omeprazole (PRILOSEC) 20 MG capsule Take 1 capsule by mouth once daily (Patient taking differently: Take 20 mg by mouth daily.) 90 capsule 0   oxyCODONE-acetaminophen (PERCOCET/ROXICET) 5-325 MG tablet Take 1 tablet by mouth every 6 (six) hours as needed for severe pain. 120 tablet 0   revefenacin (YUPELRI) 175 MCG/3ML nebulizer solution Take 3 mLs (175 mcg total) by nebulization daily. 90 mL 11   rosuvastatin (CRESTOR) 40 MG tablet Take 1 tablet by mouth once daily 90 tablet 0   tamsulosin (FLOMAX) 0.4 MG CAPS capsule TAKE 2 CAPSULES BY MOUTH ONCE DAILY AFTER SUPPER (Patient taking differently: Take 0.8 mg by mouth daily after supper.) 180 capsule 1   TOUJEO SOLOSTAR 300 UNIT/ML Solostar Pen INJECT 30 UNITS SUBCUTANEOUSLY ONCE DAILY 6 mL 0   traZODone (DESYREL) 150 MG tablet Take 1 tablet (150 mg total) by mouth daily. (Patient taking differently: Take 75 mg by mouth at bedtime.) 270 tablet 0   VENTOLIN HFA 108 (90 Base) MCG/ACT inhaler INHALE 2 PUFFS BY MOUTH EVERY  6 HOURS AS NEEDED FOR SHORTNESS OF BREATH FOR WHEEZING (Patient taking differently: Inhale 2 puffs into the lungs See admin instructions. INHALE 2 PUFFS BY MOUTH EVERY 6 HOURS AS NEEDED FOR SHORTNESS OF BREATH FOR WHEEZING) 18 g 0   Vitamin D, Ergocalciferol, (DRISDOL) 1.25 MG (50000 UNIT) CAPS capsule TAKE 1 CAPSULE BY MOUTH TWICE A WEEK 10 capsule 0   cephALEXin (KEFLEX) 500 MG capsule Take 1 capsule (500 mg total) by mouth 3 (three) times daily. 15 capsule 0   DULoxetine (CYMBALTA) 60 MG capsule Take 1 capsule (60 mg total) by mouth 2 (two) times daily. 180 capsule 1   levofloxacin (LEVAQUIN) 750 MG tablet Take 1 tablet (750 mg total) by mouth daily. 7 tablet 0   metoprolol succinate (TOPROL-XL) 100 MG 24 hr tablet Take 1 tablet (100 mg total) by mouth daily. Take with or immediately following a meal. 90 tablet 3   predniSONE (DELTASONE) 10 MG tablet Take 4 tabs for 2 days, then 3 tabs for 2 days, 2 tabs for 2 days, then 1 tab for 2 days, then stop. (Patient not taking: Reported on 02/15/2023) 20 tablet 0   roflumilast (DALIRESP) 500 MCG TABS tablet Take 0.5 tablets (250 mcg total) by mouth daily for 28 days, THEN 1 tablet (500 mcg total) daily. 30 tablet 11   sulfamethoxazole-trimethoprim (BACTRIM DS) 800-160 MG tablet Take 1 tablet by mouth 2 (two) times daily. 20 tablet 0   No facility-administered medications prior to visit.    Allergies  Allergen Reactions   Penicillins Rash and Hives    Has patient had a PCN reaction causing immediate rash, facial/tongue/throat swelling, SOB or lightheadedness with hypotension:unsure Has patient had a PCN reaction causing severe rash involving mucus membranes or skin necrosis:unsure Has patient had a PCN reaction that required hospitalization:No Has patient had a PCN reaction occurring within the last 10 years:No If all of the above answers are "NO", then may proceed with Cephalosporin use.  rash   Bactrim [Sulfamethoxazole-Trimethoprim]     Dizziness,  confusion, involuntary movements   Doxycycline Rash   Iodinated Contrast Media Rash    Also developed blisters   Penicillin G Rash   Tramadol Rash     Social History   Tobacco Use   Smoking status: Every Day    Packs/day: 1.00    Years: 50.00    Additional pack years: 0.00    Total pack years: 50.00  Types: Cigarettes   Smokeless tobacco: Never   Tobacco comments:    down to .5 ppd  11/02/2022 hfb  Vaping Use   Vaping Use: Former  Substance Use Topics   Alcohol use: No    Alcohol/week: 0.0 standard drinks of alcohol   Drug use: No     Review of Systems  Constitutional:  Positive for malaise/fatigue.  Psychiatric/Behavioral:  Positive for depression. Negative for hallucinations and suicidal ideas. The patient is nervous/anxious and has insomnia.      Labs/Other Tests and Data Reviewed:    Recent Labs: 11/09/2022: B Natriuretic Peptide 216.5; TSH 3.430 11/10/2022: Magnesium 1.9 01/18/2023: ALT 12; BUN 9; Creatinine, Ser 1.13; Hemoglobin 12.1; Platelets 279; Potassium 4.8; Sodium 139 02/15/2023: Pro B Natriuretic peptide (BNP) 70.0   Recent Lipid Panel Lab Results  Component Value Date/Time   CHOL 106 01/18/2023 08:04 AM   TRIG 267 (H) 01/18/2023 08:04 AM   HDL 25 (L) 01/18/2023 08:04 AM   CHOLHDL 4.2 01/18/2023 08:04 AM   LDLCALC 40 01/18/2023 08:04 AM    Wt Readings from Last 3 Encounters:  02/15/23 198 lb (89.8 kg)  02/05/23 200 lb (90.7 kg)  01/11/23 196 lb (88.9 kg)     Objective:    Vital Signs:  There were no vitals taken for this visit.   Physical Exam Psychiatric:        Behavior: Behavior normal.     Comments: Depressed.       ASSESSMENT & PLAN:   Moderate recurrent major depression Assessment & Plan: Week 1: Decrease duloxetine to 60 mg once at night.  Start pristiq 50 mg once daily in am.   Week 2: Stop duloxetine.  Increase pristiq 100 mg once daily in am.   Orders: -     Desvenlafaxine Succinate ER; Take 1 tablet (50 mg total)  by mouth in the morning for 7 days, THEN 2 tablets (100 mg total) in the morning for 23 days.  Dispense: 49 tablet; Refill: 3     No orders of the defined types were placed in this encounter.    Meds ordered this encounter  Medications   desvenlafaxine (PRISTIQ) 50 MG 24 hr tablet    Sig: Take 1 tablet (50 mg total) by mouth in the morning for 7 days, THEN 2 tablets (100 mg total) in the morning for 23 days.    Dispense:  49 tablet    Refill:  3    I spent 10  minutes dedicated to the care of this patient on the date of this encounter to include face-to-face time with the patient.  Follow Up:  In Person in 4 week(s)  Signed, Blane Ohara, MD  03/05/2023 12:24 AM    [facility] [place]

## 2023-03-03 ENCOUNTER — Encounter: Payer: Self-pay | Admitting: Internal Medicine

## 2023-03-03 ENCOUNTER — Other Ambulatory Visit: Payer: Self-pay | Admitting: Family Medicine

## 2023-03-03 ENCOUNTER — Telehealth: Payer: Self-pay | Admitting: Family Medicine

## 2023-03-03 MED ORDER — LEVOFLOXACIN 750 MG PO TABS: 750.0000 mg | ORAL_TABLET | Freq: Every day | ORAL | 0 refills | Status: DC

## 2023-03-03 MED ORDER — PREDNISONE 20 MG PO TABS: ORAL_TABLET | ORAL | 0 refills | Status: AC

## 2023-03-03 NOTE — Assessment & Plan Note (Signed)
Week 1: Decrease duloxetine to 60 mg once at night.  Start pristiq 50 mg once daily in am.   Week 2: Stop duloxetine.  Increase pristiq 100 mg once daily in am.  

## 2023-03-03 NOTE — Telephone Encounter (Signed)
Don [name] called and requested treatment for copd exacerbation.  She just found out their grandchildren have been passing parainfluenza around and she is concerned Don [name] has come down with it and it has exacerbated his copd. Sent levaquin 750 mg daily x 7 days and prednisone long taper.  Dr. Sedalia Muta

## 2023-03-05 ENCOUNTER — Telehealth: Payer: Self-pay

## 2023-03-05 ENCOUNTER — Encounter: Payer: Self-pay | Admitting: Internal Medicine

## 2023-03-05 NOTE — Telephone Encounter (Signed)
Received a fax from  [employer] regarding an approval for DUPIXENT patient assistance from 03/02/2023 to 11/13/2023. Approval letter sent to scan center.  Phone #: [phone] Fax #: [phone]  Attempted to call pt's wife to provide update, left VoiceMail requesting a return call. Direct office number provided.

## 2023-03-05 NOTE — Telephone Encounter (Signed)
Patient has been approved for Desvenlafaxine through [facility]. Approval is good from 03/02/23 to 03/01/24.

## 2023-03-07 ENCOUNTER — Other Ambulatory Visit: Payer: Self-pay | Admitting: Family Medicine

## 2023-03-08 ENCOUNTER — Ambulatory Visit: Payer: Medicare Other | Admitting: Adult Health

## 2023-03-08 NOTE — Telephone Encounter (Signed)
MyChart message sent to patient today to advise to schedule Dupixent shipment and we can schedule Dupixent new start visit in clinic pending when shipment is coming out to their home.  Chesley Mires, PharmD, MPH, BCPS, CPP Clinical Pharmacist (Rheumatology and Pulmonology)

## 2023-03-09 ENCOUNTER — Other Ambulatory Visit: Payer: Self-pay

## 2023-03-09 MED ORDER — OXYCODONE-ACETAMINOPHEN 5-325 MG PO TABS: 1.0000 | ORAL_TABLET | Freq: Four times a day (QID) | ORAL | 0 refills | Status: DC | PRN

## 2023-03-16 NOTE — Telephone Encounter (Signed)
I spoke with patient's wife. She sates she administered Dupixent 600mg  (as two divided injections) on Wednesday, 03/14/2023. Wife used to be a Engineer, civil (consulting). Patient tolerated well and no injection site reaction. She is aware that moving forward, his dosing will be one pen (300mg ) every 2 weeks  All questions encouraged and answered. Will close encounter.  Chesley Mires, PharmD, MPH, BCPS, CPP Clinical Pharmacist (Rheumatology and Pulmonology)

## 2023-03-17 DIAGNOSIS — J9610 Chronic respiratory failure, unspecified whether with hypoxia or hypercapnia: Secondary | ICD-10-CM | POA: Diagnosis not present

## 2023-03-17 DIAGNOSIS — J449 Chronic obstructive pulmonary disease, unspecified: Secondary | ICD-10-CM | POA: Diagnosis not present

## 2023-03-21 ENCOUNTER — Other Ambulatory Visit: Payer: Self-pay | Admitting: Pharmacist

## 2023-03-21 MED ORDER — DUPIXENT 300 MG/2ML ~~LOC~~ SOAJ: 300.0000 mg | SUBCUTANEOUS | 1 refills | Status: DC

## 2023-03-22 ENCOUNTER — Other Ambulatory Visit: Payer: Self-pay | Admitting: Family Medicine

## 2023-03-22 ENCOUNTER — Ambulatory Visit: Payer: Medicare Other | Admitting: Family Medicine

## 2023-03-23 ENCOUNTER — Telehealth: Payer: Self-pay

## 2023-03-23 NOTE — Telephone Encounter (Signed)
Patient's wife just called to let us know that her and her husband were both exposed to FLU B and 3 children that she keeps have it. She states he does not have any symptoms or anything yet but just wanted [place] to be informed in case it dose turn into something.

## 2023-03-23 NOTE — Progress Notes (Unsigned)
Care Management & Coordination Services Pharmacy Team  Reason for Encounter: Appointment Reminder  Contacted patient to confirm {visittype:27222} appointment with ***, PharmD on *** at ***. {US [facility] Outreach:28874}  Do you have any problems getting your medications? {yes/no:20286} If yes what types of problems are you experiencing? {Problems:27223}  What is your top health concern you would like to discuss at your upcoming visit?   Have you seen any other providers since your last visit with PCP? {yes/no:20286}   Chart review:  Recent office visits:  4/24/2024Blane Ohara, MD. (PCP)- Refil Encounter- Tamsulosin 0.8 mg changed from 2 capsules daily to 1 capsule daily.  4/20/2024Blane Ohara, MD. (PCP)- Orders Only Encounter- Levaquin 750 mg 1 tablet daily for 7 days and Prednisone 20 mg Take 3 tablets (60 mg total) by mouth daily with breakfast for 3 days, THEN 2 tablets (40 mg total) daily with breakfast for 3 days, THEN 1 tablet (20 mg total) daily with breakfast for 3 days STARTED.   03/02/2023- Blane Ohara, MD. (PCP)- Video Visit for Moderate recurrent major depression  Pristiq 50 mg Take 1 tablet (50 mg total) by mouth in the morning for 7 days, THEN 2 tablets (100 mg total) in the morning for 23 days STARTED Cephalexin 500 mg, Duloxetine 60 mg, Levofloxacin 750 mg, Metoprolol Succinate 100 mg, Prednisone 10 mg, Daliresp 500 mcg and Bactrim DS 800/160 mg discontinued. Follow up  In Person in 4 week(s)   01-11-2023 - Cox, Fritzi Mandes, MD. (PCP)- Annual Wellness visit and Follow up visit for Atherosclerosis of aorta [facility]), Mixed hyperlipidemia, Chronic respiratory failure with hypoxia (HCC), Hypertensive heart disease with heart failure (HCC), Diabetic polyneuropathy associated with type 2 diabetes mellitus (HCC), Need for vaccination, Primary insomnia, Vitamin D deficiency, Acute non-recurrent sinusitis of other sinus prednisone and levaquin discontinued START bactrim 800-160 mg twice  daily. Decrease trazodone to 75 mg qhs  Recommend increase lantus insulin using a sliding scale, check sugars twice a day before breakfast and supper  Talk with pharmacist about shingrix vaccines    PNA Vac given Return in 1 year (on 01/11/2024).    09-29-2022 CoxFritzi Mandes, MD.(PCP)- Follow up visit for Memory loss, Diabetic polyneuropathy associated with type 2 diabetes mellitus (HCC), Mixed hyperlipidemia, Chronic respiratory failure with hypoxia (HCC), Osteopenia, unspecified location, Hallucination, Coronary artery disease involving native coronary artery of native heart with angina pectoris (HCC), Hypertensive heart disease with heart failure (HCC), Peripheral vascular disease (HCC)  Breo Ellipta discontinued Gabapentin 400 mg changed from tid to bid Decrease trazodone 150 mg 1/2 daily. Hold flexeril. Decrease percocet to twice daily  Return if symptoms worsen or fail to improve.    Recent consult visits:  03/21/2023- Harold Barban, RPH-CPP ( Pulmonology)- Refill Encounter- Dupixent changed from 600 mg sq a week to 300 mg sq every 14 days.  02/27/2023- Brynda Peon, MD (Otolaryngology)- Initial visit for Laryngopharyngeal reflux (LPR) (Primary Dx); Squamous cell carcinoma of larynx (HCC); Chronic obstructive pulmonary disease, unspecified COPD type (CMS/HCC); Cardiomyopathy, secondary (HCC);Tobacco abuse  Laryngoscopy Flexible Diagnostic performed Lisinopril HCTZ 10/12.5 mg, Cyanocobalamin 1000 mcg/ml injection discontinued. Follow-up annually or sooner as needed.   02-15-2023 Kalman Shan, MD (Pulmonary). Follow up visit for Stage 3 severe COPD by GOLD classification (HCC), COPD, frequent exacerbations (HCC),Chronic sinusitis, unspecified location, Recurrent sinusitis, Hoarseness of voice, Tobacco use disorder No medication changes Respiratory or resp and sputum culture Follow up with Dr. Marchelle Gearing (1st) or Tammy [name] in 6-8 weeks or sooner if needed   02-05-2023 [facility], [place]  Daphine Deutscher, MD (Cardiology). Follow up visit for PVC's (premature ventricular contractions), Coronary artery disease of native artery of native heart with stable angina pectoris (HCC), Essential hypertension, Chronic systolic (congestive) heart failure (HCC) Mexitil 150 mg 1 bid started Follow up in 3 month(s) in [place]    01-04-2023 Kalman Shan, MD (Pulmonology). Follow up visit for Stage 3 severe COPD by GOLD classification (HCC), COPD, frequent exacerbations (HCC)  recommend adding breo to the regimen scheduled daily recommend using duoneb as needed   recommend holding off pulmicort recommend you talk to cadiologist and change lisinopril to another class; and see if cough improved later at followup can discuss role of roflumilast in preventing flareups  Follow up in 6-8 weeks to see response   12-22-2022 Baldo Daub, MD (Cardiology). Follow up visit for Frequent PVCs, RBBB (right bundle branch block with left anterior fascicular block), LV dysfunction, Coronary artery disease involving native coronary artery of native heart with angina pectoris (HCC), Essential hypertension, Mixed hyperlipidemia, Hypomagnesemia, Orthopnea START lasix 20 mg daily and magnesium 64 mg daily. Follow up in 3 months   11-02-2022 Noemi Chapel, NP (Pulmonology). Follow up visit for Chronic obstructive pulmonary disease, unspecified COPD type (HCC), COPD with chronic bronchitis and emphysema (HCC), Chronic respiratory failure with hypoxia (HCC), Lung nodules START Daliresp 500 mcg Take 0.5 tablets (250 mcg total) by mouth daily for 28 days, THEN 1 tablet (500 mcg total) daily.   Follow up with Dr. Marchelle Gearing (1st) or Tammy [name] in 4-5 weeks to see how daliresp is going, Please contact office for sooner follow up if symptoms do not improve or worsen or seek emergency care    10-26-2022 Kalman Shan, MD (Pulmonology). Pulmonary function test . 10/26/2022  Letta Kocher, MD (Physical Medicine and  Rehabilitation)- Follow up visit for Spondylosis without myelopathy or radiculopathy, Lumbar region. Lumbar MRI performed- no acute findings, no fractures or spondylolisthesis Follow up in 2-4 weeks for reassessment, consider repeat injection if pain persists, consider new MRI.   Hospital visits:  Medication Reconciliation was completed by comparing discharge summary, patient's EMR and Pharmacy list, and upon discussion with patient.  Admitted to the hospital on 11/09/2022 due to Ventricular ectopy, Shortness of breath, Chest pain, unspecified type. Discharge date was 11/10/2022. Discharged from [facility].    New?Medications Started at [facility] Discharge:?? -started Losartan Potassium 50 mg 1 tablet daily due to Ventricular ectopy, Shortness of breath, Chest pain, unspecified type  Medication Changes at Hospital Discharge: -Changed Metoprolol Tartrate 100mg  changed from 1 tablet bid to 1 tablet qd  Medications Discontinued at Hospital Discharge: -Stopped None  Medications that remain the same after Hospital Discharge:??  -All other medications will remain the same.     Star Rating Drugs:  Medication:  Last Fill: Day Supply Losartan 50 mg   LF 01/05/2023   90 DS,  12/06/2022  30 DS Metformin 1000 mg LF  12/26/2022, 10/01/2022    90 DS Rosuvastatin 40 mg LF  01/23/2023, 10/26/2022    90 DS  Care Gaps: Annual wellness visit in last year? Yes- 01/11/2023 Last Colonoscopy- 10/19/2021 Follow up not recommended Hepatitis C Screening (Once)- Never done Zoster Vaccines- Shingrix (1 of 2)- Never done COVID-19 Vaccine (3 - Mixed Product risk series)- Last completed: Sep 22, 2022   If Diabetic: Last eye exam / retinopathy screening: Overdue Last diabetic foot exam: Overdue   Billee Cashing, [facility] Clinical Pharmacist Assistant [phone]

## 2023-03-27 ENCOUNTER — Ambulatory Visit: Payer: Medicare Other

## 2023-03-27 NOTE — Patient Outreach (Signed)
  Care Management   Follow Up Note   03/27/2023 Name: Don [name] MRN: [id number] DOB: March 12, 1951   Referred by: Blane Ohara, MD Reason for referral : Care Coordination   An unsuccessful telephone outreach was attempted today. The patient was referred to the case management team for assistance with care management and care coordination.   Follow Up Plan:  Remove patient from program, he hasn't returned calls in a long time  [name], [place].D. - [phone]

## 2023-03-29 ENCOUNTER — Other Ambulatory Visit: Payer: Self-pay | Admitting: Family Medicine

## 2023-04-01 DIAGNOSIS — M545 Low back pain, unspecified: Secondary | ICD-10-CM | POA: Diagnosis not present

## 2023-04-01 DIAGNOSIS — M48061 Spinal stenosis, lumbar region without neurogenic claudication: Secondary | ICD-10-CM | POA: Diagnosis not present

## 2023-04-01 DIAGNOSIS — M5136 Other intervertebral disc degeneration, lumbar region: Secondary | ICD-10-CM | POA: Diagnosis not present

## 2023-04-02 ENCOUNTER — Telehealth: Payer: Self-pay | Admitting: Cardiology

## 2023-04-02 NOTE — Telephone Encounter (Signed)
Pt c/o Shortness Of Breath: STAT if SOB developed within the last 24 hours or pt is noticeably SOB on the phone  1. Are you currently SOB (can you hear that pt is SOB on the phone)? Wife states he is on oxygen.    2. How long have you been experiencing SOB? For the past week  3. Are you SOB when sitting or when up moving around? Wife states he is SOB all the time.    4. Are you currently experiencing any other symptoms? He gets dizzy when he stands.

## 2023-04-02 NOTE — Telephone Encounter (Signed)
Called patient and his wife reported that he was becoming more SOB than he has been in the past. Latest blood pressure was 134/70 HR 75 O2 sat 94 - 97%. Per the patient he is on O2 at 2 liters PRN. He is currently becoming more SOB and having to use his oxygen more frequently ecspecially when he gets up to go to the bathroom or lays down in bed. Spoke with Dr. Dulce Sellar regarding this patient's symptoms and he recommended that they contact their PCP for further guidance. I relayed this information to Anette the patient's wife and she was agreeable with this plan and had no further questions at this time.

## 2023-04-03 ENCOUNTER — Ambulatory Visit (INDEPENDENT_AMBULATORY_CARE_PROVIDER_SITE_OTHER): Payer: Medicare Other | Admitting: Family Medicine

## 2023-04-03 ENCOUNTER — Telehealth: Payer: Self-pay

## 2023-04-03 VITALS — BP 126/68 | HR 78 | Temp 97.0°F | Resp 18 | Ht 70.0 in | Wt 207.0 lb

## 2023-04-03 DIAGNOSIS — R3915 Urgency of urination: Secondary | ICD-10-CM

## 2023-04-03 DIAGNOSIS — R42 Dizziness and giddiness: Secondary | ICD-10-CM

## 2023-04-03 DIAGNOSIS — M545 Low back pain, unspecified: Secondary | ICD-10-CM | POA: Diagnosis not present

## 2023-04-03 DIAGNOSIS — J411 Mucopurulent chronic bronchitis: Secondary | ICD-10-CM | POA: Diagnosis not present

## 2023-04-03 LAB — POCT URINALYSIS DIP (CLINITEK)
Bilirubin, UA: NEGATIVE
Blood, UA: NEGATIVE
Glucose, UA: NEGATIVE mg/dL
Ketones, POC UA: NEGATIVE mg/dL
Leukocytes, UA: NEGATIVE
Nitrite, UA: NEGATIVE
POC PROTEIN,UA: NEGATIVE
Spec Grav, UA: 1.025 (ref 1.010–1.025)
Urobilinogen, UA: 0.2 E.U./dL
pH, UA: 5.5 (ref 5.0–8.0)

## 2023-04-03 NOTE — Progress Notes (Signed)
Subjective:  Patient ID: Don [name], male    DOB: 1951/09/16  Age: 72 y.o. MRN: [id number]  Chief Complaint  Patient presents with   Follow-up    HPI Don [name] for [facility] ED follow-up.  He was seen on Apr 01, 2023 and evaluated for right low back pain.  He had a previous fall with worsening low back pain.  He has a previous diagnosis of  lumbar stenosis.  CT lumbar spine w/o contrast showed postoperative and degenerative changes [name] the lumbar spine with no acute bony abnormality.  He is complaining of shortness of breath and vertigo which seems to be occurring when getting up to the bathroom.  His oxygen saturation has been holding [name] the low to mid 90's.  He denies fever or chills but has been diaphoretic.     04/03/2023    4:09 PM 03/02/2023    7:45 AM 01/11/2023    3:31 PM 09/29/2022   11:53 AM 01/05/2022    3:45 PM  Depression screen PHQ 2/9  Decreased Interest 0 1 0 0 0  Down, Depressed, Hopeless 0 2 0 0 0  PHQ - 2 Score 0 3 0 0 0  Altered sleeping  3     Tired, decreased energy  3     Change [name] appetite  0     Feeling bad or failure about yourself   0     Trouble concentrating  0     Moving slowly or fidgety/restless  0     Suicidal thoughts  0     PHQ-9 Score  9     Difficult doing work/chores  Somewhat difficult           04/03/2023    4:08 PM  Fall Risk   Falls [name] the past year? 1  Number falls [name] past yr: 1  Injury with Fall? 0  Risk for fall due to : Impaired mobility  Follow up Falls evaluation completed;Falls prevention discussed    Patient Care Team: Blane Ohara, MD as PCP - General (Family Medicine) Revankar, Aundra Dubin, MD as PCP - Cardiology (Cardiology) Regan Lemming, MD as PCP - Electrophysiology (Cardiology) Baldo Daub, MD as Referring Physician (Cardiology) Suzanna Obey, MD as Consulting Physician (Otolaryngology) Zettie Pho, [facility] as Pharmacist (Pharmacist)   Review of Systems  Constitutional:  Positive  for diaphoresis and fatigue. Negative for chills and fever.  HENT:  Negative for congestion, rhinorrhea and sore throat.   Respiratory:  Positive for shortness of breath. Negative for cough.   Cardiovascular:  Positive for leg swelling. Negative for chest pain and palpitations.  Gastrointestinal:  Positive for nausea. Negative for abdominal pain, constipation, diarrhea and vomiting.  Genitourinary:  Positive for difficulty urinating. Negative for dysuria and urgency.  Musculoskeletal:  Positive for myalgias. Negative for arthralgias and back pain.  Neurological:  Positive for dizziness. Negative for headaches.  Psychiatric/Behavioral:  Negative for dysphoric mood. The patient is not nervous/anxious.     Current Outpatient Medications on File Prior to Visit  Medication Sig Dispense Refill   ALPRAZolam (XANAX) 0.25 MG tablet TAKE 1 TABLET BY MOUTH ONCE DAILY AS NEEDED FOR ANXIETY 30 tablet 2   arformoterol (BROVANA) 15 MCG/2ML NEBU Take 2 mLs (15 mcg total) by nebulization 2 (two) times daily. 120 mL 6   budesonide (PULMICORT) 0.25 MG/2ML nebulizer solution Take 2 mLs (0.25 mg total) by nebulization [name] the morning and at bedtime. 60 mL 11  clopidogrel (PLAVIX) 75 MG tablet Take 1 tablet by mouth once daily 90 tablet 0   cyanocobalamin (VITAMIN B12) 1000 MCG/ML injection INJECT 1 ML (CC) INTRAMUSCULARLY ONCE A WEEK 10 mL 0   cyclobenzaprine (FLEXERIL) 10 MG tablet Take 1 tablet (10 mg total) by mouth See admin instructions. Take 1 tablet by mouth three times daily as needed for muscle spasm 270 tablet 1   desvenlafaxine (PRISTIQ) 50 MG 24 hr tablet Take 1 tablet (50 mg total) by mouth [name] the morning for 7 days, THEN 2 tablets (100 mg total) [name] the morning for 23 days. 49 tablet 3   Dupilumab (DUPIXENT) 300 MG/2ML SOPN Inject 300 mg into the skin every 14 (fourteen) days. 12 mL 1   fenofibrate 160 MG tablet Take 1 tablet by mouth once daily 90 tablet 0   finasteride (PROSCAR) 5 MG tablet Take 1  tablet by mouth once daily (Patient taking differently: Take 5 mg by mouth daily.) 90 tablet 1   furosemide (LASIX) 20 MG tablet Take 1 tablet (20 mg total) by mouth daily. 90 tablet 3   gabapentin (NEURONTIN) 400 MG capsule TAKE 1 CAPSULE BY MOUTH THREE TIMES DAILY 270 capsule 0   glucose blood (ONETOUCH ULTRA) test strip USE 1 STRIP TO CHECK GLUCOSE [name] THE MORNING AND 1 AT BEDTIME AS DIRECTED 100 each 3   insulin lispro (HUMALOG) 100 UNIT/ML injection Inject 0.04 mLs (4 Units total) into the skin 3 (three) times daily with meals. (Patient not taking: Reported on 03/02/2023) 10 mL 11   ipratropium-albuterol (DUONEB) 0.5-2.5 (3) MG/3ML SOLN USE 1 AMPULE [name] NEBULIZER 4 TIMES DAILY (Patient taking differently: Take 3 mLs by nebulization See admin instructions. USE 1 AMPULE [name] NEBULIZER 4 TIMES DAILY) 360 mL 1   Lancets (ONETOUCH DELICA PLUS LANCET33G) MISC USE 1  TO CHECK GLUCOSE THREE TIMES DAILY (Patient taking differently: 1 each by Other route See admin instructions. USE 1 TO CHECK GLUCOSE THREE TIMES DAILY) 100 each 0   levothyroxine (SYNTHROID) 25 MCG tablet Take 1 tablet by mouth once daily 90 tablet 0   losartan (COZAAR) 50 MG tablet Take 1 tablet (50 mg total) by mouth daily. 90 tablet 3   magnesium chloride (SLOW-MAG) 64 MG TBEC SR tablet Take 1 tablet (64 mg total) by mouth daily. 90 tablet 3   metFORMIN (GLUCOPHAGE) 1000 MG tablet TAKE 1 TABLET BY MOUTH TWICE DAILY WITH MEALS 180 tablet 0   mexiletine (MEXITIL) 150 MG capsule Take 1 capsule (150 mg total) by mouth 2 (two) times daily. (Patient not taking: Reported on 03/02/2023) 60 capsule 3   MYRBETRIQ 25 MG TB24 tablet Take 1 tablet by mouth once daily (Patient taking differently: Take 25 mg by mouth daily.) 30 tablet 0   nitroGLYCERIN (NITROSTAT) 0.4 MG SL tablet DISSOLVE ONE TABLET UNDER THE TONGUE EVERY 5 TO 10 MINUTES PRIOR TO ACTIVITIES WHICH MIGHT PRECIPITATE AN ATTACK (Patient taking differently: Place 0.4 mg under the tongue every 5  (five) minutes as needed for chest pain.) 25 tablet 0   nystatin cream (MYCOSTATIN) Apply 1 application topically 2 (two) times daily as needed for dry skin.     Omega-3 Fatty Acids (FISH OIL) 1000 MG CAPS Take 2 capsules (2,000 mg total) by mouth [name] the morning and at bedtime. 180 capsule 12   omeprazole (PRILOSEC) 20 MG capsule Take 1 capsule by mouth once daily (Patient taking differently: Take 20 mg by mouth daily.) 90 capsule 0   oxyCODONE-acetaminophen (PERCOCET/ROXICET) 5-325 MG  tablet Take 1 tablet by mouth every 6 (six) hours as needed for severe pain. 120 tablet 0   revefenacin (YUPELRI) 175 MCG/3ML nebulizer solution Take 3 mLs (175 mcg total) by nebulization daily. 90 mL 11   rosuvastatin (CRESTOR) 40 MG tablet Take 1 tablet by mouth once daily 90 tablet 0   tamsulosin (FLOMAX) 0.4 MG CAPS capsule Take 2 capsules (0.8 mg total) by mouth daily after supper. 180 capsule 1   traZODone (DESYREL) 150 MG tablet Take 1 tablet (150 mg total) by mouth daily. (Patient taking differently: Take 75 mg by mouth at bedtime.) 270 tablet 0   VENTOLIN HFA 108 (90 Base) MCG/ACT inhaler INHALE 2 PUFFS BY MOUTH EVERY 6 HOURS AS NEEDED FOR SHORTNESS OF BREATH FOR WHEEZING (Patient taking differently: Inhale 2 puffs into the lungs See admin instructions. INHALE 2 PUFFS BY MOUTH EVERY 6 HOURS AS NEEDED FOR SHORTNESS OF BREATH FOR WHEEZING) 18 g 0   Vitamin D, Ergocalciferol, (DRISDOL) 1.25 MG (50000 UNIT) CAPS capsule TAKE 1 CAPSULE BY MOUTH TWICE A WEEK 10 capsule 0   No current facility-administered medications on file prior to visit.   Past Medical History:  Diagnosis Date   Absence of bladder continence 01/08/2022   Acute bilateral low back pain without sciatica 01/08/2022   Anxiety state 02/02/2022   Arthritis    Asymptomatic LV dysfunction 10/18/2017   B12 deficiency anemia 08/25/2021   Basal cell carcinoma    Benign prostatic hyperplasia with incomplete bladder emptying 01/14/2019   Blood [name] ear  canal, left 05/13/2021   Bone pain 03/08/2020   Cancer (HCC)    throat - 1997, throat - 2018   Cardiomyopathy, secondary (HCC) 12/01/2016   Overview:  EF 47% 12/26/16   Chest pain syndrome 11/02/2017   Chronic coronary artery disease 10/18/2017   Chronic cough 05/16/2022   Chronic ischemic heart disease 11/13/1998   Jan 22, 2003 Entered By: Aris Lot J Comment:  massive Mi [name] 2000 per patient hsitory, 2nd Mi [name] 2001Mar 11, 2004 Entered By: Aris Lot J Comment: Had stent placedin 2000,cath/angio [name] 2001   Chronic neck pain 05/16/2022   Chronic respiratory failure with hypoxia (HCC) 05/16/2022   CKD (chronic kidney disease) stage 3, GFR 30-59 ml/min (HCC) 07/25/2019   COPD (chronic obstructive pulmonary disease) (HCC)    Coronary artery disease    Coronary artery disease involving native coronary artery of native heart with angina pectoris (HCC) 12/01/2016   Overview:  He has hx of IWMI [name] remote past, last cath [name] 2012 at [facility] showed chronic total occlusion of previously stented RCA, with good collaterals, a 40% LAD stenosis, inferior hypokinesis and EF 45%.    He's been lost to Cardiology f/u since 2015, but has not had any recurrent events   Deficiency anemia 08/25/2021   Depression    Diabetes mellitus (HCC) 03/08/2020   Diabetic polyneuropathy associated with type 2 diabetes mellitus (HCC) 05/16/2022   Diaphoresis 05/16/2022   Diverticulitis 05/02/2020   Dyslipidemia 12/01/2016   Emphysema lung (HCC)    Erectile dysfunction due to diseases classified elsewhere 06/12/2019   Essential hypertension 10/18/2017   GERD (gastroesophageal reflux disease)    Hoarseness 11/15/2016   Hyperlipidemia    Hypertension    Insomnia 01/28/2021   Iron deficiency anemia due to chronic blood loss 08/25/2021   Laceration of thumb, left 12/17/2015   Left flank pain 03/14/2022   Left lower quadrant abdominal tenderness without rebound tenderness 03/14/2022   Lesion of vocal cord  Leukoplakia of larynx  11/15/2016   Long-term use of aspirin therapy 10/02/2018   Malfunction of penile prosthesis (HCC) 01/14/2019   Malignant neoplasm of skin 01/28/2021   Formatting of this note might be different from the original. Jan 22, 2003 Entered By: Aris Lot J Comment: of skin - removed x2 inpast Jan 22, 2003 Entered By: Aris Lot J Comment: of skin - removed x2 inpast   Melanoma of back (HCC)    melanoma on back   Memory loss 03/08/2020   Mood disorder [name] conditions classified elsewhere 02/02/2022   Myalgia 03/08/2020   Myocardial infarction [facility]) 2000   2 stents   Obstructive chronic bronchitis with exacerbation (HCC) 05/16/2022   Oropharyngeal dysphagia 08/12/2018   Other ill-defined and unknown causes of morbidity and mortality 11/13/1993   Formatting of this note might be different from the original. Jan 31, 2008 Entered By: ARMOUR,ROSS B Comment: lumbar, 8/08 Jan 22, 2003 Entered By: Aris Lot J Comment: x54yrs [name] work place  quit 24yrs ago [name] 2000 Jan 22, 2003 Entered By: Aris Lot J Comment: x29yrs [name] work place  quit 67yrs ago [name] 2000 Jan 31, 2008 Entered By: Bennie Pierini B Comment: lumbar, 8/08   PAD (peripheral artery disease) (HCC) 10/18/2017   Peripheral vascular disease (HCC)    iliac artery clot   Peyronie's disease 06/12/2019   Pharyngoesophageal dysphagia 08/12/2018   Pneumonia 02/2015   Pneumothorax 06/07/2020   Preoperative clearance 04/03/2018   Squamous cell carcinoma of larynx (HCC) 12/13/2016   Testicular pain, left 01/14/2019   Throat cancer (HCC)    Tobacco use disorder 06/12/2019   Past Surgical History:  Procedure Laterality Date   ABDOMINAL ANGIOGRAM N/A 01/13/2015   Procedure: ABDOMINAL ANGIOGRAM;  Surgeon: Larina Earthly, MD;  Location: [facility] CATH LAB;  Service: Cardiovascular;  Laterality: N/A;   APPENDECTOMY     BACK SURGERY     CARDIAC CATHETERIZATION  2000/2012   with stents [name] 2000   CHOLECYSTECTOMY     COLONOSCOPY     ELBOW SURGERY     bilateral    ESOPHAGOGASTRODUODENOSCOPY     KNEE SURGERY Left    MICROLARYNGOSCOPY WITH CO2 LASER AND EXCISION OF VOCAL CORD LESION N/A 11/23/2016   Procedure: MICROLARYNGOSCOPY  AND EXCISION OF VOCAL CORD LESION;  Surgeon: Suzanna Obey, MD;  Location: [facility] OR;  Service: ENT;  Laterality: N/A;   MICROLARYNGOSCOPY WITH CO2 LASER AND EXCISION OF VOCAL CORD LESION N/A 01/11/2017   Procedure: MICROLARYNGOSCOPY WITH CO2 LASER AND EXCISION OF VOCAL CORD LESION;  Surgeon: Suzanna Obey, MD;  Location: [facility] OR;  Service: ENT;  Laterality: N/A;   MICROLARYNGOSCOPY WITH LASER N/A 03/16/2015   Procedure: MICROLARYNGOSCOPY ;  Surgeon: Suzanna Obey, MD;  Location: [facility] OR;  Service: ENT;  Laterality: N/A;   peyronie's surgery     SHOULDER SURGERY Right    rotator cuff   SPINE SURGERY  09/2020   cervical surgery. steel plate, bone spurs, allograft.   THROAT SURGERY  1997   cancer removed    Family History  Problem Relation Age of Onset   Cancer Mother        bone   Hypertension Mother    Stroke Father    Hypertension Father    Healthy Child    Cancer Other        brain   Diabetes Other    Social History   Socioeconomic History   Marital status: Married    Spouse name: Not on file   Number of children: 4  Years of education: Not on file   Highest education level: GED or equivalent  Occupational History   Occupation: retired  Tobacco Use   Smoking status: Every Day    Packs/day: 1.00    Years: 50.00    Additional pack years: 0.00    Total pack years: 50.00    Types: Cigarettes   Smokeless tobacco: Never   Tobacco comments:    down to .5 ppd  11/02/2022 hfb  Vaping Use   Vaping Use: Former  Substance and Sexual Activity   Alcohol use: No    Alcohol/week: 0.0 standard drinks of alcohol   Drug use: No   Sexual activity: Not on file  Other Topics Concern   Not on file  Social History Narrative   Lives with wife       One level      Right hand   Social Determinants of Health   Financial Resource  Strain: Low Risk  (07/19/2022)   Overall Financial Resource Strain (CARDIA)    Difficulty of Paying Living Expenses: Not hard at all  Food Insecurity: No Food Insecurity (01/11/2023)   Hunger Vital Sign    Worried About Running Out of Food [name] the Last Year: Never true    Ran Out of Food [name] the Last Year: Never true  Transportation Needs: No Transportation Needs (07/19/2022)   PRAPARE - Administrator, Civil Service (Medical): No    Lack of Transportation (Non-Medical): No  Physical Activity: Sufficiently Active (01/11/2023)   Exercise Vital Sign    Days of Exercise per Week: 5 days    Minutes of Exercise per Session: 30 min  Stress: No Stress Concern Present (01/11/2023)   [employer] of Occupational Health - Occupational Stress Questionnaire    Feeling of Stress : Not at all  Social Connections: Moderately Isolated (01/11/2023)   Social Connection and Isolation Panel [NHANES]    Frequency of Communication with Friends and Family: Three times a week    Frequency of Social Gatherings with Friends and Family: Three times a week    Attends Religious Services: Never    Active Member of Clubs or Organizations: No    Attends Banker Meetings: Never    Marital Status: Married    Objective:  BP 126/68   Pulse 78   Temp (!) 97 F (36.1 C)   Resp 18   Ht 5\' 10"  (1.778 m)   Wt 207 lb (93.9 kg)   BMI 29.70 kg/m      04/03/2023    4:03 PM 02/15/2023   10:56 AM 02/05/2023   10:14 AM  BP/Weight  Systolic BP 126 108 104  Diastolic BP 68 60 56  Wt. (Lbs) 207 198 200  BMI 29.7 kg/m2 28.41 kg/m2 30.41 kg/m2    Physical Exam Vitals reviewed.  Constitutional:      Appearance: Normal appearance.  Neck:     Vascular: No carotid bruit.  Cardiovascular:     Rate and Rhythm: Normal rate and regular rhythm.     Heart sounds: Normal heart sounds.  Pulmonary:     Effort: Pulmonary effort is normal.     Breath sounds: Normal breath sounds. No wheezing, rhonchi or  rales.  Abdominal:     General: Bowel sounds are normal.     Palpations: Abdomen is soft.     Tenderness: There is no abdominal tenderness.  Neurological:     Mental Status: He is alert and oriented to person, place, and  time.  Psychiatric:        Mood and Affect: Mood normal.        Behavior: Behavior normal.     Diabetic Foot Exam - Simple   No data filed      Lab Results  Component Value Date   WBC 15.8 (H) 01/18/2023   HGB 12.1 (L) 01/18/2023   HCT 38.3 01/18/2023   PLT 279 01/18/2023   GLUCOSE 141 (H) 01/18/2023   CHOL 106 01/18/2023   TRIG 267 (H) 01/18/2023   HDL 25 (L) 01/18/2023   LDLCALC 40 01/18/2023   ALT 12 01/18/2023   AST 15 01/18/2023   NA 139 01/18/2023   K 4.8 01/18/2023   CL 98 01/18/2023   CREATININE 1.13 01/18/2023   BUN 9 01/18/2023   CO2 23 01/18/2023   TSH 3.430 11/09/2022   INR 1.05 03/16/2015   HGBA1C 8.5 (H) 01/18/2023   MICROALBUR 30 05/23/2021      Assessment & Plan:    Urinary urgency Assessment & Plan: Check UA  Orders: -     POCT URINALYSIS DIP (CLINITEK)  Mucopurulent chronic bronchitis (HCC) Assessment & Plan: No changes to breathing treatments Strongly recommend quit smoking.    Lumbar back pain Assessment & Plan: Continue current treatment.   Vertigo Assessment & Plan: Caution with movement.  Unable to go to physical therapy.       No orders of the defined types were placed [name] this encounter.   Orders Placed This Encounter  Procedures   POCT URINALYSIS DIP (CLINITEK)     Follow-up: No follow-ups on file.   I,Carolyn M Morrison,acting as a Neurosurgeon for Blane Ohara, MD.,have documented all relevant documentation on the behalf of Blane Ohara, MD,as directed by  Blane Ohara, MD while [name] the presence of Blane Ohara, MD.   An After Visit Summary was printed and given to the patient.  Blane Ohara, MD [facility] [phone]

## 2023-04-03 NOTE — Telephone Encounter (Signed)
Patient's wife called stating that She had called Cardiology and Pulmonology and they stated that the patient needed to be seen here first she states that he has been short of breath and very fatigued and he gets more short of breath when he goes to get up and when going to lay down. She states he can sleep all day. She is wanting to try to get him in to see you this week but I did inform her that we have no openings and I would have to send message to inquire. Please advise.

## 2023-04-05 ENCOUNTER — Other Ambulatory Visit: Payer: Self-pay | Admitting: Family Medicine

## 2023-04-06 ENCOUNTER — Other Ambulatory Visit: Payer: Self-pay | Admitting: Family Medicine

## 2023-04-08 ENCOUNTER — Other Ambulatory Visit: Payer: Self-pay | Admitting: Family Medicine

## 2023-04-08 ENCOUNTER — Encounter: Payer: Self-pay | Admitting: Family Medicine

## 2023-04-08 DIAGNOSIS — R3915 Urgency of urination: Secondary | ICD-10-CM | POA: Insufficient documentation

## 2023-04-08 DIAGNOSIS — R42 Dizziness and giddiness: Secondary | ICD-10-CM | POA: Insufficient documentation

## 2023-04-08 DIAGNOSIS — J411 Mucopurulent chronic bronchitis: Secondary | ICD-10-CM

## 2023-04-08 HISTORY — DX: Dizziness and giddiness: R42

## 2023-04-08 HISTORY — DX: Mucopurulent chronic bronchitis: J41.1

## 2023-04-08 HISTORY — DX: Urgency of urination: R39.15

## 2023-04-08 NOTE — Assessment & Plan Note (Addendum)
No changes to breathing treatments Strongly recommend quit smoking.

## 2023-04-08 NOTE — Assessment & Plan Note (Signed)
Caution with movement.  Unable to go to physical therapy.

## 2023-04-08 NOTE — Assessment & Plan Note (Signed)
Continue current treatment. 

## 2023-04-08 NOTE — Assessment & Plan Note (Signed)
Check UA 

## 2023-04-10 MED ORDER — OXYCODONE-ACETAMINOPHEN 5-325 MG PO TABS: 1.0000 | ORAL_TABLET | Freq: Four times a day (QID) | ORAL | 0 refills | Status: DC | PRN

## 2023-04-11 ENCOUNTER — Telehealth: Payer: Self-pay

## 2023-04-11 NOTE — Telephone Encounter (Signed)
Transition Care Management Unsuccessful Follow-up Telephone Call  Date of discharge and from where:  [facility] 5/19  Attempts:  1st Attempt  Reason for unsuccessful TCM follow-up call:  Missing or invalid number   Don [name] [facility] Guide, [facility] [phone] [address] [place], [place], [place] [address] Phone: [phone] Email: [email or web address]

## 2023-04-12 ENCOUNTER — Telehealth: Payer: Self-pay

## 2023-04-12 ENCOUNTER — Other Ambulatory Visit: Payer: Self-pay | Admitting: Family Medicine

## 2023-04-12 DIAGNOSIS — E1142 Type 2 diabetes mellitus with diabetic polyneuropathy: Secondary | ICD-10-CM

## 2023-04-12 NOTE — Telephone Encounter (Signed)
Transition Care Management Unsuccessful Follow-up Telephone Call  Date of discharge and from where:  [facility] 5/19  Attempts:  2nd Attempt  Reason for unsuccessful TCM follow-up call:  Left voice message   Lenard Forth [facility] Guide, [facility] [phone] [address] [place], [place], [place] [address] Phone: [phone] Email: [email or web address]

## 2023-04-16 ENCOUNTER — Ambulatory Visit: Payer: Medicare Other | Attending: Cardiology | Admitting: Cardiology

## 2023-04-17 ENCOUNTER — Other Ambulatory Visit: Payer: Self-pay

## 2023-04-17 ENCOUNTER — Other Ambulatory Visit: Payer: Self-pay | Admitting: Family Medicine

## 2023-04-17 DIAGNOSIS — J9610 Chronic respiratory failure, unspecified whether with hypoxia or hypercapnia: Secondary | ICD-10-CM | POA: Diagnosis not present

## 2023-04-17 DIAGNOSIS — K2100 Gastro-esophageal reflux disease with esophagitis, without bleeding: Secondary | ICD-10-CM

## 2023-04-17 DIAGNOSIS — J449 Chronic obstructive pulmonary disease, unspecified: Secondary | ICD-10-CM | POA: Diagnosis not present

## 2023-04-17 MED ORDER — OMEPRAZOLE 20 MG PO CPDR
20.0000 mg | DELAYED_RELEASE_CAPSULE | Freq: Every day | ORAL | 0 refills | Status: DC
Start: 2023-04-17 — End: 2023-07-09

## 2023-04-19 ENCOUNTER — Encounter: Payer: Self-pay | Admitting: Internal Medicine

## 2023-04-19 ENCOUNTER — Other Ambulatory Visit: Payer: Self-pay | Admitting: Family Medicine

## 2023-04-21 ENCOUNTER — Other Ambulatory Visit: Payer: Self-pay | Admitting: Family Medicine

## 2023-04-21 DIAGNOSIS — E782 Mixed hyperlipidemia: Secondary | ICD-10-CM

## 2023-04-23 ENCOUNTER — Ambulatory Visit
Admission: RE | Admit: 2023-04-23 | Discharge: 2023-04-23 | Disposition: A | Discharge status: Home / Self Care | Payer: Medicare Other | Source: Ambulatory Visit | Attending: Internal Medicine | Admitting: Internal Medicine

## 2023-04-23 DIAGNOSIS — J439 Emphysema, unspecified: Secondary | ICD-10-CM | POA: Diagnosis not present

## 2023-04-23 DIAGNOSIS — R918 Other nonspecific abnormal finding of lung field: Secondary | ICD-10-CM | POA: Diagnosis not present

## 2023-04-23 DIAGNOSIS — I700 Atherosclerosis of aorta: Secondary | ICD-10-CM | POA: Diagnosis not present

## 2023-04-26 ENCOUNTER — Encounter: Payer: Self-pay | Admitting: Internal Medicine

## 2023-04-27 NOTE — Telephone Encounter (Signed)
Pt calling in for Ct Results,

## 2023-05-01 ENCOUNTER — Other Ambulatory Visit: Payer: Self-pay | Admitting: Family Medicine

## 2023-05-08 ENCOUNTER — Other Ambulatory Visit: Payer: Self-pay | Admitting: Family Medicine

## 2023-05-10 ENCOUNTER — Other Ambulatory Visit: Payer: Self-pay | Admitting: Family Medicine

## 2023-05-14 MED ORDER — OXYCODONE-ACETAMINOPHEN 5-325 MG PO TABS: 1.0000 | ORAL_TABLET | Freq: Four times a day (QID) | ORAL | 0 refills | Status: DC | PRN

## 2023-05-17 DIAGNOSIS — J449 Chronic obstructive pulmonary disease, unspecified: Secondary | ICD-10-CM | POA: Diagnosis not present

## 2023-05-17 DIAGNOSIS — J9610 Chronic respiratory failure, unspecified whether with hypoxia or hypercapnia: Secondary | ICD-10-CM | POA: Diagnosis not present

## 2023-05-21 ENCOUNTER — Encounter: Payer: Self-pay | Admitting: Cardiology

## 2023-05-22 ENCOUNTER — Encounter: Payer: Self-pay | Admitting: Family Medicine

## 2023-05-23 ENCOUNTER — Ambulatory Visit: Payer: Medicare Other | Admitting: Cardiology

## 2023-05-24 NOTE — Progress Notes (Deleted)
  Cardiology Office Note:  .   Date:  05/24/2023  ID:  Don [name], DOB [birth year], MRN [id number] PCP: Blane Ohara, MD  [facility] Providers Cardiologist:  Garwin Brothers, MD Electrophysiologist:  Regan Lemming, MD { Click to update primary MD,subspecialty MD or APP then REFRESH:1}   History of Present Illness: .   Don [name] is a 72 y.o. male with a past medical history of cardiomyopathy, CAD, PAD, stage IV COPD, hypertension, PVD, GERD, DM 2, BPH, CKD stage III, hyperlipidemia, history of tobacco use, OSA not compliant with CPAP.  11/10/2022 echocardiogram EF 45 to 50%, no RWMA, trivial MR 05/24/2022 Lexiscan no evidence of ischemia, intermediate risk, medium defect that is fixed consistent with prior infarction  Most recently evaluated by Dr. Elberta Fortis on 02/05/2023, he had been referred for PVC burden of 17% after presenting to the chest pain with shortness of breath.  He was started on mexiletine 150 mg twice daily, and advised to return in 3 months.  ROS: ***  Studies Reviewed: .        *** Risk Assessment/Calculations:   {Does this patient have ATRIAL FIBRILLATION?[phone]} No BP recorded.  {Refresh Note OR Click here to enter BP  :1}***       Physical Exam:   VS:  There were no vitals taken for this visit.   Wt Readings from Last 3 Encounters:  04/03/23 207 lb (93.9 kg)  02/15/23 198 lb (89.8 kg)  02/05/23 200 lb (90.7 kg)    GEN: Well nourished, well developed in no acute distress NECK: No JVD; No carotid bruits CARDIAC: ***RRR, no murmurs, rubs, gallops RESPIRATORY:  Clear to auscultation without rales, wheezing or rhonchi  ABDOMEN: Soft, non-tender, non-distended EXTREMITIES:  No edema; No deformity   ASSESSMENT AND PLAN: .   ***    {Are you ordering a CV Procedure (e.g. stress test, cath, DCCV, TEE, etc)?   Press F2        :[id number]}  Dispo: ***  Signed, Flossie Dibble, NP

## 2023-05-25 ENCOUNTER — Ambulatory Visit: Payer: Medicare Other | Attending: Cardiology | Admitting: Cardiology

## 2023-05-29 ENCOUNTER — Other Ambulatory Visit: Payer: Self-pay | Admitting: Family Medicine

## 2023-05-29 DIAGNOSIS — M545 Low back pain, unspecified: Secondary | ICD-10-CM

## 2023-05-31 ENCOUNTER — Encounter: Payer: Self-pay | Admitting: Physician Assistant

## 2023-05-31 ENCOUNTER — Encounter: Payer: Self-pay | Admitting: Cardiology

## 2023-05-31 ENCOUNTER — Ambulatory Visit: Payer: Medicare Other | Attending: Cardiology | Admitting: Cardiology

## 2023-05-31 ENCOUNTER — Ambulatory Visit (INDEPENDENT_AMBULATORY_CARE_PROVIDER_SITE_OTHER): Payer: Medicare Other | Admitting: Physician Assistant

## 2023-05-31 VITALS — BP 132/78 | HR 81 | Temp 97.8°F | Ht 68.0 in | Wt 196.0 lb

## 2023-05-31 VITALS — BP 135/51 | HR 87 | Ht 68.0 in | Wt 196.4 lb

## 2023-05-31 DIAGNOSIS — I5022 Chronic systolic (congestive) heart failure: Secondary | ICD-10-CM | POA: Diagnosis not present

## 2023-05-31 DIAGNOSIS — J069 Acute upper respiratory infection, unspecified: Secondary | ICD-10-CM

## 2023-05-31 DIAGNOSIS — Z79899 Other long term (current) drug therapy: Secondary | ICD-10-CM | POA: Diagnosis not present

## 2023-05-31 DIAGNOSIS — I10 Essential (primary) hypertension: Secondary | ICD-10-CM

## 2023-05-31 DIAGNOSIS — I493 Ventricular premature depolarization: Secondary | ICD-10-CM | POA: Diagnosis not present

## 2023-05-31 DIAGNOSIS — I5020 Unspecified systolic (congestive) heart failure: Secondary | ICD-10-CM

## 2023-05-31 HISTORY — DX: Acute upper respiratory infection, unspecified: J06.9

## 2023-05-31 NOTE — Progress Notes (Signed)
Cardiology Office Note:  .   Date:  05/31/2023  ID:  Don [name], DOB 10-Dec-1950, MRN [id number] PCP: Blane Ohara, MD  [facility] Providers Cardiologist:  None Electrophysiologist:  Don Jorja Loa, MD    History of Present Illness: .   Don [name] is a 72 y.o. male with a past medical history of cardiomyopathy, CAD, PAD on Plavix, stage IV COPD, hypertension, PVD, GERD, DM 2, BPH, CKD stage III, hyperlipidemia, history of tobacco use, OSA not compliant with CPAP.  11/10/2022 echocardiogram EF 45 to 50%, no RWMA, trivial MR 05/24/2022 Lexiscan no evidence of ischemia, intermediate risk, medium defect that is fixed consistent with prior infarction  Most recently evaluated by Dr. Elberta [name] on 02/05/2023, he had been referred for PVC burden of 17% after presenting to the chest pain with shortness of breath.  He was started on mexiletine 150 mg twice daily, and advised to return in 3 months.  He presents today for his wife for evaluation of bradycardia, they checked his heart rate at home multiple times throughout the day and noticed on the pulse ox reading his heart rate is low.  Don [name] offers no complaints, states he feels as he always does, is not aware of palpitations of PVCs.  EKG today reveals his PVCs have increased in frequency, pattern consistent with bigeminy.  He was started on mexiletine per EP however his wife states he experienced hallucinations with this and they stopped this approximately 2 to 3 months ago. He denies chest pain, palpitations, dyspnea, pnd, orthopnea, n, v, dizziness, syncope, edema, weight gain, or early satiety.   ROS: Review of Systems  Constitutional: Negative.   HENT: Negative.    Eyes: Negative.   Respiratory: Negative.    Cardiovascular: Negative.   Gastrointestinal: Negative.   Genitourinary: Negative.   Musculoskeletal: Negative.   Skin: Negative.   Neurological: Negative.   Endo/Heme/Allergies: Negative.    Psychiatric/Behavioral: Negative.       Studies Reviewed: Marland Kitchen   EKG Interpretation Date/Time:  Thursday May 31 2023 14:45:58 EDT Ventricular Rate:  87 PR Interval:  176 QRS Duration:  118 QT Interval:  392 QTC Calculation: 471 R Axis:   -83  Text Interpretation: Sinus rhythm with frequent and consecutive Premature ventricular complexes Left axis deviation Right bundle branch block Septal infarct , age undetermined Abnormal ECG When compared with ECG of 10-Nov-2022 07:53, PREVIOUS ECG IS PRESENT Confirmed by Wallis Bamberg [phone]) on 05/31/2023 4:12:45 PM    Cardiac Studies & Procedures     STRESS TESTS  MYOCARDIAL PERFUSION IMAGING 05/24/2022  Narrative   The study is normal. The study is intermediate risk.   LV perfusion is abnormal. There is no evidence of ischemia. There is evidence of infarction. Defect 1: There is a medium defect with moderate reduction in uptake present in the inferior location(s) that is fixed. There is abnormal wall motion in the defect area. Consistent with infarction.   Left ventricular function is abnormal. Global function is mildly reduced. Nuclear stress EF: 49 %. The left ventricular ejection fraction is mildly decreased (45-54%). End diastolic cavity size is normal.   Prior study not available for comparison.   ECHOCARDIOGRAM  ECHOCARDIOGRAM COMPLETE 11/10/2022  Narrative ECHOCARDIOGRAM REPORT    Patient Name:   Don [name] Date of Exam: 11/10/2022 Medical Rec #:  [id number]          Height:       68.0 in Accession #:    [id number]  Weight:       198.4 lb Date of Birth:  [birth year]          BSA:          2.037 m Patient Age:    69 years           BP:           180/109 mmHg Patient Gender: M                  HR:           77 bpm. Exam Location:  Inpatient  Procedure: 2D Echo, Cardiac Doppler and Color Doppler  Indications:    Chest Pain R07.9  History:        Patient has prior history of Echocardiogram examinations,  most recent 06/03/2021. Cardiomyopathy, CAD, PAD and COPD, Signs/Symptoms:Chest Pain; Risk Factors:Hypertension, Diabetes, Dyslipidemia and Current Smoker. Cancer, CKD stage III.  Sonographer:    Lucendia Herrlich Sonographer#2:  Melissa Morford RDCS (AE, PE) Referring Phys: A CALDWELL POWELL JR  IMPRESSIONS   1. Left ventricular ejection fraction, by estimation, is 45 to 50%. The left ventricle has low normal function. The left ventricle has no regional wall motion abnormalities. Left ventricular diastolic parameters are indeterminate. 2. Right ventricular systolic function is normal. The right ventricular size is normal. 3. The mitral valve is normal in structure. Trivial mitral valve regurgitation. No evidence of mitral stenosis. 4. The aortic valve was not well visualized. Aortic valve regurgitation is not visualized. No aortic stenosis is present. 5. The inferior vena cava is normal in size with greater than 50% respiratory variability, suggesting right atrial pressure of 3 mmHg.  Comparison(s): Prior images reviewed side by side. Compared to prior echo, LVEF has decreased mildly.  FINDINGS Left Ventricle: Left ventricular ejection fraction, by estimation, is 45 to 50%. The left ventricle has low normal function. The left ventricle has no regional wall motion abnormalities. Definity contrast agent was given IV to delineate the left ventricular endocardial borders. The left ventricular internal cavity size was normal in size. There is no left ventricular hypertrophy. Left ventricular diastolic parameters are indeterminate.  Right Ventricle: The right ventricular size is normal. No increase in right ventricular wall thickness. Right ventricular systolic function is normal.  Left Atrium: Left atrial size was normal in size.  Right Atrium: Right atrial size was normal in size.  Pericardium: Trivial pericardial effusion is present.  Mitral Valve: The mitral valve is normal in structure.  Trivial mitral valve regurgitation. No evidence of mitral valve stenosis.  Tricuspid Valve: The tricuspid valve is normal in structure. Tricuspid valve regurgitation is trivial. No evidence of tricuspid stenosis.  Aortic Valve: The aortic valve was not well visualized. Aortic valve regurgitation is not visualized. No aortic stenosis is present. Aortic valve mean gradient measures 1.0 mmHg. Aortic valve peak gradient measures 2.6 mmHg. Aortic valve area, by VTI measures 4.07 cm.  Pulmonic Valve: The pulmonic valve was normal in structure. Pulmonic valve regurgitation is not visualized. No evidence of pulmonic stenosis.  Aorta: The aortic root is normal in size and structure. Ascending aorta measurements are within normal limits for age when indexed to body surface area.  Venous: The inferior vena cava is normal in size with greater than 50% respiratory variability, suggesting right atrial pressure of 3 mmHg.  IAS/Shunts: No atrial level shunt detected by color flow Doppler.   LEFT VENTRICLE PLAX 2D LVIDd:         5.80 cm   Diastology  LVIDs:         4.90 cm   LV e' medial:    4.13 cm/s LV PW:         0.90 cm   LV E/e' medial:  12.2 LV IVS:        0.90 cm   LV e' lateral:   8.92 cm/s LVOT diam:     2.50 cm   LV E/e' lateral: 5.6 LV SV:         63 LV SV Index:   31 LVOT Area:     4.91 cm   IVC IVC diam: 1.90 cm  LEFT ATRIUM           Index LA diam:      3.50 cm 1.72 cm/m LA Vol (A4C): 14.4 ml 7.07 ml/m AORTIC VALVE AV Area (Vmax):    4.11 cm AV Area (Vmean):   3.98 cm AV Area (VTI):     4.07 cm AV Vmax:           79.90 cm/s AV Vmean:          52.600 cm/s AV VTI:            0.155 m AV Peak Grad:      2.6 mmHg AV Mean Grad:      1.0 mmHg LVOT Vmax:         66.83 cm/s LVOT Vmean:        42.700 cm/s LVOT VTI:          0.129 m LVOT/AV VTI ratio: 0.83  AORTA Ao Root diam: 3.70 cm  MITRAL VALVE MV Area (PHT): 4.60 cm    SHUNTS MV Decel Time: 165 msec    Systemic VTI:   0.13 m MV E velocity: 50.20 cm/s  Systemic Diam: 2.50 cm MV A velocity: 96.30 cm/s MV E/A ratio:  0.52  Weston Brass MD Electronically signed by Weston Brass MD Signature Date/Time: 11/10/2022/3:40:59 PM    Final    MONITORS  LONG TERM MONITOR (3-14 DAYS) 12/12/2022  Narrative Patch Wear Time:  7 days and 5 hours (2024-01-10T11:42:06-499 to 2024-01-17T17:07:32-0500)  Patient had a min HR of 66 bpm, max HR of 154 bpm, and avg HR of 80 bpm. Predominant underlying rhythm was Sinus Rhythm. Intermittent Bundle Branch Block was present.  2 triggered events both sinus rhythm with frequent PVCs and trigeminy.  There were no episodes of sinus arrest pauses greater than 3 seconds or second or third-degree AV nodal block.  Isolated SVEs were rare (<1.0%), and no SVE Couplets or SVE Triplets were present. 1 run of Supraventricular Tachycardia occurred lasting 6 beats with a max rate of 154 bpm (avg 145 bpm).  There were no episodes of atrial fibrillation or flutter.  Isolated VEs were frequent (17.4%, 120577), VE Couplets were rare (<1.0%, 635), and VE Triplets were rare (<1.0%, 236). Ventricular Bigeminy and Trigeminy were present.1 run of Ventricular premature contractions occurred lasting 4 beats with a max rate of 133 bpm (avg 109 bpm).           Risk Assessment/Calculations:             Physical Exam:   VS:  BP (!) 135/51 Comment: home bp reading  Pulse 87   Ht 5\' 8"  (1.727 m)   Wt 196 lb 6.4 oz (89.1 kg)   SpO2 93%   BMI 29.86 kg/m    Wt Readings from Last 3 Encounters:  05/31/23 196 lb 6.4 oz (89.1 kg)  05/31/23 196  lb (88.9 kg)  04/03/23 207 lb (93.9 kg)    GEN: Well nourished, well developed in no acute distress NECK: No JVD; No carotid bruits CARDIAC: RRR, no murmurs, rubs, gallops RESPIRATORY:  diminished but clear ABDOMEN: Soft, non-tender, non-distended EXTREMITIES:  No edema; No deformity   ASSESSMENT AND PLAN: .   CAD - previous infarction chronic  occlusion right coronary artery, Stable with no anginal symptoms. No indication for ischemic evaluation.  Continue Plavix 75 mg daily, continue Crestor 40 mg daily, continue nitroglycerin as needed, continue fenofibrate 160 mg daily. Frequent PVCs-previous EKG appears to reveal trigeminy, today periods of bigeminy.  Previously evaluated by EP was started on mexiletine however they stopped this approximately 3 months ago due to the patient developing hallucinations.  Stated they were waiting to hear back about an alternative.  I Don reach out to Dr. Elberta [name] to see if he has any further suggestions.  They were previously sent a monitor to wear after initiation of mexiletine however they did not use this as they did not continue the mexiletine and just recently sent the monitor back.  HFmrEF - NYHA class II, euvolemic, continue losartan 50 mg daily, continue Lasix 20 mg daily. HLD -most recent LDL was 40 however triglycerides were 267, this appears to be managed by PCP.  Continue Crestor 40 mg daily, continue fenofibrate 160 mg daily.       Dispo: CBC, BMET, Mag, TSH. Don see if he can get in with EP and reach out to Dr. Elberta [name] as well. Keep f/u with Dr. Dulce Sellar.   Signed, Flossie Dibble, NP

## 2023-05-31 NOTE — Progress Notes (Signed)
Acute Office Visit  Subjective:    Patient ID: Don [name], male    DOB: 11/14/1950, 72 y.o.   MRN: [id number]  Chief Complaint  Patient presents with   URI    HPI: Patient is in today for congestion, cough and some shob x 1 week, taking breathing tx's and zyrtec. Wife states they are going out of town to [place] to move her father here to [facility]. Denies anyone else being sick around the home, no fevers, chills, night sweats. Is only able to do night time breathing treatments along with a rescue inhaler.   Past Medical History:  Diagnosis Date   Absence of bladder continence 01/08/2022   Acute bilateral low back pain without sciatica 01/08/2022   Anxiety state 02/02/2022   Arthritis    Asymptomatic LV dysfunction 10/18/2017   B12 deficiency anemia 08/25/2021   Basal cell carcinoma    Benign prostatic hyperplasia with incomplete bladder emptying 01/14/2019   Blood in ear canal, left 05/13/2021   Bone pain 03/08/2020   Cancer (HCC)    throat - 1997, throat - 2018   Cardiomyopathy, secondary (HCC) 12/01/2016   Overview:  EF 47% 12/26/16   Chest pain syndrome 11/02/2017   Chronic coronary artery disease 10/18/2017   Chronic cough 05/16/2022   Chronic ischemic heart disease 11/13/1998   Jan 22, 2003 Entered By: Aris Lot J Comment:  massive Mi in 2000 per patient hsitory, 2nd Mi in 2001Mar 11, 2004 Entered By: Aris Lot J Comment: Had stent placedin 2000,cath/angio in 2001   Chronic neck pain 05/16/2022   Chronic respiratory failure with hypoxia (HCC) 05/16/2022   CKD (chronic kidney disease) stage 3, GFR 30-59 ml/min (HCC) 07/25/2019   COPD (chronic obstructive pulmonary disease) (HCC)    Coronary artery disease    Coronary artery disease involving native coronary artery of native heart with angina pectoris (HCC) 12/01/2016   Overview:  He has hx of IWMI in remote past, last cath in 2012 at [facility] showed chronic total occlusion of previously stented RCA, with good collaterals, a 40%  LAD stenosis, inferior hypokinesis and EF 45%.    He's been lost to Cardiology f/u since 2015, but has not had any recurrent events   Deficiency anemia 08/25/2021   Depression    Diabetes mellitus (HCC) 03/08/2020   Diabetic polyneuropathy associated with type 2 diabetes mellitus (HCC) 05/16/2022   Diaphoresis 05/16/2022   Diverticulitis 05/02/2020   Dyslipidemia 12/01/2016   Emphysema lung (HCC)    Erectile dysfunction due to diseases classified elsewhere 06/12/2019   Essential hypertension 10/18/2017   GERD (gastroesophageal reflux disease)    Hoarseness 11/15/2016   Hyperlipidemia    Hypertension    Insomnia 01/28/2021   Iron deficiency anemia due to chronic blood loss 08/25/2021   Laceration of thumb, left 12/17/2015   Left flank pain 03/14/2022   Left lower quadrant abdominal tenderness without rebound tenderness 03/14/2022   Lesion of vocal cord    Leukoplakia of larynx 11/15/2016   Long-term use of aspirin therapy 10/02/2018   Malfunction of penile prosthesis (HCC) 01/14/2019   Malignant neoplasm of skin 01/28/2021   Formatting of this note might be different from the original. Jan 22, 2003 Entered By: Aris Lot J Comment: of skin - removed x2 inpast Jan 22, 2003 Entered By: Aris Lot J Comment: of skin - removed x2 inpast   Melanoma of back (HCC)    melanoma on back   Memory loss 03/08/2020   Mood disorder in conditions classified elsewhere 02/02/2022  Myalgia 03/08/2020   Myocardial infarction [facility]) 2000   2 stents   Obstructive chronic bronchitis with exacerbation (HCC) 05/16/2022   Oropharyngeal dysphagia 08/12/2018   Other ill-defined and unknown causes of morbidity and mortality 11/13/1993   Formatting of this note might be different from the original. Jan 31, 2008 Entered By: ARMOUR,ROSS B Comment: lumbar, 8/08 Jan 22, 2003 Entered By: Aris Lot J Comment: x68yrs in work place  quit 4yrs ago in 2000 Jan 22, 2003 Entered By: Aris Lot J Comment: x43yrs in work place   quit 69yrs ago in 2000 Jan 31, 2008 Entered By: Bennie Pierini B Comment: lumbar, 8/08   PAD (peripheral artery disease) (HCC) 10/18/2017   Peripheral vascular disease (HCC)    iliac artery clot   Peyronie's disease 06/12/2019   Pharyngoesophageal dysphagia 08/12/2018   Pneumonia 02/2015   Pneumothorax 06/07/2020   Preoperative clearance 04/03/2018   Squamous cell carcinoma of larynx (HCC) 12/13/2016   Testicular pain, left 01/14/2019   Throat cancer (HCC)    Tobacco use disorder 06/12/2019    Past Surgical History:  Procedure Laterality Date   ABDOMINAL ANGIOGRAM N/A 01/13/2015   Procedure: ABDOMINAL ANGIOGRAM;  Surgeon: Larina Earthly, MD;  Location: [facility] CATH LAB;  Service: Cardiovascular;  Laterality: N/A;   APPENDECTOMY     BACK SURGERY     CARDIAC CATHETERIZATION  2000/2012   with stents in 2000   CHOLECYSTECTOMY     COLONOSCOPY     ELBOW SURGERY     bilateral   ESOPHAGOGASTRODUODENOSCOPY     KNEE SURGERY Left    MICROLARYNGOSCOPY WITH CO2 LASER AND EXCISION OF VOCAL CORD LESION N/A 11/23/2016   Procedure: MICROLARYNGOSCOPY  AND EXCISION OF VOCAL CORD LESION;  Surgeon: Suzanna Obey, MD;  Location: [facility] OR;  Service: ENT;  Laterality: N/A;   MICROLARYNGOSCOPY WITH CO2 LASER AND EXCISION OF VOCAL CORD LESION N/A 01/11/2017   Procedure: MICROLARYNGOSCOPY WITH CO2 LASER AND EXCISION OF VOCAL CORD LESION;  Surgeon: Suzanna Obey, MD;  Location: [facility] OR;  Service: ENT;  Laterality: N/A;   MICROLARYNGOSCOPY WITH LASER N/A 03/16/2015   Procedure: MICROLARYNGOSCOPY ;  Surgeon: Suzanna Obey, MD;  Location: [facility] OR;  Service: ENT;  Laterality: N/A;   peyronie's surgery     SHOULDER SURGERY Right    rotator cuff   SPINE SURGERY  09/2020   cervical surgery. steel plate, bone spurs, allograft.   THROAT SURGERY  1997   cancer removed    Family History  Problem Relation Age of Onset   Cancer Mother        bone   Hypertension Mother    Stroke Father    Hypertension Father    Healthy Child    Cancer  Other        brain   Diabetes Other     Social History   Socioeconomic History   Marital status: Married    Spouse name: Not on file   Number of children: 4   Years of education: Not on file   Highest education level: GED or equivalent  Occupational History   Occupation: retired  Tobacco Use   Smoking status: Every Day    Current packs/day: 1.00    Average packs/day: 1 pack/day for 50.0 years (50.0 ttl pk-yrs)    Types: Cigarettes   Smokeless tobacco: Never   Tobacco comments:    down to .5 ppd  11/02/2022 hfb  Vaping Use   Vaping status: Former  Substance and Sexual Activity   Alcohol use: No  Alcohol/week: 0.0 standard drinks of alcohol   Drug use: No   Sexual activity: Not on file  Other Topics Concern   Not on file  Social History Narrative   Lives with wife       One level      Right hand   Social Determinants of Health   Financial Resource Strain: Low Risk  (07/19/2022)   Overall Financial Resource Strain (CARDIA)    Difficulty of Paying Living Expenses: Not hard at all  Food Insecurity: Low Risk  (02/27/2023)   [facility]   Food vital sign    Within the past 12 months, you worried that your food would run out before you got money to buy more: Never true    Within the past 12 months, the food you bought just didn't last and you didn't have money to get more. : Never true  Transportation Needs: No Transportation Needs (02/27/2023)   [facility]   Transportation    In the past 12 months, has lack of reliable transportation kept you from medical appointments, meetings, work or from getting things needed for daily living? : No  Physical Activity: Sufficiently Active (01/11/2023)   Exercise Vital Sign    Days of Exercise per Week: 5 days    Minutes of Exercise per Session: 30 min  Stress: No Stress Concern Present (01/11/2023)   [employer] of Occupational Health - Occupational Stress  Questionnaire    Feeling of Stress : Not at all  Social Connections: Moderately Isolated (01/11/2023)   Social Connection and Isolation Panel [NHANES]    Frequency of Communication with Friends and Family: Three times a week    Frequency of Social Gatherings with Friends and Family: Three times a week    Attends Religious Services: Never    Active Member of Clubs or Organizations: No    Attends Banker Meetings: Never    Marital Status: Married  Catering manager Violence: Not At Risk (01/11/2023)   Humiliation, Afraid, Rape, and Kick questionnaire    Fear of Current or Ex-Partner: No    Emotionally Abused: No    Physically Abused: No    Sexually Abused: No    Outpatient Medications Prior to Visit  Medication Sig Dispense Refill   ALPRAZolam (XANAX) 0.25 MG tablet TAKE 1 TABLET BY MOUTH ONCE DAILY AS NEEDED FOR ANXIETY 30 tablet 2   arformoterol (BROVANA) 15 MCG/2ML NEBU Take 2 mLs (15 mcg total) by nebulization 2 (two) times daily. 120 mL 6   budesonide (PULMICORT) 0.25 MG/2ML nebulizer solution Take 2 mLs (0.25 mg total) by nebulization in the morning and at bedtime. 60 mL 11   clopidogrel (PLAVIX) 75 MG tablet Take 1 tablet by mouth once daily 90 tablet 0   cyanocobalamin (VITAMIN B12) 1000 MCG/ML injection INJECT 1 ML (CC) INTRAMUSCULARLY ONCE A WEEK 10 mL 0   cyclobenzaprine (FLEXERIL) 10 MG tablet Take 1 tablet (10 mg total) by mouth See admin instructions. Take 1 tablet by mouth three times daily as needed for muscle spasm 270 tablet 1   desvenlafaxine (PRISTIQ) 50 MG 24 hr tablet Take 1 tablet (50 mg total) by mouth in the morning for 7 days, THEN 2 tablets (100 mg total) in the morning for 23 days. 49 tablet 3   Dupilumab (DUPIXENT) 300 MG/2ML SOPN Inject 300 mg into the skin every 14 (fourteen) days. 12 mL 1   fenofibrate 160 MG tablet Take 1 tablet  by mouth once daily 90 tablet 0   finasteride (PROSCAR) 5 MG tablet Take 1 tablet by mouth once daily 90 tablet 0    furosemide (LASIX) 20 MG tablet Take 1 tablet (20 mg total) by mouth daily. 90 tablet 3   gabapentin (NEURONTIN) 400 MG capsule TAKE 1 CAPSULE BY MOUTH THREE TIMES DAILY 270 capsule 0   glucose blood (ONETOUCH ULTRA) test strip USE 1 STRIP TO CHECK GLUCOSE IN THE MORNING AND 1 AT BEDTIME AS DIRECTED 100 each 3   insulin lispro (HUMALOG) 100 UNIT/ML injection Inject 0.04 mLs (4 Units total) into the skin 3 (three) times daily with meals. (Patient not taking: Reported on 03/02/2023) 10 mL 11   ipratropium-albuterol (DUONEB) 0.5-2.5 (3) MG/3ML SOLN USE 1 AMPULE IN NEBULIZER 4 TIMES DAILY (Patient taking differently: Take 3 mLs by nebulization See admin instructions. USE 1 AMPULE IN NEBULIZER 4 TIMES DAILY) 360 mL 1   Lancets (ONETOUCH DELICA PLUS LANCET33G) MISC USE 1  TO CHECK GLUCOSE THREE TIMES DAILY (Patient taking differently: 1 each by Other route See admin instructions. USE 1 TO CHECK GLUCOSE THREE TIMES DAILY) 100 each 0   levothyroxine (SYNTHROID) 25 MCG tablet Take 1 tablet by mouth once daily 90 tablet 0   losartan (COZAAR) 50 MG tablet Take 1 tablet (50 mg total) by mouth daily. 90 tablet 3   magnesium chloride (SLOW-MAG) 64 MG TBEC SR tablet Take 1 tablet (64 mg total) by mouth daily. 90 tablet 3   metFORMIN (GLUCOPHAGE) 1000 MG tablet TAKE 1 TABLET BY MOUTH TWICE DAILY WITH MEALS 180 tablet 0   MYRBETRIQ 25 MG TB24 tablet Take 1 tablet by mouth once daily (Patient taking differently: Take 25 mg by mouth daily.) 30 tablet 0   nitroGLYCERIN (NITROSTAT) 0.4 MG SL tablet DISSOLVE ONE TABLET UNDER THE TONGUE EVERY 5 TO 10 MINUTES PRIOR TO ACTIVITIES WHICH MIGHT PRECIPITATE AN ATTACK (Patient taking differently: Place 0.4 mg under the tongue every 5 (five) minutes as needed for chest pain.) 25 tablet 0   nystatin cream (MYCOSTATIN) Apply 1 application topically 2 (two) times daily as needed for dry skin.     Omega-3 Fatty Acids (FISH OIL) 1000 MG CAPS Take 2 capsules (2,000 mg total) by mouth in the  morning and at bedtime. 180 capsule 12   omeprazole (PRILOSEC) 20 MG capsule Take 1 capsule (20 mg total) by mouth daily. 90 capsule 0   oxyCODONE-acetaminophen (PERCOCET/ROXICET) 5-325 MG tablet Take 1 tablet by mouth every 6 (six) hours as needed for severe pain. 120 tablet 0   revefenacin (YUPELRI) 175 MCG/3ML nebulizer solution Take 3 mLs (175 mcg total) by nebulization daily. 90 mL 11   rosuvastatin (CRESTOR) 40 MG tablet Take 1 tablet by mouth once daily 90 tablet 0   tamsulosin (FLOMAX) 0.4 MG CAPS capsule Take 2 capsules (0.8 mg total) by mouth daily after supper. 180 capsule 1   TOUJEO SOLOSTAR 300 UNIT/ML Solostar Pen INJECT 30 UNITS SUBCUTANEOUSLY ONCE DAILY 6 mL 0   traZODone (DESYREL) 150 MG tablet Take 1 tablet (150 mg total) by mouth daily. (Patient taking differently: Take 75 mg by mouth at bedtime.) 270 tablet 0   VENTOLIN HFA 108 (90 Base) MCG/ACT inhaler INHALE 2 PUFFS BY MOUTH EVERY 6 HOURS AS NEEDED FOR SHORTNESS OF BREATH FOR WHEEZING (Patient taking differently: Inhale 2 puffs into the lungs See admin instructions. INHALE 2 PUFFS BY MOUTH EVERY 6 HOURS AS NEEDED FOR SHORTNESS OF BREATH FOR WHEEZING) 18 g 0  Vitamin D, Ergocalciferol, (DRISDOL) 1.25 MG (50000 UNIT) CAPS capsule TAKE 1 CAPSULE BY MOUTH TWICE A WEEK 12 capsule 3   mexiletine (MEXITIL) 150 MG capsule Take 1 capsule (150 mg total) by mouth 2 (two) times daily. (Patient not taking: Reported on 03/02/2023) 60 capsule 3   No facility-administered medications prior to visit.    Allergies  Allergen Reactions   Penicillins Rash and Hives    Has patient had a PCN reaction causing immediate rash, facial/tongue/throat swelling, SOB or lightheadedness with hypotension:unsure Has patient had a PCN reaction causing severe rash involving mucus membranes or skin necrosis:unsure Has patient had a PCN reaction that required hospitalization:No Has patient had a PCN reaction occurring within the last 10 years:No If all of the  above answers are "NO", then may proceed with Cephalosporin use.  rash   Bactrim [Sulfamethoxazole-Trimethoprim]     Dizziness, confusion, involuntary movements   Doxycycline Rash   Iodinated Contrast Media Rash    Also developed blisters   Penicillin G Rash   Tramadol Rash    Review of Systems  Constitutional:  Negative for chills, fatigue and fever.  HENT:  Positive for congestion and rhinorrhea. Negative for ear pain, postnasal drip, sinus pressure, sinus pain and sore throat.   Respiratory:  Positive for cough and shortness of breath.   Cardiovascular:  Negative for chest pain.  Gastrointestinal:  Negative for diarrhea, nausea and vomiting.  Neurological:  Negative for dizziness and headaches.       Objective:        05/31/2023   10:33 AM 04/03/2023    4:03 PM 02/15/2023   10:56 AM  Vitals with BMI  Height 5\' 8"  5\' 10"  5\' 10"   Weight 196 lbs 207 lbs 198 lbs  BMI 29.81 29.7 28.41  Systolic 132 126 616  Diastolic 78 68 60  Pulse 81 78 85    No data found.   Physical Exam Constitutional:      Appearance: Normal appearance.  HENT:     Right Ear: Tympanic membrane, ear canal and external ear normal.     Left Ear: Tympanic membrane, ear canal and external ear normal.     Nose: Nose normal. No congestion or rhinorrhea.     Mouth/Throat:     Mouth: Mucous membranes are moist.     Pharynx: No oropharyngeal exudate or posterior oropharyngeal erythema.  Cardiovascular:     Rate and Rhythm: Normal rate and regular rhythm.     Heart sounds: Normal heart sounds.  Pulmonary:     Effort: Pulmonary effort is normal. No respiratory distress.     Breath sounds: Normal breath sounds. No wheezing, rhonchi or rales.  Abdominal:     General: Bowel sounds are normal.     Palpations: Abdomen is soft.     Tenderness: There is no abdominal tenderness.  Lymphadenopathy:     Cervical: No cervical adenopathy.  Neurological:     Mental Status: He is alert.  Psychiatric:        Mood  and Affect: Mood normal.        Behavior: Behavior normal.     Health Maintenance Due  Topic Date Due   OPHTHALMOLOGY EXAM  Never done   Hepatitis C Screening  Never done   Zoster Vaccines- Shingrix (1 of 2) Never done   COVID-19 Vaccine (3 - Mixed Product risk series) 10/20/2022    There are no preventive care reminders to display for this patient.   Lab Results  Component  Value Date   TSH 3.430 11/09/2022   Lab Results  Component Value Date   WBC 15.8 (H) 01/18/2023   HGB 12.1 (L) 01/18/2023   HCT 38.3 01/18/2023   MCV 78 (L) 01/18/2023   PLT 279 01/18/2023   Lab Results  Component Value Date   NA 139 01/18/2023   K 4.8 01/18/2023   CO2 23 01/18/2023   GLUCOSE 141 (H) 01/18/2023   BUN 9 01/18/2023   CREATININE 1.13 01/18/2023   BILITOT 0.3 01/18/2023   ALKPHOS 93 01/18/2023   AST 15 01/18/2023   ALT 12 01/18/2023   PROT 6.8 01/18/2023   ALBUMIN 4.1 01/18/2023   CALCIUM 9.3 01/18/2023   ANIONGAP 7 11/10/2022   EGFR 69 01/18/2023   GFR 74.13 08/17/2022   Lab Results  Component Value Date   CHOL 106 01/18/2023   Lab Results  Component Value Date   HDL 25 (L) 01/18/2023   Lab Results  Component Value Date   LDLCALC 40 01/18/2023   Lab Results  Component Value Date   TRIG 267 (H) 01/18/2023   Lab Results  Component Value Date   CHOLHDL 4.2 01/18/2023   Lab Results  Component Value Date   HGBA1C 8.5 (H) 01/18/2023       Assessment & Plan:  URI, acute Assessment & Plan: Seems  viral in nature Continue to monitor symptoms Use OTC medications to help with symptom control Continue nebulizer treatments as directed Follow up if symptoms persist      No orders of the defined types were placed in this encounter.   No orders of the defined types were placed in this encounter.    Follow-up: No follow-ups on file.  An After Visit Summary was printed and given to the patient.  Langley Gauss, [facility] [phone]

## 2023-05-31 NOTE — Patient Instructions (Signed)
Medication Instructions:  Your physician recommends that you continue on your current medications as directed. Please refer to the Current Medication list given to you today.  *If you need a refill on your cardiac medications before your next appointment, please call your pharmacy*   Lab Work: Your physician recommends that you return for lab work in: Today for BMP, Magnesium, CBC and TSH  If you have labs (blood work) drawn today and your tests are completely normal, you will receive your results only by: MyChart Message (if you have MyChart) OR A paper copy in the mail If you have any lab test that is abnormal or we need to change your treatment, we will call you to review the results.   Testing/Procedures: NONE   Follow-Up: At [facility], you and your health needs are our priority.  As part of our continuing mission to provide you with exceptional heart care, we have created designated Provider Care Teams.  These Care Teams include your primary Cardiologist (physician) and Advanced Practice Providers (APPs -  Physician Assistants and Nurse Practitioners) who all work together to provide you with the care you need, when you need it.  We recommend signing up for the patient portal called "MyChart".  Sign up information is provided on this After Visit Summary.  MyChart is used to connect with patients for Virtual Visits (Telemedicine).  Patients are able to view lab/test results, encounter notes, upcoming appointments, etc.  Non-urgent messages can be sent to your provider as well.   To learn more about what you can do with MyChart, go to [email or web address].    Your next appointment:  Next available appt    Provider:   Loman Brooklyn MD     Other Instructions

## 2023-05-31 NOTE — Assessment & Plan Note (Signed)
Seems  viral in nature Continue to monitor symptoms Use OTC medications to help with symptom control Continue nebulizer treatments as directed Follow up if symptoms persist

## 2023-06-01 LAB — CBC WITH DIFFERENTIAL/PLATELET
Basophils Absolute: 0.1 x10E3/uL (ref 0.0–0.2)
Basos: 1 %
EOS (ABSOLUTE): 0.3 x10E3/uL (ref 0.0–0.4)
Eos: 3 %
Hematocrit: 40.5 % (ref 37.5–51.0)
Hemoglobin: 12.6 g/dL — ABNORMAL LOW (ref 13.0–17.7)
Immature Grans (Abs): 0 x10E3/uL (ref 0.0–0.1)
Immature Granulocytes: 0 %
Lymphocytes Absolute: 2.9 x10E3/uL (ref 0.7–3.1)
Lymphs: 29 %
MCH: 24.8 pg — ABNORMAL LOW (ref 26.6–33.0)
MCHC: 31.1 g/dL — ABNORMAL LOW (ref 31.5–35.7)
MCV: 80 fL (ref 79–97)
Monocytes Absolute: 0.8 x10E3/uL (ref 0.1–0.9)
Monocytes: 8 %
Neutrophils Absolute: 6 x10E3/uL (ref 1.4–7.0)
Neutrophils: 59 %
Platelets: 253 x10E3/uL (ref 150–450)
RBC: 5.08 x10E6/uL (ref 4.14–5.80)
RDW: 15.9 % — ABNORMAL HIGH (ref 11.6–15.4)
WBC: 10.1 x10E3/uL (ref 3.4–10.8)

## 2023-06-01 LAB — TSH: TSH: 2.2 u[IU]/mL (ref 0.450–4.500)

## 2023-06-01 LAB — MAGNESIUM: Magnesium: 1.6 mg/dL (ref 1.6–2.3)

## 2023-06-01 LAB — BASIC METABOLIC PANEL WITH GFR
BUN/Creatinine Ratio: 12 (ref 10–24)
BUN: 11 mg/dL (ref 8–27)
CO2: 24 mmol/L (ref 20–29)
Calcium: 9.4 mg/dL (ref 8.6–10.2)
Chloride: 100 mmol/L (ref 96–106)
Creatinine, Ser: 0.92 mg/dL (ref 0.76–1.27)
Glucose: 148 mg/dL — ABNORMAL HIGH (ref 70–99)
Potassium: 4.1 mmol/L (ref 3.5–5.2)
Sodium: 143 mmol/L (ref 134–144)
eGFR: 88 mL/min/1.73

## 2023-06-04 ENCOUNTER — Telehealth: Payer: Self-pay | Admitting: *Deleted

## 2023-06-04 NOTE — Telephone Encounter (Signed)
Spoke with pt's wife. Gave lab results. She verbalized understanding and had no questions.

## 2023-06-04 NOTE — Telephone Encounter (Signed)
-----   Message from Flossie Dibble sent at 06/01/2023 10:25 AM EDT ----- Labs look stable. Nothing that would cause increased amount of PVCs. I have not heard back from Dr. Elberta Fortis yet, if he responds I will let you know what he says, looks like you will see him on the 29th as well.

## 2023-06-08 ENCOUNTER — Other Ambulatory Visit: Payer: Self-pay | Admitting: Family Medicine

## 2023-06-09 ENCOUNTER — Other Ambulatory Visit: Payer: Self-pay | Admitting: Physician Assistant

## 2023-06-09 ENCOUNTER — Other Ambulatory Visit: Payer: Self-pay | Admitting: Family Medicine

## 2023-06-09 DIAGNOSIS — J069 Acute upper respiratory infection, unspecified: Secondary | ICD-10-CM

## 2023-06-09 MED ORDER — PREDNISONE 20 MG PO TABS
ORAL_TABLET | ORAL | 0 refills | Status: AC
Start: 2023-06-09 — End: 2023-06-17

## 2023-06-09 MED ORDER — LEVOFLOXACIN 500 MG PO TABS
500.0000 mg | ORAL_TABLET | Freq: Every day | ORAL | 0 refills | Status: AC
Start: 2023-06-09 — End: 2023-06-16

## 2023-06-11 ENCOUNTER — Ambulatory Visit: Payer: Medicare Other | Attending: Cardiology

## 2023-06-11 ENCOUNTER — Encounter: Payer: Self-pay | Admitting: Cardiology

## 2023-06-11 ENCOUNTER — Ambulatory Visit: Payer: Medicare Other | Attending: Cardiology | Admitting: Cardiology

## 2023-06-11 VITALS — BP 136/74 | HR 58 | Ht 68.0 in | Wt 196.8 lb

## 2023-06-11 DIAGNOSIS — I2510 Atherosclerotic heart disease of native coronary artery without angina pectoris: Secondary | ICD-10-CM | POA: Diagnosis not present

## 2023-06-11 DIAGNOSIS — I493 Ventricular premature depolarization: Secondary | ICD-10-CM

## 2023-06-11 DIAGNOSIS — I5022 Chronic systolic (congestive) heart failure: Secondary | ICD-10-CM

## 2023-06-11 MED ORDER — AMIODARONE HCL 200 MG PO TABS: ORAL_TABLET | ORAL | 1 refills | Status: DC

## 2023-06-11 NOTE — Patient Instructions (Addendum)
Medication Instructions:  Your physician has recommended you make the following change in your medication:  START Amiodarone  -- start this the day after you stop taking Levaquin  - take 2 tablets (400 mg total) TWICE a day for 2 weeks, then  - take 1 tablet (200 mg total) TWICE a day for 2 weeks, then  - take 1 tablet (200 mg total) ONCE a day   *If you need a refill on your cardiac medications before your next appointment, please call your pharmacy*   Lab Work: None ordered   Testing/Procedures:                           ZIO XT- Long Term Monitor Instructions  Your physician has requested you wear a ZIO patch monitor for 14 days (you will start this in 2-3 weeks).  This is a single patch monitor. [employer] supplies one patch monitor per enrollment. Additional stickers are not available. Please do not apply patch if you will be having a Nuclear Stress Test,  Echocardiogram, Cardiac CT, MRI, or Chest Xray during the period you would be wearing the  monitor. The patch cannot be worn during these tests. You cannot remove and re-apply the  ZIO XT patch monitor.  Your ZIO patch monitor will be mailed 3 day USPS to your address on file. It may take 3-5 days  to receive your monitor after you have been enrolled.  Once you have received your monitor, please review the enclosed instructions. Your monitor  has already been registered assigning a specific monitor serial # to you.  Billing and Patient Assistance Program Information  We have supplied [employer] with any of your insurance information on file for billing purposes. [employer] offers a sliding scale Patient Assistance Program for patients that do not have  insurance, or whose insurance does not completely cover the cost of the ZIO monitor.  You must apply for the Patient Assistance Program to qualify for this discounted rate.  To apply, please call [employer] at [phone], select option 4, select option 2, ask to apply for  Patient  Assistance Program. Meredeth Ide will ask your household income, and how many people  are in your household. They will quote your out-of-pocket cost based on that information.  [employer] will also be able to set up a 37-month, interest-free payment plan if needed.  Applying the monitor   Shave hair from upper left chest.  Hold abrader disc by orange tab. Rub abrader in 40 strokes over the upper left chest as  indicated in your monitor instructions.  Clean area with 4 enclosed alcohol pads. Let dry.  Apply patch as indicated in monitor instructions. Patch will be placed under collarbone on left  side of chest with arrow pointing upward.  Rub patch adhesive wings for 2 minutes. Remove white label marked "1". Remove the white  label marked "2". Rub patch adhesive wings for 2 additional minutes.  While looking in a mirror, press and release button in center of patch. A small green light will  flash 3-4 times. This will be your only indicator that the monitor has been turned on.  Do not shower for the first 24 hours. You may shower after the first 24 hours.  Press the button if you feel a symptom. You will hear a small click. Record Date, Time and  Symptom in the Patient Logbook.  When you are ready to remove the patch, follow instructions on the last  2 pages of Patient  Logbook. Stick patch monitor onto the last page of Patient Logbook.  Place Patient Logbook in the blue and white box. Use locking tab on box and tape box closed  securely. The blue and white box has prepaid postage on it. Please place it in the mailbox as  soon as possible. Your physician should have your test results approximately 7 days after the  monitor has been mailed back to [facility].  Call [facility] Customer Care at [phone] if you have questions regarding  your ZIO XT patch monitor. Call them immediately if you see an orange light blinking on your  monitor.  If your monitor falls off in less than 4 days,  contact our Monitor department at [phone].  If your monitor becomes loose or falls off after 4 days call [employer] at [phone] for  suggestions on securing your monitor    Follow-Up: At [facility], you and your health needs are our priority.  As part of our continuing mission to provide you with exceptional heart care, we have created designated Provider Care Teams.  These Care Teams include your primary Cardiologist (physician) and Advanced Practice Providers (APPs -  Physician Assistants and Nurse Practitioners) who all work together to provide you with the care you need, when you need it.  Your next appointment:   6 month(s)  The format for your next appointment:   In Person  Provider:   Loman Brooklyn, MD    Thank you for choosing [facility]!!   Dory Horn, RN [phone]  Other Instructions  Amiodarone Tablets What is this medication? AMIODARONE (a MEE oh da rone) prevents and treats a fast or irregular heartbeat (arrhythmia). It works by slowing down overactive electric signals in the heart, which stabilizes your heart rhythm. It belongs to a group of medications called antiarrhythmics. This medicine may be used for other purposes; ask your health care provider or pharmacist if you have questions. COMMON BRAND NAME(S): Cordarone, Pacerone What should I tell my care team before I take this medication? They need to know if you have any of these conditions: Liver disease Lung disease Other heart problems Thyroid disease An unusual or allergic reaction to amiodarone, iodine, other medications, foods, dyes, or preservatives Pregnant or trying to get pregnant Breast-feeding How should I use this medication? Take this medication by mouth with water. Take it as directed on the prescription label at the same time every day. You can take it with or without food. You should always take it the same way. Keep taking it unless your care team tells you to stop. A  special MedGuide will be given to you by the pharmacist with each prescription and refill. Be sure to read this information carefully each time. Talk to your care team about the use of this medication in children. Special care may be needed. Overdosage: If you think you have taken too much of this medicine contact a poison control center or emergency room at once. NOTE: This medicine is only for you. Do not share this medicine with others. What if I miss a dose? If you miss a dose, take it as soon as you can. If it is almost time for your next dose, take only that dose. Do not take double or extra doses. What may interact with this medication? Do not take this medication with any of the following: Abarelix Apomorphine Arsenic trioxide Certain antibiotics, such as erythromycin, gemifloxacin, levofloxacin, or pentamidine Certain medications for depression, such  as amoxapine or tricyclic antidepressants Certain medications for fungal infections, such as fluconazole, itraconazole, ketoconazole, posaconazole, or voriconazole Certain medications for irregular heartbeat, such as disopyramide, dronedarone, ibutilide, propafenone, or sotalol Certain medications for malaria, such as chloroquine or halofantrine Cisapride Droperidol Haloperidol [place]orn Maprotiline Methadone Phenothiazines, such as chlorpromazine, mesoridazine, or thioridazine Pimozide Ranolazine Red yeast rice Vardenafil This medication may also interact with the following: Antivirals for HIV Certain medications for blood pressure, heart disease, irregular heartbeat Certain medications for cholesterol, such as atorvastatin, cerivastatin, lovastatin, or simvastatin Certain medications for hepatitis C, such as sofosbuvir and ledipasvir; sofosbuvir Certain medications for seizures, such as phenytoin Certain medications for thyroid problems Certain medications that prevent or treat blood clots, such as  warfarin Cholestyramine Cimetidine Clopidogrel Cyclosporine Dextromethorphan Diuretics Dofetilide Fentanyl General anesthetics Grapefruit juice Lidocaine Loratadine Methotrexate Other medications that cause heart rhythm changes Procainamide Quinidine Rifabutin, rifampin, or rifapentine St. John's Wort Trazodone Ziprasidone This list may not describe all possible interactions. Give your health care provider a list of all the medicines, herbs, non-prescription drugs, or dietary supplements you use. Also tell them if you smoke, drink alcohol, or use illegal drugs. Some items may interact with your medicine. What should I watch for while using this medication? Your condition will be monitored closely when you first begin therapy. This medication is often started in a hospital or other monitored health care setting. Once you are on maintenance therapy, visit your care team for regular checks on your progress. Because your condition and use of this medication carry some risk, it is a good idea to carry an identification card, necklace, or bracelet with details of your condition, medications, and care team. This medication may affect your coordination, reaction time, or judgment. Do not drive or operate machinery until you know how this medication affects you. Sit up or stand slowly to reduce the risk of dizzy or fainting spells. Drinking alcohol with this medication can increase the risk of these side effects. This medication can make you more sensitive to the sun. Keep out of the sun. If you cannot avoid being in the sun, wear protective clothing and sunscreen. Do not use sun lamps, tanning beds, or tanning booths. You should have regular eye exams before and during treatment. Call your care team if you have blurred vision, see halos, or your eyes become sensitive to light. Your eyes may get dry. It may be helpful to use a lubricating eye solution or artificial tears solution. If you are going  to have surgery or a procedure that requires contrast dyes, tell your care team that you are taking this medication. What side effects may I notice from receiving this medication? Side effects that you should report to your care team as soon as possible: Allergic reactions--skin rash, itching, hives, swelling of the face, lips, tongue, or throat Bluish-gray skin Change in vision such as blurry vision, seeing halos around lights, vision loss Heart failure--shortness of breath, swelling of the ankles, feet, or hands, sudden weight gain, unusual weakness or fatigue Heart rhythm changes--fast or irregular heartbeat, dizziness, feeling faint or lightheaded, chest pain, trouble breathing High thyroid levels (hyperthyroidism)--fast or irregular heartbeat, weight loss, excessive sweating or sensitivity to heat, tremors or shaking, anxiety, nervousness, irregular menstrual cycle or spotting Liver injury--right upper belly pain, loss of appetite, nausea, light-colored stool, dark yellow or brown urine, yellowing skin or eyes, unusual weakness or fatigue Low thyroid levels (hypothyroidism)--unusual weakness or fatigue, sensitivity to cold, constipation, hair loss, dry skin, weight gain, feelings  of depression Lung injury--shortness of breath or trouble breathing, cough, spitting up blood, chest pain, fever Pain, tingling, or numbness in the hands or feet, muscle weakness, trouble walking, loss of balance or coordination Side effects that usually do not require medical attention (report to your care team if they continue or are bothersome): Nausea Vomiting This list may not describe all possible side effects. Call your doctor for medical advice about side effects. You may report side effects to FDA at 1-800-FDA-1088. Where should I keep my medication? Keep out of the reach of children and pets. Store at room temperature between 20 and 25 degrees C (68 and 77 degrees F). Protect from light. Keep container  tightly closed. Throw away any unused medication after the expiration date. NOTE: This sheet is a summary. It may not cover all possible information. If you have questions about this medicine, talk to your doctor, pharmacist, or health care provider.  2024 Elsevier/Gold Standard (2022-05-19 00:00:00)

## 2023-06-11 NOTE — Progress Notes (Unsigned)
Enrolled for [employer] to mail a ZIO XT long term holter monitor to the patients address on file.  

## 2023-06-11 NOTE — Progress Notes (Signed)
  Electrophysiology Office Note:   Date:  06/11/2023  ID:  Don [name], DOB [birth year], MRN [id number]  Primary Cardiologist: None Electrophysiologist: Tamaria Dunleavy Jorja Loa, MD      History of Present Illness:   Don [name] is a 72 y.o. male with h/o coronary artery disease, PVCs seen today for routine electrophysiology followup.  Since last being seen in our clinic the patient reports doing well.  For the most part, he can do his daily activities.  He does have a few days where he feels weak, fatigued, short of breath.  He presented to cardiology clinic on 1 of these days and was noted to have an elevated PVC burden.  When he feels well, he is without symptoms he was initially started on mexiletine, but had altered mental status and thus the drug was stopped.  he denies chest pain, palpitations, dyspnea, PND, orthopnea, nausea, vomiting, dizziness, syncope, edema, weight gain, or early satiety.   Review of systems complete and found to be negative unless listed in HPI.   EP Information / Studies Reviewed:    EKG is not ordered today. EKG from 05/31/23 reviewed which showed Don [name] rhythm, right bundle branch block, PVCs        Risk Assessment/Calculations:              Physical Exam:   VS:  BP 136/74   Pulse (!) 58   Ht 5\' 8"  (1.727 m)   Wt 196 lb 12.8 oz (89.3 kg)   SpO2 93%   BMI 29.92 kg/m    Wt Readings from Last 3 Encounters:  06/11/23 196 lb 12.8 oz (89.3 kg)  05/31/23 196 lb 6.4 oz (89.1 kg)  05/31/23 196 lb (88.9 kg)     GEN: Well nourished, well developed in no acute distress NECK: No JVD; No carotid bruits CARDIAC: Regular rate and rhythm with occasional ectopy, no murmurs, rubs, gallops RESPIRATORY:  Clear to auscultation without rales, wheezing or rhonchi  ABDOMEN: Soft, non-tender, non-distended EXTREMITIES:  No edema; No deformity   ASSESSMENT AND PLAN:    1.  Frequent PVCs: Elevated burden on cardiac monitor at 17%.  PVCs appear to be coming  from the inferoposterior left ventricle.  He was initially on mexiletine, but did not tolerate this due to altered mental status.  I have offered him ablation versus initiation of amiodarone.  At this point, he would prefer to avoid procedures.  Don [name] start amiodarone.  Don [name] plan for repeat cardiac monitor after amiodarone load.  2.  Coronary artery disease: Previous infarction in RCA occlusion.  No ischemic symptoms.  Plan per primary cardiology.  3.  Chronic systolic heart failure: Ejection fraction 45 to 50%.  Potentially due to RCA infarction as well as PVCs  4.  Hyperlipidemia: Continue statin per primary cardiology and PCP.  Goal LDL less than 70  Follow up with Dr. Elberta Fortis in 6 months  Signed, Shameka Aggarwal Jorja Loa, MD

## 2023-06-12 ENCOUNTER — Other Ambulatory Visit: Payer: Self-pay

## 2023-06-12 MED ORDER — OXYCODONE-ACETAMINOPHEN 5-325 MG PO TABS: 1.0000 | ORAL_TABLET | Freq: Four times a day (QID) | ORAL | 0 refills | Status: DC | PRN

## 2023-06-13 ENCOUNTER — Other Ambulatory Visit: Payer: Self-pay | Admitting: Family Medicine

## 2023-06-14 MED ORDER — OXYCODONE-ACETAMINOPHEN 5-325 MG PO TABS: 1.0000 | ORAL_TABLET | Freq: Four times a day (QID) | ORAL | 0 refills | Status: DC | PRN

## 2023-06-17 DIAGNOSIS — J9610 Chronic respiratory failure, unspecified whether with hypoxia or hypercapnia: Secondary | ICD-10-CM | POA: Diagnosis not present

## 2023-06-17 DIAGNOSIS — J449 Chronic obstructive pulmonary disease, unspecified: Secondary | ICD-10-CM | POA: Diagnosis not present

## 2023-06-18 ENCOUNTER — Encounter: Payer: Self-pay | Admitting: Cardiology

## 2023-06-18 NOTE — Telephone Encounter (Signed)
Left detailed message asking to call back/mychart back

## 2023-06-23 ENCOUNTER — Telehealth: Payer: Self-pay | Admitting: Student

## 2023-06-23 DIAGNOSIS — I493 Ventricular premature depolarization: Secondary | ICD-10-CM | POA: Diagnosis not present

## 2023-06-23 NOTE — Telephone Encounter (Signed)
   Patient's wife called Answering Service with concern of low heart rates. Called and spoke with wife with patient in the background. Patient has a history of frequent PVCs. He was recently seen by Dr. Elberta Fortis and started on Amiodarone. Plan was for monitor after Amiodarone load. However, after starting Amiodarone, patient's O2 sats started to drop. He has a history of COPD on home O2 PRN and had to start using his home O2 again. Currently requiring 3 L. They reached out to Dr. Elberta Fortis who recommended stopping the Amiodarone. Heart rates currently around 35 on pulse oximeter and home BP machine. She states his heart rates intermittently drops to the 30s. However, other than his shortness of breath, he is not having any symptoms. He denies any lightheadedness, dizziness, near syncope, or chest pain. He denies any weight gain or edema so suspect shortness of breath is more due to underlying lung disease. I suspect that the low heart rates on pulse oximeter and BP machine are probably due to frequent PVCs. However, advised that the only way to say this for sure is to get an EKG. Patient does not want to come to the ED. Advised going ahead and placing outpatient monitor that was ordered by Dr. Elberta Fortis (2 week Zio XT monitor) and advised patient to come to the ED if he develops any lightheadedness/ dizziness or near syncope/ syncope or if shortness of breath worsens. Patient's wife voiced understanding and agreed.  Corrin Parker, PA-C 06/23/2023 8:04 AM

## 2023-06-25 ENCOUNTER — Encounter: Payer: Self-pay | Admitting: Pulmonary Disease

## 2023-06-25 NOTE — Progress Notes (Unsigned)
Cardiology Office Note:    Date:  06/26/2023   ID:  Don [name], DOB 1951/03/02, MRN [id number]  PCP:  Blane Ohara, MD  Cardiologist:  Norman Herrlich, MD    Referring MD: Blane Ohara, MD    ASSESSMENT:    1. Frequent PVCs   2. RBBB (right bundle branch block with left anterior fascicular block)   3. Coronary artery disease involving native coronary artery of native heart without angina pectoris   4. Essential hypertension   5. Mixed hyperlipidemia    PLAN:    In order of problems listed above:  He is not doing well DC amiodarone refer back to EP for consideration of ablation Change to torsemide higher dose although he has no edema neck vein distention has typical symptoms of heart failure check proBNP level and recheck echocardiogram regarding cardiomyopathy if EF is further reduced would need to be placed on other guideline directed treatment Stable CAD BP at target continue his medical treatment including his ARB Continue his statin   Next appointment: 3 months   Medication Adjustments/Labs and Tests Ordered: Current medicines are reviewed at length with the patient today.  Concerns regarding medicines are outlined above.  No orders of the defined types were placed in this encounter.  No orders of the defined types were placed in this encounter.    History of Present Illness:    Don [name] is a 72 y.o. male with a hx of frequent PVCs right bundle branch block mild LV dysfunction EF 45 to 50% CAD hypertension hyperlipidemia panlobular emphysema and type 2 diabetes with diabetic polyneuropathy last seen 209 2024.  He has a history of previous inferior wall myocardial infarction coronary angiography in 2012 showed chronic occlusion of the right coronary artery at site of previous stent with good collaterals 40% LAD stenosis inferior hypokinesia and ejection fraction 45%.I had seen him 12/15/2018 my records relate that he has a history of cardiomyopathy and  coronary artery disease with previous mild reduced ejection fraction and no findings of congestive heart failure his echocardiogram at that time showed EF in the range of 45 to 50%.  Has been seen by EP did not do well with mexiletine and was initiated on amiodarone 06/11/2023.  Compliance with diet, lifestyle and medications: Yes  His daughter is present participates in evaluation decision making He sweats profusely and cannot keep the event monitor on his chest She checks his pulse at home and it is in the 30s she has been quite concerned and will purchase the mobile Kardia device. She feels he is more short of breath and his oxygenation is worse he was off amiodarone she has resumed 200 mg a day and is concerned about whether he can tolerate this long-term I think we should abandon antiarrhythmic drugs I sent a note to Dr. Elberta Fortis to consider EP ablation. He is more short of breath has orthopnea will transition to torsemide higher dose check labs including proBNP and recheck his echocardiogram 9 months ago he had mild LV dysfunction he may have progressive cardiomyopathy Past Medical History:  Diagnosis Date   Absence of bladder continence 01/08/2022   Acute bilateral low back pain without sciatica 01/08/2022   Anxiety state 02/02/2022   Arthritis    Asymptomatic LV dysfunction 10/18/2017   B12 deficiency anemia 08/25/2021   Basal cell carcinoma    Benign prostatic hyperplasia with incomplete bladder emptying 01/14/2019   Blood in ear canal, left 05/13/2021   Bone pain 03/08/2020  Cancer (HCC)    throat - 1997, throat - 2018   Cardiomyopathy, secondary (HCC) 12/01/2016   Overview:  EF 47% 12/26/16   Chest pain syndrome 11/02/2017   Chronic coronary artery disease 10/18/2017   Chronic cough 05/16/2022   Chronic ischemic heart disease 11/13/1998   Jan 22, 2003 Entered By: Aris Lot J Comment:  massive Mi in 2000 per patient hsitory, 2nd Mi in 2001Mar 11, 2004 Entered By: Karsten Ro  Comment: Had stent placedin 2000,cath/angio in 2001   Chronic neck pain 05/16/2022   Chronic respiratory failure with hypoxia (HCC) 05/16/2022   CKD (chronic kidney disease) stage 3, GFR 30-59 ml/min (HCC) 07/25/2019   COPD (chronic obstructive pulmonary disease) (HCC)    Coronary artery disease    Coronary artery disease involving native coronary artery of native heart with angina pectoris (HCC) 12/01/2016   Overview:  He has hx of IWMI in remote past, last cath in 2012 at [facility] showed chronic total occlusion of previously stented RCA, with good collaterals, a 40% LAD stenosis, inferior hypokinesis and EF 45%.    He's been lost to Cardiology f/u since 2015, but has not had any recurrent events   Deficiency anemia 08/25/2021   Depression    Diabetes mellitus (HCC) 03/08/2020   Diabetic polyneuropathy associated with type 2 diabetes mellitus (HCC) 05/16/2022   Diaphoresis 05/16/2022   Diverticulitis 05/02/2020   Dyslipidemia 12/01/2016   Emphysema lung (HCC)    Erectile dysfunction due to diseases classified elsewhere 06/12/2019   Essential hypertension 10/18/2017   GERD (gastroesophageal reflux disease)    Hoarseness 11/15/2016   Hyperlipidemia    Hypertension    Insomnia 01/28/2021   Iron deficiency anemia due to chronic blood loss 08/25/2021   Laceration of thumb, left 12/17/2015   Left flank pain 03/14/2022   Left lower quadrant abdominal tenderness without rebound tenderness 03/14/2022   Lesion of vocal cord    Leukoplakia of larynx 11/15/2016   Long-term use of aspirin therapy 10/02/2018   Malfunction of penile prosthesis (HCC) 01/14/2019   Malignant neoplasm of skin 01/28/2021   Formatting of this note might be different from the original. Jan 22, 2003 Entered By: Aris Lot J Comment: of skin - removed x2 inpast Jan 22, 2003 Entered By: Aris Lot J Comment: of skin - removed x2 inpast   Melanoma of back (HCC)    melanoma on back   Memory loss 03/08/2020   Mood disorder in conditions  classified elsewhere 02/02/2022   Myalgia 03/08/2020   Myocardial infarction [facility]) 2000   2 stents   Obstructive chronic bronchitis with exacerbation (HCC) 05/16/2022   Oropharyngeal dysphagia 08/12/2018   Other ill-defined and unknown causes of morbidity and mortality 11/13/1993   Formatting of this note might be different from the original. Jan 31, 2008 Entered By: ARMOUR,ROSS B Comment: lumbar, 8/08 Jan 22, 2003 Entered By: Aris Lot J Comment: x10yrs in work place  quit 57yrs ago in 2000 Jan 22, 2003 Entered By: Aris Lot J Comment: x68yrs in work place  quit 36yrs ago in 2000 Jan 31, 2008 Entered By: Bennie Pierini B Comment: lumbar, 8/08   PAD (peripheral artery disease) (HCC) 10/18/2017   Peripheral vascular disease (HCC)    iliac artery clot   Peyronie's disease 06/12/2019   Pharyngoesophageal dysphagia 08/12/2018   Pneumonia 02/2015   Pneumothorax 06/07/2020   Preoperative clearance 04/03/2018   Squamous cell carcinoma of larynx (HCC) 12/13/2016   Testicular pain, left 01/14/2019   Throat cancer (HCC)    Tobacco use disorder 06/12/2019  Current Medications: Current Meds  Medication Sig   ALPRAZolam (XANAX) 0.25 MG tablet TAKE 1 TABLET BY MOUTH ONCE DAILY AS NEEDED FOR ANXIETY   amiodarone (PACERONE) 200 MG tablet Take 2 tablets (400 mg total) TWICE a day for 2 weeks, take 1 tablet (200 mg total) TWICE a day for 2 weeks, then take 1 tablet (200 mg total) ONCE a day   arformoterol (BROVANA) 15 MCG/2ML NEBU Take 2 mLs (15 mcg total) by nebulization 2 (two) times daily.   budesonide (PULMICORT) 0.25 MG/2ML nebulizer solution Take 2 mLs (0.25 mg total) by nebulization in the morning and at bedtime.   clopidogrel (PLAVIX) 75 MG tablet Take 1 tablet by mouth once daily   cyanocobalamin (VITAMIN B12) 1000 MCG/ML injection INJECT 1 ML (CC) INTRAMUSCULARLY ONCE A WEEK   cyclobenzaprine (FLEXERIL) 10 MG tablet TAKE  TABLET BY MOUTH THREE TIMES DAILY AS NEEDED FOR MUSCLE SPASM   Dupilumab  (DUPIXENT) 300 MG/2ML SOPN Inject 300 mg into the skin every 14 (fourteen) days.   fenofibrate 160 MG tablet Take 1 tablet by mouth once daily   finasteride (PROSCAR) 5 MG tablet Take 1 tablet by mouth once daily   furosemide (LASIX) 20 MG tablet Take 1 tablet (20 mg total) by mouth daily.   gabapentin (NEURONTIN) 400 MG capsule TAKE 1 CAPSULE BY MOUTH THREE TIMES DAILY   insulin lispro (HUMALOG) 100 UNIT/ML injection Inject 0.04 mLs (4 Units total) into the skin 3 (three) times daily with meals.   ipratropium-albuterol (DUONEB) 0.5-2.5 (3) MG/3ML SOLN USE 1 AMPULE IN NEBULIZER 4 TIMES DAILY   Lancets (ONETOUCH DELICA PLUS LANCET33G) MISC USE 1  TO CHECK GLUCOSE THREE TIMES DAILY   levothyroxine (SYNTHROID) 25 MCG tablet Take 1 tablet by mouth once daily   losartan (COZAAR) 50 MG tablet Take 1 tablet (50 mg total) by mouth daily.   magnesium chloride (SLOW-MAG) 64 MG TBEC SR tablet Take 1 tablet (64 mg total) by mouth daily.   metFORMIN (GLUCOPHAGE) 1000 MG tablet TAKE 1 TABLET BY MOUTH TWICE DAILY WITH MEALS   MYRBETRIQ 25 MG TB24 tablet Take 1 tablet by mouth once daily   nitroGLYCERIN (NITROSTAT) 0.4 MG SL tablet DISSOLVE ONE TABLET UNDER THE TONGUE EVERY 5 TO 10 MINUTES PRIOR TO ACTIVITIES WHICH MIGHT PRECIPITATE AN ATTACK   nystatin cream (MYCOSTATIN) Apply 1 application topically 2 (two) times daily as needed for dry skin.   Omega-3 Fatty Acids (FISH OIL) 1000 MG CAPS Take 2 capsules (2,000 mg total) by mouth in the morning and at bedtime.   omeprazole (PRILOSEC) 20 MG capsule Take 1 capsule (20 mg total) by mouth daily.   ONETOUCH ULTRA TEST test strip USE 1 STRIP TO CHECK GLUCOSE IN THE MORNING AND 1 AT BEDTIME AS DIRECTED   oxyCODONE-acetaminophen (PERCOCET/ROXICET) 5-325 MG tablet Take 1 tablet by mouth every 6 (six) hours as needed for severe pain.   revefenacin (YUPELRI) 175 MCG/3ML nebulizer solution Take 3 mLs (175 mcg total) by nebulization daily.   rosuvastatin (CRESTOR) 40 MG  tablet Take 1 tablet by mouth once daily   tamsulosin (FLOMAX) 0.4 MG CAPS capsule Take 2 capsules (0.8 mg total) by mouth daily after supper.   TOUJEO SOLOSTAR 300 UNIT/ML Solostar Pen INJECT 30 UNITS SUBCUTANEOUSLY ONCE DAILY   traZODone (DESYREL) 150 MG tablet Take 1 tablet (150 mg total) by mouth daily.   VENTOLIN HFA 108 (90 Base) MCG/ACT inhaler INHALE 2 PUFFS BY MOUTH EVERY 6 HOURS AS NEEDED FOR SHORTNESS OF BREATH  FOR WHEEZING   Vitamin D, Ergocalciferol, (DRISDOL) 1.25 MG (50000 UNIT) CAPS capsule TAKE 1 CAPSULE BY MOUTH TWICE A WEEK      EKGs/Labs/Other Studies Reviewed:    The following studies were reviewed today:  Cardiac Studies & Procedures     STRESS TESTS  MYOCARDIAL PERFUSION IMAGING 05/24/2022  Narrative   The study is normal. The study is intermediate risk.   LV perfusion is abnormal. There is no evidence of ischemia. There is evidence of infarction. Defect 1: There is a medium defect with moderate reduction in uptake present in the inferior location(s) that is fixed. There is abnormal wall motion in the defect area. Consistent with infarction.   Left ventricular function is abnormal. Global function is mildly reduced. Nuclear stress EF: 49 %. The left ventricular ejection fraction is mildly decreased (45-54%). End diastolic cavity size is normal.   Prior study not available for comparison.   ECHOCARDIOGRAM  ECHOCARDIOGRAM COMPLETE 11/10/2022  Narrative ECHOCARDIOGRAM REPORT    Patient Name:   Don [name] Date of Exam: 11/10/2022 Medical Rec #:  [id number]          Height:       68.0 in Accession #:    [id number]         Weight:       198.4 lb Date of Birth:  1951-10-07          BSA:          2.037 m Patient Age:    71 years           BP:           180/109 mmHg Patient Gender: M                  HR:           77 bpm. Exam Location:  Inpatient  Procedure: 2D Echo, Cardiac Doppler and Color Doppler  Indications:    Chest Pain R07.9  History:         Patient has prior history of Echocardiogram examinations, most recent 06/03/2021. Cardiomyopathy, CAD, PAD and COPD, Signs/Symptoms:Chest Pain; Risk Factors:Hypertension, Diabetes, Dyslipidemia and Current Smoker. Cancer, CKD stage III.  Sonographer:    Lucendia Herrlich Sonographer#2:  Melissa Morford RDCS (AE, PE) Referring Phys: A CALDWELL POWELL JR  IMPRESSIONS   1. Left ventricular ejection fraction, by estimation, is 45 to 50%. The left ventricle has low normal function. The left ventricle has no regional wall motion abnormalities. Left ventricular diastolic parameters are indeterminate. 2. Right ventricular systolic function is normal. The right ventricular size is normal. 3. The mitral valve is normal in structure. Trivial mitral valve regurgitation. No evidence of mitral stenosis. 4. The aortic valve was not well visualized. Aortic valve regurgitation is not visualized. No aortic stenosis is present. 5. The inferior vena cava is normal in size with greater than 50% respiratory variability, suggesting right atrial pressure of 3 mmHg.  Comparison(s): Prior images reviewed side by side. Compared to prior echo, LVEF has decreased mildly.  FINDINGS Left Ventricle: Left ventricular ejection fraction, by estimation, is 45 to 50%. The left ventricle has low normal function. The left ventricle has no regional wall motion abnormalities. Definity contrast agent was given IV to delineate the left ventricular endocardial borders. The left ventricular internal cavity size was normal in size. There is no left ventricular hypertrophy. Left ventricular diastolic parameters are indeterminate.  Right Ventricle: The right ventricular size is normal. No increase in right ventricular wall  thickness. Right ventricular systolic function is normal.  Left Atrium: Left atrial size was normal in size.  Right Atrium: Right atrial size was normal in size.  Pericardium: Trivial pericardial effusion is  present.  Mitral Valve: The mitral valve is normal in structure. Trivial mitral valve regurgitation. No evidence of mitral valve stenosis.  Tricuspid Valve: The tricuspid valve is normal in structure. Tricuspid valve regurgitation is trivial. No evidence of tricuspid stenosis.  Aortic Valve: The aortic valve was not well visualized. Aortic valve regurgitation is not visualized. No aortic stenosis is present. Aortic valve mean gradient measures 1.0 mmHg. Aortic valve peak gradient measures 2.6 mmHg. Aortic valve area, by VTI measures 4.07 cm.  Pulmonic Valve: The pulmonic valve was normal in structure. Pulmonic valve regurgitation is not visualized. No evidence of pulmonic stenosis.  Aorta: The aortic root is normal in size and structure. Ascending aorta measurements are within normal limits for age when indexed to body surface area.  Venous: The inferior vena cava is normal in size with greater than 50% respiratory variability, suggesting right atrial pressure of 3 mmHg.  IAS/Shunts: No atrial level shunt detected by color flow Doppler.   LEFT VENTRICLE PLAX 2D LVIDd:         5.80 cm   Diastology LVIDs:         4.90 cm   LV e' medial:    4.13 cm/s LV PW:         0.90 cm   LV E/e' medial:  12.2 LV IVS:        0.90 cm   LV e' lateral:   8.92 cm/s LVOT diam:     2.50 cm   LV E/e' lateral: 5.6 LV SV:         63 LV SV Index:   31 LVOT Area:     4.91 cm   IVC IVC diam: 1.90 cm  LEFT ATRIUM           Index LA diam:      3.50 cm 1.72 cm/m LA Vol (A4C): 14.4 ml 7.07 ml/m AORTIC VALVE AV Area (Vmax):    4.11 cm AV Area (Vmean):   3.98 cm AV Area (VTI):     4.07 cm AV Vmax:           79.90 cm/s AV Vmean:          52.600 cm/s AV VTI:            0.155 m AV Peak Grad:      2.6 mmHg AV Mean Grad:      1.0 mmHg LVOT Vmax:         66.83 cm/s LVOT Vmean:        42.700 cm/s LVOT VTI:          0.129 m LVOT/AV VTI ratio: 0.83  AORTA Ao Root diam: 3.70 cm  MITRAL VALVE MV Area  (PHT): 4.60 cm    SHUNTS MV Decel Time: 165 msec    Systemic VTI:  0.13 m MV E velocity: 50.20 cm/s  Systemic Diam: 2.50 cm MV A velocity: 96.30 cm/s MV E/A ratio:  0.52  Weston Brass MD Electronically signed by Weston Brass MD Signature Date/Time: 11/10/2022/3:40:59 PM    Final    MONITORS  LONG TERM MONITOR (3-14 DAYS) 12/12/2022  Narrative Patch Wear Time:  7 days and 5 hours (2024-01-10T11:42:06-499 to 2024-01-17T17:07:32-0500)  Patient had a min HR of 66 bpm, max HR of 154 bpm, and avg HR of 80  bpm. Predominant underlying rhythm was Sinus Rhythm. Intermittent Bundle Branch Block was present.  2 triggered events both sinus rhythm with frequent PVCs and trigeminy.  There were no episodes of sinus arrest pauses greater than 3 seconds or second or third-degree AV nodal block.  Isolated SVEs were rare (<1.0%), and no SVE Couplets or SVE Triplets were present. 1 run of Supraventricular Tachycardia occurred lasting 6 beats with a max rate of 154 bpm (avg 145 bpm).  There were no episodes of atrial fibrillation or flutter.  Isolated VEs were frequent (17.4%, 120577), VE Couplets were rare (<1.0%, 635), and VE Triplets were rare (<1.0%, 236). Ventricular Bigeminy and Trigeminy were present.1 run of Ventricular premature contractions occurred lasting 4 beats with a max rate of 133 bpm (avg 109 bpm).               Recent Labs: 11/09/2022: B Natriuretic Peptide 216.5 01/18/2023: ALT 12 02/15/2023: Pro B Natriuretic peptide (BNP) 70.0 05/31/2023: BUN 11; Creatinine, Ser 0.92; Hemoglobin 12.6; Magnesium 1.6; Platelets 253; Potassium 4.1; Sodium 143; TSH 2.200  Recent Lipid Panel    Component Value Date/Time   CHOL 106 01/18/2023 0804   TRIG 267 (H) 01/18/2023 0804   HDL 25 (L) 01/18/2023 0804   CHOLHDL 4.2 01/18/2023 0804   LDLCALC 40 01/18/2023 0804    Physical Exam:    VS:  BP 124/60 (BP Location: Right Arm, Patient Position: Sitting, Cuff Size: Normal)   Pulse (!) 54    Ht 5\' 10"  (1.778 m)   Wt 199 lb 3.2 oz (90.4 kg)   SpO2 94%   BMI 28.58 kg/m     Wt Readings from Last 3 Encounters:  06/26/23 199 lb 3.2 oz (90.4 kg)  06/11/23 196 lb 12.8 oz (89.3 kg)  05/31/23 196 lb 6.4 oz (89.1 kg)     GEN: COPD appearance examined in a wheelchair well nourished, well developed in no acute distress HEENT: Normal NECK: No JVD; No carotid bruits LYMPHATICS: No lymphadenopathy CARDIAC: RRR, no murmurs, rubs, gallops RESPIRATORY:  Clear to auscultation without rales, wheezing or rhonchi  ABDOMEN: Soft, non-tender, non-distended MUSCULOSKELETAL:  No edema; No deformity  SKIN: Warm and dry NEUROLOGIC:  Alert and oriented x 3 PSYCHIATRIC:  Normal affect    Signed, Norman Herrlich, MD  06/26/2023 11:51 AM    [facility]

## 2023-06-26 ENCOUNTER — Ambulatory Visit (INDEPENDENT_AMBULATORY_CARE_PROVIDER_SITE_OTHER): Payer: Medicare Other

## 2023-06-26 ENCOUNTER — Encounter: Payer: Self-pay | Admitting: Pulmonary Disease

## 2023-06-26 ENCOUNTER — Ambulatory Visit: Payer: Medicare Other | Admitting: Pulmonary Disease

## 2023-06-26 ENCOUNTER — Encounter: Payer: Self-pay | Admitting: Cardiology

## 2023-06-26 ENCOUNTER — Ambulatory Visit: Payer: Medicare Other | Attending: Cardiology | Admitting: Cardiology

## 2023-06-26 VITALS — BP 124/60 | HR 54 | Ht 70.0 in | Wt 199.2 lb

## 2023-06-26 DIAGNOSIS — I452 Bifascicular block: Secondary | ICD-10-CM

## 2023-06-26 DIAGNOSIS — L578 Other skin changes due to chronic exposure to nonionizing radiation: Secondary | ICD-10-CM | POA: Diagnosis not present

## 2023-06-26 DIAGNOSIS — E782 Mixed hyperlipidemia: Secondary | ICD-10-CM

## 2023-06-26 DIAGNOSIS — I2510 Atherosclerotic heart disease of native coronary artery without angina pectoris: Secondary | ICD-10-CM

## 2023-06-26 DIAGNOSIS — I493 Ventricular premature depolarization: Secondary | ICD-10-CM

## 2023-06-26 DIAGNOSIS — R918 Other nonspecific abnormal finding of lung field: Secondary | ICD-10-CM | POA: Diagnosis not present

## 2023-06-26 DIAGNOSIS — J449 Chronic obstructive pulmonary disease, unspecified: Secondary | ICD-10-CM

## 2023-06-26 DIAGNOSIS — R053 Chronic cough: Secondary | ICD-10-CM

## 2023-06-26 DIAGNOSIS — F17200 Nicotine dependence, unspecified, uncomplicated: Secondary | ICD-10-CM | POA: Diagnosis not present

## 2023-06-26 DIAGNOSIS — I10 Essential (primary) hypertension: Secondary | ICD-10-CM | POA: Diagnosis not present

## 2023-06-26 DIAGNOSIS — L821 Other seborrheic keratosis: Secondary | ICD-10-CM | POA: Diagnosis not present

## 2023-06-26 DIAGNOSIS — Z85820 Personal history of malignant melanoma of skin: Secondary | ICD-10-CM | POA: Diagnosis not present

## 2023-06-26 DIAGNOSIS — L820 Inflamed seborrheic keratosis: Secondary | ICD-10-CM | POA: Diagnosis not present

## 2023-06-26 MED ORDER — TORSEMIDE 20 MG PO TABS: 20.0000 mg | ORAL_TABLET | Freq: Every day | ORAL | 3 refills | Status: DC

## 2023-06-26 NOTE — Patient Instructions (Signed)
We will get a chest x-ray on you today  Your lungs do sound clear,  Give [place] a call if any fevers or chills to suggest is an infection and we may consider on antibiotics  Keep your appointment with Dr. Marchelle Gearing  Continue to work on quitting smoking

## 2023-06-26 NOTE — Addendum Note (Signed)
Addended by: Roxanne Mins I on: 06/26/2023 12:14 PM   Modules accepted: Orders

## 2023-06-26 NOTE — Patient Instructions (Addendum)
Medication Instructions:  Your physician has recommended you make the following change in your medication:   STOP: Furosemide STOP: Amiodarone START: Torsemide 20 mg daily   *If you need a refill on your cardiac medications before your next appointment, please call your pharmacy*   Lab Work: Your physician recommends that you return for lab work in:   Labs today: BMP, Pro BNP, CBC  If you have labs (blood work) drawn today and your tests are completely normal, you will receive your results only by: MyChart Message (if you have MyChart) OR A paper copy in the mail If you have any lab test that is abnormal or we need to change your treatment, we will call you to review the results.   Testing/Procedures: Your physician has requested that you have an echocardiogram. Echocardiography is a painless test that uses sound waves to create images of your heart. It provides your doctor with information about the size and shape of your heart and how well your heart's chambers and valves are working. This procedure takes approximately one hour. There are no restrictions for this procedure. Please do NOT wear cologne, perfume, aftershave, or lotions (deodorant is allowed). Please arrive 15 minutes prior to your appointment time.    Follow-Up: At [facility], you and your health needs are our priority.  As part of our continuing mission to provide you with exceptional heart care, we have created designated Provider Care Teams.  These Care Teams include your primary Cardiologist (physician) and Advanced Practice Providers (APPs -  Physician Assistants and Nurse Practitioners) who all work together to provide you with the care you need, when you need it.  We recommend signing up for the patient portal called "MyChart".  Sign up information is provided on this After Visit Summary.  MyChart is used to connect with patients for Virtual Visits (Telemedicine).  Patients are able to view lab/test  results, encounter notes, upcoming appointments, etc.  Non-urgent messages can be sent to your provider as well.   To learn more about what you can do with MyChart, go to [email or web address].    Your next appointment:   6 week(s)  Provider:   Norman Herrlich, MD    Other Instructions None      KardiaMobile [email or web address]        FDA-cleared, clinical grade mobile EKG monitor: Lourena Simmonds is the most clinically-validated mobile EKG used by the world's leading cardiac care medical professionals With Basic service, know instantly if your heart rhythm is normal or if atrial fibrillation is detected, and email the last single EKG recording to yourself or your doctor Premium service, available for purchase through the Kardia app for $9.99 per month or $99 per year, includes unlimited history and storage of your EKG recordings, a monthly EKG summary report to share with your doctor, along with the ability to track your blood pressure, activity and weight Includes one KardiaMobile phone clip FREE SHIPPING: Standard delivery 1-3 business days. Orders placed by 11:00am PST will ship that afternoon. Otherwise, will ship next business day. All orders ship via [employer] from [place], [place]

## 2023-06-27 ENCOUNTER — Encounter: Payer: Self-pay | Admitting: Cardiology

## 2023-06-27 ENCOUNTER — Encounter: Payer: Self-pay | Admitting: Pulmonary Disease

## 2023-06-27 ENCOUNTER — Telehealth: Payer: Self-pay

## 2023-06-27 DIAGNOSIS — R0781 Pleurodynia: Secondary | ICD-10-CM | POA: Diagnosis not present

## 2023-06-27 DIAGNOSIS — Z9889 Other specified postprocedural states: Secondary | ICD-10-CM | POA: Diagnosis not present

## 2023-06-27 DIAGNOSIS — M5136 Other intervertebral disc degeneration, lumbar region: Secondary | ICD-10-CM | POA: Diagnosis not present

## 2023-06-27 DIAGNOSIS — R269 Unspecified abnormalities of gait and mobility: Secondary | ICD-10-CM | POA: Diagnosis not present

## 2023-06-27 DIAGNOSIS — M7918 Myalgia, other site: Secondary | ICD-10-CM | POA: Diagnosis not present

## 2023-06-27 DIAGNOSIS — M47816 Spondylosis without myelopathy or radiculopathy, lumbar region: Secondary | ICD-10-CM | POA: Diagnosis not present

## 2023-06-27 NOTE — Telephone Encounter (Signed)
Patient wife called and wanted to know what stage was he in CHF with his PRO BNP being 1629. She has tried to call cardiologist and no one has called her back. Labs are in chart. Please Advise

## 2023-06-27 NOTE — Progress Notes (Signed)
Don [name]    [id number]    1951-10-27  Primary Care Physician:Cox, Fritzi Mandes, MD  Referring Physician: Blane Ohara, MD [address] [place],  [place] [address]  Chief complaint:   Patient being seen for an acute visit  HPI:  Patient of Dr. Marchelle Gearing with chronic obstructive pulmonary disease  Came in because of increasing cough, concern with aspiration  No fevers, no chills  Short of breath with activity  Remains an active smoker of about a pack a day  He has chronic back pain Chronically short of breath  Outpatient Encounter Medications as of 06/26/2023  Medication Sig   ALPRAZolam (XANAX) 0.25 MG tablet TAKE 1 TABLET BY MOUTH ONCE DAILY AS NEEDED FOR ANXIETY   arformoterol (BROVANA) 15 MCG/2ML NEBU Take 2 mLs (15 mcg total) by nebulization 2 (two) times daily.   budesonide (PULMICORT) 0.25 MG/2ML nebulizer solution Take 2 mLs (0.25 mg total) by nebulization in the morning and at bedtime.   clopidogrel (PLAVIX) 75 MG tablet Take 1 tablet by mouth once daily   cyanocobalamin (VITAMIN B12) 1000 MCG/ML injection INJECT 1 ML (CC) INTRAMUSCULARLY ONCE A WEEK   cyclobenzaprine (FLEXERIL) 10 MG tablet TAKE  TABLET BY MOUTH THREE TIMES DAILY AS NEEDED FOR MUSCLE SPASM   Dupilumab (DUPIXENT) 300 MG/2ML SOPN Inject 300 mg into the skin every 14 (fourteen) days.   fenofibrate 160 MG tablet Take 1 tablet by mouth once daily   finasteride (PROSCAR) 5 MG tablet Take 1 tablet by mouth once daily   gabapentin (NEURONTIN) 400 MG capsule TAKE 1 CAPSULE BY MOUTH THREE TIMES DAILY   insulin lispro (HUMALOG) 100 UNIT/ML injection Inject 0.04 mLs (4 Units total) into the skin 3 (three) times daily with meals.   ipratropium-albuterol (DUONEB) 0.5-2.5 (3) MG/3ML SOLN USE 1 AMPULE IN NEBULIZER 4 TIMES DAILY   Lancets (ONETOUCH DELICA PLUS LANCET33G) MISC USE 1  TO CHECK GLUCOSE THREE TIMES DAILY   levothyroxine (SYNTHROID) 25 MCG tablet Take 1 tablet by mouth once daily    losartan (COZAAR) 50 MG tablet Take 1 tablet (50 mg total) by mouth daily.   magnesium chloride (SLOW-MAG) 64 MG TBEC SR tablet Take 1 tablet (64 mg total) by mouth daily.   metFORMIN (GLUCOPHAGE) 1000 MG tablet TAKE 1 TABLET BY MOUTH TWICE DAILY WITH MEALS   MYRBETRIQ 25 MG TB24 tablet Take 1 tablet by mouth once daily   nitroGLYCERIN (NITROSTAT) 0.4 MG SL tablet DISSOLVE ONE TABLET UNDER THE TONGUE EVERY 5 TO 10 MINUTES PRIOR TO ACTIVITIES WHICH MIGHT PRECIPITATE AN ATTACK   nystatin cream (MYCOSTATIN) Apply 1 application topically 2 (two) times daily as needed for dry skin.   Omega-3 Fatty Acids (FISH OIL) 1000 MG CAPS Take 2 capsules (2,000 mg total) by mouth in the morning and at bedtime.   omeprazole (PRILOSEC) 20 MG capsule Take 1 capsule (20 mg total) by mouth daily.   ONETOUCH ULTRA TEST test strip USE 1 STRIP TO CHECK GLUCOSE IN THE MORNING AND 1 AT BEDTIME AS DIRECTED   oxyCODONE-acetaminophen (PERCOCET/ROXICET) 5-325 MG tablet Take 1 tablet by mouth every 6 (six) hours as needed for severe pain.   revefenacin (YUPELRI) 175 MCG/3ML nebulizer solution Take 3 mLs (175 mcg total) by nebulization daily.   rosuvastatin (CRESTOR) 40 MG tablet Take 1 tablet by mouth once daily   tamsulosin (FLOMAX) 0.4 MG CAPS capsule Take 2 capsules (0.8 mg total) by mouth daily after supper.  torsemide (DEMADEX) 20 MG tablet Take 1 tablet (20 mg total) by mouth daily.   TOUJEO SOLOSTAR 300 UNIT/ML Solostar Pen INJECT 30 UNITS SUBCUTANEOUSLY ONCE DAILY   traZODone (DESYREL) 150 MG tablet Take 1 tablet (150 mg total) by mouth daily.   VENTOLIN HFA 108 (90 Base) MCG/ACT inhaler INHALE 2 PUFFS BY MOUTH EVERY 6 HOURS AS NEEDED FOR SHORTNESS OF BREATH FOR WHEEZING   Vitamin D, Ergocalciferol, (DRISDOL) 1.25 MG (50000 UNIT) CAPS capsule TAKE 1 CAPSULE BY MOUTH TWICE A WEEK   desvenlafaxine (PRISTIQ) 50 MG 24 hr tablet Take 1 tablet (50 mg total) by mouth in the morning for 7 days, THEN 2 tablets (100 mg total)  in the morning for 23 days.   [DISCONTINUED] amiodarone (PACERONE) 200 MG tablet Take 2 tablets (400 mg total) TWICE a day for 2 weeks, take 1 tablet (200 mg total) TWICE a day for 2 weeks, then take 1 tablet (200 mg total) ONCE a day   [DISCONTINUED] furosemide (LASIX) 20 MG tablet Take 1 tablet (20 mg total) by mouth daily.   No facility-administered encounter medications on file as of 06/26/2023.    Allergies as of 06/26/2023 - Review Complete 06/26/2023  Allergen Reaction Noted   Penicillins Rash and Hives 01/08/2015   Bactrim [sulfamethoxazole-trimethoprim]  01/18/2023   Doxycycline Rash 05/05/2015   Iodinated contrast media Rash 01/18/2015   Penicillin g Rash 01/22/2003   Tramadol Rash 08/15/2006    Past Medical History:  Diagnosis Date   Absence of bladder continence 01/08/2022   Acute bilateral low back pain without sciatica 01/08/2022   Anxiety state 02/02/2022   Arthritis    Asymptomatic LV dysfunction 10/18/2017   B12 deficiency anemia 08/25/2021   Basal cell carcinoma    Benign prostatic hyperplasia with incomplete bladder emptying 01/14/2019   Blood in ear canal, left 05/13/2021   Bone pain 03/08/2020   Cancer (HCC)    throat - 1997, throat - 2018   Cardiomyopathy, secondary (HCC) 12/01/2016   Overview:  EF 47% 12/26/16   Chest pain syndrome 11/02/2017   Chronic coronary artery disease 10/18/2017   Chronic cough 05/16/2022   Chronic ischemic heart disease 11/13/1998   Jan 22, 2003 Entered By: Aris Lot J Comment:  massive Mi in 2000 per patient hsitory, 2nd Mi in 2001Mar 11, 2004 Entered By: Aris Lot J Comment: Had stent placedin 2000,cath/angio in 2001   Chronic neck pain 05/16/2022   Chronic respiratory failure with hypoxia (HCC) 05/16/2022   CKD (chronic kidney disease) stage 3, GFR 30-59 ml/min (HCC) 07/25/2019   COPD (chronic obstructive pulmonary disease) (HCC)    Coronary artery disease    Coronary artery disease involving native coronary artery of native  heart with angina pectoris (HCC) 12/01/2016   Overview:  He has hx of IWMI in remote past, last cath in 2012 at [facility] showed chronic total occlusion of previously stented RCA, with good collaterals, a 40% LAD stenosis, inferior hypokinesis and EF 45%.    He's been lost to Cardiology f/u since 2015, but has not had any recurrent events   Deficiency anemia 08/25/2021   Depression    Diabetes mellitus (HCC) 03/08/2020   Diabetic polyneuropathy associated with type 2 diabetes mellitus (HCC) 05/16/2022   Diaphoresis 05/16/2022   Diverticulitis 05/02/2020   Dyslipidemia 12/01/2016   Emphysema lung (HCC)    Erectile dysfunction due to diseases classified elsewhere 06/12/2019   Essential hypertension 10/18/2017   GERD (gastroesophageal reflux disease)    Hoarseness 11/15/2016   Hyperlipidemia  Hypertension    Insomnia 01/28/2021   Iron deficiency anemia due to chronic blood loss 08/25/2021   Laceration of thumb, left 12/17/2015   Left flank pain 03/14/2022   Left lower quadrant abdominal tenderness without rebound tenderness 03/14/2022   Lesion of vocal cord    Leukoplakia of larynx 11/15/2016   Long-term use of aspirin therapy 10/02/2018   Malfunction of penile prosthesis (HCC) 01/14/2019   Malignant neoplasm of skin 01/28/2021   Formatting of this note might be different from the original. Jan 22, 2003 Entered By: Aris Lot J Comment: of skin - removed x2 inpast Jan 22, 2003 Entered By: Aris Lot J Comment: of skin - removed x2 inpast   Melanoma of back (HCC)    melanoma on back   Memory loss 03/08/2020   Mood disorder in conditions classified elsewhere 02/02/2022   Myalgia 03/08/2020   Myocardial infarction [facility]) 2000   2 stents   Obstructive chronic bronchitis with exacerbation (HCC) 05/16/2022   Oropharyngeal dysphagia 08/12/2018   Other ill-defined and unknown causes of morbidity and mortality 11/13/1993   Formatting of this note might be different from the original. Jan 31, 2008 Entered  By: ARMOUR,ROSS B Comment: lumbar, 8/08 Jan 22, 2003 Entered By: Aris Lot J Comment: x45yrs in work place  quit 43yrs ago in 2000 Jan 22, 2003 Entered By: Aris Lot J Comment: x58yrs in work place  quit 33yrs ago in 2000 Jan 31, 2008 Entered By: Bennie Pierini B Comment: lumbar, 8/08   PAD (peripheral artery disease) (HCC) 10/18/2017   Peripheral vascular disease (HCC)    iliac artery clot   Peyronie's disease 06/12/2019   Pharyngoesophageal dysphagia 08/12/2018   Pneumonia 02/2015   Pneumothorax 06/07/2020   Preoperative clearance 04/03/2018   Squamous cell carcinoma of larynx (HCC) 12/13/2016   Testicular pain, left 01/14/2019   Throat cancer (HCC)    Tobacco use disorder 06/12/2019    Past Surgical History:  Procedure Laterality Date   ABDOMINAL ANGIOGRAM N/A 01/13/2015   Procedure: ABDOMINAL ANGIOGRAM;  Surgeon: Larina Earthly, MD;  Location: [facility] CATH LAB;  Service: Cardiovascular;  Laterality: N/A;   APPENDECTOMY     BACK SURGERY     CARDIAC CATHETERIZATION  2000/2012   with stents in 2000   CHOLECYSTECTOMY     COLONOSCOPY     ELBOW SURGERY     bilateral   ESOPHAGOGASTRODUODENOSCOPY     KNEE SURGERY Left    MICROLARYNGOSCOPY WITH CO2 LASER AND EXCISION OF VOCAL CORD LESION N/A 11/23/2016   Procedure: MICROLARYNGOSCOPY  AND EXCISION OF VOCAL CORD LESION;  Surgeon: Suzanna Obey, MD;  Location: [facility] OR;  Service: ENT;  Laterality: N/A;   MICROLARYNGOSCOPY WITH CO2 LASER AND EXCISION OF VOCAL CORD LESION N/A 01/11/2017   Procedure: MICROLARYNGOSCOPY WITH CO2 LASER AND EXCISION OF VOCAL CORD LESION;  Surgeon: Suzanna Obey, MD;  Location: [facility] OR;  Service: ENT;  Laterality: N/A;   MICROLARYNGOSCOPY WITH LASER N/A 03/16/2015   Procedure: MICROLARYNGOSCOPY ;  Surgeon: Suzanna Obey, MD;  Location: [facility] OR;  Service: ENT;  Laterality: N/A;   peyronie's surgery     SHOULDER SURGERY Right    rotator cuff   SPINE SURGERY  09/2020   cervical surgery. steel plate, bone spurs, allograft.   THROAT SURGERY   1997   cancer removed    Family History  Problem Relation Age of Onset   Cancer Mother        bone   Hypertension Mother    Stroke Father    Hypertension  Father    Healthy Child    Cancer Other        brain   Diabetes Other     Social History   Socioeconomic History   Marital status: Married    Spouse name: Not on file   Number of children: 4   Years of education: Not on file   Highest education level: GED or equivalent  Occupational History   Occupation: retired  Tobacco Use   Smoking status: Every Day    Current packs/day: 1.00    Average packs/day: 1 pack/day for 50.0 years (50.0 ttl pk-yrs)    Types: Cigarettes   Smokeless tobacco: Never   Tobacco comments:    down to .5 ppd  11/02/2022 hfb  Vaping Use   Vaping status: Former  Substance and Sexual Activity   Alcohol use: Not on file   Drug use: No   Sexual activity: Not on file  Other Topics Concern   Not on file  Social History Narrative   Lives with wife       One level      Right hand   Social Determinants of Health   Financial Resource Strain: Low Risk  (06/22/2023)   Received from [facility]   Overall Financial Resource Strain (CARDIA)    Difficulty of Paying Living Expenses: Not hard at all  Food Insecurity: No Food Insecurity (06/22/2023)   Received from [facility]   Hunger Vital Sign    Worried About Running Out of Food in the Last Year: Never true    Ran Out of Food in the Last Year: Never true  Transportation Needs: No Transportation Needs (06/22/2023)   Received from [facility] - Transportation    In the past 12 months, has lack of transportation kept you from medical appointments or from getting medications?: No    Lack of Transportation (Non-Medical): No  Physical Activity: Sufficiently Active (01/11/2023)   Exercise Vital Sign    Days of Exercise per Week: 5 days    Minutes of Exercise per Session: 30 min  Stress: No  Stress Concern Present (01/11/2023)   [employer] of Occupational Health - Occupational Stress Questionnaire    Feeling of Stress : Not at all  Social Connections: Moderately Isolated (01/11/2023)   Social Connection and Isolation Panel [NHANES]    Frequency of Communication with Friends and Family: Three times a week    Frequency of Social Gatherings with Friends and Family: Three times a week    Attends Religious Services: Never    Active Member of Clubs or Organizations: No    Attends Banker Meetings: Never    Marital Status: Married  Catering manager Violence: Not At Risk (01/11/2023)   Humiliation, Afraid, Rape, and Kick questionnaire    Fear of Current or Ex-Partner: No    Emotionally Abused: No    Physically Abused: No    Sexually Abused: No    Review of Systems  Constitutional:  Positive for fatigue.  Respiratory:  Positive for cough and shortness of breath.     Vitals:   06/26/23 1359  BP: 120/62  Pulse: (!) 53  SpO2: 94%     Physical Exam Constitutional:      Appearance: Normal appearance.  HENT:     Head: Normocephalic.     Mouth/Throat:     Mouth: Mucous membranes are moist.  Eyes:     General: No scleral icterus. Cardiovascular:  Rate and Rhythm: Normal rate and regular rhythm.     Heart sounds: No murmur heard.    No friction rub.  Pulmonary:     Effort: No respiratory distress.     Breath sounds: No stridor. Rhonchi present.  Musculoskeletal:     Cervical back: No rigidity or tenderness.  Neurological:     Mental Status: He is alert.  Psychiatric:        Mood and Affect: Mood normal.    Data Reviewed: Chest x-ray was ordered for today which was reviewed showing no acute infiltrate  Assessment:  Chronic obstructive pulmonary disease -Continue bronchodilator treatments  History of cardiomyopathy Chronic ischemic heart disease Coronary artery disease -Continue current medications  An active smoker, smoking cessation  counseling   Plan/Recommendations: Follow-up on chest x-ray  Encouraged to call with any significant worsening of symptoms -Antibiotics not indicated at present  Continue bronchodilator treatments  Graded activities as tolerated  Encouraged to keep current appointment scheduled with Dr. Raiford Simmonds MD [facility] 06/27/2023, 2:48 PM  CC: Blane Ohara, MD

## 2023-06-28 ENCOUNTER — Other Ambulatory Visit: Payer: Self-pay | Admitting: Family Medicine

## 2023-06-29 ENCOUNTER — Encounter: Payer: Self-pay | Admitting: Family Medicine

## 2023-07-03 ENCOUNTER — Encounter: Payer: Self-pay | Admitting: Cardiology

## 2023-07-03 ENCOUNTER — Ambulatory Visit: Payer: Medicare Other | Admitting: Cardiology

## 2023-07-03 ENCOUNTER — Ambulatory Visit: Payer: Medicare Other | Admitting: Physician Assistant

## 2023-07-03 VITALS — BP 138/50 | HR 80 | Ht 70.0 in | Wt 200.0 lb

## 2023-07-03 DIAGNOSIS — I519 Heart disease, unspecified: Secondary | ICD-10-CM

## 2023-07-03 DIAGNOSIS — I452 Bifascicular block: Secondary | ICD-10-CM

## 2023-07-03 DIAGNOSIS — I493 Ventricular premature depolarization: Secondary | ICD-10-CM | POA: Diagnosis not present

## 2023-07-03 DIAGNOSIS — R002 Palpitations: Secondary | ICD-10-CM | POA: Diagnosis not present

## 2023-07-03 NOTE — Progress Notes (Signed)
  Electrophysiology Office Note:   Date:  07/03/2023  ID:  Don [name], DOB [birth year], MRN [id number]  Primary Cardiologist: None Electrophysiologist: Shacoria Latif Jorja Loa, MD      History of Present Illness:   Don [name] is a 72 y.o. male with h/o PVCs seen today for routine electrophysiology followup.  Since last being seen in our clinic the patient reports increasing shortness of breath and fatigue.  He finds it difficult to do his daily activities and is quite tired and short of breath.  His daughter is in clinic with him today who states that she is concerned about an infection and he is to see his primary physician later today.  he denies chest pain, palpitations, dyspnea, PND, orthopnea, nausea, vomiting, dizziness, syncope, edema, weight gain, or early satiety.      He has a history of chronic systolic heart failure, coronary artery disease, PE, stage IV COPD, hypertension, PVD, GERD, type 2 diabetes, CKD stage III, hyperlipidemia, tobacco abuse, obstructive sleep apnea noncompliant with CPAP.  He has an elevated PVC burden at 17%.  He was started on mexiletine but had hallucinations and was switched to amiodarone.  Unfortunately did not tolerate amiodarone.      Review of systems complete and found to be negative unless listed in HPI.   EP Information / Studies Reviewed:    EKG is ordered today. Personal review as below.        Risk Assessment/Calculations:              Physical Exam:   VS:  BP (!) 138/50 (BP Location: Right Arm, Patient Position: Sitting, Cuff Size: Large)   Pulse 80   Ht 5\' 10"  (1.778 m)   Wt 200 lb (90.7 kg)   SpO2 (!) 84%   BMI 28.70 kg/m    Wt Readings from Last 3 Encounters:  07/03/23 200 lb (90.7 kg)  06/26/23 199 lb (90.3 kg)  06/26/23 199 lb 3.2 oz (90.4 kg)     GEN: Well nourished, well developed in no acute distress NECK: No JVD; No carotid bruits CARDIAC: Irregularly irregular rate and rhythm, no murmurs, rubs,  gallops RESPIRATORY:  Clear to auscultation without rales, wheezing or rhonchi  ABDOMEN: Soft, non-tender, non-distended EXTREMITIES:  No edema; No deformity   ASSESSMENT AND PLAN:    1.  PVCs: Elevated burden at 17.4%.  He has tried both amiodarone and mexiletine without improvement in his PVC burden and hallucinations with mexiletine.  I am quite concerned about his lung issues and therapy with his PVCs.  I am concerned about anesthesia if we were to move forward with ablation, and thus I do not think that he is a very good candidate due to his chronic lung issues.  I Dorenda Pfannenstiel discuss this further with his pulmonologist.  Additionally, he may benefit from a medication such as sotalol.  2.  Coronary artery disease: Has chronic stable angina.  Plan per primary cardiology.  3.  Chronic systolic heart failure: Due to a combination of prior MI with ischemic cardiomyopathy and potentially PVCs.  On optimal medical therapy per general cardiology.  4.  Hypertension: Well-controlled  5.  Hyperlipidemia: Continue statin per primary cardiology  6.  Obstructive sleep apnea: CPAP compliance encouraged  7.  COPD: O2 sats are low in clinic today.  He has follow-up with his primary physician.  We started oxygen today.  Follow up with Dr. Elberta Fortis  pending discussions with pulmonology   Signed, Kiren Mcisaac Jorja Loa, MD

## 2023-07-04 ENCOUNTER — Other Ambulatory Visit: Payer: Self-pay

## 2023-07-04 ENCOUNTER — Other Ambulatory Visit: Payer: Self-pay | Admitting: Family Medicine

## 2023-07-04 DIAGNOSIS — F331 Major depressive disorder, recurrent, moderate: Secondary | ICD-10-CM

## 2023-07-06 ENCOUNTER — Ambulatory Visit (INDEPENDENT_AMBULATORY_CARE_PROVIDER_SITE_OTHER): Payer: Medicare Other | Admitting: Family Medicine

## 2023-07-06 ENCOUNTER — Encounter: Payer: Self-pay | Admitting: Family Medicine

## 2023-07-06 VITALS — BP 124/56 | HR 44 | Temp 97.2°F | Ht 70.0 in | Wt 197.0 lb

## 2023-07-06 DIAGNOSIS — R051 Acute cough: Secondary | ICD-10-CM

## 2023-07-06 DIAGNOSIS — E1142 Type 2 diabetes mellitus with diabetic polyneuropathy: Secondary | ICD-10-CM

## 2023-07-06 DIAGNOSIS — D518 Other vitamin B12 deficiency anemias: Secondary | ICD-10-CM

## 2023-07-06 DIAGNOSIS — I110 Hypertensive heart disease with heart failure: Secondary | ICD-10-CM | POA: Diagnosis not present

## 2023-07-06 DIAGNOSIS — E611 Iron deficiency: Secondary | ICD-10-CM | POA: Diagnosis not present

## 2023-07-06 DIAGNOSIS — J9611 Chronic respiratory failure with hypoxia: Secondary | ICD-10-CM

## 2023-07-06 DIAGNOSIS — I700 Atherosclerosis of aorta: Secondary | ICD-10-CM

## 2023-07-06 DIAGNOSIS — R0789 Other chest pain: Secondary | ICD-10-CM

## 2023-07-06 LAB — POC COVID19 BINAXNOW: SARS Coronavirus 2 Ag: NEGATIVE

## 2023-07-06 NOTE — Progress Notes (Unsigned)
Acute Office Visit  Subjective:    Patient ID: Don [name], male    DOB: 11-21-1950, 72 y.o.   MRN: [id number]  Chief Complaint  Patient presents with   Cough   COPD    HPI: Patient is in today for coughing and shob. States productive cough started 3-4 weeks ago and shob worsened around the same time. Hx of COPD. At times sputum is colorless or green. Using rescue inhaler and nebulizer machine.  Past Medical History:  Diagnosis Date   Absence of bladder continence 01/08/2022   Acute bilateral low back pain without sciatica 01/08/2022   Anxiety state 02/02/2022   Arthritis    Asymptomatic LV dysfunction 10/18/2017   B12 deficiency anemia 08/25/2021   Basal cell carcinoma    Benign prostatic hyperplasia with incomplete bladder emptying 01/14/2019   Blood in ear canal, left 05/13/2021   Bone pain 03/08/2020   Cancer (HCC)    throat - 1997, throat - 2018   Cardiomyopathy, secondary (HCC) 12/01/2016   Overview:  EF 47% 12/26/16   Chest pain syndrome 11/02/2017   Chronic coronary artery disease 10/18/2017   Chronic cough 05/16/2022   Chronic ischemic heart disease 11/13/1998   Jan 22, 2003 Entered By: Aris Lot J Comment:  massive Mi in 2000 per patient hsitory, 2nd Mi in 2001Mar 11, 2004 Entered By: Aris Lot J Comment: Had stent placedin 2000,cath/angio in 2001   Chronic neck pain 05/16/2022   Chronic respiratory failure with hypoxia (HCC) 05/16/2022   CKD (chronic kidney disease) stage 3, GFR 30-59 ml/min (HCC) 07/25/2019   COPD (chronic obstructive pulmonary disease) (HCC)    Coronary artery disease    Coronary artery disease involving native coronary artery of native heart with angina pectoris (HCC) 12/01/2016   Overview:  He has hx of IWMI in remote past, last cath in 2012 at [facility] showed chronic total occlusion of previously stented RCA, with good collaterals, a 40% LAD stenosis, inferior hypokinesis and EF 45%.    He's been lost to Cardiology f/u since 2015, but has not  had any recurrent events   Deficiency anemia 08/25/2021   Depression    Diabetes mellitus (HCC) 03/08/2020   Diabetic polyneuropathy associated with type 2 diabetes mellitus (HCC) 05/16/2022   Diaphoresis 05/16/2022   Diverticulitis 05/02/2020   Dyslipidemia 12/01/2016   Emphysema lung (HCC)    Erectile dysfunction due to diseases classified elsewhere 06/12/2019   Essential hypertension 10/18/2017   GERD (gastroesophageal reflux disease)    Hoarseness 11/15/2016   Hyperlipidemia    Hypertension    Insomnia 01/28/2021   Iron deficiency anemia due to chronic blood loss 08/25/2021   Laceration of thumb, left 12/17/2015   Left flank pain 03/14/2022   Left lower quadrant abdominal tenderness without rebound tenderness 03/14/2022   Lesion of vocal cord    Leukoplakia of larynx 11/15/2016   Long-term use of aspirin therapy 10/02/2018   Malfunction of penile prosthesis (HCC) 01/14/2019   Malignant neoplasm of skin 01/28/2021   Formatting of this note might be different from the original. Jan 22, 2003 Entered By: Aris Lot J Comment: of skin - removed x2 inpast Jan 22, 2003 Entered By: Aris Lot J Comment: of skin - removed x2 inpast   Melanoma of back (HCC)    melanoma on back   Memory loss 03/08/2020   Mood disorder in conditions classified elsewhere 02/02/2022   Myalgia 03/08/2020   Myocardial infarction [place]) 2000   2 stents   Obstructive chronic bronchitis with exacerbation (HCC)  05/16/2022   Oropharyngeal dysphagia 08/12/2018   Other ill-defined and unknown causes of morbidity and mortality 11/13/1993   Formatting of this note might be different from the original. Jan 31, 2008 Entered By: ARMOUR,ROSS B Comment: lumbar, 8/08 Jan 22, 2003 Entered By: Aris Lot J Comment: x36yrs in work place  quit 41yrs ago in 2000 Jan 22, 2003 Entered By: Aris Lot J Comment: x28yrs in work place  quit 51yrs ago in 2000 Jan 31, 2008 Entered By: Bennie Pierini B Comment: lumbar, 8/08   PAD (peripheral  artery disease) (HCC) 10/18/2017   Peripheral vascular disease (HCC)    iliac artery clot   Peyronie's disease 06/12/2019   Pharyngoesophageal dysphagia 08/12/2018   Pneumonia 02/2015   Pneumothorax 06/07/2020   Preoperative clearance 04/03/2018   Squamous cell carcinoma of larynx (HCC) 12/13/2016   Testicular pain, left 01/14/2019   Throat cancer (HCC)    Tobacco use disorder 06/12/2019    Past Surgical History:  Procedure Laterality Date   ABDOMINAL ANGIOGRAM N/A 01/13/2015   Procedure: ABDOMINAL ANGIOGRAM;  Surgeon: Larina Earthly, MD;  Location: [facility] CATH LAB;  Service: Cardiovascular;  Laterality: N/A;   APPENDECTOMY     BACK SURGERY     CARDIAC CATHETERIZATION  2000/2012   with stents in 2000   CHOLECYSTECTOMY     COLONOSCOPY     ELBOW SURGERY     bilateral   ESOPHAGOGASTRODUODENOSCOPY     KNEE SURGERY Left    MICROLARYNGOSCOPY WITH CO2 LASER AND EXCISION OF VOCAL CORD LESION N/A 11/23/2016   Procedure: MICROLARYNGOSCOPY  AND EXCISION OF VOCAL CORD LESION;  Surgeon: Suzanna Obey, MD;  Location: [facility] OR;  Service: ENT;  Laterality: N/A;   MICROLARYNGOSCOPY WITH CO2 LASER AND EXCISION OF VOCAL CORD LESION N/A 01/11/2017   Procedure: MICROLARYNGOSCOPY WITH CO2 LASER AND EXCISION OF VOCAL CORD LESION;  Surgeon: Suzanna Obey, MD;  Location: [facility] OR;  Service: ENT;  Laterality: N/A;   MICROLARYNGOSCOPY WITH LASER N/A 03/16/2015   Procedure: MICROLARYNGOSCOPY ;  Surgeon: Suzanna Obey, MD;  Location: [facility] OR;  Service: ENT;  Laterality: N/A;   peyronie's surgery     SHOULDER SURGERY Right    rotator cuff   SPINE SURGERY  09/2020   cervical surgery. steel plate, bone spurs, allograft.   THROAT SURGERY  1997   cancer removed    Family History  Problem Relation Age of Onset   Cancer Mother        bone   Hypertension Mother    Stroke Father    Hypertension Father    Healthy Child    Cancer Other        brain   Diabetes Other     Social History   Socioeconomic History   Marital status:  Married    Spouse name: Not on file   Number of children: 4   Years of education: Not on file   Highest education level: GED or equivalent  Occupational History   Occupation: retired  Tobacco Use   Smoking status: Every Day    Current packs/day: 1.00    Average packs/day: 1 pack/day for 50.0 years (50.0 ttl pk-yrs)    Types: Cigarettes   Smokeless tobacco: Never   Tobacco comments:    down to .5 ppd  11/02/2022 hfb  Vaping Use   Vaping status: Former  Substance and Sexual Activity   Alcohol use: Not on file   Drug use: No   Sexual activity: Not on file  Other Topics Concern  Not on file  Social History Narrative   Lives with wife       One level      Right hand   Social Determinants of Health   Financial Resource Strain: Low Risk  (06/22/2023)   Received from [facility]   Overall Financial Resource Strain (CARDIA)    Difficulty of Paying Living Expenses: Not hard at all  Food Insecurity: No Food Insecurity (06/22/2023)   Received [facility]   Hunger Vital Sign    Worried About Running Out of Food in the Last Year: Never true    Ran Out of Food in the Last Year: Never true  Transportation Needs: No Transportation Needs (06/22/2023)   Received from [facility] - Transportation    In the past 12 months, has lack of transportation kept you from medical appointments or from getting medications?: No    Lack of Transportation (Non-Medical): No  Physical Activity: Sufficiently Active (01/11/2023)   Exercise Vital Sign    Days of Exercise per Week: 5 days    Minutes of Exercise per Session: 30 min  Stress: No Stress Concern Present (01/11/2023)   [employer] of Occupational Health - Occupational Stress Questionnaire    Feeling of Stress : Not at all  Social Connections: Moderately Isolated (01/11/2023)   Social Connection and Isolation Panel [NHANES]    Frequency of Communication with Friends and  Family: Three times a week    Frequency of Social Gatherings with Friends and Family: Three times a week    Attends Religious Services: Never    Active Member of Clubs or Organizations: No    Attends Banker Meetings: Never    Marital Status: Married  Catering manager Violence: Not At Risk (01/11/2023)   Humiliation, Afraid, Rape, and Kick questionnaire    Fear of Current or Ex-Partner: No    Emotionally Abused: No    Physically Abused: No    Sexually Abused: No    Outpatient Medications Prior to Visit  Medication Sig Dispense Refill   ALPRAZolam (XANAX) 0.25 MG tablet TAKE 1 TABLET BY MOUTH ONCE DAILY AS NEEDED FOR ANXIETY 30 tablet 2   arformoterol (BROVANA) 15 MCG/2ML NEBU Take 2 mLs (15 mcg total) by nebulization 2 (two) times daily. 120 mL 6   budesonide (PULMICORT) 0.25 MG/2ML nebulizer solution Take 2 mLs (0.25 mg total) by nebulization in the morning and at bedtime. 60 mL 11   clopidogrel (PLAVIX) 75 MG tablet Take 1 tablet by mouth once daily 90 tablet 0   cyanocobalamin (VITAMIN B12) 1000 MCG/ML injection INJECT 1 ML (CC) INTRAMUSCULARLY ONCE A WEEK 10 mL 0   cyclobenzaprine (FLEXERIL) 10 MG tablet TAKE  TABLET BY MOUTH THREE TIMES DAILY AS NEEDED FOR MUSCLE SPASM 270 tablet 0   desvenlafaxine (PRISTIQ) 50 MG 24 hr tablet TAKE 1 TABLET BY MOUTH IN THE MORNING FOR 7 DAYS, THEN 2 TABLETS IN THE MORNING FOR 23 DAYS 49 tablet 0   Dupilumab (DUPIXENT) 300 MG/2ML SOPN Inject 300 mg into the skin every 14 (fourteen) days. 12 mL 1   fenofibrate 160 MG tablet Take 1 tablet by mouth once daily 90 tablet 0   finasteride (PROSCAR) 5 MG tablet Take 1 tablet by mouth once daily 90 tablet 0   gabapentin (NEURONTIN) 400 MG capsule TAKE 1 CAPSULE BY MOUTH THREE TIMES DAILY 270 capsule 0   insulin lispro (HUMALOG) 100 UNIT/ML injection Inject 0.04 mLs (4  Units total) into the skin 3 (three) times daily with meals. 10 mL 11   ipratropium-albuterol (DUONEB) 0.5-2.5 (3) MG/3ML SOLN USE 1  AMPULE IN NEBULIZER 4 TIMES DAILY 360 mL 1   Lancets (ONETOUCH DELICA PLUS LANCET33G) MISC USE 1  TO CHECK GLUCOSE THREE TIMES DAILY 100 each 0   levothyroxine (SYNTHROID) 25 MCG tablet Take 1 tablet by mouth once daily 90 tablet 0   losartan (COZAAR) 50 MG tablet Take 1 tablet (50 mg total) by mouth daily. 90 tablet 3   magnesium chloride (SLOW-MAG) 64 MG TBEC SR tablet Take 1 tablet (64 mg total) by mouth daily. 90 tablet 3   metFORMIN (GLUCOPHAGE) 1000 MG tablet TAKE 1 TABLET BY MOUTH TWICE DAILY WITH MEALS 180 tablet 0   MYRBETRIQ 25 MG TB24 tablet Take 1 tablet by mouth once daily 30 tablet 0   nitroGLYCERIN (NITROSTAT) 0.4 MG SL tablet DISSOLVE ONE TABLET UNDER THE TONGUE EVERY 5 TO 10 MINUTES PRIOR TO ACTIVITIES WHICH MIGHT PRECIPITATE AN ATTACK 25 tablet 0   nystatin cream (MYCOSTATIN) Apply 1 application topically 2 (two) times daily as needed for dry skin.     Omega-3 Fatty Acids (FISH OIL) 1000 MG CAPS Take 2 capsules (2,000 mg total) by mouth in the morning and at bedtime. 180 capsule 12   omeprazole (PRILOSEC) 20 MG capsule Take 1 capsule (20 mg total) by mouth daily. 90 capsule 0   ONETOUCH ULTRA TEST test strip USE 1 STRIP TO CHECK GLUCOSE IN THE MORNING AND 1 AT BEDTIME AS DIRECTED 100 each 0   oxyCODONE-acetaminophen (PERCOCET/ROXICET) 5-325 MG tablet Take 1 tablet by mouth every 6 (six) hours as needed for severe pain. 120 tablet 0   revefenacin (YUPELRI) 175 MCG/3ML nebulizer solution Take 3 mLs (175 mcg total) by nebulization daily. 90 mL 11   rosuvastatin (CRESTOR) 40 MG tablet Take 1 tablet by mouth once daily 90 tablet 0   tamsulosin (FLOMAX) 0.4 MG CAPS capsule Take 2 capsules (0.8 mg total) by mouth daily after supper. 180 capsule 1   torsemide (DEMADEX) 20 MG tablet Take 1 tablet (20 mg total) by mouth daily. 90 tablet 3   TOUJEO SOLOSTAR 300 UNIT/ML Solostar Pen INJECT 30 UNITS SUBCUTANEOUSLY ONCE DAILY 6 mL 0   traZODone (DESYREL) 150 MG tablet Take 1 tablet (150 mg  total) by mouth daily. 270 tablet 0   VENTOLIN HFA 108 (90 Base) MCG/ACT inhaler INHALE 2 PUFFS BY MOUTH EVERY 6 HOURS AS NEEDED FOR SHORTNESS OF BREATH FOR WHEEZING 18 g 0   Vitamin D, Ergocalciferol, (DRISDOL) 1.25 MG (50000 UNIT) CAPS capsule TAKE 1 CAPSULE BY MOUTH TWICE A WEEK 12 capsule 3   No facility-administered medications prior to visit.    Allergies  Allergen Reactions   Penicillins Rash and Hives    Has patient had a PCN reaction causing immediate rash, facial/tongue/throat swelling, SOB or lightheadedness with hypotension:unsure Has patient had a PCN reaction causing severe rash involving mucus membranes or skin necrosis:unsure Has patient had a PCN reaction that required hospitalization:No Has patient had a PCN reaction occurring within the last 10 years:No If all of the above answers are "NO", then may proceed with Cephalosporin use.  rash   Bactrim [Sulfamethoxazole-Trimethoprim]     Dizziness, confusion, involuntary movements   Doxycycline Rash   Iodinated Contrast Media Rash    Also developed blisters   Penicillin G Rash   Tramadol Rash    Review of Systems  Constitutional:  Negative for chills,  fatigue and fever.  HENT:  Negative for congestion, ear pain, postnasal drip, rhinorrhea, sinus pressure, sinus pain and sore throat.   Respiratory:  Positive for cough and shortness of breath.   Cardiovascular:  Negative for chest pain.  Gastrointestinal:  Negative for diarrhea, nausea and vomiting.  Neurological:  Negative for dizziness and headaches.       Objective:        07/06/2023   10:55 AM 07/03/2023   10:04 AM 06/26/2023    1:59 PM  Vitals with BMI  Height 5\' 10"  5\' 10"  5\' 10"   Weight 197 lbs 200 lbs 199 lbs  BMI 28.27 28.7 28.55  Systolic 124 138 469  Diastolic 56 50 62  Pulse 44 80 53    No data found.   Physical Exam Vitals reviewed.  Constitutional:      Appearance: Normal appearance.  HENT:     Right Ear: Tympanic membrane, ear canal  and external ear normal.     Left Ear: Tympanic membrane, ear canal and external ear normal.     Nose: Nose normal. No congestion or rhinorrhea.     Mouth/Throat:     Mouth: Mucous membranes are moist.     Pharynx: No oropharyngeal exudate or posterior oropharyngeal erythema.  Neck:     Vascular: No carotid bruit.  Cardiovascular:     Rate and Rhythm: Normal rate and regular rhythm.     Heart sounds: Normal heart sounds.  Pulmonary:     Effort: Pulmonary effort is normal. No respiratory distress.     Breath sounds: Normal breath sounds. No wheezing, rhonchi or rales.  Abdominal:     General: Bowel sounds are normal.     Palpations: Abdomen is soft.     Tenderness: There is no abdominal tenderness.  Neurological:     Mental Status: He is alert.  Psychiatric:        Mood and Affect: Mood normal.        Behavior: Behavior normal.     Health Maintenance Due  Topic Date Due   OPHTHALMOLOGY EXAM  Never done   Hepatitis C Screening  Never done   Zoster Vaccines- Shingrix (1 of 2) Never done   COVID-19 Vaccine (3 - Mixed Product risk series) 10/20/2022   INFLUENZA VACCINE  06/14/2023    There are no preventive care reminders to display for this patient.   Lab Results  Component Value Date   TSH 2.200 05/31/2023   Lab Results  Component Value Date   WBC 18.4 (H) 06/26/2023   HGB 12.2 (L) 06/26/2023   HCT 39.3 06/26/2023   MCV 81 06/26/2023   PLT 236 06/26/2023   Lab Results  Component Value Date   NA 137 06/26/2023   K 4.4 06/26/2023   CO2 24 06/26/2023   GLUCOSE 349 (H) 06/26/2023   BUN 18 06/26/2023   CREATININE 1.24 06/26/2023   BILITOT 0.3 01/18/2023   ALKPHOS 93 01/18/2023   AST 15 01/18/2023   ALT 12 01/18/2023   PROT 6.8 01/18/2023   ALBUMIN 4.1 01/18/2023   CALCIUM 8.8 06/26/2023   ANIONGAP 7 11/10/2022   EGFR 61 06/26/2023   GFR 74.13 08/17/2022   Lab Results  Component Value Date   CHOL 106 01/18/2023   Lab Results  Component Value Date    HDL 25 (L) 01/18/2023   Lab Results  Component Value Date   LDLCALC 40 01/18/2023   Lab Results  Component Value Date   TRIG 267 (H)  01/18/2023   Lab Results  Component Value Date   CHOLHDL 4.2 01/18/2023   Lab Results  Component Value Date   HGBA1C 8.5 (H) 01/18/2023       Assessment & Plan:  Hypertensive heart disease with heart failure (HCC) Assessment & Plan: Well controlled.  No changes to medicines.  Continue to work on eating a healthy diet and exercise.  Labs drawn today.    Orders: -     Comprehensive metabolic panel -     Pro b natriuretic peptide (BNP) -     CBC with Differential/Platelet  Acute cough Assessment & Plan: Order rapid covid test.  Orders: -     POC COVID-19 BinaxNow  Atherosclerosis of aorta (HCC) Assessment & Plan: Continue rosuvastatin 40 mg before bed.  Check labs  Orders: -     Lipid panel  Chronic respiratory failure with hypoxia (HCC) Assessment & Plan: Continue oxygen 2 L daily.   Other vitamin B12 deficiency anemia Assessment & Plan: Check labs  Orders: -     Vitamin B12 -     Methylmalonic acid, serum  Diabetic polyneuropathy associated with type 2 diabetes mellitus (HCC) Assessment & Plan: Control: Fair Recommend check sugars fasting daily. Recommend check feet daily. Recommend annual eye exams. Medicines: metformin 1000 mg daily and toujeo 30 mg daily, humalog 4 units TID with meals Continue to work on eating a healthy diet and exercise.  Labs drawn today.     Orders: -     Hemoglobin A1c  Iron deficiency Assessment & Plan: Check labs  Orders: -     Iron, TIBC and Ferritin Panel  Atypical chest pain Assessment & Plan: Order ribs xray.  Orders: -     DG Ribs Unilateral Right; Future     No orders of the defined types were placed in this encounter.   Orders Placed This Encounter  Procedures   DG Ribs Unilateral Right   Comprehensive metabolic panel   Pro b natriuretic peptide (BNP)    CBC with Differential/Platelet   Hemoglobin A1c   Lipid panel   Vitamin B12   Methylmalonic acid, serum   Iron, TIBC and Ferritin Panel   POC COVID-19     Follow-up: Return in about 3 months (around 10/06/2023) for chronic fasting.  An After Visit Summary was printed and given to the patient.   Clayborn Bigness I Leal-Borjas,acting as a scribe for Blane Ohara, MD.,have documented all relevant documentation on the behalf of Blane Ohara, MD,as directed by  Blane Ohara, MD while in the presence of Blane Ohara, MD.   Blane Ohara, MD [facility] [phone]

## 2023-07-07 DIAGNOSIS — R051 Acute cough: Secondary | ICD-10-CM

## 2023-07-07 DIAGNOSIS — E611 Iron deficiency: Secondary | ICD-10-CM | POA: Insufficient documentation

## 2023-07-07 HISTORY — DX: Iron deficiency: E61.1

## 2023-07-07 HISTORY — DX: Acute cough: R05.1

## 2023-07-07 LAB — IRON,TIBC AND FERRITIN PANEL
Ferritin: 16 ng/mL — ABNORMAL LOW (ref 30–400)
Iron Saturation: 14 % — ABNORMAL LOW (ref 15–55)
Iron: 54 ug/dL (ref 38–169)
Total Iron Binding Capacity: 382 ug/dL (ref 250–450)
UIBC: 328 ug/dL (ref 111–343)

## 2023-07-07 NOTE — Assessment & Plan Note (Signed)
 Order rapid covid test

## 2023-07-07 NOTE — Assessment & Plan Note (Signed)
Continue rosuvastatin 40 mg before bed.  Check labs

## 2023-07-07 NOTE — Assessment & Plan Note (Signed)
Continue oxygen 2 L daily.

## 2023-07-07 NOTE — Assessment & Plan Note (Signed)
Check labs 

## 2023-07-07 NOTE — Assessment & Plan Note (Signed)
Well controlled.  ?No changes to medicines.  ?Continue to work on eating a healthy diet and exercise.  ?Labs drawn today.  ?

## 2023-07-07 NOTE — Assessment & Plan Note (Signed)
Control: Fair Recommend check sugars fasting daily. Recommend check feet daily. Recommend annual eye exams. Medicines: metformin 1000 mg daily and toujeo 30 mg daily, humalog 4 units TID with meals Continue to work on eating a healthy diet and exercise.  Labs drawn today.

## 2023-07-07 NOTE — Assessment & Plan Note (Signed)
Order ribs xray.

## 2023-07-08 ENCOUNTER — Encounter: Payer: Self-pay | Admitting: Cardiology

## 2023-07-09 ENCOUNTER — Other Ambulatory Visit: Payer: Self-pay | Admitting: Family Medicine

## 2023-07-09 ENCOUNTER — Other Ambulatory Visit: Payer: Self-pay | Admitting: Physician Assistant

## 2023-07-09 DIAGNOSIS — K2100 Gastro-esophageal reflux disease with esophagitis, without bleeding: Secondary | ICD-10-CM

## 2023-07-09 NOTE — Telephone Encounter (Signed)
Spoke to wife.   Aware that Dr. Elberta Fortis recommends starting Sotalol --  I will send information on Sotalol via mychart so they may review.  She appreciates my call

## 2023-07-10 LAB — CBC WITH DIFFERENTIAL/PLATELET
Basophils Absolute: 0.1 10*3/uL (ref 0.0–0.2)
Basos: 1 %
EOS (ABSOLUTE): 0.2 10*3/uL (ref 0.0–0.4)
Eos: 2 %
Hematocrit: 38.7 % (ref 37.5–51.0)
Hemoglobin: 11.9 g/dL — ABNORMAL LOW (ref 13.0–17.7)
Immature Grans (Abs): 0 10*3/uL (ref 0.0–0.1)
Immature Granulocytes: 0 %
Lymphocytes Absolute: 3.6 10*3/uL — ABNORMAL HIGH (ref 0.7–3.1)
Lymphs: 31 %
MCH: 24.5 pg — ABNORMAL LOW (ref 26.6–33.0)
MCHC: 30.7 g/dL — ABNORMAL LOW (ref 31.5–35.7)
MCV: 80 fL (ref 79–97)
Monocytes Absolute: 0.9 10*3/uL (ref 0.1–0.9)
Monocytes: 8 %
Neutrophils Absolute: 6.7 10*3/uL (ref 1.4–7.0)
Neutrophils: 58 %
Platelets: 214 10*3/uL (ref 150–450)
RBC: 4.86 x10E6/uL (ref 4.14–5.80)
RDW: 16.2 % — ABNORMAL HIGH (ref 11.6–15.4)
WBC: 11.5 10*3/uL — ABNORMAL HIGH (ref 3.4–10.8)

## 2023-07-10 LAB — COMPREHENSIVE METABOLIC PANEL
ALT: 13 IU/L (ref 0–44)
AST: 11 IU/L (ref 0–40)
Albumin: 4.1 g/dL (ref 3.8–4.8)
Alkaline Phosphatase: 64 IU/L (ref 44–121)
BUN/Creatinine Ratio: 14 (ref 10–24)
BUN: 16 mg/dL (ref 8–27)
Bilirubin Total: 0.3 mg/dL (ref 0.0–1.2)
CO2: 26 mmol/L (ref 20–29)
Calcium: 9 mg/dL (ref 8.6–10.2)
Chloride: 104 mmol/L (ref 96–106)
Creatinine, Ser: 1.12 mg/dL (ref 0.76–1.27)
Globulin, Total: 2.4 g/dL (ref 1.5–4.5)
Glucose: 155 mg/dL — ABNORMAL HIGH (ref 70–99)
Potassium: 4.3 mmol/L (ref 3.5–5.2)
Sodium: 144 mmol/L (ref 134–144)
Total Protein: 6.5 g/dL (ref 6.0–8.5)
eGFR: 69 mL/min/{1.73_m2} (ref >59)

## 2023-07-10 LAB — LIPID PANEL
Chol/HDL Ratio: 4.4 ratio (ref 0.0–5.0)
Cholesterol, Total: 119 mg/dL (ref 100–199)
HDL: 27 mg/dL — ABNORMAL LOW (ref >39)
LDL Chol Calc (NIH): 46 mg/dL (ref 0–99)
Triglycerides: 297 mg/dL — ABNORMAL HIGH (ref 0–149)
VLDL Cholesterol Cal: 46 mg/dL — ABNORMAL HIGH (ref 5–40)

## 2023-07-10 LAB — VITAMIN B12: Vitamin B-12: 349 pg/mL (ref 232–1245)

## 2023-07-10 LAB — HEMOGLOBIN A1C
Est. average glucose Bld gHb Est-mCnc: 186 mg/dL
Hgb A1c MFr Bld: 8.1 % — ABNORMAL HIGH (ref 4.8–5.6)

## 2023-07-10 LAB — METHYLMALONIC ACID, SERUM: Methylmalonic Acid: 409 nmol/L — ABNORMAL HIGH (ref 0–378)

## 2023-07-10 LAB — PRO B NATRIURETIC PEPTIDE: NT-Pro BNP: 665 pg/mL — ABNORMAL HIGH (ref 0–376)

## 2023-07-10 MED ORDER — OXYCODONE-ACETAMINOPHEN 5-325 MG PO TABS: 1.0000 | ORAL_TABLET | Freq: Four times a day (QID) | ORAL | 0 refills | Status: DC | PRN

## 2023-07-11 ENCOUNTER — Other Ambulatory Visit: Payer: Self-pay | Admitting: Family Medicine

## 2023-07-12 ENCOUNTER — Telehealth: Payer: Self-pay | Admitting: Internal Medicine

## 2023-07-12 DIAGNOSIS — J329 Chronic sinusitis, unspecified: Secondary | ICD-10-CM

## 2023-07-12 DIAGNOSIS — R053 Chronic cough: Secondary | ICD-10-CM

## 2023-07-12 DIAGNOSIS — J441 Chronic obstructive pulmonary disease with (acute) exacerbation: Secondary | ICD-10-CM

## 2023-07-12 MED ORDER — LEVOFLOXACIN 500 MG PO TABS
500.0000 mg | ORAL_TABLET | Freq: Every day | ORAL | 0 refills | Status: DC
Start: 2023-07-12 — End: 2023-07-30

## 2023-07-12 MED ORDER — PREDNISONE 10 MG PO TABS
ORAL_TABLET | ORAL | 0 refills | Status: AC
Start: 2023-07-12 — End: 2023-07-20

## 2023-07-12 NOTE — Telephone Encounter (Signed)
Spoke with patient's wife [name] regarding Dr.Ramaswamy sent in medication to pharmacy of choice.   Take prednisone 40 mg daily x 2 days, then 20mg  daily x 2 days, then 10mg  daily x 2 days, then 5mg  daily x 2 days and stop     Re levaquin  - first caustion him -a) his QT interval on EKG aug 2024 was > (normaly July 2024). Levauqin can increase this and he can have cardiac death. Low odds but real risk. ALso, can cause tendonitis. If he still want this  -  take levaquin 500mg  once daily  X 5 days - monitor for tendonitis   - if not - do Z PAK  Patient was ok with Levaquin after letting [name] know about his QT interval on his EKG.  [name]'s voice was understanding. Nothing else further needed.

## 2023-07-12 NOTE — Telephone Encounter (Signed)
Take prednisone 40 mg daily x 2 days, then 20mg  daily x 2 days, then 10mg  daily x 2 days, then 5mg  daily x 2 days and stop   Re levaquin  - first caustion him -a) his QT interval on EKG aug 2024 was > (normaly July 2024). Levauqin can increase this and he can have cardiac death. Low odds but real risk. ALso, can cause tendonitis. If he still want this  -  take levaquin 500mg  once daily  X 5 days - monitor for tendonitis  - if not - do Z PAK   Allergies  Allergen Reactions   Penicillins Rash and Hives    Has patient had a PCN reaction causing immediate rash, facial/tongue/throat swelling, SOB or lightheadedness with hypotension:unsure Has patient had a PCN reaction causing severe rash involving mucus membranes or skin necrosis:unsure Has patient had a PCN reaction that required hospitalization:No Has patient had a PCN reaction occurring within the last 10 years:No If all of the above answers are "NO", then may proceed with Cephalosporin use.  rash   Bactrim [Sulfamethoxazole-Trimethoprim]     Dizziness, confusion, involuntary movements   Doxycycline Rash   Iodinated Contrast Media Rash    Also developed blisters   Penicillin G Rash   Tramadol Rash

## 2023-07-12 NOTE — Telephone Encounter (Signed)
Spoke with patient's wife [name] regarding prior message.   Patient is having a COPD flair for about two days. He is wheezing. Negative for pneumonia as he saw PCP on Monday.O2 statting at 82-87 without 02 (97 with 02). Would like levoquin and steroid called to [facility] in [place]. LOV 06/26/23. Unable to do acute visit.    [name] stated patient has been having white phlegm for the past 3-4 days . 0 fever some wheezing.   [name] the provider will be in the office at 1 and we will contact [name] after we receive a message back from the provider.  Patient is unable to come in or do a video visit today.   Dr.Ramaswamy can you please advise . Patient is ok to wait until later .  Thank you

## 2023-07-12 NOTE — Telephone Encounter (Signed)
Patient is having a COPD flair for about two days. He is wheezing. Negative for pneumonia as he saw PCP on Monday.O2 statting at 82-87 without 02 (97 with 02). Would like levoquin and steroid called to [facility] in [place]. LOV 06/26/23. Unable to do acute visit.

## 2023-07-13 ENCOUNTER — Encounter: Payer: Self-pay | Admitting: Family Medicine

## 2023-07-18 DIAGNOSIS — J449 Chronic obstructive pulmonary disease, unspecified: Secondary | ICD-10-CM | POA: Diagnosis not present

## 2023-07-18 DIAGNOSIS — J9610 Chronic respiratory failure, unspecified whether with hypoxia or hypercapnia: Secondary | ICD-10-CM | POA: Diagnosis not present

## 2023-07-18 DIAGNOSIS — I493 Ventricular premature depolarization: Secondary | ICD-10-CM | POA: Diagnosis not present

## 2023-07-19 ENCOUNTER — Encounter: Payer: Self-pay | Admitting: Internal Medicine

## 2023-07-19 NOTE — Telephone Encounter (Signed)
There are some's options such as daily 5 mg or 10 mg prednisone but this has long-term detrimental effects to the bone and other side effects.  Therefore we always reserve this is a last resort but it can help COPD patients  The other is to do azithromycin 250 mg 3 times a week as prophylaxis against flareups  A video visit with a nurse practitioner is required before 1 of these treatment options started   Current Outpatient Medications:    ALPRAZolam (XANAX) 0.25 MG tablet, TAKE 1 TABLET BY MOUTH ONCE DAILY AS NEEDED FOR ANXIETY, Disp: 30 tablet, Rfl: 2   arformoterol (BROVANA) 15 MCG/2ML NEBU, Take 2 mLs (15 mcg total) by nebulization 2 (two) times daily., Disp: 120 mL, Rfl: 6   budesonide (PULMICORT) 0.25 MG/2ML nebulizer solution, Take 2 mLs (0.25 mg total) by nebulization in the morning and at bedtime., Disp: 60 mL, Rfl: 11   clopidogrel (PLAVIX) 75 MG tablet, Take 1 tablet by mouth once daily, Disp: 90 tablet, Rfl: 0   cyanocobalamin (VITAMIN B12) 1000 MCG/ML injection, INJECT 1 ML (CC) INTRAMUSCULARLY ONCE A WEEK, Disp: 10 mL, Rfl: 0   cyclobenzaprine (FLEXERIL) 10 MG tablet, TAKE  TABLET BY MOUTH THREE TIMES DAILY AS NEEDED FOR MUSCLE SPASM, Disp: 270 tablet, Rfl: 0   desvenlafaxine (PRISTIQ) 50 MG 24 hr tablet, TAKE 1 TABLET BY MOUTH IN THE MORNING FOR 7 DAYS, THEN 2 TABLETS IN THE MORNING FOR 23 DAYS, Disp: 49 tablet, Rfl: 0   Dupilumab (DUPIXENT) 300 MG/2ML SOPN, Inject 300 mg into the skin every 14 (fourteen) days., Disp: 12 mL, Rfl: 1   fenofibrate 160 MG tablet, Take 1 tablet by mouth once daily, Disp: 90 tablet, Rfl: 0   finasteride (PROSCAR) 5 MG tablet, Take 1 tablet by mouth once daily, Disp: 90 tablet, Rfl: 0   gabapentin (NEURONTIN) 400 MG capsule, TAKE 1 CAPSULE BY MOUTH THREE TIMES DAILY, Disp: 270 capsule, Rfl: 0   insulin lispro (HUMALOG) 100 UNIT/ML injection, Inject 0.04 mLs (4 Units total) into the skin 3 (three) times daily with meals., Disp: 10 mL, Rfl: 11    ipratropium-albuterol (DUONEB) 0.5-2.5 (3) MG/3ML SOLN, USE 1 AMPULE IN NEBULIZER 4 TIMES DAILY, Disp: 360 mL, Rfl: 1   Lancets (ONETOUCH DELICA PLUS LANCET33G) MISC, USE 1  TO CHECK GLUCOSE THREE TIMES DAILY, Disp: 100 each, Rfl: 0   levofloxacin (LEVAQUIN) 500 MG tablet, Take 1 tablet (500 mg total) by mouth daily., Disp: 5 tablet, Rfl: 0   levothyroxine (SYNTHROID) 25 MCG tablet, Take 1 tablet by mouth once daily, Disp: 90 tablet, Rfl: 0   losartan (COZAAR) 50 MG tablet, Take 1 tablet (50 mg total) by mouth daily., Disp: 90 tablet, Rfl: 3   magnesium chloride (SLOW-MAG) 64 MG TBEC SR tablet, Take 1 tablet (64 mg total) by mouth daily., Disp: 90 tablet, Rfl: 3   metFORMIN (GLUCOPHAGE) 1000 MG tablet, TAKE 1 TABLET BY MOUTH TWICE DAILY WITH MEALS, Disp: 180 tablet, Rfl: 0   MYRBETRIQ 25 MG TB24 tablet, Take 1 tablet by mouth once daily, Disp: 30 tablet, Rfl: 0   nitroGLYCERIN (NITROSTAT) 0.4 MG SL tablet, DISSOLVE ONE TABLET UNDER THE TONGUE EVERY 5 TO 10 MINUTES PRIOR TO ACTIVITIES WHICH MIGHT PRECIPITATE AN ATTACK, Disp: 25 tablet, Rfl: 0   nystatin cream (MYCOSTATIN), Apply 1 application topically 2 (two) times daily as needed for dry skin., Disp: , Rfl:    Omega-3 Fatty Acids (FISH OIL) 1000 MG CAPS, Take 2 capsules (2,000  mg total) by mouth in the morning and at bedtime., Disp: 180 capsule, Rfl: 12   omeprazole (PRILOSEC) 20 MG capsule, Take 1 capsule by mouth once daily, Disp: 90 capsule, Rfl: 0   ONETOUCH ULTRA TEST test strip, USE 1 STRIP TO CHECK GLUCOSE IN THE MORNING AND 1 AT BEDTIME AS DIRECTED, Disp: 100 each, Rfl: 0   oxyCODONE-acetaminophen (PERCOCET/ROXICET) 5-325 MG tablet, Take 1 tablet by mouth every 6 (six) hours as needed for severe pain., Disp: 120 tablet, Rfl: 0   predniSONE (DELTASONE) 10 MG tablet, Take 4 tablets (40 mg total) by mouth daily with breakfast for 2 days, THEN 2 tablets (20 mg total) daily with breakfast for 2 days, THEN 1 tablet (10 mg total) daily with breakfast  for 2 days, THEN 0.5 tablets (5 mg total) daily with breakfast for 2 days., Disp: 15 tablet, Rfl: 0   revefenacin (YUPELRI) 175 MCG/3ML nebulizer solution, Take 3 mLs (175 mcg total) by nebulization daily., Disp: 90 mL, Rfl: 11   rosuvastatin (CRESTOR) 40 MG tablet, Take 1 tablet by mouth once daily, Disp: 90 tablet, Rfl: 0   tamsulosin (FLOMAX) 0.4 MG CAPS capsule, Take 2 capsules (0.8 mg total) by mouth daily after supper., Disp: 180 capsule, Rfl: 1   torsemide (DEMADEX) 20 MG tablet, Take 1 tablet (20 mg total) by mouth daily., Disp: 90 tablet, Rfl: 3   TOUJEO SOLOSTAR 300 UNIT/ML Solostar Pen, INJECT 30 UNITS SUBCUTANEOUSLY ONCE DAILY, Disp: 6 mL, Rfl: 0   traZODone (DESYREL) 150 MG tablet, Take 1 tablet (150 mg total) by mouth daily., Disp: 270 tablet, Rfl: 0   VENTOLIN HFA 108 (90 Base) MCG/ACT inhaler, INHALE 2 PUFFS BY MOUTH EVERY 6 HOURS AS NEEDED FOR SHORTNESS OF BREATH FOR WHEEZING, Disp: 18 g, Rfl: 0   Vitamin D, Ergocalciferol, (DRISDOL) 1.25 MG (50000 UNIT) CAPS capsule, TAKE 1 CAPSULE BY MOUTH TWICE A WEEK, Disp: 12 capsule, Rfl: 3

## 2023-07-23 ENCOUNTER — Telehealth (INDEPENDENT_AMBULATORY_CARE_PROVIDER_SITE_OTHER): Payer: Medicare Other | Admitting: Primary Care

## 2023-07-23 DIAGNOSIS — J9611 Chronic respiratory failure with hypoxia: Secondary | ICD-10-CM

## 2023-07-23 DIAGNOSIS — J441 Chronic obstructive pulmonary disease with (acute) exacerbation: Secondary | ICD-10-CM

## 2023-07-23 DIAGNOSIS — J9610 Chronic respiratory failure, unspecified whether with hypoxia or hypercapnia: Secondary | ICD-10-CM

## 2023-07-23 DIAGNOSIS — F17210 Nicotine dependence, cigarettes, uncomplicated: Secondary | ICD-10-CM | POA: Diagnosis not present

## 2023-07-23 DIAGNOSIS — J449 Chronic obstructive pulmonary disease, unspecified: Secondary | ICD-10-CM

## 2023-07-23 MED ORDER — PREDNISONE 10 MG PO TABS: ORAL_TABLET | ORAL | 0 refills | Status: DC

## 2023-07-23 MED ORDER — AZITHROMYCIN 250 MG PO TABS: 250.0000 mg | ORAL_TABLET | ORAL | 2 refills | Status: DC

## 2023-07-23 NOTE — Progress Notes (Signed)
Virtual Visit via Video Note  I connected with Don [name] on 07/23/23 at  9:30 AM EDT by a video enabled telemedicine application and verified that I am speaking with the correct person using two identifiers.  Location: Patient: Home Provider: Office    I discussed the limitations of evaluation and management by telemedicine and the availability of in person appointments. The patient expressed understanding and agreed to proceed.  History of Present Illness: 71 year old, current smoker. PMH significant for cardiomyopathy, coronary artery disease, severe COPD, chronic respiratory failure with hypoxia, throat cancer, stage III chronic kidney disease.  Patient of Dr. Marchelle Gearing,   07/23/2023 Patient presents today for acute video visit.  Accompanied by his wife.  Patient has severe COPD with recurrent respiratory infections requiring antibiotics and prednisone, especially in the winter.  He currently has a cough with yellow mucus.  Last course of prednisone was 1 to 2 weeks ago. He is maintained on Pulmicort, Brovana, Yupelri and Dupixent.  He wears 2L oxygen at night.  Patient and wife are inquiring about starting a daily antibiotic to prevent flareups, they have previously discussed this with Dr. Marchelle Gearing.  Observations/Objective:  No acute respiratory distress; Able to speak in full sentences  Assessment and Plan:  Severe COPD with recurrent exacerbations - Starting Azithromycin MWF to decrease flare ups - Sending in prednisone taper for acute symptoms - Continue Pulmicort, Brovana, Yupelri and Dupixent, if no improvement at follow-up may need to consider daily low-dose prednisone and/or starting PDE inhibitor.  Chronic respiratory failure - Continue 2L oxygen at bedtime   Follow Up Instructions:  4-6 weeks with EKG    I discussed the assessment and treatment plan with the patient. The patient was provided an opportunity to ask questions and all were answered. The patient  agreed with the plan and demonstrated an understanding of the instructions.   The patient was advised to call back or seek an in-person evaluation if the symptoms worsen or if the condition fails to improve as anticipated.  I provided 22 minutes of non-face-to-face time during this encounter.   Glenford Bayley, NP

## 2023-07-24 ENCOUNTER — Ambulatory Visit: Payer: Medicare Other | Attending: Cardiology

## 2023-07-24 DIAGNOSIS — R0781 Pleurodynia: Secondary | ICD-10-CM | POA: Diagnosis not present

## 2023-07-24 DIAGNOSIS — R059 Cough, unspecified: Secondary | ICD-10-CM | POA: Diagnosis not present

## 2023-07-24 DIAGNOSIS — I2510 Atherosclerotic heart disease of native coronary artery without angina pectoris: Secondary | ICD-10-CM | POA: Diagnosis not present

## 2023-07-24 DIAGNOSIS — R0789 Other chest pain: Secondary | ICD-10-CM | POA: Diagnosis not present

## 2023-07-24 DIAGNOSIS — E782 Mixed hyperlipidemia: Secondary | ICD-10-CM

## 2023-07-24 DIAGNOSIS — I493 Ventricular premature depolarization: Secondary | ICD-10-CM | POA: Diagnosis not present

## 2023-07-24 DIAGNOSIS — I10 Essential (primary) hypertension: Secondary | ICD-10-CM

## 2023-07-24 DIAGNOSIS — R0602 Shortness of breath: Secondary | ICD-10-CM | POA: Diagnosis not present

## 2023-07-24 DIAGNOSIS — I452 Bifascicular block: Secondary | ICD-10-CM | POA: Diagnosis not present

## 2023-07-24 LAB — ECHOCARDIOGRAM COMPLETE
Calc EF: 50.9 %
S' Lateral: 3.8 cm
Single Plane A2C EF: 46.6 %
Single Plane A4C EF: 53.1 %

## 2023-07-24 MED ORDER — PERFLUTREN LIPID MICROSPHERE
1.0000 mL | INTRAVENOUS | Status: AC | PRN
Start: 2023-07-24 — End: 2023-07-24
  Administered 2023-07-24: 8 mL via INTRAVENOUS

## 2023-07-27 ENCOUNTER — Encounter: Payer: Self-pay | Admitting: Family Medicine

## 2023-07-27 ENCOUNTER — Other Ambulatory Visit: Payer: Self-pay

## 2023-07-27 ENCOUNTER — Encounter: Payer: Self-pay | Admitting: Cardiology

## 2023-07-27 DIAGNOSIS — R0789 Other chest pain: Secondary | ICD-10-CM

## 2023-07-30 ENCOUNTER — Ambulatory Visit (INDEPENDENT_AMBULATORY_CARE_PROVIDER_SITE_OTHER): Payer: Medicare Other | Admitting: Family Medicine

## 2023-07-30 ENCOUNTER — Encounter: Payer: Self-pay | Admitting: Family Medicine

## 2023-07-30 VITALS — BP 136/70 | HR 72 | Temp 97.3°F | Resp 20 | Ht 70.0 in | Wt 192.0 lb

## 2023-07-30 DIAGNOSIS — Z794 Long term (current) use of insulin: Secondary | ICD-10-CM

## 2023-07-30 DIAGNOSIS — J441 Chronic obstructive pulmonary disease with (acute) exacerbation: Secondary | ICD-10-CM | POA: Insufficient documentation

## 2023-07-30 DIAGNOSIS — E1165 Type 2 diabetes mellitus with hyperglycemia: Secondary | ICD-10-CM | POA: Insufficient documentation

## 2023-07-30 HISTORY — DX: Type 2 diabetes mellitus with hyperglycemia: Z79.4

## 2023-07-30 HISTORY — DX: Type 2 diabetes mellitus with hyperglycemia: E11.65

## 2023-07-30 HISTORY — DX: Chronic obstructive pulmonary disease with (acute) exacerbation: J44.1

## 2023-07-30 MED ORDER — CEFTRIAXONE SODIUM 1 G IJ SOLR
1.0000 g | Freq: Once | INTRAMUSCULAR | Status: AC
Start: 2023-07-30 — End: 2023-07-30
  Administered 2023-07-30: 1 g via INTRAMUSCULAR

## 2023-07-30 MED ORDER — PREDNISONE 20 MG PO TABS
ORAL_TABLET | ORAL | 0 refills | Status: AC
Start: 2023-07-30 — End: 2023-08-29

## 2023-07-30 MED ORDER — IPRATROPIUM-ALBUTEROL 0.5-2.5 (3) MG/3ML IN SOLN
3.0000 mL | Freq: Once | RESPIRATORY_TRACT | Status: AC
Start: 2023-07-30 — End: 2023-07-30
  Administered 2023-07-30: 3 mL via RESPIRATORY_TRACT

## 2023-07-30 MED ORDER — TRIAMCINOLONE ACETONIDE 40 MG/ML IJ SUSP
80.0000 mg | Freq: Once | INTRAMUSCULAR | Status: AC
Start: 2023-07-30 — End: 2023-07-30
  Administered 2023-07-30: 80 mg via INTRAMUSCULAR

## 2023-07-30 MED ORDER — CEFDINIR 300 MG PO CAPS
300.0000 mg | ORAL_CAPSULE | Freq: Two times a day (BID) | ORAL | 0 refills | Status: DC
Start: 2023-07-30 — End: 2023-09-14

## 2023-07-30 NOTE — Assessment & Plan Note (Signed)
Continue scheduled nebulizer treatments. Use DuoNeb 4 times a day for the next 2 days and then 4 times a day as needed. Sent cefdinir 300 mg 2 daily for 10 days. Prednisone 20 mg 3 daily for 3 days, then 2 daily for 3 days, then 1 daily for 3 days, then 1/2 pill until seen by pulmonology later in October. Duoneb given in the office and he moved slightly more air and his wheezing increased.

## 2023-07-30 NOTE — Assessment & Plan Note (Signed)
Use humalog SSI before meals  Continue other diabetes medicines.

## 2023-07-30 NOTE — Patient Instructions (Addendum)
COPD exacerbation: Continue scheduled nebulizer treatments. Use DuoNeb 4 times a day for the next 2 days and then 4 times a day as needed. Sent cefdinir 300 mg 2 daily for 10 days. Prednisone 20 mg 3 daily for 3 days, then 2 daily for 3 days, then 1 daily for 3 days, then 1/2 pill until seen by pulmonology later in October.

## 2023-07-30 NOTE — Progress Notes (Signed)
Acute Office Visit  Subjective:    Patient ID: Don [name], male    DOB: 12-Oct-1951, 72 y.o.   MRN: [id number]  Chief Complaint  Patient presents with   Cough    HPI: Patient is in today for chest congestion, cough, and sinus symptoms.  He has been taking the prednisone dosepak with very little relief.  Home covid test on Saturday was negative.   On brovana neb twice daily, pulmicort neb twice daily. Using duoneb three times a day since Saturday. No relief. Oxygen helps, but put up to 3 1/2 L.   Sugars up to 300 today. Completed steroids yesterday. high on steroids. Patient uses humalog to cover the highs.   Past Medical History:  Diagnosis Date   Absence of bladder continence 01/08/2022   Acute bilateral low back pain without sciatica 01/08/2022   Anxiety state 02/02/2022   Arthritis    Asymptomatic LV dysfunction 10/18/2017   B12 deficiency anemia 08/25/2021   Basal cell carcinoma    Benign prostatic hyperplasia with incomplete bladder emptying 01/14/2019   Blood in ear canal, left 05/13/2021   Bone pain 03/08/2020   Cancer (HCC)    throat - 1997, throat - 2018   Cardiomyopathy, secondary (HCC) 12/01/2016   Overview:  EF 47% 12/26/16   Chest pain syndrome 11/02/2017   Chronic coronary artery disease 10/18/2017   Chronic cough 05/16/2022   Chronic ischemic heart disease 11/13/1998   Jan 22, 2003 Entered By: Aris Lot J Comment:  massive Mi in 2000 per patient hsitory, 2nd Mi in 2001Mar 11, 2004 Entered By: Aris Lot J Comment: Had stent placedin 2000,cath/angio in 2001   Chronic neck pain 05/16/2022   Chronic respiratory failure with hypoxia (HCC) 05/16/2022   CKD (chronic kidney disease) stage 3, GFR 30-59 ml/min (HCC) 07/25/2019   COPD (chronic obstructive pulmonary disease) (HCC)    Coronary artery disease    Coronary artery disease involving native coronary artery of native heart with angina pectoris (HCC) 12/01/2016   Overview:  He has hx of IWMI in remote past,  last cath in 2012 at [facility] showed chronic total occlusion of previously stented RCA, with good collaterals, a 40% LAD stenosis, inferior hypokinesis and EF 45%.    He's been lost to Cardiology f/u since 2015, but has not had any recurrent events   Deficiency anemia 08/25/2021   Depression    Diabetes mellitus (HCC) 03/08/2020   Diabetic polyneuropathy associated with type 2 diabetes mellitus (HCC) 05/16/2022   Diaphoresis 05/16/2022   Diverticulitis 05/02/2020   Dyslipidemia 12/01/2016   Emphysema lung (HCC)    Erectile dysfunction due to diseases classified elsewhere 06/12/2019   Essential hypertension 10/18/2017   GERD (gastroesophageal reflux disease)    Hoarseness 11/15/2016   Hyperlipidemia    Hypertension    Insomnia 01/28/2021   Iron deficiency anemia due to chronic blood loss 08/25/2021   Laceration of thumb, left 12/17/2015   Left flank pain 03/14/2022   Left lower quadrant abdominal tenderness without rebound tenderness 03/14/2022   Lesion of vocal cord    Leukoplakia of larynx 11/15/2016   Long-term use of aspirin therapy 10/02/2018   Malfunction of penile prosthesis (HCC) 01/14/2019   Malignant neoplasm of skin 01/28/2021   Formatting of this note might be different from the original. Jan 22, 2003 Entered By: Aris Lot J Comment: of skin - removed x2 inpast Jan 22, 2003 Entered By: Aris Lot J Comment: of skin - removed x2 inpast   Melanoma of back (HCC)  melanoma on back   Memory loss 03/08/2020   Mood disorder in conditions classified elsewhere 02/02/2022   Myalgia 03/08/2020   Myocardial infarction [facility]) 2000   2 stents   Obstructive chronic bronchitis with exacerbation (HCC) 05/16/2022   Oropharyngeal dysphagia 08/12/2018   Other ill-defined and unknown causes of morbidity and mortality 11/13/1993   Formatting of this note might be different from the original. Jan 31, 2008 Entered By: ARMOUR,ROSS B Comment: lumbar, 8/08 Jan 22, 2003 Entered By: Aris Lot J Comment:  x32yrs in work place  quit 64yrs ago in 2000 Jan 22, 2003 Entered By: Aris Lot J Comment: x43yrs in work place  quit 29yrs ago in 2000 Jan 31, 2008 Entered By: Bennie Pierini B Comment: lumbar, 8/08   PAD (peripheral artery disease) (HCC) 10/18/2017   Peripheral vascular disease (HCC)    iliac artery clot   Peyronie's disease 06/12/2019   Pharyngoesophageal dysphagia 08/12/2018   Pneumonia 02/2015   Pneumothorax 06/07/2020   Preoperative clearance 04/03/2018   Squamous cell carcinoma of larynx (HCC) 12/13/2016   Testicular pain, left 01/14/2019   Throat cancer (HCC)    Tobacco use disorder 06/12/2019    Past Surgical History:  Procedure Laterality Date   ABDOMINAL ANGIOGRAM N/A 01/13/2015   Procedure: ABDOMINAL ANGIOGRAM;  Surgeon: Larina Earthly, MD;  Location: [facility] CATH LAB;  Service: Cardiovascular;  Laterality: N/A;   APPENDECTOMY     BACK SURGERY     CARDIAC CATHETERIZATION  2000/2012   with stents in 2000   CHOLECYSTECTOMY     COLONOSCOPY     ELBOW SURGERY     bilateral   ESOPHAGOGASTRODUODENOSCOPY     KNEE SURGERY Left    MICROLARYNGOSCOPY WITH CO2 LASER AND EXCISION OF VOCAL CORD LESION N/A 11/23/2016   Procedure: MICROLARYNGOSCOPY  AND EXCISION OF VOCAL CORD LESION;  Surgeon: Suzanna Obey, MD;  Location: [facility] OR;  Service: ENT;  Laterality: N/A;   MICROLARYNGOSCOPY WITH CO2 LASER AND EXCISION OF VOCAL CORD LESION N/A 01/11/2017   Procedure: MICROLARYNGOSCOPY WITH CO2 LASER AND EXCISION OF VOCAL CORD LESION;  Surgeon: Suzanna Obey, MD;  Location: [facility] OR;  Service: ENT;  Laterality: N/A;   MICROLARYNGOSCOPY WITH LASER N/A 03/16/2015   Procedure: MICROLARYNGOSCOPY ;  Surgeon: Suzanna Obey, MD;  Location: [facility] OR;  Service: ENT;  Laterality: N/A;   peyronie's surgery     SHOULDER SURGERY Right    rotator cuff   SPINE SURGERY  09/2020   cervical surgery. steel plate, bone spurs, allograft.   THROAT SURGERY  1997   cancer removed    Family History  Problem Relation Age of Onset   Cancer  Mother        bone   Hypertension Mother    Stroke Father    Hypertension Father    Healthy Child    Cancer Other        brain   Diabetes Other     Social History   Socioeconomic History   Marital status: Married    Spouse name: Not on file   Number of children: 4   Years of education: Not on file   Highest education level: GED or equivalent  Occupational History   Occupation: retired  Tobacco Use   Smoking status: Every Day    Current packs/day: 1.00    Average packs/day: 1 pack/day for 50.0 years (50.0 ttl pk-yrs)    Types: Cigarettes   Smokeless tobacco: Never   Tobacco comments:    down to .5 ppd  11/02/2022  hfb  Vaping Use   Vaping status: Former  Substance and Sexual Activity   Alcohol use: Not on file   Drug use: No   Sexual activity: Not on file  Other Topics Concern   Not on file  Social History Narrative   Lives with wife       One level      Right hand   Social Determinants of Health   Financial Resource Strain: Low Risk  (06/22/2023)   [facility]   Overall Financial Resource Strain (CARDIA)    Difficulty of Paying Living Expenses: Not hard at all  Food Insecurity: No Food Insecurity (06/22/2023)   Received from [facility]   Hunger Vital Sign    Worried About Running Out of Food in the Last Year: Never true    Ran Out of Food in the Last Year: Never true  Transportation Needs: No Transportation Needs (06/22/2023)   Received from [facility] - Transportation    In the past 12 months, has lack of transportation kept you from medical appointments or from getting medications?: No    Lack of Transportation (Non-Medical): No  Physical Activity: Sufficiently Active (01/11/2023)   Exercise Vital Sign    Days of Exercise per Week: 5 days    Minutes of Exercise per Session: 30 min  Stress: No Stress Concern Present (01/11/2023)   [employer] of Occupational Health -  Occupational Stress Questionnaire    Feeling of Stress : Not at all  Social Connections: Moderately Isolated (01/11/2023)   Social Connection and Isolation Panel [NHANES]    Frequency of Communication with Friends and Family: Three times a week    Frequency of Social Gatherings with Friends and Family: Three times a week    Attends Religious Services: Never    Active Member of Clubs or Organizations: No    Attends Banker Meetings: Never    Marital Status: Married  Catering manager Violence: Not At Risk (01/11/2023)   Humiliation, Afraid, Rape, and Kick questionnaire    Fear of Current or Ex-Partner: No    Emotionally Abused: No    Physically Abused: No    Sexually Abused: No    Outpatient Medications Prior to Visit  Medication Sig Dispense Refill   ALPRAZolam (XANAX) 0.25 MG tablet TAKE 1 TABLET BY MOUTH ONCE DAILY AS NEEDED FOR ANXIETY 30 tablet 2   arformoterol (BROVANA) 15 MCG/2ML NEBU Take 2 mLs (15 mcg total) by nebulization 2 (two) times daily. 120 mL 6   azithromycin (ZITHROMAX) 250 MG tablet Take 1 tablet (250 mg total) by mouth every Monday, Wednesday, and Friday. 12 tablet 2   budesonide (PULMICORT) 0.25 MG/2ML nebulizer solution Take 2 mLs (0.25 mg total) by nebulization in the morning and at bedtime. 60 mL 11   clopidogrel (PLAVIX) 75 MG tablet Take 1 tablet by mouth once daily 90 tablet 0   cyanocobalamin (VITAMIN B12) 1000 MCG/ML injection INJECT 1 ML (CC) INTRAMUSCULARLY ONCE A WEEK 10 mL 0   cyclobenzaprine (FLEXERIL) 10 MG tablet TAKE  TABLET BY MOUTH THREE TIMES DAILY AS NEEDED FOR MUSCLE SPASM 270 tablet 0   desvenlafaxine (PRISTIQ) 50 MG 24 hr tablet TAKE 1 TABLET BY MOUTH IN THE MORNING FOR 7 DAYS, THEN 2 TABLETS IN THE MORNING FOR 23 DAYS 49 tablet 0   Dupilumab (DUPIXENT) 300 MG/2ML SOPN Inject 300 mg into the skin every 14 (fourteen) days. 12 mL 1  fenofibrate 160 MG tablet Take 1 tablet by mouth once daily 90 tablet 0   finasteride (PROSCAR) 5 MG  tablet Take 1 tablet by mouth once daily 90 tablet 0   gabapentin (NEURONTIN) 400 MG capsule TAKE 1 CAPSULE BY MOUTH THREE TIMES DAILY 270 capsule 0   insulin lispro (HUMALOG) 100 UNIT/ML injection Inject 0.04 mLs (4 Units total) into the skin 3 (three) times daily with meals. 10 mL 11   ipratropium-albuterol (DUONEB) 0.5-2.5 (3) MG/3ML SOLN USE 1 AMPULE IN NEBULIZER 4 TIMES DAILY 360 mL 1   Lancets (ONETOUCH DELICA PLUS LANCET33G) MISC USE 1  TO CHECK GLUCOSE THREE TIMES DAILY 100 each 0   levothyroxine (SYNTHROID) 25 MCG tablet Take 1 tablet by mouth once daily 90 tablet 0   losartan (COZAAR) 50 MG tablet Take 1 tablet (50 mg total) by mouth daily. 90 tablet 3   magnesium chloride (SLOW-MAG) 64 MG TBEC SR tablet Take 1 tablet (64 mg total) by mouth daily. 90 tablet 3   metFORMIN (GLUCOPHAGE) 1000 MG tablet TAKE 1 TABLET BY MOUTH TWICE DAILY WITH MEALS 180 tablet 0   MYRBETRIQ 25 MG TB24 tablet Take 1 tablet by mouth once daily 30 tablet 0   nitroGLYCERIN (NITROSTAT) 0.4 MG SL tablet DISSOLVE ONE TABLET UNDER THE TONGUE EVERY 5 TO 10 MINUTES PRIOR TO ACTIVITIES WHICH MIGHT PRECIPITATE AN ATTACK 25 tablet 0   nystatin cream (MYCOSTATIN) Apply 1 application topically 2 (two) times daily as needed for dry skin.     Omega-3 Fatty Acids (FISH OIL) 1000 MG CAPS Take 2 capsules (2,000 mg total) by mouth in the morning and at bedtime. 180 capsule 12   omeprazole (PRILOSEC) 20 MG capsule Take 1 capsule by mouth once daily 90 capsule 0   ONETOUCH ULTRA TEST test strip USE 1 STRIP TO CHECK GLUCOSE IN THE MORNING AND 1 AT BEDTIME AS DIRECTED 100 each 0   oxyCODONE-acetaminophen (PERCOCET/ROXICET) 5-325 MG tablet Take 1 tablet by mouth every 6 (six) hours as needed for severe pain. 120 tablet 0   revefenacin (YUPELRI) 175 MCG/3ML nebulizer solution Take 3 mLs (175 mcg total) by nebulization daily. 90 mL 11   rosuvastatin (CRESTOR) 40 MG tablet Take 1 tablet by mouth once daily 90 tablet 0   tamsulosin (FLOMAX)  0.4 MG CAPS capsule Take 2 capsules (0.8 mg total) by mouth daily after supper. 180 capsule 1   torsemide (DEMADEX) 20 MG tablet Take 1 tablet (20 mg total) by mouth daily. 90 tablet 3   TOUJEO SOLOSTAR 300 UNIT/ML Solostar Pen INJECT 30 UNITS SUBCUTANEOUSLY ONCE DAILY 6 mL 0   traZODone (DESYREL) 150 MG tablet Take 1 tablet (150 mg total) by mouth daily. 270 tablet 0   VENTOLIN HFA 108 (90 Base) MCG/ACT inhaler INHALE 2 PUFFS BY MOUTH EVERY 6 HOURS AS NEEDED FOR SHORTNESS OF BREATH FOR WHEEZING 18 g 0   Vitamin D, Ergocalciferol, (DRISDOL) 1.25 MG (50000 UNIT) CAPS capsule TAKE 1 CAPSULE BY MOUTH TWICE A WEEK 12 capsule 3   levofloxacin (LEVAQUIN) 500 MG tablet Take 1 tablet (500 mg total) by mouth daily. 5 tablet 0   predniSONE (DELTASONE) 10 MG tablet 4 tabs for 3 days, then 3 tabs for 3 days, 2 tabs for 3 days, then 1 tab for 3 days, then stop 30 tablet 0   No facility-administered medications prior to visit.    Allergies  Allergen Reactions   Penicillins Rash and Hives    Has patient had a PCN  reaction causing immediate rash, facial/tongue/throat swelling, SOB or lightheadedness with hypotension:unsure Has patient had a PCN reaction causing severe rash involving mucus membranes or skin necrosis:unsure Has patient had a PCN reaction that required hospitalization:No Has patient had a PCN reaction occurring within the last 10 years:No If all of the above answers are "NO", then may proceed with Cephalosporin use.  rash   Bactrim [Sulfamethoxazole-Trimethoprim]     Dizziness, confusion, involuntary movements   Doxycycline Rash   Iodinated Contrast Media Rash    Also developed blisters   Penicillin G Rash   Tramadol Rash    Review of Systems  Constitutional:  Negative for chills and fever.  HENT:  Positive for congestion and facial swelling. Negative for rhinorrhea and sore throat.   Respiratory:  Positive for cough, shortness of breath and wheezing.   Cardiovascular:  Negative for  chest pain and palpitations.  Gastrointestinal:  Positive for abdominal pain. Negative for constipation, diarrhea, nausea and vomiting.  Genitourinary:  Negative for dysuria and urgency.  Musculoskeletal:  Negative for arthralgias, back pain and myalgias.  Neurological:  Positive for light-headedness and headaches. Negative for dizziness.  Psychiatric/Behavioral:  Negative for dysphoric mood. The patient is not nervous/anxious.        Objective:        07/30/2023    1:34 PM 07/06/2023   10:55 AM 07/03/2023   10:04 AM  Vitals with BMI  Height 5\' 10"  5\' 10"  5\' 10"   Weight 192 lbs 197 lbs 200 lbs  BMI 27.55 28.27 28.7  Systolic 136 124 469  Diastolic 70 56 50  Pulse 72 44 80    No data found.   Physical Exam Vitals reviewed.  Constitutional:      Appearance: Normal appearance.  HENT:     Right Ear: Tympanic membrane normal.     Left Ear: Tympanic membrane normal.     Nose: Congestion (pale. sinus tenderness) present.     Mouth/Throat:     Mouth: Mucous membranes are dry.     Pharynx: No oropharyngeal exudate or posterior oropharyngeal erythema.  Cardiovascular:     Rate and Rhythm: Normal rate and regular rhythm.     Heart sounds: Normal heart sounds.  Pulmonary:     Effort: Pulmonary effort is normal.     Breath sounds: Wheezing (Inspiratory and expiratory diffusely) present. No rhonchi or rales.  Abdominal:     General: Bowel sounds are normal.     Palpations: Abdomen is soft.     Tenderness: There is no abdominal tenderness.  Musculoskeletal:     Right lower leg: No edema.     Left lower leg: No edema.  Neurological:     Mental Status: He is alert.  Psychiatric:        Mood and Affect: Mood normal.        Behavior: Behavior normal.     Health Maintenance Due  Topic Date Due   OPHTHALMOLOGY EXAM  Never done   Hepatitis C Screening  Never done   Zoster Vaccines- Shingrix (1 of 2) Never done   COVID-19 Vaccine (3 - Mixed Product risk series) 10/20/2022    INFLUENZA VACCINE  06/14/2023    There are no preventive care reminders to display for this patient.   Lab Results  Component Value Date   TSH 2.200 05/31/2023   Lab Results  Component Value Date   WBC 11.5 (H) 07/06/2023   HGB 11.9 (L) 07/06/2023   HCT 38.7 07/06/2023   MCV 80  07/06/2023   PLT 214 07/06/2023   Lab Results  Component Value Date   NA 144 07/06/2023   K 4.3 07/06/2023   CO2 26 07/06/2023   GLUCOSE 155 (H) 07/06/2023   BUN 16 07/06/2023   CREATININE 1.12 07/06/2023   BILITOT 0.3 07/06/2023   ALKPHOS 64 07/06/2023   AST 11 07/06/2023   ALT 13 07/06/2023   PROT 6.5 07/06/2023   ALBUMIN 4.1 07/06/2023   CALCIUM 9.0 07/06/2023   ANIONGAP 7 11/10/2022   EGFR 69 07/06/2023   GFR 74.13 08/17/2022   Lab Results  Component Value Date   CHOL 119 07/06/2023   Lab Results  Component Value Date   HDL 27 (L) 07/06/2023   Lab Results  Component Value Date   LDLCALC 46 07/06/2023   Lab Results  Component Value Date   TRIG 297 (H) 07/06/2023   Lab Results  Component Value Date   CHOLHDL 4.4 07/06/2023   Lab Results  Component Value Date   HGBA1C 8.1 (H) 07/06/2023       Assessment & Plan:  COPD exacerbation (HCC) Assessment & Plan: Continue scheduled nebulizer treatments. Use DuoNeb 4 times a day for the next 2 days and then 4 times a day as needed. Sent cefdinir 300 mg 2 daily for 10 days. Prednisone 20 mg 3 daily for 3 days, then 2 daily for 3 days, then 1 daily for 3 days, then 1/2 pill until seen by pulmonology later in October. Duoneb given in the office and he moved slightly more air and his wheezing increased.   Orders: -     Triamcinolone Acetonide -     cefTRIAXone Sodium -     Cefdinir; Take 1 capsule (300 mg total) by mouth 2 (two) times daily.  Dispense: 20 capsule; Refill: 0 -     predniSONE; Take 3 tablets (60 mg total) by mouth daily with breakfast for 3 days, THEN 2 tablets (40 mg total) daily with breakfast for 3 days, THEN 1  tablet (20 mg total) daily with breakfast for 3 days, THEN 0.5 tablets (10 mg total) daily with breakfast for 21 days.  Dispense: 28.5 tablet; Refill: 0 -     Ipratropium-Albuterol  Type 2 diabetes mellitus with hyperglycemia, with long-term current use of insulin (HCC) Assessment & Plan: Use humalog SSI before meals  Continue other diabetes medicines.      Meds ordered this encounter  Medications   triamcinolone acetonide (KENALOG-40) injection 80 mg   cefTRIAXone (ROCEPHIN) injection 1 g   cefdinir (OMNICEF) 300 MG capsule    Sig: Take 1 capsule (300 mg total) by mouth 2 (two) times daily.    Dispense:  20 capsule    Refill:  0   predniSONE (DELTASONE) 20 MG tablet    Sig: Take 3 tablets (60 mg total) by mouth daily with breakfast for 3 days, THEN 2 tablets (40 mg total) daily with breakfast for 3 days, THEN 1 tablet (20 mg total) daily with breakfast for 3 days, THEN 0.5 tablets (10 mg total) daily with breakfast for 21 days.    Dispense:  28.5 tablet    Refill:  0   ipratropium-albuterol (DUONEB) 0.5-2.5 (3) MG/3ML nebulizer solution 3 mL    No orders of the defined types were placed in this encounter.    Follow-up: No follow-ups on file.  An After Visit Summary was printed and given to the patient.  Blane Ohara, MD [facility] [phone]

## 2023-07-31 ENCOUNTER — Other Ambulatory Visit: Payer: Self-pay

## 2023-07-31 DIAGNOSIS — I5022 Chronic systolic (congestive) heart failure: Secondary | ICD-10-CM

## 2023-07-31 MED ORDER — SPIRONOLACTONE 25 MG PO TABS: 25.0000 mg | ORAL_TABLET | Freq: Every day | ORAL | 3 refills | Status: DC

## 2023-08-01 ENCOUNTER — Encounter: Payer: Self-pay | Admitting: Internal Medicine

## 2023-08-01 ENCOUNTER — Telehealth: Payer: Self-pay | Admitting: Internal Medicine

## 2023-08-02 NOTE — Telephone Encounter (Signed)
This requires admissioin/ER visi. Esp with low o2 -> the ER might doa CT chest  Outpaitnet  Rx for AECOPD works > 70-80% of the time but not 100% of the time  If low o2 continues we do not have enough time  to help him as outpatient

## 2023-08-02 NOTE — Telephone Encounter (Signed)
See telephone encounter from 9/18. Patient was told he needed to be seen at the ED. Patient refused.  Nothing further needed.

## 2023-08-02 NOTE — Telephone Encounter (Signed)
Pt declined going to the ED, did you want to try the RX ? Please advise

## 2023-08-02 NOTE — Telephone Encounter (Signed)
Pt wife states Don [name] has been having a ongoing cpod exacerbation. Had a Video visit with BW 07/24/23. She prescribe azithromycin, prednisone and  advised pt to continue with his inhalers and neb. Pt also visited his PCP who also prescribed prednisone. Wife states he is just not getting better and without oxygen his stats are in the 60's. The [facility] are interested in a CT since the last one was in June or July. Dr. Marchelle Gearing please advise on how you would like to proceed. Ov has been scheduled for Nov 5.

## 2023-08-02 NOTE — Telephone Encounter (Signed)
Issue is it can be heart or lung  an dif lung copd or pneumonia or PE. Why are they refusing to go to ER? The pusle ox o80s is it on room air?  Or how much o2?  His address I [address] [place] [address] -> he can come to our lab do cbc with diff, BMET, LFT, BNP, troponin. Lactate stat 0, Then got get a CTA today. If something bad happens to him during this time we cannot be responsible. Labs will not be available till tomorrow  We are too full to see him

## 2023-08-02 NOTE — Telephone Encounter (Signed)
Pt declined nfn

## 2023-08-06 DIAGNOSIS — I10 Essential (primary) hypertension: Secondary | ICD-10-CM | POA: Insufficient documentation

## 2023-08-06 DIAGNOSIS — J449 Chronic obstructive pulmonary disease, unspecified: Secondary | ICD-10-CM | POA: Insufficient documentation

## 2023-08-06 NOTE — Progress Notes (Deleted)
Cardiology Office Note:    Date:  08/06/2023   ID:  Don [name], DOB [birth year], MRN [id number]  PCP:  Blane Ohara, MD  Cardiologist:  Norman Herrlich, MD    Referring MD: Blane Ohara, MD    ASSESSMENT:    No diagnosis found. PLAN:    In order of problems listed above:  ***   Next appointment: ***   Medication Adjustments/Labs and Tests Ordered: Current medicines are reviewed at length with the patient today.  Concerns regarding medicines are outlined above.  No orders of the defined types were placed in this encounter.  No orders of the defined types were placed in this encounter.    History of Present Illness:    Don [name] is a 72 y.o. male with a hx of frequent PVCs bifascicular heart block coronary artery disease hypertensive heart disease with stage III CKD COPD with chronic hypoxic respiratory failure and hyperlipidemia last seen 06/26/2023.  Has a history of LV dysfunction EF 45 to 50% and previous inferior wall myocardial infarction with coronary angiography in 2012 showing chronic occlusion of the right coronary artery at site of previous stenting.  Previous treated with mexiletine subsequently amiodarone both stopped.  At his last visit he was referred back to EP.  He recent echocardiogram 07/24/2023 showing low normal ejection fraction of 50 to 55% normal right ventricular size and function and no significant valvular abnormality.  His proBNP level was significantly elevated 1629 elevated white count 18,400 and his last blood sugar a month ago 349 with a creatinine 1.24 GFR 61 cc potassium 4.4.  He was transitioned to higher dose diuretic with torsemide. Compliance with diet, lifestyle and medications: *** Past Medical History:  Diagnosis Date   Absence of bladder continence 01/08/2022   Acute cough 07/07/2023   Acute infection of nasal sinus 01/13/2023   Anxiety state 02/02/2022   Arthritis    Asymptomatic LV dysfunction 10/18/2017    Atherosclerosis of aorta (HCC) 01/13/2023   Atypical chest pain 11/09/2022   B12 deficiency anemia 08/25/2021   Basal cell carcinoma    Benign prostatic hyperplasia with incomplete bladder emptying 01/14/2019   Bone pain 03/08/2020   Cancer (HCC)    throat - 1997, throat - 2018   Cardiomyopathy, secondary (HCC) 12/01/2016   Overview:  EF 47% 12/26/16   Chest pain syndrome 11/02/2017   Chronic coronary artery disease 10/18/2017   Chronic ischemic heart disease 11/13/1998   Jan 22, 2003 Entered By: Aris Lot J Comment:  massive Mi in 2000 per patient hsitory, 2nd Mi in 2001Mar 11, 2004 Entered By: Aris Lot J Comment: Had stent placedin 2000,cath/angio in 2001   Chronic neck pain 05/16/2022   Chronic pain syndrome 06/04/2022   Chronic respiratory failure with hypoxia (HCC) 05/16/2022   CKD (chronic kidney disease) stage 3, GFR 30-59 ml/min (HCC) 07/25/2019   COPD (chronic obstructive pulmonary disease) (HCC)    COPD exacerbation (HCC) 07/30/2023   Coronary artery disease involving native coronary artery of native heart with angina pectoris (HCC) 12/01/2016   Overview:  He has hx of IWMI in remote past, last cath in 2012 at [facility] showed chronic total occlusion of previously stented RCA, with good collaterals, a 40% LAD stenosis, inferior hypokinesis and EF 45%.    He's been lost to Cardiology f/u since 2015, but has not had any recurrent events   Deficiency anemia 08/25/2021   Diabetic polyneuropathy associated with type 2 diabetes mellitus (HCC) 05/16/2022   Dyslipidemia 12/01/2016   Encounter for  Medicare annual wellness exam 01/13/2023   Erectile dysfunction due to diseases classified elsewhere 06/12/2019   GERD (gastroesophageal reflux disease)    Hallucination 10/08/2022   Hyperlipidemia    Hypertension    Hypertensive heart disease with heart failure (HCC)    Insomnia 01/28/2021   Iron deficiency 07/07/2023   Iron deficiency anemia due to chronic blood loss 08/25/2021    Lesion of vocal cord    Leukoplakia of larynx 11/15/2016   Long-term use of aspirin therapy 10/02/2018   Lumbar back pain 06/04/2022   Lung nodules 08/17/2022   Malfunction of penile prosthesis (HCC) 01/14/2019   Malignant neoplasm of skin 01/28/2021   Formatting of this note might be different from the original. Jan 22, 2003 Entered By: Aris Lot J Comment: of skin - removed x2 inpast Jan 22, 2003 Entered By: Aris Lot J Comment: of skin - removed x2 inpast   Melanoma of back (HCC)    melanoma on back   Memory loss 03/08/2020   Moderate recurrent major depression (HCC) 03/02/2023   Mucopurulent chronic bronchitis (HCC) 04/08/2023   Other ill-defined and unknown causes of morbidity and mortality 11/13/1993   Formatting of this note might be different from the original. Jan 31, 2008 Entered By: ARMOUR,ROSS B Comment: lumbar, 8/08 Jan 22, 2003 Entered By: Aris Lot J Comment: x87yrs in work place  quit 69yrs ago in 2000 Jan 22, 2003 Entered By: Aris Lot J Comment: x23yrs in work place  quit 31yrs ago in 2000 Jan 31, 2008 Entered By: Bennie Pierini B Comment: lumbar, 8/08   PAD (peripheral artery disease) (HCC) 10/18/2017   Peripheral vascular disease (HCC)    iliac artery clot   Peyronie's disease 06/12/2019   Squamous cell carcinoma of larynx (HCC) 12/13/2016   Throat cancer (HCC)    Tobacco use disorder 06/12/2019   Type 2 diabetes mellitus with hyperglycemia, with long-term current use of insulin (HCC) 07/30/2023   URI, acute 05/31/2023   Urinary urgency 04/08/2023   Ventricular ectopy 11/09/2022   Vertigo 04/08/2023   Vitamin D deficiency 01/13/2023    Current Medications: No outpatient medications have been marked as taking for the 08/07/23 encounter (Appointment) with Baldo Daub, MD.      EKGs/Labs/Other Studies Reviewed:    The following studies were reviewed today:  Cardiac Studies & Procedures     STRESS TESTS  MYOCARDIAL PERFUSION IMAGING  05/24/2022  Narrative   The study is normal. The study is intermediate risk.   LV perfusion is abnormal. There is no evidence of ischemia. There is evidence of infarction. Defect 1: There is a medium defect with moderate reduction in uptake present in the inferior location(s) that is fixed. There is abnormal wall motion in the defect area. Consistent with infarction.   Left ventricular function is abnormal. Global function is mildly reduced. Nuclear stress EF: 49 %. The left ventricular ejection fraction is mildly decreased (45-54%). End diastolic cavity size is normal.   Prior study not available for comparison.   ECHOCARDIOGRAM  ECHOCARDIOGRAM COMPLETE 07/24/2023  Narrative ECHOCARDIOGRAM REPORT    Patient Name:   Don [name] Date of Exam: 07/24/2023 Medical Rec #:  [id number]          Height:       70.0 in Accession #:    [id number]         Weight:       197.0 lb Date of Birth:  1951/05/25          BSA:  2.074 m Patient Age:    71 years           BP:           124/56 mmHg Patient Gender: M                  HR:           72 bpm. Exam Location:  [place]  Procedure: 2D Echo, Cardiac Doppler, Color Doppler and Intracardiac Opacification Agent  Indications:    Frequent PVCs [I49.3 (ICD-10-CM)]; RBBB (right bundle branch block with left anterior fascicular block) [I45.2 (ICD-10-CM)]; Coronary artery disease involving native coronary artery of native heart without angina pectoris [I25.10 (ICD-10-CM)]; Essential hypertension [I10 (ICD-10-CM)]; Mixed hyperlipidemia [E78.2 (ICD-10-CM)]  History:        Patient has prior history of Echocardiogram examinations, most recent 07/25/2022.  Sonographer:    Louie Boston RDCS Referring Phys: [id number] Baldo Daub   Sonographer Comments: Technically challenging study due to limited acoustic windows. Global longitudinal strain was attempted. IMPRESSIONS   1. Left ventricular ejection fraction, by estimation, is 50 to 55%. The  left ventricle has low normal function. The left ventricle demonstrates global hypokinesis. Left ventricular diastolic parameters are indeterminate. 2. Right ventricular systolic function is normal. The right ventricular size is normal. There is normal pulmonary artery systolic pressure. 3. Trace to small pericardial effusion anteriorlly. There is no evidence of cardiac tamponade. 4. The mitral valve was not well visualized. Trivial mitral valve regurgitation. No evidence of mitral stenosis. 5. The aortic valve is tricuspid. Aortic valve regurgitation is not visualized. No aortic stenosis is present. 6. There is borderline dilatation of the ascending aorta, measuring 36 mm. 7. The inferior vena cava is normal in size with greater than 50% respiratory variability, suggesting right atrial pressure of 3 mmHg.  Comparison(s): A prior study was performed on 06/03/2021. LVEF was 55-60% and small pericardial effusion and fat pad reported.  FINDINGS Left Ventricle: Left ventricular ejection fraction, by estimation, is 50 to 55%. The left ventricle has low normal function. The left ventricle demonstrates global hypokinesis. Definity contrast agent was given IV to delineate the left ventricular endocardial borders. The left ventricular internal cavity size was normal in size. There is no left ventricular hypertrophy. Left ventricular diastolic parameters are indeterminate.  Right Ventricle: The right ventricular size is normal. Right vetricular wall thickness was not well visualized. Right ventricular systolic function is normal. There is normal pulmonary artery systolic pressure. The tricuspid regurgitant velocity is 2.41 m/s, and with an assumed right atrial pressure of 3 mmHg, the estimated right ventricular systolic pressure is 26.2 mmHg.  Left Atrium: Left atrial size was not well visualized.  Right Atrium: Right atrial size was not well visualized.  Pericardium: Trivial pericardial effusion is  present. The pericardial effusion is localized near the right atrium and anterior to the right ventricle. There is no evidence of cardiac tamponade. Presence of epicardial fat layer.  Mitral Valve: The mitral valve was not well visualized. Trivial mitral valve regurgitation. No evidence of mitral valve stenosis.  Tricuspid Valve: The tricuspid valve is not well visualized. Tricuspid valve regurgitation is trivial.  Aortic Valve: The aortic valve is tricuspid. Aortic valve regurgitation is not visualized. No aortic stenosis is present.  Pulmonic Valve: The pulmonic valve was not well visualized. Pulmonic valve regurgitation is not visualized.  Aorta: The aortic root is normal in size and structure. There is borderline dilatation of the ascending aorta, measuring 36 mm.  Venous: The inferior  vena cava is normal in size with greater than 50% respiratory variability, suggesting right atrial pressure of 3 mmHg.  IAS/Shunts: The interatrial septum was not well visualized.   LEFT VENTRICLE PLAX 2D LVIDd:         5.70 cm     Diastology LVIDs:         3.80 cm     LV e' medial:    5.55 cm/s LV PW:         1.10 cm     LV E/e' medial:  9.1 LV IVS:        1.10 cm     LV e' lateral:   6.64 cm/s LVOT diam:     2.10 cm     LV E/e' lateral: 7.6 LV SV:         64 LV SV Index:   31 LVOT Area:     3.46 cm  LV Volumes (MOD) LV vol d, MOD A2C: 74.9 ml LV vol d, MOD A4C: 89.2 ml LV vol s, MOD A2C: 40.0 ml LV vol s, MOD A4C: 41.8 ml LV SV MOD A2C:     34.9 ml LV SV MOD A4C:     89.2 ml LV SV MOD BP:      42.8 ml  RIGHT VENTRICLE            IVC RV Basal diam:  2.60 cm    IVC diam: 1.60 cm RV S prime:     9.90 cm/s TAPSE (M-mode): 1.6 cm  LEFT ATRIUM             Index        RIGHT ATRIUM           Index LA diam:        3.70 cm 1.78 cm/m   RA Area:     12.40 cm LA Vol (A2C):   52.8 ml 25.46 ml/m  RA Volume:   30.00 ml  14.47 ml/m LA Vol (A4C):   49.3 ml 23.77 ml/m LA Biplane Vol: 51.4 ml  24.78 ml/m AORTIC VALVE LVOT Vmax:   85.95 cm/s LVOT Vmean:  58.450 cm/s LVOT VTI:    0.184 m  AORTA Ao Root diam: 3.60 cm Ao Asc diam:  3.60 cm Ao Desc diam: 2.90 cm  MV E velocity: 50.30 cm/s   TRICUSPID VALVE MV A velocity: 105.00 cm/s  TR Peak grad:   23.2 mmHg MV E/A ratio:  0.48         TR Vmax:        241.00 cm/s  SHUNTS Systemic VTI:  0.18 m Systemic Diam: 2.10 cm  Sreedhar reddy Madireddy Electronically signed by Vern Claude reddy Madireddy Signature Date/Time: 07/24/2023/5:30:35 PM    Final    MONITORS  LONG TERM MONITOR (3-14 DAYS) 07/18/2023  Narrative Patch Wear Time:  2 days and 10 hours  Patient had a min HR of 63 bpm, max HR of 86 bpm, and avg HR of 72 bpm. Predominant underlying rhythm was Sinus Rhythm. 33.6% ventricular ectopy Less than 1% supraventricular ectopy Patient triggered episodes associated with sinus rhythm and PVCs  Will Camnitz, MD               Recent Labs: 11/09/2022: B Natriuretic Peptide 216.5 05/31/2023: Magnesium 1.6; TSH 2.200 07/06/2023: ALT 13; BUN 16; Creatinine, Ser 1.12; Hemoglobin 11.9; NT-Pro BNP 665; Platelets 214; Potassium 4.3; Sodium 144  Recent Lipid Panel    Component Value Date/Time   CHOL 119 07/06/2023 1147  TRIG 297 (H) 07/06/2023 1147   HDL 27 (L) 07/06/2023 1147   CHOLHDL 4.4 07/06/2023 1147   LDLCALC 46 07/06/2023 1147    Physical Exam:    VS:  There were no vitals taken for this visit.    Wt Readings from Last 3 Encounters:  07/30/23 192 lb (87.1 kg)  07/06/23 197 lb (89.4 kg)  07/03/23 200 lb (90.7 kg)     GEN: *** Well nourished, well developed in no acute distress HEENT: Normal NECK: No JVD; No carotid bruits LYMPHATICS: No lymphadenopathy CARDIAC: ***RRR, no murmurs, rubs, gallops RESPIRATORY:  Clear to auscultation without rales, wheezing or rhonchi  ABDOMEN: Soft, non-tender, non-distended MUSCULOSKELETAL:  No edema; No deformity  SKIN: Warm and dry NEUROLOGIC:  Alert and oriented  x 3 PSYCHIATRIC:  Normal affect    Signed, Norman Herrlich, MD  08/06/2023 2:25 PM    [facility]

## 2023-08-07 ENCOUNTER — Ambulatory Visit: Payer: Medicare Other | Admitting: Cardiology

## 2023-08-10 ENCOUNTER — Other Ambulatory Visit: Payer: Self-pay | Admitting: Family Medicine

## 2023-08-10 MED ORDER — OXYCODONE-ACETAMINOPHEN 5-325 MG PO TABS: 1.0000 | ORAL_TABLET | Freq: Four times a day (QID) | ORAL | 0 refills | Status: DC | PRN

## 2023-08-13 ENCOUNTER — Other Ambulatory Visit: Payer: Self-pay | Admitting: Family Medicine

## 2023-08-16 ENCOUNTER — Encounter: Payer: Self-pay | Admitting: Internal Medicine

## 2023-08-17 DIAGNOSIS — J9610 Chronic respiratory failure, unspecified whether with hypoxia or hypercapnia: Secondary | ICD-10-CM | POA: Diagnosis not present

## 2023-08-17 DIAGNOSIS — J449 Chronic obstructive pulmonary disease, unspecified: Secondary | ICD-10-CM | POA: Diagnosis not present

## 2023-08-21 ENCOUNTER — Other Ambulatory Visit: Payer: Self-pay | Admitting: Family Medicine

## 2023-08-21 DIAGNOSIS — E782 Mixed hyperlipidemia: Secondary | ICD-10-CM

## 2023-09-07 ENCOUNTER — Other Ambulatory Visit: Payer: Self-pay | Admitting: Family Medicine

## 2023-09-07 DIAGNOSIS — J441 Chronic obstructive pulmonary disease with (acute) exacerbation: Secondary | ICD-10-CM

## 2023-09-08 ENCOUNTER — Telehealth: Payer: Self-pay

## 2023-09-08 ENCOUNTER — Other Ambulatory Visit: Payer: Self-pay

## 2023-09-08 MED ORDER — PREDNISONE 20 MG PO TABS: 20.0000 mg | ORAL_TABLET | Freq: Every day | ORAL | 0 refills | Status: AC

## 2023-09-08 MED ORDER — LEVOFLOXACIN 500 MG PO TABS: 500.0000 mg | ORAL_TABLET | Freq: Every day | ORAL | 0 refills | Status: AC

## 2023-09-08 NOTE — Telephone Encounter (Signed)
Patient's wife [name] called stating her husband has a COPD exacerbation and he is refusing to go to the ED. His pulmonologist has given him ZPAK three days a week but she states it does not help at all.   Called in  Levquin 500 mg daily for 7 days and prednisone 20 mg daily for 7 days to [facility] in [place].  Thanks, Windell Moment MD

## 2023-09-11 ENCOUNTER — Other Ambulatory Visit: Payer: Self-pay | Admitting: Family Medicine

## 2023-09-11 MED ORDER — OXYCODONE-ACETAMINOPHEN 5-325 MG PO TABS: 1.0000 | ORAL_TABLET | Freq: Four times a day (QID) | ORAL | 0 refills | Status: DC | PRN

## 2023-09-13 NOTE — Progress Notes (Signed)
Cardiology Office Note:    Date:  09/14/2023   ID:  Don [name], DOB 1951-09-25, MRN [id number]  PCP:  Blane Ohara, MD  Cardiologist:  Norman Herrlich, MD    Referring MD: Blane Ohara, MD    ASSESSMENT:    1. Frequent PVCs   2. RBBB (right bundle branch block with left anterior fascicular block)   3. Coronary artery disease involving native coronary artery of native heart without angina pectoris   4. Hypertensive heart disease, unspecified whether heart failure present   5. Mixed hyperlipidemia    PLAN:    In order of problems listed above:  He is quite symptomatic as a consequence of his frequent PVCs diastolic heart failure and underlying lung disease I think he would benefit from PVC ablation I will refer him to Dr. Sedalia Muta at [facility] family request For now hold his diuretic he has no edema his wife is an Charity fundraiser and if he requires that she will give him a tablet time to time The family would also like an opportunity to see a pulmonologist at [facility] if he has procedures done   Next appointment: I will reach out to Dr. Jean Rosenthal and I will plan to see him in several months in the office   Medication Adjustments/Labs and Tests Ordered: Current medicines are reviewed at length with the patient today.  Concerns regarding medicines are outlined above.  No orders of the defined types were placed in this encounter.  Meds ordered this encounter  Medications   torsemide (DEMADEX) 20 MG tablet    Sig: Take 1 tablet (20 mg total) by mouth as needed (edema).    Dispense:  30 tablet    Refill:  0     History of Present Illness:    Don [name] is a 72 y.o. male with a hx of frequent PVCs right bundle branch block previous mild left ventricular dysfunction EF 45 to 50% CAD hypertension hyperlipidemia emphysema type 2 diabetes with diabetic polyneuropathy last seen 06/26/2023.  He was unimproved with antiarrhythmic drug therapy amiodarone and mexiletine and was felt not to be  a good candidate for EP ablation with COPD.  Amiodarone was discontinued because of increasing shortness of breath and hypoxia on home monitoring.  Another event monitor ordered by EP Dr. Elberta Fortis who is very high incidence of PVCs 33.6%.  The visit he had an echocardiogram reported 07/24/2023 left ventricle normal size wall thickness EF low normal 50 to 55% right ventricle normal size function and normal pulmonary artery pressure there was trace to small pericardial effusion anteriorly and no significant valvular abnormality.  Compliance with diet, lifestyle and medications: Yes  His wife and nurses present participates in the evaluation decision making He is very distressed about his PVC frequency now 33.6% He has marked exercise intolerance and feels weak but he is not edematous and he feels as if his COPD is stable he uses ambulatory oxygen at home but his sats today is 94% he is compliant with his bronchodilators In particular they do not want to take any further antiarrhythmic drugs They were quested referral to [facility] for second opinion for PVC focus ablation. In the interim I will stop his loop diuretic he has no edema continue spironolactone his wife is an Charity fundraiser I will give him a diuretic if needed. Past Medical History:  Diagnosis Date   Absence of bladder continence 01/08/2022   Acute cough 07/07/2023   Acute infection of nasal sinus 01/13/2023   Anxiety  state 02/02/2022   Arthritis    Asymptomatic LV dysfunction 10/18/2017   Atherosclerosis of aorta (HCC) 01/13/2023   Atypical chest pain 11/09/2022   B12 deficiency anemia 08/25/2021   Basal cell carcinoma    Benign prostatic hyperplasia with incomplete bladder emptying 01/14/2019   Bone pain 03/08/2020   Cancer (HCC)    throat - 1997, throat - 2018   Cardiomyopathy, secondary (HCC) 12/01/2016   Overview:  EF 47% 12/26/16   Chest pain syndrome 11/02/2017   Chronic coronary artery disease 10/18/2017   Chronic ischemic heart  disease 11/13/1998   Jan 22, 2003 Entered By: Aris Lot J Comment:  massive Mi in 2000 per patient hsitory, 2nd Mi in 2001Mar 11, 2004 Entered By: Aris Lot J Comment: Had stent placedin 2000,cath/angio in 2001   Chronic neck pain 05/16/2022   Chronic pain syndrome 06/04/2022   Chronic respiratory failure with hypoxia (HCC) 05/16/2022   CKD (chronic kidney disease) stage 3, GFR 30-59 ml/min (HCC) 07/25/2019   COPD (chronic obstructive pulmonary disease) (HCC)    COPD exacerbation (HCC) 07/30/2023   Coronary artery disease involving native coronary artery of native heart with angina pectoris (HCC) 12/01/2016   Overview:  He has hx of IWMI in remote past, last cath in 2012 at [facility] showed chronic total occlusion of previously stented RCA, with good collaterals, a 40% LAD stenosis, inferior hypokinesis and EF 45%.    He's been lost to Cardiology f/u since 2015, but has not had any recurrent events   Deficiency anemia 08/25/2021   Diabetic polyneuropathy associated with type 2 diabetes mellitus (HCC) 05/16/2022   Dyslipidemia 12/01/2016   Encounter for Medicare annual wellness exam 01/13/2023   Erectile dysfunction due to diseases classified elsewhere 06/12/2019   GERD (gastroesophageal reflux disease)    Hallucination 10/08/2022   Hyperlipidemia    Hypertension    Hypertensive heart disease with heart failure (HCC)    Insomnia 01/28/2021   Iron deficiency 07/07/2023   Iron deficiency anemia due to chronic blood loss 08/25/2021   Lesion of vocal cord    Leukoplakia of larynx 11/15/2016   Long-term use of aspirin therapy 10/02/2018   Lumbar back pain 06/04/2022   Lung nodules 08/17/2022   Malfunction of penile prosthesis (HCC) 01/14/2019   Malignant neoplasm of skin 01/28/2021   Formatting of this note might be different from the original. Jan 22, 2003 Entered By: Aris Lot J Comment: of skin - removed x2 inpast Jan 22, 2003 Entered By: Aris Lot J Comment: of skin - removed x2  inpast   Melanoma of back (HCC)    melanoma on back   Memory loss 03/08/2020   Moderate recurrent major depression (HCC) 03/02/2023   Mucopurulent chronic bronchitis (HCC) 04/08/2023   Other ill-defined and unknown causes of morbidity and mortality 11/13/1993   Formatting of this note might be different from the original. Jan 31, 2008 Entered By: ARMOUR,ROSS B Comment: lumbar, 8/08 Jan 22, 2003 Entered By: Aris Lot J Comment: x22yrs in work place  quit 21yrs ago in 2000 Jan 22, 2003 Entered By: Aris Lot J Comment: x53yrs in work place  quit 56yrs ago in 2000 Jan 31, 2008 Entered By: Bennie Pierini B Comment: lumbar, 8/08   PAD (peripheral artery disease) (HCC) 10/18/2017   Peripheral vascular disease (HCC)    iliac artery clot   Peyronie's disease 06/12/2019   Squamous cell carcinoma of larynx (HCC) 12/13/2016   Throat cancer (HCC)    Tobacco use disorder 06/12/2019   Type 2 diabetes mellitus with hyperglycemia, with  long-term current use of insulin (HCC) 07/30/2023   URI, acute 05/31/2023   Urinary urgency 04/08/2023   Ventricular ectopy 11/09/2022   Vertigo 04/08/2023   Vitamin D deficiency 01/13/2023    Current Medications: Current Meds  Medication Sig   ALPRAZolam (XANAX) 0.25 MG tablet TAKE 1 TABLET BY MOUTH ONCE DAILY AS NEEDED FOR ANXIETY   arformoterol (BROVANA) 15 MCG/2ML NEBU Take 2 mLs (15 mcg total) by nebulization 2 (two) times daily.   azithromycin (ZITHROMAX) 250 MG tablet Take 1 tablet (250 mg total) by mouth every Monday, Wednesday, and Friday.   budesonide (PULMICORT) 0.25 MG/2ML nebulizer solution Take 2 mLs (0.25 mg total) by nebulization in the morning and at bedtime.   clopidogrel (PLAVIX) 75 MG tablet Take 1 tablet by mouth once daily (Patient taking differently: Take 75 mg by mouth daily.)   cyanocobalamin (VITAMIN B12) 1000 MCG/ML injection INJECT 1 ML (CC) INTRAMUSCULARLY ONCE A WEEK (Patient taking differently: Inject 100 mcg into the muscle every 30  (thirty) days.)   cyclobenzaprine (FLEXERIL) 10 MG tablet TAKE  TABLET BY MOUTH THREE TIMES DAILY AS NEEDED FOR MUSCLE SPASM (Patient taking differently: Take 10 mg by mouth 3 (three) times daily as needed for muscle spasms.)   desvenlafaxine (PRISTIQ) 50 MG 24 hr tablet TAKE 1 TABLET BY MOUTH IN THE MORNING FOR 7 DAYS, THEN 2 TABLETS IN THE MORNING FOR 23 DAYS (Patient taking differently: Take 50 mg by mouth daily.)   Dupilumab (DUPIXENT) 300 MG/2ML SOPN Inject 300 mg into the skin every 14 (fourteen) days.   fenofibrate 160 MG tablet Take 1 tablet by mouth once daily   finasteride (PROSCAR) 5 MG tablet Take 1 tablet by mouth once daily (Patient taking differently: Take 5 mg by mouth daily.)   gabapentin (NEURONTIN) 400 MG capsule TAKE 1 CAPSULE BY MOUTH THREE TIMES DAILY   insulin lispro (HUMALOG) 100 UNIT/ML injection Inject 0.04 mLs (4 Units total) into the skin 3 (three) times daily with meals.   ipratropium-albuterol (DUONEB) 0.5-2.5 (3) MG/3ML SOLN USE 1 AMPULE IN NEBULIZER 4 TIMES DAILY (Patient taking differently: Take 3 mLs by nebulization every 6 (six) hours as needed (sob). USE 1 AMPULE IN NEBULIZER 4 TIMES DAILY)   Lancets (ONETOUCH DELICA PLUS LANCET33G) MISC USE 1  TO CHECK GLUCOSE THREE TIMES DAILY (Patient taking differently: 1 each by Other route 3 (three) times daily.)   levofloxacin (LEVAQUIN) 500 MG tablet Take 1 tablet (500 mg total) by mouth daily for 7 days.   levothyroxine (SYNTHROID) 25 MCG tablet Take 1 tablet by mouth once daily   losartan (COZAAR) 50 MG tablet Take 1 tablet (50 mg total) by mouth daily.   magnesium chloride (SLOW-MAG) 64 MG TBEC SR tablet Take 1 tablet (64 mg total) by mouth daily.   metFORMIN (GLUCOPHAGE) 1000 MG tablet TAKE 1 TABLET BY MOUTH TWICE DAILY WITH MEALS   MYRBETRIQ 25 MG TB24 tablet Take 1 tablet by mouth once daily (Patient taking differently: Take 25 mg by mouth daily.)   nitroGLYCERIN (NITROSTAT) 0.4 MG SL tablet DISSOLVE ONE TABLET UNDER  THE TONGUE EVERY 5 TO 10 MINUTES PRIOR TO ACTIVITIES WHICH MIGHT PRECIPITATE AN ATTACK (Patient taking differently: Place 0.4 mg under the tongue every 5 (five) minutes as needed for chest pain.)   nystatin cream (MYCOSTATIN) Apply 1 application topically 2 (two) times daily as needed for dry skin.   Omega-3 Fatty Acids (FISH OIL) 1000 MG CAPS Take 2 capsules (2,000 mg total) by mouth in the morning  and at bedtime.   omeprazole (PRILOSEC) 20 MG capsule Take 1 capsule by mouth once daily   ONETOUCH ULTRA TEST test strip USE 1 STRIP TO CHECK GLUCOSE IN THE MORNING AND 1 AT BEDTIME AS DIRECTED (Patient taking differently: 1 each by Other route as needed for other (GLucose check).)   oxyCODONE-acetaminophen (PERCOCET/ROXICET) 5-325 MG tablet Take 1 tablet by mouth every 6 (six) hours as needed for severe pain (pain score 7-10).   predniSONE (DELTASONE) 20 MG tablet Take 1 tablet (20 mg total) by mouth daily with breakfast for 7 days.   revefenacin (YUPELRI) 175 MCG/3ML nebulizer solution Take 3 mLs (175 mcg total) by nebulization daily.   rosuvastatin (CRESTOR) 40 MG tablet Take 1 tablet by mouth once daily (Patient taking differently: Take 40 mg by mouth daily.)   spironolactone (ALDACTONE) 25 MG tablet Take 1 tablet (25 mg total) by mouth daily.   tamsulosin (FLOMAX) 0.4 MG CAPS capsule TAKE 2 CAPSULES BY MOUTH ONCE DAILY AFTER SUPPER (Patient taking differently: Take 0.8 mg by mouth daily after supper.)   torsemide (DEMADEX) 20 MG tablet Take 1 tablet (20 mg total) by mouth as needed (edema).   TOUJEO SOLOSTAR 300 UNIT/ML Solostar Pen INJECT 30 UNITS SUBCUTANEOUSLY ONCE DAILY (Patient taking differently: Inject 30 Units into the skin daily. INJECT 30 UNITS SUBCUTANEOUSLY ONCE DAILY)   traZODone (DESYREL) 150 MG tablet Take 1 tablet (150 mg total) by mouth daily.   VENTOLIN HFA 108 (90 Base) MCG/ACT inhaler INHALE 2 PUFFS BY MOUTH EVERY 6 HOURS AS NEEDED FOR SHORTNESS OF BREATH FOR WHEEZING (Patient  taking differently: Inhale 2 puffs into the lungs every 6 (six) hours as needed for shortness of breath or wheezing.)   Vitamin D, Ergocalciferol, (DRISDOL) 1.25 MG (50000 UNIT) CAPS capsule TAKE 1 CAPSULE BY MOUTH TWICE A WEEK (Patient taking differently: Take 50,000 Units by mouth every 7 (seven) days.)   [DISCONTINUED] cefdinir (OMNICEF) 300 MG capsule Take 1 capsule (300 mg total) by mouth 2 (two) times daily.   [DISCONTINUED] torsemide (DEMADEX) 20 MG tablet Take 1 tablet (20 mg total) by mouth daily.      EKGs/Labs/Other Studies Reviewed:    The following studies were reviewed today:  Cardiac Studies & Procedures     STRESS TESTS  MYOCARDIAL PERFUSION IMAGING 05/24/2022  Interpretation Summary   The study is normal. The study is intermediate risk.   LV perfusion is abnormal. There is no evidence of ischemia. There is evidence of infarction. Defect 1: There is a medium defect with moderate reduction in uptake present in the inferior location(s) that is fixed. There is abnormal wall motion in the defect area. Consistent with infarction.   Left ventricular function is abnormal. Global function is mildly reduced. Nuclear stress EF: 49 %. The left ventricular ejection fraction is mildly decreased (45-54%). End diastolic cavity size is normal.   Prior study not available for comparison.   ECHOCARDIOGRAM  ECHOCARDIOGRAM COMPLETE 07/24/2023  Narrative ECHOCARDIOGRAM REPORT    Patient Name:   CASSIDY TABET Date of Exam: 07/24/2023 Medical Rec #:  [id number]          Height:       70.0 in Accession #:    [id number]         Weight:       197.0 lb Date of Birth:  [birth year]          BSA:          2.074 m Patient Age:  71 years           BP:           124/56 mmHg Patient Gender: M                  HR:           72 bpm. Exam Location:  [facility]  Procedure: 2D Echo, Cardiac Doppler, Color Doppler and Intracardiac Opacification Agent  Indications:    Frequent PVCs [I49.3  (ICD-10-CM)]; RBBB (right bundle branch block with left anterior fascicular block) [I45.2 (ICD-10-CM)]; Coronary artery disease involving native coronary artery of native heart without angina pectoris [I25.10 (ICD-10-CM)]; Essential hypertension [I10 (ICD-10-CM)]; Mixed hyperlipidemia [E78.2 (ICD-10-CM)]  History:        Patient has prior history of Echocardiogram examinations, most recent 07/25/2022.  Sonographer:    Louie Boston RDCS Referring Phys: [id number] Baldo Daub   Sonographer Comments: Technically challenging study due to limited acoustic windows. Global longitudinal strain was attempted. IMPRESSIONS   1. Left ventricular ejection fraction, by estimation, is 50 to 55%. The left ventricle has low normal function. The left ventricle demonstrates global hypokinesis. Left ventricular diastolic parameters are indeterminate. 2. Right ventricular systolic function is normal. The right ventricular size is normal. There is normal pulmonary artery systolic pressure. 3. Trace to small pericardial effusion anteriorlly. There is no evidence of cardiac tamponade. 4. The mitral valve was not well visualized. Trivial mitral valve regurgitation. No evidence of mitral stenosis. 5. The aortic valve is tricuspid. Aortic valve regurgitation is not visualized. No aortic stenosis is present. 6. There is borderline dilatation of the ascending aorta, measuring 36 mm. 7. The inferior vena cava is normal in size with greater than 50% respiratory variability, suggesting right atrial pressure of 3 mmHg.  Comparison(s): A prior study was performed on 06/03/2021. LVEF was 55-60% and small pericardial effusion and fat pad reported.  FINDINGS Left Ventricle: Left ventricular ejection fraction, by estimation, is 50 to 55%. The left ventricle has low normal function. The left ventricle demonstrates global hypokinesis. Definity contrast agent was given IV to delineate the left ventricular endocardial borders. The  left ventricular internal cavity size was normal in size. There is no left ventricular hypertrophy. Left ventricular diastolic parameters are indeterminate.  Right Ventricle: The right ventricular size is normal. Right vetricular wall thickness was not well visualized. Right ventricular systolic function is normal. There is normal pulmonary artery systolic pressure. The tricuspid regurgitant velocity is 2.41 m/s, and with an assumed right atrial pressure of 3 mmHg, the estimated right ventricular systolic pressure is 26.2 mmHg.  Left Atrium: Left atrial size was not well visualized.  Right Atrium: Right atrial size was not well visualized.  Pericardium: Trivial pericardial effusion is present. The pericardial effusion is localized near the right atrium and anterior to the right ventricle. There is no evidence of cardiac tamponade. Presence of epicardial fat layer.  Mitral Valve: The mitral valve was not well visualized. Trivial mitral valve regurgitation. No evidence of mitral valve stenosis.  Tricuspid Valve: The tricuspid valve is not well visualized. Tricuspid valve regurgitation is trivial.  Aortic Valve: The aortic valve is tricuspid. Aortic valve regurgitation is not visualized. No aortic stenosis is present.  Pulmonic Valve: The pulmonic valve was not well visualized. Pulmonic valve regurgitation is not visualized.  Aorta: The aortic root is normal in size and structure. There is borderline dilatation of the ascending aorta, measuring 36 mm.  Venous: The inferior vena cava is normal in size with  greater than 50% respiratory variability, suggesting right atrial pressure of 3 mmHg.  IAS/Shunts: The interatrial septum was not well visualized.   LEFT VENTRICLE PLAX 2D LVIDd:         5.70 cm     Diastology LVIDs:         3.80 cm     LV e' medial:    5.55 cm/s LV PW:         1.10 cm     LV E/e' medial:  9.1 LV IVS:        1.10 cm     LV e' lateral:   6.64 cm/s LVOT diam:     2.10 cm      LV E/e' lateral: 7.6 LV SV:         64 LV SV Index:   31 LVOT Area:     3.46 cm  LV Volumes (MOD) LV vol d, MOD A2C: 74.9 ml LV vol d, MOD A4C: 89.2 ml LV vol s, MOD A2C: 40.0 ml LV vol s, MOD A4C: 41.8 ml LV SV MOD A2C:     34.9 ml LV SV MOD A4C:     89.2 ml LV SV MOD BP:      42.8 ml  RIGHT VENTRICLE            IVC RV Basal diam:  2.60 cm    IVC diam: 1.60 cm RV S prime:     9.90 cm/s TAPSE (M-mode): 1.6 cm  LEFT ATRIUM             Index        RIGHT ATRIUM           Index LA diam:        3.70 cm 1.78 cm/m   RA Area:     12.40 cm LA Vol (A2C):   52.8 ml 25.46 ml/m  RA Volume:   30.00 ml  14.47 ml/m LA Vol (A4C):   49.3 ml 23.77 ml/m LA Biplane Vol: 51.4 ml 24.78 ml/m AORTIC VALVE LVOT Vmax:   85.95 cm/s LVOT Vmean:  58.450 cm/s LVOT VTI:    0.184 m  AORTA Ao Root diam: 3.60 cm Ao Asc diam:  3.60 cm Ao Desc diam: 2.90 cm  MV E velocity: 50.30 cm/s   TRICUSPID VALVE MV A velocity: 105.00 cm/s  TR Peak grad:   23.2 mmHg MV E/A ratio:  0.48         TR Vmax:        241.00 cm/s  SHUNTS Systemic VTI:  0.18 m Systemic Diam: 2.10 cm  Sreedhar reddy Madireddy Electronically signed by Vern Claude reddy Madireddy Signature Date/Time: 07/24/2023/5:30:35 PM    Final    MONITORS  LONG TERM MONITOR (3-14 DAYS) 07/18/2023  Narrative Patch Wear Time:  2 days and 10 hours  Patient had a min HR of 63 bpm, max HR of 86 bpm, and avg HR of 72 bpm. Predominant underlying rhythm was Sinus Rhythm. 33.6% ventricular ectopy Less than 1% supraventricular ectopy Patient triggered episodes associated with sinus rhythm and PVCs  Will Camnitz, MD               Recent Labs: 11/09/2022: B Natriuretic Peptide 216.5 05/31/2023: Magnesium 1.6; TSH 2.200 07/06/2023: ALT 13; BUN 16; Creatinine, Ser 1.12; Hemoglobin 11.9; NT-Pro BNP 665; Platelets 214; Potassium 4.3; Sodium 144  Recent Lipid Panel    Component Value Date/Time   CHOL 119 07/06/2023 1147   TRIG 297 (H)  07/06/2023  1147   HDL 27 (L) 07/06/2023 1147   CHOLHDL 4.4 07/06/2023 1147   LDLCALC 46 07/06/2023 1147    Physical Exam:    VS:  BP (!) 100/50   Pulse 77   Ht 5\' 10"  (1.778 m)   Wt 193 lb (87.5 kg)   SpO2 94%   BMI 27.69 kg/m     Wt Readings from Last 3 Encounters:  09/14/23 193 lb (87.5 kg)  07/30/23 192 lb (87.1 kg)  07/06/23 197 lb (89.4 kg)     GEN: Tearful well nourished, well developed in no acute distress HEENT: Normal NECK: No JVD; No carotid bruits LYMPHATICS: No lymphadenopathy CARDIAC: Frequent extrasystoles RRR, no murmurs, rubs, gallops RESPIRATORY:  Clear to auscultation without rales, wheezing or rhonchi  ABDOMEN: Soft, non-tender, non-distended MUSCULOSKELETAL:  No edema; No deformity  SKIN: Warm and dry NEUROLOGIC:  Alert and oriented x 3 PSYCHIATRIC:  Normal affect    Signed, Norman Herrlich, MD  09/14/2023 5:11 PM    [facility]

## 2023-09-14 ENCOUNTER — Encounter: Payer: Self-pay | Admitting: Cardiology

## 2023-09-14 ENCOUNTER — Ambulatory Visit: Payer: Medicare Other | Attending: Cardiology | Admitting: Cardiology

## 2023-09-14 ENCOUNTER — Encounter: Payer: Self-pay | Admitting: Family Medicine

## 2023-09-14 VITALS — BP 100/50 | HR 77 | Ht 70.0 in | Wt 193.0 lb

## 2023-09-14 DIAGNOSIS — I2510 Atherosclerotic heart disease of native coronary artery without angina pectoris: Secondary | ICD-10-CM

## 2023-09-14 DIAGNOSIS — I493 Ventricular premature depolarization: Secondary | ICD-10-CM

## 2023-09-14 DIAGNOSIS — I119 Hypertensive heart disease without heart failure: Secondary | ICD-10-CM | POA: Diagnosis not present

## 2023-09-14 DIAGNOSIS — E782 Mixed hyperlipidemia: Secondary | ICD-10-CM

## 2023-09-14 DIAGNOSIS — I452 Bifascicular block: Secondary | ICD-10-CM | POA: Diagnosis not present

## 2023-09-14 MED ORDER — TORSEMIDE 20 MG PO TABS: 20.0000 mg | ORAL_TABLET | ORAL | 0 refills | Status: DC | PRN

## 2023-09-14 NOTE — Patient Instructions (Signed)
Medication Instructions:  Your physician has recommended you make the following change in your medication:   START: Torsemide 20 as needed for edema  *If you need a refill on your cardiac medications before your next appointment, please call your pharmacy*   Lab Work: None If you have labs (blood work) drawn today and your tests are completely normal, you will receive your results only by: MyChart Message (if you have MyChart) OR A paper copy in the mail If you have any lab test that is abnormal or we need to change your treatment, we will call you to review the results.   Testing/Procedures: None   Follow-Up: At [facility], you and your health needs are our priority.  As part of our continuing mission to provide you with exceptional heart care, we have created designated Provider Care Teams.  These Care Teams include your primary Cardiologist (physician) and Advanced Practice Providers (APPs -  Physician Assistants and Nurse Practitioners) who all work together to provide you with the care you need, when you need it.  We recommend signing up for the patient portal called "MyChart".  Sign up information is provided on this After Visit Summary.  MyChart is used to connect with patients for Virtual Visits (Telemedicine).  Patients are able to view lab/test results, encounter notes, upcoming appointments, etc.  Non-urgent messages can be sent to your provider as well.   To learn more about what you can do with MyChart, go to [email or web address].    Your next appointment:   3 month(s)  Provider:   Norman Herrlich, MD    Other Instructions None

## 2023-09-17 ENCOUNTER — Other Ambulatory Visit: Payer: Self-pay | Admitting: Family Medicine

## 2023-09-17 DIAGNOSIS — J9610 Chronic respiratory failure, unspecified whether with hypoxia or hypercapnia: Secondary | ICD-10-CM | POA: Diagnosis not present

## 2023-09-17 DIAGNOSIS — J449 Chronic obstructive pulmonary disease, unspecified: Secondary | ICD-10-CM | POA: Diagnosis not present

## 2023-09-17 MED ORDER — DULOXETINE HCL 60 MG PO CPEP: 60.0000 mg | ORAL_CAPSULE | Freq: Two times a day (BID) | ORAL | 3 refills | Status: DC

## 2023-09-17 NOTE — Progress Notes (Unsigned)
IOV 04/05/2018  Chief Complaint  Patient presents with   Consult    Referred by Dr. Blane Ohara due to COPD. Pt was diagnosed in 2017 and states symptoms have become worse. Pt does have c/o SOB with exertion and also cough first thing in morning.    Don [name] 72 y.o. male from [address] [place] [address] -presents for new evaluation.  His wife and that is a good historian and gives most of the history.  History is gained from him and review of the old chart.  I also visualized the CT scan images from April 2019 and all the way back through April 2017 personally and agree with the final report  He is an ongoing smoker with a previous history of throat cancer and also chronic systolic heart failure status post cardiac stent for many many years with a stable ejection fraction of 35% being followed at [facility].  For the last 3 years he has had worsening shortness of breath.  Initially given a diagnosis of COPD not otherwise specified.  Wife says that he has had progressive shortness of breath since then.  His maintenance inhaler the Spiriva but he is also on Pulmicort nebulizers twice daily which is not mentioned in his med list.  For the last 3 years except for the year 2018 he has had admissions once a year for COPD exacerbation.  He also goes multiple rounds of prednisone and antibiotics for outpatient COPD exacerbation.  Most recent prednisone antibiotic was in December 2018.  They are frustrated with the repeated flareups.  Shortness of breath is associate with cough and wheezing.  This class III in exertion severity.  Walking from the bedroom to the porch makes him short of breath and relieved by rest there is no associated chest pain.  Also this past year he has had increasing unsteadiness of his lower extremities which is attributed to back issues and apparently he has been to neurology and spine surgery and a spine surgeon is having a plan for undergoing to our spinal surgery  and  [facility].  They are asking about preoperative respiratory assessment as well.     Walking desaturation test today.  We normally 285 feet x 3 laps on room air: He only did one lap.  He needed 2 people to walk with him.  He was extremely unsteady on his lower extremities.  He did not desaturate he just became tachycardic.  Pulse ox was held up in the high 90s   Chronic obstructive pulmonary disease, unspecified COPD type (HCC)  - copd is stable at this point - continue o2 at night - continue spiriva daily - I recommend adding breo to the regimen scheduled daily - recommend using duoneb as needed  - recommend holding off pulmicort - recommend you talk to cadiologist and change lisinopril to another class; and see if cough improved - later at followu[p can discuss role of roflumilast in preventing flareups - ideally you also need pulmonary rehab but right now wobbly legs need to be addressed first  Preoperative respiratory examination - Moderate risk for any pulmonary complication such as pneumonia following surgery - Low moderate risk for prolonged ventilator dependence  - At time of surgery ideal is that he is flare up free for a month and has quit smoking for a month - this helps reduce risk further  Physical deconditioning  - this is a major issue more than copd causing you to  be fatigued and winded - only sorting out the back can improve this  Multiple lung nodules on CT - stable April 2017 - April 2019 - followup per throat cancer doctor I suppose   Followup  - 6-8 weeks to see response   OV 08/12/2018     HPI Don [name] 72 y.o. -presents for COPD follow-up.  Presents with his wife.  Since his last visit he has had shoulder and spine surgery successfully.  He continues to smoke however.  In the last 1 month wife is reporting deterioration in cough wheezing and dyspnea and chest tightness.  Sputum is changed from white in color to yellow-brown.   She is very frustrated by the deterioration.  She agrees that lisinopril can be contributing to cough because she herself suffered from that.  Nevertheless she is frustrated extremely.  She is asking if any medicines can prevent COPD exacerbations.  Review of the chart indicates April 2019 he did have CT scan of the chest which showed chronic stable nodules.  He has throat cancer.  He has some occasional dysphagia.  The wife is wondering if it is because of thick mucus.  Apparently his esophagus has been stretch.  ENT appointment is pending today.  CAT score is worse at 27.  He continues to smoke.   08/17/2022: OV with [name],NP for follow-up visit.  Last seen November 2020.  Underlying COPD.  Currently on Breo and Spiriva.  Having difficulty using his inhalers.  Does have some memory impairment.  Also feels like he cannot get enough of the medicine out of the inhalers.  Just feels like his breathing is gotten slowly worse over the last couple years.  Gets short of breath with minimal activities.  Lives a relatively sedentary lifestyle.  Does continue to smoke.  Discussed smoking cessation.  He is on 2 L of oxygen at bedtime.  Spirometry in the office showed FEV1 of 31% and ratio 42.  Formal PFTs ordered for further evaluation.  Changed to triple therapy nebulizer regimen to see if we can have better control over his symptoms.  Pulmonary nodules identified on previous CT scan.  Plan for repeat-ordered at visit.  11/02/2022: Today-follow-up Patient presents today for follow-up after undergoing pulmonary function testing.  He has severe obstructive defect with reversibility and severe diffusion defect.  He has been doing a little bit better with triple therapy regimen.  Feels like he is actually getting the medicine and it opens up his chest little bit.  Continues to have daily symptoms of dyspnea, which limit his activities.  He also continues to have daily symptoms of productive cough with white to brown  sputum.  This is baseline for him.  He has had multiple exacerbations over the past year requiring antibiotics and steroids.  His wife wants to know if we can put him on something to help prevent future flareups.  No increased chest congestion or wheezing.  No hemoptysis, weight loss or anorexia.  CT scan results are not available yet.  He does continue to smoke daily; down to 4 cigarettes a day.  He has tried multiple different things in the past to help him quit without any benefit.  OV 01/04/2023  Subjective:  Patient ID: Don [name], male , DOB: 07-Feb-1951 , age 30 y.o. , MRN: [id number] , ADDRESS: [address] [place] [address] PCP Blane Ohara, MD Patient Care Team: Blane Ohara, MD as PCP - General (Family Medicine) Revankar, Aundra Dubin, MD as PCP -  Cardiology (Cardiology) Baldo Daub, MD as Referring Physician (Cardiology) Suzanna Obey, MD as Consulting Physician (Otolaryngology) Zettie Pho, [facility] as Pharmacist (Pharmacist)  This Provider for this visit: Treatment Team:  Attending Provider: Kalman Shan, MD    01/04/2023 -   Chief Complaint  Patient presents with   Follow-up    Patient states he can't breathe. Patient coughing up yellow and white phlegm.     HPI Don [name] 72 y.o. -returns for follow-up.  I personally not seen him in a few years.  He has been seeing nurse practitioner multiple times in 2023.  According to him and his wife.  Wife is acting as an independent historian he had multiple exacerbations at least 7 or 9 in 2023 is requiring prednisone and antibiotics.  They insist that Levaquin and prednisone each for 7 days is the only thing that works for him.  They will not accept any other antibiotic therapy.  For this routine visit there endorsing that he is feeling worse for the last 2 days they feel there is a flareup.  They feel there is an antibiotic and prednisone indicated.  They specifically want Levaquin and prednisone.  Wife also  believes that he might be aspirating and this might be a risk factor.  Review of the records indicate that he had high blood eosinophils in the past.  In the past he has been on Daliresp to prevent exacerbations this apparently worked well but it is expensive.  He never been on azithromycin.  Never been on Dupixent.    CT Chest data 10/31/22  Narrative & Impression  CLINICAL DATA:  COPD, dyspnea on exertion. History of throat cancer and diabetes.   EXAM: CT CHEST WITHOUT CONTRAST   TECHNIQUE: Multidetector CT imaging of the chest was performed following the standard protocol without IV contrast.   RADIATION DOSE REDUCTION: This exam was performed according to the departmental dose-optimization program which includes automated exposure control, adjustment of the mA and/or kV according to patient size and/or use of iterative reconstruction technique.   COMPARISON:  CT 04/30/2020.  Radiographs 08/17/2022.   FINDINGS: Cardiovascular: Atherosclerosis of the aorta, great vessels and coronary arteries. No acute vascular findings are seen on noncontrast imaging. The heart size is normal. There is stable anterior pericardial thickening versus a small amount of pericardial fluid.   Mediastinum/Nodes: There are no enlarged mediastinal, hilar or axillary lymph nodes.Hilar assessment is limited by the lack of intravenous contrast, although the hilar contours appear unchanged. The thyroid gland, trachea and esophagus demonstrate no significant findings.   Lungs/Pleura: No pleural effusion or pneumothorax. Moderate centrilobular and paraseptal emphysema with stable biapical scarring. Multiple predominately right-sided pulmonary nodules are again noted, including and densely calcified right lower lobe granuloma. The new right upper lobe nodule seen on the most recent CT is stable over the interval, measuring 4 mm on image 23/5. There is new mild clustered nodularity in the left lower  lobe on axial images 70 through 76, likely postinflammatory or mucous impacted terminal bronchioles. No airspace disease.   Upper abdomen: No significant findings are seen within the visualized upper abdomen. There are scattered splenic granulomas. Mildly prominent ingested material in the stomach. Previous cholecystectomy.   Musculoskeletal/Chest wall: There is no chest wall mass or suspicious osseous finding. Mild spondylosis post lower cervical fusion.   IMPRESSION: 1. No acute chest findings identified. 2. New mild clustered nodularity in the left lower lobe, likely postinflammatory or mucous impacted terminal bronchioles. No airspace disease or  suspicious nodularity. 3. Additional scattered small pulmonary nodules are unchanged from priors, consistent with a benign etiology. 4. Sequela of prior granulomatous disease with calcified right lower lobe granuloma and scattered splenic granulomas. 5. Aortic Atherosclerosis (ICD10-I70.0) and Emphysema (ICD10-J43.9).     Electronically Signed   By: Carey Bullocks M.D.   On: 11/03/2022 16:58        Latest Reference Range & Units 12/16/20 09:13 11/02/21 14:00 03/14/22 09:43 05/19/22 08:23 09/29/22 12:39  EOS (ABSOLUTE) 0.0 - 0.4 x10E3/uL 0.3 0.3 0.3 0.3 0.2       OV 02/15/2023  Subjective:  Patient ID: Don [name], male , DOB: Jan 25, 1951 , age 65 y.o. , MRN: [id number] , ADDRESS: [address] [place] [address] PCP Blane Ohara, MD Patient Care Team: Blane Ohara, MD as PCP - General (Family Medicine) Revankar, Aundra Dubin, MD as PCP - Cardiology (Cardiology) Regan Lemming, MD as PCP - Electrophysiology (Cardiology) Baldo Daub, MD as Referring Physician (Cardiology) Suzanna Obey, MD as Consulting Physician (Otolaryngology) Zettie Pho, [facility] as Pharmacist (Pharmacist)  This Provider for this visit: Treatment Team:  Attending Provider: Julio Sicks, NP    02/15/2023 -   Chief Complaint   Patient presents with   Follow-up    Thick phlegm, wife wants Dr to hear lungs.       HPI Don [name] 72 y.o. -here to review results and also update on progress.  Since last seeing me including the last visit he has had 4 rounds of antibiotics and prednisone of which 2 was Levaquin 1 with Bactrim and 1 with Keflex please.  Most recent antibiotic completion was yesterday most recent prednisone completion was yesterday.  The wife is frustrated and he is also frustrated.  None of these treatment rounds are helping.  The phlegm is very thick.  He has to cough to bring the phlegm out.  Vibratory vest helped him.  He chokes because of the thick phlegm.  Most of the time it is white but sometimes it is green-yellow brown.  They try to give sputum sample but I believe the sputum was not sent because there was no order.  She is asking for a chest x-ray at this point in time.  December 2023 CT scan of the chest did not show any lung cancer.  She did have CT sinus March 2024 at [place].  I do not have the images but it shows pansinusitis.  Wife is able to get ENT appointment mid April 2024 at [facility] in [place].  He has a history of throat cancer and has been seen by ENT in the past.  He continues to have hoarse voice.  He also has elevated eosinophils we prescribed Dupixent but this is yet to start.  Because of the high co-pay to try to get it through charity.  Had    CT SINUS MARCH 2024    IMPRESSION: Chronic frontal, ethmoid, sphenoid and a maxillary sinusitis. There are no air-fluid levels. Postsurgical changes are noted in maxillary and ethmoid sinuses.  Cortical sulci in the brain appear prominent suggesting atrophy. Scattered arterial calcifications are seen.   Electronically Signed By: Ernie Avena M.D. On: 02/01/2023 09:48      Latest Reference Range & Units 08/17/22 10:15 01/04/23 12:08  Eosinophils Absolute 0.0 - 0.7 K/uL 0.3 0.3    SEPT 2024 Patient presents  today for acute video visit.  Accompanied by his wife.  Patient has severe COPD with recurrent respiratory infections requiring antibiotics  and prednisone, especially in the winter.  He currently has a cough with yellow mucus.  Last course of prednisone was 1 to 2 weeks ago. He is maintained on Pulmicort, Brovana, Yupelri and Dupixent.  He wears 2L oxygen at night.  Patient and wife are inquiring about starting a daily antibiotic to prevent flareups, they have previously discussed this with Dr. Marchelle Gearing.    Severe COPD with recurrent exacerbations - Starting Azithromycin MWF to decrease flare ups - Sending in prednisone taper for acute symptoms - Continue Pulmicort, Brovana, Yupelri and Dupixent, if no improvement at follow-up may need to consider daily low-dose prednisone and/or starting PDE inhibitor.  Chronic respiratory failure - Continue 2L oxygen at bedtime   Follow Up Instructions:  4-6 weeks with EKG     OV 09/18/2023  Subjective:  Patient ID: Don [name], male , DOB: 1951-10-20 , age 72 y.o. , MRN: [id number] , ADDRESS: [address] [place] [address] PCP Blane Ohara, MD Patient Care Team: Blane Ohara, MD as PCP - General (Family Medicine) Baldo Daub, MD as Referring Physician (Cardiology) Suzanna Obey, MD as Consulting Physician (Otolaryngology) Zettie Pho, [facility] (Inactive) as Pharmacist (Pharmacist)  This Provider for this visit: Treatment Team:  Attending Provider: Kalman Shan, MD  #Gold stage III COPD with nocturnal oxygen use #COPD frequent exacerbated. # Chronic systolic dysfunction December 2023 EF 45% # Ongoing 50 pack smoker # History of throat cancer # Chronic sinusitis seen on CT scan of the sinus March [facility]24 at [facility] # Mild chronic elevations in blood eosinophil counts  09/18/2023 -   Chief Complaint  Patient presents with   Acute Visit    Pt states his breathing is off. Pt daughter states she think he is best on steroids.       HPI Don [name] 72 y.o. -presents with his wife and had stridor.  She is the main historian.  She tells me that he is now on Dupixent may be exacerbations of a little bit less.  Best Clent [place] for tomorrow azithromycin prophylaxis but this has not helped.  They want to stop it.  He is frustrated by his back pain.  She says that every time she takes prednisone he feels better and then as the prednisone comes off a few days later he will have a flareup again.  Currently when she made the acute visit he was having an exacerbation but he is off prednisone and is currently well but she worries in few days he will flareup.  I have agreed to give him a prednisone pack.  Did discuss new medication for COPD that improves lung function and prevent flareups.  Send nebulizer OThyuvayre it does help FEV1 improvement relatively at least 200 cc over 6 months.  There is block box warning for increased psychiatric disturbance but they are okay with this risk.  Did indicate to them if this keeps happening then we will do steroids  Other issues - Working with cardiology and cardiac issues his BNP was recently high - Continue smoking: Continues to smoke.  He is struggling to quit.  Offered referral to [facility] quit smoking cessation clinic and he is agreeable to do that. -Echocardiogram September 2020 for EF 55%.    PFT     Latest Ref Rng & Units 10/26/2022   10:46 AM  PFT Results  FVC-Pre L 2.32   FVC-Predicted Pre % 58   FVC-Post L 2.84   FVC-Predicted Post % 71   Pre FEV1/FVC % %  48   Post FEV1/FCV % % 44   FEV1-Pre L 1.11   FEV1-Predicted Pre % 38   FEV1-Post L 1.24   DLCO uncorrected ml/min/mmHg 9.16   DLCO UNC% % 38   DLCO corrected ml/min/mmHg 9.59   DLCO COR %Predicted % 40   DLVA Predicted % 48       LAB RESULTS last 96 hours No results found.  LAB RESULTS last 90 days Recent Results (from the past 2160 hour(s))  Basic Metabolic Panel (BMET)     Status: Abnormal   Collection Time:  06/26/23 12:10 PM  Result Value Ref Range   Glucose 349 (H) 70 - 99 mg/dL   BUN 18 8 - 27 mg/dL   Creatinine, Ser 4.09 0.76 - 1.27 mg/dL   eGFR 61 >81 XB/JYN/8.29   BUN/Creatinine Ratio 15 10 - 24   Sodium 137 134 - 144 mmol/L   Potassium 4.4 3.5 - 5.2 mmol/L   Chloride 96 96 - 106 mmol/L   CO2 24 20 - 29 mmol/L   Calcium 8.8 8.6 - 10.2 mg/dL  CBC     Status: Abnormal   Collection Time: 06/26/23 12:10 PM  Result Value Ref Range   WBC 18.4 (H) 3.4 - 10.8 x10E3/uL   RBC 4.88 4.14 - 5.80 x10E6/uL   Hemoglobin 12.2 (L) 13.0 - 17.7 g/dL   Hematocrit 56.2 13.0 - 51.0 %   MCV 81 79 - 97 fL   MCH 25.0 (L) 26.6 - 33.0 pg   MCHC 31.0 (L) 31.5 - 35.7 g/dL   RDW 86.5 (H) 78.4 - 69.6 %   Platelets 236 150 - 450 x10E3/uL  Pro b natriuretic peptide (BNP)     Status: Abnormal   Collection Time: 06/26/23 12:10 PM  Result Value Ref Range   NT-Pro BNP 1,629 (H) 0 - 376 pg/mL    Comment: The following cut-points have been suggested for the use of proBNP for the diagnostic evaluation of heart failure (HF) in patients with acute dyspnea: Modality                     Age           Optimal Cut                            (years)            Point ------------------------------------------------------ Diagnosis (rule in HF)        <50            450 pg/mL                           50 - 75            900 pg/mL                               >75           1800 pg/mL Exclusion (rule out HF)  Age independent     300 pg/mL   POC COVID-19     Status: None   Collection Time: 07/06/23 11:03 AM  Result Value Ref Range   SARS Coronavirus 2 Ag Negative Negative  Comprehensive metabolic panel     Status: Abnormal   Collection Time: 07/06/23 11:47 AM  Result Value Ref Range   Glucose 155 (H)  70 - 99 mg/dL   BUN 16 8 - 27 mg/dL   Creatinine, Ser 3.24 0.76 - 1.27 mg/dL   eGFR 69 >40 NU/UVO/5.36   BUN/Creatinine Ratio 14 10 - 24   Sodium 144 134 - 144 mmol/L   Potassium 4.3 3.5 - 5.2 mmol/L   Chloride 104  96 - 106 mmol/L   CO2 26 20 - 29 mmol/L   Calcium 9.0 8.6 - 10.2 mg/dL   Total Protein 6.5 6.0 - 8.5 g/dL   Albumin 4.1 3.8 - 4.8 g/dL   Globulin, Total 2.4 1.5 - 4.5 g/dL   Bilirubin Total 0.3 0.0 - 1.2 mg/dL   Alkaline Phosphatase 64 44 - 121 IU/L   AST 11 0 - 40 IU/L   ALT 13 0 - 44 IU/L  Pro b natriuretic peptide (BNP)     Status: Abnormal   Collection Time: 07/06/23 11:47 AM  Result Value Ref Range   NT-Pro BNP 665 (H) 0 - 376 pg/mL    Comment: The following cut-points have been suggested for the use of proBNP for the diagnostic evaluation of heart failure (HF) in patients with acute dyspnea: Modality                     Age           Optimal Cut                            (years)            Point ------------------------------------------------------ Diagnosis (rule in HF)        <50            450 pg/mL                           50 - 75            900 pg/mL                               >75           1800 pg/mL Exclusion (rule out HF)  Age independent     300 pg/mL   CBC with Differential/Platelet     Status: Abnormal   Collection Time: 07/06/23 11:47 AM  Result Value Ref Range   WBC 11.5 (H) 3.4 - 10.8 x10E3/uL   RBC 4.86 4.14 - 5.80 x10E6/uL   Hemoglobin 11.9 (L) 13.0 - 17.7 g/dL   Hematocrit 64.4 03.4 - 51.0 %   MCV 80 79 - 97 fL   MCH 24.5 (L) 26.6 - 33.0 pg   MCHC 30.7 (L) 31.5 - 35.7 g/dL   RDW 74.2 (H) 59.5 - 63.8 %   Platelets 214 150 - 450 x10E3/uL   Neutrophils 58 Not Estab. %   Lymphs 31 Not Estab. %   Monocytes 8 Not Estab. %   Eos 2 Not Estab. %   Basos 1 Not Estab. %   Neutrophils Absolute 6.7 1.4 - 7.0 x10E3/uL   Lymphocytes Absolute 3.6 (H) 0.7 - 3.1 x10E3/uL   Monocytes Absolute 0.9 0.1 - 0.9 x10E3/uL   EOS (ABSOLUTE) 0.2 0.0 - 0.4 x10E3/uL   Basophils Absolute 0.1 0.0 - 0.2 x10E3/uL   Immature Granulocytes 0 Not Estab. %   Immature Grans (Abs) 0.0 0.0 - 0.1 x10E3/uL  Hemoglobin A1c  Status: Abnormal   Collection Time: 07/06/23 11:47 AM   Result Value Ref Range   Hgb A1c MFr Bld 8.1 (H) 4.8 - 5.6 %    Comment:          Prediabetes: 5.7 - 6.4          Diabetes: >6.4          Glycemic control for adults with diabetes: <7.0    Est. average glucose Bld gHb Est-mCnc 186 mg/dL  Lipid panel     Status: Abnormal   Collection Time: 07/06/23 11:47 AM  Result Value Ref Range   Cholesterol, Total 119 100 - 199 mg/dL   Triglycerides 846 (H) 0 - 149 mg/dL   HDL 27 (L) >96 mg/dL   VLDL Cholesterol Cal 46 (H) 5 - 40 mg/dL   LDL Chol Calc (NIH) 46 0 - 99 mg/dL   Chol/HDL Ratio 4.4 0.0 - 5.0 ratio    Comment:                                   T. Chol/HDL Ratio                                             Men  Women                               1/2 Avg.Risk  3.4    3.3                                   Avg.Risk  5.0    4.4                                2X Avg.Risk  9.6    7.1                                3X Avg.Risk 23.4   11.0   Vitamin B12     Status: None   Collection Time: 07/06/23 11:47 AM  Result Value Ref Range   Vitamin B-12 349 232 - 1,245 pg/mL  Methylmalonic acid, serum     Status: Abnormal   Collection Time: 07/06/23 11:47 AM  Result Value Ref Range   Methylmalonic Acid 409 (H) 0 - 378 nmol/L  Iron, TIBC and Ferritin Panel     Status: Abnormal   Collection Time: 07/06/23 11:47 AM  Result Value Ref Range   Total Iron Binding Capacity 382 250 - 450 ug/dL   UIBC 295 284 - 132 ug/dL   Iron 54 38 - 440 ug/dL   Iron Saturation 14 (L) 15 - 55 %   Ferritin 16 (L) 30 - 400 ng/mL  ECHOCARDIOGRAM COMPLETE     Status: None   Collection Time: 07/24/23  3:44 PM  Result Value Ref Range   S' Lateral 3.80 cm   Single Plane A4C EF 53.1 %   Single Plane A2C EF 46.6 %   Calc EF 50.9 %   Est EF 50 - 55%  has a past medical history of Absence of bladder continence (01/08/2022), Acute cough (07/07/2023), Acute infection of nasal sinus (01/13/2023), Anxiety state (02/02/2022), Arthritis, Asymptomatic LV dysfunction  (10/18/2017), Atherosclerosis of aorta (HCC) (01/13/2023), Atypical chest pain (11/09/2022), B12 deficiency anemia (08/25/2021), Basal cell carcinoma, Benign prostatic hyperplasia with incomplete bladder emptying (01/14/2019), Bone pain (03/08/2020), Cancer (HCC), Cardiomyopathy, secondary (HCC) (12/01/2016), Chest pain syndrome (11/02/2017), Chronic coronary artery disease (10/18/2017), Chronic ischemic heart disease (11/13/1998), Chronic neck pain (05/16/2022), Chronic pain syndrome (06/04/2022), Chronic respiratory failure with hypoxia (HCC) (05/16/2022), CKD (chronic kidney disease) stage 3, GFR 30-59 ml/min (HCC) (07/25/2019), COPD (chronic obstructive pulmonary disease) (HCC), COPD exacerbation (HCC) (07/30/2023), Coronary artery disease involving native coronary artery of native heart with angina pectoris (HCC) (12/01/2016), Deficiency anemia (08/25/2021), Diabetic polyneuropathy associated with type 2 diabetes mellitus (HCC) (05/16/2022), Dyslipidemia (12/01/2016), Encounter for Medicare annual wellness exam (01/13/2023), Erectile dysfunction due to diseases classified elsewhere (06/12/2019), GERD (gastroesophageal reflux disease), Hallucination (10/08/2022), Hyperlipidemia, Hypertension, Hypertensive heart disease with heart failure (HCC), Insomnia (01/28/2021), Iron deficiency (07/07/2023), Iron deficiency anemia due to chronic blood loss (08/25/2021), Lesion of vocal cord, 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), Long-term use of aspirin therapy (10/02/2018), Lumbar back pain (06/04/2022), Lung nodules (08/17/2022), Malfunction of penile prosthesis (HCC) (01/14/2019), Malignant neoplasm of skin (01/28/2021), Melanoma of back (HCC), Memory loss (03/08/2020), Moderate recurrent major depression (HCC) (03/02/2023), Mucopurulent chronic bronchitis (HCC) (04/08/2023), Other ill-defined and unknown causes of morbidity and mortality (11/13/1993), PAD (peripheral artery disease) (HCC) (10/18/2017), Peripheral vascular  disease (HCC), Peyronie's disease (06/12/2019), Squamous cell carcinoma of larynx (HCC) (12/13/2016), Throat cancer (HCC), Tobacco use disorder (06/12/2019), Type 2 diabetes mellitus with hyperglycemia, with long-term current use of insulin (HCC) (07/30/2023), URI, acute (05/31/2023), Urinary urgency (04/08/2023), Ventricular ectopy (11/09/2022), Vertigo (04/08/2023), and Vitamin D deficiency (01/13/2023).   reports that he has been smoking cigarettes. He has a 50 pack-year smoking history. He has never used smokeless tobacco.  Past Surgical History:  Procedure Laterality Date   ABDOMINAL ANGIOGRAM N/A 01/13/2015   Procedure: ABDOMINAL ANGIOGRAM;  Surgeon: Larina Earthly, MD;  Location: [facility] CATH LAB;  Service: Cardiovascular;  Laterality: N/A;   APPENDECTOMY     BACK SURGERY     CARDIAC CATHETERIZATION  2000/2012   with stents in 2000   CHOLECYSTECTOMY     COLONOSCOPY     ELBOW SURGERY     bilateral   ESOPHAGOGASTRODUODENOSCOPY     KNEE SURGERY Left    MICROLARYNGOSCOPY WITH CO2 LASER AND EXCISION OF VOCAL CORD LESION N/A 11/23/2016   Procedure: MICROLARYNGOSCOPY  AND EXCISION OF VOCAL CORD LESION;  Surgeon: Suzanna Obey, MD;  Location: [facility] OR;  Service: ENT;  Laterality: N/A;   MICROLARYNGOSCOPY WITH CO2 LASER AND EXCISION OF VOCAL CORD LESION N/A 01/11/2017   Procedure: MICROLARYNGOSCOPY WITH CO2 LASER AND EXCISION OF VOCAL CORD LESION;  Surgeon: Suzanna Obey, MD;  Location: [facility] OR;  Service: ENT;  Laterality: N/A;   MICROLARYNGOSCOPY WITH LASER N/A 03/16/2015   Procedure: MICROLARYNGOSCOPY ;  Surgeon: Suzanna Obey, MD;  Location: [facility] OR;  Service: ENT;  Laterality: N/A;   peyronie's surgery     SHOULDER SURGERY Right    rotator cuff   SPINE SURGERY  09/2020   cervical surgery. steel plate, bone spurs, allograft.   THROAT SURGERY  1997   cancer removed    Allergies  Allergen Reactions   Penicillins Rash and Hives    Has patient had a PCN reaction causing immediate rash, facial/tongue/throat  swelling, SOB or lightheadedness with hypotension:unsure Has patient  had a PCN reaction causing severe rash involving mucus membranes or skin necrosis:unsure Has patient had a PCN reaction that required hospitalization:No Has patient had a PCN reaction occurring within the last 10 years:No If all of the above answers are "NO", then may proceed with Cephalosporin use.  rash   Bactrim [Sulfamethoxazole-Trimethoprim]     Dizziness, confusion, involuntary movements   Doxycycline Rash   Iodinated Contrast Media Rash    Also developed blisters   Penicillin G Rash   Tramadol Rash    Immunization History  Administered Date(s) Administered   Fluad Quad(high Dose 65+) 08/16/2020, 09/01/2021, 08/17/2022   H1N1 09/13/2008   Influenza Split 09/01/2021   Influenza, High Dose Seasonal PF 10/13/2017, 08/26/2019, 08/16/2020   Influenza-Unspecified 11/13/2002, 08/19/2004, 09/13/2004, 09/13/2006, 10/17/2007, 08/12/2008, 10/13/2017, 08/26/2019   Moderna SARS-COV2 Booster Vaccination 10/12/2020   Moderna Sars-Covid-2 Vaccination 10/12/2020   PNEUMOCOCCAL CONJUGATE-20 01/11/2023   Pfizer(Comirnaty)Fall Seasonal Vaccine 12 years and older 09/22/2022   Pneumococcal Conjugate-13 03/13/2000, 10/01/2015   Pneumococcal Polysaccharide-23 11/13/2002, 08/13/2009   Pneumococcal-Unspecified 03/13/2000   Td 11/13/1996   Td (Adult),5 Lf Tetanus Toxid, Preservative Free 11/13/1996    Family History  Problem Relation Age of Onset   Cancer Mother        bone   Hypertension Mother    Stroke Father    Hypertension Father    Healthy Child    Cancer Other        brain   Diabetes Other      Current Outpatient Medications:    ALPRAZolam (XANAX) 0.25 MG tablet, TAKE 1 TABLET BY MOUTH ONCE DAILY AS NEEDED FOR ANXIETY, Disp: 30 tablet, Rfl: 2   arformoterol (BROVANA) 15 MCG/2ML NEBU, Take 2 mLs (15 mcg total) by nebulization 2 (two) times daily., Disp: 120 mL, Rfl: 6   azithromycin (ZITHROMAX) 250 MG tablet,  Take 1 tablet (250 mg total) by mouth every Monday, Wednesday, and Friday., Disp: 12 tablet, Rfl: 2   budesonide (PULMICORT) 0.25 MG/2ML nebulizer solution, Take 2 mLs (0.25 mg total) by nebulization in the morning and at bedtime., Disp: 60 mL, Rfl: 11   clopidogrel (PLAVIX) 75 MG tablet, Take 1 tablet by mouth once daily (Patient taking differently: Take 75 mg by mouth daily.), Disp: 90 tablet, Rfl: 0   cyanocobalamin (VITAMIN B12) 1000 MCG/ML injection, INJECT 1 ML (CC) INTRAMUSCULARLY ONCE A WEEK (Patient taking differently: Inject 100 mcg into the muscle every 30 (thirty) days.), Disp: 10 mL, Rfl: 0   cyclobenzaprine (FLEXERIL) 10 MG tablet, TAKE  TABLET BY MOUTH THREE TIMES DAILY AS NEEDED FOR MUSCLE SPASM (Patient taking differently: Take 10 mg by mouth 3 (three) times daily as needed for muscle spasms.), Disp: 270 tablet, Rfl: 0   Dupilumab (DUPIXENT) 300 MG/2ML SOPN, Inject 300 mg into the skin every 14 (fourteen) days., Disp: 12 mL, Rfl: 1   fenofibrate 160 MG tablet, Take 1 tablet by mouth once daily, Disp: 90 tablet, Rfl: 0   finasteride (PROSCAR) 5 MG tablet, Take 1 tablet by mouth once daily (Patient taking differently: Take 5 mg by mouth daily.), Disp: 90 tablet, Rfl: 0   gabapentin (NEURONTIN) 400 MG capsule, TAKE 1 CAPSULE BY MOUTH THREE TIMES DAILY, Disp: 270 capsule, Rfl: 0   insulin lispro (HUMALOG) 100 UNIT/ML injection, Inject 0.04 mLs (4 Units total) into the skin 3 (three) times daily with meals., Disp: 10 mL, Rfl: 11   ipratropium-albuterol (DUONEB) 0.5-2.5 (3) MG/3ML SOLN, USE 1 AMPULE IN NEBULIZER 4 TIMES DAILY (Patient taking differently:  Take 3 mLs by nebulization every 6 (six) hours as needed (sob). USE 1 AMPULE IN NEBULIZER 4 TIMES DAILY), Disp: 360 mL, Rfl: 1   Lancets (ONETOUCH DELICA PLUS LANCET33G) MISC, USE 1  TO CHECK GLUCOSE THREE TIMES DAILY (Patient taking differently: 1 each by Other route 3 (three) times daily.), Disp: 100 each, Rfl: 0   levothyroxine (SYNTHROID)  25 MCG tablet, Take 1 tablet by mouth once daily, Disp: 90 tablet, Rfl: 0   losartan (COZAAR) 50 MG tablet, Take 1 tablet (50 mg total) by mouth daily., Disp: 90 tablet, Rfl: 3   magnesium chloride (SLOW-MAG) 64 MG TBEC SR tablet, Take 1 tablet (64 mg total) by mouth daily., Disp: 90 tablet, Rfl: 3   metFORMIN (GLUCOPHAGE) 1000 MG tablet, TAKE 1 TABLET BY MOUTH TWICE DAILY WITH MEALS, Disp: 180 tablet, Rfl: 0   MYRBETRIQ 25 MG TB24 tablet, Take 1 tablet by mouth once daily (Patient taking differently: Take 25 mg by mouth daily.), Disp: 30 tablet, Rfl: 0   nitroGLYCERIN (NITROSTAT) 0.4 MG SL tablet, DISSOLVE ONE TABLET UNDER THE TONGUE EVERY 5 TO 10 MINUTES PRIOR TO ACTIVITIES WHICH MIGHT PRECIPITATE AN ATTACK (Patient taking differently: Place 0.4 mg under the tongue every 5 (five) minutes as needed for chest pain.), Disp: 25 tablet, Rfl: 0   nystatin cream (MYCOSTATIN), Apply 1 application topically 2 (two) times daily as needed for dry skin., Disp: , Rfl:    Omega-3 Fatty Acids (FISH OIL) 1000 MG CAPS, Take 2 capsules (2,000 mg total) by mouth in the morning and at bedtime., Disp: 180 capsule, Rfl: 12   omeprazole (PRILOSEC) 20 MG capsule, Take 1 capsule by mouth once daily, Disp: 90 capsule, Rfl: 0   ONETOUCH ULTRA TEST test strip, USE 1 STRIP TO CHECK GLUCOSE IN THE MORNING AND 1 AT BEDTIME AS DIRECTED (Patient taking differently: 1 each by Other route as needed for other (GLucose check).), Disp: 100 each, Rfl: 0   oxyCODONE-acetaminophen (PERCOCET/ROXICET) 5-325 MG tablet, Take 1 tablet by mouth every 6 (six) hours as needed for severe pain (pain score 7-10)., Disp: 120 tablet, Rfl: 0   revefenacin (YUPELRI) 175 MCG/3ML nebulizer solution, Take 3 mLs (175 mcg total) by nebulization daily., Disp: 90 mL, Rfl: 11   rosuvastatin (CRESTOR) 40 MG tablet, Take 1 tablet by mouth once daily (Patient taking differently: Take 40 mg by mouth daily.), Disp: 90 tablet, Rfl: 0   spironolactone (ALDACTONE) 25 MG  tablet, Take 1 tablet (25 mg total) by mouth daily., Disp: 90 tablet, Rfl: 3   tamsulosin (FLOMAX) 0.4 MG CAPS capsule, TAKE 2 CAPSULES BY MOUTH ONCE DAILY AFTER SUPPER (Patient taking differently: Take 0.8 mg by mouth daily after supper.), Disp: 180 capsule, Rfl: 0   torsemide (DEMADEX) 20 MG tablet, Take 1 tablet (20 mg total) by mouth as needed (edema)., Disp: 30 tablet, Rfl: 0   TOUJEO SOLOSTAR 300 UNIT/ML Solostar Pen, INJECT 30 UNITS SUBCUTANEOUSLY ONCE DAILY (Patient taking differently: Inject 30 Units into the skin daily. INJECT 30 UNITS SUBCUTANEOUSLY ONCE DAILY), Disp: 6 mL, Rfl: 0   traZODone (DESYREL) 150 MG tablet, Take 1 tablet (150 mg total) by mouth daily., Disp: 270 tablet, Rfl: 0   VENTOLIN HFA 108 (90 Base) MCG/ACT inhaler, INHALE 2 PUFFS BY MOUTH EVERY 6 HOURS AS NEEDED FOR SHORTNESS OF BREATH FOR WHEEZING (Patient taking differently: Inhale 2 puffs into the lungs every 6 (six) hours as needed for shortness of breath or wheezing.), Disp: 18 g, Rfl: 0  Vitamin D, Ergocalciferol, (DRISDOL) 1.25 MG (50000 UNIT) CAPS capsule, TAKE 1 CAPSULE BY MOUTH TWICE A WEEK (Patient taking differently: Take 50,000 Units by mouth every 7 (seven) days.), Disp: 12 capsule, Rfl: 3   DULoxetine (CYMBALTA) 60 MG capsule, Take 1 capsule (60 mg total) by mouth 2 (two) times daily. (Patient not taking: Reported on 09/18/2023), Disp: 60 capsule, Rfl: 3      Objective:   Vitals:   09/18/23 1309  BP: (!) 129/57  Pulse: 74  SpO2: 94%  Weight: 195 lb 9.6 oz (88.7 kg)  Height: 5\' 10"  (1.778 m)    Estimated body mass index is 28.07 kg/m as calculated from the following:   Height as of this encounter: 5\' 10"  (1.778 m).   Weight as of this encounter: 195 lb 9.6 oz (88.7 kg).  @WEIGHTCHANGE @  Filed Weights   09/18/23 1309  Weight: 195 lb 9.6 oz (88.7 kg)     Physical Exam   General: No distress. Chronic unwell O2 at rest: no Cane present: no Sitting in wheel chair: YES Frail: NO Obese:  no Neuro: Alert and Oriented x 3. GCS 15. Speech normal Psych: Pleasant Resp:  Barrel Chest - YES.  Wheeze - no, Crackles - no, No overt respiratory distress CVS: Normal heart sounds. Murmurs - no Ext: Stigmata of Connective Tissue Disease - no HEENT: Normal upper airway. PEERL +. No post nasal drip        Assessment:       ICD-10-CM   1. Stage 3 severe COPD by GOLD classification (HCC)  J44.9     2. COPD with acute exacerbation (HCC)  J44.1     3. COPD, frequent exacerbations (HCC)  J44.1     4. Tobacco use disorder  F17.200     5. Chronic sinusitis, unspecified location  J32.9          Plan:     Patient Instructions     ICD-10-CM   1. Stage 3 severe COPD by GOLD classification (HCC)  J44.9     2. COPD with acute exacerbation (HCC)  J44.1     3. COPD, frequent exacerbations (HCC)  J44.1     4. Tobacco use disorder  F17.200     5. Chronic sinusitis, unspecified location  J32.9       Stage 3 severe COPD by GOLD classification (HCC) COPD with acute exacerbation (HCC) COPD, frequent exacerbations (HCC) Tobacco abuse disorder  -Currently coming off another exacerbation.  You continue to have exacerbations despite Dupixent and azithromycin.  Noted that Dupixent helped a little bit but azithromycin has not helped.    -Smoking remains a risk factor for frequent exacerbations  Plan - You need to quit smoking and I think that will help prevent exacerbations -Stop azithromycin because it is not helping you - Continue Roxy Manns, DuoNeb and Dupixent as before -Stop fish oil to see if you might have any silent acid reflux it is negatively impacting her lung - START OTHUVAYRE  -If this does not help you then we will commit to chronic daily prednisone -Take prednisone taper as follows  - Please take Take prednisone 40mg  once daily x 3 days, then 30mg  once daily x 3 days, then 20mg  once daily x 3 days, then prednisone 10mg  once daily  x 3 days and stop -Referral  quit smoking cessation clinic at [facility]  Chronic sinusitis, unspecified location Plan - -Refer ENT for chronic sinusitis in case have not seen them before  Follow up   - with Dr. Marchelle Gearing in 8 weeks 15-minute slot; return sooner if needed   FOLLOWUP Return in about 8 weeks (around 11/13/2023) for 15 min visit, with Dr Marchelle Gearing, Face to Face Visit.    SIGNATURE    Dr. Kalman Shan, M.D., F.C.C.P,  Pulmonary and Critical Care Medicine Staff Physician, [facility] Director - Interstitial Lung Disease  Program  Pulmonary Fibrosis [facility] at [facility] [place], [place], [address]  Pager: [phone], If no answer or between  15:00h - 7:00h: call [phone] Telephone: [phone]  1:38 PM 09/18/2023

## 2023-09-17 NOTE — Patient Instructions (Incomplete)
    ICD-10-CM   1. Stage 3 severe COPD by GOLD classification (HCC)  J44.9     2. COPD with acute exacerbation (HCC)  J44.1     3. COPD, frequent exacerbations (HCC)  J44.1     4. Tobacco use disorder  F17.200     5. Chronic sinusitis, unspecified location  J32.9       Stage 3 severe COPD by GOLD classification (HCC) COPD with acute exacerbation (HCC) COPD, frequent exacerbations (HCC) Tobacco abuse disorder  -Currently coming off another exacerbation.  You continue to have exacerbations despite Dupixent and azithromycin.  Noted that Dupixent helped a little bit but azithromycin has not helped.    -Smoking remains a risk factor for frequent exacerbations  Plan - You need to quit smoking and I think that will help prevent exacerbations -Stop azithromycin because it is not helping you - Continue Roxy Manns, DuoNeb and Dupixent as before -Stop fish oil to see if you might have any silent acid reflux it is negatively impacting her lung - START OTHUVAYRE  -If this does not help you then we will commit to chronic daily prednisone -Take prednisone taper as follows  - Please take Take prednisone 40mg  once daily x 3 days, then 30mg  once daily x 3 days, then 20mg  once daily x 3 days, then prednisone 10mg  once daily  x 3 days and stop -Referral quit smoking cessation clinic at [facility]  Chronic sinusitis, unspecified location Plan - -Refer ENT for chronic sinusitis in case have not seen them before    Follow up   - with Dr. Marchelle Gearing in 8 weeks 15-minute slot; return sooner if needed

## 2023-09-18 ENCOUNTER — Encounter: Payer: Self-pay | Admitting: Cardiology

## 2023-09-18 ENCOUNTER — Encounter: Payer: Self-pay | Admitting: Internal Medicine

## 2023-09-18 ENCOUNTER — Ambulatory Visit: Payer: Medicare Other | Admitting: Internal Medicine

## 2023-09-18 VITALS — BP 129/57 | HR 74 | Ht 70.0 in | Wt 195.6 lb

## 2023-09-18 DIAGNOSIS — J441 Chronic obstructive pulmonary disease with (acute) exacerbation: Secondary | ICD-10-CM

## 2023-09-18 DIAGNOSIS — J329 Chronic sinusitis, unspecified: Secondary | ICD-10-CM | POA: Diagnosis not present

## 2023-09-18 DIAGNOSIS — J449 Chronic obstructive pulmonary disease, unspecified: Secondary | ICD-10-CM | POA: Diagnosis not present

## 2023-09-18 DIAGNOSIS — F17200 Nicotine dependence, unspecified, uncomplicated: Secondary | ICD-10-CM

## 2023-09-19 ENCOUNTER — Encounter: Payer: Self-pay | Admitting: Internal Medicine

## 2023-09-19 DIAGNOSIS — J441 Chronic obstructive pulmonary disease with (acute) exacerbation: Secondary | ICD-10-CM

## 2023-09-19 MED ORDER — PREDNISONE 10 MG PO TABS: ORAL_TABLET | ORAL | 0 refills | Status: AC

## 2023-09-19 NOTE — Telephone Encounter (Signed)
Patient needs steroids called in.  [facility] on [address]

## 2023-09-20 ENCOUNTER — Telehealth: Payer: Self-pay | Admitting: Pharmacist

## 2023-09-20 NOTE — Telephone Encounter (Signed)
Received [facility] referral form from Dr. Marchelle Gearing. Faxed to [employer] with clinicals and insurance card copy  Phone: [phone] Fax: [phone]

## 2023-09-25 ENCOUNTER — Other Ambulatory Visit: Payer: Self-pay | Admitting: Family Medicine

## 2023-09-25 ENCOUNTER — Other Ambulatory Visit: Payer: Self-pay | Admitting: Primary Care

## 2023-09-27 ENCOUNTER — Telehealth: Payer: Self-pay | Admitting: Pharmacist

## 2023-09-27 ENCOUNTER — Encounter: Payer: Self-pay | Admitting: Internal Medicine

## 2023-09-27 MED ORDER — DUPIXENT 300 MG/2ML ~~LOC~~ SOAJ: 300.0000 mg | SUBCUTANEOUS | 1 refills | Status: DC

## 2023-09-27 NOTE — Telephone Encounter (Signed)
Didn't receive any refill requests. Refill sent to [facility] today  Chesley Mires, PharmD, MPH, BCPS, CPP Clinical Pharmacist (Rheumatology and Pulmonology)

## 2023-09-27 NOTE — Progress Notes (Signed)
Refill sent to [facility] today for Dupixent. Notified patient via MyChart

## 2023-09-27 NOTE — Telephone Encounter (Signed)
Pt wife calling in stating the Dupixent prescription has been faxed over 2x already

## 2023-10-01 ENCOUNTER — Other Ambulatory Visit: Payer: Self-pay | Admitting: Internal Medicine

## 2023-10-01 DIAGNOSIS — J441 Chronic obstructive pulmonary disease with (acute) exacerbation: Secondary | ICD-10-CM

## 2023-10-02 ENCOUNTER — Encounter: Payer: Self-pay | Admitting: Family Medicine

## 2023-10-02 ENCOUNTER — Other Ambulatory Visit: Payer: Self-pay

## 2023-10-02 DIAGNOSIS — J441 Chronic obstructive pulmonary disease with (acute) exacerbation: Secondary | ICD-10-CM

## 2023-10-02 NOTE — Telephone Encounter (Signed)
Referral placed to [facility] per request.

## 2023-10-03 ENCOUNTER — Other Ambulatory Visit: Payer: Self-pay | Admitting: Family Medicine

## 2023-10-05 ENCOUNTER — Other Ambulatory Visit: Payer: Self-pay | Admitting: Family Medicine

## 2023-10-07 ENCOUNTER — Encounter: Payer: Self-pay | Admitting: Family Medicine

## 2023-10-08 ENCOUNTER — Other Ambulatory Visit: Payer: Self-pay | Admitting: Family Medicine

## 2023-10-08 ENCOUNTER — Other Ambulatory Visit: Payer: Self-pay | Admitting: Primary Care

## 2023-10-08 ENCOUNTER — Other Ambulatory Visit: Payer: Self-pay | Admitting: Physician Assistant

## 2023-10-08 MED ORDER — OXYCODONE-ACETAMINOPHEN 5-325 MG PO TABS: 1.0000 | ORAL_TABLET | Freq: Four times a day (QID) | ORAL | 0 refills | Status: DC | PRN

## 2023-10-08 NOTE — Telephone Encounter (Signed)
The patient is requesting a flu and covid shot. A nurse visit appointment has been scheduled for Wednesday.

## 2023-10-09 DIAGNOSIS — I493 Ventricular premature depolarization: Secondary | ICD-10-CM | POA: Insufficient documentation

## 2023-10-09 DIAGNOSIS — I4510 Unspecified right bundle-branch block: Secondary | ICD-10-CM

## 2023-10-09 HISTORY — DX: Unspecified right bundle-branch block: I45.10

## 2023-10-09 HISTORY — DX: Ventricular premature depolarization: I49.3

## 2023-10-10 ENCOUNTER — Ambulatory Visit (INDEPENDENT_AMBULATORY_CARE_PROVIDER_SITE_OTHER): Payer: Medicare Other

## 2023-10-10 DIAGNOSIS — Z23 Encounter for immunization: Secondary | ICD-10-CM | POA: Diagnosis not present

## 2023-10-10 DIAGNOSIS — I4510 Unspecified right bundle-branch block: Secondary | ICD-10-CM | POA: Diagnosis not present

## 2023-10-10 DIAGNOSIS — J449 Chronic obstructive pulmonary disease, unspecified: Secondary | ICD-10-CM | POA: Diagnosis not present

## 2023-10-10 DIAGNOSIS — I493 Ventricular premature depolarization: Secondary | ICD-10-CM | POA: Diagnosis not present

## 2023-10-10 DIAGNOSIS — F17210 Nicotine dependence, cigarettes, uncomplicated: Secondary | ICD-10-CM | POA: Diagnosis not present

## 2023-10-17 DIAGNOSIS — J449 Chronic obstructive pulmonary disease, unspecified: Secondary | ICD-10-CM | POA: Diagnosis not present

## 2023-10-17 DIAGNOSIS — J9610 Chronic respiratory failure, unspecified whether with hypoxia or hypercapnia: Secondary | ICD-10-CM | POA: Diagnosis not present

## 2023-10-22 ENCOUNTER — Ambulatory Visit (INDEPENDENT_AMBULATORY_CARE_PROVIDER_SITE_OTHER): Payer: Medicare Other | Admitting: Family Medicine

## 2023-10-22 DIAGNOSIS — J441 Chronic obstructive pulmonary disease with (acute) exacerbation: Secondary | ICD-10-CM | POA: Diagnosis not present

## 2023-10-22 MED ORDER — PREDNISONE 10 MG PO TABS: ORAL_TABLET | ORAL | 0 refills | Status: AC

## 2023-10-22 MED ORDER — LEVOFLOXACIN 750 MG PO TABS: 750.0000 mg | ORAL_TABLET | Freq: Every day | ORAL | 0 refills | Status: AC

## 2023-10-22 NOTE — Progress Notes (Signed)
Virtual Visit via Video Note   This visit type was conducted due to national recommendations for restrictions regarding the COVID-19 Pandemic (e.g. social distancing) in an effort to limit this patient's exposure and mitigate transmission in our community.  Due to his co-morbid illnesses, this patient is at least at moderate risk for complications without adequate follow up.  This format is felt to be most appropriate for this patient at this time.  All issues noted in this document were discussed and addressed.  No physical exam could be performed with this format.  Patient verbally consented to a video telehealth visit.   Date:  10/22/2023   ID:  Don [name], DOB 07-21-1951, MRN [id number]  Patient Location: Home Provider Location: Office/Clinic  PCP:  Blane Ohara, MD   Chief Complaint:  COPD exacerbation   History of Present Illness:    Don [name] is a 72 y.o. male with a history of COPD, presents with a cough, shortness of breath, and decreased energy. The symptoms began after exposure to wind during a doctor's appointment. Despite using nebulizer treatments, the patient's symptoms have not improved. The patient's oxygen saturation drops to 87% when not on 2-3 liters of oxygen. The patient's wife reports that the patient's symptoms are usually managed with steroids and Levaquin during COPD exacerbations. The patient has been on prednisone multiple times this year. The patient is still smoking also. Patient denies chest pain.  The patient does have symptoms concerning for COVID-19 infection (fever, chills, cough, or new shortness of breath).   Past Medical History:  Diagnosis Date   Absence of bladder continence 01/08/2022   Acute cough 07/07/2023   Acute infection of nasal sinus 01/13/2023   Anxiety state 02/02/2022   Arthritis    Asymptomatic LV dysfunction 10/18/2017   Atherosclerosis of aorta (HCC) 01/13/2023   Atypical chest pain 11/09/2022   B12 deficiency  anemia 08/25/2021   Basal cell carcinoma    Benign prostatic hyperplasia with incomplete bladder emptying 01/14/2019   Bone pain 03/08/2020   Cancer (HCC)    throat - 1997, throat - 2018   Cardiomyopathy, secondary (HCC) 12/01/2016   Overview:  EF 47% 12/26/16   Chest pain syndrome 11/02/2017   Chronic coronary artery disease 10/18/2017   Chronic ischemic heart disease 11/13/1998   Jan 22, 2003 Entered By: Aris Lot J Comment:  massive Mi in 2000 per patient hsitory, 2nd Mi in 2001Mar 11, 2004 Entered By: Aris Lot J Comment: Had stent placedin 2000,cath/angio in 2001   Chronic neck pain 05/16/2022   Chronic pain syndrome 06/04/2022   Chronic respiratory failure with hypoxia (HCC) 05/16/2022   CKD (chronic kidney disease) stage 3, GFR 30-59 ml/min (HCC) 07/25/2019   COPD (chronic obstructive pulmonary disease) (HCC)    COPD exacerbation (HCC) 07/30/2023   Coronary artery disease involving native coronary artery of native heart with angina pectoris (HCC) 12/01/2016   Overview:  He has hx of IWMI in remote past, last cath in 2012 at [facility] showed chronic total occlusion of previously stented RCA, with good collaterals, a 40% LAD stenosis, inferior hypokinesis and EF 45%.    He's been lost to Cardiology f/u since 2015, but has not had any recurrent events   Deficiency anemia 08/25/2021   Diabetic polyneuropathy associated with type 2 diabetes mellitus (HCC) 05/16/2022   Dyslipidemia 12/01/2016   Encounter for Medicare annual wellness exam 01/13/2023   Erectile dysfunction due to diseases classified elsewhere 06/12/2019   GERD (gastroesophageal reflux disease)  Hallucination 10/08/2022   Hyperlipidemia    Hypertension    Hypertensive heart disease with heart failure (HCC)    Insomnia 01/28/2021   Iron deficiency 07/07/2023   Iron deficiency anemia due to chronic blood loss 08/25/2021   Lesion of vocal cord    Leukoplakia of larynx 11/15/2016   Long-term use of aspirin therapy  10/02/2018   Lumbar back pain 06/04/2022   Lung nodules 08/17/2022   Malfunction of penile prosthesis (HCC) 01/14/2019   Malignant neoplasm of skin 01/28/2021   Formatting of this note might be different from the original. Jan 22, 2003 Entered By: Aris Lot J Comment: of skin - removed x2 inpast Jan 22, 2003 Entered By: Aris Lot J Comment: of skin - removed x2 inpast   Melanoma of back (HCC)    melanoma on back   Memory loss 03/08/2020   Moderate recurrent major depression (HCC) 03/02/2023   Mucopurulent chronic bronchitis (HCC) 04/08/2023   Other ill-defined and unknown causes of morbidity and mortality 11/13/1993   Formatting of this note might be different from the original. Jan 31, 2008 Entered By: ARMOUR,ROSS B Comment: lumbar, 8/08 Jan 22, 2003 Entered By: Aris Lot J Comment: x62yrs in work place  quit 1yrs ago in 2000 Jan 22, 2003 Entered By: Aris Lot J Comment: x69yrs in work place  quit 68yrs ago in 2000 Jan 31, 2008 Entered By: Bennie Pierini B Comment: lumbar, 8/08   PAD (peripheral artery disease) (HCC) 10/18/2017   Peripheral vascular disease (HCC)    iliac artery clot   Peyronie's disease 06/12/2019   Squamous cell carcinoma of larynx (HCC) 12/13/2016   Throat cancer (HCC)    Tobacco use disorder 06/12/2019   Type 2 diabetes mellitus with hyperglycemia, with long-term current use of insulin (HCC) 07/30/2023   URI, acute 05/31/2023   Urinary urgency 04/08/2023   Ventricular ectopy 11/09/2022   Vertigo 04/08/2023   Vitamin D deficiency 01/13/2023   Past Surgical History:  Procedure Laterality Date   ABDOMINAL ANGIOGRAM N/A 01/13/2015   Procedure: ABDOMINAL ANGIOGRAM;  Surgeon: Larina Earthly, MD;  Location: [facility] CATH LAB;  Service: Cardiovascular;  Laterality: N/A;   APPENDECTOMY     BACK SURGERY     CARDIAC CATHETERIZATION  2000/2012   with stents in 2000   CHOLECYSTECTOMY     COLONOSCOPY     ELBOW SURGERY     bilateral   ESOPHAGOGASTRODUODENOSCOPY      KNEE SURGERY Left    MICROLARYNGOSCOPY WITH CO2 LASER AND EXCISION OF VOCAL CORD LESION N/A 11/23/2016   Procedure: MICROLARYNGOSCOPY  AND EXCISION OF VOCAL CORD LESION;  Surgeon: Suzanna Obey, MD;  Location: [facility] OR;  Service: ENT;  Laterality: N/A;   MICROLARYNGOSCOPY WITH CO2 LASER AND EXCISION OF VOCAL CORD LESION N/A 01/11/2017   Procedure: MICROLARYNGOSCOPY WITH CO2 LASER AND EXCISION OF VOCAL CORD LESION;  Surgeon: Suzanna Obey, MD;  Location: [facility] OR;  Service: ENT;  Laterality: N/A;   MICROLARYNGOSCOPY WITH LASER N/A 03/16/2015   Procedure: MICROLARYNGOSCOPY ;  Surgeon: Suzanna Obey, MD;  Location: [facility] OR;  Service: ENT;  Laterality: N/A;   peyronie's surgery     SHOULDER SURGERY Right    rotator cuff   SPINE SURGERY  09/2020   cervical surgery. steel plate, bone spurs, allograft.   THROAT SURGERY  1997   cancer removed    @FHX @  Current Meds  Medication Sig   levofloxacin (LEVAQUIN) 750 MG tablet Take 1 tablet (750 mg total) by mouth daily for 7 days.   predniSONE (  DELTASONE) 10 MG tablet Take 4 tablets (40 mg total) by mouth daily with breakfast for 2 days, THEN 3 tablets (30 mg total) daily with breakfast for 2 days, THEN 2 tablets (20 mg total) daily with breakfast for 2 days, THEN 1 tablet (10 mg total) daily with breakfast for 2 days, THEN 0.5 tablets (5 mg total) daily with breakfast for 2 days.     Allergies:   Penicillins, Bactrim [sulfamethoxazole-trimethoprim], Doxycycline, Iodinated contrast media, Penicillin g, and Tramadol   Social History   Tobacco Use   Smoking status: Every Day    Current packs/day: 1.00    Average packs/day: 1 pack/day for 50.0 years (50.0 ttl pk-yrs)    Types: Cigarettes   Smokeless tobacco: Never   Tobacco comments:    down to .5 ppd  11/02/2022 hfb  Vaping Use   Vaping status: Former  Substance Use Topics   Drug use: No     Family Hx: The patient's family history includes Cancer in his mother and another family member; Diabetes in an other  family member; Healthy in his child; Hypertension in his father and mother; Stroke in his father.  Review of Systems  Constitutional:  Positive for malaise/fatigue. Negative for chills and fever.  HENT:  Positive for congestion. Negative for sinus pain and sore throat.           Respiratory:  Positive for cough and shortness of breath.   Cardiovascular:  Negative for chest pain.  Gastrointestinal:  Negative for diarrhea, nausea and vomiting.  Genitourinary: Negative.   Neurological:  Negative for dizziness and headaches.  Psychiatric/Behavioral:  Negative for depression. The patient is not nervous/anxious.     Labs/Other Tests and Data Reviewed:    Recent Labs: 11/09/2022: B Natriuretic Peptide 216.5 05/31/2023: Magnesium 1.6; TSH 2.200 07/06/2023: ALT 13; BUN 16; Creatinine, Ser 1.12; Hemoglobin 11.9; NT-Pro BNP 665; Platelets 214; Potassium 4.3; Sodium 144   Recent Lipid Panel Lab Results  Component Value Date/Time   CHOL 119 07/06/2023 11:47 AM   TRIG 297 (H) 07/06/2023 11:47 AM   HDL 27 (L) 07/06/2023 11:47 AM   CHOLHDL 4.4 07/06/2023 11:47 AM   LDLCALC 46 07/06/2023 11:47 AM    Wt Readings from Last 3 Encounters:  09/18/23 195 lb 9.6 oz (88.7 kg)  09/14/23 193 lb (87.5 kg)  07/30/23 192 lb (87.1 kg)     Objective:    Vital Signs:  There were no vitals taken for this visit.   VITAL SIGNS:  reviewed GEN:  no acute distress EYES:  sclerae anicteric, EOMI - Extraocular Movements Intact RESPIRATORY:  normal respiratory effort, symmetric expansion CARDIOVASCULAR:  no peripheral edema SKIN:  no rash, lesions or ulcers. MUSCULOSKELETAL:  no obvious deformities. NEURO:  alert and oriented x 3, no obvious focal deficit PSYCH:  normal affect  ASSESSMENT & PLAN:    COPD exacerbation (HCC) Acute -Use Oxygen as needed -Cut back or stop smoking during exacerbation -Continue scheduled nebulizer treatments. -Use DuoNeb 4 times a day for the next 2 days and then 4 times a  day as needed. -Sent Levaquin 750 mg by mouth for 7 days -Prednisone Take 4 tablets (40 mg total) by mouth daily with breakfast for 2 days, THEN 3 tablets (30 mg total) daily with breakfast for 2 days, THEN 2 tablets (20 mg total) daily with breakfast for 2 days, THEN 1 tablet (10 mg total) daily with breakfast for 2 days, THEN 0.5 tablets (5 mg total) daily with breakfast for 2  days.  -Increase fluids and drink hot tea and/or lemon and honey for cough    COVID-19 Education: The signs and symptoms of COVID-19 were discussed with the patient and how to seek care for testing (follow up with PCP or arrange E-visit). The importance of social distancing was discussed today.  Time:   Today, I have spent 10 minutes with the patient with telehealth technology discussing the above problems.     Medication Adjustments/Labs and Tests Ordered: Current medicines are reviewed at length with the patient today.  Concerns regarding medicines are outlined above.   Tests Ordered: No orders of the defined types were placed in this encounter.   Medication Changes: Meds ordered this encounter  Medications   levofloxacin (LEVAQUIN) 750 MG tablet    Sig: Take 1 tablet (750 mg total) by mouth daily for 7 days.    Dispense:  7 tablet    Refill:  0   predniSONE (DELTASONE) 10 MG tablet    Sig: Take 4 tablets (40 mg total) by mouth daily with breakfast for 2 days, THEN 3 tablets (30 mg total) daily with breakfast for 2 days, THEN 2 tablets (20 mg total) daily with breakfast for 2 days, THEN 1 tablet (10 mg total) daily with breakfast for 2 days, THEN 0.5 tablets (5 mg total) daily with breakfast for 2 days.    Dispense:  21 tablet    Refill:  0    Follow Up:  In Person prn  Signed, Lajuana Matte, FNP [facility] [phone]  10/22/2023 10:17 PM    [facility]

## 2023-10-22 NOTE — Assessment & Plan Note (Signed)
Acute -Use Oxygen as needed -Cut back or stop smoking during exacerbation -Continue scheduled nebulizer treatments. -Use DuoNeb 4 times a day for the next 2 days and then 4 times a day as needed. -Sent Levaquin 750 mg by mouth for 7 days -Prednisone Take 4 tablets (40 mg total) by mouth daily with breakfast for 2 days, THEN 3 tablets (30 mg total) daily with breakfast for 2 days, THEN 2 tablets (20 mg total) daily with breakfast for 2 days, THEN 1 tablet (10 mg total) daily with breakfast for 2 days, THEN 0.5 tablets (5 mg total) daily with breakfast for 2 days.  -Increase fluids and drink hot tea and/or lemon and honey for cough

## 2023-10-23 ENCOUNTER — Other Ambulatory Visit: Payer: Self-pay | Admitting: Family Medicine

## 2023-10-23 ENCOUNTER — Encounter: Payer: Self-pay | Admitting: Cardiology

## 2023-10-25 ENCOUNTER — Encounter: Payer: Self-pay | Admitting: Family Medicine

## 2023-10-25 ENCOUNTER — Other Ambulatory Visit: Payer: Self-pay

## 2023-10-25 DIAGNOSIS — I739 Peripheral vascular disease, unspecified: Secondary | ICD-10-CM

## 2023-10-25 NOTE — Telephone Encounter (Signed)
Referral ordered

## 2023-11-02 ENCOUNTER — Other Ambulatory Visit: Payer: Self-pay | Admitting: Cardiology

## 2023-11-02 ENCOUNTER — Other Ambulatory Visit: Payer: Self-pay | Admitting: Family Medicine

## 2023-11-02 DIAGNOSIS — E1142 Type 2 diabetes mellitus with diabetic polyneuropathy: Secondary | ICD-10-CM

## 2023-11-04 DIAGNOSIS — J810 Acute pulmonary edema: Secondary | ICD-10-CM | POA: Diagnosis not present

## 2023-11-04 DIAGNOSIS — I2510 Atherosclerotic heart disease of native coronary artery without angina pectoris: Secondary | ICD-10-CM | POA: Diagnosis not present

## 2023-11-04 DIAGNOSIS — J449 Chronic obstructive pulmonary disease, unspecified: Secondary | ICD-10-CM | POA: Diagnosis not present

## 2023-11-04 DIAGNOSIS — K219 Gastro-esophageal reflux disease without esophagitis: Secondary | ICD-10-CM | POA: Diagnosis not present

## 2023-11-04 DIAGNOSIS — Z8673 Personal history of transient ischemic attack (TIA), and cerebral infarction without residual deficits: Secondary | ICD-10-CM | POA: Diagnosis not present

## 2023-11-04 DIAGNOSIS — F17210 Nicotine dependence, cigarettes, uncomplicated: Secondary | ICD-10-CM | POA: Diagnosis not present

## 2023-11-04 DIAGNOSIS — R911 Solitary pulmonary nodule: Secondary | ICD-10-CM | POA: Diagnosis not present

## 2023-11-04 DIAGNOSIS — R9431 Abnormal electrocardiogram [ECG] [EKG]: Secondary | ICD-10-CM | POA: Diagnosis not present

## 2023-11-04 DIAGNOSIS — F0390 Unspecified dementia without behavioral disturbance: Secondary | ICD-10-CM | POA: Diagnosis not present

## 2023-11-04 DIAGNOSIS — I509 Heart failure, unspecified: Secondary | ICD-10-CM | POA: Diagnosis not present

## 2023-11-04 DIAGNOSIS — R0609 Other forms of dyspnea: Secondary | ICD-10-CM | POA: Diagnosis not present

## 2023-11-04 DIAGNOSIS — R0602 Shortness of breath: Secondary | ICD-10-CM | POA: Diagnosis not present

## 2023-11-04 LAB — LAB REPORT - SCANNED: EGFR: 93

## 2023-11-05 ENCOUNTER — Other Ambulatory Visit: Payer: Self-pay | Admitting: Primary Care

## 2023-11-05 ENCOUNTER — Other Ambulatory Visit: Payer: Self-pay | Admitting: Family Medicine

## 2023-11-05 DIAGNOSIS — E1142 Type 2 diabetes mellitus with diabetic polyneuropathy: Secondary | ICD-10-CM

## 2023-11-07 ENCOUNTER — Telehealth: Payer: Self-pay | Admitting: Adult Health

## 2023-11-07 ENCOUNTER — Telehealth: Payer: Self-pay | Admitting: Physician Assistant

## 2023-11-07 MED ORDER — PREDNISONE 10 MG PO TABS: ORAL_TABLET | ORAL | 0 refills | Status: DC

## 2023-11-07 NOTE — Telephone Encounter (Signed)
Patient's wife paged after hours answering service.  He was recently seen in the ED for shortness of breath.  Wife was told he had volume overload however was treated with both steroid and also diuretic.  Patient has history of severe COPD as well followed by Dr. Marchelle Gearing.  He has been having shortness of breath laying down and with minimal activity.  For the past week, wife has been giving him 20 mg daily dosing of torsemide instead of as needed.  I instructed his wife to give him extra dose of torsemide for today and tomorrow.  He has been instructed to call both cardiology service and pulmonology service tomorrow morning to schedule earlier follow-up.  If his condition does not turnaround by this weekend, I would have very low threshold of sending him to the emergency room.

## 2023-11-07 NOTE — Telephone Encounter (Signed)
Complains of 3-4 days of increased cough, shortness of breath, wheezing.  Went to ER [facility] 12/23 - neg for Flu/covid/rsv. CXR neg for PNA per wife.  Tx with steroid shot and extra lasix for couple of days.  Breathing only minimally improved.  Wants rx for steroids.  Stopped smoking  Remains on budesonide/brovana/yupelri  Duoneb prn  Otuvayre rx pending waiting for $ coverage.   Eating, no nv/d/  No hemoptysis or discolored mucus.  No fever    Rec Prednisone 20mg  daily for 1 week , 10mg  daily for 1 week and 5mg  daily for 1 week   If not better needs sooner ov  Please contact office for sooner follow up if symptoms do not improve or worsen or seek emergency care   Advised to call office tomorrow to set up follow up in next few weeks

## 2023-11-08 ENCOUNTER — Encounter: Payer: Self-pay | Admitting: Family Medicine

## 2023-11-08 ENCOUNTER — Ambulatory Visit: Payer: Self-pay | Admitting: Family Medicine

## 2023-11-08 ENCOUNTER — Other Ambulatory Visit: Payer: Self-pay | Admitting: Family Medicine

## 2023-11-08 MED ORDER — LEVOFLOXACIN 750 MG PO TABS: 750.0000 mg | ORAL_TABLET | Freq: Every day | ORAL | 0 refills | Status: DC

## 2023-11-08 NOTE — Telephone Encounter (Signed)
Copied from CRM [id number]. Topic: Clinical - Red Word Triage >> Nov 08, 2023  8:12 AM Prudencio Pair wrote: Red Word that prompted transfer to Nurse Triage: Patient's wife, Don [name], states they were in ED on Monday & pt was told he has COPD & Congestive Heart Failure. Was given IV of steroids. Wants to see about getting antibiotics. Was sent home with no medicine.  Doing breathing treatment.  Chief Complaint: Would like antibiotic for husband Symptoms: CHF and COPD Frequency: na Pertinent Negatives: Patient denies na Disposition: [] ED /[] Urgent Care (no appt availability in office) / [] Appointment(In office/virtual)/ []  [place] [facility]/ [] Home Care/ [] Refused Recommended Disposition /[] [place] [facility]/ []  Follow-up with PCP Additional Notes: Patient was seen in er on Monday and diagnosis was exacerbation of CHF/COPD.  Lasix was increased, received steroids from pulmonary and would like antibiotic as husband is still  not feeling well.  Please call wife back to discuss.  Instructed to take husband back to ER if becomes worse.   Reason for Disposition  [1] Caller has URGENT medicine question about med that PCP or specialist prescribed AND [2] triager unable to answer question  Answer Assessment - Initial Assessment Questions 1. NAME of MEDICINE: "What medicine(s) are you calling about?"      Antibiotic 2. QUESTION: "What is your question?" (e.g., double dose of medicine, side effect)     Was in ER on Monday, IV antibiotic was given.  Protocols used: Medication Question Call-A-AH

## 2023-11-09 ENCOUNTER — Telehealth: Payer: Self-pay | Admitting: Cardiology

## 2023-11-09 ENCOUNTER — Encounter: Payer: Self-pay | Admitting: Cardiology

## 2023-11-09 NOTE — Telephone Encounter (Signed)
Pt c/o Shortness Of Breath: STAT if SOB developed within the last 24 hours or pt is noticeably SOB on the phone  1. Are you currently SOB (can you hear that pt is SOB on the phone)? Yes ay  2. How long have you been experiencing SOB? Monday- went to [facility] ER- increased his Lasix- said if it was not better by Friday to see his Cardiologist  3. Are you SOB when sitting or when up moving around? Both  4.  Are you currently experiencing any other symptoms? No- patient says he needs to be seen by someone today

## 2023-11-09 NOTE — Telephone Encounter (Signed)
Spoke with [name] per DPR and scheduled 11/28/22 as his labs shows normal BNP. [name] verbalized understanding and had no additional questions.

## 2023-11-09 NOTE — Telephone Encounter (Signed)
Left vm for [name] to return call

## 2023-11-12 ENCOUNTER — Ambulatory Visit: Payer: Medicare Other | Admitting: Family Medicine

## 2023-11-13 ENCOUNTER — Encounter: Payer: Self-pay | Admitting: Family Medicine

## 2023-11-13 NOTE — Telephone Encounter (Signed)
Called wife, made patient appointment for 11/19/23

## 2023-11-14 DIAGNOSIS — J441 Chronic obstructive pulmonary disease with (acute) exacerbation: Secondary | ICD-10-CM | POA: Diagnosis not present

## 2023-11-14 DIAGNOSIS — R9431 Abnormal electrocardiogram [ECG] [EKG]: Secondary | ICD-10-CM | POA: Diagnosis not present

## 2023-11-14 DIAGNOSIS — Z79899 Other long term (current) drug therapy: Secondary | ICD-10-CM | POA: Diagnosis not present

## 2023-11-14 DIAGNOSIS — E1165 Type 2 diabetes mellitus with hyperglycemia: Secondary | ICD-10-CM | POA: Diagnosis not present

## 2023-11-14 DIAGNOSIS — R739 Hyperglycemia, unspecified: Secondary | ICD-10-CM | POA: Diagnosis not present

## 2023-11-14 DIAGNOSIS — J440 Chronic obstructive pulmonary disease with acute lower respiratory infection: Secondary | ICD-10-CM | POA: Diagnosis not present

## 2023-11-14 DIAGNOSIS — Z794 Long term (current) use of insulin: Secondary | ICD-10-CM | POA: Diagnosis not present

## 2023-11-14 DIAGNOSIS — J449 Chronic obstructive pulmonary disease, unspecified: Secondary | ICD-10-CM | POA: Diagnosis not present

## 2023-11-14 DIAGNOSIS — R1084 Generalized abdominal pain: Secondary | ICD-10-CM | POA: Diagnosis not present

## 2023-11-14 DIAGNOSIS — Z7984 Long term (current) use of oral hypoglycemic drugs: Secondary | ICD-10-CM | POA: Diagnosis not present

## 2023-11-14 DIAGNOSIS — Z9981 Dependence on supplemental oxygen: Secondary | ICD-10-CM | POA: Diagnosis not present

## 2023-11-14 DIAGNOSIS — J209 Acute bronchitis, unspecified: Secondary | ICD-10-CM | POA: Diagnosis not present

## 2023-11-14 DIAGNOSIS — I4510 Unspecified right bundle-branch block: Secondary | ICD-10-CM | POA: Diagnosis not present

## 2023-11-14 DIAGNOSIS — R0602 Shortness of breath: Secondary | ICD-10-CM | POA: Diagnosis not present

## 2023-11-14 LAB — LAB REPORT - SCANNED
Calcium: 8.7
EGFR: 79

## 2023-11-14 LAB — PROTIME-INR: INR: 1 (ref 0.80–1.20)

## 2023-11-15 ENCOUNTER — Other Ambulatory Visit: Payer: Self-pay | Admitting: Cardiology

## 2023-11-15 ENCOUNTER — Encounter: Payer: Self-pay | Admitting: *Deleted

## 2023-11-15 ENCOUNTER — Other Ambulatory Visit: Payer: Self-pay | Admitting: Primary Care

## 2023-11-17 DIAGNOSIS — J9610 Chronic respiratory failure, unspecified whether with hypoxia or hypercapnia: Secondary | ICD-10-CM | POA: Diagnosis not present

## 2023-11-17 DIAGNOSIS — J449 Chronic obstructive pulmonary disease, unspecified: Secondary | ICD-10-CM | POA: Diagnosis not present

## 2023-11-19 ENCOUNTER — Inpatient Hospital Stay: Payer: Medicare Other | Admitting: Family Medicine

## 2023-11-20 ENCOUNTER — Other Ambulatory Visit: Payer: Self-pay | Admitting: Cardiology

## 2023-11-20 ENCOUNTER — Telehealth: Payer: Self-pay | Admitting: Internal Medicine

## 2023-11-20 ENCOUNTER — Other Ambulatory Visit: Payer: Self-pay | Admitting: Family Medicine

## 2023-11-20 ENCOUNTER — Encounter: Payer: Self-pay | Admitting: Cardiology

## 2023-11-20 MED ORDER — OXYCODONE-ACETAMINOPHEN 5-325 MG PO TABS: 1.0000 | ORAL_TABLET | Freq: Four times a day (QID) | ORAL | 0 refills | Status: DC | PRN

## 2023-11-20 NOTE — Telephone Encounter (Signed)
 Am planning to add December 10, 2023 Monday morning.  The schedules are always packed in January and February simply because of the surgeon illnesses and also working through the backlog from the holidays  I am sending Con an email to December 10, 2023 Monday morning.  W\

## 2023-11-20 NOTE — Telephone Encounter (Signed)
 PT was in the ER twice. They did not want to admit him. They did a CT scan [facility]). They did not say pneumonia but she questions that, thinking it may be pneumonia.Please see last tel encounter also when he was struggling. They are doing breathing treatments. Wife would like him to be seen for a HFU ASAP. No appts avail. Can we double book?  Her # is [phone]  Mucous thick Struggles to Breath

## 2023-11-20 NOTE — Telephone Encounter (Signed)
 Scheduled to see MR on 1/27. Nothing further needed.

## 2023-11-20 NOTE — Telephone Encounter (Signed)
 And I did visualize the CT scan done at [facility] on New Year's Day.  There is no pneumonia and I agree with that.  There is no cancer.  There is some "tree-in-bud" infiltrates these are small.  I will explain during visit

## 2023-11-21 ENCOUNTER — Encounter: Payer: Self-pay | Admitting: Internal Medicine

## 2023-11-21 ENCOUNTER — Other Ambulatory Visit: Payer: Self-pay

## 2023-11-21 MED ORDER — METOPROLOL SUCCINATE ER 100 MG PO TB24: 100.0000 mg | ORAL_TABLET | Freq: Every day | ORAL | 2 refills | Status: DC

## 2023-11-22 NOTE — Telephone Encounter (Signed)
 Pt was last seen 09-18-23. The lov stated she was to stop this rx due to it not helping her. Pt is requesting refill. Please advise.

## 2023-11-23 NOTE — Telephone Encounter (Signed)
 Yes Dr. Marchelle Gearing did stop this. If patient does feel it was actually helping prevent flare please ask MR if we can refill

## 2023-11-24 NOTE — Telephone Encounter (Signed)
 Please see mychart messages sent and advise.  Next opening for an appt with any provider is not until 1/21.

## 2023-11-26 ENCOUNTER — Inpatient Hospital Stay: Payer: Medicare Other | Admitting: Family Medicine

## 2023-11-26 NOTE — Telephone Encounter (Signed)
 Dr Monica Becton,   Please see notes on this pt. She is requesting refills, but it was stated that this rx does not help. Do you want to grant a refill?

## 2023-11-26 NOTE — Telephone Encounter (Signed)
**Note De-identified  [place] Obfuscation** Please advise 

## 2023-11-26 NOTE — Telephone Encounter (Signed)
 He is on 9am on 12/10/23.  See if he can be squeezed in Friday morning 11/30/23. Work wit scheduling -ssending it to scheduling if th is very important for thme

## 2023-11-27 ENCOUNTER — Encounter: Payer: Self-pay | Admitting: Cardiology

## 2023-11-27 NOTE — Progress Notes (Deleted)
Cardiology Office Note:    Date:  11/27/2023   ID:  Don [name], DOB 04/13/1951, MRN [id number]  PCP:  Blane Ohara, MD  Cardiologist:  Norman Herrlich, MD    Referring MD: Blane Ohara, MD    ASSESSMENT:    No diagnosis found. PLAN:    In order of problems listed above:  ***   Next appointment: ***   Medication Adjustments/Labs and Tests Ordered: Current medicines are reviewed at length with the patient today.  Concerns regarding medicines are outlined above.  No orders of the defined types were placed in this encounter.  No orders of the defined types were placed in this encounter.    History of Present Illness:    Don [name] is a 73 y.o. male with a hx of frequent PVCs right bundle branch block previous mild LV dysfunction EF 45 to 50% CAD hypertension hyperlipidemia type 2 diabetes and Polly neuropathy last seen 09/14/2023.  Seen by EP and was intolerant of antiarrhythmic drug therapy for his frequent PVCs and high burden with both amiodarone and mexiletine and hesitant about doing EP ablation because of his COPD.  His PVC frequency was 33.6%.  His most recent echocardiogram from 07/24/2023 showed a low normal EF of 50 to 55% normal right heart and normal pulmonary artery pressure.  He was referred to [facility] for second opinion about treatment of his ventricular arrhythmia it appears he was hesitantly advised to have PVC ablation at the family asked to give a great deal of thought to his increased risk.  Compliance with diet, lifestyle and medications: ***  He is seen at [facility] health ED 11/04/2023 his presenting complaint was shortness of breath.  He was described as sweating profusely nonproductive cough with oxygen saturation of 78% at home.  In the ED his saturation is 93% respiratory rate 25 afebrile heart rate 88 blood pressure 145/67.  Laboratory studies showed elevated white count 18,200 hemoglobin 14.3 creatinine 0.8 potassium 4.4.  His EKG was  described as sinus rhythm right bundle branch block and PVCs.  There is no description of the chest x-ray or a BNP level.  He was advised admission to the hospital and opted for discharge. Past Medical History:  Diagnosis Date   Absence of bladder continence 01/08/2022   Acute cough 07/07/2023   Acute infection of nasal sinus 01/13/2023   Anxiety state 02/02/2022   Arthritis    Asymptomatic LV dysfunction 10/18/2017   Atherosclerosis of aorta (HCC) 01/13/2023   Atypical chest pain 11/09/2022   B12 deficiency anemia 08/25/2021   Basal cell carcinoma    Benign prostatic hyperplasia with incomplete bladder emptying 01/14/2019   Bone pain 03/08/2020   Cancer (HCC)    throat - 1997, throat - 2018   Cardiomyopathy, secondary (HCC) 12/01/2016   Overview:  EF 47% 12/26/16   Chest pain syndrome 11/02/2017   Chronic coronary artery disease 10/18/2017   Chronic ischemic heart disease 11/13/1998   Jan 22, 2003 Entered By: Aris Lot J Comment:  massive Mi in 2000 per patient hsitory, 2nd Mi in 2001Mar 11, 2004 Entered By: Aris Lot J Comment: Had stent placedin 2000,cath/angio in 2001   Chronic neck pain 05/16/2022   Chronic pain syndrome 06/04/2022   Chronic respiratory failure with hypoxia (HCC) 05/16/2022   CKD (chronic kidney disease) stage 3, GFR 30-59 ml/min (HCC) 07/25/2019   COPD (chronic obstructive pulmonary disease) (HCC)    COPD exacerbation (HCC) 07/30/2023   Coronary artery disease involving native coronary artery of native  heart with angina pectoris (HCC) 12/01/2016   Overview:  He has hx of IWMI in remote past, last cath in 2012 at [facility] showed chronic total occlusion of previously stented RCA, with good collaterals, a 40% LAD stenosis, inferior hypokinesis and EF 45%.    He's been lost to Cardiology f/u since 2015, but has not had any recurrent events   Deficiency anemia 08/25/2021   Diabetic polyneuropathy associated with type 2 diabetes mellitus (HCC) 05/16/2022    Dyslipidemia 12/01/2016   Encounter for Medicare annual wellness exam 01/13/2023   Erectile dysfunction due to diseases classified elsewhere 06/12/2019   GERD (gastroesophageal reflux disease)    Hallucination 10/08/2022   Hyperlipidemia    Hypertension    Hypertensive heart disease with heart failure (HCC)    Insomnia 01/28/2021   Iron deficiency 07/07/2023   Iron deficiency anemia due to chronic blood loss 08/25/2021   Lesion of vocal cord    Leukoplakia of larynx 11/15/2016   Long-term use of aspirin therapy 10/02/2018   Lumbar back pain 06/04/2022   Lung nodules 08/17/2022   Malfunction of penile prosthesis (HCC) 01/14/2019   Malignant neoplasm of skin 01/28/2021   Formatting of this note might be different from the original. Jan 22, 2003 Entered By: Aris Lot J Comment: of skin - removed x2 inpast Jan 22, 2003 Entered By: Aris Lot J Comment: of skin - removed x2 inpast   Melanoma of back (HCC)    melanoma on back   Memory loss 03/08/2020   Moderate recurrent major depression (HCC) 03/02/2023   Mucopurulent chronic bronchitis (HCC) 04/08/2023   Other ill-defined and unknown causes of morbidity and mortality 11/13/1993   Formatting of this note might be different from the original. Jan 31, 2008 Entered By: ARMOUR,ROSS B Comment: lumbar, 8/08 Jan 22, 2003 Entered By: Aris Lot J Comment: x9yrs in work place  quit 46yrs ago in 2000 Jan 22, 2003 Entered By: Aris Lot J Comment: x36yrs in work place  quit 15yrs ago in 2000 Jan 31, 2008 Entered By: Bennie Pierini B Comment: lumbar, 8/08   PAD (peripheral artery disease) (HCC) 10/18/2017   Peripheral vascular disease (HCC)    iliac artery clot   Peyronie's disease 06/12/2019   Squamous cell carcinoma of larynx (HCC) 12/13/2016   Throat cancer (HCC)    Tobacco use disorder 06/12/2019   Type 2 diabetes mellitus with hyperglycemia, with long-term current use of insulin (HCC) 07/30/2023   URI, acute 05/31/2023   Urinary  urgency 04/08/2023   Ventricular ectopy 11/09/2022   Vertigo 04/08/2023   Vitamin D deficiency 01/13/2023    Current Medications: No outpatient medications have been marked as taking for the 11/29/23 encounter (Appointment) with Baldo Daub, MD.      EKGs/Labs/Other Studies Reviewed:    The following studies were reviewed today:  Cardiac Studies & Procedures     STRESS TESTS  MYOCARDIAL PERFUSION IMAGING 05/24/2022  Narrative   The study is normal. The study is intermediate risk.   LV perfusion is abnormal. There is no evidence of ischemia. There is evidence of infarction. Defect 1: There is a medium defect with moderate reduction in uptake present in the inferior location(s) that is fixed. There is abnormal wall motion in the defect area. Consistent with infarction.   Left ventricular function is abnormal. Global function is mildly reduced. Nuclear stress EF: 49 %. The left ventricular ejection fraction is mildly decreased (45-54%). End diastolic cavity size is normal.   Prior study not available for comparison.  ECHOCARDIOGRAM  ECHOCARDIOGRAM COMPLETE 07/24/2023  Narrative ECHOCARDIOGRAM REPORT    Patient Name:   Don [name] Date of Exam: 07/24/2023 Medical Rec #:  [id number]          Height:       70.0 in Accession #:    [id number]         Weight:       197.0 lb Date of Birth:  [birth year]          BSA:          2.074 m Patient Age:    71 years           BP:           124/56 mmHg Patient Gender: M                  HR:           72 bpm. Exam Location:  [place]  Procedure: 2D Echo, Cardiac Doppler, Color Doppler and Intracardiac Opacification Agent  Indications:    Frequent PVCs [I49.3 (ICD-10-CM)]; RBBB (right bundle branch block with left anterior fascicular block) [I45.2 (ICD-10-CM)]; Coronary artery disease involving native coronary artery of native heart without angina pectoris [I25.10 (ICD-10-CM)]; Essential hypertension [I10 (ICD-10-CM)]; Mixed  hyperlipidemia [E78.2 (ICD-10-CM)]  History:        Patient has prior history of Echocardiogram examinations, most recent 07/25/2022.  Sonographer:    Louie Boston RDCS Referring Phys: [id number] Baldo Daub   Sonographer Comments: Technically challenging study due to limited acoustic windows. Global longitudinal strain was attempted. IMPRESSIONS   1. Left ventricular ejection fraction, by estimation, is 50 to 55%. The left ventricle has low normal function. The left ventricle demonstrates global hypokinesis. Left ventricular diastolic parameters are indeterminate. 2. Right ventricular systolic function is normal. The right ventricular size is normal. There is normal pulmonary artery systolic pressure. 3. Trace to small pericardial effusion anteriorlly. There is no evidence of cardiac tamponade. 4. The mitral valve was not well visualized. Trivial mitral valve regurgitation. No evidence of mitral stenosis. 5. The aortic valve is tricuspid. Aortic valve regurgitation is not visualized. No aortic stenosis is present. 6. There is borderline dilatation of the ascending aorta, measuring 36 mm. 7. The inferior vena cava is normal in size with greater than 50% respiratory variability, suggesting right atrial pressure of 3 mmHg.  Comparison(s): A prior study was performed on 06/03/2021. LVEF was 55-60% and small pericardial effusion and fat pad reported.  FINDINGS Left Ventricle: Left ventricular ejection fraction, by estimation, is 50 to 55%. The left ventricle has low normal function. The left ventricle demonstrates global hypokinesis. Definity contrast agent was given IV to delineate the left ventricular endocardial borders. The left ventricular internal cavity size was normal in size. There is no left ventricular hypertrophy. Left ventricular diastolic parameters are indeterminate.  Right Ventricle: The right ventricular size is normal. Right vetricular wall thickness was not well visualized.  Right ventricular systolic function is normal. There is normal pulmonary artery systolic pressure. The tricuspid regurgitant velocity is 2.41 m/s, and with an assumed right atrial pressure of 3 mmHg, the estimated right ventricular systolic pressure is 26.2 mmHg.  Left Atrium: Left atrial size was not well visualized.  Right Atrium: Right atrial size was not well visualized.  Pericardium: Trivial pericardial effusion is present. The pericardial effusion is localized near the right atrium and anterior to the right ventricle. There is no evidence of cardiac tamponade. Presence of epicardial fat layer.  Mitral Valve: The mitral  valve was not well visualized. Trivial mitral valve regurgitation. No evidence of mitral valve stenosis.  Tricuspid Valve: The tricuspid valve is not well visualized. Tricuspid valve regurgitation is trivial.  Aortic Valve: The aortic valve is tricuspid. Aortic valve regurgitation is not visualized. No aortic stenosis is present.  Pulmonic Valve: The pulmonic valve was not well visualized. Pulmonic valve regurgitation is not visualized.  Aorta: The aortic root is normal in size and structure. There is borderline dilatation of the ascending aorta, measuring 36 mm.  Venous: The inferior vena cava is normal in size with greater than 50% respiratory variability, suggesting right atrial pressure of 3 mmHg.  IAS/Shunts: The interatrial septum was not well visualized.   LEFT VENTRICLE PLAX 2D LVIDd:         5.70 cm     Diastology LVIDs:         3.80 cm     LV e' medial:    5.55 cm/s LV PW:         1.10 cm     LV E/e' medial:  9.1 LV IVS:        1.10 cm     LV e' lateral:   6.64 cm/s LVOT diam:     2.10 cm     LV E/e' lateral: 7.6 LV SV:         64 LV SV Index:   31 LVOT Area:     3.46 cm  LV Volumes (MOD) LV vol d, MOD A2C: 74.9 ml LV vol d, MOD A4C: 89.2 ml LV vol s, MOD A2C: 40.0 ml LV vol s, MOD A4C: 41.8 ml LV SV MOD A2C:     34.9 ml LV SV MOD A4C:     89.2  ml LV SV MOD BP:      42.8 ml  RIGHT VENTRICLE            IVC RV Basal diam:  2.60 cm    IVC diam: 1.60 cm RV S prime:     9.90 cm/s TAPSE (M-mode): 1.6 cm  LEFT ATRIUM             Index        RIGHT ATRIUM           Index LA diam:        3.70 cm 1.78 cm/m   RA Area:     12.40 cm LA Vol (A2C):   52.8 ml 25.46 ml/m  RA Volume:   30.00 ml  14.47 ml/m LA Vol (A4C):   49.3 ml 23.77 ml/m LA Biplane Vol: 51.4 ml 24.78 ml/m AORTIC VALVE LVOT Vmax:   85.95 cm/s LVOT Vmean:  58.450 cm/s LVOT VTI:    0.184 m  AORTA Ao Root diam: 3.60 cm Ao Asc diam:  3.60 cm Ao Desc diam: 2.90 cm  MV E velocity: 50.30 cm/s   TRICUSPID VALVE MV A velocity: 105.00 cm/s  TR Peak grad:   23.2 mmHg MV E/A ratio:  0.48         TR Vmax:        241.00 cm/s  SHUNTS Systemic VTI:  0.18 m Systemic Diam: 2.10 cm  Sreedhar reddy Madireddy Electronically signed by Vern Claude reddy Madireddy Signature Date/Time: 07/24/2023/5:30:35 PM    Final   MONITORS  LONG TERM MONITOR (3-14 DAYS) 07/18/2023  Narrative Patch Wear Time:  2 days and 10 hours  Patient had a min HR of 63 bpm, max HR of 86 bpm, and avg HR of  72 bpm. Predominant underlying rhythm was Sinus Rhythm. 33.6% ventricular ectopy Less than 1% supraventricular ectopy Patient triggered episodes associated with sinus rhythm and PVCs  Will Camnitz, MD               Recent Labs: 05/31/2023: Magnesium 1.6; TSH 2.200 07/06/2023: ALT 13; BUN 16; Creatinine, Ser 1.12; Hemoglobin 11.9; NT-Pro BNP 665; Platelets 214; Potassium 4.3; Sodium 144  Recent Lipid Panel    Component Value Date/Time   CHOL 119 07/06/2023 1147   TRIG 297 (H) 07/06/2023 1147   HDL 27 (L) 07/06/2023 1147   CHOLHDL 4.4 07/06/2023 1147   LDLCALC 46 07/06/2023 1147    Physical Exam:    VS:  There were no vitals taken for this visit.    Wt Readings from Last 3 Encounters:  09/18/23 195 lb 9.6 oz (88.7 kg)  09/14/23 193 lb (87.5 kg)  07/30/23 192 lb (87.1 kg)     GEN:  *** Well nourished, well developed in no acute distress HEENT: Normal NECK: No JVD; No carotid bruits LYMPHATICS: No lymphadenopathy CARDIAC: ***RRR, no murmurs, rubs, gallops RESPIRATORY:  Clear to auscultation without rales, wheezing or rhonchi  ABDOMEN: Soft, non-tender, non-distended MUSCULOSKELETAL:  No edema; No deformity  SKIN: Warm and dry NEUROLOGIC:  Alert and oriented x 3 PSYCHIATRIC:  Normal affect    Signed, Norman Herrlich, MD  11/27/2023 12:30 PM    [facility]

## 2023-11-27 NOTE — Telephone Encounter (Signed)
 [facility]amil has indicated they will kee 27th Jan. Let us go with that

## 2023-11-29 ENCOUNTER — Encounter: Payer: Self-pay | Admitting: Cardiology

## 2023-11-29 ENCOUNTER — Encounter: Payer: Self-pay | Admitting: Physician Assistant

## 2023-11-29 ENCOUNTER — Ambulatory Visit (INDEPENDENT_AMBULATORY_CARE_PROVIDER_SITE_OTHER): Payer: Medicare Other | Admitting: Physician Assistant

## 2023-11-29 ENCOUNTER — Ambulatory Visit: Payer: Medicare Other | Admitting: Cardiology

## 2023-11-29 ENCOUNTER — Other Ambulatory Visit: Payer: Self-pay | Admitting: Family Medicine

## 2023-11-29 ENCOUNTER — Other Ambulatory Visit: Payer: Self-pay | Admitting: Physician Assistant

## 2023-11-29 VITALS — BP 110/68 | HR 70 | Temp 98.8°F | Ht 70.0 in | Wt 201.4 lb

## 2023-11-29 DIAGNOSIS — R911 Solitary pulmonary nodule: Secondary | ICD-10-CM

## 2023-11-29 DIAGNOSIS — J9611 Chronic respiratory failure with hypoxia: Secondary | ICD-10-CM | POA: Diagnosis not present

## 2023-11-29 DIAGNOSIS — J441 Chronic obstructive pulmonary disease with (acute) exacerbation: Secondary | ICD-10-CM | POA: Diagnosis not present

## 2023-11-29 MED ORDER — LEVOFLOXACIN 750 MG PO TABS: 750.0000 mg | ORAL_TABLET | Freq: Every day | ORAL | 0 refills | Status: DC

## 2023-11-29 MED ORDER — CLARITHROMYCIN 500 MG PO TABS: 500.0000 mg | ORAL_TABLET | Freq: Two times a day (BID) | ORAL | 0 refills | Status: DC

## 2023-11-29 MED ORDER — PREDNISONE 20 MG PO TABS: ORAL_TABLET | ORAL | 0 refills | Status: DC

## 2023-11-29 MED ORDER — TRIAMCINOLONE ACETONIDE 40 MG/ML IJ SUSP
80.0000 mg | Freq: Once | INTRAMUSCULAR | Status: AC
  Administered 2023-11-29: 80 mg via INTRAMUSCULAR

## 2023-11-29 MED ORDER — CEFTRIAXONE SODIUM 1 G IJ SOLR
1.0000 g | Freq: Once | INTRAMUSCULAR | Status: AC
  Administered 2023-11-29: 1 g via INTRAMUSCULAR

## 2023-11-29 MED ORDER — ALPRAZOLAM 0.25 MG PO TABS: 0.2500 mg | ORAL_TABLET | Freq: Two times a day (BID) | ORAL | 1 refills | Status: DC | PRN

## 2023-11-29 NOTE — Progress Notes (Deleted)
Acute Office Visit  Subjective:    Patient ID: Don [name], male    DOB: [birth year], 73 y.o.   MRN: [id number]  No chief complaint on file.   Discussed the use of AI scribe software for clinical note transcription with the patient, who gave verbal consent to proceed.  History of Present Illness            HPI: Patient is in today for ***  SALLY, PREVO CALLED AND CLARITHROMYCIN IS CAUTIONED WITH XANAX AND OXYCODONE. IT DECREASES METABOLISM INCREASING SIDE EFFECTS. I TOLD HIM TO JUST GIVEN HIM LEVAQUIN 750 MG DAILY X 10 DAYS (AGAIN) AND CANCELLED THE CLARITHROMYCIN.   Past Medical History:  Diagnosis Date   Absence of bladder continence 01/08/2022   Acute cough 07/07/2023   Acute infection of nasal sinus 01/13/2023   Anxiety state 02/02/2022   Arthritis    Asymptomatic LV dysfunction 10/18/2017   Atherosclerosis of aorta (HCC) 01/13/2023   Atypical chest pain 11/09/2022   B12 deficiency anemia 08/25/2021   Basal cell carcinoma    Benign prostatic hyperplasia with incomplete bladder emptying 01/14/2019   Bone pain 03/08/2020   Cancer (HCC)    throat - 1997, throat - 2018   Cardiomyopathy, secondary (HCC) 12/01/2016   Overview:  EF 47% 12/26/16   Chest pain syndrome 11/02/2017   Chronic coronary artery disease 10/18/2017   Chronic ischemic heart disease 11/13/1998   Jan 22, 2003 Entered By: Aris Lot J Comment:  massive Mi in 2000 per patient hsitory, 2nd Mi in 2001Mar 11, 2004 Entered By: Aris Lot J Comment: Had stent placedin 2000,cath/angio in 2001   Chronic neck pain 05/16/2022   Chronic pain syndrome 06/04/2022   Chronic respiratory failure with hypoxia (HCC) 05/16/2022   CKD (chronic kidney disease) stage 3, GFR 30-59 ml/min (HCC) 07/25/2019   COPD (chronic obstructive pulmonary disease) (HCC)    COPD exacerbation (HCC) 07/30/2023   Coronary artery disease involving native coronary artery of native heart with angina pectoris (HCC) 12/01/2016    Overview:  He has hx of IWMI in remote past, last cath in 2012 at [facility] showed chronic total occlusion of previously stented RCA, with good collaterals, a 40% LAD stenosis, inferior hypokinesis and EF 45%.    He's been lost to Cardiology f/u since 2015, but has not had any recurrent events   Deficiency anemia 08/25/2021   Diabetic polyneuropathy associated with type 2 diabetes mellitus (HCC) 05/16/2022   Dyslipidemia 12/01/2016   Encounter for Medicare annual wellness exam 01/13/2023   Erectile dysfunction due to diseases classified elsewhere 06/12/2019   GERD (gastroesophageal reflux disease)    Hallucination 10/08/2022   Hyperlipidemia    Hypertension    Hypertensive heart disease with heart failure (HCC)    Insomnia 01/28/2021   Iron deficiency 07/07/2023   Iron deficiency anemia due to chronic blood loss 08/25/2021   Lesion of vocal cord    Leukoplakia of larynx 11/15/2016   Long-term use of aspirin therapy 10/02/2018   Lumbar back pain 06/04/2022   Lung nodules 08/17/2022   Malfunction of penile prosthesis (HCC) 01/14/2019   Malignant neoplasm of skin 01/28/2021   Formatting of this note might be different from the original. Jan 22, 2003 Entered By: Aris Lot J Comment: of skin - removed x2 inpast Jan 22, 2003 Entered By: Aris Lot J Comment: of skin - removed x2 inpast   Melanoma of back (HCC)    melanoma on back   Memory loss 03/08/2020   Moderate recurrent major depression (  HCC) 03/02/2023   Mucopurulent chronic bronchitis (HCC) 04/08/2023   Other ill-defined and unknown causes of morbidity and mortality 11/13/1993   Formatting of this note might be different from the original. Jan 31, 2008 Entered By: ARMOUR,ROSS B Comment: lumbar, 8/08 Jan 22, 2003 Entered By: Aris Lot J Comment: x54yrs in work place  quit 32yrs ago in 2000 Jan 22, 2003 Entered By: Aris Lot J Comment: x79yrs in work place  quit 24yrs ago in 2000 Jan 31, 2008 Entered By: Bennie Pierini B Comment:  lumbar, 8/08   PAD (peripheral artery disease) (HCC) 10/18/2017   Peripheral vascular disease (HCC)    iliac artery clot   Peyronie's disease 06/12/2019   PVC (premature ventricular contraction) 10/09/2023   RBBB 10/09/2023   Squamous cell carcinoma of larynx (HCC) 12/13/2016   Throat cancer (HCC)    Tobacco use disorder 06/12/2019   Type 2 diabetes mellitus with hyperglycemia, with long-term current use of insulin (HCC) 07/30/2023   URI, acute 05/31/2023   Urinary urgency 04/08/2023   Ventricular ectopy 11/09/2022   Vertigo 04/08/2023   Vitamin D deficiency 01/13/2023    Past Surgical History:  Procedure Laterality Date   ABDOMINAL ANGIOGRAM N/A 01/13/2015   Procedure: ABDOMINAL ANGIOGRAM;  Surgeon: Larina Earthly, MD;  Location: [facility] CATH LAB;  Service: Cardiovascular;  Laterality: N/A;   APPENDECTOMY     BACK SURGERY     CARDIAC CATHETERIZATION  2000/2012   with stents in 2000   CHOLECYSTECTOMY     COLONOSCOPY     ELBOW SURGERY     bilateral   ESOPHAGOGASTRODUODENOSCOPY     KNEE SURGERY Left    MICROLARYNGOSCOPY WITH CO2 LASER AND EXCISION OF VOCAL CORD LESION N/A 11/23/2016   Procedure: MICROLARYNGOSCOPY  AND EXCISION OF VOCAL CORD LESION;  Surgeon: Suzanna Obey, MD;  Location: [facility] OR;  Service: ENT;  Laterality: N/A;   MICROLARYNGOSCOPY WITH CO2 LASER AND EXCISION OF VOCAL CORD LESION N/A 01/11/2017   Procedure: MICROLARYNGOSCOPY WITH CO2 LASER AND EXCISION OF VOCAL CORD LESION;  Surgeon: Suzanna Obey, MD;  Location[facility] OR;  Service: ENT;  Laterality: N/A;   MICROLARYNGOSCOPY WITH LASER N/A 03/16/2015   Procedure: MICROLARYNGOSCOPY ;  Surgeon: Suzanna Obey, MD;  Location: [facility] OR;  Service: ENT;  Laterality: N/A;   peyronie's surgery     SHOULDER SURGERY Right    rotator cuff   SPINE SURGERY  09/2020   cervical surgery. steel plate, bone spurs, allograft.   THROAT SURGERY  1997   cancer removed    Family History  Problem Relation Age of Onset   Cancer Mother        bone    Hypertension Mother    Stroke Father    Hypertension Father    Healthy Child    Cancer Other        brain   Diabetes Other     Social History   Socioeconomic History   Marital status: Married    Spouse name: Not on file   Number of children: 4   Years of education: Not on file   Highest education level: GED or equivalent  Occupational History   Occupation: retired  Tobacco Use   Smoking status: Every Day    Current packs/day: 1.00    Average packs/day: 1 pack/day for 50.0 years (50.0 ttl pk-yrs)    Types: Cigarettes   Smokeless tobacco: Never   Tobacco comments:    down to .5 ppd  11/02/2022 hfb  Vaping Use   Vaping status:  Former  Substance and Sexual Activity   Alcohol use: Not on file   Drug use: No   Sexual activity: Not on file  Other Topics Concern   Not on file  Social History Narrative   Lives with wife       One level      Right hand   Social Drivers of Health   Financial Resource Strain: Low Risk  (11/12/2023)   Overall Financial Resource Strain (CARDIA)    Difficulty of Paying Living Expenses: Not hard at all  Food Insecurity: No Food Insecurity (11/12/2023)   Hunger Vital Sign    Worried About Running Out of Food in the Last Year: Never true    Ran Out of Food in the Last Year: Never true  Transportation Needs: No Transportation Needs (11/12/2023)   PRAPARE - Administrator, Civil Service (Medical): No    Lack of Transportation (Non-Medical): No  Physical Activity: Inactive (11/12/2023)   Exercise Vital Sign    Days of Exercise per Week: 0 days    Minutes of Exercise per Session: 30 min  Stress: No Stress Concern Present (11/12/2023)   [employer] of Occupational Health - Occupational Stress Questionnaire    Feeling of Stress : Only a little  Social Connections: Socially Isolated (11/12/2023)   Social Connection and Isolation Panel [NHANES]    Frequency of Communication with Friends and Family: Once a week    Frequency of  Social Gatherings with Friends and Family: Never    Attends Religious Services: Never    Database administrator or Organizations: No    Attends Banker Meetings: Never    Marital Status: Married  Catering manager Violence: Not At Risk (01/11/2023)   Humiliation, Afraid, Rape, and Kick questionnaire    Fear of Current or Ex-Partner: No    Emotionally Abused: No    Physically Abused: No    Sexually Abused: No    Outpatient Medications Prior to Visit  Medication Sig Dispense Refill   arformoterol (BROVANA) 15 MCG/2ML NEBU Take 2 mLs (15 mcg total) by nebulization 2 (two) times daily. 120 mL 6   azithromycin (ZITHROMAX) 250 MG tablet Take 250 mg by mouth every other day. Monday Wedneday Friday     budesonide (PULMICORT) 0.25 MG/2ML nebulizer solution Take 2 mLs (0.25 mg total) by nebulization in the morning and at bedtime. 60 mL 11   clopidogrel (PLAVIX) 75 MG tablet Take 1 tablet by mouth once daily 90 tablet 0   cyanocobalamin (VITAMIN B12) 1000 MCG/ML injection INJECT 1 ML (CC) INTRAMUSCULARLY ONCE A WEEK (Patient taking differently: Inject 100 mcg into the muscle every 30 (thirty) days.) 10 mL 0   cyclobenzaprine (FLEXERIL) 10 MG tablet Take 1 tablet (10 mg total) by mouth 3 (three) times daily as needed for muscle spasms. 90 tablet 1   DULoxetine (CYMBALTA) 60 MG capsule Take 1 capsule (60 mg total) by mouth 2 (two) times daily. 60 capsule 3   Dupilumab (DUPIXENT) 300 MG/2ML SOAJ Inject 300 mg into the skin every 14 (fourteen) days. 12 mL 1   fenofibrate 160 MG tablet Take 1 tablet by mouth once daily 90 tablet 0   finasteride (PROSCAR) 5 MG tablet Take 1 tablet by mouth once daily (Patient taking differently: Take 5 mg by mouth daily.) 90 tablet 0   furosemide (LASIX) 20 MG tablet Take 20 mg by mouth daily.     gabapentin (NEURONTIN) 400 MG capsule TAKE 1 CAPSULE BY MOUTH  THREE TIMES DAILY 270 capsule 1   insulin lispro (HUMALOG) 100 UNIT/ML injection Inject 0.04 mLs (4 Units  total) into the skin 3 (three) times daily with meals. 10 mL 11   ipratropium-albuterol (DUONEB) 0.5-2.5 (3) MG/3ML SOLN USE 1 AMPULE IN NEBULIZER 4 TIMES DAILY (Patient taking differently: Take 3 mLs by nebulization every 6 (six) hours as needed (sob). USE 1 AMPULE IN NEBULIZER 4 TIMES DAILY) 360 mL 1   Lancets (ONETOUCH DELICA PLUS LANCET33G) MISC USE 1  TO CHECK GLUCOSE THREE TIMES DAILY (Patient taking differently: 1 each by Other route 3 (three) times daily.) 100 each 0   levothyroxine (SYNTHROID) 25 MCG tablet Take 1 tablet by mouth once daily 90 tablet 0   losartan (COZAAR) 50 MG tablet Take 1 tablet (50 mg total) by mouth daily. 90 tablet 3   MAG64 64 MG TBEC Take 1 tablet by mouth daily.     magnesium chloride (SLOW-MAG) 64 MG TBEC SR tablet Take 1 tablet (64 mg total) by mouth daily. 90 tablet 3   metFORMIN (GLUCOPHAGE) 1000 MG tablet TAKE 1 TABLET BY MOUTH TWICE DAILY WITH MEALS 180 tablet 0   metoprolol succinate (TOPROL-XL) 100 MG 24 hr tablet Take 1 tablet (100 mg total) by mouth daily. Take with or immediately following a meal. 90 tablet 2   MYRBETRIQ 25 MG TB24 tablet Take 1 tablet by mouth once daily (Patient taking differently: Take 25 mg by mouth daily.) 30 tablet 0   nitroGLYCERIN (NITROSTAT) 0.4 MG SL tablet DISSOLVE ONE TABLET UNDER THE TONGUE EVERY 5 TO 10 MINUTES PRIOR TO ACTIVITIES WHICH MIGHT PRECIPITATE AN ATTACK (Patient taking differently: Place 0.4 mg under the tongue every 5 (five) minutes as needed for chest pain.) 25 tablet 0   nystatin cream (MYCOSTATIN) Apply 1 application topically 2 (two) times daily as needed for dry skin.     Omega-3 Fatty Acids (FISH OIL) 1000 MG CAPS Take 2 capsules (2,000 mg total) by mouth in the morning and at bedtime. 180 capsule 12   omeprazole (PRILOSEC) 20 MG capsule Take 1 capsule by mouth once daily 90 capsule 0   ONETOUCH ULTRA TEST test strip USE 1 STRIP TO CHECK GLUCOSE IN THE MORNING AND 1 AT BEDTIME AS DIRECTED (Patient taking  differently: 1 each by Other route as needed for other (GLucose check).) 100 each 0   oxyCODONE-acetaminophen (PERCOCET/ROXICET) 5-325 MG tablet Take 1 tablet by mouth every 6 (six) hours as needed for severe pain (pain score 7-10). 120 tablet 0   predniSONE (DELTASONE) 20 MG tablet 1 po tid for 3 days then 1 po bid for 3 days then 1 po qd for 3 days 18 tablet 0   revefenacin (YUPELRI) 175 MCG/3ML nebulizer solution Take 3 mLs (175 mcg total) by nebulization daily. 90 mL 11   rosuvastatin (CRESTOR) 40 MG tablet Take 1 tablet by mouth once daily (Patient taking differently: Take 40 mg by mouth daily.) 90 tablet 0   spironolactone (ALDACTONE) 25 MG tablet Take 1 tablet (25 mg total) by mouth daily. 90 tablet 3   tamsulosin (FLOMAX) 0.4 MG CAPS capsule TAKE 2 CAPSULES BY MOUTH ONCE DAILY AFTER SUPPER (Patient taking differently: Take 0.8 mg by mouth daily after supper.) 180 capsule 0   torsemide (DEMADEX) 20 MG tablet Take 1 tablet (20 mg total) by mouth as needed (edema). 30 tablet 0   TOUJEO SOLOSTAR 300 UNIT/ML Solostar Pen INJECT 30 UNITS SUBCUTANEOUSLY ONCE DAILY 6 mL 0   traZODone (DESYREL)  150 MG tablet Take 1 tablet by mouth once daily 270 tablet 0   VENTOLIN HFA 108 (90 Base) MCG/ACT inhaler INHALE 2 PUFFS BY MOUTH EVERY 6 HOURS AS NEEDED FOR SHORTNESS OF BREATH FOR WHEEZING (Patient taking differently: Inhale 2 puffs into the lungs every 6 (six) hours as needed for shortness of breath or wheezing.) 18 g 0   Vitamin D, Ergocalciferol, (DRISDOL) 1.25 MG (50000 UNIT) CAPS capsule TAKE 1 CAPSULE BY MOUTH TWICE A WEEK (Patient taking differently: Take 50,000 Units by mouth every 7 (seven) days.) 12 capsule 3   ALPRAZolam (XANAX) 0.25 MG tablet TAKE 1 TABLET BY MOUTH ONCE DAILY AS NEEDED FOR ANXIETY 30 tablet 1   clarithromycin (BIAXIN) 500 MG tablet Take 1 tablet (500 mg total) by mouth 2 (two) times daily. 20 tablet 0   No facility-administered medications prior to visit.    Allergies  Allergen  Reactions   Penicillins Rash and Hives    Has patient had a PCN reaction causing immediate rash, facial/tongue/throat swelling, SOB or lightheadedness with hypotension:unsure Has patient had a PCN reaction causing severe rash involving mucus membranes or skin necrosis:unsure Has patient had a PCN reaction that required hospitalization:No Has patient had a PCN reaction occurring within the last 10 years:No If all of the above answers are "NO", then may proceed with Cephalosporin use.  rash   Bactrim [Sulfamethoxazole-Trimethoprim]     Dizziness, confusion, involuntary movements   Doxycycline Rash   Iodinated Contrast Media Rash    Also developed blisters   Penicillin G Rash   Tramadol Rash    Review of Systems     Objective:        11/29/2023    9:32 AM 09/18/2023    1:09 PM 09/14/2023    4:25 PM  Vitals with BMI  Height 5\' 10"  5\' 10"  5\' 10"   Weight 201 lbs 6 oz 195 lbs 10 oz 193 lbs  BMI 28.9 28.07 27.69  Systolic 110 129 657  Diastolic 68 57 50  Pulse 70 74 77    No data found.   Physical Exam  Health Maintenance Due  Topic Date Due   OPHTHALMOLOGY EXAM  Never done   Hepatitis C Screening  Never done   Zoster Vaccines- Shingrix (1 of 2) Never done   Medicare Annual Wellness (AWV)  01/13/2024    There are no preventive care reminders to display for this patient.   Lab Results  Component Value Date   TSH 2.200 05/31/2023   Lab Results  Component Value Date   WBC 11.5 (H) 07/06/2023   HGB 11.9 (L) 07/06/2023   HCT 38.7 07/06/2023   MCV 80 07/06/2023   PLT 214 07/06/2023   Lab Results  Component Value Date   NA 144 07/06/2023   K 4.3 07/06/2023   CO2 26 07/06/2023   GLUCOSE 155 (H) 07/06/2023   BUN 16 07/06/2023   CREATININE 1.12 07/06/2023   BILITOT 0.3 07/06/2023   ALKPHOS 64 07/06/2023   AST 11 07/06/2023   ALT 13 07/06/2023   PROT 6.5 07/06/2023   ALBUMIN 4.1 07/06/2023   CALCIUM 9.0 07/06/2023   ANIONGAP 7 11/10/2022   EGFR 93.0  11/04/2023   GFR 74.13 08/17/2022   Lab Results  Component Value Date   CHOL 119 07/06/2023   Lab Results  Component Value Date   HDL 27 (L) 07/06/2023   Lab Results  Component Value Date   LDLCALC 46 07/06/2023   Lab Results  Component Value  Date   TRIG 297 (H) 07/06/2023   Lab Results  Component Value Date   CHOLHDL 4.4 07/06/2023   Lab Results  Component Value Date   HGBA1C 8.1 (H) 07/06/2023       Assessment & Plan:  There are no diagnoses linked to this encounter.   Meds ordered this encounter  Medications   levofloxacin (LEVAQUIN) 750 MG tablet    Sig: Take 1 tablet (750 mg total) by mouth daily.    Dispense:  10 tablet    Refill:  0   ALPRAZolam (XANAX) 0.25 MG tablet    Sig: Take 1 tablet (0.25 mg total) by mouth 2 (two) times daily as needed for anxiety.    Dispense:  60 tablet    Refill:  1    Cancel previous rxs for xanax.    No orders of the defined types were placed in this encounter.    Follow-up: No follow-ups on file.  An After Visit Summary was printed and given to the patient.  Blane Ohara, MD [facility] [phone]

## 2023-11-29 NOTE — Telephone Encounter (Signed)
Spoke with Dr. Sedalia Muta who stated she will change axbx to levoquin 750 mg daily x 10 days. She is sending the rx over along with increasing patients anxiety medication due to increased anxiety with his shob. Patient wife aware.

## 2023-11-30 ENCOUNTER — Encounter: Payer: Self-pay | Admitting: Physician Assistant

## 2023-11-30 DIAGNOSIS — R911 Solitary pulmonary nodule: Secondary | ICD-10-CM | POA: Insufficient documentation

## 2023-11-30 HISTORY — DX: Solitary pulmonary nodule: R91.1

## 2023-11-30 LAB — CBC WITH DIFFERENTIAL/PLATELET
Basophils Absolute: 0.1 10*3/uL (ref 0.0–0.2)
Basos: 0 %
EOS (ABSOLUTE): 0.3 10*3/uL (ref 0.0–0.4)
Eos: 3 %
Hematocrit: 38.5 % (ref 37.5–51.0)
Hemoglobin: 12.7 g/dL — ABNORMAL LOW (ref 13.0–17.7)
Immature Grans (Abs): 0.1 10*3/uL (ref 0.0–0.1)
Immature Granulocytes: 1 %
Lymphocytes Absolute: 2.6 10*3/uL (ref 0.7–3.1)
Lymphs: 21 %
MCH: 29.4 pg (ref 26.6–33.0)
MCHC: 33 g/dL (ref 31.5–35.7)
MCV: 89 fL (ref 79–97)
Monocytes Absolute: 0.9 10*3/uL (ref 0.1–0.9)
Monocytes: 8 %
Neutrophils Absolute: 8.3 10*3/uL — ABNORMAL HIGH (ref 1.4–7.0)
Neutrophils: 67 %
Platelets: 196 10*3/uL (ref 150–450)
RBC: 4.32 x10E6/uL (ref 4.14–5.80)
RDW: 14 % (ref 11.6–15.4)
WBC: 12.3 10*3/uL — ABNORMAL HIGH (ref 3.4–10.8)

## 2023-11-30 LAB — COMPREHENSIVE METABOLIC PANEL
ALT: 11 [IU]/L (ref 0–44)
AST: 11 [IU]/L (ref 0–40)
Albumin: 3.5 g/dL — ABNORMAL LOW (ref 3.8–4.8)
Alkaline Phosphatase: 80 [IU]/L (ref 44–121)
BUN/Creatinine Ratio: 13 (ref 10–24)
BUN: 9 mg/dL (ref 8–27)
Bilirubin Total: 0.3 mg/dL (ref 0.0–1.2)
CO2: 26 mmol/L (ref 20–29)
Calcium: 8.8 mg/dL (ref 8.6–10.2)
Chloride: 100 mmol/L (ref 96–106)
Creatinine, Ser: 0.72 mg/dL — ABNORMAL LOW (ref 0.76–1.27)
Globulin, Total: 2.4 g/dL (ref 1.5–4.5)
Glucose: 192 mg/dL — ABNORMAL HIGH (ref 70–99)
Potassium: 4.2 mmol/L (ref 3.5–5.2)
Sodium: 140 mmol/L (ref 134–144)
Total Protein: 5.9 g/dL — ABNORMAL LOW (ref 6.0–8.5)
eGFR: 96 mL/min/{1.73_m2} (ref >59)

## 2023-11-30 NOTE — Progress Notes (Addendum)
Subjective:  Patient ID: Don [name], male    DOB: 1951/08/01  Age: 73 y.o. MRN: [id number]  Chief Complaint  Patient presents with   Pneumonia    Hospital follow up   Wife with pt HPI Pt was seen in ED on 11/04/23 for CHF exacerbation and pulmonary edema.  Per records pt refused admission and stated he would go home and restart lasix that his cardiologist had stopped.  Per wife he was advised he could either be admitted or go home. He was not discharged on any medications. On imaging he was found to have a new 9mm RML pulmonary nodule.  Regardless patients symptoms of cough and shortness of breath worsened and he returned to ED on 11/14/23 and had further testing/labs.  WBC was found to be elevated at [address].  He had CTA of chest which showed no pulmonary embolism but showed nonspecific tree-in-bud opacities bilaterally.  He was treated for bronchitis and given Levaquin which he has now completed He is still having productive cough with very thick phlegm with mild malaise.  He is not having a fever.   Pt is supposed to be on oxygen which he does use at home but does not have here in office    11/29/2023    9:38 AM 05/31/2023   10:38 AM 04/03/2023    4:09 PM 03/02/2023    7:45 AM 01/11/2023    3:31 PM  Depression screen PHQ 2/9  Decreased Interest 2 2 0 1 0  Down, Depressed, Hopeless 2 0 0 2 0  PHQ - 2 Score 4 2 0 3 0  Altered sleeping 2 2  3    Tired, decreased energy 2 0  3   Change in appetite 0 2  0   Feeling bad or failure about yourself  0 0  0   Trouble concentrating 1 1  0   Moving slowly or fidgety/restless 0 0  0   Suicidal thoughts 0 0  0   PHQ-9 Score 9 7  9    Difficult doing work/chores Somewhat difficult Very difficult  Somewhat difficult         11/10/2022    5:02 AM 01/11/2023    3:30 PM 04/03/2023    4:08 PM 05/31/2023   10:37 AM 11/29/2023    9:44 AM  Fall Risk  Falls in the past year?  1 1 0 1  Was there an injury with Fall?  0 0 0 0  Fall Risk Category  Calculator  2 2 0 2  (RETIRED) Patient Fall Risk Level Low fall risk      Patient at Risk for Falls Due to  History of fall(s);Impaired balance/gait Impaired mobility No Fall Risks No Fall Risks  Fall risk Follow up  Falls evaluation completed Falls evaluation completed;Falls prevention discussed Falls evaluation completed Falls evaluation completed     ROS CONSTITUTIONAL: see HPI E/N/T: see HPI CARDIOVASCULAR: Negative for chest pain, dizziness, palpitations and pedal edema.  RESPIRATORY: see HPI GASTROINTESTINAL: Negative for abdominal pain, acid reflux symptoms, constipation, diarrhea, nausea and vomiting.  INTEGUMENTARY: Negative for rash.  PSYCHIATRIC: Negative for sleep disturbance and to question depression screen.  Negative for depression, negative for anhedonia.    Current Outpatient Medications:    arformoterol (BROVANA) 15 MCG/2ML NEBU, Take 2 mLs (15 mcg total) by nebulization 2 (two) times daily., Disp: 120 mL, Rfl: 6   azithromycin (ZITHROMAX) 250 MG tablet, Take 250 mg by mouth every other day. Monday Wedneday Friday, Disp: ,  Rfl:    budesonide (PULMICORT) 0.25 MG/2ML nebulizer solution, Take 2 mLs (0.25 mg total) by nebulization in the morning and at bedtime., Disp: 60 mL, Rfl: 11   clopidogrel (PLAVIX) 75 MG tablet, Take 1 tablet by mouth once daily, Disp: 90 tablet, Rfl: 0   cyanocobalamin (VITAMIN B12) 1000 MCG/ML injection, INJECT 1 ML (CC) INTRAMUSCULARLY ONCE A WEEK (Patient taking differently: Inject 100 mcg into the muscle every 30 (thirty) days.), Disp: 10 mL, Rfl: 0   cyclobenzaprine (FLEXERIL) 10 MG tablet, Take 1 tablet (10 mg total) by mouth 3 (three) times daily as needed for muscle spasms., Disp: 90 tablet, Rfl: 1   DULoxetine (CYMBALTA) 60 MG capsule, Take 1 capsule (60 mg total) by mouth 2 (two) times daily., Disp: 60 capsule, Rfl: 3   Dupilumab (DUPIXENT) 300 MG/2ML SOAJ, Inject 300 mg into the skin every 14 (fourteen) days., Disp: 12 mL, Rfl: 1    fenofibrate 160 MG tablet, Take 1 tablet by mouth once daily, Disp: 90 tablet, Rfl: 0   finasteride (PROSCAR) 5 MG tablet, Take 1 tablet by mouth once daily (Patient taking differently: Take 5 mg by mouth daily.), Disp: 90 tablet, Rfl: 0   furosemide (LASIX) 20 MG tablet, Take 20 mg by mouth daily., Disp: , Rfl:    gabapentin (NEURONTIN) 400 MG capsule, TAKE 1 CAPSULE BY MOUTH THREE TIMES DAILY, Disp: 270 capsule, Rfl: 1   insulin lispro (HUMALOG) 100 UNIT/ML injection, Inject 0.04 mLs (4 Units total) into the skin 3 (three) times daily with meals., Disp: 10 mL, Rfl: 11   ipratropium-albuterol (DUONEB) 0.5-2.5 (3) MG/3ML SOLN, USE 1 AMPULE IN NEBULIZER 4 TIMES DAILY (Patient taking differently: Take 3 mLs by nebulization every 6 (six) hours as needed (sob). USE 1 AMPULE IN NEBULIZER 4 TIMES DAILY), Disp: 360 mL, Rfl: 1   Lancets (ONETOUCH DELICA PLUS LANCET33G) MISC, USE 1  TO CHECK GLUCOSE THREE TIMES DAILY (Patient taking differently: 1 each by Other route 3 (three) times daily.), Disp: 100 each, Rfl: 0   levothyroxine (SYNTHROID) 25 MCG tablet, Take 1 tablet by mouth once daily, Disp: 90 tablet, Rfl: 0   losartan (COZAAR) 50 MG tablet, Take 1 tablet (50 mg total) by mouth daily., Disp: 90 tablet, Rfl: 3   MAG64 64 MG TBEC, Take 1 tablet by mouth daily., Disp: , Rfl:    magnesium chloride (SLOW-MAG) 64 MG TBEC SR tablet, Take 1 tablet (64 mg total) by mouth daily., Disp: 90 tablet, Rfl: 3   metFORMIN (GLUCOPHAGE) 1000 MG tablet, TAKE 1 TABLET BY MOUTH TWICE DAILY WITH MEALS, Disp: 180 tablet, Rfl: 0   metoprolol succinate (TOPROL-XL) 100 MG 24 hr tablet, Take 1 tablet (100 mg total) by mouth daily. Take with or immediately following a meal., Disp: 90 tablet, Rfl: 2   MYRBETRIQ 25 MG TB24 tablet, Take 1 tablet by mouth once daily (Patient taking differently: Take 25 mg by mouth daily.), Disp: 30 tablet, Rfl: 0   nitroGLYCERIN (NITROSTAT) 0.4 MG SL tablet, DISSOLVE ONE TABLET UNDER THE TONGUE EVERY 5  TO 10 MINUTES PRIOR TO ACTIVITIES WHICH MIGHT PRECIPITATE AN ATTACK (Patient taking differently: Place 0.4 mg under the tongue every 5 (five) minutes as needed for chest pain.), Disp: 25 tablet, Rfl: 0   nystatin cream (MYCOSTATIN), Apply 1 application topically 2 (two) times daily as needed for dry skin., Disp: , Rfl:    Omega-3 Fatty Acids (FISH OIL) 1000 MG CAPS, Take 2 capsules (2,000 mg total) by mouth in the  morning and at bedtime., Disp: 180 capsule, Rfl: 12   omeprazole (PRILOSEC) 20 MG capsule, Take 1 capsule by mouth once daily, Disp: 90 capsule, Rfl: 0   ONETOUCH ULTRA TEST test strip, USE 1 STRIP TO CHECK GLUCOSE IN THE MORNING AND 1 AT BEDTIME AS DIRECTED (Patient taking differently: 1 each by Other route as needed for other (GLucose check).), Disp: 100 each, Rfl: 0   oxyCODONE-acetaminophen (PERCOCET/ROXICET) 5-325 MG tablet, Take 1 tablet by mouth every 6 (six) hours as needed for severe pain (pain score 7-10)., Disp: 120 tablet, Rfl: 0   predniSONE (DELTASONE) 20 MG tablet, 1 po tid for 3 days then 1 po bid for 3 days then 1 po qd for 3 days, Disp: 18 tablet, Rfl: 0   revefenacin (YUPELRI) 175 MCG/3ML nebulizer solution, Take 3 mLs (175 mcg total) by nebulization daily., Disp: 90 mL, Rfl: 11   rosuvastatin (CRESTOR) 40 MG tablet, Take 1 tablet by mouth once daily (Patient taking differently: Take 40 mg by mouth daily.), Disp: 90 tablet, Rfl: 0   spironolactone (ALDACTONE) 25 MG tablet, Take 1 tablet (25 mg total) by mouth daily., Disp: 90 tablet, Rfl: 3   tamsulosin (FLOMAX) 0.4 MG CAPS capsule, TAKE 2 CAPSULES BY MOUTH ONCE DAILY AFTER SUPPER (Patient taking differently: Take 0.8 mg by mouth daily after supper.), Disp: 180 capsule, Rfl: 0   torsemide (DEMADEX) 20 MG tablet, Take 1 tablet (20 mg total) by mouth as needed (edema)., Disp: 30 tablet, Rfl: 0   TOUJEO SOLOSTAR 300 UNIT/ML Solostar Pen, INJECT 30 UNITS SUBCUTANEOUSLY ONCE DAILY, Disp: 6 mL, Rfl: 0   traZODone (DESYREL) 150 MG  tablet, Take 1 tablet by mouth once daily, Disp: 270 tablet, Rfl: 0   VENTOLIN HFA 108 (90 Base) MCG/ACT inhaler, INHALE 2 PUFFS BY MOUTH EVERY 6 HOURS AS NEEDED FOR SHORTNESS OF BREATH FOR WHEEZING (Patient taking differently: Inhale 2 puffs into the lungs every 6 (six) hours as needed for shortness of breath or wheezing.), Disp: 18 g, Rfl: 0   Vitamin D, Ergocalciferol, (DRISDOL) 1.25 MG (50000 UNIT) CAPS capsule, TAKE 1 CAPSULE BY MOUTH TWICE A WEEK (Patient taking differently: Take 50,000 Units by mouth every 7 (seven) days.), Disp: 12 capsule, Rfl: 3   ALPRAZolam (XANAX) 0.25 MG tablet, Take 1 tablet (0.25 mg total) by mouth 2 (two) times daily as needed for anxiety., Disp: 60 tablet, Rfl: 1   levofloxacin (LEVAQUIN) 750 MG tablet, Take 1 tablet (750 mg total) by mouth daily., Disp: 10 tablet, Rfl: 0  Past Medical History:  Diagnosis Date   Absence of bladder continence 01/08/2022   Acute cough 07/07/2023   Acute infection of nasal sinus 01/13/2023   Anxiety state 02/02/2022   Arthritis    Asymptomatic LV dysfunction 10/18/2017   Atherosclerosis of aorta (HCC) 01/13/2023   Atypical chest pain 11/09/2022   B12 deficiency anemia 08/25/2021   Basal cell carcinoma    Benign prostatic hyperplasia with incomplete bladder emptying 01/14/2019   Bone pain 03/08/2020   Cancer (HCC)    throat - 1997, throat - 2018   Cardiomyopathy, secondary (HCC) 12/01/2016   Overview:  EF 47% 12/26/16   Chest pain syndrome 11/02/2017   Chronic coronary artery disease 10/18/2017   Chronic ischemic heart disease 11/13/1998   Jan 22, 2003 Entered By: Aris Lot J Comment:  massive Mi in 2000 per patient hsitory, 2nd Mi in 2001Mar 11, 2004 Entered By: Aris Lot J Comment: Had stent placedin 2000,cath/angio in 2001   Chronic neck  pain 05/16/2022   Chronic pain syndrome 06/04/2022   Chronic respiratory failure with hypoxia (HCC) 05/16/2022   CKD (chronic kidney disease) stage 3, GFR 30-59 ml/min (HCC)  07/25/2019   COPD (chronic obstructive pulmonary disease) (HCC)    COPD exacerbation (HCC) 07/30/2023   Coronary artery disease involving native coronary artery of native heart with angina pectoris (HCC) 12/01/2016   Overview:  He has hx of IWMI in remote past, last cath in 2012 at [facility] showed chronic total occlusion of previously stented RCA, with good collaterals, a 40% LAD stenosis, inferior hypokinesis and EF 45%.    He's been lost to Cardiology f/u since 2015, but has not had any recurrent events   Deficiency anemia 08/25/2021   Diabetic polyneuropathy associated with type 2 diabetes mellitus (HCC) 05/16/2022   Dyslipidemia 12/01/2016   Encounter for Medicare annual wellness exam 01/13/2023   Erectile dysfunction due to diseases classified elsewhere 06/12/2019   GERD (gastroesophageal reflux disease)    Hallucination 10/08/2022   Hyperlipidemia    Hypertension    Hypertensive heart disease with heart failure (HCC)    Insomnia 01/28/2021   Iron deficiency 07/07/2023   Iron deficiency anemia due to chronic blood loss 08/25/2021   Lesion of vocal cord    Leukoplakia of larynx 11/15/2016   Long-term use of aspirin therapy 10/02/2018   Lumbar back pain 06/04/2022   Lung nodules 08/17/2022   Malfunction of penile prosthesis (HCC) 01/14/2019   Malignant neoplasm of skin 01/28/2021   Formatting of this note might be different from the original. Jan 22, 2003 Entered By: Aris Lot J Comment: of skin - removed x2 inpast Jan 22, 2003 Entered By: Aris Lot J Comment: of skin - removed x2 inpast   Melanoma of back (HCC)    melanoma on back   Memory loss 03/08/2020   Moderate recurrent major depression (HCC) 03/02/2023   Mucopurulent chronic bronchitis (HCC) 04/08/2023   Other ill-defined and unknown causes of morbidity and mortality 11/13/1993   Formatting of this note might be different from the original. Jan 31, 2008 Entered By: ARMOUR,ROSS B Comment: lumbar, 8/08 Jan 22, 2003 Entered  By: Aris Lot J Comment: x15yrs in work place  quit 1yrs ago in 2000 Jan 22, 2003 Entered By: Aris Lot J Comment: x63yrs in work place  quit 16yrs ago in 2000 Jan 31, 2008 Entered By: Bennie Pierini B Comment: lumbar, 8/08   PAD (peripheral artery disease) (HCC) 10/18/2017   Peripheral vascular disease (HCC)    iliac artery clot   Peyronie's disease 06/12/2019   PVC (premature ventricular contraction) 10/09/2023   RBBB 10/09/2023   Squamous cell carcinoma of larynx (HCC) 12/13/2016   Throat cancer (HCC)    Tobacco use disorder 06/12/2019   Type 2 diabetes mellitus with hyperglycemia, with long-term current use of insulin (HCC) 07/30/2023   URI, acute 05/31/2023   Urinary urgency 04/08/2023   Ventricular ectopy 11/09/2022   Vertigo 04/08/2023   Vitamin D deficiency 01/13/2023   Objective:  PHYSICAL EXAM:   BP 110/68 (BP Location: Left Arm, Patient Position: Sitting, Cuff Size: Large)   Pulse 70   Temp 98.8 F (37.1 C) (Temporal)   Ht 5\' 10"  (1.778 m)   Wt 201 lb 6.4 oz (91.4 kg)   SpO2 94%   BMI 28.90 kg/m    GEN: Well nourished, well developed, in no acute distress  HEENT: normal external ears and nose - normal external auditory canals and TMS -m - Lips, Teeth and Gums - normal  Oropharynx -  normal mucosa, palate, and posterior pharynx Cardiac: RRR; no murmurs, rubs, or gallops,no edema - Respiratory:  scattered rhonchi/wheezes noted throughout both lung fields  Skin: warm and dry, no rash  Neuro:  Alert and Oriented x 3, - CN II-Xii grossly intact Psych: euthymic mood, appropriate affect and demeanor  Assessment & Plan:    COPD exacerbation (HCC) -     CBC with Differential/Platelet -     Comprehensive metabolic panel -     Triamcinolone Acetonide 80mg  given IM -     cefTRIAXone Sodium 1 gram given IM -     predniSONE; 1 po tid for 3 days then 1 po bid for 3 days then 1 po qd for 3 days  Dispense: 18 tablet; Refill: 0 Rx Biaxin 500mg  bid for 10 days Chronic  respiratory failure with hypoxia (HCC) -     CBC with Differential/Platelet -     Comprehensive metabolic panel -     Triamcinolone Acetonide -     cefTRIAXone Sodium Use oxygen as directed   Lung nodule CT chest with contrast  Follow-up: Return in about 2 weeks (around 12/13/2023) for with Dr cox 40 min.  An After Visit Summary was printed and given to the patient.  Jettie Pagan [facility] [phone]

## 2023-11-30 NOTE — Addendum Note (Signed)
Addended by: Marianne Sofia on: 11/30/2023 11:53 AM   Modules accepted: Orders

## 2023-12-02 ENCOUNTER — Encounter: Payer: Self-pay | Admitting: Family Medicine

## 2023-12-03 ENCOUNTER — Other Ambulatory Visit: Payer: Self-pay | Admitting: Cardiology

## 2023-12-03 ENCOUNTER — Other Ambulatory Visit: Payer: Self-pay | Admitting: Family Medicine

## 2023-12-03 DIAGNOSIS — J441 Chronic obstructive pulmonary disease with (acute) exacerbation: Secondary | ICD-10-CM

## 2023-12-03 DIAGNOSIS — J418 Mixed simple and mucopurulent chronic bronchitis: Secondary | ICD-10-CM

## 2023-12-04 ENCOUNTER — Other Ambulatory Visit: Payer: Self-pay | Admitting: Family Medicine

## 2023-12-04 ENCOUNTER — Other Ambulatory Visit: Payer: Self-pay | Admitting: Cardiology

## 2023-12-04 ENCOUNTER — Encounter: Payer: Self-pay | Admitting: Internal Medicine

## 2023-12-04 DIAGNOSIS — E782 Mixed hyperlipidemia: Secondary | ICD-10-CM

## 2023-12-04 DIAGNOSIS — K2100 Gastro-esophageal reflux disease with esophagitis, without bleeding: Secondary | ICD-10-CM

## 2023-12-05 ENCOUNTER — Other Ambulatory Visit: Payer: Self-pay

## 2023-12-05 DIAGNOSIS — I739 Peripheral vascular disease, unspecified: Secondary | ICD-10-CM

## 2023-12-06 DIAGNOSIS — R911 Solitary pulmonary nodule: Secondary | ICD-10-CM | POA: Diagnosis not present

## 2023-12-07 ENCOUNTER — Encounter: Payer: Self-pay | Admitting: Physician Assistant

## 2023-12-07 ENCOUNTER — Encounter: Payer: Self-pay | Admitting: Family Medicine

## 2023-12-07 DIAGNOSIS — J432 Centrilobular emphysema: Secondary | ICD-10-CM | POA: Diagnosis not present

## 2023-12-07 DIAGNOSIS — R918 Other nonspecific abnormal finding of lung field: Secondary | ICD-10-CM | POA: Diagnosis not present

## 2023-12-09 ENCOUNTER — Encounter: Payer: Self-pay | Admitting: Internal Medicine

## 2023-12-10 ENCOUNTER — Encounter: Payer: Medicare Other | Admitting: Internal Medicine

## 2023-12-10 NOTE — Progress Notes (Signed)
 This encounter was created in error - please disregard.

## 2023-12-10 NOTE — Patient Instructions (Signed)
ICD-10-CM   1. Stage 3 severe COPD by GOLD classification (HCC)  J44.9     2. COPD with acute exacerbation (HCC)  J44.1     3. COPD, frequent exacerbations (HCC)  J44.1     4. Tobacco use disorder  F17.200     5. Chronic sinusitis, unspecified location  J32.9       Stage 3 severe COPD by GOLD classification (HCC) COPD with acute exacerbation (HCC) COPD, frequent exacerbations (HCC) Tobacco abuse disorder  -Currently coming off another exacerbation.  You continue to have exacerbations despite Dupixent and azithromycin.  Noted that Dupixent helped a little bit but azithromycin has not helped.    -Smoking remains a risk factor for frequent exacerbations  Plan - You need to quit smoking and I think that will help prevent exacerbations -Stop azithromycin because it is not helping you - Continue Roxy Manns, DuoNeb and Dupixent as before -Stop fish oil to see if you might have any silent acid reflux it is negatively impacting her lung - START OTHUVAYRE  -If this does not help you then we will commit to chronic daily prednisone -Take prednisone taper as follows  - Please take Take prednisone 40mg  once daily x 3 days, then 30mg  once daily x 3 days, then 20mg  once daily x 3 days, then prednisone 10mg  once daily  x 3 days and stop -Referral quit smoking cessation clinic at [facility]  Chronic sinusitis, unspecified location Plan - -Refer ENT for chronic sinusitis in case have not seen them before    Follow up   - with Dr. Marchelle Gearing in 8 weeks 15-minute slot; return sooner if needed

## 2023-12-12 ENCOUNTER — Ambulatory Visit: Payer: Medicare Other | Admitting: Family Medicine

## 2023-12-12 VITALS — BP 132/64 | HR 77 | Temp 98.7°F | Ht 70.0 in | Wt 198.0 lb

## 2023-12-12 DIAGNOSIS — J418 Mixed simple and mucopurulent chronic bronchitis: Secondary | ICD-10-CM | POA: Diagnosis not present

## 2023-12-12 DIAGNOSIS — J9611 Chronic respiratory failure with hypoxia: Secondary | ICD-10-CM

## 2023-12-12 DIAGNOSIS — J441 Chronic obstructive pulmonary disease with (acute) exacerbation: Secondary | ICD-10-CM

## 2023-12-12 DIAGNOSIS — Z7952 Long term (current) use of systemic steroids: Secondary | ICD-10-CM | POA: Diagnosis not present

## 2023-12-12 DIAGNOSIS — R053 Chronic cough: Secondary | ICD-10-CM

## 2023-12-12 MED ORDER — ARFORMOTEROL TARTRATE 15 MCG/2ML IN NEBU: 15.0000 ug | INHALATION_SOLUTION | Freq: Two times a day (BID) | RESPIRATORY_TRACT | 6 refills | Status: DC

## 2023-12-12 MED ORDER — BUDESONIDE 0.25 MG/2ML IN SUSP: 0.2500 mg | Freq: Two times a day (BID) | RESPIRATORY_TRACT | 11 refills | Status: DC

## 2023-12-12 MED ORDER — PREDNISONE 10 MG PO TABS: 10.0000 mg | ORAL_TABLET | Freq: Every day | ORAL | 1 refills | Status: DC

## 2023-12-12 MED ORDER — IPRATROPIUM-ALBUTEROL 0.5-2.5 (3) MG/3ML IN SOLN: RESPIRATORY_TRACT | 1 refills | Status: DC

## 2023-12-12 NOTE — Progress Notes (Signed)
Subjective:  Patient ID: Don [name], male    DOB: 01/06/1951  Age: 73 y.o. MRN: [id number]  Chief Complaint  Patient presents with   COPD    HPI   Patient presents for COPD follow up. Wife states he has completed rx for levaquin and prednisone.  His CT scan showed emphysema as well as postinflammatory changes they were unable to compare to his previous CT scan so they have recommended to repeat the CT scan in 3 months. The patient, with a known history of COPD and emphysema, presents with persistent mucus production and cough. Despite various treatments, including Mucinex and nebulizer medications, the patient's symptoms have not improved significantly. The patient's caregiver reports that the patient's symptoms worsen at night, causing sleep disturbances. The patient also experiences difficulty in bringing up the mucus, leading to choking episodes. The patient's caregiver expresses concern about the possibility of other underlying conditions, such as tuberculosis, contributing to the patient's symptoms. The patient also has diabetes and was on steroids, which have been affecting his blood sugar levels. The steroids help when he has an exacerbation.     11/29/2023    9:38 AM 05/31/2023   10:38 AM 04/03/2023    4:09 PM 03/02/2023    7:45 AM 01/11/2023    3:31 PM  Depression screen PHQ 2/9  Decreased Interest 2 2 0 1 0  Down, Depressed, Hopeless 2 0 0 2 0  PHQ - 2 Score 4 2 0 3 0  Altered sleeping 2 2  3    Tired, decreased energy 2 0  3   Change in appetite 0 2  0   Feeling bad or failure about yourself  0 0  0   Trouble concentrating 1 1  0   Moving slowly or fidgety/restless 0 0  0   Suicidal thoughts 0 0  0   PHQ-9 Score 9 7  9    Difficult doing work/chores Somewhat difficult Very difficult  Somewhat difficult         11/29/2023    9:44 AM  Fall Risk   Falls in the past year? 1  Number falls in past yr: 1  Injury with Fall? 0  Risk for fall due to : No Fall Risks   Follow up Falls evaluation completed    Patient Care Team: Blane Ohara, MD as PCP - General (Family Medicine) Baldo Daub, MD as Referring Physician (Cardiology) Suzanna Obey, MD as Consulting Physician (Otolaryngology) Zettie Pho, [facility] (Inactive) as Pharmacist (Pharmacist)   Review of Systems  Constitutional:  Negative for chills, diaphoresis, fatigue and fever.  HENT:  Negative for congestion, ear pain and sore throat.   Respiratory:  Positive for cough. Negative for shortness of breath.   Cardiovascular:  Negative for chest pain and leg swelling.  Gastrointestinal:  Negative for abdominal pain, constipation, diarrhea, nausea and vomiting.  Genitourinary:  Negative for dysuria and urgency.  Musculoskeletal:  Negative for arthralgias and myalgias.  Neurological:  Negative for dizziness and headaches.  Psychiatric/Behavioral:  Negative for dysphoric mood.     Current Outpatient Medications on File Prior to Visit  Medication Sig Dispense Refill   ALPRAZolam (XANAX) 0.25 MG tablet Take 1 tablet (0.25 mg total) by mouth 2 (two) times daily as needed for anxiety. 60 tablet 1   clopidogrel (PLAVIX) 75 MG tablet Take 1 tablet by mouth once daily 90 tablet 0   cyanocobalamin (VITAMIN B12) 1000 MCG/ML injection INJECT 1 ML (CC) INTRAMUSCULARLY ONCE A WEEK (Patient  taking differently: Inject 100 mcg into the muscle every 30 (thirty) days.) 10 mL 0   cyclobenzaprine (FLEXERIL) 10 MG tablet Take 1 tablet (10 mg total) by mouth 3 (three) times daily as needed for muscle spasms. 90 tablet 1   DULoxetine (CYMBALTA) 60 MG capsule Take 1 capsule (60 mg total) by mouth 2 (two) times daily. 60 capsule 3   Dupilumab (DUPIXENT) 300 MG/2ML SOAJ Inject 300 mg into the skin every 14 (fourteen) days. 12 mL 1   fenofibrate 160 MG tablet Take 1 tablet by mouth once daily 90 tablet 0   furosemide (LASIX) 20 MG tablet Take 1 tablet by mouth once daily 90 tablet 0   gabapentin (NEURONTIN) 400 MG  capsule TAKE 1 CAPSULE BY MOUTH THREE TIMES DAILY 270 capsule 1   insulin lispro (HUMALOG) 100 UNIT/ML injection Inject 0.04 mLs (4 Units total) into the skin 3 (three) times daily with meals. 10 mL 11   Lancets (ONETOUCH DELICA PLUS LANCET33G) MISC USE 1  TO CHECK GLUCOSE THREE TIMES DAILY (Patient taking differently: 1 each by Other route 3 (three) times daily.) 100 each 0   levothyroxine (SYNTHROID) 25 MCG tablet Take 1 tablet by mouth once daily 90 tablet 0   losartan (COZAAR) 50 MG tablet Take 1 tablet (50 mg total) by mouth daily. 90 tablet 3   MAG64 64 MG TBEC Take 1 tablet by mouth once daily 90 tablet 0   magnesium chloride (SLOW-MAG) 64 MG TBEC SR tablet Take 1 tablet (64 mg total) by mouth daily. 90 tablet 3   metFORMIN (GLUCOPHAGE) 1000 MG tablet TAKE 1 TABLET BY MOUTH TWICE DAILY WITH MEALS 180 tablet 0   metoprolol succinate (TOPROL-XL) 100 MG 24 hr tablet Take 1 tablet (100 mg total) by mouth daily. Take with or immediately following a meal. 90 tablet 2   MYRBETRIQ 25 MG TB24 tablet Take 1 tablet by mouth once daily (Patient taking differently: Take 25 mg by mouth daily.) 30 tablet 0   nitroGLYCERIN (NITROSTAT) 0.4 MG SL tablet DISSOLVE ONE TABLET UNDER THE TONGUE EVERY 5 TO 10 MINUTES PRIOR TO ACTIVITIES WHICH MIGHT PRECIPITATE AN ATTACK (Patient taking differently: Place 0.4 mg under the tongue every 5 (five) minutes as needed for chest pain.) 25 tablet 0   nystatin cream (MYCOSTATIN) Apply 1 application topically 2 (two) times daily as needed for dry skin.     Omega-3 Fatty Acids (FISH OIL) 1000 MG CAPS Take 2 capsules (2,000 mg total) by mouth in the morning and at bedtime. 180 capsule 12   omeprazole (PRILOSEC) 20 MG capsule Take 1 capsule by mouth once daily 90 capsule 0   ONETOUCH ULTRA TEST test strip USE 1 STRIP TO CHECK GLUCOSE IN THE MORNING AND 1 AT BEDTIME AS DIRECTED (Patient taking differently: 1 each by Other route as needed for other (GLucose check).) 100 each 0    oxyCODONE-acetaminophen (PERCOCET/ROXICET) 5-325 MG tablet Take 1 tablet by mouth every 6 (six) hours as needed for severe pain (pain score 7-10). 120 tablet 0   revefenacin (YUPELRI) 175 MCG/3ML nebulizer solution Take 3 mLs (175 mcg total) by nebulization daily. 90 mL 11   rosuvastatin (CRESTOR) 40 MG tablet Take 1 tablet by mouth once daily 90 tablet 0   spironolactone (ALDACTONE) 25 MG tablet Take 1 tablet (25 mg total) by mouth daily. 90 tablet 3   tamsulosin (FLOMAX) 0.4 MG CAPS capsule TAKE 2 CAPSULES BY MOUTH ONCE DAILY AFTER SUPPER 180 capsule 0   torsemide (  DEMADEX) 20 MG tablet Take 1 tablet (20 mg total) by mouth as needed (edema). 30 tablet 0   traZODone (DESYREL) 150 MG tablet Take 1 tablet by mouth once daily 270 tablet 0   VENTOLIN HFA 108 (90 Base) MCG/ACT inhaler INHALE 2 PUFFS BY MOUTH EVERY 6 HOURS AS NEEDED FOR SHORTNESS OF BREATH FOR WHEEZING (Patient taking differently: Inhale 2 puffs into the lungs every 6 (six) hours as needed for shortness of breath or wheezing.) 18 g 0   Vitamin D, Ergocalciferol, (DRISDOL) 1.25 MG (50000 UNIT) CAPS capsule TAKE 1 CAPSULE BY MOUTH TWICE A WEEK (Patient taking differently: Take 50,000 Units by mouth every 7 (seven) days.) 12 capsule 3   No current facility-administered medications on file prior to visit.   Past Medical History:  Diagnosis Date   Absence of bladder continence 01/08/2022   Acute cough 07/07/2023   Acute infection of nasal sinus 01/13/2023   Anxiety state 02/02/2022   Arthritis    Asymptomatic LV dysfunction 10/18/2017   Atherosclerosis of aorta (HCC) 01/13/2023   Atypical chest pain 11/09/2022   B12 deficiency anemia 08/25/2021   Basal cell carcinoma    Benign prostatic hyperplasia with incomplete bladder emptying 01/14/2019   Bone pain 03/08/2020   Cancer (HCC)    throat - 1997, throat - 2018   Cardiomyopathy, secondary (HCC) 12/01/2016   Overview:  EF 47% 12/26/16   Chest pain syndrome 11/02/2017   Chronic  coronary artery disease 10/18/2017   Chronic ischemic heart disease 11/13/1998   Jan 22, 2003 Entered By: Aris Lot J Comment:  massive Mi in 2000 per patient hsitory, 2nd Mi in 2001Mar 11, 2004 Entered By: Aris Lot J Comment: Had stent placedin 2000,cath/angio in 2001   Chronic neck pain 05/16/2022   Chronic pain syndrome 06/04/2022   Chronic respiratory failure with hypoxia (HCC) 05/16/2022   CKD (chronic kidney disease) stage 3, GFR 30-59 ml/min (HCC) 07/25/2019   COPD (chronic obstructive pulmonary disease) (HCC)    COPD exacerbation (HCC) 07/30/2023   Coronary artery disease involving native coronary artery of native heart with angina pectoris (HCC) 12/01/2016   Overview:  He has hx of IWMI in remote past, last cath in 2012 at [facility] showed chronic total occlusion of previously stented RCA, with good collaterals, a 40% LAD stenosis, inferior hypokinesis and EF 45%.    He's been lost to Cardiology f/u since 2015, but has not had any recurrent events   Deficiency anemia 08/25/2021   Diabetic polyneuropathy associated with type 2 diabetes mellitus (HCC) 05/16/2022   Dyslipidemia 12/01/2016   Encounter for Medicare annual wellness exam 01/13/2023   Erectile dysfunction due to diseases classified elsewhere 06/12/2019   GERD (gastroesophageal reflux disease)    Hallucination 10/08/2022   Hyperlipidemia    Hypertension    Hypertensive heart disease with heart failure (HCC)    Insomnia 01/28/2021   Iron deficiency 07/07/2023   Iron deficiency anemia due to chronic blood loss 08/25/2021   Lesion of vocal cord    Leukoplakia of larynx 11/15/2016   Long-term use of aspirin therapy 10/02/2018   Lumbar back pain 06/04/2022   Lung nodules 08/17/2022   Malfunction of penile prosthesis (HCC) 01/14/2019   Malignant neoplasm of skin 01/28/2021   Formatting of this note might be different from the original. Jan 22, 2003 Entered By: Aris Lot J Comment: of skin - removed x2 inpast Jan 22, 2003 Entered By: Aris Lot J Comment: of skin - removed x2 inpast   Melanoma of back (HCC)  melanoma on back   Memory loss 03/08/2020   Moderate recurrent major depression (HCC) 03/02/2023   Mucopurulent chronic bronchitis (HCC) 04/08/2023   Other ill-defined and unknown causes of morbidity and mortality 11/13/1993   Formatting of this note might be different from the original. Jan 31, 2008 Entered By: ARMOUR,ROSS B Comment: lumbar, 8/08 Jan 22, 2003 Entered By: Aris Lot J Comment: x8yrs in work place  quit 19yrs ago in 2000 Jan 22, 2003 Entered By: Aris Lot J Comment: x17yrs in work place  quit 72yrs ago in 2000 Jan 31, 2008 Entered By: Bennie Pierini B Comment: lumbar, 8/08   PAD (peripheral artery disease) (HCC) 10/18/2017   Peripheral vascular disease (HCC)    iliac artery clot   Peyronie's disease 06/12/2019   PVC (premature ventricular contraction) 10/09/2023   RBBB 10/09/2023   Squamous cell carcinoma of larynx (HCC) 12/13/2016   Throat cancer (HCC)    Tobacco use disorder 06/12/2019   Type 2 diabetes mellitus with hyperglycemia, with long-term current use of insulin (HCC) 07/30/2023   URI, acute 05/31/2023   Urinary urgency 04/08/2023   Ventricular ectopy 11/09/2022   Vertigo 04/08/2023   Vitamin D deficiency 01/13/2023   Past Surgical History:  Procedure Laterality Date   ABDOMINAL ANGIOGRAM N/A 01/13/2015   Procedure: ABDOMINAL ANGIOGRAM;  Surgeon: Larina Earthly, MD;  Location: [facility] CATH LAB;  Service: Cardiovascular;  Laterality: N/A;   APPENDECTOMY     BACK SURGERY     CARDIAC CATHETERIZATION  2000/2012   with stents in 2000   CHOLECYSTECTOMY     COLONOSCOPY     ELBOW SURGERY     bilateral   ESOPHAGOGASTRODUODENOSCOPY     KNEE SURGERY Left    MICROLARYNGOSCOPY WITH CO2 LASER AND EXCISION OF VOCAL CORD LESION N/A 11/23/2016   Procedure: MICROLARYNGOSCOPY  AND EXCISION OF VOCAL CORD LESION;  Surgeon: Suzanna Obey, MD;  Location: [facility] OR;  Service: ENT;  Laterality:  N/A;   MICROLARYNGOSCOPY WITH CO2 LASER AND EXCISION OF VOCAL CORD LESION N/A 01/11/2017   Procedure: MICROLARYNGOSCOPY WITH CO2 LASER AND EXCISION OF VOCAL CORD LESION;  Surgeon: Suzanna Obey, MD;  Location[facility] OR;  Service: ENT;  Laterality: N/A;   MICROLARYNGOSCOPY WITH LASER N/A 03/16/2015   Procedure: MICROLARYNGOSCOPY ;  Surgeon: Suzanna Obey, MD;  Location: [facility] OR;  Service: ENT;  Laterality: N/A;   peyronie's surgery     SHOULDER SURGERY Right    rotator cuff   SPINE SURGERY  09/2020   cervical surgery. steel plate, bone spurs, allograft.   THROAT SURGERY  1997   cancer removed    Family History  Problem Relation Age of Onset   Cancer Mother        bone   Hypertension Mother    Stroke Father    Hypertension Father    Healthy Child    Cancer Other        brain   Diabetes Other    Social History   Socioeconomic History   Marital status: Married    Spouse name: Not on file   Number of children: 4   Years of education: Not on file   Highest education level: GED or equivalent  Occupational History   Occupation: retired  Tobacco Use   Smoking status: Every Day    Current packs/day: 1.00    Average packs/day: 1 pack/day for 50.0 years (50.0 ttl pk-yrs)    Types: Cigarettes   Smokeless tobacco: Never   Tobacco comments:    down to .5  ppd  11/02/2022 hfb  Vaping Use   Vaping status: Former  Substance and Sexual Activity   Alcohol use: Not on file   Drug use: No   Sexual activity: Not on file  Other Topics Concern   Not on file  Social History Narrative   Lives with wife       One level      Right hand   Social Drivers of Health   Financial Resource Strain: Low Risk  (11/12/2023)   Overall Financial Resource Strain (CARDIA)    Difficulty of Paying Living Expenses: Not hard at all  Food Insecurity: No Food Insecurity (11/12/2023)   Hunger Vital Sign    Worried About Running Out of Food in the Last Year: Never true    Ran Out of Food in the Last Year: Never true   Transportation Needs: No Transportation Needs (11/12/2023)   PRAPARE - Administrator, Civil Service (Medical): No    Lack of Transportation (Non-Medical): No  Physical Activity: Inactive (11/12/2023)   Exercise Vital Sign    Days of Exercise per Week: 0 days    Minutes of Exercise per Session: 30 min  Stress: No Stress Concern Present (11/12/2023)   [employer] of Occupational Health - Occupational Stress Questionnaire    Feeling of Stress : Only a little  Social Connections: Socially Isolated (11/12/2023)   Social Connection and Isolation Panel [NHANES]    Frequency of Communication with Friends and Family: Once a week    Frequency of Social Gatherings with Friends and Family: Never    Attends Religious Services: Never    Diplomatic Services operational officer: No    Attends Engineer, structural: Never    Marital Status: Married    Objective:  BP 132/64   Pulse 77   Temp 98.7 F (37.1 C)   Ht 5\' 10"  (1.778 m)   Wt 198 lb (89.8 kg)   SpO2 93%   BMI 28.41 kg/m      12/12/2023    3:22 PM 11/29/2023    9:32 AM 09/18/2023    1:09 PM  BP/Weight  Systolic BP 132 110 129  Diastolic BP 64 68 57  Wt. (Lbs) 198 201.4 195.6  BMI 28.41 kg/m2 28.9 kg/m2 28.07 kg/m2    Physical Exam Vitals reviewed.  Constitutional:      Appearance: Normal appearance.  Cardiovascular:     Rate and Rhythm: Normal rate and regular rhythm.     Heart sounds: Normal heart sounds.  Pulmonary:     Effort: Pulmonary effort is normal.     Breath sounds: Rhonchi present. No wheezing or rales.  Neurological:     Mental Status: He is alert.  Psychiatric:        Mood and Affect: Mood normal.        Behavior: Behavior normal.     Diabetic Foot Exam - Simple   No data filed      Lab Results  Component Value Date   WBC 12.3 (H) 11/29/2023   HGB 12.7 (L) 11/29/2023   HCT 38.5 11/29/2023   PLT 196 11/29/2023   GLUCOSE 192 (H) 11/29/2023   CHOL 119 07/06/2023    TRIG 297 (H) 07/06/2023   HDL 27 (L) 07/06/2023   LDLCALC 46 07/06/2023   ALT 11 11/29/2023   AST 11 11/29/2023   NA 140 11/29/2023   K 4.2 11/29/2023   CL 100 11/29/2023   CREATININE 0.72 (L) 11/29/2023  BUN 9 11/29/2023   CO2 26 11/29/2023   TSH 2.200 05/31/2023   INR 1.05 03/16/2015   HGBA1C 8.1 (H) 07/06/2023   MICROALBUR 30 05/23/2021      Assessment & Plan:    Chronic respiratory failure with hypoxia (HCC) Assessment & Plan: Continue oxygen  Orders: -     Budesonide; Take 2 mLs (0.25 mg total) by nebulization in the morning and at bedtime.  Dispense: 60 mL; Refill: 11 -     predniSONE; Take 1 tablet (10 mg total) by mouth daily with breakfast.  Dispense: 30 tablet; Refill: 1  Mixed simple and mucopurulent chronic bronchitis (HCC) Assessment & Plan: Patient is at a baseline uncontrolled.  Persistent mucus production and coughing, not responding well to current treatments. Discussed the possibility of adding a respiratory vest to help break up mucus. Patient has a history of tuberculosis, currently inactive. I am concerned patient needs regular prednisone.  -Start Prednisone 10 mg daily in the morning. -Order a respiratory vest for patient. -Recommend sputum culture to rule out active infection.  Orders: -     Ipratropium-Albuterol; USE 1 AMPULE IN NEBULIZER 4 TIMES DAILY  Dispense: 360 mL; Refill: 1 -     Arformoterol Tartrate; Take 2 mLs (15 mcg total) by nebulization 2 (two) times daily.  Dispense: 120 mL; Refill: 6 -     predniSONE; Take 1 tablet (10 mg total) by mouth daily with breakfast.  Dispense: 30 tablet; Refill: 1  Long term (current) use of systemic steroids Assessment & Plan: Check dexa.   Chronic cough Assessment & Plan: -Recommend sputum culture to rule out active infection.     Meds ordered this encounter  Medications   ipratropium-albuterol (DUONEB) 0.5-2.5 (3) MG/3ML SOLN    Sig: USE 1 AMPULE IN NEBULIZER 4 TIMES DAILY    Dispense:   360 mL    Refill:  1   arformoterol (BROVANA) 15 MCG/2ML NEBU    Sig: Take 2 mLs (15 mcg total) by nebulization 2 (two) times daily.    Dispense:  120 mL    Refill:  6    Diagnosis code:  J44.1   budesonide (PULMICORT) 0.25 MG/2ML nebulizer solution    Sig: Take 2 mLs (0.25 mg total) by nebulization in the morning and at bedtime.    Dispense:  60 mL    Refill:  11    Diagnosis code:  J44.1   predniSONE (DELTASONE) 10 MG tablet    Sig: Take 1 tablet (10 mg total) by mouth daily with breakfast.    Dispense:  30 tablet    Refill:  1    No orders of the defined types were placed in this encounter.    Follow-up: Return in about 6 weeks (around 01/23/2024) for chronic follow up.   I,Marla I Leal-Borjas,acting as a scribe for Blane Ohara, MD.,have documented all relevant documentation on the behalf of Blane Ohara, MD,as directed by  Blane Ohara, MD while in the presence of Blane Ohara, MD.   An After Visit Summary was printed and given to the patient.  I attest that I have reviewed this visit and agree with the plan scribed by my staff.   Blane Ohara, MD [facility] [phone]

## 2023-12-12 NOTE — Telephone Encounter (Signed)
Patient was seen in the office on 12/10/23 by Dr. Marchelle Gearing, questions/concerns addressed during visit.  Nothing further needed.

## 2023-12-12 NOTE — Patient Instructions (Signed)
Start prednisone 10 mg once daily in AM.  I will work on getting you a respiratory therapist if possible.  Ordering a bone density test to establish presence or absence of osteoporosis.  Follow-up in 6 weeks.  Recommend sputum culture.

## 2023-12-13 ENCOUNTER — Other Ambulatory Visit: Payer: Self-pay | Admitting: Family Medicine

## 2023-12-13 NOTE — Telephone Encounter (Signed)
Patient rescheduled Nothing further needed.

## 2023-12-16 ENCOUNTER — Encounter: Payer: Self-pay | Admitting: Family Medicine

## 2023-12-16 DIAGNOSIS — Z7952 Long term (current) use of systemic steroids: Secondary | ICD-10-CM | POA: Insufficient documentation

## 2023-12-16 DIAGNOSIS — R053 Chronic cough: Secondary | ICD-10-CM | POA: Insufficient documentation

## 2023-12-16 HISTORY — DX: Long term (current) use of systemic steroids: Z79.52

## 2023-12-16 NOTE — Assessment & Plan Note (Signed)
-  Recommend sputum culture to rule out active infection.

## 2023-12-16 NOTE — Assessment & Plan Note (Addendum)
Continue oxygen. 

## 2023-12-16 NOTE — Assessment & Plan Note (Signed)
 Check dexa

## 2023-12-16 NOTE — Assessment & Plan Note (Addendum)
Patient is at a baseline uncontrolled.  Persistent mucus production and coughing, not responding well to current treatments. Discussed the possibility of adding a respiratory vest to help break up mucus. Patient has a history of tuberculosis, currently inactive. I am concerned patient needs regular prednisone.  -Start Prednisone 10 mg daily in the morning. -Order a respiratory vest for patient. -Recommend sputum culture to rule out active infection.

## 2023-12-17 ENCOUNTER — Encounter: Payer: Self-pay | Admitting: Surgery

## 2023-12-17 ENCOUNTER — Ambulatory Visit: Payer: Medicare Other | Admitting: Cardiology

## 2023-12-17 ENCOUNTER — Ambulatory Visit (HOSPITAL_COMMUNITY): Payer: Medicare Other

## 2023-12-17 ENCOUNTER — Encounter: Payer: Medicare Other | Admitting: Surgery

## 2023-12-17 ENCOUNTER — Encounter (HOSPITAL_COMMUNITY): Payer: Self-pay

## 2023-12-18 ENCOUNTER — Encounter: Payer: Self-pay | Admitting: Family Medicine

## 2023-12-18 DIAGNOSIS — J449 Chronic obstructive pulmonary disease, unspecified: Secondary | ICD-10-CM | POA: Diagnosis not present

## 2023-12-18 DIAGNOSIS — J9610 Chronic respiratory failure, unspecified whether with hypoxia or hypercapnia: Secondary | ICD-10-CM | POA: Diagnosis not present

## 2023-12-23 ENCOUNTER — Other Ambulatory Visit: Payer: Self-pay | Admitting: Family Medicine

## 2023-12-23 DIAGNOSIS — I517 Cardiomegaly: Secondary | ICD-10-CM | POA: Diagnosis not present

## 2023-12-23 DIAGNOSIS — I493 Ventricular premature depolarization: Secondary | ICD-10-CM | POA: Diagnosis not present

## 2023-12-23 DIAGNOSIS — R0602 Shortness of breath: Secondary | ICD-10-CM | POA: Diagnosis not present

## 2023-12-23 DIAGNOSIS — R0781 Pleurodynia: Secondary | ICD-10-CM | POA: Diagnosis not present

## 2023-12-23 DIAGNOSIS — R079 Chest pain, unspecified: Secondary | ICD-10-CM | POA: Diagnosis not present

## 2023-12-23 DIAGNOSIS — J449 Chronic obstructive pulmonary disease, unspecified: Secondary | ICD-10-CM | POA: Diagnosis not present

## 2023-12-23 DIAGNOSIS — R1084 Generalized abdominal pain: Secondary | ICD-10-CM

## 2023-12-23 DIAGNOSIS — R058 Other specified cough: Secondary | ICD-10-CM | POA: Diagnosis not present

## 2023-12-23 DIAGNOSIS — Z981 Arthrodesis status: Secondary | ICD-10-CM | POA: Diagnosis not present

## 2023-12-23 DIAGNOSIS — I4510 Unspecified right bundle-branch block: Secondary | ICD-10-CM | POA: Diagnosis not present

## 2023-12-23 DIAGNOSIS — J441 Chronic obstructive pulmonary disease with (acute) exacerbation: Secondary | ICD-10-CM | POA: Diagnosis not present

## 2023-12-23 NOTE — Telephone Encounter (Signed)
 REFER TO GI AT [facility]. DR. Reinhold Carbine

## 2023-12-25 ENCOUNTER — Other Ambulatory Visit: Payer: Self-pay | Admitting: Physician Assistant

## 2023-12-26 MED ORDER — OXYCODONE-ACETAMINOPHEN 5-325 MG PO TABS: 1.0000 | ORAL_TABLET | Freq: Four times a day (QID) | ORAL | 0 refills | Status: DC | PRN

## 2023-12-28 ENCOUNTER — Other Ambulatory Visit: Payer: Self-pay | Admitting: Family Medicine

## 2024-01-05 ENCOUNTER — Other Ambulatory Visit: Payer: Self-pay | Admitting: Cardiology

## 2024-01-05 ENCOUNTER — Other Ambulatory Visit: Payer: Self-pay

## 2024-01-07 ENCOUNTER — Other Ambulatory Visit: Payer: Self-pay | Admitting: Family Medicine

## 2024-01-07 ENCOUNTER — Other Ambulatory Visit: Payer: Self-pay | Admitting: Physician Assistant

## 2024-01-07 ENCOUNTER — Encounter: Payer: Self-pay | Admitting: Family Medicine

## 2024-01-07 ENCOUNTER — Other Ambulatory Visit: Payer: Self-pay | Admitting: Cardiology

## 2024-01-07 ENCOUNTER — Other Ambulatory Visit: Payer: Self-pay | Admitting: Adult Health

## 2024-01-07 DIAGNOSIS — K2100 Gastro-esophageal reflux disease with esophagitis, without bleeding: Secondary | ICD-10-CM

## 2024-01-07 MED ORDER — NITROGLYCERIN 0.4 MG SL SUBL: 0.4000 mg | SUBLINGUAL_TABLET | SUBLINGUAL | 0 refills | Status: DC | PRN

## 2024-01-08 ENCOUNTER — Telehealth (INDEPENDENT_AMBULATORY_CARE_PROVIDER_SITE_OTHER): Payer: Medicare Other | Admitting: Physician Assistant

## 2024-01-08 ENCOUNTER — Encounter: Payer: Self-pay | Admitting: Physician Assistant

## 2024-01-08 VITALS — BP 157/80 | HR 71 | Ht 70.0 in | Wt 198.0 lb

## 2024-01-08 DIAGNOSIS — J441 Chronic obstructive pulmonary disease with (acute) exacerbation: Secondary | ICD-10-CM

## 2024-01-08 MED ORDER — LEVOFLOXACIN 250 MG PO TABS: 250.0000 mg | ORAL_TABLET | Freq: Every day | ORAL | 0 refills | Status: DC

## 2024-01-08 MED ORDER — PREDNISONE 20 MG PO TABS
40.0000 mg | ORAL_TABLET | Freq: Every day | ORAL | 0 refills | Status: DC
Start: 2024-01-08 — End: 2024-02-13

## 2024-01-08 NOTE — Progress Notes (Signed)
 Virtual Visit via Video Note   This visit type was conducted per patient request This format is felt to be appropriate for this patient at this time.  All issues noted in this document were discussed and addressed.  A limited physical exam was performed with this format.  A verbal consent was obtained for the virtual visit.   Date:  01/14/2024   ID:  Don [name], DOB 02/17/1951, MRN [id number]  Patient Location: Home Provider Location: Office/Clinic  PCP:  Blane Ohara, MD   Chief Complaint:  Worsening cough  Discussed the use of AI scribe software for clinical note transcription with the patient, who gave verbal consent to proceed.  Follow up Hospitalization  Patient was seen in [facility] ER on 12/23/23 and discharged on the same date. He was treated for COPD with Acute Exacerbation. Treatment for this included Doxycycline, Duo-neb, Prednisone 40 mg.  He reports excellent compliance with treatment. He reports this condition is stayed the same.    History of Present Illness:    Don [name] is a 73 y.o. male with increased mucus production and cough.  Discussed the use of AI scribe software for clinical note transcription with the patient, who gave verbal consent to proceed.  History of Present Illness   The patient, with a history of COPD, recently visited the ER due to an exacerbation of his condition. He was treated with three doses of Duoneb, oral prednisone 40 mg, and doxycycline 100 mg, which initially improved his condition. However, two nights later, he experienced a significant increase in mucus production and shortness of breath. He denies experiencing chest pain, fluid buildup, fevers, or abdominal pain. His oxygen levels dropped to 81 in the middle of the night but improved to the 90s after a DuoNeb treatment. He appears fatigued and weak during the current video visit, with oxygen levels in the 90s and no fevers.        Chief Complaint  Patient presents with   ER Follow up  .     Past Medical History:  Diagnosis Date   Absence of bladder continence 01/08/2022   Acute cough 07/07/2023   Acute infection of nasal sinus 01/13/2023   Anxiety state 02/02/2022   Arthritis    Asymptomatic LV dysfunction 10/18/2017   Atherosclerosis of aorta (HCC) 01/13/2023   Atypical chest pain 11/09/2022   B12 deficiency anemia 08/25/2021   Basal cell carcinoma    Benign prostatic hyperplasia with incomplete bladder emptying 01/14/2019   Bone pain 03/08/2020   Cancer (HCC)    throat - 1997, throat - 2018   Cardiomyopathy, secondary (HCC) 12/01/2016   Overview:  EF 47% 12/26/16   Chest pain syndrome 11/02/2017   Chronic coronary artery disease 10/18/2017   Chronic ischemic heart disease 11/13/1998   Jan 22, 2003 Entered By: Aris Lot J Comment:  massive Mi in 2000 per patient hsitory, 2nd Mi in 2001Mar 11, 2004 Entered By: Aris Lot J Comment: Had stent placedin 2000,cath/angio in 2001   Chronic neck pain 05/16/2022   Chronic pain syndrome 06/04/2022   Chronic respiratory failure with hypoxia (HCC) 05/16/2022   CKD (chronic kidney disease) stage 3, GFR 30-59 ml/min (HCC) 07/25/2019   COPD (chronic obstructive pulmonary disease) (HCC)    COPD exacerbation (HCC) 07/30/2023   Coronary artery disease involving native coronary artery of native heart with angina pectoris (HCC) 12/01/2016   Overview:  He has hx of IWMI in remote past, last cath in  2012 at [facility] showed chronic total occlusion of previously stented RCA, with good collaterals, a 40% LAD stenosis, inferior hypokinesis and EF 45%.    He's been lost to Cardiology f/u since 2015, but has not had any recurrent events   Deficiency anemia 08/25/2021   Diabetic polyneuropathy associated with type 2 diabetes mellitus (HCC) 05/16/2022   Dyslipidemia 12/01/2016   Encounter for Medicare annual wellness exam 01/13/2023   Erectile dysfunction due to diseases  classified elsewhere 06/12/2019   GERD (gastroesophageal reflux disease)    Hallucination 10/08/2022   Hyperlipidemia    Hypertension    Hypertensive heart disease with heart failure (HCC)    Insomnia 01/28/2021   Iron deficiency 07/07/2023   Iron deficiency anemia due to chronic blood loss 08/25/2021   Lesion of vocal cord    Leukoplakia of larynx 11/15/2016   Long-term use of aspirin therapy 10/02/2018   Lumbar back pain 06/04/2022   Lung nodules 08/17/2022   Malfunction of penile prosthesis (HCC) 01/14/2019   Malignant neoplasm of skin 01/28/2021   Formatting of this note might be different from the original. Jan 22, 2003 Entered By: Aris Lot J Comment: of skin - removed x2 inpast Jan 22, 2003 Entered By: Aris Lot J Comment: of skin - removed x2 inpast   Melanoma of back (HCC)    melanoma on back   Memory loss 03/08/2020   Moderate recurrent major depression (HCC) 03/02/2023   Mucopurulent chronic bronchitis (HCC) 04/08/2023   Other ill-defined and unknown causes of morbidity and mortality 11/13/1993   Formatting of this note might be different from the original. Jan 31, 2008 Entered By: ARMOUR,ROSS B Comment: lumbar, 8/08 Jan 22, 2003 Entered By: Aris Lot J Comment: x27yrs in work place  quit 57yrs ago in 2000 Jan 22, 2003 Entered By: Aris Lot J Comment: x66yrs in work place  quit 22yrs ago in 2000 Jan 31, 2008 Entered By: Bennie Pierini B Comment: lumbar, 8/08   PAD (peripheral artery disease) (HCC) 10/18/2017   Peripheral vascular disease (HCC)    iliac artery clot   Peyronie's disease 06/12/2019   PVC (premature ventricular contraction) 10/09/2023   RBBB 10/09/2023   Squamous cell carcinoma of larynx (HCC) 12/13/2016   Throat cancer (HCC)    Tobacco use disorder 06/12/2019   Type 2 diabetes mellitus with hyperglycemia, with long-term current use of insulin (HCC) 07/30/2023   URI, acute 05/31/2023   Urinary urgency 04/08/2023   Ventricular ectopy 11/09/2022    Vertigo 04/08/2023   Vitamin D deficiency 01/13/2023    Past Surgical History:  Procedure Laterality Date   ABDOMINAL ANGIOGRAM N/A 01/13/2015   Procedure: ABDOMINAL ANGIOGRAM;  Surgeon: Larina Earthly, MD;  Location: [facility] CATH LAB;  Service: Cardiovascular;  Laterality: N/A;   APPENDECTOMY     BACK SURGERY     CARDIAC CATHETERIZATION  2000/2012   with stents in 2000   CHOLECYSTECTOMY     COLONOSCOPY     ELBOW SURGERY     bilateral   ESOPHAGOGASTRODUODENOSCOPY     KNEE SURGERY Left    MICROLARYNGOSCOPY WITH CO2 LASER AND EXCISION OF VOCAL CORD LESION N/A 11/23/2016   Procedure: MICROLARYNGOSCOPY  AND EXCISION OF VOCAL CORD LESION;  Surgeon: Suzanna Obey, MD;  Location: [facility] OR;  Service: ENT;  Laterality: N/A;   MICROLARYNGOSCOPY WITH CO2 LASER AND EXCISION OF VOCAL CORD LESION N/A 01/11/2017   Procedure: MICROLARYNGOSCOPY WITH CO2 LASER AND EXCISION OF VOCAL CORD LESION;  Surgeon: Suzanna Obey, MD;  Location: [facility] OR;  Service: ENT;  Laterality: N/A;   MICROLARYNGOSCOPY WITH LASER N/A 03/16/2015   Procedure: MICROLARYNGOSCOPY ;  Surgeon: Suzanna Obey, MD;  Location: [facility] OR;  Service: ENT;  Laterality: N/A;   peyronie's surgery     SHOULDER SURGERY Right    rotator cuff   SPINE SURGERY  09/2020   cervical surgery. steel plate, bone spurs, allograft.   THROAT SURGERY  1997   cancer removed    Family History  Problem Relation Age of Onset   Cancer Mother        bone   Hypertension Mother    Stroke Father    Hypertension Father    Healthy Child    Cancer Other        brain   Diabetes Other     Social History   Socioeconomic History   Marital status: Married    Spouse name: Not on file   Number of children: 4   Years of education: Not on file   Highest education level: GED or equivalent  Occupational History   Occupation: retired  Tobacco Use   Smoking status: Every Day    Current packs/day: 1.00    Average packs/day: 1 pack/day for 50.0 years (50.0 ttl pk-yrs)    Types: Cigarettes    Smokeless tobacco: Never   Tobacco comments:    down to .5 ppd  11/02/2022 hfb  Vaping Use   Vaping status: Former  Substance and Sexual Activity   Alcohol use: Not on file   Drug use: No   Sexual activity: Not on file  Other Topics Concern   Not on file  Social History Narrative   Lives with wife       One level      Right hand   Social Drivers of Health   Financial Resource Strain: Low Risk  (11/12/2023)   Overall Financial Resource Strain (CARDIA)    Difficulty of Paying Living Expenses: Not hard at all  Food Insecurity: No Food Insecurity (11/12/2023)   Hunger Vital Sign    Worried About Running Out of Food in the Last Year: Never true    Ran Out of Food in the Last Year: Never true  Transportation Needs: No Transportation Needs (11/12/2023)   PRAPARE - Administrator, Civil Service (Medical): No    Lack of Transportation (Non-Medical): No  Physical Activity: Unknown (11/12/2023)   Exercise Vital Sign    Days of Exercise per Week: 0 days    Minutes of Exercise per Session: Not on file  Recent Concern: Physical Activity - Inactive (11/12/2023)   Exercise Vital Sign    Days of Exercise per Week: 0 days    Minutes of Exercise per Session: 30 min  Stress: No Stress Concern Present (11/12/2023)   [employer] of Occupational Health - Occupational Stress Questionnaire    Feeling of Stress : Only a little  Social Connections: Socially Isolated (11/12/2023)   Social Connection and Isolation Panel [NHANES]    Frequency of Communication with Friends and Family: Once a week    Frequency of Social Gatherings with Friends and Family: Never    Attends Religious Services: Never    Database administrator or Organizations: No    Attends Engineer, structural: Not on file    Marital Status: Married  Catering manager Violence: Not At Risk (01/11/2023)   Humiliation, Afraid, Rape, and Kick questionnaire    Fear of Current or Ex-Partner: No    Emotionally  Abused: No  Physically Abused: No    Sexually Abused: No    Outpatient Medications Prior to Visit  Medication Sig Dispense Refill   ALPRAZolam (XANAX) 0.25 MG tablet Take 1 tablet (0.25 mg total) by mouth 2 (two) times daily as needed for anxiety. 60 tablet 1   arformoterol (BROVANA) 15 MCG/2ML NEBU Take 2 mLs (15 mcg total) by nebulization 2 (two) times daily. 120 mL 6   budesonide (PULMICORT) 0.25 MG/2ML nebulizer solution Take 2 mLs (0.25 mg total) by nebulization in the morning and at bedtime. 60 mL 11   clopidogrel (PLAVIX) 75 MG tablet Take 1 tablet by mouth once daily 90 tablet 0   cyanocobalamin (VITAMIN B12) 1000 MCG/ML injection INJECT 1 ML (CC) INTRAMUSCULARLY ONCE A WEEK (Patient taking differently: Inject 100 mcg into the muscle every 30 (thirty) days.) 10 mL 0   cyclobenzaprine (FLEXERIL) 10 MG tablet Take 1 tablet by mouth three times daily as needed for muscle spasm 90 tablet 0   DULoxetine (CYMBALTA) 60 MG capsule Take 1 capsule (60 mg total) by mouth 2 (two) times daily. 60 capsule 3   Dupilumab (DUPIXENT) 300 MG/2ML SOAJ Inject 300 mg into the skin every 14 (fourteen) days. 12 mL 1   fenofibrate 160 MG tablet Take 1 tablet by mouth once daily 90 tablet 0   finasteride (PROSCAR) 5 MG tablet Take 1 tablet by mouth once daily 90 tablet 0   furosemide (LASIX) 20 MG tablet Take 1 tablet by mouth once daily 90 tablet 0   gabapentin (NEURONTIN) 400 MG capsule TAKE 1 CAPSULE BY MOUTH THREE TIMES DAILY 270 capsule 1   insulin lispro (HUMALOG) 100 UNIT/ML injection Inject 0.04 mLs (4 Units total) into the skin 3 (three) times daily with meals. 10 mL 11   ipratropium-albuterol (DUONEB) 0.5-2.5 (3) MG/3ML SOLN USE 1 AMPULE IN NEBULIZER 4 TIMES DAILY 360 mL 1   Lancets (ONETOUCH DELICA PLUS LANCET33G) MISC USE 1  TO CHECK GLUCOSE THREE TIMES DAILY (Patient taking differently: 1 each by Other route 3 (three) times daily.) 100 each 0   levothyroxine (SYNTHROID) 25 MCG tablet Take 1 tablet  by mouth once daily 90 tablet 0   losartan (COZAAR) 50 MG tablet Take 1 tablet (50 mg total) by mouth daily. 90 tablet 2   MAG64 64 MG TBEC Take 1 tablet by mouth once daily 90 tablet 0   magnesium chloride (SLOW-MAG) 64 MG TBEC SR tablet Take 1 tablet (64 mg total) by mouth daily. 90 tablet 3   metFORMIN (GLUCOPHAGE) 1000 MG tablet TAKE 1 TABLET BY MOUTH TWICE DAILY WITH MEALS 180 tablet 0   metoprolol succinate (TOPROL-XL) 100 MG 24 hr tablet Take 1 tablet (100 mg total) by mouth daily. Take with or immediately following a meal. 90 tablet 2   MYRBETRIQ 25 MG TB24 tablet Take 1 tablet by mouth once daily (Patient taking differently: Take 25 mg by mouth daily.) 30 tablet 0   nitroGLYCERIN (NITROSTAT) 0.4 MG SL tablet Place 1 tablet (0.4 mg total) under the tongue every 5 (five) minutes as needed for chest pain. DISSOLVE ONE TABLET UNDER THE TONGUE EVERY 5 TO 10 MINUTES PRIOR TO ACTIVITIES WHICH MIGHT PRECIPITATE AN ATTACK 25 tablet 0   nystatin cream (MYCOSTATIN) Apply 1 application topically 2 (two) times daily as needed for dry skin.     Omega-3 Fatty Acids (FISH OIL) 1000 MG CAPS Take 2 capsules (2,000 mg total) by mouth in the morning and at bedtime. 180 capsule 12  omeprazole (PRILOSEC) 20 MG capsule Take 1 capsule by mouth once daily 90 capsule 0   oxyCODONE-acetaminophen (PERCOCET/ROXICET) 5-325 MG tablet Take 1 tablet by mouth every 6 (six) hours as needed for severe pain (pain score 7-10). 120 tablet 0   predniSONE (DELTASONE) 10 MG tablet Take 1 tablet (10 mg total) by mouth daily with breakfast. 30 tablet 1   revefenacin (YUPELRI) 175 MCG/3ML nebulizer solution Take 3 mLs (175 mcg total) by nebulization daily. 90 mL 11   rosuvastatin (CRESTOR) 40 MG tablet Take 1 tablet by mouth once daily 90 tablet 0   spironolactone (ALDACTONE) 25 MG tablet Take 1 tablet (25 mg total) by mouth daily. 90 tablet 3   tamsulosin (FLOMAX) 0.4 MG CAPS capsule TAKE 2 CAPSULES BY MOUTH ONCE DAILY AFTER SUPPER  180 capsule 0   torsemide (DEMADEX) 20 MG tablet Take 1 tablet (20 mg total) by mouth as needed (edema). 30 tablet 0   TOUJEO SOLOSTAR 300 UNIT/ML Solostar Pen INJECT 30 UNITS SUBCUTANEOUSLY ONCE DAILY 6 mL 0   traZODone (DESYREL) 150 MG tablet Take 1 tablet by mouth once daily 270 tablet 0   VENTOLIN HFA 108 (90 Base) MCG/ACT inhaler INHALE 2 PUFFS BY MOUTH EVERY 6 HOURS AS NEEDED FOR SHORTNESS OF BREATH FOR WHEEZING (Patient taking differently: Inhale 2 puffs into the lungs every 6 (six) hours as needed for shortness of breath or wheezing.) 18 g 0   Vitamin D, Ergocalciferol, (DRISDOL) 1.25 MG (50000 UNIT) CAPS capsule TAKE 1 CAPSULE BY MOUTH TWICE A WEEK (Patient taking differently: Take 50,000 Units by mouth every 7 (seven) days.) 12 capsule 3   ONETOUCH ULTRA TEST test strip USE 1 STRIP TO CHECK GLUCOSE IN THE MORNING AND 1 AT BEDTIME AS DIRECTED (Patient taking differently: 1 each by Other route as needed for other (GLucose check).) 100 each 0   No facility-administered medications prior to visit.    Allergies  Allergen Reactions   Penicillins Rash and Hives    Has patient had a PCN reaction causing immediate rash, facial/tongue/throat swelling, SOB or lightheadedness with hypotension:unsure Has patient had a PCN reaction causing severe rash involving mucus membranes or skin necrosis:unsure Has patient had a PCN reaction that required hospitalization:No Has patient had a PCN reaction occurring within the last 10 years:No If all of the above answers are "NO", then may proceed with Cephalosporin use.  rash   Bactrim [Sulfamethoxazole-Trimethoprim]     Dizziness, confusion, involuntary movements   Doxycycline Rash   Iodinated Contrast Media Rash    Also developed blisters   Penicillin G Rash   Tramadol Rash     Social History   Tobacco Use   Smoking status: Every Day    Current packs/day: 1.00    Average packs/day: 1 pack/day for 50.0 years (50.0 ttl pk-yrs)    Types:  Cigarettes   Smokeless tobacco: Never   Tobacco comments:    down to .5 ppd  11/02/2022 hfb  Vaping Use   Vaping status: Former  Substance Use Topics   Drug use: No     Review of Systems  Constitutional:  Positive for malaise/fatigue. Negative for fever.  HENT:  Positive for congestion. Negative for ear pain and sore throat.   Respiratory:  Positive for cough and shortness of breath.   Cardiovascular:  Negative for chest pain.  Gastrointestinal:  Negative for abdominal pain.  Musculoskeletal:  Negative for joint pain and myalgias.  Neurological:  Positive for weakness. Negative for dizziness and headaches.  Labs/Other Tests and Data Reviewed:    Recent Labs: 05/31/2023: Magnesium 1.6; TSH 2.200 07/06/2023: NT-Pro BNP 665 11/29/2023: ALT 11; BUN 9; Creatinine, Ser 0.72; Hemoglobin 12.7; Platelets 196; Potassium 4.2; Sodium 140   Recent Lipid Panel Lab Results  Component Value Date/Time   CHOL 119 07/06/2023 11:47 AM   TRIG 297 (H) 07/06/2023 11:47 AM   HDL 27 (L) 07/06/2023 11:47 AM   CHOLHDL 4.4 07/06/2023 11:47 AM   LDLCALC 46 07/06/2023 11:47 AM    Wt Readings from Last 3 Encounters:  01/08/24 198 lb (89.8 kg)  12/12/23 198 lb (89.8 kg)  11/29/23 201 lb 6.4 oz (91.4 kg)     Objective:    Vital Signs:  BP (!) 157/80   Pulse 71   Ht 5\' 10"  (1.778 m)   Wt 198 lb (89.8 kg)   SpO2 91%   BMI 28.41 kg/m    Physical Exam Unable to perform due to video visit. Patient appears weak and fatigued. Was not able to provide a lot of feedback due to fatigue and his wife spoke for him most of the visit.   ASSESSMENT & PLAN:   COPD exacerbation (HCC) Assessment & Plan: Recent ER visit on 12/23/2023 for COPD exacerbation, improved with Duoneb and oral prednisone 40mg . However, worsening symptoms noted two nights ago with increased mucus production, shortness of breath, and nocturnal hypoxia. No fever, chest pain, or fluid buildup reported. Currently fatigued and weak, but  oxygen saturation in the 90s. -Review ER records to determine duration of prednisone and doxycycline prescribed. -If prednisone course was not extended, restart prednisone 40mg  daily for two weeks. -Consider changing antibiotic to a fluoroquinolone, given recent use of doxycycline.   Orders: -     predniSONE; Take 2 tablets (40 mg total) by mouth daily with breakfast.  Dispense: 14 tablet; Refill: 0 -     levoFLOXacin; Take 1 tablet (250 mg total) by mouth daily.  Dispense: 7 tablet; Refill: 0     No orders of the defined types were placed in this encounter.    Meds ordered this encounter  Medications   predniSONE (DELTASONE) 20 MG tablet    Sig: Take 2 tablets (40 mg total) by mouth daily with breakfast.    Dispense:  14 tablet    Refill:  0   levofloxacin (LEVAQUIN) 250 MG tablet    Sig: Take 1 tablet (250 mg total) by mouth daily.    Dispense:  7 tablet    Refill:  0  Assessment and Plan     I spent 20 minutes dedicated to the care of this patient on the date of this encounter to include time reviewing the chart and time with the patient.  Follow Up:  In Person prn  Signed,   [facility] Adult nurse

## 2024-01-09 ENCOUNTER — Other Ambulatory Visit: Payer: Self-pay | Admitting: Family Medicine

## 2024-01-14 NOTE — Assessment & Plan Note (Signed)
 Recent ER visit on 12/23/2023 for COPD exacerbation, improved with Duoneb and oral prednisone 40mg . However, worsening symptoms noted two nights ago with increased mucus production, shortness of breath, and nocturnal hypoxia. No fever, chest pain, or fluid buildup reported. Currently fatigued and weak, but oxygen saturation in the 90s. -Review ER records to determine duration of prednisone and doxycycline prescribed. -If prednisone course was not extended, restart prednisone 40mg  daily for two weeks. -Consider changing antibiotic to a fluoroquinolone, given recent use of doxycycline.

## 2024-01-15 ENCOUNTER — Ambulatory Visit: Payer: Medicare Other | Admitting: Pulmonary Disease

## 2024-01-15 DIAGNOSIS — J9610 Chronic respiratory failure, unspecified whether with hypoxia or hypercapnia: Secondary | ICD-10-CM | POA: Diagnosis not present

## 2024-01-15 DIAGNOSIS — J449 Chronic obstructive pulmonary disease, unspecified: Secondary | ICD-10-CM | POA: Diagnosis not present

## 2024-01-17 ENCOUNTER — Other Ambulatory Visit: Payer: Self-pay | Admitting: Family Medicine

## 2024-01-20 ENCOUNTER — Encounter: Payer: Self-pay | Admitting: Family Medicine

## 2024-01-20 ENCOUNTER — Other Ambulatory Visit: Payer: Self-pay | Admitting: Family Medicine

## 2024-01-20 ENCOUNTER — Encounter: Payer: Self-pay | Admitting: Surgery

## 2024-01-21 ENCOUNTER — Other Ambulatory Visit: Payer: Self-pay

## 2024-01-21 ENCOUNTER — Ambulatory Visit (HOSPITAL_COMMUNITY): Payer: Medicare Other | Attending: Surgery

## 2024-01-21 ENCOUNTER — Encounter (HOSPITAL_COMMUNITY): Payer: Self-pay

## 2024-01-21 ENCOUNTER — Encounter: Payer: Medicare Other | Admitting: Surgery

## 2024-01-21 DIAGNOSIS — E1165 Type 2 diabetes mellitus with hyperglycemia: Secondary | ICD-10-CM

## 2024-01-21 MED ORDER — DULOXETINE HCL 60 MG PO CPEP: 60.0000 mg | ORAL_CAPSULE | Freq: Two times a day (BID) | ORAL | 0 refills | Status: DC

## 2024-01-21 MED ORDER — INSULIN SYRINGE-NEEDLE U-100 31G X 5/16" 0.3 ML MISC
0 refills | Status: DC
Start: 2024-01-21 — End: 2024-04-15

## 2024-01-21 MED ORDER — OXYCODONE-ACETAMINOPHEN 5-325 MG PO TABS: 1.0000 | ORAL_TABLET | Freq: Four times a day (QID) | ORAL | 0 refills | Status: DC | PRN

## 2024-01-21 MED ORDER — PEN NEEDLES 31G X 8 MM MISC: 1 refills | Status: DC

## 2024-01-22 NOTE — Progress Notes (Deleted)
 Subjective:  Patient ID: Don [name], male    DOB: 02/13/1951  Age: 73 y.o. MRN: [id number]  No chief complaint on file.   Discussed the use of AI scribe software for clinical note transcription with the patient, who gave verbal consent to proceed.  History of Present Illness               11/29/2023    9:38 AM 05/31/2023   10:38 AM 04/03/2023    4:09 PM 03/02/2023    7:45 AM 01/11/2023    3:31 PM  Depression screen PHQ 2/9  Decreased Interest 2 2 0 1 0  Down, Depressed, Hopeless 2 0 0 2 0  PHQ - 2 Score 4 2 0 3 0  Altered sleeping 2 2  3    Tired, decreased energy 2 0  3   Change in appetite 0 2  0   Feeling bad or failure about yourself  0 0  0   Trouble concentrating 1 1  0   Moving slowly or fidgety/restless 0 0  0   Suicidal thoughts 0 0  0   PHQ-9 Score 9 7  9    Difficult doing work/chores Somewhat difficult Very difficult  Somewhat difficult         11/29/2023    9:44 AM  Fall Risk   Falls in the past year? 1  Number falls in past yr: 1  Injury with Fall? 0  Risk for fall due to : No Fall Risks  Follow up Falls evaluation completed    Patient Care Team: Blane Ohara, MD as PCP - General (Family Medicine) Baldo Daub, MD as Referring Physician (Cardiology) Suzanna Obey, MD as Consulting Physician (Otolaryngology) Zettie Pho, [facility] (Inactive) as Pharmacist (Pharmacist)   Review of Systems  Current Outpatient Medications on File Prior to Visit  Medication Sig Dispense Refill   ALPRAZolam (XANAX) 0.25 MG tablet Take 1 tablet (0.25 mg total) by mouth 2 (two) times daily as needed for anxiety. 60 tablet 1   arformoterol (BROVANA) 15 MCG/2ML NEBU Take 2 mLs (15 mcg total) by nebulization 2 (two) times daily. 120 mL 6   budesonide (PULMICORT) 0.25 MG/2ML nebulizer solution Take 2 mLs (0.25 mg total) by nebulization in the morning and at bedtime. 60 mL 11   clopidogrel (PLAVIX) 75 MG tablet Take 1 tablet by mouth once daily 90 tablet 0    cyanocobalamin (VITAMIN B12) 1000 MCG/ML injection INJECT 1 ML (CC) INTRAMUSCULARLY ONCE A WEEK (Patient taking differently: Inject 100 mcg into the muscle every 30 (thirty) days.) 10 mL 0   cyclobenzaprine (FLEXERIL) 10 MG tablet Take 1 tablet by mouth three times daily as needed for muscle spasm 90 tablet 0   DULoxetine (CYMBALTA) 60 MG capsule Take 1 capsule (60 mg total) by mouth 2 (two) times daily. 180 capsule 0   Dupilumab (DUPIXENT) 300 MG/2ML SOAJ Inject 300 mg into the skin every 14 (fourteen) days. 12 mL 1   fenofibrate 160 MG tablet Take 1 tablet by mouth once daily 90 tablet 0   finasteride (PROSCAR) 5 MG tablet Take 1 tablet by mouth once daily 90 tablet 0   furosemide (LASIX) 20 MG tablet Take 1 tablet by mouth once daily 90 tablet 0   gabapentin (NEURONTIN) 400 MG capsule TAKE 1 CAPSULE BY MOUTH THREE TIMES DAILY 270 capsule 1   insulin lispro (HUMALOG) 100 UNIT/ML injection Inject 0.04 mLs (4 Units total) into the skin 3 (three) times daily with  meals. 10 mL 11   Insulin Pen Needle (PEN NEEDLES) 31G X 8 MM MISC Inject insulin as directed 100 each 1   Insulin Syringe-Needle U-100 31G X 5/16" 0.3 ML MISC Inject insulin as directed 100 each 0   ipratropium-albuterol (DUONEB) 0.5-2.5 (3) MG/3ML SOLN USE 1 AMPULE IN NEBULIZER 4 TIMES DAILY 360 mL 1   Lancets (ONETOUCH DELICA PLUS LANCET33G) MISC USE 1  TO CHECK GLUCOSE THREE TIMES DAILY (Patient taking differently: 1 each by Other route 3 (three) times daily.) 100 each 0   levofloxacin (LEVAQUIN) 250 MG tablet Take 1 tablet (250 mg total) by mouth daily. 7 tablet 0   levothyroxine (SYNTHROID) 25 MCG tablet Take 1 tablet by mouth once daily 90 tablet 0   losartan (COZAAR) 50 MG tablet Take 1 tablet (50 mg total) by mouth daily. 90 tablet 2   MAG64 64 MG TBEC Take 1 tablet by mouth once daily 90 tablet 0   magnesium chloride (SLOW-MAG) 64 MG TBEC SR tablet Take 1 tablet (64 mg total) by mouth daily. 90 tablet 3   metFORMIN (GLUCOPHAGE)  1000 MG tablet TAKE 1 TABLET BY MOUTH TWICE DAILY WITH MEALS 180 tablet 0   metoprolol succinate (TOPROL-XL) 100 MG 24 hr tablet Take 1 tablet (100 mg total) by mouth daily. Take with or immediately following a meal. 90 tablet 2   MYRBETRIQ 25 MG TB24 tablet Take 1 tablet by mouth once daily (Patient taking differently: Take 25 mg by mouth daily.) 30 tablet 0   nitroGLYCERIN (NITROSTAT) 0.4 MG SL tablet Place 1 tablet (0.4 mg total) under the tongue every 5 (five) minutes as needed for chest pain. DISSOLVE ONE TABLET UNDER THE TONGUE EVERY 5 TO 10 MINUTES PRIOR TO ACTIVITIES WHICH MIGHT PRECIPITATE AN ATTACK 25 tablet 0   nystatin cream (MYCOSTATIN) Apply 1 application topically 2 (two) times daily as needed for dry skin.     Omega-3 Fatty Acids (FISH OIL) 1000 MG CAPS Take 2 capsules (2,000 mg total) by mouth in the morning and at bedtime. 180 capsule 12   omeprazole (PRILOSEC) 20 MG capsule Take 1 capsule by mouth once daily 90 capsule 0   ONETOUCH ULTRA test strip USE 1 STRIP TO CHECK GLUCOSE IN THE MORNING AND 1 AT BEDTIME 100 each 0   oxyCODONE-acetaminophen (PERCOCET/ROXICET) 5-325 MG tablet Take 1 tablet by mouth every 6 (six) hours as needed for severe pain (pain score 7-10). 120 tablet 0   predniSONE (DELTASONE) 10 MG tablet Take 1 tablet (10 mg total) by mouth daily with breakfast. 30 tablet 1   predniSONE (DELTASONE) 20 MG tablet Take 2 tablets (40 mg total) by mouth daily with breakfast. 14 tablet 0   revefenacin (YUPELRI) 175 MCG/3ML nebulizer solution Take 3 mLs (175 mcg total) by nebulization daily. 90 mL 11   rosuvastatin (CRESTOR) 40 MG tablet Take 1 tablet by mouth once daily 90 tablet 0   spironolactone (ALDACTONE) 25 MG tablet Take 1 tablet (25 mg total) by mouth daily. 90 tablet 3   tamsulosin (FLOMAX) 0.4 MG CAPS capsule TAKE 2 CAPSULES BY MOUTH ONCE DAILY AFTER SUPPER 180 capsule 0   torsemide (DEMADEX) 20 MG tablet Take 1 tablet (20 mg total) by mouth as needed (edema). 30  tablet 0   TOUJEO SOLOSTAR 300 UNIT/ML Solostar Pen INJECT 30 UNITS SUBCUTANEOUSLY ONCE DAILY 6 mL 0   traZODone (DESYREL) 150 MG tablet Take 1 tablet by mouth once daily 270 tablet 0   VENTOLIN HFA 108 (  90 Base) MCG/ACT inhaler INHALE 2 PUFFS BY MOUTH EVERY 6 HOURS AS NEEDED FOR SHORTNESS OF BREATH FOR WHEEZING (Patient taking differently: Inhale 2 puffs into the lungs every 6 (six) hours as needed for shortness of breath or wheezing.) 18 g 0   Vitamin D, Ergocalciferol, (DRISDOL) 1.25 MG (50000 UNIT) CAPS capsule TAKE 1 CAPSULE BY MOUTH TWICE A WEEK (Patient taking differently: Take 50,000 Units by mouth every 7 (seven) days.) 12 capsule 3   No current facility-administered medications on file prior to visit.   Past Medical History:  Diagnosis Date   Absence of bladder continence 01/08/2022   Acute cough 07/07/2023   Acute infection of nasal sinus 01/13/2023   Anxiety state 02/02/2022   Arthritis    Asymptomatic LV dysfunction 10/18/2017   Atherosclerosis of aorta (HCC) 01/13/2023   Atypical chest pain 11/09/2022   B12 deficiency anemia 08/25/2021   Basal cell carcinoma    Benign prostatic hyperplasia with incomplete bladder emptying 01/14/2019   Bone pain 03/08/2020   Cancer (HCC)    throat - 1997, throat - 2018   Cardiomyopathy, secondary (HCC) 12/01/2016   Overview:  EF 47% 12/26/16   Chest pain syndrome 11/02/2017   Chronic coronary artery disease 10/18/2017   Chronic ischemic heart disease 11/13/1998   Jan 22, 2003 Entered By: Aris Lot J Comment:  massive Mi in 2000 per patient hsitory, 2nd Mi in 2001Mar 11, 2004 Entered By: Aris Lot J Comment: Had stent placedin 2000,cath/angio in 2001   Chronic neck pain 05/16/2022   Chronic pain syndrome 06/04/2022   Chronic respiratory failure with hypoxia (HCC) 05/16/2022   CKD (chronic kidney disease) stage 3, GFR 30-59 ml/min (HCC) 07/25/2019   COPD (chronic obstructive pulmonary disease) (HCC)    COPD exacerbation (HCC)  07/30/2023   Coronary artery disease involving native coronary artery of native heart with angina pectoris (HCC) 12/01/2016   Overview:  He has hx of IWMI in remote past, last cath in 2012 at [facility] showed chronic total occlusion of previously stented RCA, with good collaterals, a 40% LAD stenosis, inferior hypokinesis and EF 45%.    He's been lost to Cardiology f/u since 2015, but has not had any recurrent events   Deficiency anemia 08/25/2021   Diabetic polyneuropathy associated with type 2 diabetes mellitus (HCC) 05/16/2022   Dyslipidemia 12/01/2016   Encounter for Medicare annual wellness exam 01/13/2023   Erectile dysfunction due to diseases classified elsewhere 06/12/2019   GERD (gastroesophageal reflux disease)    Hallucination 10/08/2022   Hyperlipidemia    Hypertension    Hypertensive heart disease with heart failure (HCC)    Insomnia 01/28/2021   Iron deficiency 07/07/2023   Iron deficiency anemia due to chronic blood loss 08/25/2021   Lesion of vocal cord    Leukoplakia of larynx 11/15/2016   Long-term use of aspirin therapy 10/02/2018   Lumbar back pain 06/04/2022   Lung nodules 08/17/2022   Malfunction of penile prosthesis (HCC) 01/14/2019   Malignant neoplasm of skin 01/28/2021   Formatting of this note might be different from the original. Jan 22, 2003 Entered By: Aris Lot J Comment: of skin - removed x2 inpast Jan 22, 2003 Entered By: Aris Lot J Comment: of skin - removed x2 inpast   Melanoma of back (HCC)    melanoma on back   Memory loss 03/08/2020   Moderate recurrent major depression (HCC) 03/02/2023   Mucopurulent chronic bronchitis (HCC) 04/08/2023   Other ill-defined and unknown causes of morbidity and mortality 11/13/1993   Formatting  of this note might be different from the original. Jan 31, 2008 Entered By: ARMOUR,ROSS B Comment: lumbar, 8/08 Jan 22, 2003 Entered By: Aris Lot J Comment: x19yrs in work place  quit 62yrs ago in 2000 Jan 22, 2003 Entered  By: Aris Lot J Comment: x30yrs in work place  quit 42yrs ago in 2000 Jan 31, 2008 Entered By: Bennie Pierini B Comment: lumbar, 8/08   PAD (peripheral artery disease) (HCC) 10/18/2017   Peripheral vascular disease (HCC)    iliac artery clot   Peyronie's disease 06/12/2019   PVC (premature ventricular contraction) 10/09/2023   RBBB 10/09/2023   Squamous cell carcinoma of larynx (HCC) 12/13/2016   Throat cancer (HCC)    Tobacco use disorder 06/12/2019   Type 2 diabetes mellitus with hyperglycemia, with long-term current use of insulin (HCC) 07/30/2023   URI, acute 05/31/2023   Urinary urgency 04/08/2023   Ventricular ectopy 11/09/2022   Vertigo 04/08/2023   Vitamin D deficiency 01/13/2023   Past Surgical History:  Procedure Laterality Date   ABDOMINAL ANGIOGRAM N/A 01/13/2015   Procedure: ABDOMINAL ANGIOGRAM;  Surgeon: Larina Earthly, MD;  Location: [facility] CATH LAB;  Service: Cardiovascular;  Laterality: N/A;   APPENDECTOMY     BACK SURGERY     CARDIAC CATHETERIZATION  2000/2012   with stents in 2000   CHOLECYSTECTOMY     COLONOSCOPY     ELBOW SURGERY     bilateral   ESOPHAGOGASTRODUODENOSCOPY     KNEE SURGERY Left    MICROLARYNGOSCOPY WITH CO2 LASER AND EXCISION OF VOCAL CORD LESION N/A 11/23/2016   Procedure: MICROLARYNGOSCOPY  AND EXCISION OF VOCAL CORD LESION;  Surgeon: Suzanna Obey, MD;  Location: [facility] OR;  Service: ENT;  Laterality: N/A;   MICROLARYNGOSCOPY WITH CO2 LASER AND EXCISION OF VOCAL CORD LESION N/A 01/11/2017   Procedure: MICROLARYNGOSCOPY WITH CO2 LASER AND EXCISION OF VOCAL CORD LESION;  Surgeon: Suzanna Obey, MD;  Location[facility] OR;  Service: ENT;  Laterality: N/A;   MICROLARYNGOSCOPY WITH LASER N/A 03/16/2015   Procedure: MICROLARYNGOSCOPY ;  Surgeon: Suzanna Obey, MD;  Location: [facility] OR;  Service: ENT;  Laterality: N/A;   peyronie's surgery     SHOULDER SURGERY Right    rotator cuff   SPINE SURGERY  09/2020   cervical surgery. steel plate, bone spurs, allograft.   THROAT SURGERY   1997   cancer removed    Family History  Problem Relation Age of Onset   Cancer Mother        bone   Hypertension Mother    Stroke Father    Hypertension Father    Healthy Child    Cancer Other        brain   Diabetes Other    Social History   Socioeconomic History   Marital status: Married    Spouse name: Not on file   Number of children: 4   Years of education: Not on file   Highest education level: GED or equivalent  Occupational History   Occupation: retired  Tobacco Use   Smoking status: Every Day    Current packs/day: 1.00    Average packs/day: 1 pack/day for 50.0 years (50.0 ttl pk-yrs)    Types: Cigarettes   Smokeless tobacco: Never   Tobacco comments:    down to .5 ppd  11/02/2022 hfb  Vaping Use   Vaping status: Former  Substance and Sexual Activity   Alcohol use: Not on file   Drug use: No   Sexual activity: Not on file  Other Topics Concern   Not on file  Social History Narrative   Lives with wife       One level      Right hand   Social Drivers of Health   Financial Resource Strain: Low Risk  (11/12/2023)   Overall Financial Resource Strain (CARDIA)    Difficulty of Paying Living Expenses: Not hard at all  Food Insecurity: No Food Insecurity (11/12/2023)   Hunger Vital Sign    Worried About Running Out of Food in the Last Year: Never true    Ran Out of Food in the Last Year: Never true  Transportation Needs: No Transportation Needs (11/12/2023)   PRAPARE - Administrator, Civil Service (Medical): No    Lack of Transportation (Non-Medical): No  Physical Activity: Unknown (11/12/2023)   Exercise Vital Sign    Days of Exercise per Week: 0 days    Minutes of Exercise per Session: Not on file  Recent Concern: Physical Activity - Inactive (11/12/2023)   Exercise Vital Sign    Days of Exercise per Week: 0 days    Minutes of Exercise per Session: 30 min  Stress: No Stress Concern Present (11/12/2023)   [employer] of  Occupational Health - Occupational Stress Questionnaire    Feeling of Stress : Only a little  Social Connections: Socially Isolated (11/12/2023)   Social Connection and Isolation Panel [NHANES]    Frequency of Communication with Friends and Family: Once a week    Frequency of Social Gatherings with Friends and Family: Never    Attends Religious Services: Never    Database administrator or Organizations: No    Attends Engineer, structural: Not on file    Marital Status: Married    Objective:  There were no vitals taken for this visit.     01/08/2024    2:50 PM 12/12/2023    3:22 PM 11/29/2023    9:32 AM  BP/Weight  Systolic BP 157 132 110  Diastolic BP 80 64 68  Wt. (Lbs) 198 198 201.4  BMI 28.41 kg/m2 28.41 kg/m2 28.9 kg/m2    Physical Exam  Diabetic Foot Exam - Simple   No data filed      Lab Results  Component Value Date   WBC 12.3 (H) 11/29/2023   HGB 12.7 (L) 11/29/2023   HCT 38.5 11/29/2023   PLT 196 11/29/2023   GLUCOSE 192 (H) 11/29/2023   CHOL 119 07/06/2023   TRIG 297 (H) 07/06/2023   HDL 27 (L) 07/06/2023   LDLCALC 46 07/06/2023   ALT 11 11/29/2023   AST 11 11/29/2023   NA 140 11/29/2023   K 4.2 11/29/2023   CL 100 11/29/2023   CREATININE 0.72 (L) 11/29/2023   BUN 9 11/29/2023   CO2 26 11/29/2023   TSH 2.200 05/31/2023   INR 1.00 11/14/2023   HGBA1C 8.1 (H) 07/06/2023   MICROALBUR 30 05/23/2021      Assessment & Plan:  Assessment and Plan               There are no diagnoses linked to this encounter.   No orders of the defined types were placed in this encounter.   No orders of the defined types were placed in this encounter.    Follow-up: No follow-ups on file.   I,Eryka Dolinger,acting as a Neurosurgeon for Blane Ohara, MD.,have documented all relevant documentation on the behalf of Blane Ohara, MD,as directed by  Blane Ohara, MD while in  the presence of Blane Ohara, MD.   An After Visit Summary was printed and given to the  patient.  Blane Ohara, MD [facility] [phone]

## 2024-01-23 ENCOUNTER — Ambulatory Visit: Payer: Medicare Other | Admitting: Family Medicine

## 2024-01-24 ENCOUNTER — Inpatient Hospital Stay (HOSPITAL_BASED_OUTPATIENT_CLINIC_OR_DEPARTMENT_OTHER): Admission: RE | Admit: 2024-01-24 | Payer: Medicare Other | Source: Ambulatory Visit | Admitting: Radiology

## 2024-01-24 DIAGNOSIS — R053 Chronic cough: Secondary | ICD-10-CM | POA: Diagnosis not present

## 2024-01-24 DIAGNOSIS — R0609 Other forms of dyspnea: Secondary | ICD-10-CM | POA: Diagnosis not present

## 2024-01-24 DIAGNOSIS — R918 Other nonspecific abnormal finding of lung field: Secondary | ICD-10-CM | POA: Diagnosis not present

## 2024-01-24 DIAGNOSIS — Z7952 Long term (current) use of systemic steroids: Secondary | ICD-10-CM | POA: Diagnosis not present

## 2024-01-24 DIAGNOSIS — F17210 Nicotine dependence, cigarettes, uncomplicated: Secondary | ICD-10-CM | POA: Diagnosis not present

## 2024-01-24 DIAGNOSIS — J449 Chronic obstructive pulmonary disease, unspecified: Secondary | ICD-10-CM | POA: Diagnosis not present

## 2024-01-24 DIAGNOSIS — R0602 Shortness of breath: Secondary | ICD-10-CM | POA: Diagnosis not present

## 2024-01-25 ENCOUNTER — Telehealth: Admitting: Family Medicine

## 2024-01-25 VITALS — BP 127/60 | HR 60

## 2024-01-25 DIAGNOSIS — R911 Solitary pulmonary nodule: Secondary | ICD-10-CM

## 2024-01-25 DIAGNOSIS — R9389 Abnormal findings on diagnostic imaging of other specified body structures: Secondary | ICD-10-CM | POA: Diagnosis not present

## 2024-01-25 DIAGNOSIS — J410 Simple chronic bronchitis: Secondary | ICD-10-CM | POA: Diagnosis not present

## 2024-01-25 DIAGNOSIS — T380X5A Adverse effect of glucocorticoids and synthetic analogues, initial encounter: Secondary | ICD-10-CM

## 2024-01-25 DIAGNOSIS — G720 Drug-induced myopathy: Secondary | ICD-10-CM

## 2024-01-25 NOTE — Patient Instructions (Signed)
 Recommend decrease prednisone to 5 mg daily Order ct of chest.

## 2024-01-25 NOTE — Progress Notes (Unsigned)
 Virtual Visit via Video Note   This visit type was conducted per patient request This format is felt to be appropriate for this patient at this time.  All issues noted in this document were discussed and addressed.  A limited physical exam was performed with this format.  A verbal consent was obtained for the virtual visit.   Date:  02/01/2024   ID:  Don [name], DOB May 07, 1951, MRN [id number]  Patient Location: Home Provider Location: Office/Clinic  PCP:  Blane Ohara, MD   Chief Complaint  Patient presents with   COPD    6 week follow up     History of Present Illness:   The patient wife tested positive for FLU, so patient had to switch to a my chart visit.  COPD: Patient has done great since been on prednisone 10 mg daily.  He required one prednisone burst during the 6 weeks.  He has not been able to get the percussion vest. Patient has failed on flutter device. He Is on chronic steroids. He saw [facility]ology for a second opinion. They had agreed with his current treatment. 3 nodules in upper lobes.   Plan from [facility] pul[facility]ology note: Chronic Obstructive Pulmonary Disease (COPD) Severe COPD with significant air trapping and obstruction, frequent exacerbations, and hospitalizations. Currently on nebulized therapies including Brovana, Pulmicort, Yupelri, and DuoNeb during flare-ups. Also on dupixent. Previously on azithromycin. Uses home oxygen as needed. Reduced smoking from a pack to half a pack per day since Thanksgiving. Now on daily prednisone 10 mg, which has stabilized symptoms.  - recommended pulmonary rehabilitation but unable to drive himself and wife is still working - recommended in-lab sleep study to see if he needs CPAP vs BiPAP therapy to improve respiratory mechanics and reduce CO2 retention.  - Lung volume reduction procedures discussed, contingent on smoking cessation and pulmonary rehab participation.  - Encouraged smoking cessation with the use of Chantix  and nicotine patches --> prescribed today - Continue current nebulized therapies and home oxygen as needed. - agree with starting ensifentrine once approved. He is otherwise on all approved therapies for COPD at this time. He must stop smoking to achieve any benefit. Could consider trial of roflimulast   Pulmonary Nodules Recent CT scan in January showed new pulmonary nodules, likely related to recent infection. Previous nodules were calcified. History of melanoma and throat cancer increases risk for malignancy. Follow-up imaging recommended to assess changes in nodules. - should have repeat CT in 3-6 months from original -- due soon. Will have this ordered and done locally  Follow-up - Follow up with local pulmonologist and primary care provider for ongoing management. - Ensure repeat chest CT is scheduled locally.     Past Medical History:  Diagnosis Date   Absence of bladder continence 01/08/2022   Acute cough 07/07/2023   Acute infection of nasal sinus 01/13/2023   Anxiety state 02/02/2022   Arthritis    Asymptomatic LV dysfunction 10/18/2017   Atherosclerosis of aorta (HCC) 01/13/2023   Atypical chest pain 11/09/2022   B12 deficiency anemia 08/25/2021   Basal cell carcinoma    Benign prostatic hyperplasia with incomplete bladder emptying 01/14/2019   Bone pain 03/08/2020   Cancer (HCC)    throat - 1997, throat - 2018   Cardiomyopathy, secondary (HCC) 12/01/2016   Overview:  EF 47% 12/26/16   Chest pain syndrome 11/02/2017   Chronic coronary artery disease 10/18/2017   Chronic ischemic heart disease 11/13/1998   Jan 22, 2003 Entered  By: MANI,ALEYAMMA J Comment:  massive Mi in 2000 per patient hsitory, 2nd Mi in 2001Mar 11, 2004 Entered By: Aris Lot J Comment: Had stent placedin 2000,cath/angio in 2001   Chronic neck pain 05/16/2022   Chronic pain syndrome 06/04/2022   Chronic respiratory failure with hypoxia (HCC) 05/16/2022   CKD (chronic kidney disease) stage 3, GFR  30-59 ml/min (HCC) 07/25/2019   COPD (chronic obstructive pulmonary disease) (HCC)    COPD exacerbation (HCC) 07/30/2023   Coronary artery disease involving native coronary artery of native heart with angina pectoris (HCC) 12/01/2016   Overview:  He has hx of IWMI in remote past, last cath in 2012 [facility] showed chronic total occlusion of previously stented RCA, with good collaterals, a 40% LAD stenosis, inferior hypokinesis and EF 45%.    He's been lost to Cardiology f/u since 2015, but has not had any recurrent events   Deficiency anemia 08/25/2021   Diabetic polyneuropathy associated with type 2 diabetes mellitus (HCC) 05/16/2022   Dyslipidemia 12/01/2016   Encounter for Medicare annual wellness exam 01/13/2023   Erectile dysfunction due to diseases classified elsewhere 06/12/2019   GERD (gastroesophageal reflux disease)    Hallucination 10/08/2022   Hyperlipidemia    Hypertension    Hypertensive heart disease with heart failure (HCC)    Insomnia 01/28/2021   Iron deficiency 07/07/2023   Iron deficiency anemia due to chronic blood loss 08/25/2021   Lesion of vocal cord    Leukoplakia of larynx 11/15/2016   Long-term use of aspirin therapy 10/02/2018   Lumbar back pain 06/04/2022   Lung nodules 08/17/2022   Malfunction of penile prosthesis (HCC) 01/14/2019   Malignant neoplasm of skin 01/28/2021   Formatting of this note might be different from the original. Jan 22, 2003 Entered By: Aris Lot J Comment: of skin - removed x2 inpast Jan 22, 2003 Entered By: Aris Lot J Comment: of skin - removed x2 inpast   Melanoma of back (HCC)    melanoma on back   Memory loss 03/08/2020   Moderate recurrent major depression (HCC) 03/02/2023   Mucopurulent chronic bronchitis (HCC) 04/08/2023   Other ill-defined and unknown causes of morbidity and mortality 11/13/1993   Formatting of this note might be different from the original. Jan 31, 2008 Entered By: ARMOUR,ROSS B Comment: lumbar, 8/08  Jan 22, 2003 Entered By: Aris Lot J Comment: x41yrs in work place  quit 27yrs ago in 2000 Jan 22, 2003 Entered By: Aris Lot J Comment: x7yrs in work place  quit 38yrs ago in 2000 Jan 31, 2008 Entered By: Bennie Pierini B Comment: lumbar, 8/08   PAD (peripheral artery disease) (HCC) 10/18/2017   Peripheral vascular disease (HCC)    iliac artery clot   Peyronie's disease 06/12/2019   PVC (premature ventricular contraction) 10/09/2023   RBBB 10/09/2023   Squamous cell carcinoma of larynx (HCC) 12/13/2016   Throat cancer (HCC)    Tobacco use disorder 06/12/2019   Type 2 diabetes mellitus with hyperglycemia, with long-term current use of insulin (HCC) 07/30/2023   URI, acute 05/31/2023   Urinary urgency 04/08/2023   Ventricular ectopy 11/09/2022   Vertigo 04/08/2023   Vitamin D deficiency 01/13/2023    Past Surgical History:  Procedure Laterality Date   ABDOMINAL ANGIOGRAM N/A 01/13/2015   Procedure: ABDOMINAL ANGIOGRAM;  Surgeon: Larina Earthly, MD;  Location: [facility] CATH LAB;  Service: Cardiovascular;  Laterality: N/A;   APPENDECTOMY     BACK SURGERY     CARDIAC CATHETERIZATION  2000/2012   with stents in 2000  CHOLECYSTECTOMY     COLONOSCOPY     ELBOW SURGERY     bilateral   ESOPHAGOGASTRODUODENOSCOPY     KNEE SURGERY Left    MICROLARYNGOSCOPY WITH CO2 LASER AND EXCISION OF VOCAL CORD LESION N/A 11/23/2016   Procedure: MICROLARYNGOSCOPY  AND EXCISION OF VOCAL CORD LESION;  Surgeon: Suzanna Obey, MD;  Location: [facility] OR;  Service: ENT;  Laterality: N/A;   MICROLARYNGOSCOPY WITH CO2 LASER AND EXCISION OF VOCAL CORD LESION N/A 01/11/2017   Procedure: MICROLARYNGOSCOPY WITH CO2 LASER AND EXCISION OF VOCAL CORD LESION;  Surgeon: Suzanna Obey, MD;  Location: [facility] OR;  Service: ENT;  Laterality: N/A;   MICROLARYNGOSCOPY WITH LASER N/A 03/16/2015   Procedure: MICROLARYNGOSCOPY ;  Surgeon: Suzanna Obey, MD;  Location: [facility] OR;  Service: ENT;  Laterality: N/A;   peyronie's surgery     SHOULDER SURGERY Right     rotator cuff   SPINE SURGERY  09/2020   cervical surgery. steel plate, bone spurs, allograft.   THROAT SURGERY  1997   cancer removed    Family History  Problem Relation Age of Onset   Cancer Mother        bone   Hypertension Mother    Stroke Father    Hypertension Father    Healthy Child    Cancer Other        brain   Diabetes Other     Social History   Socioeconomic History   Marital status: Married    Spouse name: Not on file   Number of children: 4   Years of education: Not on file   Highest education level: GED or equivalent  Occupational History   Occupation: retired  Tobacco Use   Smoking status: Every Day    Current packs/day: 1.00    Average packs/day: 1 pack/day for 50.0 years (50.0 ttl pk-yrs)    Types: Cigarettes   Smokeless tobacco: Never   Tobacco comments:    down to .5 ppd  11/02/2022 hfb  Vaping Use   Vaping status: Former  Substance and Sexual Activity   Alcohol use: Not on file   Drug use: No   Sexual activity: Not on file  Other Topics Concern   Not on file  Social History Narrative   Lives with wife       One level      Right hand   Social Drivers of Health   Financial Resource Strain: Low Risk  (11/12/2023)   Overall Financial Resource Strain (CARDIA)    Difficulty of Paying Living Expenses: Not hard at all  Food Insecurity: No Food Insecurity (11/12/2023)   Hunger Vital Sign    Worried About Running Out of Food in the Last Year: Never true    Ran Out of Food in the Last Year: Never true  Transportation Needs: No Transportation Needs (11/12/2023)   PRAPARE - Administrator, Civil Service (Medical): No    Lack of Transportation (Non-Medical): No  Physical Activity: Unknown (11/12/2023)   Exercise Vital Sign    Days of Exercise per Week: 0 days    Minutes of Exercise per Session: Not on file  Recent Concern: Physical Activity - Inactive (11/12/2023)   Exercise Vital Sign    Days of Exercise per Week: 0 days     Minutes of Exercise per Session: 30 min  Stress: No Stress Concern Present (11/12/2023)   [employer] of Occupational Health - Occupational Stress Questionnaire    Feeling of Stress : Only  a little  Social Connections: Socially Isolated (11/12/2023)   Social Connection and Isolation Panel [NHANES]    Frequency of Communication with Friends and Family: Once a week    Frequency of Social Gatherings with Friends and Family: Never    Attends Religious Services: Never    Database administrator or Organizations: No    Attends Engineer, structural: Not on file    Marital Status: Married  Catering manager Violence: Not At Risk (01/11/2023)   Humiliation, Afraid, Rape, and Kick questionnaire    Fear of Current or Ex-Partner: No    Emotionally Abused: No    Physically Abused: No    Sexually Abused: No    Outpatient Medications Prior to Visit  Medication Sig Dispense Refill   arformoterol (BROVANA) 15 MCG/2ML NEBU Take 2 mLs (15 mcg total) by nebulization 2 (two) times daily. 120 mL 6   budesonide (PULMICORT) 0.25 MG/2ML nebulizer solution Take 2 mLs (0.25 mg total) by nebulization in the morning and at bedtime. 60 mL 11   clopidogrel (PLAVIX) 75 MG tablet Take 1 tablet by mouth once daily 90 tablet 0   cyanocobalamin (VITAMIN B12) 1000 MCG/ML injection INJECT 1 ML (CC) INTRAMUSCULARLY ONCE A WEEK (Patient taking differently: Inject 100 mcg into the muscle every 30 (thirty) days.) 10 mL 0   cyclobenzaprine (FLEXERIL) 10 MG tablet Take 1 tablet by mouth three times daily as needed for muscle spasm 90 tablet 0   DULoxetine (CYMBALTA) 60 MG capsule Take 1 capsule (60 mg total) by mouth 2 (two) times daily. 180 capsule 0   Dupilumab (DUPIXENT) 300 MG/2ML SOAJ Inject 300 mg into the skin every 14 (fourteen) days. 12 mL 1   fenofibrate 160 MG tablet Take 1 tablet by mouth once daily 90 tablet 0   finasteride (PROSCAR) 5 MG tablet Take 1 tablet by mouth once daily 90 tablet 0   furosemide  (LASIX) 20 MG tablet Take 1 tablet by mouth once daily 90 tablet 0   gabapentin (NEURONTIN) 400 MG capsule TAKE 1 CAPSULE BY MOUTH THREE TIMES DAILY 270 capsule 1   insulin lispro (HUMALOG) 100 UNIT/ML injection Inject 0.04 mLs (4 Units total) into the skin 3 (three) times daily with meals. 10 mL 11   Insulin Pen Needle (PEN NEEDLES) 31G X 8 MM MISC Inject insulin as directed 100 each 1   Insulin Syringe-Needle U-100 31G X 5/16" 0.3 ML MISC Inject insulin as directed 100 each 0   ipratropium-albuterol (DUONEB) 0.5-2.5 (3) MG/3ML SOLN USE 1 AMPULE IN NEBULIZER 4 TIMES DAILY 360 mL 1   Lancets (ONETOUCH DELICA PLUS LANCET33G) MISC USE 1  TO CHECK GLUCOSE THREE TIMES DAILY (Patient taking differently: 1 each by Other route 3 (three) times daily.) 100 each 0   levofloxacin (LEVAQUIN) 250 MG tablet Take 1 tablet (250 mg total) by mouth daily. 7 tablet 0   levothyroxine (SYNTHROID) 25 MCG tablet Take 1 tablet by mouth once daily 90 tablet 0   losartan (COZAAR) 50 MG tablet Take 1 tablet (50 mg total) by mouth daily. 90 tablet 2   MAG64 64 MG TBEC Take 1 tablet by mouth once daily 90 tablet 0   magnesium chloride (SLOW-MAG) 64 MG TBEC SR tablet Take 1 tablet (64 mg total) by mouth daily. 90 tablet 3   metFORMIN (GLUCOPHAGE) 1000 MG tablet TAKE 1 TABLET BY MOUTH TWICE DAILY WITH MEALS 180 tablet 0   metoprolol succinate (TOPROL-XL) 100 MG 24 hr tablet Take 1 tablet (  100 mg total) by mouth daily. Take with or immediately following a meal. 90 tablet 2   MYRBETRIQ 25 MG TB24 tablet Take 1 tablet by mouth once daily (Patient taking differently: Take 25 mg by mouth daily.) 30 tablet 0   nitroGLYCERIN (NITROSTAT) 0.4 MG SL tablet Place 1 tablet (0.4 mg total) under the tongue every 5 (five) minutes as needed for chest pain. DISSOLVE ONE TABLET UNDER THE TONGUE EVERY 5 TO 10 MINUTES PRIOR TO ACTIVITIES WHICH MIGHT PRECIPITATE AN ATTACK 25 tablet 0   nystatin cream (MYCOSTATIN) Apply 1 application topically 2 (two)  times daily as needed for dry skin.     Omega-3 Fatty Acids (FISH OIL) 1000 MG CAPS Take 2 capsules (2,000 mg total) by mouth in the morning and at bedtime. 180 capsule 12   omeprazole (PRILOSEC) 20 MG capsule Take 1 capsule by mouth once daily 90 capsule 0   ONETOUCH ULTRA test strip USE 1 STRIP TO CHECK GLUCOSE IN THE MORNING AND 1 AT BEDTIME 100 each 0   oxyCODONE-acetaminophen (PERCOCET/ROXICET) 5-325 MG tablet Take 1 tablet by mouth every 6 (six) hours as needed for severe pain (pain score 7-10). 120 tablet 0   predniSONE (DELTASONE) 10 MG tablet Take 1 tablet (10 mg total) by mouth daily with breakfast. 30 tablet 1   predniSONE (DELTASONE) 20 MG tablet Take 2 tablets (40 mg total) by mouth daily with breakfast. 14 tablet 0   revefenacin (YUPELRI) 175 MCG/3ML nebulizer solution Take 3 mLs (175 mcg total) by nebulization daily. 90 mL 11   rosuvastatin (CRESTOR) 40 MG tablet Take 1 tablet by mouth once daily 90 tablet 0   spironolactone (ALDACTONE) 25 MG tablet Take 1 tablet (25 mg total) by mouth daily. 90 tablet 3   tamsulosin (FLOMAX) 0.4 MG CAPS capsule TAKE 2 CAPSULES BY MOUTH ONCE DAILY AFTER SUPPER 180 capsule 0   torsemide (DEMADEX) 20 MG tablet Take 1 tablet (20 mg total) by mouth as needed (edema). 30 tablet 0   TOUJEO SOLOSTAR 300 UNIT/ML Solostar Pen INJECT 30 UNITS SUBCUTANEOUSLY ONCE DAILY 6 mL 0   traZODone (DESYREL) 150 MG tablet Take 1 tablet by mouth once daily 270 tablet 0   VENTOLIN HFA 108 (90 Base) MCG/ACT inhaler INHALE 2 PUFFS BY MOUTH EVERY 6 HOURS AS NEEDED FOR SHORTNESS OF BREATH FOR WHEEZING (Patient taking differently: Inhale 2 puffs into the lungs every 6 (six) hours as needed for shortness of breath or wheezing.) 18 g 0   Vitamin D, Ergocalciferol, (DRISDOL) 1.25 MG (50000 UNIT) CAPS capsule TAKE 1 CAPSULE BY MOUTH TWICE A WEEK (Patient taking differently: Take 50,000 Units by mouth every 7 (seven) days.) 12 capsule 3   ALPRAZolam (XANAX) 0.25 MG tablet Take 1 tablet  (0.25 mg total) by mouth 2 (two) times daily as needed for anxiety. 60 tablet 1   No facility-administered medications prior to visit.    Allergies  Allergen Reactions   Penicillins Rash and Hives    Has patient had a PCN reaction causing immediate rash, facial/tongue/throat swelling, SOB or lightheadedness with hypotension:unsure Has patient had a PCN reaction causing severe rash involving mucus membranes or skin necrosis:unsure Has patient had a PCN reaction that required hospitalization:No Has patient had a PCN reaction occurring within the last 10 years:No If all of the above answers are "NO", then may proceed with Cephalosporin use.  rash   Bactrim [Sulfamethoxazole-Trimethoprim]     Dizziness, confusion, involuntary movements   Doxycycline Rash   Iodinated Contrast  Media Rash    Also developed blisters   Penicillin G Rash   Tramadol Rash     Social History   Tobacco Use   Smoking status: Every Day    Current packs/day: 1.00    Average packs/day: 1 pack/day for 50.0 years (50.0 ttl pk-yrs)    Types: Cigarettes   Smokeless tobacco: Never   Tobacco comments:    down to .5 ppd  11/02/2022 hfb  Vaping Use   Vaping status: Former  Substance Use Topics   Drug use: No     Review of Systems  Constitutional:  Negative for fever and malaise/fatigue.  HENT:  Negative for congestion, ear pain and sore throat.   Respiratory:  Negative for cough and shortness of breath.   Cardiovascular:  Negative for chest pain and leg swelling.  Gastrointestinal:  Negative for abdominal pain.  Genitourinary:  Negative for dysuria.  Musculoskeletal:  Negative for myalgias.  Skin:  Negative for rash.  Neurological:  Negative for weakness and headaches.     Labs/Other Tests and Data Reviewed:    Recent Labs: 05/31/2023: Magnesium 1.6; TSH 2.200 07/06/2023: NT-Pro BNP 665 11/29/2023: ALT 11; BUN 9; Creatinine, Ser 0.72; Hemoglobin 12.7; Platelets 196; Potassium 4.2; Sodium 140   Recent  Lipid Panel Lab Results  Component Value Date/Time   CHOL 119 07/06/2023 11:47 AM   TRIG 297 (H) 07/06/2023 11:47 AM   HDL 27 (L) 07/06/2023 11:47 AM   CHOLHDL 4.4 07/06/2023 11:47 AM   LDLCALC 46 07/06/2023 11:47 AM    Wt Readings from Last 3 Encounters:  01/08/24 198 lb (89.8 kg)  12/12/23 198 lb (89.8 kg)  11/29/23 201 lb 6.4 oz (91.4 kg)     Objective:    Vital Signs:  BP 127/60   Pulse 60   SpO2 94%    Physical Exam Vitals reviewed.  Pulmonary:     Effort: No respiratory distress.  Neurological:     Mental Status: He is alert.      ASSESSMENT & PLAN:    Simple chronic bronchitis (HCC) Assessment & Plan: Recommend decrease prednisone to 5 mg daily Continue Brovana, Pulmicort, Yupelri, and DuoNeb during flare-ups. Pulmonology working on getting ensifentrine approved.    Steroid myopathy Assessment & Plan: Work on getting percussion vest.    Abnormal chest CT Assessment & Plan: Order chest ct (repeat)   Lung nodule   Order chest ct.   Follow Up:  In Person in 3 month(s)    Blane Ohara, MD [facility] [phone]

## 2024-01-26 ENCOUNTER — Other Ambulatory Visit: Payer: Self-pay | Admitting: Family Medicine

## 2024-01-30 ENCOUNTER — Other Ambulatory Visit: Payer: Self-pay | Admitting: Family Medicine

## 2024-01-30 ENCOUNTER — Telehealth: Payer: Self-pay | Admitting: Pharmacist

## 2024-01-30 ENCOUNTER — Encounter: Payer: Self-pay | Admitting: Family Medicine

## 2024-01-30 MED ORDER — ONDANSETRON HCL 4 MG PO TABS: 4.0000 mg | ORAL_TABLET | Freq: Three times a day (TID) | ORAL | 0 refills | Status: DC | PRN

## 2024-01-30 NOTE — Telephone Encounter (Signed)
 Received a fax from  [employer] regarding an approval for DUPIXENT patient assistance from 01/30/2024 to 11/12/2024. Approval letter sent to scan center.  Phone #: [phone] Fax #: [phone]  Chesley Mires, PharmD, MPH, BCPS, CPP Clinical Pharmacist (Rheumatology and Pulmonology)

## 2024-01-31 ENCOUNTER — Other Ambulatory Visit: Payer: Self-pay

## 2024-01-31 ENCOUNTER — Ambulatory Visit: Payer: Medicare Other | Admitting: Internal Medicine

## 2024-01-31 DIAGNOSIS — R197 Diarrhea, unspecified: Secondary | ICD-10-CM

## 2024-01-31 NOTE — Progress Notes (Deleted)
 IOV 04/05/2018  Chief Complaint  Patient presents with   Consult    Referred by Dr. Blane [name] due to COPD. Pt was diagnosed in 2017 and states symptoms have become worse. Pt does have c/o SOB with exertion and also cough first thing in morning.    Don [name] 73 y.o. male from [address] [place] [address] -presents for new evaluation.  His wife and that is a good historian and gives most of the history.  History is gained from him and review of the old chart.  I also visualized the CT scan images from April 2019 and all the way back through April 2017 personally and agree with the final report  He is an ongoing smoker with a previous history of throat cancer and also chronic systolic heart failure status post cardiac stent for many many years with a stable ejection fraction of 35% being followed at [facility].  For the last 3 years he has had worsening shortness of breath.  Initially given a diagnosis of COPD not otherwise specified.  Wife says that he has had progressive shortness of breath since then.  His maintenance inhaler the Spiriva but he is also on Pulmicort nebulizers twice daily which is not mentioned in his med list.  For the last 3 years except for the year 2018 he has had admissions once a year for COPD exacerbation.  He also goes multiple rounds of prednisone and antibiotics for outpatient COPD exacerbation.  Most recent prednisone antibiotic was in December 2018.  They are frustrated with the repeated flareups.  Shortness of breath is associate with cough and wheezing.  This class III in exertion severity.  Walking from the bedroom to the porch makes him short of breath and relieved by rest there is no associated chest pain.  Also this past year he has had increasing unsteadiness of his lower extremities which is attributed to back issues and apparently he has been to neurology and spine surgery and a spine surgeon is having a plan for undergoing to our spinal surgery and   [facility].  They are asking about preoperative respiratory assessment as well.     Walking desaturation test today.  We normally 285 feet x 3 laps on room air: He only did one lap.  He needed 2 people to walk with him.  He was extremely unsteady on his lower extremities.  He did not desaturate he just became tachycardic.  Pulse ox was held up in the high 90s   Chronic obstructive pulmonary disease, unspecified COPD type (HCC)  - copd is stable at this point - continue o2 at night - continue spiriva daily - I recommend adding breo to the regimen scheduled daily - recommend using duoneb as needed  - recommend holding off pulmicort - recommend you talk to cadiologist and change lisinopril to another class; and see if cough improved - later at followu[p can discuss role of roflumilast in preventing flareups - ideally you also need pulmonary rehab but right now wobbly legs need to be addressed first  Preoperative respiratory examination - Moderate risk for any pulmonary complication such as pneumonia following surgery - Low moderate risk for prolonged ventilator dependence  - At time of surgery ideal is that he is flare up free for a month and has quit smoking for a month - this helps reduce risk further  Physical deconditioning  - this is a major issue more than copd causing you to be fatigued  and winded - only sorting out the back can improve this  Multiple lung nodules on CT - stable April 2017 - April 2019 - followup per throat cancer doctor I suppose   Followup  - 6-8 weeks to see response   OV 08/12/2018     HPI Don [name] 73 y.o. -presents for COPD follow-up.  Presents with his wife.  Since his last visit he has had shoulder and spine surgery successfully.  He continues to smoke however.  In the last 1 month wife is reporting deterioration in cough wheezing and dyspnea and chest tightness.  Sputum is changed from white in color to yellow-brown.  She is  very frustrated by the deterioration.  She agrees that lisinopril can be contributing to cough because she herself suffered from that.  Nevertheless she is frustrated extremely.  She is asking if any medicines can prevent COPD exacerbations.  Review of the chart indicates April 2019 he did have CT scan of the chest which showed chronic stable nodules.  He has throat cancer.  He has some occasional dysphagia.  The wife is wondering if it is because of thick mucus.  Apparently his esophagus has been stretch.  ENT appointment is pending today.  CAT score is worse at 27.  He continues to smoke.   08/17/2022: OV with [name],NP for follow-up visit.  Last seen November 2020.  Underlying COPD.  Currently on Breo and Spiriva.  Having difficulty using his inhalers.  Does have some memory impairment.  Also feels like he cannot get enough of the medicine out of the inhalers.  Just feels like his breathing is gotten slowly worse over the last couple years.  Gets short of breath with minimal activities.  Lives a relatively sedentary lifestyle.  Does continue to smoke.  Discussed smoking cessation.  He is on 2 L of oxygen at bedtime.  Spirometry in the office showed FEV1 of 31% and ratio 42.  Formal PFTs ordered for further evaluation.  Changed to triple therapy nebulizer regimen to see if we can have better control over his symptoms.  Pulmonary nodules identified on previous CT scan.  Plan for repeat-ordered at visit.  11/02/2022: Today-follow-up Patient presents today for follow-up after undergoing pulmonary function testing.  He has severe obstructive defect with reversibility and severe diffusion defect.  He has been doing a little bit better with triple therapy regimen.  Feels like he is actually getting the medicine and it opens up his chest little bit.  Continues to have daily symptoms of dyspnea, which limit his activities.  He also continues to have daily symptoms of productive cough with white to brown sputum.   This is baseline for him.  He has had multiple exacerbations over the past year requiring antibiotics and steroids.  His wife wants to know if we can put him on something to help prevent future flareups.  No increased chest congestion or wheezing.  No hemoptysis, weight loss or anorexia.  CT scan results are not available yet.  He does continue to smoke daily; down to 4 cigarettes a day.  He has tried multiple different things in the past to help him quit without any benefit.  OV 01/04/2023  Subjective:  Patient ID: Don [name], male , DOB: 1951-07-22 , age 89 y.o. , MRN: [id number] , ADDRESS: [address] [place] [address] PCP Don Ohara, MD Patient Care Team: Don Ohara, MD as PCP - General (Family Medicine) Revankar, Aundra Dubin, MD as PCP - Cardiology (  Cardiology) Baldo Daub, MD as Referring Physician (Cardiology) Suzanna Obey, MD as Consulting Physician (Otolaryngology) Zettie Pho, [facility] as Pharmacist (Pharmacist)  This Provider for this visit: Treatment Team:  Attending Provider: Kalman Shan, MD    01/04/2023 -   Chief Complaint  Patient presents with   Follow-up    Patient states he can't breathe. Patient coughing up yellow and white phlegm.     HPI Don [name] 73 y.o. -returns for follow-up.  I personally not seen him in a few years.  He has been seeing nurse practitioner multiple times in 2023.  According to him and his wife.  Wife is acting as an independent historian he had multiple exacerbations at least 7 or 9 in 2023 is requiring prednisone and antibiotics.  They insist that Levaquin and prednisone each for 7 days is the only thing that works for him.  They will not accept any other antibiotic therapy.  For this routine visit there endorsing that he is feeling worse for the last 2 days they feel there is a flareup.  They feel there is an antibiotic and prednisone indicated.  They specifically want Levaquin and prednisone.  Wife also believes  that he might be aspirating and this might be a risk factor.  Review of the records indicate that he had high blood eosinophils in the past.  In the past he has been on Daliresp to prevent exacerbations this apparently worked well but it is expensive.  He never been on azithromycin.  Never been on Dupixent.    CT Chest data 10/31/22  Narrative & Impression  CLINICAL DATA:  COPD, dyspnea on exertion. History of throat cancer and diabetes.   EXAM: CT CHEST WITHOUT CONTRAST   TECHNIQUE: Multidetector CT imaging of the chest was performed following the standard protocol without IV contrast.   RADIATION DOSE REDUCTION: This exam was performed according to the departmental dose-optimization program which includes automated exposure control, adjustment of the mA and/or kV according to patient size and/or use of iterative reconstruction technique.   COMPARISON:  CT 04/30/2020.  Radiographs 08/17/2022.   FINDINGS: Cardiovascular: Atherosclerosis of the aorta, great vessels and coronary arteries. No acute vascular findings are seen on noncontrast imaging. The heart size is normal. There is stable anterior pericardial thickening versus a small amount of pericardial fluid.   Mediastinum/Nodes: There are no enlarged mediastinal, hilar or axillary lymph nodes.Hilar assessment is limited by the lack of intravenous contrast, although the hilar contours appear unchanged. The thyroid gland, trachea and esophagus demonstrate no significant findings.   Lungs/Pleura: No pleural effusion or pneumothorax. Moderate centrilobular and paraseptal emphysema with stable biapical scarring. Multiple predominately right-sided pulmonary nodules are again noted, including and densely calcified right lower lobe granuloma. The new right upper lobe nodule seen on the most recent CT is stable over the interval, measuring 4 mm on image 23/5. There is new mild clustered nodularity in the left lower lobe on  axial images 70 through 76, likely postinflammatory or mucous impacted terminal bronchioles. No airspace disease.   Upper abdomen: No significant findings are seen within the visualized upper abdomen. There are scattered splenic granulomas. Mildly prominent ingested material in the stomach. Previous cholecystectomy.   Musculoskeletal/Chest wall: There is no chest wall mass or suspicious osseous finding. Mild spondylosis post lower cervical fusion.   IMPRESSION: 1. No acute chest findings identified. 2. New mild clustered nodularity in the left lower lobe, likely postinflammatory or mucous impacted terminal bronchioles. No airspace disease or suspicious  nodularity. 3. Additional scattered small pulmonary nodules are unchanged from priors, consistent with a benign etiology. 4. Sequela of prior granulomatous disease with calcified right lower lobe granuloma and scattered splenic granulomas. 5. Aortic Atherosclerosis (ICD10-I70.0) and Emphysema (ICD10-J43.9).     Electronically Signed   By: Carey Bullocks M.D.   On: 11/03/2022 16:58        Latest Reference Range & Units 12/16/20 09:13 11/02/21 14:00 03/14/22 09:43 05/19/22 08:23 09/29/22 12:39  EOS (ABSOLUTE) 0.0 - 0.4 x10E3/uL 0.3 0.3 0.3 0.3 0.2       OV 02/15/2023  Subjective:  Patient ID: Don [name], male , DOB: 28-Nov-1950 , age 4 y.o. , MRN: [id number] , ADDRESS: [address] [place] [address] PCP Don Ohara, MD Patient Care Team: Don Ohara, MD as PCP - General (Family Medicine) Revankar, Aundra Dubin, MD as PCP - Cardiology (Cardiology) Regan Lemming, MD as PCP - Electrophysiology (Cardiology) Baldo Daub, MD as Referring Physician (Cardiology) Suzanna Obey, MD as Consulting Physician (Otolaryngology) Zettie Pho, [facility] as Pharmacist (Pharmacist)  This Provider for this visit: Treatment Team:  Attending Provider: Julio Sicks, NP    02/15/2023 -   Chief Complaint  Patient  presents with   Follow-up    Thick phlegm, wife wants Dr to hear lungs.       HPI Don [name] 73 y.o. -here to review results and also update on progress.  Since last seeing me including the last visit he has had 4 rounds of antibiotics and prednisone of which 2 was Levaquin 1 with Bactrim and 1 with Keflex please.  Most recent antibiotic completion was yesterday most recent prednisone completion was yesterday.  The wife is frustrated and he is also frustrated.  None of these treatment rounds are helping.  The phlegm is very thick.  He has to cough to bring the phlegm out.  Vibratory vest helped him.  He chokes because of the thick phlegm.  Most of the time it is white but sometimes it is green-yellow brown.  They try to give sputum sample but I believe the sputum was not sent because there was no order.  She is asking for a chest x-ray at this point in time.  December 2023 CT scan of the chest did not show any lung cancer.  She did have CT sinus March 2024 at [place].  I do not have the images but it shows pansinusitis.  Wife is able to get ENT appointment mid April 2024 at [facility] in [place].  He has a history of throat cancer and has been seen by ENT in the past.  He continues to have hoarse voice.  He also has elevated eosinophils we prescribed Dupixent but this is yet to start.  Because of the high co-pay to try to get it through charity.  Had    CT SINUS MARCH 2024    IMPRESSION: Chronic frontal, ethmoid, sphenoid and a maxillary sinusitis. There are no air-fluid levels. Postsurgical changes are noted in maxillary and ethmoid sinuses.  Cortical sulci in the brain appear prominent suggesting atrophy. Scattered arterial calcifications are seen.   Electronically Signed By: Ernie Avena M.D. On: 02/01/2023 09:48      Latest Reference Range & Units 08/17/22 10:15 01/04/23 12:08  Eosinophils Absolute 0.0 - 0.7 K/uL 0.3 0.3    SEPT 2024 Patient presents today for  acute video visit.  Accompanied by his wife.  Patient has severe COPD with recurrent respiratory infections requiring antibiotics and  prednisone, especially in the winter.  He currently has a cough with yellow mucus.  Last course of prednisone was 1 to 2 weeks ago. He is maintained on Pulmicort, Brovana, Yupelri and Dupixent.  He wears 2L oxygen at night.  Patient and wife are inquiring about starting a daily antibiotic to prevent flareups, they have previously discussed this with Dr. Marchelle Gearing.    Severe COPD with recurrent exacerbations - Starting Azithromycin MWF to decrease flare ups - Sending in prednisone taper for acute symptoms - Continue Pulmicort, Brovana, Yupelri and Dupixent, if no improvement at follow-up may need to consider daily low-dose prednisone and/or starting PDE inhibitor.  Chronic respiratory failure - Continue 2L oxygen at bedtime   Follow Up Instructions:  4-6 weeks with EKG     OV 09/18/2023  Subjective:  Patient ID: Don [name], male , DOB: Nov 04, 1951 , age 22 y.o. , MRN: [id number] , ADDRESS: [address] [place] [address] PCP Don Ohara, MD Patient Care Team: Don Ohara, MD as PCP - General (Family Medicine) Baldo Daub, MD as Referring Physician (Cardiology) Suzanna Obey, MD as Consulting Physician (Otolaryngology) Zettie Pho, [facility] (Inactive) as Pharmacist (Pharmacist)  This Provider for this visit: Treatment Team:  Attending Provider: Kalman Shan, MD    09/18/2023 -   Chief Complaint  Patient presents with   Acute Visit    Pt states his breathing is off. Pt daughter states she think he is best on steroids.      HPI Don [name] 73 y.o. -presents with his wife and had stridor.  She is the main historian.  She tells me that he is now on Dupixent may be exacerbations of a little bit less.  Best Clent [place] for tomorrow azithromycin prophylaxis but this has not helped.  They want to stop it.  He is frustrated by his  back pain.  She says that every time she takes prednisone he feels better and then as the prednisone comes off a few days later he will have a flareup again.  Currently when she made the acute visit he was having an exacerbation but he is off prednisone and is currently well but she worries in few days he will flareup.  I have agreed to give him a prednisone pack.  Did discuss new medication for COPD that improves lung function and prevent flareups.  Send nebulizer OThyuvayre it does help FEV1 improvement relatively at least 200 cc over 6 months.  There is block box warning for increased psychiatric disturbance but they are okay with this risk.  Did indicate to them if this keeps happening then we will do steroids  Other issues - Working with cardiology and cardiac issues his BNP was recently high - Continue smoking: Continues to smoke.  He is struggling to quit.  Offered referral to [facility] quit smoking cessation clinic and he is agreeable to do that. -Echocardiogram September 2020 for EF 55%.    OV 12/10/2023  Subjective:  Patient ID: Don [name], male , DOB: 1950-12-07 , age 49 y.o. , MRN: [id number] , ADDRESS: [address] [place] [address] PCP Don Ohara, MD Patient Care Team: Don Ohara, MD as PCP - General (Family Medicine) Baldo Daub, MD as Referring Physician (Cardiology) Suzanna Obey, MD as Consulting Physician (Otolaryngology) Zettie Pho, [facility] (Inactive) as Pharmacist (Pharmacist)  This Provider for this visit: Treatment Team:  Attending Provider: Kalman Shan, MD    OV 01/31/2024  Subjective:  Patient ID: Don [name],  male , DOB: [birth year] , age 22 y.o. , MRN: [id number] , ADDRESS: [address] [place] [address] PCP Don Ohara, MD Patient Care Team: Don Ohara, MD as PCP - General (Family Medicine) Baldo Daub, MD as Referring Physician (Cardiology) Suzanna Obey, MD as Consulting Physician (Otolaryngology) Zettie Pho, [facility] (Inactive) as Pharmacist (Pharmacist)  This Provider for this visit: Treatment Team:  Attending Provider: Kalman Shan, MD  #Gold stage III COPD with nocturnal oxygen use #COPD frequent exacerbated. # Chronic systolic dysfunction December 2023 EF 45% # Ongoing 50 pack smoker # History of throat cancer # Chronic sinusitis seen on CT scan of the sinus March 2024 at [facility] # Mild chronic elevations in blood eosinophil counts  01/31/2024 -  No chief complaint on file.    HPI Don [name] 73 y.o. -    CT Chest data from date: ****  - personally visualized and independently interpreted : *** - my findings are: ***   PFT     Latest Ref Rng & Units 10/26/2022   10:46 AM  PFT Results  FVC-Pre L 2.32   FVC-Predicted Pre % 58   FVC-Post L 2.84   FVC-Predicted Post % 71   Pre FEV1/FVC % % 48   Post FEV1/FCV % % 44   FEV1-Pre L 1.11   FEV1-Predicted Pre % 38   FEV1-Post L 1.24   DLCO uncorrected ml/min/mmHg 9.16   DLCO UNC% % 38   DLCO corrected ml/min/mmHg 9.59   DLCO COR %Predicted % 40   DLVA Predicted % 48        LAB RESULTS last 96 hours No results found.       has a past medical history of Absence of bladder continence (01/08/2022), Acute cough (07/07/2023), Acute infection of nasal sinus (01/13/2023), Anxiety state (02/02/2022), Arthritis, Asymptomatic LV dysfunction (10/18/2017), Atherosclerosis of aorta (HCC) (01/13/2023), Atypical chest pain (11/09/2022), B12 deficiency anemia (08/25/2021), Basal cell carcinoma, Benign prostatic hyperplasia with incomplete bladder emptying (01/14/2019), Bone pain (03/08/2020), Cancer (HCC), Cardiomyopathy, secondary (HCC) (12/01/2016), Chest pain syndrome (11/02/2017), Chronic coronary artery disease (10/18/2017), Chronic ischemic heart disease (11/13/1998), Chronic neck pain (05/16/2022), Chronic pain syndrome (06/04/2022), Chronic respiratory failure with hypoxia (HCC) (05/16/2022), CKD (chronic kidney  disease) stage 3, GFR 30-59 ml/min (HCC) (07/25/2019), COPD (chronic obstructive pulmonary disease) (HCC), COPD exacerbation (HCC) (07/30/2023), Coronary artery disease involving native coronary artery of native heart with angina pectoris (HCC) (12/01/2016), Deficiency anemia (08/25/2021), Diabetic polyneuropathy associated with type 2 diabetes mellitus (HCC) (05/16/2022), Dyslipidemia (12/01/2016), Encounter for Medicare annual wellness exam (01/13/2023), Erectile dysfunction due to diseases classified elsewhere (06/12/2019), GERD (gastroesophageal reflux disease), Hallucination (10/08/2022), Hyperlipidemia, Hypertension, Hypertensive heart disease with heart failure (HCC), Insomnia (01/28/2021), Iron deficiency (07/07/2023), Iron deficiency anemia due to chronic blood loss (08/25/2021), Lesion of vocal cord, 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), Long-term use of aspirin therapy (10/02/2018), Lumbar back pain (06/04/2022), Lung nodules (08/17/2022), Malfunction of penile prosthesis (HCC) (01/14/2019), Malignant neoplasm of skin (01/28/2021), Melanoma of back (HCC), Memory loss (03/08/2020), Moderate recurrent major depression (HCC) (03/02/2023), Mucopurulent chronic bronchitis (HCC) (04/08/2023), Other ill-defined and unknown causes of morbidity and mortality (11/13/1993), PAD (peripheral artery disease) (HCC) (10/18/2017), Peripheral vascular disease (HCC), Peyronie's disease (06/12/2019), PVC (premature ventricular contraction) (10/09/2023), RBBB (10/09/2023), Squamous cell carcinoma of larynx (HCC) (12/13/2016), Throat cancer (HCC), Tobacco use disorder (06/12/2019), Type 2 diabetes mellitus with hyperglycemia, with long-term current use of insulin (HCC) (07/30/2023), URI, acute (05/31/2023), Urinary urgency (04/08/2023), Ventricular ectopy (11/09/2022), Vertigo (04/08/2023), and Vitamin D deficiency (01/13/2023).  reports that he has been smoking cigarettes. He has a 50 pack-year smoking history. He has never  used smokeless tobacco.  Past Surgical History:  Procedure Laterality Date   ABDOMINAL ANGIOGRAM N/A 01/13/2015   Procedure: ABDOMINAL ANGIOGRAM;  Surgeon: Larina Earthly, MD;  Location: [facility] CATH LAB;  Service: Cardiovascular;  Laterality: N/A;   APPENDECTOMY     BACK SURGERY     CARDIAC CATHETERIZATION  2000/2012   with stents in 2000   CHOLECYSTECTOMY     COLONOSCOPY     ELBOW SURGERY     bilateral   ESOPHAGOGASTRODUODENOSCOPY     KNEE SURGERY Left    MICROLARYNGOSCOPY WITH CO2 LASER AND EXCISION OF VOCAL CORD LESION N/A 11/23/2016   Procedure: MICROLARYNGOSCOPY  AND EXCISION OF VOCAL CORD LESION;  Surgeon: Suzanna Obey, MD;  Location: [facility] OR;  Service: ENT;  Laterality: N/A;   MICROLARYNGOSCOPY WITH CO2 LASER AND EXCISION OF VOCAL CORD LESION N/A 01/11/2017   Procedure: MICROLARYNGOSCOPY WITH CO2 LASER AND EXCISION OF VOCAL CORD LESION;  Surgeon: Suzanna Obey, MD;  Location[facility] OR;  Service: ENT;  Laterality: N/A;   MICROLARYNGOSCOPY WITH LASER N/A 03/16/2015   Procedure: MICROLARYNGOSCOPY ;  Surgeon: Suzanna Obey, MD;  Location: [facility] OR;  Service: ENT;  Laterality: N/A;   peyronie's surgery     SHOULDER SURGERY Right    rotator cuff   SPINE SURGERY  09/2020   cervical surgery. steel plate, bone spurs, allograft.   THROAT SURGERY  1997   cancer removed    Allergies  Allergen Reactions   Penicillins Rash and Hives    Has patient had a PCN reaction causing immediate rash, facial/tongue/throat swelling, SOB or lightheadedness with hypotension:unsure Has patient had a PCN reaction causing severe rash involving mucus membranes or skin necrosis:unsure Has patient had a PCN reaction that required hospitalization:No Has patient had a PCN reaction occurring within the last 10 years:No If all of the above answers are "NO", then may proceed with Cephalosporin use.  rash   Bactrim [Sulfamethoxazole-Trimethoprim]     Dizziness, confusion, involuntary movements   Doxycycline Rash   Iodinated Contrast  Media Rash    Also developed blisters   Penicillin G Rash   Tramadol Rash    Immunization History  Administered Date(s) Administered   Fluad Quad(high Dose 65+) 08/16/2020, 09/01/2021, 08/17/2022   Fluad Trivalent(High Dose 65+) 10/10/2023   H1N1 09/13/2008   Influenza Split 09/01/2021   Influenza, High Dose Seasonal PF 10/13/2017, 08/26/2019, 08/16/2020   Influenza-Unspecified 11/13/2002, 08/19/2004, 09/13/2004, 09/13/2006, 10/17/2007, 08/12/2008, 10/13/2017, 08/26/2019   Moderna SARS-COV2 Booster Vaccination 10/12/2020   Moderna Sars-Covid-2 Vaccination 10/12/2020   PNEUMOCOCCAL CONJUGATE-20 01/11/2023   Pfizer(Comirnaty)Fall Seasonal Vaccine 12 years and older 09/22/2022, 10/10/2023   Pneumococcal Conjugate-13 03/13/2000, 10/01/2015   Pneumococcal Polysaccharide-23 11/13/2002, 08/13/2009   Pneumococcal-Unspecified 03/13/2000   Td 11/13/1996   Td (Adult),5 Lf Tetanus Toxid, Preservative Free 11/13/1996    Family History  Problem Relation Age of Onset   Cancer Mother        bone   Hypertension Mother    Stroke Father    Hypertension Father    Healthy Child    Cancer Other        brain   Diabetes Other      Current Outpatient Medications:    ALPRAZolam (XANAX) 0.25 MG tablet, Take 1 tablet by mouth twice daily as needed for anxiety, Disp: 60 tablet, Rfl: 0   arformoterol (BROVANA) 15 MCG/2ML NEBU, Take 2 mLs (15 mcg total)  by nebulization 2 (two) times daily., Disp: 120 mL, Rfl: 6   budesonide (PULMICORT) 0.25 MG/2ML nebulizer solution, Take 2 mLs (0.25 mg total) by nebulization in the morning and at bedtime., Disp: 60 mL, Rfl: 11   clopidogrel (PLAVIX) 75 MG tablet, Take 1 tablet by mouth once daily, Disp: 90 tablet, Rfl: 0   cyanocobalamin (VITAMIN B12) 1000 MCG/ML injection, INJECT 1 ML (CC) INTRAMUSCULARLY ONCE A WEEK (Patient taking differently: Inject 100 mcg into the muscle every 30 (thirty) days.), Disp: 10 mL, Rfl: 0   cyclobenzaprine (FLEXERIL) 10 MG tablet,  Take 1 tablet by mouth three times daily as needed for muscle spasm, Disp: 90 tablet, Rfl: 0   DULoxetine (CYMBALTA) 60 MG capsule, Take 1 capsule (60 mg total) by mouth 2 (two) times daily., Disp: 180 capsule, Rfl: 0   Dupilumab (DUPIXENT) 300 MG/2ML SOAJ, Inject 300 mg into the skin every 14 (fourteen) days., Disp: 12 mL, Rfl: 1   fenofibrate 160 MG tablet, Take 1 tablet by mouth once daily, Disp: 90 tablet, Rfl: 0   finasteride (PROSCAR) 5 MG tablet, Take 1 tablet by mouth once daily, Disp: 90 tablet, Rfl: 0   furosemide (LASIX) 20 MG tablet, Take 1 tablet by mouth once daily, Disp: 90 tablet, Rfl: 0   gabapentin (NEURONTIN) 400 MG capsule, TAKE 1 CAPSULE BY MOUTH THREE TIMES DAILY, Disp: 270 capsule, Rfl: 1   insulin lispro (HUMALOG) 100 UNIT/ML injection, Inject 0.04 mLs (4 Units total) into the skin 3 (three) times daily with meals., Disp: 10 mL, Rfl: 11   Insulin Pen Needle (PEN NEEDLES) 31G X 8 MM MISC, Inject insulin as directed, Disp: 100 each, Rfl: 1   Insulin Syringe-Needle U-100 31G X 5/16" 0.3 ML MISC, Inject insulin as directed, Disp: 100 each, Rfl: 0   ipratropium-albuterol (DUONEB) 0.5-2.5 (3) MG/3ML SOLN, USE 1 AMPULE IN NEBULIZER 4 TIMES DAILY, Disp: 360 mL, Rfl: 1   Lancets (ONETOUCH DELICA PLUS LANCET33G) MISC, USE 1  TO CHECK GLUCOSE THREE TIMES DAILY (Patient taking differently: 1 each by Other route 3 (three) times daily.), Disp: 100 each, Rfl: 0   levofloxacin (LEVAQUIN) 250 MG tablet, Take 1 tablet (250 mg total) by mouth daily., Disp: 7 tablet, Rfl: 0   levothyroxine (SYNTHROID) 25 MCG tablet, Take 1 tablet by mouth once daily, Disp: 90 tablet, Rfl: 0   losartan (COZAAR) 50 MG tablet, Take 1 tablet (50 mg total) by mouth daily., Disp: 90 tablet, Rfl: 2   MAG64 64 MG TBEC, Take 1 tablet by mouth once daily, Disp: 90 tablet, Rfl: 0   magnesium chloride (SLOW-MAG) 64 MG TBEC SR tablet, Take 1 tablet (64 mg total) by mouth daily., Disp: 90 tablet, Rfl: 3   metFORMIN  (GLUCOPHAGE) 1000 MG tablet, TAKE 1 TABLET BY MOUTH TWICE DAILY WITH MEALS, Disp: 180 tablet, Rfl: 0   metoprolol succinate (TOPROL-XL) 100 MG 24 hr tablet, Take 1 tablet (100 mg total) by mouth daily. Take with or immediately following a meal., Disp: 90 tablet, Rfl: 2   MYRBETRIQ 25 MG TB24 tablet, Take 1 tablet by mouth once daily (Patient taking differently: Take 25 mg by mouth daily.), Disp: 30 tablet, Rfl: 0   nitroGLYCERIN (NITROSTAT) 0.4 MG SL tablet, Place 1 tablet (0.4 mg total) under the tongue every 5 (five) minutes as needed for chest pain. DISSOLVE ONE TABLET UNDER THE TONGUE EVERY 5 TO 10 MINUTES PRIOR TO ACTIVITIES WHICH MIGHT PRECIPITATE AN ATTACK, Disp: 25 tablet, Rfl: 0   nystatin  cream (MYCOSTATIN), Apply 1 application topically 2 (two) times daily as needed for dry skin., Disp: , Rfl:    Omega-3 Fatty Acids (FISH OIL) 1000 MG CAPS, Take 2 capsules (2,000 mg total) by mouth in the morning and at bedtime., Disp: 180 capsule, Rfl: 12   omeprazole (PRILOSEC) 20 MG capsule, Take 1 capsule by mouth once daily, Disp: 90 capsule, Rfl: 0   ondansetron (ZOFRAN) 4 MG tablet, Take 1 tablet (4 mg total) by mouth every 8 (eight) hours as needed for nausea or vomiting., Disp: 40 tablet, Rfl: 0   ONETOUCH ULTRA test strip, USE 1 STRIP TO CHECK GLUCOSE IN THE MORNING AND 1 AT BEDTIME, Disp: 100 each, Rfl: 0   oxyCODONE-acetaminophen (PERCOCET/ROXICET) 5-325 MG tablet, Take 1 tablet by mouth every 6 (six) hours as needed for severe pain (pain score 7-10)., Disp: 120 tablet, Rfl: 0   predniSONE (DELTASONE) 10 MG tablet, Take 1 tablet (10 mg total) by mouth daily with breakfast., Disp: 30 tablet, Rfl: 1   predniSONE (DELTASONE) 20 MG tablet, Take 2 tablets (40 mg total) by mouth daily with breakfast., Disp: 14 tablet, Rfl: 0   revefenacin (YUPELRI) 175 MCG/3ML nebulizer solution, Take 3 mLs (175 mcg total) by nebulization daily., Disp: 90 mL, Rfl: 11   rosuvastatin (CRESTOR) 40 MG tablet, Take 1 tablet  by mouth once daily, Disp: 90 tablet, Rfl: 0   spironolactone (ALDACTONE) 25 MG tablet, Take 1 tablet (25 mg total) by mouth daily., Disp: 90 tablet, Rfl: 3   tamsulosin (FLOMAX) 0.4 MG CAPS capsule, TAKE 2 CAPSULES BY MOUTH ONCE DAILY AFTER SUPPER, Disp: 180 capsule, Rfl: 0   torsemide (DEMADEX) 20 MG tablet, Take 1 tablet (20 mg total) by mouth as needed (edema)., Disp: 30 tablet, Rfl: 0   TOUJEO SOLOSTAR 300 UNIT/ML Solostar Pen, INJECT 30 UNITS SUBCUTANEOUSLY ONCE DAILY, Disp: 6 mL, Rfl: 0   traZODone (DESYREL) 150 MG tablet, Take 1 tablet by mouth once daily, Disp: 270 tablet, Rfl: 0   VENTOLIN HFA 108 (90 Base) MCG/ACT inhaler, INHALE 2 PUFFS BY MOUTH EVERY 6 HOURS AS NEEDED FOR SHORTNESS OF BREATH FOR WHEEZING (Patient taking differently: Inhale 2 puffs into the lungs every 6 (six) hours as needed for shortness of breath or wheezing.), Disp: 18 g, Rfl: 0   Vitamin D, Ergocalciferol, (DRISDOL) 1.25 MG (50000 UNIT) CAPS capsule, TAKE 1 CAPSULE BY MOUTH TWICE A WEEK (Patient taking differently: Take 50,000 Units by mouth every 7 (seven) days.), Disp: 12 capsule, Rfl: 3      Objective:   There were no vitals filed for this visit.  Estimated body mass index is 28.41 kg/m as calculated from the following:   Height as of 01/08/24: 5\' 10"  (1.778 m).   Weight as of 01/08/24: 198 lb (89.8 kg).  @WEIGHTCHANGE @  There were no vitals filed for this visit.   Physical Exam   General: No distress. *** O2 at rest: *** Cane present: *** Sitting in wheel chair: *** Frail: *** Obese: *** Neuro: Alert and Oriented x 3. GCS 15. Speech normal Psych: Pleasant Resp:  Barrel Chest - ***.  Wheeze - ***, Crackles - ***, No overt respiratory distress CVS: Normal heart sounds. Murmurs - *** Ext: Stigmata of Connective Tissue Disease - *** HEENT: Normal upper airway. PEERL +. No post nasal drip        Assessment:     No diagnosis found.     Plan:     There are no Patient Instructions on  file  for this visit.   FOLLOWUP No follow-ups on file.    SIGNATURE    Dr. Kalman Shan, M.D., F.C.C.P,  Pulmonary and Critical Care Medicine Staff Physician, [facility] Director - Interstitial Lung Disease  Program  Pulmonary Fibrosis [facility] at [facility] [place], [place], [address]  Pager: [phone], If no answer or between  15:00h - 7:00h: call [phone] Telephone: [phone]  12:41 PM 01/31/2024   Moderate Complexity MDM OFFICE  2021 E/M guidelines, first released in 2021, with minor revisions added in 2023 and 2024 Must meet the requirements for 2 out of 3 dimensions to qualify.    Number and complexity of problems addressed Amount and/or complexity of data reviewed Risk of complications and/or morbidity  One or more chronic illness with mild exacerbation, OR progression, OR  side effects of treatment  Two or more stable chronic illnesses  One undiagnosed new problem with uncertain prognosis  One acute illness with systemic symptoms   One Acute complicated injury Must meet the requirements for 1 of 3 of the categories)  Category 1: Tests and documents, historian  Any combination of 3 of the following:  Assessment requiring an independent historian  Review of prior external note(s) from each unique source  Review of results of each unique test  Ordering of each unique test    Category 2: Interpretation of tests   Independent interpretation of a test performed by another physician/other qualified health care professional (not separately reported)  Category 3: Discuss management/tests  Discussion of management or test interpretation with external physician/other qualified health care professional/appropriate source (not separately reported) Moderate risk of morbidity from additional diagnostic testing or treatment Examples only:  Prescription drug management  Decision regarding minor surgery  with identfied patient or procedure risk factors  Decision regarding elective major surgery without identified patient or procedure risk factors  Diagnosis or treatment significantly limited by social determinants of health             HIGh Complexity  OFFICE   2021 E/M guidelines, first released in 2021, with minor revisions added in 2023. Must meet the requirements for 2 out of 3 dimensions to qualify.    Number and complexity of problems addressed Amount and/or complexity of data reviewed Risk of complications and/or morbidity  Severe exacerbation of chronic illness  Acute or chronic illnesses that may pose a threat to life or bodily function, e.g., multiple trauma, acute MI, pulmonary embolus, severe respiratory distress, progressive rheumatoid arthritis, psychiatric illness with potential threat to self or others, peritonitis, acute renal failure, abrupt change in neurological status Must meet the requirements for 2 of 3 of the categories)  Category 1: Tests and documents, historian  Any combination of 3 of the following:  Assessment requiring an independent historian  Review of prior external note(s) from each unique source  Review of results of each unique test  Ordering of each unique test    Category 2: Interpretation of tests    Independent interpretation of a test performed by another physician/other qualified health care professional (not separately reported)  Category 3: Discuss management/tests  Discussion of management or test interpretation with external physician/other qualified health care professional/appropriate source (not separately reported)  HIGH risk of morbidity from additional diagnostic testing or treatment Examples only:  Drug therapy requiring intensive monitoring for toxicity  Decision for elective major surgery with identified pateint or procedure risk factors  Decision regarding  hospitalization or escalation of level of  care  Decision for DNR or to de-escalate care   Parenteral controlled  substances            LEGEND - Independent interpretation involves the interpretation of a test for which there is a CPT code, and an interpretation or report is customary. When a review and interpretation of a test is performed and documented by the provider, but not separately reported (billed), then this would represent an independent interpretation. This report does not need to conform to the usual standards of a complete report of the test. This does not include interpretation of tests that do not have formal reports such as a complete blood count with differential and blood cultures. Examples would include reviewing a chest radiograph and documenting in the medical record an interpretation, but not separately reporting (billing) the interpretation of the chest radiograph.   An appropriate source includes professionals who are not health care professionals but may be involved in the management of the patient, such as a Clinical research associate, upper officer, case manager or teacher, and does not include discussion with family or informal caregivers.    - SDOH: SDOH are the conditions in the environments where people are born, live, learn, work, play, worship, and age that affect a wide range of health, functioning, and quality-of-life outcomes and risks. (e.g., housing, food insecurity, transportation, etc.). SDOH-related Z codes ranging from Z55-Z65 are the ICD-10-CM diagnosis codes used to document SDOH data Z55 - Problems related to education and literacy Z56 - Problems related to employment and unemployment Z57 - Occupational exposure to risk factors Z58 - Problems related to physical environment Z59 - Problems related to housing and economic circumstances [phone] - Problems related to social environment [phone] - Problems related to upbringing [phone] - Other problems related to primary support group, including family  circumstances Z7 - Problems related to certain psychosocial circumstances Z65 - Problems related to other psychosocial circumstances

## 2024-02-01 ENCOUNTER — Other Ambulatory Visit: Payer: Self-pay

## 2024-02-01 ENCOUNTER — Other Ambulatory Visit (INDEPENDENT_AMBULATORY_CARE_PROVIDER_SITE_OTHER): Payer: Self-pay | Admitting: Family Medicine

## 2024-02-01 ENCOUNTER — Encounter: Payer: Self-pay | Admitting: Family Medicine

## 2024-02-01 DIAGNOSIS — R9389 Abnormal findings on diagnostic imaging of other specified body structures: Secondary | ICD-10-CM | POA: Insufficient documentation

## 2024-02-01 DIAGNOSIS — T380X5A Adverse effect of glucocorticoids and synthetic analogues, initial encounter: Secondary | ICD-10-CM

## 2024-02-01 DIAGNOSIS — R911 Solitary pulmonary nodule: Secondary | ICD-10-CM

## 2024-02-01 DIAGNOSIS — R197 Diarrhea, unspecified: Secondary | ICD-10-CM

## 2024-02-01 HISTORY — DX: Adverse effect of glucocorticoids and synthetic analogues, initial encounter: T38.0X5A

## 2024-02-01 HISTORY — DX: Abnormal findings on diagnostic imaging of other specified body structures: R93.89

## 2024-02-01 NOTE — Assessment & Plan Note (Signed)
 Order chest ct (repeat)

## 2024-02-01 NOTE — Assessment & Plan Note (Signed)
>>  ASSESSMENT AND PLAN FOR COPD (CHRONIC OBSTRUCTIVE PULMONARY DISEASE) (HCC) WRITTEN ON 02/01/2024 11:25 PM BY COX, KIRSTEN, MD  Recommend decrease prednisone to 5 mg daily Continue Brovana, Pulmicort, Yupelri, and DuoNeb during flare-ups. Pulmonology working on getting ensifentrine  approved.

## 2024-02-01 NOTE — Assessment & Plan Note (Signed)
 Work on getting percussion vest.

## 2024-02-01 NOTE — Assessment & Plan Note (Signed)
 Recommend decrease prednisone to 5 mg daily Continue Brovana, Pulmicort, Yupelri, and DuoNeb during flare-ups. Pulmonology working on getting ensifentrine approved.

## 2024-02-04 ENCOUNTER — Encounter: Payer: Self-pay | Admitting: Internal Medicine

## 2024-02-06 ENCOUNTER — Telehealth: Payer: Self-pay

## 2024-02-06 NOTE — Telephone Encounter (Signed)
 Don [name] wants to know does patient need to be seen before Monday with Huston Foley and want s Dr. Sedalia Muta 's opinion   Patient's wife states patient is choking real easy and sometimes when he drinks it comes right back up. Patient fills like his throat is full. When the drink comes back up it comes back out the nose and mouth as happens at least once a day. When it happens patient get chokes and coughs till he is shortness of breath. A shallow study was done last year per [place].

## 2024-02-07 ENCOUNTER — Other Ambulatory Visit: Payer: Self-pay | Admitting: Family Medicine

## 2024-02-11 ENCOUNTER — Ambulatory Visit: Admitting: Physician Assistant

## 2024-02-13 ENCOUNTER — Other Ambulatory Visit: Payer: Self-pay | Admitting: Family Medicine

## 2024-02-13 DIAGNOSIS — J9611 Chronic respiratory failure with hypoxia: Secondary | ICD-10-CM

## 2024-02-13 DIAGNOSIS — J418 Mixed simple and mucopurulent chronic bronchitis: Secondary | ICD-10-CM

## 2024-02-15 DIAGNOSIS — J9610 Chronic respiratory failure, unspecified whether with hypoxia or hypercapnia: Secondary | ICD-10-CM | POA: Diagnosis not present

## 2024-02-19 ENCOUNTER — Other Ambulatory Visit: Payer: Self-pay | Admitting: Family Medicine

## 2024-02-27 DIAGNOSIS — L821 Other seborrheic keratosis: Secondary | ICD-10-CM | POA: Diagnosis not present

## 2024-02-27 DIAGNOSIS — D485 Neoplasm of uncertain behavior of skin: Secondary | ICD-10-CM | POA: Diagnosis not present

## 2024-02-27 DIAGNOSIS — L280 Lichen simplex chronicus: Secondary | ICD-10-CM | POA: Diagnosis not present

## 2024-02-27 DIAGNOSIS — D225 Melanocytic nevi of trunk: Secondary | ICD-10-CM | POA: Diagnosis not present

## 2024-02-27 NOTE — Progress Notes (Unsigned)
 Subjective:  Patient ID: Don [name], male    DOB: 1951/09/11  Age: 73 y.o. MRN: [id number]  Chief Complaint  Patient presents with   Choking   Discussed the use of AI scribe software for clinical note transcription with the patient, who gave verbal consent to proceed.  History of Present Illness  The patient, with a history of diabetes, COPD, heart disease, and back pain, presents for a three-month follow-up. The primary concern is a recent increase in difficulty swallowing. The patient reports episodes of choking and coughing after eating or drinking, which have become more frequent. This is a new development, as a previous swallowing study did not reveal any issues with liquid intake. The patient's caregiver notes that this issue is not constant but has been occurring more frequently in the past couple of months.  The patient's diabetes management is also a concern. Despite taking insulin  and other medications, the patient's blood sugar levels have been running high, sometimes off the meter. The patient checks his blood sugar multiple times a day, with readings ranging from 129 in the morning to 300 in the afternoon after taking steroids. The patient's diet includes bread, but he avoids sweets and pastas.  The patient also reports back pain, managed with oxycodone  as needed, usually twice a day. He has been experiencing panic attacks, which he describes as draining and difficult to recover from. The patient takes Xanax  twice a day to manage this. The patient's COPD is managed with multiple inhalers and nebulizers, and he reports wheezing.     HPI: Patient states for the past 3 months he has been having difficulty swallowing, at times he will eat or or drink something and it comes back up causing him to start choking and coughing.  Wants to know if xanax  can be increased or if something else can be added for anxiety, when he becomes shob his anxiety increases. Depression: on  duloxetine  60 mg twice daily. Xanax  0.25 mg twice daily.  Trazodone  150 mg before bed for insomnia.   Diabetes:  Complications: neuropathy Glucose checking: daily.  Glucose logs: 100-300s Hypoglycemia: no  Most recent A1C: 8.4. Current medications: Decreased metformin  to 1000 mg 1/2 oral twice daily. Toujeo  32 U daily. Humalog  4 U before breakfast and lunch. At night as needed for sugars greater than 300. Last Eye Exam: Macular degeneration. He was told they could not do anything so he did not need to come back.  Foot checks: daily. Couple of sores which are healing up.  Avoids sweets and pastas. Eats bread.   Hyperlipidemia: Current medications: rosuvastatin  40 mg once daily at night, fenofibrate  160 mg once daily. Otc fish oil  1000 mg 2 twice daily.   GERD: omeprazole  20 mg once daily. GERD is uncontrolled and having dysphagia.   Hypertension: Complications: Current medications: losartan  50 mg daily, spironolactone  25 mg daily, takes torsemide  20 mg daily and furosemide  20 mg daily. Should only be on one of these, but is confusing which one. He is not swelling.   CORONARY ARTERY DISEASE/PAD: scheduled with vascular end of April. On plavix  75 mg daily. BL stents in iliac arteries. Took ntg 2 weeks ago. It helped. Only took one.   COPD: On Brovana , yupelvri ,nd pulmicort . Dupixent . Prednisone  10 mg once daily. On ventolin  2 puffs four times a day as needed. Not even one a day.  Dr. Bertrum Brodie.   B12 deficiency: On b12 shots at home once monthly.   Vitamin D  once weekly.  BPH: on tamsulosin  04 mg 2 at night.   Chronic back pain: oxycodone  5/325 mg one twice daily as needed. Last took oxycodone  this morning. Flexeril  10 mg three times a day.  Has muscle cramps in his hands.   Hypothyroidism: on levothyroxine  25 mcg once daily.   Neuropathy: on gabapentin  400 mg three times a day. Workks well.      02/28/2024    9:44 AM 11/29/2023    9:38 AM 05/31/2023   10:38 AM 04/03/2023     4:09 PM 03/02/2023    7:45 AM  Depression screen PHQ 2/9  Decreased Interest 1 2 2  0 1  Down, Depressed, Hopeless 0 2 0 0 2  PHQ - 2 Score 1 4 2  0 3  Altered sleeping 0 2 2  3   Tired, decreased energy 1 2 0  3  Change in appetite 0 0 2  0  Feeling bad or failure about yourself  0 0 0  0  Trouble concentrating 0 1 1  0  Moving slowly or fidgety/restless 0 0 0  0  Suicidal thoughts 0 0 0  0  PHQ-9 Score 2 9 7  9   Difficult doing work/chores Not difficult at all Somewhat difficult Very difficult  Somewhat difficult        02/28/2024    9:44 AM  Fall Risk   Falls in the past year? 1  Number falls in past yr: 1  Injury with Fall? 1  Risk for fall due to : History of fall(s)  Follow up Falls evaluation completed    Patient Care Team: Mercy Stall, MD as PCP - General (Family Medicine) Hassan Links, MD as Referring Physician (Cardiology) Vernadine Golas, MD as Consulting Physician (Otolaryngology) Devon Fogo, [facility] (Inactive) as Pharmacist (Pharmacist)   Review of Systems  Constitutional:  Negative for chills, fatigue and fever.  Respiratory:  Negative for cough and shortness of breath.   Cardiovascular:  Negative for chest pain.  Gastrointestinal:  Negative for diarrhea, nausea and vomiting.  Neurological:  Negative for dizziness and headaches.    Current Outpatient Medications on File Prior to Visit  Medication Sig Dispense Refill   ALPRAZolam  (XANAX ) 0.25 MG tablet Take 1 tablet by mouth twice daily as needed for anxiety 60 tablet 0   arformoterol  (BROVANA ) 15 MCG/2ML NEBU Take 2 mLs (15 mcg total) by nebulization 2 (two) times daily. 120 mL 6   budesonide  (PULMICORT ) 0.25 MG/2ML nebulizer solution Take 2 mLs (0.25 mg total) by nebulization in the morning and at bedtime. 60 mL 11   clopidogrel  (PLAVIX ) 75 MG tablet Take 1 tablet by mouth once daily 90 tablet 0   cyanocobalamin  (VITAMIN B12) 1000 MCG/ML injection INJECT 1 ML (CC) INTRAMUSCULARLY ONCE A WEEK (Patient taking  differently: Inject 100 mcg into the muscle every 30 (thirty) days.) 10 mL 0   cyclobenzaprine  (FLEXERIL ) 10 MG tablet Take 1 tablet by mouth three times daily as needed for muscle spasm 90 tablet 0   DULoxetine  (CYMBALTA ) 60 MG capsule Take 1 capsule (60 mg total) by mouth 2 (two) times daily. 180 capsule 0   Dupilumab  (DUPIXENT ) 300 MG/2ML SOAJ Inject 300 mg into the skin every 14 (fourteen) days. 12 mL 1   fenofibrate  160 MG tablet Take 1 tablet by mouth once daily 90 tablet 0   finasteride  (PROSCAR ) 5 MG tablet Take 1 tablet by mouth once daily 90 tablet 0   furosemide  (LASIX ) 20 MG tablet Take 1 tablet by mouth  once daily 90 tablet 0   gabapentin  (NEURONTIN ) 400 MG capsule TAKE 1 CAPSULE BY MOUTH THREE TIMES DAILY 270 capsule 1   insulin  lispro (HUMALOG ) 100 UNIT/ML injection Inject 0.04 mLs (4 Units total) into the skin 3 (three) times daily with meals. 10 mL 11   Insulin  Pen Needle (PEN NEEDLES) 31G X 8 MM MISC Inject insulin  as directed 100 each 1   Insulin  Syringe-Needle U-100 31G X 5/16" 0.3 ML MISC Inject insulin  as directed 100 each 0   ipratropium-albuterol  (DUONEB) 0.5-2.5 (3) MG/3ML SOLN USE 1 AMPULE IN NEBULIZER 4 TIMES DAILY 360 mL 1   Lancets (ONETOUCH DELICA PLUS LANCET33G) MISC USE 1  TO CHECK GLUCOSE THREE TIMES DAILY (Patient taking differently: 1 each by Other route 3 (three) times daily.) 100 each 0   levothyroxine  (SYNTHROID ) 25 MCG tablet Take 1 tablet by mouth once daily 90 tablet 0   losartan  (COZAAR ) 50 MG tablet Take 1 tablet (50 mg total) by mouth daily. 90 tablet 2   MAG64 64 MG TBEC Take 1 tablet by mouth once daily 90 tablet 0   metoprolol  succinate (TOPROL -XL) 100 MG 24 hr tablet Take 1 tablet (100 mg total) by mouth daily. Take with or immediately following a meal. 90 tablet 2   nitroGLYCERIN  (NITROSTAT ) 0.4 MG SL tablet Place 1 tablet (0.4 mg total) under the tongue every 5 (five) minutes as needed for chest pain. DISSOLVE ONE TABLET UNDER THE TONGUE EVERY 5 TO  10 MINUTES PRIOR TO ACTIVITIES WHICH MIGHT PRECIPITATE AN ATTACK 25 tablet 0   nystatin cream (MYCOSTATIN) Apply 1 application topically 2 (two) times daily as needed for dry skin.     Omega-3 Fatty Acids (FISH OIL ) 1000 MG CAPS Take 2 capsules (2,000 mg total) by mouth in the morning and at bedtime. 180 capsule 12   ONETOUCH ULTRA test strip USE 1 STRIP TO CHECK GLUCOSE IN THE MORNING AND 1 AT BEDTIME 100 each 0   oxyCODONE -acetaminophen  (PERCOCET/ROXICET) 5-325 MG tablet Take 1 tablet by mouth every 6 (six) hours as needed for severe pain (pain score 7-10). 120 tablet 0   predniSONE  (DELTASONE ) 10 MG tablet Take 1 tablet by mouth once daily with breakfast 30 tablet 0   revefenacin  (YUPELRI ) 175 MCG/3ML nebulizer solution Take 3 mLs (175 mcg total) by nebulization daily. 90 mL 11   rosuvastatin  (CRESTOR ) 40 MG tablet Take 1 tablet by mouth once daily 90 tablet 0   spironolactone  (ALDACTONE ) 25 MG tablet Take 1 tablet (25 mg total) by mouth daily. 90 tablet 3   tamsulosin  (FLOMAX ) 0.4 MG CAPS capsule TAKE 2 CAPSULES BY MOUTH ONCE DAILY AFTER SUPPER 180 capsule 0   torsemide  (DEMADEX ) 20 MG tablet Take 1 tablet (20 mg total) by mouth as needed (edema). 30 tablet 0   TOUJEO  SOLOSTAR 300 UNIT/ML Solostar Pen INJECT 30 UNITS SUBCUTANEOUSLY ONCE DAILY 6 mL 0   traZODone  (DESYREL ) 150 MG tablet Take 1 tablet by mouth once daily 270 tablet 0   VENTOLIN  HFA 108 (90 Base) MCG/ACT inhaler INHALE 2 PUFFS BY MOUTH EVERY 6 HOURS AS NEEDED FOR SHORTNESS OF BREATH FOR WHEEZING (Patient taking differently: Inhale 2 puffs into the lungs every 6 (six) hours as needed for shortness of breath or wheezing.) 18 g 0   Vitamin D , Ergocalciferol , (DRISDOL ) 1.25 MG (50000 UNIT) CAPS capsule TAKE 1 CAPSULE BY MOUTH TWICE A WEEK (Patient taking differently: Take 50,000 Units by mouth every 7 (seven) days.) 12 capsule 3   No  current facility-administered medications on file prior to visit.   Past Medical History:  Diagnosis  Date   Absence of bladder continence 01/08/2022   Acute cough 07/07/2023   Acute infection of nasal sinus 01/13/2023   Anxiety state 02/02/2022   Arthritis    Asymptomatic LV dysfunction 10/18/2017   Atherosclerosis of aorta (HCC) 01/13/2023   Atypical chest pain 11/09/2022   B12 deficiency anemia 08/25/2021   Basal cell carcinoma    Benign prostatic hyperplasia with incomplete bladder emptying 01/14/2019   Bone pain 03/08/2020   Cancer (HCC)    throat - 1997, throat - 2018   Cardiomyopathy, secondary (HCC) 12/01/2016   Overview:  EF 47% 12/26/16   Chest pain syndrome 11/02/2017   Chronic coronary artery disease 10/18/2017   Chronic ischemic heart disease 11/13/1998   Jan 22, 2003 Entered By: Lonell Rives J Comment:  massive Mi in 2000 per patient hsitory, 2nd Mi in 2001Mar 11, 2004 Entered By: Lonell Rives J Comment: Had stent placedin 2000,cath/angio in 2001   Chronic neck pain 05/16/2022   Chronic pain syndrome 06/04/2022   Chronic respiratory failure with hypoxia (HCC) 05/16/2022   CKD (chronic kidney disease) stage 3, GFR 30-59 ml/min (HCC) 07/25/2019   COPD (chronic obstructive pulmonary disease) (HCC)    COPD exacerbation (HCC) 07/30/2023   Coronary artery disease involving native coronary artery of native heart with angina pectoris (HCC) 12/01/2016   Overview:  He has hx of IWMI in remote past, last cath in 2012 at [facility] showed chronic total occlusion of previously stented RCA, with good collaterals, a 40% LAD stenosis, inferior hypokinesis and EF 45%.    He's been lost to Cardiology f/u since 2015, but has not had any recurrent events   Deficiency anemia 08/25/2021   Diabetic polyneuropathy associated with type 2 diabetes mellitus (HCC) 05/16/2022   Dyslipidemia 12/01/2016   Encounter for Medicare annual wellness exam 01/13/2023   Erectile dysfunction due to diseases classified elsewhere 06/12/2019   GERD (gastroesophageal reflux disease)    Hallucination 10/08/2022    Hyperlipidemia    Hypertension    Hypertensive heart disease with heart failure (HCC)    Insomnia 01/28/2021   Iron  deficiency 07/07/2023   Iron  deficiency anemia due to chronic blood loss 08/25/2021   Lesion of vocal cord    Leukoplakia of larynx 11/15/2016   Long-term use of aspirin therapy 10/02/2018   Lumbar back pain 06/04/2022   Lung nodules 08/17/2022   Malfunction of penile prosthesis (HCC) 01/14/2019   Malignant neoplasm of skin 01/28/2021   Formatting of this note might be different from the original. Jan 22, 2003 Entered By: Lonell Rives J Comment: of skin - removed x2 inpast Jan 22, 2003 Entered By: Lonell Rives J Comment: of skin - removed x2 inpast   Melanoma of back (HCC)    melanoma on back   Memory loss 03/08/2020   Moderate recurrent major depression (HCC) 03/02/2023   Mucopurulent chronic bronchitis (HCC) 04/08/2023   Other ill-defined and unknown causes of morbidity and mortality 11/13/1993   Formatting of this note might be different from the original. Jan 31, 2008 Entered By: ARMOUR,ROSS B Comment: lumbar, 8/08 Jan 22, 2003 Entered By: Lonell Rives J Comment: x50yrs in work place  quit 3yrs ago in 2000 Jan 22, 2003 Entered By: Lonell Rives J Comment: x41yrs in work place  quit 31yrs ago in 2000 Jan 31, 2008 Entered By: Dane Dung B Comment: lumbar, 8/08   PAD (peripheral artery disease) (HCC) 10/18/2017   Peripheral vascular disease (HCC)  iliac artery clot   Peyronie's disease 06/12/2019   PVC (premature ventricular contraction) 10/09/2023   RBBB 10/09/2023   Squamous cell carcinoma of larynx (HCC) 12/13/2016   Throat cancer (HCC)    Tobacco use disorder 06/12/2019   Type 2 diabetes mellitus with hyperglycemia, with long-term current use of insulin  (HCC) 07/30/2023   URI, acute 05/31/2023   Urinary urgency 04/08/2023   Ventricular ectopy 11/09/2022   Vertigo 04/08/2023   Vitamin D  deficiency 01/13/2023   Past Surgical History:  Procedure Laterality  Date   ABDOMINAL ANGIOGRAM N/A 01/13/2015   Procedure: ABDOMINAL ANGIOGRAM;  Surgeon: Mayo Speck, MD;  Location[facility] CATH LAB;  Service: Cardiovascular;  Laterality: N/A;   APPENDECTOMY     BACK SURGERY     CARDIAC CATHETERIZATION  2000/2012   with stents in 2000   CHOLECYSTECTOMY     COLONOSCOPY     ELBOW SURGERY     bilateral   ESOPHAGOGASTRODUODENOSCOPY     KNEE SURGERY Left    MICROLARYNGOSCOPY WITH CO2 LASER AND EXCISION OF VOCAL CORD LESION N/A 11/23/2016   Procedure: MICROLARYNGOSCOPY  AND EXCISION OF VOCAL CORD LESION;  Surgeon: Vernadine Golas, MD;  Location: [facility] OR;  Service: ENT;  Laterality: N/A;   MICROLARYNGOSCOPY WITH CO2 LASER AND EXCISION OF VOCAL CORD LESION N/A 01/11/2017   Procedure: MICROLARYNGOSCOPY WITH CO2 LASER AND EXCISION OF VOCAL CORD LESION;  Surgeon: Vernadine Golas, MD;  Location: [facility] OR;  Service: ENT;  Laterality: N/A;   MICROLARYNGOSCOPY WITH LASER N/A 03/16/2015   Procedure: MICROLARYNGOSCOPY ;  Surgeon: Vernadine Golas, MD;  Location: [facility] OR;  Service: ENT;  Laterality: N/A;   peyronie's surgery     SHOULDER SURGERY Right    rotator cuff   SPINE SURGERY  09/2020   cervical surgery. steel plate, bone spurs, allograft.   THROAT SURGERY  1997   cancer removed    Family History  Problem Relation Age of Onset   Cancer Mother        bone   Hypertension Mother    Stroke Father    Hypertension Father    Healthy Child    Cancer Other        brain   Diabetes Other    Social History   Socioeconomic History   Marital status: Married    Spouse name: Not on file   Number of children: 4   Years of education: Not on file   Highest education level: GED or equivalent  Occupational History   Occupation: retired  Tobacco Use   Smoking status: Every Day    Current packs/day: 1.00    Average packs/day: 1 pack/day for 50.0 years (50.0 ttl pk-yrs)    Types: Cigarettes   Smokeless tobacco: Never   Tobacco comments:    down to .5 ppd  11/02/2022 hfb  Vaping Use   Vaping  status: Former  Substance and Sexual Activity   Alcohol use: Not on file   Drug use: No   Sexual activity: Not on file  Other Topics Concern   Not on file  Social History Narrative   Lives with wife       One level      Right hand   Social Drivers of Health   Financial Resource Strain: Low Risk  (11/12/2023)   Overall Financial Resource Strain (CARDIA)    Difficulty of Paying Living Expenses: Not hard at all  Food Insecurity: No Food Insecurity (11/12/2023)   Hunger Vital Sign    Worried  About Running Out of Food in the Last Year: Never true    Ran Out of Food in the Last Year: Never true  Transportation Needs: No Transportation Needs (11/12/2023)   PRAPARE - Administrator, Civil Service (Medical): No    Lack of Transportation (Non-Medical): No  Physical Activity: Unknown (11/12/2023)   Exercise Vital Sign    Days of Exercise per Week: 0 days    Minutes of Exercise per Session: Not on file  Recent Concern: Physical Activity - Inactive (11/12/2023)   Exercise Vital Sign    Days of Exercise per Week: 0 days    Minutes of Exercise per Session: 30 min  Stress: No Stress Concern Present (11/12/2023)   [employer] of Occupational Health - Occupational Stress Questionnaire    Feeling of Stress : Only a little  Social Connections: Socially Isolated (11/12/2023)   Social Connection and Isolation Panel [NHANES]    Frequency of Communication with Friends and Family: Once a week    Frequency of Social Gatherings with Friends and Family: Never    Attends Religious Services: Never    Diplomatic Services operational officer: No    Attends Engineer, structural: Not on file    Marital Status: Married    Objective:  BP 130/64   Pulse 78   Temp 98.1 F (36.7 C)   Ht 5\' 10"  (1.778 m)   Wt 201 lb (91.2 kg)   SpO2 98%   BMI 28.84 kg/m      02/28/2024    8:38 AM 01/25/2024   10:15 AM 01/08/2024    2:50 PM  BP/Weight  Systolic BP 130 127 157   Diastolic BP 64 60 80  Wt. (Lbs) 201  198  BMI 28.84 kg/m2  28.41 kg/m2    Physical Exam  Diabetic Foot Exam - Simple   No data filed      Lab Results  Component Value Date   WBC 16.1 (H) 02/28/2024   HGB 14.9 02/28/2024   HCT 46.1 02/28/2024   PLT 195 02/28/2024   GLUCOSE 467 (H) 02/28/2024   CHOL 120 02/28/2024   TRIG 517 (H) 02/28/2024   HDL 27 (L) 02/28/2024   LDLCALC 23 02/28/2024   ALT 21 02/28/2024   AST 22 02/28/2024   NA 134 02/28/2024   K 4.6 02/28/2024   CL 93 (L) 02/28/2024   CREATININE 1.10 02/28/2024   BUN 19 02/28/2024   CO2 26 02/28/2024   TSH 2.280 02/28/2024   INR 1.00 11/14/2023   HGBA1C 11.8 (H) 02/28/2024   MICROALBUR 30 05/23/2021      Assessment & Plan:   Assessment and Plan   Dysphagia Intermittent dysphagia with liquids and solids, leading to choking and coughing. Recent onset of liquid dysphagia. 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 No significant signs of aspiration observed. - Schedule a swallowing study at [facility].  Diabetes Mellitus Blood glucose levels are poorly controlled, with readings ranging from 129 mg/dL in the morning to over 300 mg/dL in the afternoon, exacerbated by prednisone  use. Current regimen includes metformin , Toujeo , and Humalog . Metformin  dose reduced due to gastrointestinal side effects. Humalog  is being used twice daily, with additional doses as needed for high evening sugars. - Continue metformin  500 mg twice daily. - Continue Toujeo  32 units daily. - Adjust Humalog  to 4 units before each meal and as needed at night for blood glucose >300 mg/dL. -  Encourage dietary modifications, including switching to whole grain wheat bread and reducing fried foods.  Congestive Heart Failure Currently on both furosemide  and torsemide , leading to frequent urination without signs of peripheral edema. Previous ED visit suggested CHF exacerbation, but no  current swelling observed. - Discontinue furosemide . - Continue torsemide  20 mg daily.  Coronary Artery Disease Heart attack and stents in iliac arteries. Occasional chest pain managed with nitroglycerin , which provides relief. - Perform EKG to assess current cardiac status.  Chronic Obstructive Pulmonary Disease (COPD) Managed with nebulizers and Dupixent . Wheezing noted on examination. Experiences panic attacks related to breathing difficulties. - Perform EKG. - Administer albuterol  nebulizer treatment. - Continue current nebulizer regimen as per pulmonology.  Macular Degeneration Severe vision impairment with visual acuity of 44/400 in one eye and 64/400 in the other. Previous treatments included injections for blood clots behind the eyes, but no further interventions are planned as per ophthalmology advice. - Encourage annual diabetic eye exams.  General Health Maintenance Overdue for routine diabetic eye exam. Regular monitoring of blood glucose and foot care is emphasized. Dietary modifications are recommended to improve diabetes management. Checks feet daily for sores or injuries, and dietary changes include reducing bread intake and avoiding sweets and pastas. - Encourage annual diabetic eye exams. - Monitor blood glucose levels regularly. - Check feet daily for sores or injuries. - Encourage dietary changes, including reducing bread intake and avoiding sweets and pastas.  Follow-up Regular follow-up is necessary for ongoing management of multiple chronic conditions. Coordination with cardiology and pulmonology is essential for comprehensive care. - Schedule follow-up appointment in 3 months for diabetes management. - Coordinate with cardiology and pulmonology for ongoing care.       Hypertensive heart disease with heart failure [facility]) Assessment & Plan: Currently on both furosemide  and torsemide , leading to frequent urination without signs of peripheral edema. Previous ED  visit suggested CHF exacerbation, but no current swelling observed. - Discontinue furosemide . - Continue torsemide  20 mg daily.  Orders: -     CBC with Differential/Platelet -     Comprehensive metabolic panel with GFR  Coronary artery disease involving native coronary artery of native heart with angina pectoris [facility]) Assessment & Plan: Heart attack and stents in iliac arteries. Occasional chest pain managed with nitroglycerin , which provides relief. - Perform EKG to assess current cardiac status.  Orders: -     EKG 12-Lead  Chronic respiratory failure with hypoxia (HCC) Assessment & Plan: Managed with nebulizers and Dupixent . Wheezing noted on examination. Experiences panic attacks related to breathing difficulties. - Perform EKG. - Administer albuterol  nebulizer treatment. - Continue current nebulizer regimen as per pulmonology.   Diabetic polyneuropathy associated with type 2 diabetes mellitus (HCC) Assessment & Plan: Blood glucose levels are poorly controlled, with readings ranging from 129 mg/dL in the morning to over 300 mg/dL in the afternoon, exacerbated by prednisone  use. Current regimen includes metformin , Toujeo , and Humalog . Metformin  dose reduced due to gastrointestinal side effects. Humalog  is being used twice daily, with additional doses as needed for high evening sugars. - Continue metformin  500 mg twice daily. - Continue Toujeo  32 units daily. - Adjust Humalog  to 4 units before each meal and as needed at night for blood glucose >300 mg/dL. - Encourage dietary modifications, including switching to whole grain wheat bread and reducing fried foods.  Orders: -     Hemoglobin A1c -     Microalbumin / creatinine urine ratio  Dyslipidemia Assessment & Plan: Well controlled.  No changes to medicines.  rosuvastatin  40 mg once daily at night, fenofibrate  160 mg once daily. Otc fish oil  1000 mg 2 twice daily.  Continue to work on eating a healthy diet and exercise.  Labs  drawn today.    Orders: -     Lipid panel  Oropharyngeal dysphagia Assessment & Plan: Intermittent dysphagia with liquids and solids, leading to choking and coughing. Recent onset of liquid dysphagia. 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 No significant signs of aspiration observed. - Schedule a swallowing study at [facility].  Orders: -     SLP modified barium swallow; Future  Acquired hypothyroidism Assessment & Plan: Previously well controlled Continue Synthroid  at current dose  Recheck TSH and adjust Synthroid  as indicated    Orders: -     TSH  Vitamin D  deficiency disease Assessment & Plan: Check labs  Orders: -     VITAMIN D  25 Hydroxy (Vit-D Deficiency, Fractures)  Mixed simple and mucopurulent chronic bronchitis (HCC) Assessment & Plan: Managed with nebulizers and Dupixent . Wheezing noted on examination. Experiences panic attacks related to breathing difficulties. - Perform EKG. - Administer albuterol  nebulizer treatment. - Continue current nebulizer regimen as per pulmonology.  Orders: -     Ipratropium-Albuterol   Macular degeneration of both eyes, unspecified type Assessment & Plan: Severe vision impairment with visual acuity of 44/400 in one eye and 64/400 in the other. Previous treatments included injections for blood clots behind the eyes, but no further interventions are planned as per ophthalmology advice. - Encourage annual diabetic eye exams.   Other orders -     metFORMIN  HCl; Take 2 tablets (1,000 mg total) by mouth 2 (two) times daily with a meal. -     busPIRone  HCl; Take 1 tablet (7.5 mg total) by mouth 2 (two) times daily.  Dispense: 60 tablet; Refill: 2 -     Pantoprazole  Sodium; Take 1 tablet (40 mg total) by mouth daily.  Dispense: 90 tablet; Refill: 0 -     EKG     Meds ordered this encounter  Medications   metFORMIN  (GLUCOPHAGE ) 500 MG tablet    Sig: Take 2 tablets (1,000  mg total) by mouth 2 (two) times daily with a meal.   busPIRone  (BUSPAR ) 7.5 MG tablet    Sig: Take 1 tablet (7.5 mg total) by mouth 2 (two) times daily.    Dispense:  60 tablet    Refill:  2   ipratropium-albuterol  (DUONEB) 0.5-2.5 (3) MG/3ML nebulizer solution 3 mL   pantoprazole  (PROTONIX ) 40 MG tablet    Sig: Take 1 tablet (40 mg total) by mouth daily.    Dispense:  90 tablet    Refill:  0    Orders Placed This Encounter  Procedures   CBC with Differential/Platelet   Comprehensive metabolic panel with GFR   Hemoglobin A1c   Lipid panel   Microalbumin / creatinine urine ratio   TSH   VITAMIN D  25 Hydroxy (Vit-D Deficiency, Fractures)   SLP modified barium swallow   EKG 12-Lead   EKG     Follow-up: Return in about 6 weeks (around 04/10/2024) for chronic follow up.  Total time spent on today's visit was 60+ minutes, including both face-to-face time and nonface-to-face time personally spent on review of chart (labs and imaging), discussing labs and goals, discussing further work-up, treatment options, referrals to specialist if needed, reviewing outside records of pertinent, answering patient's questions, and coordinating care.  I,Marla I Leal-Borjas,acting  as a scribe for Mercy Stall, MD.,have documented all relevant documentation on the behalf of Mercy Stall, MD,as directed by  Mercy Stall, MD while in the presence of Mercy Stall, MD.   An After Visit Summary was printed and given to the patient.  Mercy Stall, MD [facility] [phone]

## 2024-02-28 ENCOUNTER — Ambulatory Visit (INDEPENDENT_AMBULATORY_CARE_PROVIDER_SITE_OTHER): Admitting: Family Medicine

## 2024-02-28 ENCOUNTER — Other Ambulatory Visit: Payer: Self-pay | Admitting: *Deleted

## 2024-02-28 ENCOUNTER — Ambulatory Visit

## 2024-02-28 ENCOUNTER — Encounter: Payer: Self-pay | Admitting: Family Medicine

## 2024-02-28 VITALS — BP 130/64 | HR 78 | Temp 98.1°F | Ht 70.0 in | Wt 201.0 lb

## 2024-02-28 DIAGNOSIS — K219 Gastro-esophageal reflux disease without esophagitis: Secondary | ICD-10-CM

## 2024-02-28 DIAGNOSIS — I25119 Atherosclerotic heart disease of native coronary artery with unspecified angina pectoris: Secondary | ICD-10-CM | POA: Diagnosis not present

## 2024-02-28 DIAGNOSIS — R1312 Dysphagia, oropharyngeal phase: Secondary | ICD-10-CM

## 2024-02-28 DIAGNOSIS — I739 Peripheral vascular disease, unspecified: Secondary | ICD-10-CM

## 2024-02-28 DIAGNOSIS — E785 Hyperlipidemia, unspecified: Secondary | ICD-10-CM | POA: Diagnosis not present

## 2024-02-28 DIAGNOSIS — E039 Hypothyroidism, unspecified: Secondary | ICD-10-CM | POA: Diagnosis not present

## 2024-02-28 DIAGNOSIS — E559 Vitamin D deficiency, unspecified: Secondary | ICD-10-CM

## 2024-02-28 DIAGNOSIS — J418 Mixed simple and mucopurulent chronic bronchitis: Secondary | ICD-10-CM

## 2024-02-28 DIAGNOSIS — F17200 Nicotine dependence, unspecified, uncomplicated: Secondary | ICD-10-CM

## 2024-02-28 DIAGNOSIS — I509 Heart failure, unspecified: Secondary | ICD-10-CM

## 2024-02-28 DIAGNOSIS — H3530 Unspecified macular degeneration: Secondary | ICD-10-CM

## 2024-02-28 DIAGNOSIS — J9611 Chronic respiratory failure with hypoxia: Secondary | ICD-10-CM

## 2024-02-28 DIAGNOSIS — E1142 Type 2 diabetes mellitus with diabetic polyneuropathy: Secondary | ICD-10-CM | POA: Diagnosis not present

## 2024-02-28 DIAGNOSIS — I110 Hypertensive heart disease with heart failure: Secondary | ICD-10-CM

## 2024-02-28 DIAGNOSIS — F411 Generalized anxiety disorder: Secondary | ICD-10-CM

## 2024-02-28 MED ORDER — BUSPIRONE HCL 7.5 MG PO TABS: 7.5000 mg | ORAL_TABLET | Freq: Two times a day (BID) | ORAL | 2 refills | Status: DC

## 2024-02-28 MED ORDER — IPRATROPIUM-ALBUTEROL 0.5-2.5 (3) MG/3ML IN SOLN: 3.0000 mL | Freq: Once | RESPIRATORY_TRACT | Status: DC

## 2024-02-28 MED ORDER — PANTOPRAZOLE SODIUM 40 MG PO TBEC: 40.0000 mg | DELAYED_RELEASE_TABLET | Freq: Every day | ORAL | 0 refills | Status: DC

## 2024-02-28 MED ORDER — METFORMIN HCL 500 MG PO TABS: 1000.0000 mg | ORAL_TABLET | Freq: Two times a day (BID) | ORAL | Status: DC

## 2024-02-28 NOTE — Patient Instructions (Addendum)
 Dysphagia: Omeprazole to pantoprazole 40 mg daily.  Ordering the following study at [facility].  Anxiety: Started on buspirone 7.5 mg twice daily.  Continue Xanax, Cymbalta, and trazodone.  Swelling: Stop Lasix.  Continue torsemide 20 mg daily in AM.  Diabetes: Anticipate very poor control.  Recommend increase Toujeo by 2 units every 3 days until sugars are less than 140 consistently.  If sugars begin to drop less than 80 stop increasing Toujeo.  Take Humalog 4 units before each meal.  Will consider increasing this in the future.  Continue metformin 500 mg twice daily.

## 2024-03-02 DIAGNOSIS — H3530 Unspecified macular degeneration: Secondary | ICD-10-CM | POA: Insufficient documentation

## 2024-03-02 DIAGNOSIS — E039 Hypothyroidism, unspecified: Secondary | ICD-10-CM

## 2024-03-02 DIAGNOSIS — J418 Mixed simple and mucopurulent chronic bronchitis: Secondary | ICD-10-CM

## 2024-03-02 HISTORY — DX: Mixed simple and mucopurulent chronic bronchitis: J41.8

## 2024-03-02 HISTORY — DX: Hypothyroidism, unspecified: E03.9

## 2024-03-02 HISTORY — DX: Unspecified macular degeneration: H35.30

## 2024-03-02 LAB — COMPREHENSIVE METABOLIC PANEL WITH GFR
ALT: 21 IU/L (ref 0–44)
AST: 22 IU/L (ref 0–40)
Albumin: 3.8 g/dL (ref 3.8–4.8)
Alkaline Phosphatase: 76 IU/L (ref 44–121)
BUN/Creatinine Ratio: 17 (ref 10–24)
BUN: 19 mg/dL (ref 8–27)
Bilirubin Total: 0.5 mg/dL (ref 0.0–1.2)
CO2: 26 mmol/L (ref 20–29)
Calcium: 8.8 mg/dL (ref 8.6–10.2)
Chloride: 93 mmol/L — ABNORMAL LOW (ref 96–106)
Creatinine, Ser: 1.1 mg/dL (ref 0.76–1.27)
Globulin, Total: 2.4 g/dL (ref 1.5–4.5)
Glucose: 467 mg/dL — ABNORMAL HIGH (ref 70–99)
Potassium: 4.6 mmol/L (ref 3.5–5.2)
Sodium: 134 mmol/L (ref 134–144)
Total Protein: 6.2 g/dL (ref 6.0–8.5)
eGFR: 70 mL/min/{1.73_m2} (ref >59)

## 2024-03-02 LAB — CBC WITH DIFFERENTIAL/PLATELET
Basophils Absolute: 0.1 10*3/uL (ref 0.0–0.2)
Basos: 1 %
EOS (ABSOLUTE): 0.1 10*3/uL (ref 0.0–0.4)
Eos: 1 %
Hematocrit: 46.1 % (ref 37.5–51.0)
Hemoglobin: 14.9 g/dL (ref 13.0–17.7)
Immature Grans (Abs): 0.1 10*3/uL (ref 0.0–0.1)
Immature Granulocytes: 1 %
Lymphocytes Absolute: 1.9 10*3/uL (ref 0.7–3.1)
Lymphs: 12 %
MCH: 30.8 pg (ref 26.6–33.0)
MCHC: 32.3 g/dL (ref 31.5–35.7)
MCV: 95 fL (ref 79–97)
Monocytes Absolute: 0.8 10*3/uL (ref 0.1–0.9)
Monocytes: 5 %
Neutrophils Absolute: 13.1 10*3/uL — ABNORMAL HIGH (ref 1.4–7.0)
Neutrophils: 80 %
Platelets: 195 10*3/uL (ref 150–450)
RBC: 4.84 x10E6/uL (ref 4.14–5.80)
RDW: 14.8 % (ref 11.6–15.4)
WBC: 16.1 10*3/uL — ABNORMAL HIGH (ref 3.4–10.8)

## 2024-03-02 LAB — LIPID PANEL
Chol/HDL Ratio: 4.4 ratio (ref 0.0–5.0)
Cholesterol, Total: 120 mg/dL (ref 100–199)
HDL: 27 mg/dL — ABNORMAL LOW (ref >39)
LDL Chol Calc (NIH): 23 mg/dL (ref 0–99)
Triglycerides: 517 mg/dL — ABNORMAL HIGH (ref 0–149)
VLDL Cholesterol Cal: 70 mg/dL — ABNORMAL HIGH (ref 5–40)

## 2024-03-02 LAB — VITAMIN D 25 HYDROXY (VIT D DEFICIENCY, FRACTURES): Vit D, 25-Hydroxy: 14.8 ng/mL — ABNORMAL LOW (ref 30.0–100.0)

## 2024-03-02 LAB — TSH: TSH: 2.28 u[IU]/mL (ref 0.450–4.500)

## 2024-03-02 LAB — MICROALBUMIN / CREATININE URINE RATIO

## 2024-03-02 LAB — HEMOGLOBIN A1C
Est. average glucose Bld gHb Est-mCnc: 292 mg/dL
Hgb A1c MFr Bld: 11.8 % — ABNORMAL HIGH (ref 4.8–5.6)

## 2024-03-02 NOTE — Assessment & Plan Note (Signed)
 Intermittent dysphagia with liquids and solids, leading to choking and coughing. Recent onset of liquid dysphagia. 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 No significant signs of aspiration observed. - Schedule a swallowing study at [facility]. - change omeprazole  to protonix  40 mg daily.

## 2024-03-02 NOTE — Assessment & Plan Note (Addendum)
 Currently on both furosemide  and torsemide , leading to frequent urination without signs of peripheral edema. Previous ED visit suggested CHF exacerbation, but no current swelling observed. - Discontinue furosemide . - Continue torsemide  20 mg daily.

## 2024-03-02 NOTE — Assessment & Plan Note (Signed)
 Managed with nebulizers and Dupixent . Wheezing noted on examination. Experiences panic attacks related to breathing difficulties. - Perform EKG. - Administer albuterol  nebulizer treatment. - Continue current nebulizer regimen as per pulmonology.

## 2024-03-02 NOTE — Assessment & Plan Note (Addendum)
 Poorly controlled. - Continue metformin  500 mg twice daily. - Recommend increase toujeo  to 40 U before bed. Recommend increase Toujeo  by 2 units every 3 days until sugars are less than 140 consistently.  If sugars begin to drop less than 80 stop increasing Toujeo  - Adjust Humalog  to 4 units before each meal. - Encourage dietary modifications, including switching to whole grain wheat bread and reducing fried foods. Continue to monitor feet BL.

## 2024-03-02 NOTE — Assessment & Plan Note (Signed)
 Previously well controlled Continue Synthroid at current dose  Recheck TSH and adjust Synthroid as indicated

## 2024-03-02 NOTE — Assessment & Plan Note (Signed)
 Check labs

## 2024-03-02 NOTE — Assessment & Plan Note (Addendum)
 Well controlled.  No changes to medicines. rosuvastatin  40 mg once daily at night, fenofibrate  160 mg once daily. Otc fish oil  1000 mg 2 twice daily.  Continue to work on eating a healthy diet and exercise.  Labs drawn today.

## 2024-03-02 NOTE — Assessment & Plan Note (Addendum)
 Heart attack and stents in iliac arteries. Occasional chest pain managed with nitroglycerin , which provides relief. - Perform EKG to assess current cardiac status.

## 2024-03-02 NOTE — Assessment & Plan Note (Addendum)
 Managed with nebulizers and Dupixent . Wheezing noted on examination. Experiences panic attacks related to breathing difficulties. - Perform EKG. - Administer albuterol  nebulizer treatment. - Continue current nebulizer regimen as per pulmonology.

## 2024-03-02 NOTE — Assessment & Plan Note (Signed)
 Severe vision impairment with visual acuity of 44/400 in one eye and 64/400 in the other. Previous treatments included injections for blood clots behind the eyes, but no further interventions are planned as per ophthalmology advice. - Encourage annual diabetic eye exams.

## 2024-03-02 NOTE — Assessment & Plan Note (Deleted)
 Omeprazole  to pantoprazole  40 mg daily.  Ordering the following study at [facility].

## 2024-03-03 ENCOUNTER — Other Ambulatory Visit: Payer: Self-pay | Admitting: Cardiology

## 2024-03-03 ENCOUNTER — Encounter: Payer: Self-pay | Admitting: Family Medicine

## 2024-03-03 ENCOUNTER — Other Ambulatory Visit: Payer: Self-pay | Admitting: Family Medicine

## 2024-03-03 NOTE — Assessment & Plan Note (Signed)
 Recommend cessation

## 2024-03-03 NOTE — Assessment & Plan Note (Signed)
 Continue duloxetine  and trazodone .   Start on buspirone  7.5 mg twice daily.  Continue xanax  0.25 mg twice daily.

## 2024-03-03 NOTE — Assessment & Plan Note (Signed)
 Change Omeprazole  to pantoprazole  40 mg daily.

## 2024-03-04 ENCOUNTER — Other Ambulatory Visit: Payer: Self-pay

## 2024-03-04 MED ORDER — ROSUVASTATIN CALCIUM 20 MG PO TABS: 20.0000 mg | ORAL_TABLET | Freq: Every day | ORAL | 3 refills | Status: DC

## 2024-03-04 MED ORDER — VITAMIN D (ERGOCALCIFEROL) 1.25 MG (50000 UNIT) PO CAPS: ORAL_CAPSULE | ORAL | 3 refills | Status: DC

## 2024-03-04 MED ORDER — ICOSAPENT ETHYL 1 G PO CAPS
2.0000 g | ORAL_CAPSULE | Freq: Two times a day (BID) | ORAL | 2 refills | Status: DC
Start: 2024-03-04 — End: 2024-03-10

## 2024-03-04 MED ORDER — TOUJEO SOLOSTAR 300 UNIT/ML ~~LOC~~ SOPN: 40.0000 [IU] | PEN_INJECTOR | Freq: Every day | SUBCUTANEOUS | 0 refills | Status: DC

## 2024-03-06 ENCOUNTER — Other Ambulatory Visit: Payer: Self-pay | Admitting: Physician Assistant

## 2024-03-06 ENCOUNTER — Other Ambulatory Visit: Payer: Self-pay

## 2024-03-06 ENCOUNTER — Encounter: Payer: Self-pay | Admitting: Surgery

## 2024-03-06 ENCOUNTER — Other Ambulatory Visit: Payer: Self-pay | Admitting: Family Medicine

## 2024-03-06 DIAGNOSIS — J9611 Chronic respiratory failure with hypoxia: Secondary | ICD-10-CM

## 2024-03-06 DIAGNOSIS — J418 Mixed simple and mucopurulent chronic bronchitis: Secondary | ICD-10-CM

## 2024-03-06 DIAGNOSIS — E782 Mixed hyperlipidemia: Secondary | ICD-10-CM

## 2024-03-06 DIAGNOSIS — E1142 Type 2 diabetes mellitus with diabetic polyneuropathy: Secondary | ICD-10-CM

## 2024-03-07 ENCOUNTER — Other Ambulatory Visit: Payer: Self-pay

## 2024-03-07 DIAGNOSIS — E1142 Type 2 diabetes mellitus with diabetic polyneuropathy: Secondary | ICD-10-CM

## 2024-03-07 MED ORDER — OXYCODONE-ACETAMINOPHEN 5-325 MG PO TABS: 1.0000 | ORAL_TABLET | Freq: Two times a day (BID) | ORAL | 0 refills | Status: DC | PRN

## 2024-03-07 MED ORDER — INSULIN LISPRO 100 UNIT/ML IJ SOLN: 4.0000 [IU] | Freq: Three times a day (TID) | INTRAMUSCULAR | 11 refills | Status: DC

## 2024-03-10 ENCOUNTER — Other Ambulatory Visit: Payer: Self-pay | Admitting: Family Medicine

## 2024-03-10 ENCOUNTER — Ambulatory Visit (HOSPITAL_COMMUNITY): Admission: RE | Admit: 2024-03-10 | Source: Ambulatory Visit

## 2024-03-10 ENCOUNTER — Ambulatory Visit: Attending: Surgery | Admitting: Surgery

## 2024-03-10 MED ORDER — OMEGA-3-ACID ETHYL ESTERS 1 G PO CAPS: 2.0000 g | ORAL_CAPSULE | Freq: Two times a day (BID) | ORAL | 0 refills | Status: DC

## 2024-03-16 DIAGNOSIS — J449 Chronic obstructive pulmonary disease, unspecified: Secondary | ICD-10-CM | POA: Diagnosis not present

## 2024-03-16 DIAGNOSIS — J9610 Chronic respiratory failure, unspecified whether with hypoxia or hypercapnia: Secondary | ICD-10-CM | POA: Diagnosis not present

## 2024-03-18 ENCOUNTER — Encounter: Payer: Self-pay | Admitting: Family Medicine

## 2024-03-18 ENCOUNTER — Telehealth (INDEPENDENT_AMBULATORY_CARE_PROVIDER_SITE_OTHER): Admitting: Family Medicine

## 2024-03-18 VITALS — BP 127/60 | HR 70 | Temp 98.9°F

## 2024-03-18 DIAGNOSIS — J0100 Acute maxillary sinusitis, unspecified: Secondary | ICD-10-CM

## 2024-03-18 DIAGNOSIS — E1142 Type 2 diabetes mellitus with diabetic polyneuropathy: Secondary | ICD-10-CM | POA: Diagnosis not present

## 2024-03-18 DIAGNOSIS — J441 Chronic obstructive pulmonary disease with (acute) exacerbation: Secondary | ICD-10-CM

## 2024-03-18 DIAGNOSIS — J9611 Chronic respiratory failure with hypoxia: Secondary | ICD-10-CM | POA: Diagnosis not present

## 2024-03-18 MED ORDER — DOXYCYCLINE HYCLATE 100 MG PO TABS: 100.0000 mg | ORAL_TABLET | Freq: Two times a day (BID) | ORAL | 0 refills | Status: DC

## 2024-03-18 MED ORDER — PREDNISONE 20 MG PO TABS
ORAL_TABLET | ORAL | 0 refills | Status: AC
Start: 2024-03-18 — End: 2024-03-27

## 2024-03-18 NOTE — Patient Instructions (Signed)
 Start on doxycycline twice daily x 10 days.  Prednisone  taper sent.   Diabetes improved, but anticipate worsening when starts prednisone  taper.  Increase toujeo  to 50 U daily. Continue novolog  4 U before meals. Hold on increasing by 2 U every 3 days until back to prednisone  5-10 mg daily scheduled  Watch for low sugars.

## 2024-03-18 NOTE — Assessment & Plan Note (Signed)
 Improved, but anticipate worsening when starts prednisone  taper.  Increase toujeo  to 50 U daily. Continue novolog  4 U before meals. Hold on increasing by 2 U every 3 days until back to prednisone  5-10 mg daily scheduled  Watch for low sugars.

## 2024-03-18 NOTE — Assessment & Plan Note (Signed)
 Doxycycline and prednisone  taper sent.

## 2024-03-18 NOTE — Assessment & Plan Note (Signed)
 Doxycycline prescription sent

## 2024-03-18 NOTE — Assessment & Plan Note (Signed)
 Use oxygen  to maintain pulse ox > 90%.

## 2024-03-18 NOTE — Progress Notes (Signed)
 Virtual Visit via Video Note   This visit type was conducted due to national recommendations for restrictions regarding the COVID-19 Pandemic (e.g. social distancing) in an effort to limit this patient's exposure and mitigate transmission in our community.  Due to his co-morbid illnesses, this patient is at least at moderate risk for complications without adequate follow up.  This format is felt to be most appropriate for this patient at this time.  All issues noted in this document were discussed and addressed.  A limited physical exam was performed with this format.  A verbal consent was obtained for the virtual visit.   Patient Location:home Provider Location:office Evaluation Performed:  acute  Subjective:    Patient ID: Don [name], male    DOB: 1951/07/14, 73 y.o.   MRN: [id number]  Chief Complaint  Patient presents with   COPD    HPI Patient is complaining of increased sputum production, fatigue, shortness of breath, and coughing. His oxygen  dropped to 72 when he was walking. Put his oxygen  backn on at 3 L yesterday and it came back up into the upper 90s. He was fine all last week and over the weekend. He mowed last week. Now oxygen  is 90% on room air. Starting using duonebs four times a day. I dropped his daily prednisone  to 5 mg daily. He usually does the levaquin , but this last time it did not seem to work and he needed a second dose of levaquin .  He has received doxycycline twice in the last 6 months and did well without side effects.   Diabetes: Highest sugars have been are 300, but more like 120-150s. Patient is on toujeo  40 U and has been increasing and decreasing based on what his sugars were doing instead of gradually increasing by 2 U every 3 days as directed.  They misunderstood. He has been using novolog  4 u before each meal  Review of Systems  Constitutional:  Positive for malaise/fatigue. Negative for chills and fever.  HENT:  Positive for congestion. Negative  for ear pain and sore throat.   Respiratory:  Positive for cough, sputum production, shortness of breath and wheezing.   Cardiovascular:  Negative for chest pain.    Past Medical History:  Diagnosis Date   Absence of bladder continence 01/08/2022   Acute cough 07/07/2023   Acute infection of nasal sinus 01/13/2023   Anxiety state 02/02/2022   Arthritis    Asymptomatic LV dysfunction 10/18/2017   Atherosclerosis of aorta (HCC) 01/13/2023   Atypical chest pain 11/09/2022   B12 deficiency anemia 08/25/2021   Basal cell carcinoma    Benign prostatic hyperplasia with incomplete bladder emptying 01/14/2019   Bone pain 03/08/2020   Cancer (HCC)    throat - 1997, throat - 2018   Cardiomyopathy, secondary (HCC) 12/01/2016   Overview:  EF 47% 12/26/16   Chest pain syndrome 11/02/2017   Chronic coronary artery disease 10/18/2017   Chronic ischemic heart disease 11/13/1998   Jan 22, 2003 Entered By: Lonell Rives J Comment:  massive Mi in 2000 per patient hsitory, 2nd Mi in 2001Mar 11, 2004 Entered By: Lonell Rives J Comment: Had stent placedin 2000,cath/angio in 2001   Chronic neck pain 05/16/2022   Chronic pain syndrome 06/04/2022   Chronic respiratory failure with hypoxia (HCC) 05/16/2022   CKD (chronic kidney disease) stage 3, GFR 30-59 ml/min (HCC) 07/25/2019   COPD (chronic obstructive pulmonary disease) (HCC)    COPD exacerbation (HCC) 07/30/2023   Coronary artery disease involving native coronary artery  of native heart with angina pectoris (HCC) 12/01/2016   Overview:  He has hx of IWMI in remote past, last cath in 2012 at [facility] showed chronic total occlusion of previously stented RCA, with good collaterals, a 40% LAD stenosis, inferior hypokinesis and EF 45%.    He's been lost to Cardiology f/u since 2015, but has not had any recurrent events   Deficiency anemia 08/25/2021   Diabetic polyneuropathy associated with type 2 diabetes mellitus (HCC) 05/16/2022   Dyslipidemia 12/01/2016    Encounter for Medicare annual wellness exam 01/13/2023   Erectile dysfunction due to diseases classified elsewhere 06/12/2019   GERD (gastroesophageal reflux disease)    Hallucination 10/08/2022   Hyperlipidemia    Hypertension    Hypertensive heart disease with heart failure (HCC)    Insomnia 01/28/2021   Iron  deficiency 07/07/2023   Iron  deficiency anemia due to chronic blood loss 08/25/2021   Lesion of vocal cord    Leukoplakia of larynx 11/15/2016   Long-term use of aspirin therapy 10/02/2018   Lumbar back pain 06/04/2022   Lung nodules 08/17/2022   Malfunction of penile prosthesis (HCC) 01/14/2019   Malignant neoplasm of skin 01/28/2021   Formatting of this note might be different from the original. Jan 22, 2003 Entered By: Lonell Rives J Comment: of skin - removed x2 inpast Jan 22, 2003 Entered By: Lonell Rives J Comment: of skin - removed x2 inpast   Melanoma of back (HCC)    melanoma on back   Memory loss 03/08/2020   Moderate recurrent major depression (HCC) 03/02/2023   Mucopurulent chronic bronchitis (HCC) 04/08/2023   Other ill-defined and unknown causes of morbidity and mortality 11/13/1993   Formatting of this note might be different from the original. Jan 31, 2008 Entered By: ARMOUR,ROSS B Comment: lumbar, 8/08 Jan 22, 2003 Entered By: Lonell Rives J Comment: x63yrs in work place  quit 95yrs ago in 2000 Jan 22, 2003 Entered By: Lonell Rives J Comment: x67yrs in work place  quit 60yrs ago in 2000 Jan 31, 2008 Entered By: Dane Dung B Comment: lumbar, 8/08   PAD (peripheral artery disease) (HCC) 10/18/2017   Peripheral vascular disease (HCC)    iliac artery clot   Peyronie's disease 06/12/2019   PVC (premature ventricular contraction) 10/09/2023   RBBB 10/09/2023   Squamous cell carcinoma of larynx (HCC) 12/13/2016   Throat cancer (HCC)    Tobacco use disorder 06/12/2019   Type 2 diabetes mellitus with hyperglycemia, with long-term current use of insulin  (HCC)  07/30/2023   URI, acute 05/31/2023   Urinary urgency 04/08/2023   Ventricular ectopy 11/09/2022   Vertigo 04/08/2023   Vitamin D  deficiency 01/13/2023    Past Surgical History:  Procedure Laterality Date   ABDOMINAL ANGIOGRAM N/A 01/13/2015   Procedure: ABDOMINAL ANGIOGRAM;  Surgeon: Mayo Speck, MD;  Location: [facility] CATH LAB;  Service: Cardiovascular;  Laterality: N/A;   APPENDECTOMY     BACK SURGERY     CARDIAC CATHETERIZATION  2000/2012   with stents in 2000   CHOLECYSTECTOMY     COLONOSCOPY     ELBOW SURGERY     bilateral   ESOPHAGOGASTRODUODENOSCOPY     KNEE SURGERY Left    MICROLARYNGOSCOPY WITH CO2 LASER AND EXCISION OF VOCAL CORD LESION N/A 11/23/2016   Procedure: MICROLARYNGOSCOPY  AND EXCISION OF VOCAL CORD LESION;  Surgeon: Vernadine Golas, MD;  Location: [facility] OR;  Service: ENT;  Laterality: N/A;   MICROLARYNGOSCOPY WITH CO2 LASER AND EXCISION OF VOCAL CORD LESION N/A 01/11/2017   Procedure:  MICROLARYNGOSCOPY WITH CO2 LASER AND EXCISION OF VOCAL CORD LESION;  Surgeon: Vernadine Golas, MD;  Location: [facility] OR;  Service: ENT;  Laterality: N/A;   MICROLARYNGOSCOPY WITH LASER N/A 03/16/2015   Procedure: MICROLARYNGOSCOPY ;  Surgeon: Vernadine Golas, MD;  Location[facility] OR;  Service: ENT;  Laterality: N/A;   peyronie's surgery     SHOULDER SURGERY Right    rotator cuff   SPINE SURGERY  09/2020   cervical surgery. steel plate, bone spurs, allograft.   THROAT SURGERY  1997   cancer removed    Family History  Problem Relation Age of Onset   Cancer Mother        bone   Hypertension Mother    Stroke Father    Hypertension Father    Healthy Child    Cancer Other        brain   Diabetes Other     Social History   Socioeconomic History   Marital status: Married    Spouse name: Not on file   Number of children: 4   Years of education: Not on file   Highest education level: GED or equivalent  Occupational History   Occupation: retired  Tobacco Use   Smoking status: Every Day    Current  packs/day: 1.00    Average packs/day: 1 pack/day for 50.0 years (50.0 ttl pk-yrs)    Types: Cigarettes   Smokeless tobacco: Never   Tobacco comments:    down to .5 ppd  11/02/2022 hfb  Vaping Use   Vaping status: Former  Substance and Sexual Activity   Alcohol use: Not on file   Drug use: No   Sexual activity: Not on file  Other Topics Concern   Not on file  Social History Narrative   Lives with wife       One level      Right hand   Social Drivers of Health   Financial Resource Strain: Low Risk  (11/12/2023)   Overall Financial Resource Strain (CARDIA)    Difficulty of Paying Living Expenses: Not hard at all  Food Insecurity: No Food Insecurity (11/12/2023)   Hunger Vital Sign    Worried About Running Out of Food in the Last Year: Never true    Ran Out of Food in the Last Year: Never true  Transportation Needs: No Transportation Needs (11/12/2023)   PRAPARE - Administrator, Civil Service (Medical): No    Lack of Transportation (Non-Medical): No  Physical Activity: Unknown (11/12/2023)   Exercise Vital Sign    Days of Exercise per Week: 0 days    Minutes of Exercise per Session: Not on file  Recent Concern: Physical Activity - Inactive (11/12/2023)   Exercise Vital Sign    Days of Exercise per Week: 0 days    Minutes of Exercise per Session: 30 min  Stress: No Stress Concern Present (11/12/2023)   [employer] of Occupational Health - Occupational Stress Questionnaire    Feeling of Stress : Only a little  Social Connections: Socially Isolated (11/12/2023)   Social Connection and Isolation Panel [NHANES]    Frequency of Communication with Friends and Family: Once a week    Frequency of Social Gatherings with Friends and Family: Never    Attends Religious Services: Never    Database administrator or Organizations: No    Attends Banker Meetings: Not on file    Marital Status: Married  Intimate Partner Violence: Not At Risk (01/11/2023)  Humiliation, Afraid, Rape, and Kick questionnaire    Fear of Current or Ex-Partner: No    Emotionally Abused: No    Physically Abused: No    Sexually Abused: No    Outpatient Medications Prior to Visit  Medication Sig Dispense Refill   ALPRAZolam  (XANAX ) 0.25 MG tablet Take 1 tablet by mouth twice daily as needed for anxiety 60 tablet 2   arformoterol  (BROVANA ) 15 MCG/2ML NEBU Take 2 mLs (15 mcg total) by nebulization 2 (two) times daily. 120 mL 6   budesonide  (PULMICORT ) 0.25 MG/2ML nebulizer solution Take 2 mLs (0.25 mg total) by nebulization in the morning and at bedtime. 60 mL 11   busPIRone  (BUSPAR ) 7.5 MG tablet Take 1 tablet (7.5 mg total) by mouth 2 (two) times daily. 60 tablet 2   clopidogrel  (PLAVIX ) 75 MG tablet Take 1 tablet by mouth once daily 90 tablet 0   cyanocobalamin  (VITAMIN B12) 1000 MCG/ML injection INJECT 1 ML (CC) INTRAMUSCULARLY ONCE A WEEK (Patient taking differently: Inject 100 mcg into the muscle every 30 (thirty) days.) 10 mL 0   cyclobenzaprine  (FLEXERIL ) 10 MG tablet Take 1 tablet by mouth three times daily as needed for muscle spasm 90 tablet 0   DULoxetine  (CYMBALTA ) 60 MG capsule Take 1 capsule (60 mg total) by mouth 2 (two) times daily. 180 capsule 0   Dupilumab  (DUPIXENT ) 300 MG/2ML SOAJ Inject 300 mg into the skin every 14 (fourteen) days. 12 mL 1   fenofibrate  160 MG tablet Take 1 tablet by mouth once daily 90 tablet 0   finasteride  (PROSCAR ) 5 MG tablet Take 1 tablet by mouth once daily 90 tablet 0   furosemide  (LASIX ) 20 MG tablet Take 1 tablet by mouth once daily 90 tablet 0   gabapentin  (NEURONTIN ) 400 MG capsule TAKE 1 CAPSULE BY MOUTH THREE TIMES DAILY 270 capsule 1   insulin  lispro (HUMALOG ) 100 UNIT/ML injection Inject 0.04 mLs (4 Units total) into the skin 3 (three) times daily with meals. 10 mL 11   Insulin  Pen Needle (PEN NEEDLES) 31G X 8 MM MISC Inject insulin  as directed 100 each 1   Insulin  Syringe-Needle U-100 31G X 5/16" 0.3 ML MISC  Inject insulin  as directed 100 each 0   ipratropium-albuterol  (DUONEB) 0.5-2.5 (3) MG/3ML SOLN USE 1 AMPULE IN NEBULIZER 4 TIMES DAILY 360 mL 0   Lancets (ONETOUCH DELICA PLUS LANCET33G) MISC USE 1  TO CHECK GLUCOSE THREE TIMES DAILY (Patient taking differently: 1 each by Other route 3 (three) times daily.) 100 each 0   levothyroxine  (SYNTHROID ) 25 MCG tablet Take 1 tablet by mouth once daily 90 tablet 0   losartan  (COZAAR ) 50 MG tablet Take 1 tablet (50 mg total) by mouth daily. 90 tablet 2   MAG64 64 MG TBEC Take 1 tablet by mouth once daily 90 tablet 0   metFORMIN  (GLUCOPHAGE ) 500 MG tablet Take 2 tablets (1,000 mg total) by mouth 2 (two) times daily with a meal.     metoprolol  succinate (TOPROL -XL) 100 MG 24 hr tablet Take 1 tablet (100 mg total) by mouth daily. Take with or immediately following a meal. 90 tablet 2   nitroGLYCERIN  (NITROSTAT ) 0.4 MG SL tablet Place 1 tablet (0.4 mg total) under the tongue every 5 (five) minutes as needed for chest pain. DISSOLVE ONE TABLET UNDER THE TONGUE EVERY 5 TO 10 MINUTES PRIOR TO ACTIVITIES WHICH MIGHT PRECIPITATE AN ATTACK 25 tablet 0   nystatin cream (MYCOSTATIN) Apply 1 application topically 2 (two) times daily as  needed for dry skin.     omega-3 acid ethyl esters (LOVAZA ) 1 g capsule Take 2 capsules (2 g total) by mouth 2 (two) times daily. 360 capsule 0   Omega-3 Fatty Acids (FISH OIL ) 1000 MG CAPS Take 2 capsules (2,000 mg total) by mouth in the morning and at bedtime. 180 capsule 12   ONETOUCH ULTRA test strip USE 1 STRIP TO CHECK GLUCOSE IN THE MORNING AND 1 AT BEDTIME 100 each 0   oxyCODONE -acetaminophen  (PERCOCET/ROXICET) 5-325 MG tablet Take 1 tablet by mouth 2 (two) times daily as needed for severe pain (pain score 7-10). 60 tablet 0   pantoprazole  (PROTONIX ) 40 MG tablet Take 1 tablet (40 mg total) by mouth daily. 90 tablet 0   predniSONE  (DELTASONE ) 10 MG tablet Take 1 tablet by mouth once daily with breakfast 30 tablet 2   revefenacin   (YUPELRI ) 175 MCG/3ML nebulizer solution Take 3 mLs (175 mcg total) by nebulization daily. 90 mL 11   rosuvastatin  (CRESTOR ) 20 MG tablet Take 1 tablet (20 mg total) by mouth daily. 90 tablet 3   spironolactone  (ALDACTONE ) 25 MG tablet Take 1 tablet (25 mg total) by mouth daily. 90 tablet 3   tamsulosin  (FLOMAX ) 0.4 MG CAPS capsule TAKE 2 CAPSULES BY MOUTH ONCE DAILY AFTER SUPPER 180 capsule 0   torsemide  (DEMADEX ) 20 MG tablet Take 1 tablet (20 mg total) by mouth as needed (edema). 30 tablet 0   TOUJEO  SOLOSTAR 300 UNIT/ML Solostar Pen Inject 40 Units into the skin daily. Inject 40 units daily, increase by 2 units every 3 days until sugars are constantly 140 or lower. Max dose of 60 units daily. 18 mL 0   traZODone  (DESYREL ) 150 MG tablet Take 1 tablet by mouth once daily 270 tablet 0   VENTOLIN  HFA 108 (90 Base) MCG/ACT inhaler INHALE 2 PUFFS BY MOUTH EVERY 6 HOURS AS NEEDED FOR SHORTNESS OF BREATH FOR WHEEZING (Patient taking differently: Inhale 2 puffs into the lungs every 6 (six) hours as needed for shortness of breath or wheezing.) 18 g 0   Vitamin D , Ergocalciferol , (DRISDOL ) 1.25 MG (50000 UNIT) CAPS capsule TAKE 1 CAPSULE BY MOUTH TWICE A WEEK 12 capsule 3   Facility-Administered Medications Prior to Visit  Medication Dose Route Frequency Provider Last Rate Last Admin   ipratropium-albuterol  (DUONEB) 0.5-2.5 (3) MG/3ML nebulizer solution 3 mL  3 mL Nebulization Once         Allergies  Allergen Reactions   Penicillins Rash and Hives    Has patient had a PCN reaction causing immediate rash, facial/tongue/throat swelling, SOB or lightheadedness with hypotension:unsure Has patient had a PCN reaction causing severe rash involving mucus membranes or skin necrosis:unsure Has patient had a PCN reaction that required hospitalization:No Has patient had a PCN reaction occurring within the last 10 years:No If all of the above answers are "NO", then may proceed with Cephalosporin use.  rash    Bactrim  [Sulfamethoxazole -Trimethoprim ]     Dizziness, confusion, involuntary movements   Iodinated Contrast Media Rash    Also developed blisters   Penicillin G Rash   Tramadol Rash    Review of Systems  Constitutional: Negative for chills, diaphoresis, fever and malaise/fatigue.  HENT: Negative for congestion, ear pain and sore throat.   Respiratory: Negative for cough and shortness of breath.   Cardiovascular: Negative for chest pain and palpitations.  Gastrointestinal: Negative for abdominal pain, constipation, diarrhea, nausea and vomiting.  Genitourinary: Negative for dysuria and urgency.  Musculoskeletal: Negative for myalgias. Negative  for arthralgias. Skin: no abnormal moles or rashes. Neurological: Negative for dizziness and headaches.  Psychiatric/Behavioral: Negative for depression. Negative for anhedonia. Negative for anxiety.        Objective:    Physical Exam  BP 127/60   Pulse 70   Temp 98.9 F (37.2 C)   SpO2 98%  Wt Readings from Last 3 Encounters:  02/28/24 201 lb (91.2 kg)  01/08/24 198 lb (89.8 kg)  12/12/23 198 lb (89.8 kg)    Health Maintenance Due  Topic Date Due   OPHTHALMOLOGY EXAM  Never done   Hepatitis C Screening  Never done   Zoster Vaccines- Shingrix (1 of 2) Never done   FOOT EXAM  01/11/2024   Medicare Annual Wellness (AWV)  01/13/2024    There are no preventive care reminders to display for this patient.   Lab Results  Component Value Date   TSH 2.280 02/28/2024   Lab Results  Component Value Date   WBC 16.1 (H) 02/28/2024   HGB 14.9 02/28/2024   HCT 46.1 02/28/2024   MCV 95 02/28/2024   PLT 195 02/28/2024   Lab Results  Component Value Date   NA 134 02/28/2024   K 4.6 02/28/2024   CO2 26 02/28/2024   GLUCOSE 467 (H) 02/28/2024   BUN 19 02/28/2024   CREATININE 1.10 02/28/2024   BILITOT 0.5 02/28/2024   ALKPHOS 76 02/28/2024   AST 22 02/28/2024   ALT 21 02/28/2024   PROT 6.2 02/28/2024   ALBUMIN 3.8 02/28/2024    CALCIUM  8.8 02/28/2024   ANIONGAP 7 11/10/2022   EGFR 70 02/28/2024   GFR 74.13 08/17/2022   Lab Results  Component Value Date   CHOL 120 02/28/2024   Lab Results  Component Value Date   HDL 27 (L) 02/28/2024   Lab Results  Component Value Date   LDLCALC 23 02/28/2024   Lab Results  Component Value Date   TRIG 517 (H) 02/28/2024   Lab Results  Component Value Date   CHOLHDL 4.4 02/28/2024   Lab Results  Component Value Date   HGBA1C 11.8 (H) 02/28/2024       Assessment & Plan:  COPD exacerbation (HCC) Assessment & Plan: Doxycycline and prednisone  taper sent.   Orders: -     Doxycycline Hyclate; Take 1 tablet (100 mg total) by mouth 2 (two) times daily.  Dispense: 20 tablet; Refill: 0 -     predniSONE ; Take 3 tablets (60 mg total) by mouth daily with breakfast for 3 days, THEN 2 tablets (40 mg total) daily with breakfast for 3 days, THEN 1 tablet (20 mg total) daily with breakfast for 3 days.  Dispense: 18 tablet; Refill: 0  Chronic respiratory failure with hypoxia (HCC) Assessment & Plan: Use oxygen  to maintain pulse ox > 90%.   Acute non-recurrent maxillary sinusitis Assessment & Plan: Doxycycline prescription sent.   Orders: -     Doxycycline Hyclate; Take 1 tablet (100 mg total) by mouth 2 (two) times daily.  Dispense: 20 tablet; Refill: 0  Diabetic polyneuropathy associated with type 2 diabetes mellitus (HCC) Assessment & Plan: Improved, but anticipate worsening when starts prednisone  taper.  Increase toujeo  to 50 U daily. Continue novolog  4 U before meals. Hold on increasing by 2 U every 3 days until back to prednisone  5-10 mg daily scheduled  Watch for low sugars.      No orders of the defined types were placed in this encounter.    Meds ordered this encounter  Medications  doxycycline (VIBRA-TABS) 100 MG tablet    Sig: Take 1 tablet (100 mg total) by mouth 2 (two) times daily.    Dispense:  20 tablet    Refill:  0    Has tolerated  doxycycline. No longer allergic.   predniSONE  (DELTASONE ) 20 MG tablet    Sig: Take 3 tablets (60 mg total) by mouth daily with breakfast for 3 days, THEN 2 tablets (40 mg total) daily with breakfast for 3 days, THEN 1 tablet (20 mg total) daily with breakfast for 3 days.    Dispense:  18 tablet    Refill:  0    Signed, Mercy Stall, MD  03/18/2024 11:10 AM    [facility] [place]

## 2024-03-19 ENCOUNTER — Telehealth: Payer: Self-pay

## 2024-03-19 NOTE — Telephone Encounter (Signed)
 Contacted Don [name] to schedule their annual wellness visit. Call back at later date: Wife stated she will call back to schedule.

## 2024-03-24 ENCOUNTER — Other Ambulatory Visit: Payer: Self-pay | Admitting: Family Medicine

## 2024-03-24 ENCOUNTER — Other Ambulatory Visit: Payer: Self-pay | Admitting: Cardiology

## 2024-03-25 ENCOUNTER — Encounter: Payer: Self-pay | Admitting: Internal Medicine

## 2024-03-26 ENCOUNTER — Ambulatory Visit: Admitting: Internal Medicine

## 2024-03-26 ENCOUNTER — Encounter: Payer: Self-pay | Admitting: Internal Medicine

## 2024-03-26 VITALS — BP 135/61 | HR 84 | Ht 70.0 in | Wt 197.0 lb

## 2024-03-26 DIAGNOSIS — F17210 Nicotine dependence, cigarettes, uncomplicated: Secondary | ICD-10-CM | POA: Diagnosis not present

## 2024-03-26 DIAGNOSIS — J329 Chronic sinusitis, unspecified: Secondary | ICD-10-CM

## 2024-03-26 DIAGNOSIS — F17200 Nicotine dependence, unspecified, uncomplicated: Secondary | ICD-10-CM

## 2024-03-26 DIAGNOSIS — J449 Chronic obstructive pulmonary disease, unspecified: Secondary | ICD-10-CM

## 2024-03-26 DIAGNOSIS — R053 Chronic cough: Secondary | ICD-10-CM

## 2024-03-26 MED ORDER — SODIUM CHLORIDE 3 % IN NEBU: INHALATION_SOLUTION | RESPIRATORY_TRACT | 11 refills | Status: DC

## 2024-03-26 NOTE — Patient Instructions (Addendum)
    Stage 3 severe COPD by GOLD classification (HCC) COPD, frequent exacerbations (HCC) Severe chronic cough with chronic bronchitis   Plan  - Continue Yupelri , Brovana , DuoNeb and Dupixent  as before -Stop fish oil  to see if you might have any silent acid reflux it is negatively impacting her lung  -= Continue chronic daily prednisone   #Chronic cough due to chronic bronchitis/COPD and also laryngopharyngeal reflux diagnosed by Dr. Donalee Fruits in April 2024  Plan  - -  TOO BAD  OTHUVAYRE TOO EXPENSIVE and not started - OTC MULLEIN from [facility]  - 3 mL of 3% hypertonic saline nebulizer twice daily for palliative relief of cough in the setting of chronic bronchitis [handwritten prescription given at patient request] - If these strategies do not work we will consider Hycodan or Tussionex   Tobacco abuse disorder   -Smoking remains a risk factor for frequent exacerbations and other lung diseases and heart disease  Plan -Quit smoking  Chronic sinusitis, unspecified location  - April 2024 ENT evaluation by Dr. Mariella Shore was considered benign.  Plan - According to ENT.    Follow up  Nurse practitioner in 12 weeks

## 2024-03-26 NOTE — Progress Notes (Signed)
 IOV 04/05/2018  Chief Complaint  Patient presents with   Consult    Referred by Dr. Mercy Stall due to COPD. Pt was diagnosed in 2017 and states symptoms have become worse. Pt does have c/o SOB with exertion and also cough first thing in morning.    Don [name] 73 y.o. male from [address] [place] [address] -presents for new evaluation.  His wife and that is a good historian and gives most of the history.  History is gained from him and review of the old chart.  I also visualized the CT scan images from April 2019 and all the way back through April 2017 personally and agree with the final report  He is an ongoing smoker with a previous history of throat cancer and also chronic systolic heart failure status post cardiac stent for many many years with a stable ejection fraction of 35% being followed at [facility].  For the last 3 years he has had worsening shortness of breath.  Initially given a diagnosis of COPD not otherwise specified.  Wife says that he has had progressive shortness of breath since then.  His maintenance inhaler the Spiriva  but he is also on Pulmicort  nebulizers twice daily which is not mentioned in his med list.  For the last 3 years except for the year 2018 he has had admissions once a year for COPD exacerbation.  He also goes multiple rounds of prednisone  and antibiotics for outpatient COPD exacerbation.  Most recent prednisone  antibiotic was in December 2018.  They are frustrated with the repeated flareups.  Shortness of breath is associate with cough and wheezing.  This class III in exertion severity.  Walking from the bedroom to the porch makes him short of breath and relieved by rest there is no associated chest pain.  Also this past year he has had increasing unsteadiness of his lower extremities which is attributed to back issues and apparently he has been to neurology and spine surgery and a spine surgeon is having a plan for undergoing to our spinal surgery  and  Don [name] .  They are asking about preoperative respiratory assessment as well.     Walking desaturation test today.  We normally 285 feet x 3 laps on room air: He only did one lap.  He needed 2 people to walk with him.  He was extremely unsteady on his lower extremities.  He did not desaturate he just became tachycardic.  Pulse ox was held up in the high 90s   Chronic obstructive pulmonary disease, unspecified COPD type (HCC)  - copd is stable at this point - continue o2 at night - continue spiriva  daily - I recommend adding breo to the regimen scheduled daily - recommend using duoneb as needed  - recommend holding off pulmicort  - recommend you talk to cadiologist and change lisinopril  to another class; and see if cough improved - later at followu[p can discuss role of roflumilast  in preventing flareups - ideally you also need pulmonary rehab but right now wobbly legs need to be addressed first  Preoperative respiratory examination - Moderate risk for any pulmonary complication such as pneumonia following surgery - Low moderate risk for prolonged ventilator dependence  - At time of surgery ideal is that he is flare up free for a month and has quit smoking for a month - this helps reduce risk further  Physical deconditioning  - this is a major issue more than copd causing you to be  fatigued and winded - only sorting out the back can improve this  Multiple lung nodules on CT - stable April 2017 - April 2019 - followup per throat cancer doctor I suppose   Followup  - 6-8 weeks to see response   OV 08/12/2018     HPI Don [name] 73 y.o. -presents for COPD follow-up.  Presents with his wife.  Since his last visit he has had shoulder and spine surgery successfully.  He continues to smoke however.  In the last 1 month wife is reporting deterioration in cough wheezing and dyspnea and chest tightness.  Sputum is changed from white in color to yellow-brown.   She is very frustrated by the deterioration.  She agrees that lisinopril  can be contributing to cough because she herself suffered from that.  Nevertheless she is frustrated extremely.  She is asking if any medicines can prevent COPD exacerbations.  Review of the chart indicates April 2019 he did have CT scan of the chest which showed chronic stable nodules.  He has throat cancer.  He has some occasional dysphagia.  The wife is wondering if it is because of thick mucus.  Apparently his esophagus has been stretch.  ENT appointment is pending today.  CAT score is worse at 27.  He continues to smoke.   08/17/2022: OV with [name],NP for follow-up visit.  Last seen November 2020.  Underlying COPD.  Currently on Breo and Spiriva .  Having difficulty using his inhalers.  Does have some memory impairment.  Also feels like he cannot get enough of the medicine out of the inhalers.  Just feels like his breathing is gotten slowly worse over the last couple years.  Gets short of breath with minimal activities.  Lives a relatively sedentary lifestyle.  Does continue to smoke.  Discussed smoking cessation.  He is on 2 L of oxygen  at bedtime.  Spirometry in the office showed FEV1 of 31% and ratio 42.  Formal PFTs ordered for further evaluation.  Changed to triple therapy nebulizer regimen to see if we can have better control over his symptoms.  Pulmonary nodules identified on previous CT scan.  Plan for repeat-ordered at visit.  11/02/2022: Today-follow-up Patient presents today for follow-up after undergoing pulmonary function testing.  He has severe obstructive defect with reversibility and severe diffusion defect.  He has been doing a little bit better with triple therapy regimen.  Feels like he is actually getting the medicine and it opens up his chest little bit.  Continues to have daily symptoms of dyspnea, which limit his activities.  He also continues to have daily symptoms of productive cough with white to brown  sputum.  This is baseline for him.  He has had multiple exacerbations over the past year requiring antibiotics and steroids.  His wife wants to know if we can put him on something to help prevent future flareups.  No increased chest congestion or wheezing.  No hemoptysis, weight loss or anorexia.  CT scan results are not available yet.  He does continue to smoke daily; down to 4 cigarettes a day.  He has tried multiple different things in the past to help him quit without any benefit.  OV 01/04/2023  Subjective:  Patient ID: Don [name], male , DOB: Sep 27, 1951 , age 25 y.o. , MRN: [id number] , ADDRESS: [address] [place] [address] PCP Mercy Stall, MD Patient Care Team: Mercy Stall, MD as PCP - General (Family Medicine) Revankar, Micael Adas, MD as PCP -  Cardiology (Cardiology) Hassan Links, MD as Referring Physician (Cardiology) Vernadine Golas, MD as Consulting Physician (Otolaryngology) Devon Fogo, [facility] as Pharmacist (Pharmacist)  This Provider for this visit: Treatment Team:  Attending Provider: Maire Scot, MD    01/04/2023 -   Chief Complaint  Patient presents with   Follow-up    Patient states he can't breathe. Patient coughing up yellow and white phlegm.     HPI Don [name] 73 y.o. -returns for follow-up.  I personally not seen him in a few years.  He has been seeing nurse practitioner multiple times in 2023.  According to him and his wife.  Wife is acting as an independent historian he had multiple exacerbations at least 7 or 9 in 2023 is requiring prednisone  and antibiotics.  They insist that Levaquin  and prednisone  each for 7 days is the only thing that works for him.  They will not accept any other antibiotic therapy.  For this routine visit there endorsing that he is feeling worse for the last 2 days they feel there is a flareup.  They feel there is an antibiotic and prednisone  indicated.  They specifically want Levaquin  and prednisone .  Wife also  believes that he might be aspirating and this might be a risk factor.  Review of the records indicate that he had high blood eosinophils in the past.  In the past he has been on Daliresp  to prevent exacerbations this apparently worked well but it is expensive.  He never been on azithromycin .  Never been on Dupixent .    CT Chest data 10/31/22  Narrative & Impression  CLINICAL DATA:  COPD, dyspnea on exertion. History of throat cancer and diabetes.   EXAM: CT CHEST WITHOUT CONTRAST   TECHNIQUE: Multidetector CT imaging of the chest was performed following the standard protocol without IV contrast.   RADIATION DOSE REDUCTION: This exam was performed according to the departmental dose-optimization program which includes automated exposure control, adjustment of the mA and/or kV according to patient size and/or use of iterative reconstruction technique.   COMPARISON:  CT 04/30/2020.  Radiographs 08/17/2022.   FINDINGS: Cardiovascular: Atherosclerosis of the aorta, great vessels and coronary arteries. No acute vascular findings are seen on noncontrast imaging. The heart size is normal. There is stable anterior pericardial thickening versus a small amount of pericardial fluid.   Mediastinum/Nodes: There are no enlarged mediastinal, hilar or axillary lymph nodes.Hilar assessment is limited by the lack of intravenous contrast, although the hilar contours appear unchanged. The thyroid  gland, trachea and esophagus demonstrate no significant findings.   Lungs/Pleura: No pleural effusion or pneumothorax. Moderate centrilobular and paraseptal emphysema with stable biapical scarring. Multiple predominately right-sided pulmonary nodules are again noted, including and densely calcified right lower lobe granuloma. The new right upper lobe nodule seen on the most recent CT is stable over the interval, measuring 4 mm on image 23/5. There is new mild clustered nodularity in the left lower  lobe on axial images 70 through 76, likely postinflammatory or mucous impacted terminal bronchioles. No airspace disease.   Upper abdomen: No significant findings are seen within the visualized upper abdomen. There are scattered splenic granulomas. Mildly prominent ingested material in the stomach. Previous cholecystectomy.   Musculoskeletal/Chest wall: There is no chest wall mass or suspicious osseous finding. Mild spondylosis post lower cervical fusion.   IMPRESSION: 1. No acute chest findings identified. 2. New mild clustered nodularity in the left lower lobe, likely postinflammatory or mucous impacted terminal bronchioles. No airspace disease or  suspicious nodularity. 3. Additional scattered small pulmonary nodules are unchanged from priors, consistent with a benign etiology. 4. Sequela of prior granulomatous disease with calcified right lower lobe granuloma and scattered splenic granulomas. 5. Aortic Atherosclerosis (ICD10-I70.0) and Emphysema (ICD10-J43.9).     Electronically Signed   By: Elmon Hagedorn M.D.   On: 11/03/2022 16:58        Latest Reference Range & Units 12/16/20 09:13 11/02/21 14:00 03/14/22 09:43 05/19/22 08:23 09/29/22 12:39  EOS (ABSOLUTE) 0.0 - 0.4 x10E3/uL 0.3 0.3 0.3 0.3 0.2       OV 02/15/2023  Subjective:  Patient ID: Don [name], male , DOB: [birth year] , age 37 y.o. , MRN: [id number] , ADDRESS: [address] [place] [address] PCP Mercy Stall, MD Patient Care Team: Mercy Stall, MD as PCP - General (Family Medicine) Revankar, Micael Adas, MD as PCP - Cardiology (Cardiology) Lei Pump, MD as PCP - Electrophysiology (Cardiology) Hassan Links, MD as Referring Physician (Cardiology) Vernadine Golas, MD as Consulting Physician (Otolaryngology) Devon Fogo, [facility] as Pharmacist (Pharmacist)  This Provider for this visit: Treatment Team:  Attending Provider: Drema Genta, NP    02/15/2023 -   Chief Complaint   Patient presents with   Follow-up    Thick phlegm, wife wants Dr to hear lungs.       HPI Don [name] 73 y.o. -here to review results and also update on progress.  Since last seeing me including the last visit he has had 4 rounds of antibiotics and prednisone  of which 2 was Levaquin  1 with Bactrim  and 1 with Keflex  please.  Most recent antibiotic completion was yesterday most recent prednisone  completion was yesterday.  The wife is frustrated and he is also frustrated.  None of these treatment rounds are helping.  The phlegm is very thick.  He has to cough to bring the phlegm out.  Vibratory vest helped him.  He chokes because of the thick phlegm.  Most of the time it is white but sometimes it is green-yellow brown.  They try to give sputum sample but I believe the sputum was not sent because there was no order.  She is asking for a chest x-ray at this point in time.  December 2023 CT scan of the chest did not show any lung cancer.  She did have CT sinus March 2024 at [facility].  I do not have the images but it shows pansinusitis.  Wife is able to get ENT appointment mid April 2024 at [facility] in [place].  He has a history of throat cancer and has been seen by ENT in the past.  He continues to have hoarse voice.  He also has elevated eosinophils we prescribed Dupixent  but this is yet to start.  Because of the high co-pay to try to get it through charity.  Had    CT SINUS MARCH 2024    IMPRESSION: Chronic frontal, ethmoid, sphenoid and a maxillary sinusitis. There are no air-fluid levels. Postsurgical changes are noted in maxillary and ethmoid sinuses.  Cortical sulci in the brain appear prominent suggesting atrophy. Scattered arterial calcifications are seen.   Electronically Signed By: Craven Do M.D. On: 02/01/2023 09:48      Latest Reference Range & Units 08/17/22 10:15 01/04/23 12:08  Eosinophils Absolute 0.0 - 0.7 K/uL 0.3 0.3    SEPT 2024 Patient presents  today for acute video visit.  Accompanied by his wife.  Patient has severe COPD with recurrent respiratory infections requiring antibiotics  and prednisone , especially in the winter.  He currently has a cough with yellow mucus.  Last course of prednisone  was 1 to 2 weeks ago. He is maintained on Pulmicort , Brovana , Yupelri  and Dupixent .  He wears 2L oxygen  at night.  Patient and wife are inquiring about starting a daily antibiotic to prevent flareups, they have previously discussed this with Dr. Bertrum Brodie.    Severe COPD with recurrent exacerbations - Starting Azithromycin  MWF to decrease flare ups - Sending in prednisone  taper for acute symptoms - Continue Pulmicort , Brovana , Yupelri  and Dupixent , if no improvement at follow-up may need to consider daily low-dose prednisone  and/or starting PDE inhibitor.  Chronic respiratory failure - Continue 2L oxygen  at bedtime   Follow Up Instructions:  4-6 weeks with EKG     OV 09/18/2023  Subjective:  Patient ID: Don [name], male , DOB: 1951-09-19 , age 62 y.o. , MRN: [id number] , ADDRESS: [address] [place] [address] PCP Mercy Stall, MD Patient Care Team: Mercy Stall, MD as PCP - General (Family Medicine) Hassan Links, MD as Referring Physician (Cardiology) Vernadine Golas, MD as Consulting Physician (Otolaryngology) Devon Fogo, [facility] (Inactive) as Pharmacist (Pharmacist)  This Provider for this visit: Treatment Team:  Attending Provider: Maire Scot, MD    09/18/2023 -   Chief Complaint  Patient presents with   Acute Visit    Pt states his breathing is off. Pt daughter states she think he is best on steroids.      HPI Don [name] 73 y.o. -presents with his wife and had stridor.  She is the main historian.  She tells me that he is now on Dupixent  may be exacerbations of a little bit less.  Best Sueanne Emerald for tomorrow azithromycin  prophylaxis but this has not helped.  They want to stop it.  He is frustrated  by his back pain.  She says that every time she takes prednisone  he feels better and then as the prednisone  comes off a few days later he will have a flareup again.  Currently when she made the acute visit he was having an exacerbation but he is off prednisone  and is currently well but she worries in few days he will flareup.  I have agreed to give him a prednisone  pack.  Did discuss new medication for COPD that improves lung function and prevent flareups.  Send nebulizer OThyuvayre it does help FEV1 improvement relatively at least 200 cc over 6 months.  There is block box warning for increased psychiatric disturbance but they are okay with this risk.  Did indicate to them if this keeps happening then we will do steroids  Other issues - Working with cardiology and cardiac issues his BNP was recently high - Continue smoking: Continues to smoke.  He is struggling to quit.  Offered referral to [facility] quit smoking cessation clinic and he is agreeable to do that. -Echocardiogram September 2020 for EF 55%.     OV 03/26/2024  Subjective:  Patient ID: Don [name], male , DOB: [birth year] , age 26 y.o. , MRN: [id number] , ADDRESS: [address] [place] [address] PCP Mercy Stall, MD Patient Care Team: Mercy Stall, MD as PCP - General (Family Medicine) Hassan Links, MD as Referring Physician (Cardiology) Vernadine Golas, MD as Consulting Physician (Otolaryngology) Devon Fogo, [facility] (Inactive) as Pharmacist (Pharmacist)  This Provider for this visit: Treatment Team:  Attending Provider: Maire Scot, MD   #Gold stage III COPD with nocturnal oxygen  use #COPD  frequent exacerbated.  - Did not tolerate azithromycin  in 2024  -0 on Dupixent  since 2024  - Could not afford OTHUVYARE in 2024  0 on chronic prednisone  Yupelri , Brovana , DuoNeb # Chronic systolic dysfunction December 2023 EF 45% # Ongoing 50 pack smoker # History of throat cancer # Chronic sinusitis seen on CT scan of the  sinus March [facility]24 at [facility] # #Chronic cough  -0 reassuring evaluation by Dr. Donalee Fruits in 2020 for April. # Mild chronic elevations in blood eosinophil counts # #CT chest February 2025 without lung cancer   03/26/2024 -   Chief Complaint  Patient presents with   Follow-up    He has some lingering chest congestion and is requesting something to help with this. He coughs up minimal clear to yellow sputum.  He is on last day of round of doxy.      HPI Don [name] 73 y.o. -///presents for follow-up with his wife.  He tells me he is stable.  Wife says that he is stable.  He continues to smoke..  Wife is concerned that every time he comes of an exacerbation he relapses back.  She is most concerned about his chronic cough and the Byrnett process.  He is she says that sputum is stuck in his throat and he is not able to bring it up.  This then causes choking she is asking for medication.  He is fully optimized with COPD care.  He could not afford OTHYVAYRE.  We discussed the hypertonic saline nebulizer and she is interested.  We also discussed over-the-counter herbal extract Mulllein based on anecdotal experience that was positive from certain patients in our practice.  She is willing to give this a try.  There are no other new issues.    PFT     Latest Ref Rng & Units 10/26/2022   10:46 AM  PFT Results  FVC-Pre L 2.32   FVC-Predicted Pre % 58   FVC-Post L 2.84   FVC-Predicted Post % 71   Pre FEV1/FVC % % 48   Post FEV1/FCV % % 44   FEV1-Pre L 1.11   FEV1-Predicted Pre % 38   FEV1-Post L 1.24   DLCO uncorrected ml/min/mmHg 9.16   DLCO UNC% % 38   DLCO corrected ml/min/mmHg 9.59   DLCO COR %Predicted % 40   DLVA Predicted % 48        LAB RESULTS last 96 hours No results found.       has a past medical history of Absence of bladder continence (01/08/2022), Acute cough (07/07/2023), Acute infection of nasal sinus (01/13/2023), Anxiety state (02/02/2022), Arthritis,  Asymptomatic LV dysfunction (10/18/2017), Atherosclerosis of aorta (HCC) (01/13/2023), Atypical chest pain (11/09/2022), B12 deficiency anemia (08/25/2021), Basal cell carcinoma, Benign prostatic hyperplasia with incomplete bladder emptying (01/14/2019), Bone pain (03/08/2020), Cancer (HCC), Cardiomyopathy, secondary (HCC) (12/01/2016), Chest pain syndrome (11/02/2017), Chronic coronary artery disease (10/18/2017), Chronic ischemic heart disease (11/13/1998), Chronic neck pain (05/16/2022), Chronic pain syndrome (06/04/2022), Chronic respiratory failure with hypoxia (HCC) (05/16/2022), CKD (chronic kidney disease) stage 3, GFR 30-59 ml/min (HCC) (07/25/2019), COPD (chronic obstructive pulmonary disease) (HCC), COPD exacerbation (HCC) (07/30/2023), Coronary artery disease involving native coronary artery of native heart with angina pectoris (HCC) (12/01/2016), Deficiency anemia (08/25/2021), Diabetic polyneuropathy associated with type 2 diabetes mellitus (HCC) (05/16/2022), Dyslipidemia (12/01/2016), Encounter for Medicare annual wellness exam (01/13/2023), Erectile dysfunction due to diseases classified elsewhere (06/12/2019), GERD (gastroesophageal reflux disease), Hallucination (10/08/2022), Hyperlipidemia, Hypertension, Hypertensive heart disease with heart failure (HCC), Insomnia (01/28/2021),  Iron  deficiency (07/07/2023), Iron  deficiency anemia due to chronic blood loss (08/25/2021), Lesion of vocal cord, 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), Long-term use of aspirin therapy (10/02/2018), Lumbar back pain (06/04/2022), Lung nodules (08/17/2022), Malfunction of penile prosthesis (HCC) (01/14/2019), Malignant neoplasm of skin (01/28/2021), Melanoma of back (HCC), Memory loss (03/08/2020), Moderate recurrent major depression (HCC) (03/02/2023), Mucopurulent chronic bronchitis (HCC) (04/08/2023), Other ill-defined and unknown causes of morbidity and mortality (11/13/1993), PAD (peripheral artery disease) (HCC)  (10/18/2017), Peripheral vascular disease (HCC), Peyronie's disease (06/12/2019), PVC (premature ventricular contraction) (10/09/2023), RBBB (10/09/2023), Squamous cell carcinoma of larynx (HCC) (12/13/2016), Throat cancer (HCC), Tobacco use disorder (06/12/2019), Type 2 diabetes mellitus with hyperglycemia, with long-term current use of insulin  (HCC) (07/30/2023), URI, acute (05/31/2023), Urinary urgency (04/08/2023), Ventricular ectopy (11/09/2022), Vertigo (04/08/2023), and Vitamin D  deficiency (01/13/2023).   reports that he has been smoking cigarettes. He has a 50 pack-year smoking history. He has never used smokeless tobacco.  Past Surgical History:  Procedure Laterality Date   ABDOMINAL ANGIOGRAM N/A 01/13/2015   Procedure: ABDOMINAL ANGIOGRAM;  Surgeon: Mayo Speck, MD;  Location: [facility] CATH LAB;  Service: Cardiovascular;  Laterality: N/A;   APPENDECTOMY     BACK SURGERY     CARDIAC CATHETERIZATION  2000/2012   with stents in 2000   CHOLECYSTECTOMY     COLONOSCOPY     ELBOW SURGERY     bilateral   ESOPHAGOGASTRODUODENOSCOPY     KNEE SURGERY Left    MICROLARYNGOSCOPY WITH CO2 LASER AND EXCISION OF VOCAL CORD LESION N/A 11/23/2016   Procedure: MICROLARYNGOSCOPY  AND EXCISION OF VOCAL CORD LESION;  Surgeon: Vernadine Golas, MD;  Location: [facility] OR;  Service: ENT;  Laterality: N/A;   MICROLARYNGOSCOPY WITH CO2 LASER AND EXCISION OF VOCAL CORD LESION N/A 01/11/2017   Procedure: MICROLARYNGOSCOPY WITH CO2 LASER AND EXCISION OF VOCAL CORD LESION;  Surgeon: Vernadine Golas, MD;  Location: [facility] OR;  Service: ENT;  Laterality: N/A;   MICROLARYNGOSCOPY WITH LASER N/A 03/16/2015   Procedure: MICROLARYNGOSCOPY ;  Surgeon: Vernadine Golas, MD;  Location: [facility] OR;  Service: ENT;  Laterality: N/A;   peyronie's surgery     SHOULDER SURGERY Right    rotator cuff   SPINE SURGERY  09/2020   cervical surgery. steel plate, bone spurs, allograft.   THROAT SURGERY  1997   cancer removed    Allergies  Allergen Reactions    Penicillins Rash and Hives    Has patient had a PCN reaction causing immediate rash, facial/tongue/throat swelling, SOB or lightheadedness with hypotension:unsure Has patient had a PCN reaction causing severe rash involving mucus membranes or skin necrosis:unsure Has patient had a PCN reaction that required hospitalization:No Has patient had a PCN reaction occurring within the last 10 years:No If all of the above answers are "NO", then may proceed with Cephalosporin use.  rash   Bactrim  [Sulfamethoxazole -Trimethoprim ]     Dizziness, confusion, involuntary movements   Iodinated Contrast Media Rash    Also developed blisters   Penicillin G Rash   Tramadol Rash    Immunization History  Administered Date(s) Administered   Fluad Quad(high Dose 65+) 08/16/2020, 09/01/2021, 08/17/2022   Fluad Trivalent(High Dose 65+) 10/10/2023   H1N1 09/13/2008   Influenza Split 09/01/2021   Influenza, High Dose Seasonal PF 10/13/2017, 08/26/2019, 08/16/2020   Influenza-Unspecified 11/13/2002, 08/19/2004, 09/13/2004, 09/13/2006, 10/17/2007, 08/12/2008, 10/13/2017, 08/26/2019   Moderna SARS-COV2 Booster Vaccination 10/12/2020   Moderna Sars-Covid-2 Vaccination 10/12/2020   PNEUMOCOCCAL CONJUGATE-20 01/11/2023   Pfizer(Comirnaty)Fall Seasonal Vaccine 12 years and older 09/22/2022, 10/10/2023  Pneumococcal Conjugate-13 03/13/2000, 10/01/2015   Pneumococcal Polysaccharide-23 11/13/2002, 08/13/2009   Pneumococcal-Unspecified 03/13/2000   Td 11/13/1996   Td (Adult),5 Lf Tetanus Toxid, Preservative Free 11/13/1996    Family History  Problem Relation Age of Onset   Cancer Mother        bone   Hypertension Mother    Stroke Father    Hypertension Father    Healthy Child    Cancer Other        brain   Diabetes Other      Current Outpatient Medications:    ALPRAZolam  (XANAX ) 0.25 MG tablet, Take 1 tablet by mouth twice daily as needed for anxiety, Disp: 60 tablet, Rfl: 2   arformoterol  (BROVANA ) 15  MCG/2ML NEBU, Take 2 mLs (15 mcg total) by nebulization 2 (two) times daily., Disp: 120 mL, Rfl: 6   budesonide  (PULMICORT ) 0.25 MG/2ML nebulizer solution, Take 2 mLs (0.25 mg total) by nebulization in the morning and at bedtime., Disp: 60 mL, Rfl: 11   busPIRone  (BUSPAR ) 7.5 MG tablet, Take 1 tablet (7.5 mg total) by mouth 2 (two) times daily., Disp: 60 tablet, Rfl: 2   clopidogrel  (PLAVIX ) 75 MG tablet, Take 1 tablet by mouth once daily, Disp: 90 tablet, Rfl: 0   cyanocobalamin  (VITAMIN B12) 1000 MCG/ML injection, INJECT 1 ML (CC) INTRAMUSCULARLY ONCE A WEEK (Patient taking differently: Inject 100 mcg into the muscle every 30 (thirty) days.), Disp: 10 mL, Rfl: 0   cyclobenzaprine  (FLEXERIL ) 10 MG tablet, TAKE 1 TABLET BY MOUTH THREE TIMES DAILY AS NEEDED FOR MUSCLE SPASMS, Disp: 90 tablet, Rfl: 0   doxycycline  (VIBRA -TABS) 100 MG tablet, Take 1 tablet (100 mg total) by mouth 2 (two) times daily., Disp: 20 tablet, Rfl: 0   DULoxetine  (CYMBALTA ) 60 MG capsule, Take 1 capsule (60 mg total) by mouth 2 (two) times daily., Disp: 180 capsule, Rfl: 0   Dupilumab  (DUPIXENT ) 300 MG/2ML SOAJ, Inject 300 mg into the skin every 14 (fourteen) days., Disp: 12 mL, Rfl: 1   fenofibrate  160 MG tablet, Take 1 tablet by mouth once daily, Disp: 90 tablet, Rfl: 0   finasteride  (PROSCAR ) 5 MG tablet, Take 1 tablet by mouth once daily, Disp: 90 tablet, Rfl: 0   furosemide  (LASIX ) 20 MG tablet, Take 1 tablet (20 mg total) by mouth daily., Disp: 90 tablet, Rfl: 1   gabapentin  (NEURONTIN ) 400 MG capsule, TAKE 1 CAPSULE BY MOUTH THREE TIMES DAILY, Disp: 270 capsule, Rfl: 1   insulin  lispro (HUMALOG ) 100 UNIT/ML injection, Inject 0.04 mLs (4 Units total) into the skin 3 (three) times daily with meals., Disp: 10 mL, Rfl: 11   Insulin  Pen Needle (PEN NEEDLES) 31G X 8 MM MISC, Inject insulin  as directed, Disp: 100 each, Rfl: 1   Insulin  Syringe-Needle U-100 31G X 5/16" 0.3 ML MISC, Inject insulin  as directed, Disp: 100 each, Rfl:  0   ipratropium-albuterol  (DUONEB) 0.5-2.5 (3) MG/3ML SOLN, USE 1 AMPULE IN NEBULIZER 4 TIMES DAILY, Disp: 360 mL, Rfl: 0   Lancets (ONETOUCH DELICA PLUS LANCET33G) MISC, USE 1  TO CHECK GLUCOSE THREE TIMES DAILY (Patient taking differently: 1 each by Other route 3 (three) times daily.), Disp: 100 each, Rfl: 0   levothyroxine  (SYNTHROID ) 25 MCG tablet, Take 1 tablet by mouth once daily, Disp: 90 tablet, Rfl: 0   losartan  (COZAAR ) 50 MG tablet, Take 1 tablet (50 mg total) by mouth daily., Disp: 90 tablet, Rfl: 2   Magnesium  Chloride (MAG64) 64 MG TBEC, Take 1 tablet (64 mg total)  by mouth daily., Disp: 90 tablet, Rfl: 1   metFORMIN  (GLUCOPHAGE ) 500 MG tablet, Take 2 tablets (1,000 mg total) by mouth 2 (two) times daily with a meal., Disp: , Rfl:    nitroGLYCERIN  (NITROSTAT ) 0.4 MG SL tablet, Place 1 tablet (0.4 mg total) under the tongue every 5 (five) minutes as needed for chest pain. DISSOLVE ONE TABLET UNDER THE TONGUE EVERY 5 TO 10 MINUTES PRIOR TO ACTIVITIES WHICH MIGHT PRECIPITATE AN ATTACK, Disp: 25 tablet, Rfl: 0   omega-3 acid ethyl esters (LOVAZA ) 1 g capsule, Take 2 capsules (2 g total) by mouth 2 (two) times daily., Disp: 360 capsule, Rfl: 0   Omega-3 Fatty Acids (FISH OIL ) 1000 MG CAPS, Take 2 capsules (2,000 mg total) by mouth in the morning and at bedtime., Disp: 180 capsule, Rfl: 12   ONETOUCH ULTRA test strip, USE 1 STRIP TO CHECK GLUCOSE IN THE MORNING AND 1 AT BEDTIME, Disp: 100 each, Rfl: 0   oxyCODONE -acetaminophen  (PERCOCET/ROXICET) 5-325 MG tablet, Take 1 tablet by mouth 2 (two) times daily as needed for severe pain (pain score 7-10)., Disp: 60 tablet, Rfl: 0   pantoprazole  (PROTONIX ) 40 MG tablet, Take 1 tablet (40 mg total) by mouth daily., Disp: 90 tablet, Rfl: 0   predniSONE  (DELTASONE ) 10 MG tablet, Take 1 tablet by mouth once daily with breakfast, Disp: 30 tablet, Rfl: 2   predniSONE  (DELTASONE ) 20 MG tablet, Take 3 tablets (60 mg total) by mouth daily with breakfast for 3  days, THEN 2 tablets (40 mg total) daily with breakfast for 3 days, THEN 1 tablet (20 mg total) daily with breakfast for 3 days., Disp: 18 tablet, Rfl: 0   revefenacin  (YUPELRI ) 175 MCG/3ML nebulizer solution, Take 3 mLs (175 mcg total) by nebulization daily., Disp: 90 mL, Rfl: 11   rosuvastatin  (CRESTOR ) 20 MG tablet, Take 1 tablet (20 mg total) by mouth daily., Disp: 90 tablet, Rfl: 3   sodium chloride  HYPERTONIC 3 % nebulizer solution, 3 ml twice daily via nebulizer, Disp: 180 mL, Rfl: 11   spironolactone  (ALDACTONE ) 25 MG tablet, Take 1 tablet (25 mg total) by mouth daily., Disp: 90 tablet, Rfl: 3   tamsulosin  (FLOMAX ) 0.4 MG CAPS capsule, TAKE 2 CAPSULES BY MOUTH ONCE DAILY AFTER SUPPER, Disp: 180 capsule, Rfl: 0   torsemide  (DEMADEX ) 20 MG tablet, Take 1 tablet (20 mg total) by mouth as needed (edema)., Disp: 30 tablet, Rfl: 0   TOUJEO  SOLOSTAR 300 UNIT/ML Solostar Pen, Inject 40 Units into the skin daily. Inject 40 units daily, increase by 2 units every 3 days until sugars are constantly 140 or lower. Max dose of 60 units daily., Disp: 18 mL, Rfl: 0   traZODone  (DESYREL ) 150 MG tablet, Take 1 tablet by mouth once daily, Disp: 270 tablet, Rfl: 0   VENTOLIN  HFA 108 (90 Base) MCG/ACT inhaler, INHALE 2 PUFFS BY MOUTH EVERY 6 HOURS AS NEEDED FOR SHORTNESS OF BREATH FOR WHEEZING (Patient taking differently: Inhale 2 puffs into the lungs every 6 (six) hours as needed for shortness of breath or wheezing.), Disp: 18 g, Rfl: 0   Vitamin D , Ergocalciferol , (DRISDOL ) 1.25 MG (50000 UNIT) CAPS capsule, TAKE 1 CAPSULE BY MOUTH TWICE A WEEK, Disp: 12 capsule, Rfl: 3   metoprolol  succinate (TOPROL -XL) 100 MG 24 hr tablet, Take 1 tablet (100 mg total) by mouth daily. Take with or immediately following a meal., Disp: 90 tablet, Rfl: 2  Current Facility-Administered Medications:    ipratropium-albuterol  (DUONEB) 0.5-2.5 (3) MG/3ML nebulizer solution 3  mL, 3 mL, Nebulization, Once,       Objective:   Vitals:    03/26/24 1410 03/26/24 1411  BP:  135/61  Pulse: 84   SpO2: 94%   Weight:  197 lb (89.4 kg)  Height:  5\' 10"  (1.778 m)    Estimated body mass index is 28.27 kg/m as calculated from the following:   Height as of this encounter: 5\' 10"  (1.778 m).   Weight as of this encounter: 197 lb (89.4 kg).  @WEIGHTCHANGE @  Filed Weights   03/26/24 1411  Weight: 197 lb (89.4 kg)     Physical Exam   General: No distress. Looks wel O2 at rest: no Cane present: no Sitting in wheel chair: no Frail: no Obese: no Neuro: Alert and Oriented x 3. GCS 15. Speech normal Psych: Pleasant Resp:  Barrel Chest - YES  Wheeze - no, Crackles - no, No overt respiratory distress CVS: Normal heart sounds. Murmurs - no Ext: Stigmata of Connective Tissue Disease - no HEENT: Normal upper airway. PEERL +. No post nasal drip        Assessment:       ICD-10-CM   1. Stage 3 severe COPD by GOLD classification (HCC)  J44.9     2. Chronic cough  R05.3     3. Chronic sinusitis, unspecified location  J32.9     4. Tobacco use disorder  F17.200          Plan:     Patient Instructions     Stage 3 severe COPD by GOLD classification (HCC) COPD, frequent exacerbations (HCC) Severe chronic cough with chronic bronchitis   Plan  - Continue Yupelri , Brovana , DuoNeb and Dupixent  as before -Stop fish oil  to see if you might have any silent acid reflux it is negatively impacting her lung  -= Continue chronic daily prednisone   #Chronic cough due to chronic bronchitis/COPD and also laryngopharyngeal reflux diagnosed by Dr. Donalee Fruits in April 2024  Plan  - -  TOO BAD  OTHUVAYRE TOO EXPENSIVE and not started - OTC MULLEIN from [facility]  - 3 mL of 3% hypertonic saline nebulizer twice daily for palliative relief of cough in the setting of chronic bronchitis [handwritten prescription given at patient request] - If these strategies do not work we will consider Hycodan or Tussionex   Tobacco abuse  disorder   -Smoking remains a risk factor for frequent exacerbations and other lung diseases and heart disease  Plan -Quit smoking  Chronic sinusitis, unspecified location  - April 2024 ENT evaluation by Dr. Mariella Shore was considered benign.  Plan - According to ENT.    Follow up  Nurse practitioner in 12 weeks   FOLLOWUP Return in about 3 months (around 06/26/2024) for with any of the APPS, Face to Face Visit.    SIGNATURE    Dr. Maire Scot, M.D., F.C.C.P,  Pulmonary and Critical Care Medicine Staff Physician, [facility] Center Director - Interstitial Lung Disease  Program  Pulmonary Fibrosis [facility] at [facility] [place], [place], [address]  Pager: [phone], If no answer or between  15:00h - 7:00h: call [phone] Telephone: [phone]  2:52 PM 03/26/2024

## 2024-04-01 ENCOUNTER — Other Ambulatory Visit: Payer: Self-pay | Admitting: Family Medicine

## 2024-04-01 DIAGNOSIS — J418 Mixed simple and mucopurulent chronic bronchitis: Secondary | ICD-10-CM

## 2024-04-02 ENCOUNTER — Other Ambulatory Visit: Payer: Self-pay

## 2024-04-02 ENCOUNTER — Ambulatory Visit (HOSPITAL_BASED_OUTPATIENT_CLINIC_OR_DEPARTMENT_OTHER)
Admission: RE | Admit: 2024-04-02 | Discharge: 2024-04-02 | Disposition: A | Discharge status: Home / Self Care | Source: Ambulatory Visit | Attending: Family Medicine | Admitting: Family Medicine

## 2024-04-02 ENCOUNTER — Encounter: Payer: Self-pay | Admitting: Internal Medicine

## 2024-04-02 ENCOUNTER — Ambulatory Visit (INDEPENDENT_AMBULATORY_CARE_PROVIDER_SITE_OTHER)
Admission: RE | Admit: 2024-04-02 | Discharge: 2024-04-02 | Disposition: A | Discharge status: Home / Self Care | Source: Ambulatory Visit | Attending: Family Medicine | Admitting: Family Medicine

## 2024-04-02 DIAGNOSIS — I700 Atherosclerosis of aorta: Secondary | ICD-10-CM | POA: Diagnosis not present

## 2024-04-02 DIAGNOSIS — Z7952 Long term (current) use of systemic steroids: Secondary | ICD-10-CM | POA: Diagnosis not present

## 2024-04-02 DIAGNOSIS — M810 Age-related osteoporosis without current pathological fracture: Secondary | ICD-10-CM

## 2024-04-02 DIAGNOSIS — Z13820 Encounter for screening for osteoporosis: Secondary | ICD-10-CM | POA: Diagnosis not present

## 2024-04-02 DIAGNOSIS — R918 Other nonspecific abnormal finding of lung field: Secondary | ICD-10-CM | POA: Diagnosis not present

## 2024-04-02 DIAGNOSIS — J439 Emphysema, unspecified: Secondary | ICD-10-CM | POA: Diagnosis not present

## 2024-04-02 DIAGNOSIS — M47816 Spondylosis without myelopathy or radiculopathy, lumbar region: Secondary | ICD-10-CM | POA: Diagnosis not present

## 2024-04-02 DIAGNOSIS — R911 Solitary pulmonary nodule: Secondary | ICD-10-CM | POA: Diagnosis not present

## 2024-04-03 ENCOUNTER — Encounter: Payer: Self-pay | Admitting: Family Medicine

## 2024-04-03 ENCOUNTER — Other Ambulatory Visit: Payer: Self-pay | Admitting: Physician Assistant

## 2024-04-03 ENCOUNTER — Ambulatory Visit: Payer: Self-pay | Admitting: Family Medicine

## 2024-04-03 MED ORDER — OXYCODONE-ACETAMINOPHEN 5-325 MG PO TABS: 1.0000 | ORAL_TABLET | Freq: Two times a day (BID) | ORAL | 0 refills | Status: DC | PRN

## 2024-04-03 NOTE — Telephone Encounter (Signed)
**Note De-identified  [place] Obfuscation** Please advise 

## 2024-04-03 NOTE — Telephone Encounter (Signed)
 Ok  so I understadn correctly  A) the hyperteonic asline neb is not helping? B) did they try the OTC [place]? Is that the helping? C) did they stop fish oil ?  Thanks    SIGNATURE    Dr. Maire Scot, M.D., F.C.C.P,  Pulmonary and Critical Care Medicine Staff Physician, [facility] Center Director - Interstitial Lung Disease  Program  Pulmonary Fibrosis [facility] at [facility] [place], [place], [address]   Pager: [phone], If no answer  -> Check [facility] or Try [phone] Telephone (clinical office): [phone] Telephone (research): [phone]  10:32 AM 04/03/2024

## 2024-04-04 ENCOUNTER — Telehealth (INDEPENDENT_AMBULATORY_CARE_PROVIDER_SITE_OTHER): Admitting: Family Medicine

## 2024-04-04 ENCOUNTER — Encounter: Payer: Self-pay | Admitting: Family Medicine

## 2024-04-04 ENCOUNTER — Other Ambulatory Visit: Payer: Self-pay | Admitting: Physician Assistant

## 2024-04-04 DIAGNOSIS — J441 Chronic obstructive pulmonary disease with (acute) exacerbation: Secondary | ICD-10-CM | POA: Diagnosis not present

## 2024-04-04 MED ORDER — PREDNISONE 20 MG PO TABS: ORAL_TABLET | ORAL | 0 refills | Status: AC

## 2024-04-04 MED ORDER — DOXYCYCLINE HYCLATE 100 MG PO TABS
100.0000 mg | ORAL_TABLET | Freq: Two times a day (BID) | ORAL | 0 refills | Status: DC
Start: 2024-04-04 — End: 2024-05-26

## 2024-04-04 MED ORDER — HYDROCODONE BIT-HOMATROP MBR 5-1.5 MG/5ML PO SOLN: 5.0000 mL | Freq: Four times a day (QID) | ORAL | 0 refills | Status: DC | PRN

## 2024-04-04 NOTE — Assessment & Plan Note (Signed)
 COPD exacerbation Acute exacerbation with dyspnea and wheezing. Productive cough with yellow or green sputum. Recently treated at the beginning of the month. Has a pulmonologist who manages condition. (Due to the long weekend the patient did not want this to get any worse like it has in the past. Currently no response from specialist yet.) - Prescribed doxycycline  100 mg orally twice daily for 10 days. - Prescribed Hydromet 5 mL orally every six hours for cough. - Prednisone  (taper) 20 mg tablets - Take 3 pills daily in am with food x 3 days, then 2 pills daily in am with food x 3 days, then 1 pill daily in am with food x 3 days, then stop.

## 2024-04-04 NOTE — Progress Notes (Signed)
 Virtual Visit via Video Note   This visit type was conducted per patient request This format is felt to be appropriate for this patient at this time.  All issues noted in this document were discussed and addressed.  A limited physical exam was performed with this format.  A verbal consent was obtained for the virtual visit.   Date:  04/04/2024   ID:  Don [name], DOB [birth year], MRN [id number]  Patient Location: Home Provider Location: Office/Clinic  PCP:  Mercy Stall, MD   No chief complaint on file.    History of Present Illness:    The patient does have symptoms concerning for COVID-19 infection (fever, chills, cough, or new shortness of breath).   Discussed the use of AI scribe software for clinical note transcription with the patient, who gave verbal consent to proceed.  History of Present Illness   Don [name] is a 73 year old male with COPD who presents with COPD flare up with symptoms of shortness of breath and wheezing.  He has been experiencing shortness of breath and wheezing without fever or chills. He notes some sweating, which he considers normal, and denies chest pain. He uses Duoneb for symptom relief.  His caregiver has tried all of his breathing treatments and non of them seem to be helping. He has taken Pulmicort  (duoneb) TWICE A DAY and Ventolin  2 puffs every 6 hours as prescribed and it hasn't helped. Patient recently had a flare up and was seen by his PCP on 03/18/24 and given doxycycline  along with prednisone  5 mg daily along with his O2 which he uses when his oxygen  saturations drop below 90.   He is currently producing a productive cough with yellow sputum. He denies having a sore throat or rash. He has not been in contact with anyone that is sick and has not been anywhere recently around lots of people. There are no gastrointestinal symptoms such as nausea, vomiting, diarrhea, or constipation.  Due to the long weekend the patient did not want this  to get any worse like it has in the past. Currently no response from his pulmonologist yet.      Past Medical History:  Diagnosis Date   Absence of bladder continence 01/08/2022   Acute cough 07/07/2023   Acute infection of nasal sinus 01/13/2023   Anxiety state 02/02/2022   Arthritis    Asymptomatic LV dysfunction 10/18/2017   Atherosclerosis of aorta (HCC) 01/13/2023   Atypical chest pain 11/09/2022   B12 deficiency anemia 08/25/2021   Basal cell carcinoma    Benign prostatic hyperplasia with incomplete bladder emptying 01/14/2019   Bone pain 03/08/2020   Cancer (HCC)    throat - 1997, throat - 2018   Cardiomyopathy, secondary (HCC) 12/01/2016   Overview:  EF 47% 12/26/16   Chest pain syndrome 11/02/2017   Chronic coronary artery disease 10/18/2017   Chronic ischemic heart disease 11/13/1998   Jan 22, 2003 Entered By: Lonell Rives J Comment:  massive Mi in 2000 per patient hsitory, 2nd Mi in 2001Mar 11, 2004 Entered By: Lonell Rives J Comment: Had stent placedin 2000,cath/angio in 2001   Chronic neck pain 05/16/2022   Chronic pain syndrome 06/04/2022   Chronic respiratory failure with hypoxia (HCC) 05/16/2022   CKD (chronic kidney disease) stage 3, GFR 30-59 ml/min (HCC) 07/25/2019   COPD (chronic obstructive pulmonary disease) (HCC)    COPD exacerbation (HCC) 07/30/2023   Coronary artery disease involving native coronary artery of native heart with angina pectoris (HCC)  12/01/2016   Overview:  He has hx of IWMI in remote past, last cath in 2012 at [facility] showed chronic total occlusion of previously stented RCA, with good collaterals, a 40% LAD stenosis, inferior hypokinesis and EF 45%.    He's been lost to Cardiology f/u since 2015, but has not had any recurrent events   Deficiency anemia 08/25/2021   Diabetic polyneuropathy associated with type 2 diabetes mellitus (HCC) 05/16/2022   Dyslipidemia 12/01/2016   Encounter for Medicare annual wellness exam 01/13/2023   Erectile  dysfunction due to diseases classified elsewhere 06/12/2019   GERD (gastroesophageal reflux disease)    Hallucination 10/08/2022   Hyperlipidemia    Hypertension    Hypertensive heart disease with heart failure (HCC)    Insomnia 01/28/2021   Iron  deficiency 07/07/2023   Iron  deficiency anemia due to chronic blood loss 08/25/2021   Lesion of vocal cord    Leukoplakia of larynx 11/15/2016   Long-term use of aspirin therapy 10/02/2018   Lumbar back pain 06/04/2022   Lung nodules 08/17/2022   Malfunction of penile prosthesis (HCC) 01/14/2019   Malignant neoplasm of skin 01/28/2021   Formatting of this note might be different from the original. Jan 22, 2003 Entered By: Lonell Rives J Comment: of skin - removed x2 inpast Jan 22, 2003 Entered By: Lonell Rives J Comment: of skin - removed x2 inpast   Melanoma of back (HCC)    melanoma on back   Memory loss 03/08/2020   Moderate recurrent major depression (HCC) 03/02/2023   Mucopurulent chronic bronchitis (HCC) 04/08/2023   Other ill-defined and unknown causes of morbidity and mortality 11/13/1993   Formatting of this note might be different from the original. Jan 31, 2008 Entered By: ARMOUR,ROSS B Comment: lumbar, 8/08 Jan 22, 2003 Entered By: Lonell Rives J Comment: x77yrs in work place  quit 35yrs ago in 2000 Jan 22, 2003 Entered By: Lonell Rives J Comment: x31yrs in work place  quit 23yrs ago in 2000 Jan 31, 2008 Entered By: Dane Dung B Comment: lumbar, 8/08   PAD (peripheral artery disease) (HCC) 10/18/2017   Peripheral vascular disease (HCC)    iliac artery clot   Peyronie's disease 06/12/2019   PVC (premature ventricular contraction) 10/09/2023   RBBB 10/09/2023   Squamous cell carcinoma of larynx (HCC) 12/13/2016   Throat cancer (HCC)    Tobacco use disorder 06/12/2019   Type 2 diabetes mellitus with hyperglycemia, with long-term current use of insulin  (HCC) 07/30/2023   URI, acute 05/31/2023   Urinary urgency 04/08/2023    Ventricular ectopy 11/09/2022   Vertigo 04/08/2023   Vitamin D  deficiency 01/13/2023    Past Surgical History:  Procedure Laterality Date   ABDOMINAL ANGIOGRAM N/A 01/13/2015   Procedure: ABDOMINAL ANGIOGRAM;  Surgeon: Mayo Speck, MD;  Location: [facility] CATH LAB;  Service: Cardiovascular;  Laterality: N/A;   APPENDECTOMY     BACK SURGERY     CARDIAC CATHETERIZATION  2000/2012   with stents in 2000   CHOLECYSTECTOMY     COLONOSCOPY     ELBOW SURGERY     bilateral   ESOPHAGOGASTRODUODENOSCOPY     KNEE SURGERY Left    MICROLARYNGOSCOPY WITH CO2 LASER AND EXCISION OF VOCAL CORD LESION N/A 11/23/2016   Procedure: MICROLARYNGOSCOPY  AND EXCISION OF VOCAL CORD LESION;  Surgeon: Vernadine Golas, MD;  Location: [facility] OR;  Service: ENT;  Laterality: N/A;   MICROLARYNGOSCOPY WITH CO2 LASER AND EXCISION OF VOCAL CORD LESION N/A 01/11/2017   Procedure: MICROLARYNGOSCOPY WITH CO2 LASER AND EXCISION OF  VOCAL CORD LESION;  Surgeon: Vernadine Golas, MD;  Location: [facility] OR;  Service: ENT;  Laterality: N/A;   MICROLARYNGOSCOPY WITH LASER N/A 03/16/2015   Procedure: MICROLARYNGOSCOPY ;  Surgeon: Vernadine Golas, MD;  Location: [facility] OR;  Service: ENT;  Laterality: N/A;   peyronie's surgery     SHOULDER SURGERY Right    rotator cuff   SPINE SURGERY  09/2020   cervical surgery. steel plate, bone spurs, allograft.   THROAT SURGERY  1997   cancer removed    Family History  Problem Relation Age of Onset   Cancer Mother        bone   Hypertension Mother    Stroke Father    Hypertension Father    Healthy Child    Cancer Other        brain   Diabetes Other     Social History   Socioeconomic History   Marital status: Married    Spouse name: Not on file   Number of children: 4   Years of education: Not on file   Highest education level: GED or equivalent  Occupational History   Occupation: retired  Tobacco Use   Smoking status: Every Day    Current packs/day: 1.00    Average packs/day: 1 pack/day for 50.0 years (50.0  ttl pk-yrs)    Types: Cigarettes   Smokeless tobacco: Never   Tobacco comments:    down to .5 ppd  11/02/2022 hfb  Vaping Use   Vaping status: Former  Substance and Sexual Activity   Alcohol use: Not on file   Drug use: No   Sexual activity: Not on file  Other Topics Concern   Not on file  Social History Narrative   Lives with wife       One level      Right hand   Social Drivers of Health   Financial Resource Strain: Low Risk  (11/12/2023)   Overall Financial Resource Strain (CARDIA)    Difficulty of Paying Living Expenses: Not hard at all  Food Insecurity: No Food Insecurity (11/12/2023)   Hunger Vital Sign    Worried About Running Out of Food in the Last Year: Never true    Ran Out of Food in the Last Year: Never true  Transportation Needs: No Transportation Needs (11/12/2023)   PRAPARE - Administrator, Civil Service (Medical): No    Lack of Transportation (Non-Medical): No  Physical Activity: Inactive (11/12/2023)   Exercise Vital Sign    Days of Exercise per Week: 0 days    Minutes of Exercise per Session: 30 min  Stress: No Stress Concern Present (11/12/2023)   [employer] of Occupational Health - Occupational Stress Questionnaire    Feeling of Stress : Only a little  Social Connections: Socially Isolated (11/12/2023)   Social Connection and Isolation Panel [NHANES]    Frequency of Communication with Friends and Family: Once a week    Frequency of Social Gatherings with Friends and Family: Never    Attends Religious Services: Never    Database administrator or Organizations: No    Attends Engineer, structural: Not on file    Marital Status: Married  Catering manager Violence: Not At Risk (01/11/2023)   Humiliation, Afraid, Rape, and Kick questionnaire    Fear of Current or Ex-Partner: No    Emotionally Abused: No    Physically Abused: No    Sexually Abused: No    Outpatient Medications Prior to Visit  Medication Sig Dispense  Refill   ALPRAZolam  (XANAX ) 0.25 MG tablet Take 1 tablet by mouth twice daily as needed for anxiety 60 tablet 2   arformoterol  (BROVANA ) 15 MCG/2ML NEBU Take 2 mLs (15 mcg total) by nebulization 2 (two) times daily. 120 mL 6   budesonide  (PULMICORT ) 0.25 MG/2ML nebulizer solution Take 2 mLs (0.25 mg total) by nebulization in the morning and at bedtime. 60 mL 11   busPIRone  (BUSPAR ) 7.5 MG tablet Take 1 tablet (7.5 mg total) by mouth 2 (two) times daily. 60 tablet 2   clopidogrel  (PLAVIX ) 75 MG tablet Take 1 tablet by mouth once daily 90 tablet 0   cyanocobalamin  (VITAMIN B12) 1000 MCG/ML injection INJECT 1 ML (CC) INTRAMUSCULARLY ONCE A WEEK (Patient taking differently: Inject 100 mcg into the muscle every 30 (thirty) days.) 10 mL 0   cyclobenzaprine  (FLEXERIL ) 10 MG tablet TAKE 1 TABLET BY MOUTH THREE TIMES DAILY AS NEEDED FOR MUSCLE SPASMS 90 tablet 0   DULoxetine  (CYMBALTA ) 60 MG capsule Take 1 capsule (60 mg total) by mouth 2 (two) times daily. 180 capsule 0   Dupilumab  (DUPIXENT ) 300 MG/2ML SOAJ Inject 300 mg into the skin every 14 (fourteen) days. 12 mL 1   fenofibrate  160 MG tablet Take 1 tablet by mouth once daily 90 tablet 0   finasteride  (PROSCAR ) 5 MG tablet Take 1 tablet by mouth once daily 90 tablet 0   furosemide  (LASIX ) 20 MG tablet Take 1 tablet (20 mg total) by mouth daily. 90 tablet 1   gabapentin  (NEURONTIN ) 400 MG capsule TAKE 1 CAPSULE BY MOUTH THREE TIMES DAILY 270 capsule 1   insulin  lispro (HUMALOG ) 100 UNIT/ML injection Inject 0.04 mLs (4 Units total) into the skin 3 (three) times daily with meals. 10 mL 11   Insulin  Pen Needle (PEN NEEDLES) 31G X 8 MM MISC Inject insulin  as directed 100 each 1   Insulin  Syringe-Needle U-100 31G X 5/16" 0.3 ML MISC Inject insulin  as directed 100 each 0   ipratropium-albuterol  (DUONEB) 0.5-2.5 (3) MG/3ML SOLN USE 1 VIAL IN NEBULIZER 4 TIMES DAILY 360 mL 0   Lancets (ONETOUCH DELICA PLUS LANCET33G) MISC USE 1  TO CHECK GLUCOSE THREE TIMES  DAILY (Patient taking differently: 1 each by Other route 3 (three) times daily.) 100 each 0   levothyroxine  (SYNTHROID ) 25 MCG tablet Take 1 tablet by mouth once daily 90 tablet 0   losartan  (COZAAR ) 50 MG tablet Take 1 tablet (50 mg total) by mouth daily. 90 tablet 2   Magnesium  Chloride (MAG64) 64 MG TBEC Take 1 tablet (64 mg total) by mouth daily. 90 tablet 1   metFORMIN  (GLUCOPHAGE ) 500 MG tablet Take 2 tablets (1,000 mg total) by mouth 2 (two) times daily with a meal.     metoprolol  succinate (TOPROL -XL) 100 MG 24 hr tablet Take 1 tablet (100 mg total) by mouth daily. Take with or immediately following a meal. 90 tablet 2   nitroGLYCERIN  (NITROSTAT ) 0.4 MG SL tablet Place 1 tablet (0.4 mg total) under the tongue every 5 (five) minutes as needed for chest pain. DISSOLVE ONE TABLET UNDER THE TONGUE EVERY 5 TO 10 MINUTES PRIOR TO ACTIVITIES WHICH MIGHT PRECIPITATE AN ATTACK 25 tablet 0   omega-3 acid ethyl esters (LOVAZA ) 1 g capsule Take 2 capsules (2 g total) by mouth 2 (two) times daily. 360 capsule 0   Omega-3 Fatty Acids (FISH OIL ) 1000 MG CAPS Take 2 capsules (2,000 mg total) by mouth in the morning and at bedtime.  180 capsule 12   ONETOUCH ULTRA test strip USE 1 STRIP TO CHECK GLUCOSE IN THE MORNING AND 1 AT BEDTIME 100 each 0   oxyCODONE -acetaminophen  (PERCOCET/ROXICET) 5-325 MG tablet Take 1 tablet by mouth 2 (two) times daily as needed for severe pain (pain score 7-10). 60 tablet 0   pantoprazole  (PROTONIX ) 40 MG tablet Take 1 tablet (40 mg total) by mouth daily. 90 tablet 0   predniSONE  (DELTASONE ) 10 MG tablet Take 1 tablet by mouth once daily with breakfast 30 tablet 2   revefenacin  (YUPELRI ) 175 MCG/3ML nebulizer solution Take 3 mLs (175 mcg total) by nebulization daily. 90 mL 11   rosuvastatin  (CRESTOR ) 20 MG tablet Take 1 tablet (20 mg total) by mouth daily. 90 tablet 3   sodium chloride  HYPERTONIC 3 % nebulizer solution 3 ml twice daily via nebulizer 180 mL 11   spironolactone   (ALDACTONE ) 25 MG tablet Take 1 tablet (25 mg total) by mouth daily. 90 tablet 3   tamsulosin  (FLOMAX ) 0.4 MG CAPS capsule TAKE 2 CAPSULES BY MOUTH ONCE DAILY AFTER SUPPER 180 capsule 0   torsemide  (DEMADEX ) 20 MG tablet Take 1 tablet (20 mg total) by mouth as needed (edema). 30 tablet 0   TOUJEO  SOLOSTAR 300 UNIT/ML Solostar Pen Inject 40 Units into the skin daily. Inject 40 units daily, increase by 2 units every 3 days until sugars are constantly 140 or lower. Max dose of 60 units daily. 18 mL 0   traZODone  (DESYREL ) 150 MG tablet Take 1 tablet by mouth once daily 270 tablet 0   VENTOLIN  HFA 108 (90 Base) MCG/ACT inhaler INHALE 2 PUFFS BY MOUTH EVERY 6 HOURS AS NEEDED FOR SHORTNESS OF BREATH FOR WHEEZING (Patient taking differently: Inhale 2 puffs into the lungs every 6 (six) hours as needed for shortness of breath or wheezing.) 18 g 0   Vitamin D , Ergocalciferol , (DRISDOL ) 1.25 MG (50000 UNIT) CAPS capsule TAKE 1 CAPSULE BY MOUTH TWICE A WEEK 12 capsule 3   doxycycline  (VIBRA -TABS) 100 MG tablet Take 1 tablet (100 mg total) by mouth 2 (two) times daily. 20 tablet 0   Facility-Administered Medications Prior to Visit  Medication Dose Route Frequency Provider Last Rate Last Admin   ipratropium-albuterol  (DUONEB) 0.5-2.5 (3) MG/3ML nebulizer solution 3 mL  3 mL Nebulization Once         Allergies  Allergen Reactions   Penicillins Rash and Hives    Has patient had a PCN reaction causing immediate rash, facial/tongue/throat swelling, SOB or lightheadedness with hypotension:unsure Has patient had a PCN reaction causing severe rash involving mucus membranes or skin necrosis:unsure Has patient had a PCN reaction that required hospitalization:No Has patient had a PCN reaction occurring within the last 10 years:No If all of the above answers are "NO", then may proceed with Cephalosporin use.  rash   Bactrim  [Sulfamethoxazole -Trimethoprim ]     Dizziness, confusion, involuntary movements   Iodinated  Contrast Media Rash    Also developed blisters   Penicillin G Rash   Tramadol Rash     Social History   Tobacco Use   Smoking status: Every Day    Current packs/day: 1.00    Average packs/day: 1 pack/day for 50.0 years (50.0 ttl pk-yrs)    Types: Cigarettes   Smokeless tobacco: Never   Tobacco comments:    down to .5 ppd  11/02/2022 hfb  Vaping Use   Vaping status: Former  Substance Use Topics   Drug use: No     Review of Systems  Constitutional:  Positive for diaphoresis. Negative for chills and fever.  HENT:  Negative for sore throat.   Respiratory:  Positive for cough (productive - yellow), shortness of breath and wheezing.   Cardiovascular:  Negative for chest pain.  Gastrointestinal: Negative.   Skin:  Negative for rash.     Labs/Other Tests and Data Reviewed:    Recent Labs: 05/31/2023: Magnesium  1.6 07/06/2023: NT-Pro BNP 665 02/28/2024: ALT 21; BUN 19; Creatinine, Ser 1.10; Hemoglobin 14.9; Platelets 195; Potassium 4.6; Sodium 134; TSH 2.280   Recent Lipid Panel Lab Results  Component Value Date/Time   CHOL 120 02/28/2024 10:00 AM   TRIG 517 (H) 02/28/2024 10:00 AM   HDL 27 (L) 02/28/2024 10:00 AM   CHOLHDL 4.4 02/28/2024 10:00 AM   LDLCALC 23 02/28/2024 10:00 AM    Wt Readings from Last 3 Encounters:  03/26/24 197 lb (89.4 kg)  02/28/24 201 lb (91.2 kg)  01/08/24 198 lb (89.8 kg)     Objective:    Vital Signs:  There were no vitals taken for this visit.   Physical Exam Constitutional:      General: He is not in acute distress.    Appearance: He is ill-appearing.  Pulmonary:     Effort: No respiratory distress.     Comments: Video visit - could hear congestion with cough Neurological:     Mental Status: He is alert. Mental status is at baseline.  Psychiatric:        Mood and Affect: Mood normal.      ASSESSMENT & PLAN:   COPD exacerbation (HCC) Assessment & Plan: COPD exacerbation Acute exacerbation with dyspnea and wheezing. Productive  cough with yellow or green sputum. Recently treated at the beginning of the month. Has a pulmonologist who manages condition. (Due to the long weekend the patient did not want this to get any worse like it has in the past. Currently no response from specialist yet.) - Prescribed doxycycline  100 mg orally twice daily for 10 days. - Prescribed Hydromet 5 mL orally every six hours for cough. - Prednisone  (taper) 20 mg tablets - Take 3 pills daily in am with food x 3 days, then 2 pills daily in am with food x 3 days, then 1 pill daily in am with food x 3 days, then stop.   Orders: -     Doxycycline  Hyclate; Take 1 tablet (100 mg total) by mouth 2 (two) times daily.  Dispense: 20 tablet; Refill: 0 -     predniSONE ; Take 3 tablets (60 mg total) by mouth daily with breakfast for 3 days, THEN 2 tablets (40 mg total) daily with breakfast for 3 days, THEN 1 tablet (20 mg total) daily with breakfast for 3 days.  Dispense: 18 tablet; Refill: 0 -     HYDROcodone  Bit-Homatrop MBr; Take 5 mLs by mouth every 6 (six) hours as needed for cough.  Dispense: 120 mL; Refill: 0     No orders of the defined types were placed in this encounter.    Meds ordered this encounter  Medications   doxycycline  (VIBRA -TABS) 100 MG tablet    Sig: Take 1 tablet (100 mg total) by mouth 2 (two) times daily.    Dispense:  20 tablet    Refill:  0    Has tolerated doxycycline . No longer allergic.   predniSONE  (DELTASONE ) 20 MG tablet    Sig: Take 3 tablets (60 mg total) by mouth daily with breakfast for 3 days, THEN 2 tablets (40 mg  total) daily with breakfast for 3 days, THEN 1 tablet (20 mg total) daily with breakfast for 3 days.    Dispense:  18 tablet    Refill:  0   HYDROcodone  bit-homatropine (HYDROMET) 5-1.5 MG/5ML syrup    Sig: Take 5 mLs by mouth every 6 (six) hours as needed for cough.    Dispense:  120 mL    Refill:  0     Follow Up:  In Person prn  Signed, Delford Felling, FNP [phone]     [facility] [place]

## 2024-04-04 NOTE — Telephone Encounter (Signed)
**Note De-identified  [place] Obfuscation** Please advise 

## 2024-04-08 ENCOUNTER — Encounter: Payer: Self-pay | Admitting: Family Medicine

## 2024-04-08 ENCOUNTER — Telehealth: Payer: Self-pay

## 2024-04-08 NOTE — Telephone Encounter (Signed)
 Pharmacy informed.

## 2024-04-08 NOTE — Telephone Encounter (Signed)
 [name]    See if they are willing to consider hiome pallaitive care for symptom mgmt  Thanks    SIGNATURE    Dr. Maire Scot, M.D., F.C.C.P,  Pulmonary and Critical Care Medicine Staff Physician, [facility] Center Director - Interstitial Lung Disease  Program  Pulmonary Fibrosis [facility] [place], [place], [address]   Pager: [phone], If no answer  -> Check AMION or Try [phone] Telephone (clinical office): [phone] Telephone (research): [phone]  2:33 PM 04/08/2024

## 2024-04-08 NOTE — Telephone Encounter (Signed)
 Copied from CRM [id number]. Topic: Clinical - Medication Question >> Apr 04, 2024  1:44 PM Sophia H wrote: Reason for CRM: Pharmacy just wants to make sure medication HYDROcodone  bit-homatropine (HYDROMET) 5-1.5 MG/5ML syrup Is okay to dispense since pt is also on a percocet. Pt will be going to pharmacy In an hour.  [facility] [address], [place] - [address] >> Apr 04, 2024  9:81 PM Emylou G wrote: Roselyn Connor called checking status.. can the patient take both together? HYDROcodone  bit-homatropine (HYDROMET) 5-1.5 MG/5ML syrup oxyCODONE -acetaminophen  (PERCOCET/ROXICET) 5-325 MG tablet

## 2024-04-09 ENCOUNTER — Telehealth: Payer: Self-pay

## 2024-04-09 NOTE — Telephone Encounter (Signed)
 Palliative care 0s NOT hospice but is supportive care for [facility] mgmt  to give an extra layer of support

## 2024-04-09 NOTE — Telephone Encounter (Signed)
 Copied from CRM [id number]. Topic: Clinical - Lab/Test Results >> Apr 09, 2024 10:31 AM Margarette Shawl wrote: Reason for CRM:   Pt's wife, Murlean Armour, is contacting clinic to speak with Ashlyn. She is extremely concerned about the message left, and is upset that even though palliative care was not mentioned during his previous visits, it is mentioned in the message that was left.   She also reports frustration with the CT results not having returned yet. Reviewed chart and advised results have not come back, that it is currently waiting to be read by radiologist.   Requesting call back as soon as possible for clarification of situation.   # [phone] Murlean Armour)

## 2024-04-09 NOTE — Telephone Encounter (Signed)
Please advises

## 2024-04-09 NOTE — Telephone Encounter (Signed)
 See mychart msg dated 04/02/24

## 2024-04-10 ENCOUNTER — Ambulatory Visit: Admitting: Cardiology

## 2024-04-14 ENCOUNTER — Encounter: Payer: Self-pay | Admitting: Cardiology

## 2024-04-14 ENCOUNTER — Ambulatory Visit: Payer: Self-pay | Admitting: Family Medicine

## 2024-04-14 ENCOUNTER — Encounter: Payer: Self-pay | Admitting: Family Medicine

## 2024-04-15 ENCOUNTER — Other Ambulatory Visit: Payer: Self-pay | Admitting: Family Medicine

## 2024-04-15 DIAGNOSIS — E1165 Type 2 diabetes mellitus with hyperglycemia: Secondary | ICD-10-CM

## 2024-04-15 NOTE — Telephone Encounter (Signed)
 Per Mychart message, pt / relative will advise Dr Bertrum Brodie on their answer regarding palliative care. NFN at this time.

## 2024-04-16 DIAGNOSIS — J449 Chronic obstructive pulmonary disease, unspecified: Secondary | ICD-10-CM | POA: Diagnosis not present

## 2024-04-16 DIAGNOSIS — J9610 Chronic respiratory failure, unspecified whether with hypoxia or hypercapnia: Secondary | ICD-10-CM | POA: Diagnosis not present

## 2024-04-17 ENCOUNTER — Other Ambulatory Visit: Payer: Self-pay | Admitting: Pharmacist

## 2024-04-17 DIAGNOSIS — J441 Chronic obstructive pulmonary disease with (acute) exacerbation: Secondary | ICD-10-CM

## 2024-04-17 DIAGNOSIS — J449 Chronic obstructive pulmonary disease, unspecified: Secondary | ICD-10-CM

## 2024-04-17 MED ORDER — DUPIXENT 300 MG/2ML ~~LOC~~ SOAJ: 300.0000 mg | SUBCUTANEOUS | 1 refills | Status: DC

## 2024-04-17 NOTE — Telephone Encounter (Signed)
 Refill sent for DUPIXENT  to [facility]: [phone]  Dose: 300mg  subcut every 14 days  Last OV: 03/26/2024 Provider: Dr. Bertrum Brodie  Next OV: not scheduled (due in August 2025)  Geraldene Kleine, PharmD, MPH, BCPS Clinical Pharmacist (Rheumatology and Pulmonology)

## 2024-04-22 ENCOUNTER — Ambulatory Visit: Admitting: Cardiology

## 2024-04-24 DIAGNOSIS — J449 Chronic obstructive pulmonary disease, unspecified: Secondary | ICD-10-CM | POA: Insufficient documentation

## 2024-04-24 DIAGNOSIS — I10 Essential (primary) hypertension: Secondary | ICD-10-CM | POA: Insufficient documentation

## 2024-04-24 DIAGNOSIS — J439 Emphysema, unspecified: Secondary | ICD-10-CM | POA: Insufficient documentation

## 2024-04-28 ENCOUNTER — Encounter: Payer: Self-pay | Admitting: Cardiology

## 2024-04-28 ENCOUNTER — Encounter: Payer: Self-pay | Admitting: Family Medicine

## 2024-04-28 ENCOUNTER — Ambulatory Visit

## 2024-04-28 DIAGNOSIS — J441 Chronic obstructive pulmonary disease with (acute) exacerbation: Secondary | ICD-10-CM

## 2024-04-29 ENCOUNTER — Ambulatory Visit

## 2024-04-29 ENCOUNTER — Ambulatory Visit: Payer: Self-pay

## 2024-04-29 ENCOUNTER — Ambulatory Visit (INDEPENDENT_AMBULATORY_CARE_PROVIDER_SITE_OTHER): Admitting: Physician Assistant

## 2024-04-29 VITALS — BP 128/68 | HR 58 | Temp 97.8°F | Ht 70.0 in | Wt 192.0 lb

## 2024-04-29 DIAGNOSIS — R310 Gross hematuria: Secondary | ICD-10-CM | POA: Diagnosis not present

## 2024-04-29 DIAGNOSIS — R300 Dysuria: Secondary | ICD-10-CM | POA: Insufficient documentation

## 2024-04-29 DIAGNOSIS — R079 Chest pain, unspecified: Secondary | ICD-10-CM | POA: Diagnosis not present

## 2024-04-29 DIAGNOSIS — Z794 Long term (current) use of insulin: Secondary | ICD-10-CM | POA: Diagnosis not present

## 2024-04-29 DIAGNOSIS — I252 Old myocardial infarction: Secondary | ICD-10-CM | POA: Diagnosis not present

## 2024-04-29 DIAGNOSIS — R319 Hematuria, unspecified: Secondary | ICD-10-CM | POA: Diagnosis not present

## 2024-04-29 DIAGNOSIS — R3129 Other microscopic hematuria: Secondary | ICD-10-CM | POA: Insufficient documentation

## 2024-04-29 DIAGNOSIS — I4510 Unspecified right bundle-branch block: Secondary | ICD-10-CM | POA: Diagnosis not present

## 2024-04-29 DIAGNOSIS — R002 Palpitations: Secondary | ICD-10-CM | POA: Insufficient documentation

## 2024-04-29 DIAGNOSIS — R9431 Abnormal electrocardiogram [ECG] [EKG]: Secondary | ICD-10-CM | POA: Diagnosis not present

## 2024-04-29 DIAGNOSIS — Z7902 Long term (current) use of antithrombotics/antiplatelets: Secondary | ICD-10-CM | POA: Diagnosis not present

## 2024-04-29 DIAGNOSIS — I110 Hypertensive heart disease with heart failure: Secondary | ICD-10-CM | POA: Diagnosis not present

## 2024-04-29 DIAGNOSIS — R0782 Intercostal pain: Secondary | ICD-10-CM

## 2024-04-29 DIAGNOSIS — E119 Type 2 diabetes mellitus without complications: Secondary | ICD-10-CM | POA: Diagnosis not present

## 2024-04-29 DIAGNOSIS — Z7984 Long term (current) use of oral hypoglycemic drugs: Secondary | ICD-10-CM | POA: Diagnosis not present

## 2024-04-29 DIAGNOSIS — F17210 Nicotine dependence, cigarettes, uncomplicated: Secondary | ICD-10-CM | POA: Diagnosis not present

## 2024-04-29 DIAGNOSIS — Z79899 Other long term (current) drug therapy: Secondary | ICD-10-CM | POA: Diagnosis not present

## 2024-04-29 DIAGNOSIS — I509 Heart failure, unspecified: Secondary | ICD-10-CM | POA: Diagnosis not present

## 2024-04-29 HISTORY — DX: Intercostal pain: R07.82

## 2024-04-29 HISTORY — DX: Other microscopic hematuria: R31.29

## 2024-04-29 HISTORY — DX: Dysuria: R30.0

## 2024-04-29 HISTORY — DX: Palpitations: R00.2

## 2024-04-29 LAB — POCT URINALYSIS DIP (CLINITEK)
Bilirubin, UA: NEGATIVE
Glucose, UA: 500 mg/dL — AB
Ketones, POC UA: NEGATIVE mg/dL
Leukocytes, UA: NEGATIVE
Nitrite, UA: NEGATIVE
POC PROTEIN,UA: NEGATIVE
Spec Grav, UA: 1.01 (ref 1.010–1.025)
Urobilinogen, UA: NEGATIVE U/dL — AB
pH, UA: 6 (ref 5.0–8.0)

## 2024-04-29 NOTE — Assessment & Plan Note (Signed)
 Acute hematuria with differential including nephrolithiasis, UTI, BPH, and possible malignancy. CT showed cysts, no stones. Discussed bladder cancer potential if imaging and culture negative, may need cystoscopy. - Order urine culture and microscopy. - Consider imaging for nephrolithiasis. - Discuss potential cystoscopy if imaging and culture negative.

## 2024-04-29 NOTE — Assessment & Plan Note (Signed)
 Irregular heart rate, PVCs not consistent on EKG. EKG shows ST depression, possible MI. On Plavix , causing bruising. Discussed ventricular arrhythmia potential and need for rhythm correction if frequent PVCs. - Perform EKG. - Send to ER for evaluation with troponins for possible MI. - Consider CT scan for cardiac evaluation. - Discuss potential need for rhythm correction if frequent PVCs.

## 2024-04-29 NOTE — Progress Notes (Signed)
 Acute Office Visit  Subjective:    Patient ID: Don [name], male    DOB: [birth year], 73 y.o.   MRN: [id number]  Chief Complaint  Patient presents with   Blood in urine    HPI: Patient is in today for blood in his urine. Admits to chest two days ago at rest with needing two doses of nitroglycerin  to help with pain.  Discussed the use of AI scribe software for clinical note transcription with the patient, who gave verbal consent to proceed.  History of Present Illness   Don [name] is a 73 year old male with benign prostate hyperplasia who presents with blood in his urine and heart issues.  He has blood in his urine, first noticed today on his underwear. He has a history of benign prostate hyperplasia (BPH) and experiences difficulty urinating, characterized by starting and stopping during urination, and prolonged time to void. No history of kidney stones, but he has bilateral flank pain without a specific side being worse. A CT scan in January showed cysts but no stones.  He reports heart issues, including an irregular heart rate that has been fluctuating. He has been taking nitrates twice daily this week, which have provided some relief. He has a history of premature ventricular contractions (PVCs) that were not evident on recent EKGs but were present during a previous visit to a heart specialist. He has experienced three falls in the past week, one resulting in a lip injury and another causing a bruise on his hip. No blacking out during these falls.  He has a history of fibromyalgia surgery with implants, which have caused persistent groin pain described as 'a needle sticking in me.' He is currently on Plavix , which causes easy bruising, as evidenced by bruises on his arms.       Past Medical History:  Diagnosis Date   Abnormal chest CT 02/01/2024   Absence of bladder continence 01/08/2022   Acquired hypothyroidism 03/02/2024   Acute infection of nasal sinus  01/13/2023   Acute non-recurrent maxillary sinusitis 01/13/2023   Anxiety state 02/02/2022   Arthritis    Asymptomatic LV dysfunction 10/18/2017   Atherosclerosis of aorta (HCC) 01/13/2023   B12 deficiency anemia 08/25/2021   Basal cell carcinoma    Benign prostatic hyperplasia with incomplete bladder emptying 01/14/2019   Cancer (HCC)    throat - 1997, throat - 2018   Cardiomyopathy, secondary (HCC) 12/01/2016   Overview:  EF 47% 12/26/16   Chronic coronary artery disease 10/18/2017   Chronic cough 12/16/2023   Chronic ischemic heart disease 11/13/1998   Jan 22, 2003 Entered By: Lonell Rives J Comment:  massive Mi in 2000 per patient hsitory, 2nd Mi in 2001Mar 11, 2004 Entered By: Lonell Rives J Comment: Had stent placedin 2000,cath/angio in 2001   Chronic neck pain 05/16/2022   Chronic pain syndrome 06/04/2022   Chronic respiratory failure with hypoxia (HCC) 05/16/2022   COPD (chronic obstructive pulmonary disease) (HCC)    COPD exacerbation (HCC) 07/30/2023   Coronary artery disease involving native coronary artery of native heart with angina pectoris (HCC) 12/01/2016   Overview:  He has hx of IWMI in remote past, last cath in 2012 at [facility] showed chronic total occlusion of previously stented RCA, with good collaterals, a 40% LAD stenosis, inferior hypokinesis and EF 45%.    He's been lost to Cardiology f/u since 2015, but has not had any recurrent events   Deficiency anemia 08/25/2021   Diabetic polyneuropathy associated with type 2  diabetes mellitus (HCC) 05/16/2022   Dyslipidemia 12/01/2016   Encounter for Medicare annual wellness exam 01/13/2023   Erectile dysfunction due to diseases classified elsewhere 06/12/2019   GAD (generalized anxiety disorder) 02/02/2022   GERD (gastroesophageal reflux disease)    Hallucination 10/08/2022   Hyperlipidemia    Hypertension    Hypertensive heart disease with heart failure (HCC)    Insomnia 01/28/2021   Iron  deficiency 07/07/2023   Iron   deficiency anemia due to chronic blood loss 08/25/2021   Lesion of vocal cord    Leukoplakia of larynx 11/15/2016   Long term (current) use of systemic steroids 12/16/2023   Long-term use of aspirin therapy 10/02/2018   Lumbar back pain 06/04/2022   Lung nodule 11/30/2023   Lung nodules 08/17/2022   Macular degeneration of both eyes 03/02/2024   Malfunction of penile prosthesis (HCC) 01/14/2019   Malignant neoplasm of skin 01/28/2021   Formatting of this note might be different from the original. Jan 22, 2003 Entered By: Lonell Rives J Comment: of skin - removed x2 inpast Jan 22, 2003 Entered By: Lonell Rives J Comment: of skin - removed x2 inpast   Melanoma of back (HCC)    melanoma on back   Memory loss 03/08/2020   Mixed simple and mucopurulent chronic bronchitis (HCC) 03/02/2024   Moderate recurrent major depression (HCC) 03/02/2023   Mucopurulent chronic bronchitis (HCC) 04/08/2023   Oropharyngeal dysphagia 08/12/2018   Other ill-defined and unknown causes of morbidity and mortality 11/13/1993   Formatting of this note might be different from the original. Jan 31, 2008 Entered By: ARMOUR,ROSS B Comment: lumbar, 8/08 Jan 22, 2003 Entered By: Lonell Rives J Comment: x15yrs in work place  quit 81yrs ago in 2000 Jan 22, 2003 Entered By: Lonell Rives J Comment: x2yrs in work place  quit 27yrs ago in 2000 Jan 31, 2008 Entered By: Dane Dung B Comment: lumbar, 8/08   PAD (peripheral artery disease) (HCC) 10/18/2017   Peripheral vascular disease (HCC)    iliac artery clot   Peyronie's disease 06/12/2019   PVC (premature ventricular contraction) 10/09/2023   RBBB 10/09/2023   Squamous cell carcinoma of larynx (HCC) 12/13/2016   Steroid myopathy 02/01/2024   Throat cancer (HCC)    Tobacco use disorder 06/12/2019   Type 2 diabetes mellitus with hyperglycemia, with long-term current use of insulin  (HCC) 07/30/2023   URI, acute 05/31/2023   Urinary urgency 04/08/2023   Ventricular  ectopy 11/09/2022   Vertigo 04/08/2023   Vitamin D  deficiency disease 01/13/2023    Past Surgical History:  Procedure Laterality Date   ABDOMINAL ANGIOGRAM N/A 01/13/2015   Procedure: ABDOMINAL ANGIOGRAM;  Surgeon: Mayo Speck, MD;  Location: [facility] CATH LAB;  Service: Cardiovascular;  Laterality: N/A;   APPENDECTOMY     BACK SURGERY     CARDIAC CATHETERIZATION  2000/2012   with stents in 2000   CHOLECYSTECTOMY     COLONOSCOPY     ELBOW SURGERY     bilateral   ESOPHAGOGASTRODUODENOSCOPY     KNEE SURGERY Left    MICROLARYNGOSCOPY WITH CO2 LASER AND EXCISION OF VOCAL CORD LESION N/A 11/23/2016   Procedure: MICROLARYNGOSCOPY  AND EXCISION OF VOCAL CORD LESION;  Surgeon: Vernadine Golas, MD;  Location: [facility] OR;  Service: ENT;  Laterality: N/A;   MICROLARYNGOSCOPY WITH CO2 LASER AND EXCISION OF VOCAL CORD LESION N/A 01/11/2017   Procedure: MICROLARYNGOSCOPY WITH CO2 LASER AND EXCISION OF VOCAL CORD LESION;  Surgeon: Vernadine Golas, MD;  Location: [facility] OR;  Service: ENT;  Laterality: N/A;  MICROLARYNGOSCOPY WITH LASER N/A 03/16/2015   Procedure: MICROLARYNGOSCOPY ;  Surgeon: Vernadine Golas, MD;  Location: [facility] OR;  Service: ENT;  Laterality: N/A;   peyronie's surgery     SHOULDER SURGERY Right    rotator cuff   SPINE SURGERY  09/2020   cervical surgery. steel plate, bone spurs, allograft.   THROAT SURGERY  1997   cancer removed    Family History  Problem Relation Age of Onset   Cancer Mother        bone   Hypertension Mother    Stroke Father    Hypertension Father    Healthy Child    Cancer Other        brain   Diabetes Other     Social History   Socioeconomic History   Marital status: Married    Spouse name: Not on file   Number of children: 4   Years of education: Not on file   Highest education level: GED or equivalent  Occupational History   Occupation: retired  Tobacco Use   Smoking status: Every Day    Current packs/day: 1.00    Average packs/day: 1 pack/day for 50.0 years  (50.0 ttl pk-yrs)    Types: Cigarettes   Smokeless tobacco: Never   Tobacco comments:    down to .5 ppd  11/02/2022 hfb  Vaping Use   Vaping status: Former  Substance and Sexual Activity   Alcohol use: Not on file   Drug use: No   Sexual activity: Not on file  Other Topics Concern   Not on file  Social History Narrative   Lives with wife       One level      Right hand   Social Drivers of Health   Financial Resource Strain: Low Risk  (04/29/2024)   Overall Financial Resource Strain (CARDIA)    Difficulty of Paying Living Expenses: Not hard at all  Food Insecurity: No Food Insecurity (04/29/2024)   Hunger Vital Sign    Worried About Running Out of Food in the Last Year: Never true    Ran Out of Food in the Last Year: Never true  Transportation Needs: No Transportation Needs (04/29/2024)   PRAPARE - Administrator, Civil Service (Medical): No    Lack of Transportation (Non-Medical): No  Physical Activity: Inactive (04/29/2024)   Exercise Vital Sign    Days of Exercise per Week: 0 days    Minutes of Exercise per Session: Not on file  Stress: Stress Concern Present (04/29/2024)   [employer] of Occupational Health - Occupational Stress Questionnaire    Feeling of Stress: To some extent  Social Connections: Socially Isolated (04/29/2024)   Social Connection and Isolation Panel    Frequency of Communication with Friends and Family: Once a week    Frequency of Social Gatherings with Friends and Family: Never    Attends Religious Services: Never    Database administrator or Organizations: No    Attends Engineer, structural: Not on file    Marital Status: Married  Catering manager Violence: Not At Risk (01/11/2023)   Humiliation, Afraid, Rape, and Kick questionnaire    Fear of Current or Ex-Partner: No    Emotionally Abused: No    Physically Abused: No    Sexually Abused: No    Outpatient Medications Prior to Visit  Medication Sig Dispense Refill    ALPRAZolam  (XANAX ) 0.25 MG tablet Take 1 tablet by mouth twice daily as needed for  anxiety 60 tablet 2   arformoterol  (BROVANA ) 15 MCG/2ML NEBU Take 2 mLs (15 mcg total) by nebulization 2 (two) times daily. 120 mL 6   budesonide  (PULMICORT ) 0.25 MG/2ML nebulizer solution Take 2 mLs (0.25 mg total) by nebulization in the morning and at bedtime. 60 mL 11   busPIRone  (BUSPAR ) 7.5 MG tablet Take 1 tablet (7.5 mg total) by mouth 2 (two) times daily. 60 tablet 2   clopidogrel  (PLAVIX ) 75 MG tablet Take 1 tablet by mouth once daily 90 tablet 0   cyanocobalamin  (VITAMIN B12) 1000 MCG/ML injection INJECT 1 ML (CC) INTRAMUSCULARLY ONCE A WEEK (Patient taking differently: Inject 100 mcg into the muscle every 30 (thirty) days.) 10 mL 0   cyclobenzaprine  (FLEXERIL ) 10 MG tablet TAKE 1 TABLET BY MOUTH THREE TIMES DAILY AS NEEDED FOR MUSCLE SPASMS 90 tablet 0   doxycycline  (VIBRA -TABS) 100 MG tablet Take 1 tablet (100 mg total) by mouth 2 (two) times daily. 20 tablet 0   DULoxetine  (CYMBALTA ) 60 MG capsule Take 1 capsule (60 mg total) by mouth 2 (two) times daily. 180 capsule 0   Dupilumab  (DUPIXENT ) 300 MG/2ML SOAJ Inject 300 mg into the skin every 14 (fourteen) days. 12 mL 1   fenofibrate  160 MG tablet Take 1 tablet by mouth once daily 90 tablet 0   finasteride  (PROSCAR ) 5 MG tablet Take 1 tablet by mouth once daily 90 tablet 0   furosemide  (LASIX ) 20 MG tablet Take 1 tablet (20 mg total) by mouth daily. 90 tablet 1   gabapentin  (NEURONTIN ) 400 MG capsule TAKE 1 CAPSULE BY MOUTH THREE TIMES DAILY 270 capsule 1   insulin  lispro (HUMALOG ) 100 UNIT/ML injection Inject 0.04 mLs (4 Units total) into the skin 3 (three) times daily with meals. 10 mL 11   Insulin  Pen Needle (PEN NEEDLES) 31G X 8 MM MISC Inject insulin  as directed 100 each 1   ipratropium-albuterol  (DUONEB) 0.5-2.5 (3) MG/3ML SOLN USE 1 VIAL IN NEBULIZER 4 TIMES DAILY 360 mL 0   Lancets (ONETOUCH DELICA PLUS LANCET33G) MISC USE 1  TO CHECK GLUCOSE THREE  TIMES DAILY (Patient taking differently: 1 each by Other route 3 (three) times daily.) 100 each 0   levothyroxine  (SYNTHROID ) 25 MCG tablet Take 1 tablet by mouth once daily 90 tablet 0   losartan  (COZAAR ) 50 MG tablet Take 1 tablet (50 mg total) by mouth daily. 90 tablet 2   Magnesium  Chloride (MAG64) 64 MG TBEC Take 1 tablet (64 mg total) by mouth daily. 90 tablet 1   metFORMIN  (GLUCOPHAGE ) 500 MG tablet Take 2 tablets (1,000 mg total) by mouth 2 (two) times daily with a meal.     metoprolol  succinate (TOPROL -XL) 100 MG 24 hr tablet Take 1 tablet (100 mg total) by mouth daily. Take with or immediately following a meal. 90 tablet 2   nitroGLYCERIN  (NITROSTAT ) 0.4 MG SL tablet Place 1 tablet (0.4 mg total) under the tongue every 5 (five) minutes as needed for chest pain. DISSOLVE ONE TABLET UNDER THE TONGUE EVERY 5 TO 10 MINUTES PRIOR TO ACTIVITIES WHICH MIGHT PRECIPITATE AN ATTACK 25 tablet 0   omega-3 acid ethyl esters (LOVAZA ) 1 g capsule Take 2 capsules (2 g total) by mouth 2 (two) times daily. 360 capsule 0   Omega-3 Fatty Acids (FISH OIL ) 1000 MG CAPS Take 2 capsules (2,000 mg total) by mouth in the morning and at bedtime. 180 capsule 12   ONETOUCH ULTRA test strip USE 1 STRIP TO CHECK GLUCOSE IN THE MORNING  AND 1 AT BEDTIME 100 each 0   oxyCODONE -acetaminophen  (PERCOCET/ROXICET) 5-325 MG tablet Take 1 tablet by mouth 2 (two) times daily as needed for severe pain (pain score 7-10). 60 tablet 0   pantoprazole  (PROTONIX ) 40 MG tablet Take 1 tablet (40 mg total) by mouth daily. 90 tablet 0   predniSONE  (DELTASONE ) 10 MG tablet Take 1 tablet by mouth once daily with breakfast 30 tablet 2   RELION INSULIN  SYRINGE 31G X 15/64 0.3 ML MISC AS DIRECTED 100 each 0   revefenacin  (YUPELRI ) 175 MCG/3ML nebulizer solution Take 3 mLs (175 mcg total) by nebulization daily. 90 mL 11   rosuvastatin  (CRESTOR ) 20 MG tablet Take 1 tablet (20 mg total) by mouth daily. 90 tablet 3   sodium chloride  HYPERTONIC 3 %  nebulizer solution 3 ml twice daily via nebulizer 180 mL 11   spironolactone  (ALDACTONE ) 25 MG tablet Take 1 tablet (25 mg total) by mouth daily. 90 tablet 3   tamsulosin  (FLOMAX ) 0.4 MG CAPS capsule TAKE 2 CAPSULES BY MOUTH ONCE DAILY AFTER SUPPER 180 capsule 0   torsemide  (DEMADEX ) 20 MG tablet Take 1 tablet (20 mg total) by mouth as needed (edema). 30 tablet 0   TOUJEO  SOLOSTAR 300 UNIT/ML Solostar Pen Inject 40 Units into the skin daily. Inject 40 units daily, increase by 2 units every 3 days until sugars are constantly 140 or lower. Max dose of 60 units daily. 18 mL 0   traZODone  (DESYREL ) 150 MG tablet Take 1 tablet by mouth once daily 270 tablet 0   VENTOLIN  HFA 108 (90 Base) MCG/ACT inhaler INHALE 2 PUFFS BY MOUTH EVERY 6 HOURS AS NEEDED FOR SHORTNESS OF BREATH FOR WHEEZING (Patient taking differently: Inhale 2 puffs into the lungs every 6 (six) hours as needed for shortness of breath or wheezing.) 18 g 0   Vitamin D , Ergocalciferol , (DRISDOL ) 1.25 MG (50000 UNIT) CAPS capsule TAKE 1 CAPSULE BY MOUTH TWICE A WEEK 12 capsule 3   HYDROcodone  bit-homatropine (HYDROMET) 5-1.5 MG/5ML syrup Take 5 mLs by mouth every 6 (six) hours as needed for cough. 120 mL 0   Facility-Administered Medications Prior to Visit  Medication Dose Route Frequency Provider Last Rate Last Admin   ipratropium-albuterol  (DUONEB) 0.5-2.5 (3) MG/3ML nebulizer solution 3 mL  3 mL Nebulization Once         Allergies  Allergen Reactions   Penicillins Rash and Hives    Has patient had a PCN reaction causing immediate rash, facial/tongue/throat swelling, SOB or lightheadedness with hypotension:unsure Has patient had a PCN reaction causing severe rash involving mucus membranes or skin necrosis:unsure Has patient had a PCN reaction that required hospitalization:No Has patient had a PCN reaction occurring within the last 10 years:No If all of the above answers are NO, then may proceed with Cephalosporin use.  rash    Bactrim  [Sulfamethoxazole -Trimethoprim ]     Dizziness, confusion, involuntary movements   Iodinated Contrast Media Rash    Also developed blisters   Penicillin G Rash   Tramadol Rash    Review of Systems  Constitutional:  Negative for appetite change, fatigue and fever.  HENT:  Negative for congestion, ear pain, sinus pressure and sore throat.   Respiratory:  Positive for shortness of breath and wheezing. Negative for cough and chest tightness.   Cardiovascular:  Negative for chest pain and palpitations.  Gastrointestinal:  Negative for abdominal pain, constipation, diarrhea, nausea and vomiting.  Genitourinary:  Positive for hematuria. Negative for dysuria.  Musculoskeletal:  Negative for arthralgias,  back pain, joint swelling and myalgias.  Skin:  Negative for rash.  Neurological:  Negative for dizziness, weakness and headaches.  Psychiatric/Behavioral:  Negative for dysphoric mood. The patient is not nervous/anxious.        Objective:        04/29/2024    3:30 PM 03/26/2024    2:11 PM 03/26/2024    2:10 PM  Vitals with BMI  Height 5' 10 5' 10   Weight 192 lbs 197 lbs   BMI 27.55 28.27   Systolic 128 135   Diastolic 68 61   Pulse 58  84    Orthostatic VS for the past 72 hrs (Last 3 readings):  Patient Position BP Location  04/29/24 1530 Sitting Left Arm     Physical Exam Vitals reviewed.  Constitutional:      Appearance: Normal appearance.   Cardiovascular:     Rate and Rhythm: Rhythm irregular. Frequent Extrasystoles are present.    Heart sounds: Heart sounds are distant.  Pulmonary:     Effort: Pulmonary effort is normal.     Breath sounds: Normal breath sounds.  Abdominal:     General: Bowel sounds are normal.     Palpations: Abdomen is soft.     Tenderness: There is no abdominal tenderness.   Musculoskeletal:     Right lower leg: No edema.     Left lower leg: No edema.   Neurological:     Mental Status: He is alert and oriented to person, place,  and time.   Psychiatric:        Mood and Affect: Mood normal.        Behavior: Behavior normal.     Health Maintenance Due  Topic Date Due   OPHTHALMOLOGY EXAM  Never done   Hepatitis C Screening  Never done   Zoster Vaccines- Shingrix (1 of 2) Never done   FOOT EXAM  01/11/2024   Medicare Annual Wellness (AWV)  01/13/2024   COVID-19 Vaccine (4 - Mixed Product risk 2024-25 season) 04/08/2024    There are no preventive care reminders to display for this patient.   Lab Results  Component Value Date   TSH 2.280 02/28/2024   Lab Results  Component Value Date   WBC 16.1 (H) 02/28/2024   HGB 14.9 02/28/2024   HCT 46.1 02/28/2024   MCV 95 02/28/2024   PLT 195 02/28/2024   Lab Results  Component Value Date   NA 134 02/28/2024   K 4.6 02/28/2024   CO2 26 02/28/2024   GLUCOSE 467 (H) 02/28/2024   BUN 19 02/28/2024   CREATININE 1.10 02/28/2024   BILITOT 0.5 02/28/2024   ALKPHOS 76 02/28/2024   AST 22 02/28/2024   ALT 21 02/28/2024   PROT 6.2 02/28/2024   ALBUMIN 3.8 02/28/2024   CALCIUM  8.8 02/28/2024   ANIONGAP 7 11/10/2022   EGFR 70 02/28/2024   GFR 74.13 08/17/2022   Lab Results  Component Value Date   CHOL 120 02/28/2024   Lab Results  Component Value Date   HDL 27 (L) 02/28/2024   Lab Results  Component Value Date   LDLCALC 23 02/28/2024   Lab Results  Component Value Date   TRIG 517 (H) 02/28/2024   Lab Results  Component Value Date   CHOLHDL 4.4 02/28/2024   Lab Results  Component Value Date   HGBA1C 11.8 (H) 02/28/2024       Assessment & Plan:  Dysuria Assessment & Plan: Acute hematuria with differential including nephrolithiasis, UTI,  BPH, and possible malignancy. CT showed cysts, no stones. Discussed bladder cancer potential if imaging and culture negative, may need cystoscopy. - Order urine culture and microscopy. - Consider imaging for nephrolithiasis. - Discuss potential cystoscopy if imaging and culture negative.  Orders: -      POCT URINALYSIS DIP (CLINITEK)  Other microscopic hematuria Assessment & Plan: Acute hematuria with differential including nephrolithiasis, UTI, BPH, and possible malignancy. CT showed cysts, no stones. Discussed bladder cancer potential if imaging and culture negative, may need cystoscopy. - Order urine culture and microscopy. - Consider imaging for nephrolithiasis. - Discuss potential cystoscopy if imaging and culture negative.  Orders: -     Urine Culture  Palpitations Assessment & Plan: Chronic urinary symptoms possibly contributing to hematuria. Symptoms may be due to BPH or nephrolithiasis. - Consider referral to urologist.  Orders: -     EKG 12-Lead  Intercostal pain Assessment & Plan: Irregular heart rate, PVCs not consistent on EKG. EKG shows ST depression, possible MI. On Plavix , causing bruising. Discussed ventricular arrhythmia potential and need for rhythm correction if frequent PVCs. - Perform EKG. - Send to ER for evaluation with troponins for possible MI. - Consider CT scan for cardiac evaluation. - Discuss potential need for rhythm correction if frequent PVCs.          No orders of the defined types were placed in this encounter.   Orders Placed This Encounter  Procedures   Urine Culture   POCT URINALYSIS DIP (CLINITEK)   EKG 12-Lead     Follow-up: No follow-ups on file.  An After Visit Summary was printed and given to the patient.   I,Lauren M Auman,acting as a Neurosurgeon for [employer], [employer].,have documented all relevant documentation on the behalf of Odilia Bennett, PA,as directed by  Odilia Bennett, PA while in the presence of Odilia Bennett, [place].    Odilia Bennett, [place] [facility] [phone]

## 2024-04-29 NOTE — Assessment & Plan Note (Signed)
 Chronic urinary symptoms possibly contributing to hematuria. Symptoms may be due to BPH or nephrolithiasis. - Consider referral to urologist.

## 2024-04-29 NOTE — Telephone Encounter (Signed)
 FYI Only or Action Required?: FYI only for provider  Patient was last seen in primary care on 04/04/2024 by Janece Means, FNP. Called Nurse Triage reporting Hematuria. Symptoms began today. Interventions attempted: Nothing. Symptoms are: stable.  Triage Disposition: See Physician Within 24 Hours  Patient/caregiver understands and will follow disposition?: Yes             Copied from CRM [id number]. Topic: Clinical - Red Word Triage >> Apr 29, 2024 10:54 AM Chrystal Crape R wrote: Pt wife [name] Lamp on line pt has blood in his urine, There are also blood stain in his under wear as well. Reason for Disposition  Blood in urine  (Exception: Could be normal menstrual bleeding.)  Answer Assessment - Initial Assessment Questions 1. COLOR of URINE: Describe the color of the urine.  (e.g., tea-colored, pink, red, bloody) Do you have blood clots in your urine? (e.g., none, pea, grape, small coin)     Blood in urine 2. ONSET: When did the bleeding start?      This morning 3. EPISODES: How many times has there been blood in the urine? or How many times today?     Each time 4. PAIN with URINATION: Is there any pain with passing your urine? If Yes, ask: How bad is the pain?  (Scale 1-10; or mild, moderate, severe)    - MILD: Complains slightly about urination hurting.    - MODERATE: Interferes with normal activities.      - SEVERE: Excruciating, unwilling or unable to urinate because of the pain.      5/10 5. FEVER: Do you have a fever? If Yes, ask: What is your temperature, how was it measured, and when did it start?     no 6. ASSOCIATED SYMPTOMS: Are you passing urine more frequently than usual?     no 7. OTHER SYMPTOMS: Do you have any other symptoms? (e.g., back/flank pain, abdomen pain, vomiting)     Back pain - and abdominal pain  Protocols used: Urine - Blood In-A-AH

## 2024-04-29 NOTE — Patient Instructions (Signed)
 VISIT SUMMARY:  Today, you were seen for blood in your urine and heart issues. You have a history of benign prostate hyperplasia (BPH) and have been experiencing difficulty urinating. You also reported an irregular heart rate and recent falls. We discussed the potential causes and next steps for your symptoms.  YOUR PLAN:  -HEMATURIA: Hematuria means blood in the urine. We will order a urine culture and microscopy to check for infection and consider imaging to look for kidney stones. If these tests are negative, we may need to perform a cystoscopy to examine your bladder for any abnormalities.  -BENIGN PROSTATIC HYPERPLASIA (BPH): BPH is an enlarged prostate gland that can cause urinary problems. Your chronic urinary symptoms may be due to BPH or kidney stones. We will consider referring you to a urologist for further evaluation.  -CARDIAC ARRHYTHMIA: Cardiac arrhythmia is an irregular heart rate. Your EKG shows changes that could indicate a heart attack. We will perform another EKG and send you to the emergency room for further evaluation, including checking your heart enzymes. We may also consider a CT scan to evaluate your heart and discuss the need for rhythm correction if your irregular heartbeats are frequent.  INSTRUCTIONS:  Please go to the emergency room immediately for further evaluation of your heart. Follow up with a urologist for your urinary symptoms. We will contact you with the results of your urine culture and microscopy.

## 2024-04-30 LAB — URINE CULTURE

## 2024-05-01 ENCOUNTER — Ambulatory Visit: Payer: Self-pay | Admitting: Physician Assistant

## 2024-05-06 ENCOUNTER — Other Ambulatory Visit: Payer: Self-pay | Admitting: Family Medicine

## 2024-05-06 ENCOUNTER — Encounter: Payer: Self-pay | Admitting: Family Medicine

## 2024-05-06 DIAGNOSIS — K219 Gastro-esophageal reflux disease without esophagitis: Secondary | ICD-10-CM

## 2024-05-06 DIAGNOSIS — R1312 Dysphagia, oropharyngeal phase: Secondary | ICD-10-CM

## 2024-05-06 MED ORDER — OXYCODONE-ACETAMINOPHEN 5-325 MG PO TABS: 1.0000 | ORAL_TABLET | Freq: Two times a day (BID) | ORAL | 0 refills | Status: DC | PRN

## 2024-05-06 NOTE — Progress Notes (Signed)
 Virtual Visit via Video Note   This visit type was conducted per patient request This format is felt to be appropriate for this patient at this time.  All issues noted in this document were discussed and addressed.  A limited physical exam was performed with this format.  A verbal consent was obtained for the virtual visit.   Date:  05/12/2024   ID:  Don [name], DOB 11/05/1951, MRN [id number]  Patient Location: Home Provider Location: Office/Clinic  PCP:  Sherre Clapper, MD   Chief Complaint  Patient presents with   Shob     History of Present Illness:    Patient c/o shob, coughing of phlegm (green), no fever or chills, using inhalers and nebulizer which id not helping much. Would like steroid and axbx. Using his O2 more often. 2.5 L daily.  Symptoms started 3-4 days. Worsening.  Past Medical History:  Diagnosis Date   Abnormal chest CT 02/01/2024   Absence of bladder continence 01/08/2022   Acquired hypothyroidism 03/02/2024   Acute infection of nasal sinus 01/13/2023   Acute non-recurrent maxillary sinusitis 01/13/2023   Anxiety state 02/02/2022   Arthritis    Asymptomatic LV dysfunction 10/18/2017   Atherosclerosis of aorta (HCC) 01/13/2023   B12 deficiency anemia 08/25/2021   Basal cell carcinoma    Benign prostatic hyperplasia with incomplete bladder emptying 01/14/2019   Cancer (HCC)    throat - 1997, throat - 2018   Cardiomyopathy, secondary (HCC) 12/01/2016   Overview:  EF 47% 12/26/16   Chronic coronary artery disease 10/18/2017   Chronic cough 12/16/2023   Chronic ischemic heart disease 11/13/1998   Jan 22, 2003 Entered By: CHRISTOPHER OLIVE J Comment:  massive Mi in 2000 per patient hsitory, 2nd Mi in 2001Mar 11, 2004 Entered By: CHRISTOPHER OLIVE J Comment: Had stent placedin 2000,cath/angio in 2001   Chronic neck pain 05/16/2022   Chronic pain syndrome 06/04/2022   Chronic respiratory failure with hypoxia (HCC) 05/16/2022   COPD (chronic obstructive  pulmonary disease) (HCC)    COPD exacerbation (HCC) 07/30/2023   Coronary artery disease involving native coronary artery of native heart with angina pectoris (HCC) 12/01/2016   Overview:  He has hx of IWMI in remote past, last cath in 2012 at [facility] showed chronic total occlusion of previously stented RCA, with good collaterals, a 40% LAD stenosis, inferior hypokinesis and EF 45%.    He's been lost to Cardiology f/u since 2015, but has not had any recurrent events   Deficiency anemia 08/25/2021   Diabetic polyneuropathy associated with type 2 diabetes mellitus (HCC) 05/16/2022   Dyslipidemia 12/01/2016   Encounter for Medicare annual wellness exam 01/13/2023   Erectile dysfunction due to diseases classified elsewhere 06/12/2019   GAD (generalized anxiety disorder) 02/02/2022   GERD (gastroesophageal reflux disease)    Hallucination 10/08/2022   Hyperlipidemia    Hypertension    Hypertensive heart disease with heart failure (HCC)    Insomnia 01/28/2021   Iron  deficiency 07/07/2023   Iron  deficiency anemia due to chronic blood loss 08/25/2021   Lesion of vocal cord    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 Jan 22, 2003 Entered By: CHRISTOPHER OLIVE J Comment: of skin - removed x2 inpast Jan 22, 2003 Entered By: CHRISTOPHER OLIVE J Comment: of skin - removed x2 inpast   Melanoma of back (HCC)    melanoma on back   Memory loss 03/08/2020   Mixed simple and mucopurulent chronic bronchitis (HCC) 03/02/2024   Moderate recurrent major depression (HCC) 03/02/2023   Mucopurulent chronic bronchitis (HCC) 04/08/2023    Oropharyngeal dysphagia 08/12/2018   Other ill-defined and unknown causes of morbidity and mortality 11/13/1993   Formatting of this note might be different from the original. 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   Throat cancer (HCC)    Tobacco use disorder 06/12/2019   Type 2 diabetes mellitus with hyperglycemia, with long-term current use of insulin  (HCC) 07/30/2023   URI, acute 05/31/2023   Urinary urgency 04/08/2023   Ventricular ectopy 11/09/2022   Vertigo 04/08/2023   Vitamin D  deficiency disease 01/13/2023    Past Surgical History:  Procedure Laterality Date   ABDOMINAL ANGIOGRAM N/A 01/13/2015   Procedure: ABDOMINAL ANGIOGRAM;  Surgeon: Krystal JULIANNA Doing, MD;  Location: [facility] CATH LAB;  Service: Cardiovascular;  Laterality: N/A;   APPENDECTOMY     BACK SURGERY     CARDIAC CATHETERIZATION  2000/2012   with stents in 2000   CHOLECYSTECTOMY     COLONOSCOPY     ELBOW SURGERY     bilateral   ESOPHAGOGASTRODUODENOSCOPY     KNEE SURGERY Left    MICROLARYNGOSCOPY WITH CO2 LASER AND EXCISION OF VOCAL CORD LESION N/A 11/23/2016   Procedure: MICROLARYNGOSCOPY  AND EXCISION OF VOCAL CORD LESION;  Surgeon: Norleen Notice, MD;  Location: [facility] OR;  Service: ENT;  Laterality: N/A;   MICROLARYNGOSCOPY WITH CO2 LASER AND EXCISION OF VOCAL CORD LESION N/A 01/11/2017   Procedure: MICROLARYNGOSCOPY WITH CO2 LASER AND EXCISION OF VOCAL CORD LESION;   Surgeon: Norleen Notice, MD;  Location: [facility] OR;  Service: ENT;  Laterality: N/A;   MICROLARYNGOSCOPY WITH LASER N/A 03/16/2015   Procedure: MICROLARYNGOSCOPY ;  Surgeon: Norleen Notice, MD;  Location: [facility] OR;  Service: ENT;  Laterality: N/A;   peyronie's surgery     SHOULDER SURGERY Right    rotator cuff   SPINE SURGERY  09/2020   cervical surgery. steel plate, bone spurs, allograft.   THROAT SURGERY  1997   cancer removed    Family History  Problem Relation Age of Onset   Cancer Mother        bone   Hypertension Mother    Stroke Father    Hypertension Father    Healthy Child    Cancer Other        brain   Diabetes Other     Social History   Socioeconomic History   Marital status: Married    Spouse name: Not on file   Number of children: 4   Years of education: Not on file  Highest education level: GED or equivalent  Occupational History   Occupation: retired  Tobacco Use   Smoking status: Every Day    Current packs/day: 1.00    Average packs/day: 1 pack/day for 50.0 years (50.0 ttl pk-yrs)    Types: Cigarettes   Smokeless tobacco: Never   Tobacco comments:    down to .5 ppd  11/02/2022 hfb  Vaping Use   Vaping status: Former  Substance and Sexual Activity   Alcohol use: Not on file   Drug use: No   Sexual activity: Not on file  Other Topics Concern   Not on file  Social History Narrative   Lives with wife       One level      Right hand   Social Drivers of Health   Financial Resource Strain: Low Risk  (04/29/2024)   Overall Financial Resource Strain (CARDIA)    Difficulty of Paying Living Expenses: Not hard at all  Food Insecurity: No Food Insecurity (04/29/2024)   Hunger Vital Sign    Worried About Running Out of Food in the Last Year: Never true    Ran Out of Food in the Last Year: Never true  Transportation Needs: No Transportation Needs (04/29/2024)   PRAPARE - Administrator, Civil Service (Medical): No    Lack of Transportation  (Non-Medical): No  Physical Activity: Inactive (04/29/2024)   Exercise Vital Sign    Days of Exercise per Week: 0 days    Minutes of Exercise per Session: Not on file  Stress: Stress Concern Present (04/29/2024)   [employer] of Occupational Health - Occupational Stress Questionnaire    Feeling of Stress: To some extent  Social Connections: Socially Isolated (04/29/2024)   Social Connection and Isolation Panel    Frequency of Communication with Friends and Family: Once a week    Frequency of Social Gatherings with Friends and Family: Never    Attends Religious Services: Never    Database administrator or Organizations: No    Attends Engineer, structural: Not on file    Marital Status: Married  Catering manager Violence: Not At Risk (01/11/2023)   Humiliation, Afraid, Rape, and Kick questionnaire    Fear of Current or Ex-Partner: No    Emotionally Abused: No    Physically Abused: No    Sexually Abused: No    Outpatient Medications Prior to Visit  Medication Sig Dispense Refill   ALPRAZolam  (XANAX ) 0.25 MG tablet Take 1 tablet by mouth twice daily as needed for anxiety 60 tablet 2   arformoterol  (BROVANA ) 15 MCG/2ML NEBU Take 2 mLs (15 mcg total) by nebulization 2 (two) times daily. 120 mL 6   budesonide  (PULMICORT ) 0.25 MG/2ML nebulizer solution Take 2 mLs (0.25 mg total) by nebulization in the morning and at bedtime. 60 mL 11   busPIRone  (BUSPAR ) 7.5 MG tablet Take 1 tablet (7.5 mg total) by mouth 2 (two) times daily. 60 tablet 2   clopidogrel  (PLAVIX ) 75 MG tablet Take 1 tablet by mouth once daily 90 tablet 0   cyanocobalamin  (VITAMIN B12) 1000 MCG/ML injection INJECT 1 ML (CC) INTRAMUSCULARLY ONCE A WEEK (Patient taking differently: Inject 100 mcg into the muscle every 30 (thirty) days.) 10 mL 0   cyclobenzaprine  (FLEXERIL ) 10 MG tablet Take 1 tablet by mouth three times daily as needed for muscle spasm 90 tablet 0   doxycycline  (VIBRA -TABS) 100 MG tablet Take 1 tablet  (100 mg total) by mouth 2 (two) times daily.  20 tablet 0   DULoxetine  (CYMBALTA ) 60 MG capsule Take 1 capsule (60 mg total) by mouth 2 (two) times daily. 180 capsule 0   Dupilumab  (DUPIXENT ) 300 MG/2ML SOAJ Inject 300 mg into the skin every 14 (fourteen) days. 12 mL 1   fenofibrate  160 MG tablet Take 1 tablet by mouth once daily 90 tablet 0   finasteride  (PROSCAR ) 5 MG tablet Take 1 tablet by mouth once daily 90 tablet 0   furosemide  (LASIX ) 20 MG tablet Take 1 tablet (20 mg total) by mouth daily. 90 tablet 1   gabapentin  (NEURONTIN ) 400 MG capsule TAKE 1 CAPSULE BY MOUTH THREE TIMES DAILY 270 capsule 1   insulin  lispro (HUMALOG ) 100 UNIT/ML injection Inject 0.04 mLs (4 Units total) into the skin 3 (three) times daily with meals. 10 mL 11   Insulin  Pen Needle (PEN NEEDLES) 31G X 8 MM MISC Inject insulin  as directed 100 each 1   ipratropium-albuterol  (DUONEB) 0.5-2.5 (3) MG/3ML SOLN USE 1 VIAL IN NEBULIZER 4 TIMES DAILY 360 mL 0   Lancets (ONETOUCH DELICA PLUS LANCET33G) MISC USE 1  TO CHECK GLUCOSE THREE TIMES DAILY (Patient taking differently: 1 each by Other route 3 (three) times daily.) 100 each 0   levothyroxine  (SYNTHROID ) 25 MCG tablet Take 1 tablet by mouth once daily 90 tablet 0   losartan  (COZAAR ) 50 MG tablet Take 1 tablet (50 mg total) by mouth daily. 90 tablet 2   Magnesium  Chloride (MAG64) 64 MG TBEC Take 1 tablet (64 mg total) by mouth daily. 90 tablet 1   metFORMIN  (GLUCOPHAGE ) 500 MG tablet Take 2 tablets (1,000 mg total) by mouth 2 (two) times daily with a meal.     metoprolol  succinate (TOPROL -XL) 100 MG 24 hr tablet Take 1 tablet (100 mg total) by mouth daily. Take with or immediately following a meal. 90 tablet 2   nitroGLYCERIN  (NITROSTAT ) 0.4 MG SL tablet Place 1 tablet (0.4 mg total) under the tongue every 5 (five) minutes as needed for chest pain. DISSOLVE ONE TABLET UNDER THE TONGUE EVERY 5 TO 10 MINUTES PRIOR TO ACTIVITIES WHICH MIGHT PRECIPITATE AN ATTACK 25 tablet 0    omega-3 acid ethyl esters (LOVAZA ) 1 g capsule Take 2 capsules (2 g total) by mouth 2 (two) times daily. 360 capsule 0   Omega-3 Fatty Acids (FISH OIL ) 1000 MG CAPS Take 2 capsules (2,000 mg total) by mouth in the morning and at bedtime. 180 capsule 12   ONETOUCH ULTRA test strip USE 1 STRIP TO CHECK GLUCOSE IN THE MORNING AND 1 AT BEDTIME 100 each 0   oxyCODONE -acetaminophen  (PERCOCET/ROXICET) 5-325 MG tablet Take 1 tablet by mouth 2 (two) times daily as needed for severe pain (pain score 7-10). 60 tablet 0   pantoprazole  (PROTONIX ) 40 MG tablet Take 1 tablet by mouth once daily 90 tablet 0   predniSONE  (DELTASONE ) 10 MG tablet Take 1 tablet by mouth once daily with breakfast 30 tablet 2   RELION INSULIN  SYRINGE 31G X 15/64 0.3 ML MISC AS DIRECTED 100 each 0   revefenacin  (YUPELRI ) 175 MCG/3ML nebulizer solution Take 3 mLs (175 mcg total) by nebulization daily. 90 mL 11   rosuvastatin  (CRESTOR ) 20 MG tablet Take 1 tablet (20 mg total) by mouth daily. 90 tablet 3   sodium chloride  HYPERTONIC 3 % nebulizer solution 3 ml twice daily via nebulizer 180 mL 11   spironolactone  (ALDACTONE ) 25 MG tablet Take 1 tablet (25 mg total) by mouth daily. 90 tablet 3  tamsulosin  (FLOMAX ) 0.4 MG CAPS capsule TAKE 2 CAPSULES BY MOUTH ONCE DAILY AFTER SUPPER 180 capsule 0   torsemide  (DEMADEX ) 20 MG tablet Take 1 tablet (20 mg total) by mouth as needed (edema). 30 tablet 0   traZODone  (DESYREL ) 150 MG tablet Take 1 tablet by mouth once daily 270 tablet 0   VENTOLIN  HFA 108 (90 Base) MCG/ACT inhaler INHALE 2 PUFFS BY MOUTH EVERY 6 HOURS AS NEEDED FOR SHORTNESS OF BREATH FOR WHEEZING (Patient taking differently: Inhale 2 puffs into the lungs every 6 (six) hours as needed for shortness of breath or wheezing.) 18 g 0   Vitamin D , Ergocalciferol , (DRISDOL ) 1.25 MG (50000 UNIT) CAPS capsule TAKE 1 CAPSULE BY MOUTH TWICE A WEEK 12 capsule 3   TOUJEO  SOLOSTAR 300 UNIT/ML Solostar Pen Inject 40 Units into the skin daily.  Inject 40 units daily, increase by 2 units every 3 days until sugars are constantly 140 or lower. Max dose of 60 units daily. 18 mL 0   Facility-Administered Medications Prior to Visit  Medication Dose Route Frequency Provider Last Rate Last Admin   ipratropium-albuterol  (DUONEB) 0.5-2.5 (3) MG/3ML nebulizer solution 3 mL  3 mL Nebulization Once         Allergies  Allergen Reactions   Penicillins Rash and Hives    Has patient had a PCN reaction causing immediate rash, facial/tongue/throat swelling, SOB or lightheadedness with hypotension:unsure Has patient had a PCN reaction causing severe rash involving mucus membranes or skin necrosis:unsure Has patient had a PCN reaction that required hospitalization:No Has patient had a PCN reaction occurring within the last 10 years:No If all of the above answers are NO, then may proceed with Cephalosporin use.  rash   Bactrim  [Sulfamethoxazole -Trimethoprim ]     Dizziness, confusion, involuntary movements   Iodinated Contrast Media Rash    Also developed blisters   Penicillin G Rash   Tramadol Rash     Social History   Tobacco Use   Smoking status: Every Day    Current packs/day: 1.00    Average packs/day: 1 pack/day for 50.0 years (50.0 ttl pk-yrs)    Types: Cigarettes   Smokeless tobacco: Never   Tobacco comments:    down to .5 ppd  11/02/2022 hfb  Vaping Use   Vaping status: Former  Substance Use Topics   Drug use: No     Review of Systems  Constitutional:  Positive for diaphoresis. Negative for chills and fever.  HENT:  Negative for congestion, ear pain and sore throat.   Respiratory:  Positive for cough, sputum production and shortness of breath.   Cardiovascular:  Positive for chest pain.  Gastrointestinal:  Positive for abdominal pain.     Labs/Other Tests and Data Reviewed:    Recent Labs: 05/31/2023: Magnesium  1.6 07/06/2023: NT-Pro BNP 665 02/28/2024: ALT 21; BUN 19; Creatinine, Ser 1.10; Hemoglobin 14.9; Platelets  195; Potassium 4.6; Sodium 134; TSH 2.280   Recent Lipid Panel Lab Results  Component Value Date/Time   CHOL 120 02/28/2024 10:00 AM   TRIG 517 (H) 02/28/2024 10:00 AM   HDL 27 (L) 02/28/2024 10:00 AM   CHOLHDL 4.4 02/28/2024 10:00 AM   LDLCALC 23 02/28/2024 10:00 AM    Wt Readings from Last 3 Encounters:  05/07/24 196 lb (88.9 kg)  04/29/24 192 lb (87.1 kg)  03/26/24 197 lb (89.4 kg)     Objective:    Vital Signs:  BP (!) 131/99   Pulse 85   Ht 5' 10 (1.778 m)  Wt 196 lb (88.9 kg)   SpO2 90%   BMI 28.12 kg/m    Physical Exam   ASSESSMENT & PLAN:   COPD exacerbation (HCC) Assessment & Plan: Sent Levofloxacin  and prednisone  Continue current inhalers.  Continue oxygen .   Other orders -     levoFLOXacin ; Take 1 tablet (750 mg total) by mouth daily.  Dispense: 7 tablet; Refill: 0 -     predniSONE ; Take 3 tablets (60 mg total) by mouth daily with breakfast for 3 days, THEN 2 tablets (40 mg total) daily with breakfast for 3 days, THEN 1 tablet (20 mg total) daily with breakfast for 3 days.  Dispense: 18 tablet; Refill: 0     No orders of the defined types were placed in this encounter.    Meds ordered this encounter  Medications   levofloxacin  (LEVAQUIN ) 750 MG tablet    Sig: Take 1 tablet (750 mg total) by mouth daily.    Dispense:  7 tablet    Refill:  0   predniSONE  (DELTASONE ) 20 MG tablet    Sig: Take 3 tablets (60 mg total) by mouth daily with breakfast for 3 days, THEN 2 tablets (40 mg total) daily with breakfast for 3 days, THEN 1 tablet (20 mg total) daily with breakfast for 3 days.    Dispense:  18 tablet    Refill:  0     Follow Up:  In Person prn  Signed, Abigail Free, MD  05/12/2024 11:07 PM    [facility] 

## 2024-05-07 ENCOUNTER — Telehealth (INDEPENDENT_AMBULATORY_CARE_PROVIDER_SITE_OTHER): Admitting: Family Medicine

## 2024-05-07 VITALS — BP 131/99 | HR 85 | Ht 70.0 in | Wt 196.0 lb

## 2024-05-07 DIAGNOSIS — J441 Chronic obstructive pulmonary disease with (acute) exacerbation: Secondary | ICD-10-CM

## 2024-05-07 MED ORDER — LEVOFLOXACIN 750 MG PO TABS: 750.0000 mg | ORAL_TABLET | Freq: Every day | ORAL | 0 refills | Status: DC

## 2024-05-07 MED ORDER — PREDNISONE 20 MG PO TABS: ORAL_TABLET | ORAL | 0 refills | Status: AC

## 2024-05-09 ENCOUNTER — Other Ambulatory Visit: Payer: Self-pay

## 2024-05-09 MED ORDER — TOUJEO SOLOSTAR 300 UNIT/ML ~~LOC~~ SOPN: 40.0000 [IU] | PEN_INJECTOR | Freq: Every day | SUBCUTANEOUS | 0 refills | Status: DC

## 2024-05-11 NOTE — Assessment & Plan Note (Signed)
 Sent Levofloxacin  and prednisone  Continue current inhalers.  Continue oxygen .

## 2024-05-12 ENCOUNTER — Encounter: Payer: Self-pay | Admitting: Family Medicine

## 2024-05-15 DIAGNOSIS — J449 Chronic obstructive pulmonary disease, unspecified: Secondary | ICD-10-CM | POA: Diagnosis not present

## 2024-05-15 DIAGNOSIS — C329 Malignant neoplasm of larynx, unspecified: Secondary | ICD-10-CM | POA: Diagnosis not present

## 2024-05-15 DIAGNOSIS — R1314 Dysphagia, pharyngoesophageal phase: Secondary | ICD-10-CM | POA: Diagnosis not present

## 2024-05-15 DIAGNOSIS — K219 Gastro-esophageal reflux disease without esophagitis: Secondary | ICD-10-CM | POA: Diagnosis not present

## 2024-05-16 DIAGNOSIS — J9610 Chronic respiratory failure, unspecified whether with hypoxia or hypercapnia: Secondary | ICD-10-CM | POA: Diagnosis not present

## 2024-05-16 DIAGNOSIS — J449 Chronic obstructive pulmonary disease, unspecified: Secondary | ICD-10-CM | POA: Diagnosis not present

## 2024-05-26 ENCOUNTER — Ambulatory Visit (HOSPITAL_COMMUNITY)
Admission: RE | Admit: 2024-05-26 | Discharge: 2024-05-26 | Disposition: A | Discharge status: Home / Self Care | Source: Ambulatory Visit | Attending: Surgery | Admitting: Surgery

## 2024-05-26 ENCOUNTER — Ambulatory Visit: Attending: Surgery | Admitting: Surgery

## 2024-05-26 ENCOUNTER — Other Ambulatory Visit: Payer: Self-pay

## 2024-05-26 ENCOUNTER — Encounter: Payer: Self-pay | Admitting: Surgery

## 2024-05-26 VITALS — BP 128/71 | HR 83 | Temp 97.8°F | Ht 70.0 in | Wt 196.0 lb

## 2024-05-26 DIAGNOSIS — I70213 Atherosclerosis of native arteries of extremities with intermittent claudication, bilateral legs: Secondary | ICD-10-CM | POA: Diagnosis not present

## 2024-05-26 DIAGNOSIS — I739 Peripheral vascular disease, unspecified: Secondary | ICD-10-CM | POA: Diagnosis not present

## 2024-05-26 LAB — VAS US ABI WITH/WO TBI
Left ABI: 0.82
Right ABI: 0.58

## 2024-05-26 NOTE — Progress Notes (Addendum)
 [facility]  Patient name: Don [name] MRN: [id number] DOB: 08-23-1951 Sex: male   REQUESTING PROVIDER:    Abigail Free   REASON FOR CONSULT:    PAD  HISTORY OF PRESENT ILLNESS:   Don [name] is a 73 y.o. male, who is referred for follow-up evaluation of his PAD.  I have not seen the patient since 2018.  He has a history of a left common iliac stent placed in [place]  in mid 2000's.  On 01/13/2015 I performed left external iliac stenting.  He is complaining that his legs will give out sometimes.  He also gets right leg cramping with walking.  He does not have any open wounds.  He complains that both feet are very cold   The patient is medically managed for hypertension.  He takes a statin for hypercholesterolemia.  He has COPD secondary to smoking.  He has a history of PCI for coronary artery disease.  He has been treated for throat cancer.  PAST MEDICAL HISTORY    Past Medical History:  Diagnosis Date   Abnormal chest CT 02/01/2024   Absence of bladder continence 01/08/2022   Acquired hypothyroidism 03/02/2024   Acute infection of nasal sinus 01/13/2023   Acute non-recurrent maxillary sinusitis 01/13/2023   Anxiety state 02/02/2022   Arthritis    Asymptomatic LV dysfunction 10/18/2017   Atherosclerosis of aorta (HCC) 01/13/2023   B12 deficiency anemia 08/25/2021   Basal cell carcinoma    Benign prostatic hyperplasia with incomplete bladder emptying 01/14/2019   Cancer (HCC)    throat - 1997, throat - 2018   Cardiomyopathy, secondary (HCC) 12/01/2016   Overview:  EF 47% 12/26/16   Chronic coronary artery disease 10/18/2017   Chronic cough 12/16/2023   Chronic ischemic heart disease 11/13/1998   Jan 22, 2003 Entered By: CHRISTOPHER OLIVE J Comment:  massive Mi in 2000 per patient hsitory, 2nd Mi in 2001Mar 11, 2004 Entered By: CHRISTOPHER OLIVE J Comment: Had stent placedin 2000,cath/angio in 2001    Chronic neck pain 05/16/2022   Chronic pain syndrome 06/04/2022   Chronic respiratory failure with hypoxia (HCC) 05/16/2022   COPD (chronic obstructive pulmonary disease) (HCC)    COPD exacerbation (HCC) 07/30/2023   Coronary artery disease involving native coronary artery of native heart with angina pectoris (HCC) 12/01/2016   Overview:  He has hx of IWMI in remote past, last cath in 2012 at [facility] showed chronic total occlusion of previously stented RCA, with good collaterals, a 40% LAD stenosis, inferior hypokinesis and EF 45%.    He's been lost to Cardiology f/u since 2015, but has not had any recurrent events   Deficiency anemia 08/25/2021   Diabetic polyneuropathy associated with type 2 diabetes mellitus (HCC) 05/16/2022   Dyslipidemia 12/01/2016   Encounter for Medicare annual wellness exam 01/13/2023   Erectile dysfunction due to diseases classified elsewhere 06/12/2019   GAD (generalized anxiety disorder) 02/02/2022   GERD (gastroesophageal reflux disease)    Hallucination 10/08/2022   Hyperlipidemia    Hypertension    Hypertensive heart disease with heart failure (HCC)    Insomnia 01/28/2021   Iron  deficiency 07/07/2023   Iron  deficiency anemia due to chronic blood loss 08/25/2021   Lesion of vocal cord    Leukoplakia of larynx 11/15/2016   Long term (current) use of systemic steroids 12/16/2023   Long-term use of aspirin  therapy 10/02/2018   Lumbar back pain 06/04/2022   Lung nodule 11/30/2023   Lung nodules 08/17/2022  Macular degeneration of both eyes 03/02/2024   Malfunction of penile prosthesis (HCC) 01/14/2019   Malignant neoplasm of skin 01/28/2021   Formatting of this note might be different from the original. Jan 22, 2003 Entered By: CHRISTOPHER OLIVE J Comment: of skin - removed x2 inpast Jan 22, 2003 Entered By: CHRISTOPHER OLIVE J Comment: of skin - removed x2 inpast   Melanoma of back (HCC)    melanoma on back   Memory loss 03/08/2020   Mixed simple and mucopurulent  chronic bronchitis (HCC) 03/02/2024   Moderate recurrent major depression (HCC) 03/02/2023   Mucopurulent chronic bronchitis (HCC) 04/08/2023   Oropharyngeal dysphagia 08/12/2018   Other ill-defined and unknown causes of morbidity and mortality 11/13/1993   Formatting of this note might be different from the original. 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   Throat cancer (HCC)    Tobacco use disorder 06/12/2019   Type 2 diabetes mellitus with hyperglycemia, with long-term current use of insulin  (HCC) 07/30/2023   URI, acute 05/31/2023   Urinary urgency 04/08/2023   Ventricular ectopy 11/09/2022   Vertigo 04/08/2023   Vitamin D  deficiency disease 01/13/2023     FAMILY HISTORY   Family History  Problem Relation Age of Onset   Cancer Mother        bone   Hypertension Mother    Stroke Father    Hypertension Father    Healthy Child    Cancer Other        brain   Diabetes Other     SOCIAL HISTORY:   Social History   Socioeconomic History   Marital status: Married    Spouse name: Not on file   Number of children: 4   Years of education: Not on file   Highest education level: GED or equivalent  Occupational History   Occupation: retired  Tobacco Use   Smoking status: Every Day    Current packs/day: 1.00    Average packs/day: 1 pack/day for 50.0 years (50.0 ttl pk-yrs)    Types: Cigarettes   Smokeless tobacco:  Never   Tobacco comments:    down to .5 ppd  11/02/2022 hfb  Vaping Use   Vaping status: Former  Substance and Sexual Activity   Alcohol use: Not on file   Drug use: No   Sexual activity: Not on file  Other Topics Concern   Not on file  Social History Narrative   Lives with wife       One level      Right hand   Social Drivers of Health   Financial Resource Strain: Low Risk  (04/29/2024)   Overall Financial Resource Strain (CARDIA)    Difficulty of Paying Living Expenses: Not hard at all  Food Insecurity: No Food Insecurity (04/29/2024)   Hunger Vital Sign    Worried About Running Out of Food in the Last Year: Never true    Ran Out of Food in  the Last Year: Never true  Transportation Needs: No Transportation Needs (04/29/2024)   PRAPARE - Administrator, Civil Service (Medical): No    Lack of Transportation (Non-Medical): No  Physical Activity: Inactive (04/29/2024)   Exercise Vital Sign    Days of Exercise per Week: 0 days    Minutes of Exercise per Session: Not on file  Stress: Stress Concern Present (04/29/2024)   [employer] of Occupational Health - Occupational Stress Questionnaire    Feeling of Stress: To some extent  Social Connections: Socially Isolated (04/29/2024)   Social Connection and Isolation Panel    Frequency of Communication with Friends and Family: Once a week    Frequency of Social Gatherings with Friends and Family: Never    Attends Religious Services: Never    Database administrator or Organizations: No    Attends Engineer, structural: Not on file    Marital Status: Married  Catering manager Violence: Not At Risk (01/11/2023)   Humiliation, Afraid, Rape, and Kick questionnaire    Fear of Current or Ex-Partner: No    Emotionally Abused: No    Physically Abused: No    Sexually Abused: No    ALLERGIES:    Allergies  Allergen Reactions   Penicillins Rash and Hives    Has patient had a PCN reaction causing immediate rash,  facial/tongue/throat swelling, SOB or lightheadedness with hypotension:unsure Has patient had a PCN reaction causing severe rash involving mucus membranes or skin necrosis:unsure Has patient had a PCN reaction that required hospitalization:No Has patient had a PCN reaction occurring within the last 10 years:No If all of the above answers are NO, then may proceed with Cephalosporin use.  rash   Bactrim  [Sulfamethoxazole -Trimethoprim ]     Dizziness, confusion, involuntary movements   Iodinated Contrast Media Rash    Also developed blisters   Penicillin G Rash   Tramadol Rash    CURRENT MEDICATIONS:    Current Outpatient Medications  Medication Sig Dispense Refill   ALPRAZolam  (XANAX ) 0.25 MG tablet Take 1 tablet by mouth twice daily as needed for anxiety 60 tablet 2   arformoterol  (BROVANA ) 15 MCG/2ML NEBU Take 2 mLs (15 mcg total) by nebulization 2 (two) times daily. 120 mL 6   budesonide  (PULMICORT ) 0.25 MG/2ML nebulizer solution Take 2 mLs (0.25 mg total) by nebulization in the morning and at bedtime. 60 mL 11   busPIRone  (BUSPAR ) 7.5 MG tablet Take 1 tablet (7.5 mg total) by mouth 2 (two) times daily. 60 tablet 2   clopidogrel  (PLAVIX ) 75 MG tablet Take 1 tablet by mouth once daily 90 tablet 0   cyanocobalamin  (VITAMIN B12) 1000 MCG/ML injection INJECT 1 ML (CC) INTRAMUSCULARLY ONCE A WEEK (Patient taking differently: Inject 100 mcg into the muscle every 30 (thirty) days.) 10 mL 0   cyclobenzaprine  (FLEXERIL ) 10 MG tablet Take 1 tablet by mouth three times daily as needed for muscle spasm 90 tablet 0   doxycycline  (VIBRA -TABS) 100 MG tablet Take 1 tablet (100 mg total) by mouth 2 (two) times daily. 20 tablet 0   DULoxetine  (CYMBALTA ) 60 MG capsule Take 1 capsule (60 mg total) by mouth 2 (two) times daily. 180 capsule 0   Dupilumab  (DUPIXENT ) 300 MG/2ML SOAJ Inject 300 mg into the skin every 14 (fourteen) days. 12 mL 1   fenofibrate  160 MG tablet Take 1 tablet by mouth once daily 90  tablet 0   finasteride  (PROSCAR ) 5 MG tablet Take 1 tablet by mouth once daily  90 tablet 0   furosemide  (LASIX ) 20 MG tablet Take 1 tablet (20 mg total) by mouth daily. 90 tablet 1   gabapentin  (NEURONTIN ) 400 MG capsule TAKE 1 CAPSULE BY MOUTH THREE TIMES DAILY 270 capsule 1   insulin  lispro (HUMALOG ) 100 UNIT/ML injection Inject 0.04 mLs (4 Units total) into the skin 3 (three) times daily with meals. 10 mL 11   Insulin  Pen Needle (PEN NEEDLES) 31G X 8 MM MISC Inject insulin  as directed 100 each 1   ipratropium-albuterol  (DUONEB) 0.5-2.5 (3) MG/3ML SOLN USE 1 VIAL IN NEBULIZER 4 TIMES DAILY 360 mL 0   Lancets (ONETOUCH DELICA PLUS LANCET33G) MISC USE 1  TO CHECK GLUCOSE THREE TIMES DAILY (Patient taking differently: 1 each by Other route 3 (three) times daily.) 100 each 0   levofloxacin  (LEVAQUIN ) 750 MG tablet Take 1 tablet (750 mg total) by mouth daily. 7 tablet 0   levothyroxine  (SYNTHROID ) 25 MCG tablet Take 1 tablet by mouth once daily 90 tablet 0   losartan  (COZAAR ) 50 MG tablet Take 1 tablet (50 mg total) by mouth daily. 90 tablet 2   Magnesium  Chloride (MAG64) 64 MG TBEC Take 1 tablet (64 mg total) by mouth daily. 90 tablet 1   metFORMIN  (GLUCOPHAGE ) 500 MG tablet Take 2 tablets (1,000 mg total) by mouth 2 (two) times daily with a meal.     metoprolol  succinate (TOPROL -XL) 100 MG 24 hr tablet Take 1 tablet (100 mg total) by mouth daily. Take with or immediately following a meal. 90 tablet 2   nitroGLYCERIN  (NITROSTAT ) 0.4 MG SL tablet Place 1 tablet (0.4 mg total) under the tongue every 5 (five) minutes as needed for chest pain. DISSOLVE ONE TABLET UNDER THE TONGUE EVERY 5 TO 10 MINUTES PRIOR TO ACTIVITIES WHICH MIGHT PRECIPITATE AN ATTACK 25 tablet 0   omega-3 acid ethyl esters (LOVAZA ) 1 g capsule Take 2 capsules (2 g total) by mouth 2 (two) times daily. 360 capsule 0   Omega-3 Fatty Acids (FISH OIL ) 1000 MG CAPS Take 2 capsules (2,000 mg total) by mouth in the morning and at bedtime. 180  capsule 12   ONETOUCH ULTRA test strip USE 1 STRIP TO CHECK GLUCOSE IN THE MORNING AND 1 AT BEDTIME 100 each 0   oxyCODONE -acetaminophen  (PERCOCET/ROXICET) 5-325 MG tablet Take 1 tablet by mouth 2 (two) times daily as needed for severe pain (pain score 7-10). 60 tablet 0   pantoprazole  (PROTONIX ) 40 MG tablet Take 1 tablet by mouth once daily 90 tablet 0   predniSONE  (DELTASONE ) 10 MG tablet Take 1 tablet by mouth once daily with breakfast 30 tablet 2   RELION INSULIN  SYRINGE 31G X 15/64 0.3 ML MISC AS DIRECTED 100 each 0   revefenacin  (YUPELRI ) 175 MCG/3ML nebulizer solution Take 3 mLs (175 mcg total) by nebulization daily. 90 mL 11   rosuvastatin  (CRESTOR ) 20 MG tablet Take 1 tablet (20 mg total) by mouth daily. 90 tablet 3   sodium chloride  HYPERTONIC 3 % nebulizer solution 3 ml twice daily via nebulizer 180 mL 11   spironolactone  (ALDACTONE ) 25 MG tablet Take 1 tablet (25 mg total) by mouth daily. 90 tablet 3   tamsulosin  (FLOMAX ) 0.4 MG CAPS capsule TAKE 2 CAPSULES BY MOUTH ONCE DAILY AFTER SUPPER 180 capsule 0   torsemide  (DEMADEX ) 20 MG tablet Take 1 tablet (20 mg total) by mouth as needed (edema). 30 tablet 0   TOUJEO  SOLOSTAR 300 UNIT/ML Solostar Pen Inject 40 Units into the skin daily. Inject 40  units daily, increase by 2 units every 3 days until sugars are constantly 140 or lower. Max dose of 60 units daily. 18 mL 0   traZODone  (DESYREL ) 150 MG tablet Take 1 tablet by mouth once daily 270 tablet 0   VENTOLIN  HFA 108 (90 Base) MCG/ACT inhaler INHALE 2 PUFFS BY MOUTH EVERY 6 HOURS AS NEEDED FOR SHORTNESS OF BREATH FOR WHEEZING (Patient taking differently: Inhale 2 puffs into the lungs every 6 (six) hours as needed for shortness of breath or wheezing.) 18 g 0   Vitamin D , Ergocalciferol , (DRISDOL ) 1.25 MG (50000 UNIT) CAPS capsule TAKE 1 CAPSULE BY MOUTH TWICE A WEEK 12 capsule 3   Current Facility-Administered Medications  Medication Dose Route Frequency Provider Last Rate Last Admin    ipratropium-albuterol  (DUONEB) 0.5-2.5 (3) MG/3ML nebulizer solution 3 mL  3 mL Nebulization Once         REVIEW OF SYSTEMS:   [X]  denotes positive finding, [ ]  denotes negative finding Cardiac  Comments:  Chest pain or chest pressure:    Shortness of breath upon exertion:    Short of breath when lying flat:    Irregular heart rhythm:        Vascular    Pain in calf, thigh, or hip brought on by ambulation: x   Pain in feet at night that wakes you up from your sleep:     Blood clot in your veins:    Leg swelling:         Pulmonary    Oxygen  at home:    Productive cough:     Wheezing:         Neurologic    Sudden weakness in arms or legs:     Sudden numbness in arms or legs:     Sudden onset of difficulty speaking or slurred speech:    Temporary loss of vision in one eye:     Problems with dizziness:         Gastrointestinal    Blood in stool:      Vomited blood:         Genitourinary    Burning when urinating:     Blood in urine:        Psychiatric    Major depression:         Hematologic    Bleeding problems:    Problems with blood clotting too easily:        Skin    Rashes or ulcers:        Constitutional    Fever or chills:     PHYSICAL EXAM:   Vitals:   05/26/24 0910  BP: 128/71  Pulse: 83  Temp: 97.8 F (36.6 C)  SpO2: 92%  Weight: 196 lb (88.9 kg)  Height: 5' 10 (1.778 m)    GENERAL: The patient is a well-nourished male, in no acute distress. The vital signs are documented above. CARDIAC: There is a regular rate and rhythm.  VASCULAR: Nonpalpable pedal pulses PULMONARY: Nonlabored respirations MUSCULOSKELETAL: There are no major deformities or cyanosis. NEUROLOGIC: No focal weakness or paresthesias are detected. SKIN: There are no ulcers or rashes noted. PSYCHIATRIC: The patient has a normal affect.  STUDIES:   I have reviewed the following: +-------+-----------+-----------+------------+------------+  ABI/TBIToday's ABIToday's  TBIPrevious ABIPrevious TBI  +-------+-----------+-----------+------------+------------+  Right 0.58       0.58       0.96        0.66          +-------+-----------+-----------+------------+------------+  Left  0.82       0.53       0.82        0.56          +-------+-----------+-----------+------------+------------+    ASSESSMENT and PLAN   Lower extremity atherosclerotic vascular disease with claudication: The patient has a history of left iliac stenting.  His ABIs have dropped significantly today now 0.58 on the right and 0.82 on the left.  I suspect that his stent in his left  iliac system is either occluded or his highly stenotic.  I have recommended proceeding with angiography via left femoral approach with aortogram and bilateral runoff to evaluate his inflow and outflow disease.  I will try to intervene on the right iliac system if necessary.  He also understands that he is at risk for needing surgical intervention particularly if his symptoms get worse or if he develops a nonhealing wound.  He should continue his antiplatelet therapy and statin therapy.   Malvina Serene CLORE, MD, FACS [facility] [phone] Pager [phone]

## 2024-05-26 NOTE — H&P (View-Only) (Signed)
 [facility] of [facility]  Patient name: Don [name] MRN: [id number] DOB: 04/25/1951 Sex: male   REQUESTING PROVIDER:    Abigail Free   REASON FOR CONSULT:    PAD  HISTORY OF PRESENT ILLNESS:   Don [name] is a 73 y.o. male, who is referred for follow-up evaluation of his PAD.  I have not seen the patient since 2018.  He has a history of a left common iliac stent placed in [place]  in mid 2000's.  On 01/13/2015 I performed left external iliac stenting.  He is complaining that his legs will give out sometimes.  He also gets right leg cramping with walking.  He does not have any open wounds.  He complains that both feet are very cold   The patient is medically managed for hypertension.  He takes a statin for hypercholesterolemia.  He has COPD secondary to smoking.  He has a history of PCI for coronary artery disease.  He has been treated for throat cancer.  PAST MEDICAL HISTORY    Past Medical History:  Diagnosis Date   Abnormal chest CT 02/01/2024   Absence of bladder continence 01/08/2022   Acquired hypothyroidism 03/02/2024   Acute infection of nasal sinus 01/13/2023   Acute non-recurrent maxillary sinusitis 01/13/2023   Anxiety state 02/02/2022   Arthritis    Asymptomatic LV dysfunction 10/18/2017   Atherosclerosis of aorta (HCC) 01/13/2023   B12 deficiency anemia 08/25/2021   Basal cell carcinoma    Benign prostatic hyperplasia with incomplete bladder emptying 01/14/2019   Cancer (HCC)    throat - 1997, throat - 2018   Cardiomyopathy, secondary (HCC) 12/01/2016   Overview:  EF 47% 12/26/16   Chronic coronary artery disease 10/18/2017   Chronic cough 12/16/2023   Chronic ischemic heart disease 11/13/1998   Jan 22, 2003 Entered By: CHRISTOPHER OLIVE J Comment:  massive Mi in 2000 per patient hsitory, 2nd Mi in 2001Mar 11, 2004 Entered By: CHRISTOPHER OLIVE J Comment: Had stent placedin 2000,cath/angio in 2001    Chronic neck pain 05/16/2022   Chronic pain syndrome 06/04/2022   Chronic respiratory failure with hypoxia (HCC) 05/16/2022   COPD (chronic obstructive pulmonary disease) (HCC)    COPD exacerbation (HCC) 07/30/2023   Coronary artery disease involving native coronary artery of native heart with angina pectoris (HCC) 12/01/2016   Overview:  He has hx of IWMI in remote past, last cath in 2012 at [facility] showed chronic total occlusion of previously stented RCA, with good collaterals, a 40% LAD stenosis, inferior hypokinesis and EF 45%.    He's been lost to Cardiology f/u since 2015, but has not had any recurrent events   Deficiency anemia 08/25/2021   Diabetic polyneuropathy associated with type 2 diabetes mellitus (HCC) 05/16/2022   Dyslipidemia 12/01/2016   Encounter for Medicare annual wellness exam 01/13/2023   Erectile dysfunction due to diseases classified elsewhere 06/12/2019   GAD (generalized anxiety disorder) 02/02/2022   GERD (gastroesophageal reflux disease)    Hallucination 10/08/2022   Hyperlipidemia    Hypertension    Hypertensive heart disease with heart failure (HCC)    Insomnia 01/28/2021   Iron  deficiency 07/07/2023   Iron  deficiency anemia due to chronic blood loss 08/25/2021   Lesion of vocal cord    Leukoplakia of larynx 11/15/2016   Long term (current) use of systemic steroids 12/16/2023   Long-term use of aspirin  therapy 10/02/2018   Lumbar back pain 06/04/2022   Lung nodule 11/30/2023   Lung nodules 08/17/2022  Macular degeneration of both eyes 03/02/2024   Malfunction of penile prosthesis (HCC) 01/14/2019   Malignant neoplasm of skin 01/28/2021   Formatting of this note might be different from the original. Jan 22, 2003 Entered By: CHRISTOPHER OLIVE J Comment: of skin - removed x2 inpast Jan 22, 2003 Entered By: CHRISTOPHER OLIVE J Comment: of skin - removed x2 inpast   Melanoma of back (HCC)    melanoma on back   Memory loss 03/08/2020   Mixed simple and mucopurulent  chronic bronchitis (HCC) 03/02/2024   Moderate recurrent major depression (HCC) 03/02/2023   Mucopurulent chronic bronchitis (HCC) 04/08/2023   Oropharyngeal dysphagia 08/12/2018   Other ill-defined and unknown causes of morbidity and mortality 11/13/1993   Formatting of this note might be different from the original. 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   Throat cancer (HCC)    Tobacco use disorder 06/12/2019   Type 2 diabetes mellitus with hyperglycemia, with long-term current use of insulin  (HCC) 07/30/2023   URI, acute 05/31/2023   Urinary urgency 04/08/2023   Ventricular ectopy 11/09/2022   Vertigo 04/08/2023   Vitamin D  deficiency disease 01/13/2023     FAMILY HISTORY   Family History  Problem Relation Age of Onset   Cancer Mother        bone   Hypertension Mother    Stroke Father    Hypertension Father    Healthy Child    Cancer Other        brain   Diabetes Other     SOCIAL HISTORY:   Social History   Socioeconomic History   Marital status: Married    Spouse name: Not on file   Number of children: 4   Years of education: Not on file   Highest education level: GED or equivalent  Occupational History   Occupation: retired  Tobacco Use   Smoking status: Every Day    Current packs/day: 1.00    Average packs/day: 1 pack/day for 50.0 years (50.0 ttl pk-yrs)    Types: Cigarettes   Smokeless tobacco:  Never   Tobacco comments:    down to .5 ppd  11/02/2022 hfb  Vaping Use   Vaping status: Former  Substance and Sexual Activity   Alcohol use: Not on file   Drug use: No   Sexual activity: Not on file  Other Topics Concern   Not on file  Social History Narrative   Lives with wife       One level      Right hand   Social Drivers of Health   Financial Resource Strain: Low Risk  (04/29/2024)   Overall Financial Resource Strain (CARDIA)    Difficulty of Paying Living Expenses: Not hard at all  Food Insecurity: No Food Insecurity (04/29/2024)   Hunger Vital Sign    Worried About Running Out of Food in the Last Year: Never true    Ran Out of Food in  the Last Year: Never true  Transportation Needs: No Transportation Needs (04/29/2024)   PRAPARE - Administrator, Civil Service (Medical): No    Lack of Transportation (Non-Medical): No  Physical Activity: Inactive (04/29/2024)   Exercise Vital Sign    Days of Exercise per Week: 0 days    Minutes of Exercise per Session: Not on file  Stress: Stress Concern Present (04/29/2024)   [employer] of Occupational Health - Occupational Stress Questionnaire    Feeling of Stress: To some extent  Social Connections: Socially Isolated (04/29/2024)   Social Connection and Isolation Panel    Frequency of Communication with Friends and Family: Once a week    Frequency of Social Gatherings with Friends and Family: Never    Attends Religious Services: Never    Database administrator or Organizations: No    Attends Engineer, structural: Not on file    Marital Status: Married  Catering manager Violence: Not At Risk (01/11/2023)   Humiliation, Afraid, Rape, and Kick questionnaire    Fear of Current or Ex-Partner: No    Emotionally Abused: No    Physically Abused: No    Sexually Abused: No    ALLERGIES:    Allergies  Allergen Reactions   Penicillins Rash and Hives    Has patient had a PCN reaction causing immediate rash,  facial/tongue/throat swelling, SOB or lightheadedness with hypotension:unsure Has patient had a PCN reaction causing severe rash involving mucus membranes or skin necrosis:unsure Has patient had a PCN reaction that required hospitalization:No Has patient had a PCN reaction occurring within the last 10 years:No If all of the above answers are NO, then may proceed with Cephalosporin use.  rash   Bactrim  [Sulfamethoxazole -Trimethoprim ]     Dizziness, confusion, involuntary movements   Iodinated Contrast Media Rash    Also developed blisters   Penicillin G Rash   Tramadol Rash    CURRENT MEDICATIONS:    Current Outpatient Medications  Medication Sig Dispense Refill   ALPRAZolam  (XANAX ) 0.25 MG tablet Take 1 tablet by mouth twice daily as needed for anxiety 60 tablet 2   arformoterol  (BROVANA ) 15 MCG/2ML NEBU Take 2 mLs (15 mcg total) by nebulization 2 (two) times daily. 120 mL 6   budesonide  (PULMICORT ) 0.25 MG/2ML nebulizer solution Take 2 mLs (0.25 mg total) by nebulization in the morning and at bedtime. 60 mL 11   busPIRone  (BUSPAR ) 7.5 MG tablet Take 1 tablet (7.5 mg total) by mouth 2 (two) times daily. 60 tablet 2   clopidogrel  (PLAVIX ) 75 MG tablet Take 1 tablet by mouth once daily 90 tablet 0   cyanocobalamin  (VITAMIN B12) 1000 MCG/ML injection INJECT 1 ML (CC) INTRAMUSCULARLY ONCE A WEEK (Patient taking differently: Inject 100 mcg into the muscle every 30 (thirty) days.) 10 mL 0   cyclobenzaprine  (FLEXERIL ) 10 MG tablet Take 1 tablet by mouth three times daily as needed for muscle spasm 90 tablet 0   doxycycline  (VIBRA -TABS) 100 MG tablet Take 1 tablet (100 mg total) by mouth 2 (two) times daily. 20 tablet 0   DULoxetine  (CYMBALTA ) 60 MG capsule Take 1 capsule (60 mg total) by mouth 2 (two) times daily. 180 capsule 0   Dupilumab  (DUPIXENT ) 300 MG/2ML SOAJ Inject 300 mg into the skin every 14 (fourteen) days. 12 mL 1   fenofibrate  160 MG tablet Take 1 tablet by mouth once daily 90  tablet 0   finasteride  (PROSCAR ) 5 MG tablet Take 1 tablet by mouth once daily  90 tablet 0   furosemide  (LASIX ) 20 MG tablet Take 1 tablet (20 mg total) by mouth daily. 90 tablet 1   gabapentin  (NEURONTIN ) 400 MG capsule TAKE 1 CAPSULE BY MOUTH THREE TIMES DAILY 270 capsule 1   insulin  lispro (HUMALOG ) 100 UNIT/ML injection Inject 0.04 mLs (4 Units total) into the skin 3 (three) times daily with meals. 10 mL 11   Insulin  Pen Needle (PEN NEEDLES) 31G X 8 MM MISC Inject insulin  as directed 100 each 1   ipratropium-albuterol  (DUONEB) 0.5-2.5 (3) MG/3ML SOLN USE 1 VIAL IN NEBULIZER 4 TIMES DAILY 360 mL 0   Lancets (ONETOUCH DELICA PLUS LANCET33G) MISC USE 1  TO CHECK GLUCOSE THREE TIMES DAILY (Patient taking differently: 1 each by Other route 3 (three) times daily.) 100 each 0   levofloxacin  (LEVAQUIN ) 750 MG tablet Take 1 tablet (750 mg total) by mouth daily. 7 tablet 0   levothyroxine  (SYNTHROID ) 25 MCG tablet Take 1 tablet by mouth once daily 90 tablet 0   losartan  (COZAAR ) 50 MG tablet Take 1 tablet (50 mg total) by mouth daily. 90 tablet 2   Magnesium  Chloride (MAG64) 64 MG TBEC Take 1 tablet (64 mg total) by mouth daily. 90 tablet 1   metFORMIN  (GLUCOPHAGE ) 500 MG tablet Take 2 tablets (1,000 mg total) by mouth 2 (two) times daily with a meal.     metoprolol  succinate (TOPROL -XL) 100 MG 24 hr tablet Take 1 tablet (100 mg total) by mouth daily. Take with or immediately following a meal. 90 tablet 2   nitroGLYCERIN  (NITROSTAT ) 0.4 MG SL tablet Place 1 tablet (0.4 mg total) under the tongue every 5 (five) minutes as needed for chest pain. DISSOLVE ONE TABLET UNDER THE TONGUE EVERY 5 TO 10 MINUTES PRIOR TO ACTIVITIES WHICH MIGHT PRECIPITATE AN ATTACK 25 tablet 0   omega-3 acid ethyl esters (LOVAZA ) 1 g capsule Take 2 capsules (2 g total) by mouth 2 (two) times daily. 360 capsule 0   Omega-3 Fatty Acids (FISH OIL ) 1000 MG CAPS Take 2 capsules (2,000 mg total) by mouth in the morning and at bedtime. 180  capsule 12   ONETOUCH ULTRA test strip USE 1 STRIP TO CHECK GLUCOSE IN THE MORNING AND 1 AT BEDTIME 100 each 0   oxyCODONE -acetaminophen  (PERCOCET/ROXICET) 5-325 MG tablet Take 1 tablet by mouth 2 (two) times daily as needed for severe pain (pain score 7-10). 60 tablet 0   pantoprazole  (PROTONIX ) 40 MG tablet Take 1 tablet by mouth once daily 90 tablet 0   predniSONE  (DELTASONE ) 10 MG tablet Take 1 tablet by mouth once daily with breakfast 30 tablet 2   RELION INSULIN  SYRINGE 31G X 15/64 0.3 ML MISC AS DIRECTED 100 each 0   revefenacin  (YUPELRI ) 175 MCG/3ML nebulizer solution Take 3 mLs (175 mcg total) by nebulization daily. 90 mL 11   rosuvastatin  (CRESTOR ) 20 MG tablet Take 1 tablet (20 mg total) by mouth daily. 90 tablet 3   sodium chloride  HYPERTONIC 3 % nebulizer solution 3 ml twice daily via nebulizer 180 mL 11   spironolactone  (ALDACTONE ) 25 MG tablet Take 1 tablet (25 mg total) by mouth daily. 90 tablet 3   tamsulosin  (FLOMAX ) 0.4 MG CAPS capsule TAKE 2 CAPSULES BY MOUTH ONCE DAILY AFTER SUPPER 180 capsule 0   torsemide  (DEMADEX ) 20 MG tablet Take 1 tablet (20 mg total) by mouth as needed (edema). 30 tablet 0   TOUJEO  SOLOSTAR 300 UNIT/ML Solostar Pen Inject 40 Units into the skin daily. Inject 40  units daily, increase by 2 units every 3 days until sugars are constantly 140 or lower. Max dose of 60 units daily. 18 mL 0   traZODone  (DESYREL ) 150 MG tablet Take 1 tablet by mouth once daily 270 tablet 0   VENTOLIN  HFA 108 (90 Base) MCG/ACT inhaler INHALE 2 PUFFS BY MOUTH EVERY 6 HOURS AS NEEDED FOR SHORTNESS OF BREATH FOR WHEEZING (Patient taking differently: Inhale 2 puffs into the lungs every 6 (six) hours as needed for shortness of breath or wheezing.) 18 g 0   Vitamin D , Ergocalciferol , (DRISDOL ) 1.25 MG (50000 UNIT) CAPS capsule TAKE 1 CAPSULE BY MOUTH TWICE A WEEK 12 capsule 3   Current Facility-Administered Medications  Medication Dose Route Frequency Provider Last Rate Last Admin    ipratropium-albuterol  (DUONEB) 0.5-2.5 (3) MG/3ML nebulizer solution 3 mL  3 mL Nebulization Once         REVIEW OF SYSTEMS:   [X]  denotes positive finding, [ ]  denotes negative finding Cardiac  Comments:  Chest pain or chest pressure:    Shortness of breath upon exertion:    Short of breath when lying flat:    Irregular heart rhythm:        Vascular    Pain in calf, thigh, or hip brought on by ambulation: x   Pain in feet at night that wakes you up from your sleep:     Blood clot in your veins:    Leg swelling:         Pulmonary    Oxygen  at home:    Productive cough:     Wheezing:         Neurologic    Sudden weakness in arms or legs:     Sudden numbness in arms or legs:     Sudden onset of difficulty speaking or slurred speech:    Temporary loss of vision in one eye:     Problems with dizziness:         Gastrointestinal    Blood in stool:      Vomited blood:         Genitourinary    Burning when urinating:     Blood in urine:        Psychiatric    Major depression:         Hematologic    Bleeding problems:    Problems with blood clotting too easily:        Skin    Rashes or ulcers:        Constitutional    Fever or chills:     PHYSICAL EXAM:   Vitals:   05/26/24 0910  BP: 128/71  Pulse: 83  Temp: 97.8 F (36.6 C)  SpO2: 92%  Weight: 196 lb (88.9 kg)  Height: 5' 10 (1.778 m)    GENERAL: The patient is a well-nourished male, in no acute distress. The vital signs are documented above. CARDIAC: There is a regular rate and rhythm.  VASCULAR: Nonpalpable pedal pulses PULMONARY: Nonlabored respirations MUSCULOSKELETAL: There are no major deformities or cyanosis. NEUROLOGIC: No focal weakness or paresthesias are detected. SKIN: There are no ulcers or rashes noted. PSYCHIATRIC: The patient has a normal affect.  STUDIES:   I have reviewed the following: +-------+-----------+-----------+------------+------------+  ABI/TBIToday's ABIToday's  TBIPrevious ABIPrevious TBI  +-------+-----------+-----------+------------+------------+  Right 0.58       0.58       0.96        0.66          +-------+-----------+-----------+------------+------------+  Left  0.82       0.53       0.82        0.56          +-------+-----------+-----------+------------+------------+    ASSESSMENT and PLAN   Lower extremity atherosclerotic vascular disease with claudication: The patient has a history of left iliac stenting.  His ABIs have dropped significantly today now 0.58 on the right and 0.82 on the left.   I have recommended proceeding with angiography via left femoral approach with aortogram and bilateral runoff to evaluate his inflow and outflow disease.  I will try to intervene on the right iliac system if necessary.  He also understands that he is at risk for needing surgical intervention particularly if his symptoms get worse or if he develops a nonhealing wound.  He should continue his antiplatelet therapy and statin therapy.   Malvina Serene CLORE, MD, FACS [facility] [phone] Pager [phone]

## 2024-05-27 ENCOUNTER — Other Ambulatory Visit: Payer: Self-pay

## 2024-05-27 ENCOUNTER — Ambulatory Visit (HOSPITAL_COMMUNITY)
Admission: EM | Admit: 2024-05-27 | Discharge: 2024-05-27 | Disposition: A | Discharge status: Home / Self Care | Source: Ambulatory Visit | Attending: Surgery | Admitting: Surgery

## 2024-05-27 ENCOUNTER — Encounter (HOSPITAL_COMMUNITY)
Admission: EM | Disposition: A | Discharge status: Home / Self Care | Payer: Self-pay | Source: Ambulatory Visit | Attending: Surgery

## 2024-05-27 DIAGNOSIS — I509 Heart failure, unspecified: Secondary | ICD-10-CM | POA: Diagnosis not present

## 2024-05-27 DIAGNOSIS — E7800 Pure hypercholesterolemia, unspecified: Secondary | ICD-10-CM | POA: Diagnosis not present

## 2024-05-27 DIAGNOSIS — Z95828 Presence of other vascular implants and grafts: Secondary | ICD-10-CM | POA: Insufficient documentation

## 2024-05-27 DIAGNOSIS — I110 Hypertensive heart disease with heart failure: Secondary | ICD-10-CM | POA: Diagnosis not present

## 2024-05-27 DIAGNOSIS — I70211 Atherosclerosis of native arteries of extremities with intermittent claudication, right leg: Secondary | ICD-10-CM | POA: Insufficient documentation

## 2024-05-27 DIAGNOSIS — I70213 Atherosclerosis of native arteries of extremities with intermittent claudication, bilateral legs: Secondary | ICD-10-CM | POA: Diagnosis not present

## 2024-05-27 DIAGNOSIS — J449 Chronic obstructive pulmonary disease, unspecified: Secondary | ICD-10-CM | POA: Diagnosis not present

## 2024-05-27 DIAGNOSIS — Z794 Long term (current) use of insulin: Secondary | ICD-10-CM | POA: Diagnosis not present

## 2024-05-27 DIAGNOSIS — Z955 Presence of coronary angioplasty implant and graft: Secondary | ICD-10-CM | POA: Insufficient documentation

## 2024-05-27 DIAGNOSIS — Z79899 Other long term (current) drug therapy: Secondary | ICD-10-CM | POA: Diagnosis not present

## 2024-05-27 DIAGNOSIS — F17210 Nicotine dependence, cigarettes, uncomplicated: Secondary | ICD-10-CM | POA: Insufficient documentation

## 2024-05-27 DIAGNOSIS — E1142 Type 2 diabetes mellitus with diabetic polyneuropathy: Secondary | ICD-10-CM | POA: Diagnosis not present

## 2024-05-27 DIAGNOSIS — Z7984 Long term (current) use of oral hypoglycemic drugs: Secondary | ICD-10-CM | POA: Diagnosis not present

## 2024-05-27 DIAGNOSIS — E1151 Type 2 diabetes mellitus with diabetic peripheral angiopathy without gangrene: Secondary | ICD-10-CM | POA: Diagnosis not present

## 2024-05-27 HISTORY — PX: LOWER EXTREMITY INTERVENTION: CATH118252

## 2024-05-27 HISTORY — PX: LOWER EXTREMITY ANGIOGRAPHY: CATH118251

## 2024-05-27 HISTORY — PX: ABDOMINAL AORTOGRAM W/LOWER EXTREMITY: CATH118223

## 2024-05-27 LAB — POCT I-STAT, CHEM 8
BUN: 14 mg/dL (ref 8–23)
Calcium, Ion: 1.2 mmol/L (ref 1.15–1.40)
Chloride: 99 mmol/L (ref 98–111)
Creatinine, Ser: 0.9 mg/dL (ref 0.61–1.24)
Glucose, Bld: 312 mg/dL — ABNORMAL HIGH (ref 70–99)
HCT: 45 % (ref 39.0–52.0)
Hemoglobin: 15.3 g/dL (ref 13.0–17.0)
Potassium: 4 mmol/L (ref 3.5–5.1)
Sodium: 140 mmol/L (ref 135–145)
TCO2: 29 mmol/L (ref 22–32)

## 2024-05-27 LAB — GLUCOSE, CAPILLARY: Glucose-Capillary: 317 mg/dL — ABNORMAL HIGH (ref 70–99)

## 2024-05-27 MED ORDER — HEPARIN SODIUM (PORCINE) 1000 UNIT/ML IJ SOLN
INTRAMUSCULAR | Status: AC
  Filled 2024-05-27: qty 10

## 2024-05-27 MED ORDER — LIDOCAINE HCL (PF) 1 % IJ SOLN
INTRAMUSCULAR | Status: DC | PRN
  Administered 2024-05-27: 5 mL

## 2024-05-27 MED ORDER — ASPIRIN 81 MG PO TBEC: 81.0000 mg | DELAYED_RELEASE_TABLET | Freq: Every day | ORAL | 2 refills | Status: DC

## 2024-05-27 MED ORDER — OXYCODONE HCL 5 MG PO TABS: 5.0000 mg | ORAL_TABLET | ORAL | Status: DC | PRN

## 2024-05-27 MED ORDER — SODIUM CHLORIDE 0.9% FLUSH: 3.0000 mL | INTRAVENOUS | Status: DC | PRN

## 2024-05-27 MED ORDER — SODIUM CHLORIDE 0.9 % WEIGHT BASED INFUSION: 1.0000 mL/kg/h | INTRAVENOUS | Status: DC

## 2024-05-27 MED ORDER — CLOPIDOGREL BISULFATE 75 MG PO TABS: 75.0000 mg | ORAL_TABLET | Freq: Every day | ORAL | Status: DC

## 2024-05-27 MED ORDER — LIDOCAINE HCL (PF) 1 % IJ SOLN
INTRAMUSCULAR | Status: AC
  Filled 2024-05-27: qty 30

## 2024-05-27 MED ORDER — METHYLPREDNISOLONE SODIUM SUCC 125 MG IJ SOLR
INTRAMUSCULAR | Status: DC | PRN
  Administered 2024-05-27: 125 mg via INTRAVENOUS

## 2024-05-27 MED ORDER — SODIUM CHLORIDE 0.9 % IV SOLN
250.0000 mL | INTRAVENOUS | Status: DC | PRN
Start: 2024-05-28 — End: 2024-05-28

## 2024-05-27 MED ORDER — SODIUM CHLORIDE 0.9 % IV SOLN: INTRAVENOUS | Status: DC

## 2024-05-27 MED ORDER — HEPARIN SODIUM (PORCINE) 1000 UNIT/ML IJ SOLN
INTRAMUSCULAR | Status: DC | PRN
  Administered 2024-05-27: 8000 [IU] via INTRAVENOUS

## 2024-05-27 MED ORDER — CLOPIDOGREL BISULFATE 75 MG PO TABS
ORAL_TABLET | ORAL | Status: DC | PRN
Start: 2024-05-27 — End: 2024-05-28
  Administered 2024-05-27: 75 mg via ORAL

## 2024-05-27 MED ORDER — FENTANYL CITRATE (PF) 100 MCG/2ML IJ SOLN
INTRAMUSCULAR | Status: AC
  Filled 2024-05-27: qty 2

## 2024-05-27 MED ORDER — ASPIRIN 81 MG PO TBEC: 81.0000 mg | DELAYED_RELEASE_TABLET | Freq: Every day | ORAL | Status: DC

## 2024-05-27 MED ORDER — INSULIN ASPART 100 UNIT/ML IJ SOLN
8.0000 [IU] | Freq: Once | INTRAMUSCULAR | Status: AC
  Administered 2024-05-27: 8 [IU] via SUBCUTANEOUS
  Filled 2024-05-27: qty 1

## 2024-05-27 MED ORDER — SODIUM CHLORIDE 0.9% FLUSH: 3.0000 mL | Freq: Two times a day (BID) | INTRAVENOUS | Status: DC

## 2024-05-27 MED ORDER — HYDRALAZINE HCL 20 MG/ML IJ SOLN: 5.0000 mg | INTRAMUSCULAR | Status: DC | PRN

## 2024-05-27 MED ORDER — MIDAZOLAM HCL 2 MG/2ML IJ SOLN
INTRAMUSCULAR | Status: DC | PRN
  Administered 2024-05-27: 1 mg via INTRAVENOUS

## 2024-05-27 MED ORDER — HEPARIN (PORCINE) IN NACL 1000-0.9 UT/500ML-% IV SOLN
INTRAVENOUS | Status: DC | PRN
Start: 2024-05-27 — End: 2024-05-28
  Administered 2024-05-27: 1000 mL

## 2024-05-27 MED ORDER — MORPHINE SULFATE (PF) 2 MG/ML IV SOLN: 2.0000 mg | INTRAVENOUS | Status: DC | PRN

## 2024-05-27 MED ORDER — DIPHENHYDRAMINE HCL 50 MG/ML IJ SOLN
INTRAMUSCULAR | Status: DC | PRN
  Administered 2024-05-27: 50 mg via INTRAVENOUS

## 2024-05-27 MED ORDER — ACETAMINOPHEN 325 MG PO TABS: 650.0000 mg | ORAL_TABLET | ORAL | Status: DC | PRN

## 2024-05-27 MED ORDER — ONDANSETRON HCL 4 MG/2ML IJ SOLN: 4.0000 mg | Freq: Four times a day (QID) | INTRAMUSCULAR | Status: DC | PRN

## 2024-05-27 MED ORDER — IODIXANOL 320 MG/ML IV SOLN
INTRAVENOUS | Status: DC | PRN
  Administered 2024-05-27: 140 mL

## 2024-05-27 MED ORDER — LABETALOL HCL 5 MG/ML IV SOLN: 10.0000 mg | INTRAVENOUS | Status: DC | PRN

## 2024-05-27 MED ORDER — ASPIRIN 81 MG PO CHEW
CHEWABLE_TABLET | ORAL | Status: DC | PRN
  Administered 2024-05-27: 81 mg via ORAL

## 2024-05-27 MED ORDER — MIDAZOLAM HCL 2 MG/2ML IJ SOLN
INTRAMUSCULAR | Status: AC
  Filled 2024-05-27: qty 2

## 2024-05-27 MED ORDER — FENTANYL CITRATE (PF) 100 MCG/2ML IJ SOLN
INTRAMUSCULAR | Status: DC | PRN
  Administered 2024-05-27: 50 ug via INTRAVENOUS

## 2024-05-27 NOTE — Discharge Instructions (Signed)
NO METFORMIN FOR 2 DAYS 

## 2024-05-27 NOTE — Op Note (Signed)
 Patient name: Don [name] MRN: [id number] DOB: 09-12-1951 Sex: male  05/27/2024 Pre-operative Diagnosis: Right leg claudication Post-operative diagnosis:  Same Surgeon:  Don [name] Procedure Performed:  1.  Ultrasound-guided access, left femoral artery  2.  Abdominal aortogram  3.  Bilateral lower extremity angiogram  4.  Selective injection with catheter in right common iliac artery  5.  Stent, right common iliac artery  6.  Conscious sedation, 39 minutes  7.  Closure place, Celt   Indications: This is a 73 year old gentleman with history of left iliac stenting in the remote past.  He is now having right leg symptoms.  He comes in today for angiography of further evaluation with possible invention.  Procedure:  The patient was identified in the holding area and taken to room 8.  The patient was then placed supine on the table and prepped and draped in the usual sterile fashion.  A time out was called.  Conscious sedation was administered with the use of IV fentanyl  and Versed  under continuous physician and nurse monitoring.  Heart rate, blood pressure, and oxygen  saturation were continuously monitored.  Total sedation time was 39 minutes.  Ultrasound was used to evaluate the left common femoral artery.  It was patent .  A digital ultrasound image was acquired.  A micropuncture needle was used to access the left common femoral artery under ultrasound guidance.  An 018 wire was advanced without resistance and a micropuncture sheath was placed.  The 018 wire was removed and a benson wire was placed.  The micropuncture sheath was exchanged for a 5 french sheath.  An omniflush catheter was advanced over the wire to the level of L-1.  An abdominal angiogram was obtained.  Next, the cath was pulled down to the aortic bifurcation and multiple pelvic obliquity images were obtained.  This was followed by bilateral runoff.    Findings:   Aortogram: Possible right renal artery stenosis.  The  left renal artery is widely patent.  The infrarenal abdominal aorta is patent without significant stenosis.  The stents within the left iliac system are widely patent.  There is a 80% right common iliac stenosis.  The right internal iliac and external iliac arteries are widely patent.  Right Lower Extremity: The right common femoral and profundofemoral artery are widely patent.  The superficial femoral is patent however there are multiple greater than 50% lesions.  The popliteal artery has mild to moderate disease.  Tibial vessels were difficult to evaluate secondary to timing of contrast  Left Lower Extremity: The left common femoral profundofemoral artery widely patent.  The superficial femoral artery is patent with mild to moderate stenosis.  The popliteal artery is patent throughout its course.  Tibial vessels were difficult to evaluate secondary to contrast timing.  Intervention: After the above images were acquired the decision was made to proceed with intervention.  Using an Omni Flush cath and a Bentson wire the aortic bifurcation was crossed.  An Amplatz wire was then inserted.  I then placed a 7 x 45 cm sheath into the right common iliac artery.  The patient was fully heparinized.  Additional images were performed within the common iliac sheath to best identify the lesion.  I first deployed a 9 x 29 Gore VBX balloon expandable stent landing in the proximal common iliac artery.  I then placed a second 9 x 39 VBX and landed just proximal to the hypogastric artery origin.  Completion imaging showed resolution of the stenosis.  I was satisfied with these results.  The groin was closed with a Celt  Impression:  #1  Widely patent left iliac stents  #2  80% right common iliac artery stenosis successfully treated with overlapping 9 mm VBX stents  #3  Diffuse femoral-popliteal disease bilaterally right greater than left    V. Don [name], M.D., [facility]  [place] Office: [phone] Pager:  [phone]

## 2024-05-27 NOTE — Interval H&P Note (Signed)
 History and Physical Interval Note:  05/27/2024 2:53 PM  [name]  has presented today for surgery, with the diagnosis of atherosclerosis, bilateral lower extremities..  The various methods of treatment have been discussed with the patient and family. After consideration of risks, benefits and other options for treatment, the patient has consented to  Procedure(s): ABDOMINAL AORTOGRAM W/LOWER EXTREMITY (N/A) Lower Extremity Angiography (N/A) LOWER EXTREMITY INTERVENTION (N/A) as a surgical intervention.  The patient's history has been reviewed, patient examined, no change in status, stable for surgery.  I have reviewed the patient's chart and labs.  Questions were answered to the patient's satisfaction.     Don [name]

## 2024-05-28 ENCOUNTER — Other Ambulatory Visit: Payer: Self-pay | Admitting: Physician Assistant

## 2024-05-28 ENCOUNTER — Encounter (HOSPITAL_COMMUNITY): Payer: Self-pay | Admitting: Surgery

## 2024-05-28 DIAGNOSIS — J9611 Chronic respiratory failure with hypoxia: Secondary | ICD-10-CM

## 2024-05-28 DIAGNOSIS — J418 Mixed simple and mucopurulent chronic bronchitis: Secondary | ICD-10-CM

## 2024-06-01 ENCOUNTER — Other Ambulatory Visit: Payer: Self-pay | Admitting: Family Medicine

## 2024-06-01 DIAGNOSIS — F411 Generalized anxiety disorder: Secondary | ICD-10-CM

## 2024-06-02 ENCOUNTER — Encounter: Payer: Self-pay | Admitting: Family Medicine

## 2024-06-03 ENCOUNTER — Other Ambulatory Visit: Payer: Self-pay | Admitting: Family Medicine

## 2024-06-03 ENCOUNTER — Ambulatory Visit (INDEPENDENT_AMBULATORY_CARE_PROVIDER_SITE_OTHER): Admitting: Physician Assistant

## 2024-06-03 ENCOUNTER — Encounter: Payer: Self-pay | Admitting: Physician Assistant

## 2024-06-03 ENCOUNTER — Other Ambulatory Visit: Payer: Self-pay | Admitting: Cardiology

## 2024-06-03 VITALS — BP 130/64 | HR 64 | Temp 97.8°F | Resp 18 | Ht 70.0 in | Wt 196.4 lb

## 2024-06-03 DIAGNOSIS — H9221 Otorrhagia, right ear: Secondary | ICD-10-CM

## 2024-06-03 DIAGNOSIS — Z9181 History of falling: Secondary | ICD-10-CM | POA: Insufficient documentation

## 2024-06-03 DIAGNOSIS — R109 Unspecified abdominal pain: Secondary | ICD-10-CM

## 2024-06-03 DIAGNOSIS — R1030 Lower abdominal pain, unspecified: Secondary | ICD-10-CM

## 2024-06-03 DIAGNOSIS — R413 Other amnesia: Secondary | ICD-10-CM

## 2024-06-03 HISTORY — DX: Unspecified abdominal pain: R10.9

## 2024-06-03 HISTORY — DX: History of falling: Z91.81

## 2024-06-03 HISTORY — DX: Otorrhagia, right ear: H92.21

## 2024-06-03 MED ORDER — OXYCODONE-ACETAMINOPHEN 5-325 MG PO TABS: 1.0000 | ORAL_TABLET | Freq: Two times a day (BID) | ORAL | 0 refills | Status: DC | PRN

## 2024-06-03 NOTE — Assessment & Plan Note (Signed)
 Recent confusion and hallucinations, possibly due to mini-strokes. Blood pressure fluctuations noted. Oxygen  levels stable. - Order CT scan of the brain without contrast. - Perform stat blood work. - Monitor for new or worsening symptoms.

## 2024-06-03 NOTE — Progress Notes (Signed)
 Subjective:  Patient ID: Don [name], male    DOB: Oct 13, 1951  Age: 73 y.o. MRN: [id number]  Chief Complaint  Patient presents with   Abdominal Pain    HPI:  Discussed the use of AI scribe software for clinical note transcription with the patient, who gave verbal consent to proceed.  History of Present Illness   Don [name] is a 73 year old male who presents with worsening abdominal pain after a recent procedure. He is accompanied by his wife.  He has been experiencing worsening abdominal pain since undergoing a procedure on May 27, 2024. The pain is described as a deep ache in the lower abdomen, occasionally shifting location, and is persistent throughout the day. The pain is persistent throughout the day and is exacerbated during bowel movements, which have been infrequent since the procedure, with the last one occurring two days ago. He reports that eating sometimes makes the pain better. He reports dark stools since the surgery.  He has been taking aspirin  daily since the procedure, except for today. He has not tried any specific treatments for the pain, such as heat application or over-the-counter medications, except for occasionally using Pepto-Bismol, which he reports sometimes makes him feel better.  His wife notes recent cognitive changes, including confusion and hallucinations, such as seeing things that are not present. He fell two days ago but does not recall the incident. There was blood noted in his ear this morning, which was not present previously. She also reports episodes of elevated heart rate and blood pressure, as well as a preference for a fan due to feeling hot, which is unusual for him.  He has a history of shingles affecting his buttocks and leg, with residual lesions that are painful until scratched off. He also mentions a hernia, which has not been further elaborated upon in this visit.  No numbness or discoloration around genitals or legs. Reports  dark stools and pain during bowel movements. No significant changes in pain with eating. No major blood in the stool. No blood in the urine. Reports confusion and hallucinations recently. No slurred speech, but difficulty getting words out. No weakness on either side of the body.         02/28/2024    9:44 AM 11/29/2023    9:38 AM 05/31/2023   10:38 AM 04/03/2023    4:09 PM 03/02/2023    7:45 AM  Depression screen PHQ 2/9  Decreased Interest 1 2 2  0 1  Down, Depressed, Hopeless 0 2 0 0 2  PHQ - 2 Score 1 4 2  0 3  Altered sleeping 0 2 2  3   Tired, decreased energy 1 2 0  3  Change in appetite 0 0 2  0  Feeling bad or failure about yourself  0 0 0  0  Trouble concentrating 0 1 1  0  Moving slowly or fidgety/restless 0 0 0  0  Suicidal thoughts 0 0 0  0  PHQ-9 Score 2 9 7  9   Difficult doing work/chores Not difficult at all Somewhat difficult Very difficult  Somewhat difficult        02/28/2024    9:44 AM  Fall Risk   Falls in the past year? 1  Number falls in past yr: 1  Injury with Fall? 1  Risk for fall due to : History of fall(s)  Follow up Falls evaluation completed    Patient Care Team: Sherre Clapper, MD as PCP - General (Family  Medicine) Monetta Redell PARAS, MD as Referring Physician (Cardiology) Roark Rush, MD as Consulting Physician (Otolaryngology) Nyle Rankin POUR, [facility] (Inactive) as Pharmacist (Pharmacist)   Review of Systems  Constitutional:  Negative for appetite change, fatigue and fever.  HENT:  Negative for congestion, ear pain, sinus pressure and sore throat.   Eyes: Negative.   Respiratory:  Negative for cough, chest tightness, shortness of breath and wheezing.   Cardiovascular:  Negative for chest pain and palpitations.  Gastrointestinal:  Negative for abdominal pain, constipation, diarrhea, nausea and vomiting.  Endocrine: Negative.   Genitourinary:  Negative for dysuria, frequency, hematuria and urgency.  Musculoskeletal:  Negative for arthralgias, back  pain, joint swelling and myalgias.  Skin:  Negative for rash.  Allergic/Immunologic: Negative.   Neurological:  Negative for dizziness, weakness and headaches.  Hematological: Negative.   Psychiatric/Behavioral:  Negative for dysphoric mood. The patient is not nervous/anxious.     Current Outpatient Medications on File Prior to Visit  Medication Sig Dispense Refill   ALPRAZolam  (XANAX ) 0.25 MG tablet Take 1 tablet by mouth twice daily as needed for anxiety 60 tablet 2   arformoterol  (BROVANA ) 15 MCG/2ML NEBU Take 2 mLs (15 mcg total) by nebulization 2 (two) times daily. 120 mL 6   aspirin  EC 81 MG tablet Take 1 tablet (81 mg total) by mouth daily. Swallow whole. 150 tablet 2   budesonide  (PULMICORT ) 0.25 MG/2ML nebulizer solution Take 2 mLs (0.25 mg total) by nebulization in the morning and at bedtime. 60 mL 11   busPIRone  (BUSPAR ) 7.5 MG tablet Take 1 tablet by mouth twice daily 60 tablet 3   clopidogrel  (PLAVIX ) 75 MG tablet Take 1 tablet by mouth once daily 90 tablet 0   cyanocobalamin  (VITAMIN B12) 1000 MCG/ML injection INJECT 1 ML (CC) INTRAMUSCULARLY ONCE A WEEK 10 mL 0   cyclobenzaprine  (FLEXERIL ) 10 MG tablet Take 1 tablet by mouth three times daily as needed for muscle spasm (Patient taking differently: Take 10 mg by mouth 3 (three) times daily as needed for muscle spasms.) 90 tablet 0   DULoxetine  (CYMBALTA ) 60 MG capsule Take 1 capsule (60 mg total) by mouth 2 (two) times daily. 180 capsule 0   Dupilumab  (DUPIXENT ) 300 MG/2ML SOAJ Inject 300 mg into the skin every 14 (fourteen) days. 12 mL 1   finasteride  (PROSCAR ) 5 MG tablet Take 1 tablet by mouth once daily 90 tablet 0   furosemide  (LASIX ) 20 MG tablet Take 1 tablet (20 mg total) by mouth daily. 90 tablet 1   gabapentin  (NEURONTIN ) 400 MG capsule TAKE 1 CAPSULE BY MOUTH THREE TIMES DAILY 270 capsule 1   insulin  lispro (HUMALOG ) 100 UNIT/ML injection Inject 0.04 mLs (4 Units total) into the skin 3 (three) times daily with meals.  (Patient taking differently: Inject 5 Units into the skin 3 (three) times daily as needed for high blood sugar (higher than 400 give 5 units).) 10 mL 11   Insulin  Pen Needle (PEN NEEDLES) 31G X 8 MM MISC Inject insulin  as directed 100 each 1   ipratropium-albuterol  (DUONEB) 0.5-2.5 (3) MG/3ML SOLN USE 1 VIAL IN NEBULIZER 4 TIMES DAILY (Patient taking differently: Take 3 mLs by nebulization 2 (two) times daily.) 360 mL 0   Lancets (ONETOUCH DELICA PLUS LANCET33G) MISC USE 1  TO CHECK GLUCOSE THREE TIMES DAILY (Patient taking differently: 1 each by Other route 3 (three) times daily.) 100 each 0   levothyroxine  (SYNTHROID ) 25 MCG tablet Take 1 tablet by mouth once daily 90 tablet 0  losartan  (COZAAR ) 50 MG tablet Take 1 tablet (50 mg total) by mouth daily. 90 tablet 2   Magnesium  Chloride (MAG64) 64 MG TBEC Take 1 tablet (64 mg total) by mouth daily. 90 tablet 1   metFORMIN  (GLUCOPHAGE ) 1000 MG tablet Take 1,000 mg by mouth 2 (two) times daily.     metFORMIN  (GLUCOPHAGE ) 500 MG tablet Take 2 tablets (1,000 mg total) by mouth 2 (two) times daily with a meal.     metoprolol  succinate (TOPROL -XL) 100 MG 24 hr tablet Take 1 tablet (100 mg total) by mouth daily. Take with or immediately following a meal. 90 tablet 2   nitroGLYCERIN  (NITROSTAT ) 0.4 MG SL tablet Place 1 tablet (0.4 mg total) under the tongue every 5 (five) minutes as needed for chest pain. DISSOLVE ONE TABLET UNDER THE TONGUE EVERY 5 TO 10 MINUTES PRIOR TO ACTIVITIES WHICH MIGHT PRECIPITATE AN ATTACK 25 tablet 0   Omega-3 Fatty Acids (FISH OIL ) 1000 MG CAPS Take 2 capsules (2,000 mg total) by mouth in the morning and at bedtime. 180 capsule 12   ONETOUCH ULTRA test strip USE 1 STRIP TO CHECK GLUCOSE IN THE MORNING AND 1 AT BEDTIME 100 each 0   oxyCODONE -acetaminophen  (PERCOCET/ROXICET) 5-325 MG tablet Take 1 tablet by mouth 2 (two) times daily as needed for severe pain (pain score 7-10). 60 tablet 0   pantoprazole  (PROTONIX ) 40 MG tablet Take 1  tablet by mouth once daily 90 tablet 0   predniSONE  (DELTASONE ) 10 MG tablet Take 1 tablet by mouth once daily with breakfast 30 tablet 5   RELION INSULIN  SYRINGE 31G X 15/64 0.3 ML MISC AS DIRECTED 100 each 0   revefenacin  (YUPELRI ) 175 MCG/3ML nebulizer solution Take 3 mLs (175 mcg total) by nebulization daily. 90 mL 11   rosuvastatin  (CRESTOR ) 20 MG tablet Take 1 tablet (20 mg total) by mouth daily. 90 tablet 3   sodium chloride  HYPERTONIC 3 % nebulizer solution 3 ml twice daily via nebulizer (Patient taking differently: Take 4 mLs by nebulization 2 (two) times daily.) 180 mL 11   tamsulosin  (FLOMAX ) 0.4 MG CAPS capsule TAKE 2 CAPSULES BY MOUTH ONCE DAILY AFTER SUPPER 180 capsule 0   torsemide  (DEMADEX ) 20 MG tablet Take 1 tablet (20 mg total) by mouth as needed (edema). (Patient taking differently: Take 20 mg by mouth daily.) 30 tablet 0   TOUJEO  SOLOSTAR 300 UNIT/ML Solostar Pen Inject 40 Units into the skin daily. Inject 40 units daily, increase by 2 units every 3 days until sugars are constantly 140 or lower. Max dose of 60 units daily. (Patient taking differently: Inject 50 Units into the skin daily. Inject 50 units daily, increase by 2 units every 3 days until sugars are constantly 140 or lower. Max dose of 60 units daily.) 18 mL 0   traZODone  (DESYREL ) 150 MG tablet Take 1 tablet by mouth once daily 270 tablet 0   VENTOLIN  HFA 108 (90 Base) MCG/ACT inhaler INHALE 2 PUFFS BY MOUTH EVERY 6 HOURS AS NEEDED FOR SHORTNESS OF BREATH FOR WHEEZING (Patient taking differently: Inhale 2 puffs into the lungs every 6 (six) hours as needed for shortness of breath or wheezing.) 18 g 0   Vitamin D , Ergocalciferol , (DRISDOL ) 1.25 MG (50000 UNIT) CAPS capsule TAKE 1 CAPSULE BY MOUTH TWICE A WEEK 12 capsule 3   Current Facility-Administered Medications on File Prior to Visit  Medication Dose Route Frequency Provider Last Rate Last Admin   ipratropium-albuterol  (DUONEB) 0.5-2.5 (3) MG/3ML nebulizer solution 3  mL  3 mL Nebulization Once        Past Medical History:  Diagnosis Date   Abnormal chest CT 02/01/2024   Absence of bladder continence 01/08/2022   Acquired hypothyroidism 03/02/2024   Acute infection of nasal sinus 01/13/2023   Acute non-recurrent maxillary sinusitis 01/13/2023   Allergy 1989   Anxiety state 02/02/2022   Arthritis    Asymptomatic LV dysfunction 10/18/2017   Atherosclerosis of aorta (HCC) 01/13/2023   B12 deficiency anemia 08/25/2021   Basal cell carcinoma    Benign prostatic hyperplasia with incomplete bladder emptying 01/14/2019   Cancer (HCC)    throat - 1997, throat - 2018   Cardiomyopathy, secondary (HCC) 12/01/2016   Overview:  EF 47% 12/26/16   CHF (congestive heart failure) (HCC) 10/2002   Chronic coronary artery disease 10/18/2017   Chronic cough 12/16/2023   Chronic ischemic heart disease 11/13/1998   Jan 22, 2003 Entered By: CHRISTOPHER OLIVE J Comment:  massive Mi in 2000 per patient hsitory, 2nd Mi in 2001Mar 11, 2004 Entered By: CHRISTOPHER OLIVE J Comment: Had stent placedin 2000,cath/angio in 2001   Chronic kidney disease    Chronic neck pain 05/16/2022   Chronic pain syndrome 06/04/2022   Chronic respiratory failure with hypoxia (HCC) 05/16/2022   COPD (chronic obstructive pulmonary disease) (HCC)    COPD exacerbation (HCC) 07/30/2023   Coronary artery disease involving native coronary artery of native heart with angina pectoris (HCC) 12/01/2016   Overview:  He has hx of IWMI in remote past, last cath in 2012 at [facility] showed chronic total occlusion of previously stented RCA, with good collaterals, a 40% LAD stenosis, inferior hypokinesis and EF 45%.    He's been lost to Cardiology f/u since 2015, but has not had any recurrent events   Deficiency anemia 08/25/2021   Diabetic polyneuropathy associated with type 2 diabetes mellitus (HCC) 05/16/2022   Dyslipidemia 12/01/2016   Emphysema of lung (HCC)    Encounter for Medicare annual wellness exam 01/13/2023    Erectile dysfunction due to diseases classified elsewhere 06/12/2019   GAD (generalized anxiety disorder) 02/02/2022   GERD (gastroesophageal reflux disease)    Hallucination 10/08/2022   Hyperlipidemia    Hypertension    Hypertensive heart disease with heart failure (HCC)    Insomnia 01/28/2021   Iron  deficiency 07/07/2023   Iron  deficiency anemia due to chronic blood loss 08/25/2021   Lesion of vocal cord    Leukoplakia of larynx 11/15/2016   Long term (current) use of systemic steroids 12/16/2023   Long-term use of aspirin  therapy 10/02/2018   Lumbar back pain 06/04/2022   Lung nodule 11/30/2023   Lung nodules 08/17/2022   Macular degeneration of both eyes 03/02/2024   Malfunction of penile prosthesis (HCC) 01/14/2019   Malignant neoplasm of skin 01/28/2021   Formatting of this note might be different from the original. Jan 22, 2003 Entered By: CHRISTOPHER OLIVE J Comment: of skin - removed x2 inpast Jan 22, 2003 Entered By: CHRISTOPHER OLIVE J Comment: of skin - removed x2 inpast   Melanoma of back (HCC)    melanoma on back   Memory loss 03/08/2020   Mixed simple and mucopurulent chronic bronchitis (HCC) 03/02/2024   Moderate recurrent major depression (HCC) 03/02/2023   Mucopurulent chronic bronchitis (HCC) 04/08/2023   Myocardial infarction (HCC) 2000   Oropharyngeal dysphagia 08/12/2018   Other ill-defined and unknown causes of morbidity and mortality 11/13/1993   Formatting of this note might be different from the original. Jan 31, 2008 Entered By: MEMORY GULL  B Comment: lumbar, 8/08 Jan 22, 2003 Entered By: CHRISTOPHER OLIVE J Comment: x33yrs in work place  quit 51yrs ago in 2000 Jan 22, 2003 Entered By: CHRISTOPHER OLIVE J Comment: x85yrs in work place  quit 7yrs ago in 2000 Jan 31, 2008 Entered By: MEMORY GULL B Comment: lumbar, 8/08   Oxygen  deficiency    PAD (peripheral artery disease) (HCC) 10/18/2017   Peripheral vascular disease (HCC)    iliac artery clot   Peyronie's disease  06/12/2019   PVC (premature ventricular contraction) 10/09/2023   RBBB 10/09/2023   Sleep apnea    Squamous cell carcinoma of larynx (HCC) 12/13/2016   Steroid myopathy 02/01/2024   Stroke (HCC)    Tia they found on MRI   Throat cancer (HCC)    Tobacco use disorder 06/12/2019   Type 2 diabetes mellitus with hyperglycemia, with long-term current use of insulin  (HCC) 07/30/2023   URI, acute 05/31/2023   Urinary urgency 04/08/2023   Ventricular ectopy 11/09/2022   Vertigo 04/08/2023   Vitamin D  deficiency disease 01/13/2023   Past Surgical History:  Procedure Laterality Date   ABDOMINAL ANGIOGRAM N/A 01/13/2015   Procedure: ABDOMINAL ANGIOGRAM;  Surgeon: Krystal JULIANNA Doing, MD;  Location: [facility] CATH LAB;  Service: Cardiovascular;  Laterality: N/A;   ABDOMINAL AORTOGRAM W/LOWER EXTREMITY N/A 05/27/2024   Procedure: ABDOMINAL AORTOGRAM W/LOWER EXTREMITY;  Surgeon: Serene Gaile ORN, MD;  Location: [facility] INVASIVE CV LAB;  Service: Cardiovascular;  Laterality: N/A;   APPENDECTOMY     BACK SURGERY     CARDIAC CATHETERIZATION  2000/2012   with stents in 2000   CHOLECYSTECTOMY     COLONOSCOPY     ELBOW SURGERY     bilateral   ESOPHAGOGASTRODUODENOSCOPY     KNEE SURGERY Left    LOWER EXTREMITY ANGIOGRAPHY N/A 05/27/2024   Procedure: Lower Extremity Angiography;  Surgeon: Serene Gaile ORN, MD;  Location: [facility] INVASIVE CV LAB;  Service: Cardiovascular;  Laterality: N/A;   LOWER EXTREMITY INTERVENTION N/A 05/27/2024   Procedure: LOWER EXTREMITY INTERVENTION;  Surgeon: Serene Gaile ORN, MD;  Location: [facility] INVASIVE CV LAB;  Service: Cardiovascular;  Laterality: N/A;   MICROLARYNGOSCOPY WITH CO2 LASER AND EXCISION OF VOCAL CORD LESION N/A 11/23/2016   Procedure: MICROLARYNGOSCOPY  AND EXCISION OF VOCAL CORD LESION;  Surgeon: Norleen Notice, MD;  Location: [facility] OR;  Service: ENT;  Laterality: N/A;   MICROLARYNGOSCOPY WITH CO2 LASER AND EXCISION OF VOCAL CORD LESION N/A 01/11/2017   Procedure: MICROLARYNGOSCOPY WITH CO2  LASER AND EXCISION OF VOCAL CORD LESION;  Surgeon: Norleen Notice, MD;  Location: [facility] OR;  Service: ENT;  Laterality: N/A;   MICROLARYNGOSCOPY WITH LASER N/A 03/16/2015   Procedure: MICROLARYNGOSCOPY ;  Surgeon: Norleen Notice, MD;  Location: [facility] OR;  Service: ENT;  Laterality: N/A;   peyronie's surgery     SHOULDER SURGERY Right    rotator cuff   SPINE SURGERY  09/2020   cervical surgery. steel plate, bone spurs, allograft.   THROAT SURGERY  1997   cancer removed    Family History  Problem Relation Age of Onset   Cancer Mother        bone   Hypertension Mother    Stroke Father    Hypertension Father    Healthy Child    Cancer Other        brain   Diabetes Other    Social History   Socioeconomic History   Marital status: Married    Spouse name: Not on file   Number of children: 4  Years of education: Not on file   Highest education level: GED or equivalent  Occupational History   Occupation: retired  Tobacco Use   Smoking status: Every Day    Current packs/day: 1.00    Average packs/day: 1 pack/day for 50.0 years (50.0 ttl pk-yrs)    Types: Cigarettes   Smokeless tobacco: Never   Tobacco comments:    down to .5 ppd  11/02/2022 hfb  Vaping Use   Vaping status: Former  Substance and Sexual Activity   Alcohol use: Not on file   Drug use: No   Sexual activity: Not on file  Other Topics Concern   Not on file  Social History Narrative   Lives with wife       One level      Right hand   Social Drivers of Health   Financial Resource Strain: Low Risk  (04/29/2024)   Overall Financial Resource Strain (CARDIA)    Difficulty of Paying Living Expenses: Not hard at all  Food Insecurity: No Food Insecurity (04/29/2024)   Hunger Vital Sign    Worried About Running Out of Food in the Last Year: Never true    Ran Out of Food in the Last Year: Never true  Transportation Needs: No Transportation Needs (04/29/2024)   PRAPARE - Administrator, Civil Service (Medical): No     Lack of Transportation (Non-Medical): No  Physical Activity: Inactive (04/29/2024)   Exercise Vital Sign    Days of Exercise per Week: 0 days    Minutes of Exercise per Session: Not on file  Stress: Stress Concern Present (04/29/2024)   [employer] of Occupational Health - Occupational Stress Questionnaire    Feeling of Stress: To some extent  Social Connections: Socially Isolated (04/29/2024)   Social Connection and Isolation Panel    Frequency of Communication with Friends and Family: Once a week    Frequency of Social Gatherings with Friends and Family: Never    Attends Religious Services: Never    Diplomatic Services operational officer: No    Attends Engineer, structural: Not on file    Marital Status: Married    Objective:  BP 130/64   Pulse 64   Temp 97.8 F (36.6 C) (Temporal)   Resp 18   Ht 5' 10 (1.778 m)   Wt 196 lb 6.4 oz (89.1 kg)   SpO2 98%   BMI 28.18 kg/m      06/03/2024    1:16 PM 05/27/2024    4:14 PM 05/27/2024    3:59 PM  BP/Weight  Systolic BP 130 137 152  Diastolic BP 64 93 92  Wt. (Lbs) 196.4    BMI 28.18 kg/m2      Physical Exam Vitals reviewed.  Constitutional:      Appearance: Normal appearance.  HENT:     Right Ear: Tympanic membrane normal. Laceration and drainage present. No middle ear effusion. Tympanic membrane is not injected, perforated, erythematous or bulging.     Left Ear: Tympanic membrane normal. No laceration or drainage.  No middle ear effusion. Tympanic membrane is not injected, perforated, erythematous or bulging.     Ears:     Comments: Blood in ear canal with a scab. Patient admits to using Q-tip to itch and heard pop. No trauma to ear drum.  Neck:     Vascular: No carotid bruit.  Cardiovascular:     Rate and Rhythm: Normal rate and regular rhythm.     Heart  sounds: Normal heart sounds.  Pulmonary:     Effort: Pulmonary effort is normal.     Breath sounds: Normal breath sounds.  Abdominal:      General: Bowel sounds are normal.     Palpations: Abdomen is soft.     Tenderness: There is abdominal tenderness in the periumbilical area and suprapubic area. There is no guarding or rebound.  Neurological:     Mental Status: He is alert and oriented to person, place, and time.  Psychiatric:        Mood and Affect: Mood normal.        Behavior: Behavior normal.         Lab Results  Component Value Date   WBC 16.1 (H) 02/28/2024   HGB 15.3 05/27/2024   HCT 45.0 05/27/2024   PLT 195 02/28/2024   GLUCOSE 312 (H) 05/27/2024   CHOL 120 02/28/2024   TRIG 517 (H) 02/28/2024   HDL 27 (L) 02/28/2024   LDLCALC 23 02/28/2024   ALT 21 02/28/2024   AST 22 02/28/2024   NA 140 05/27/2024   K 4.0 05/27/2024   CL 99 05/27/2024   CREATININE 0.90 05/27/2024   BUN 14 05/27/2024   CO2 26 02/28/2024   TSH 2.280 02/28/2024   INR 1.00 11/14/2023   HGBA1C 11.8 (H) 02/28/2024   MICROALBUR 30 05/23/2021      Assessment & Plan:  Memory loss Assessment & Plan: Recent confusion and hallucinations, possibly due to mini-strokes. Blood pressure fluctuations noted. Oxygen  levels stable. - Order CT scan of the brain without contrast. - Perform stat blood work. - Monitor for new or worsening symptoms.  Orders: -     CT HEAD WO CONTRAST ( ); Future  History of recent fall Assessment & Plan: Recent confusion and hallucinations, possibly due to mini-strokes. Blood pressure fluctuations noted. Oxygen  levels stable. - Order CT scan of the brain without contrast. - Perform stat blood work. - Monitor for new or worsening symptoms.  Orders: -     CT HEAD WO CONTRAST ( ); Future -     B12 and Folate Panel -     CBC with Differential/Platelet -     Comprehensive metabolic panel with GFR -     TSH  Lower abdominal pain Assessment & Plan: Worsening pain post-procedure, likely related to aspirin  use. Differential includes NSAID-induced ulcer, constipation, or post-surgical complications.  Bruising not concerning for significant bleeding. Ischemic bowel less likely. - Discontinue aspirin . - Consider Maalox for ulcer protection. - Monitor bowel movements for blood. - Consider ultrasound if pain persists. - Monitor groin bruising for changes.   Blood in ear canal, right Assessment & Plan: Recent confusion and hallucinations, possibly due to mini-strokes. Blood pressure fluctuations noted. Oxygen  levels stable. - Order CT scan of the brain without contrast. - Perform stat blood work. - Monitor for new or worsening symptoms.     Assessment and Plan  No orders of the defined types were placed in this encounter.   Orders Placed This Encounter  Procedures   CT HEAD WO CONTRAST ( )   B12 and Folate Panel   CBC with Differential/Platelet   Comprehensive metabolic panel with GFR   TSH     Follow-up: No follow-ups on file.   I,Angela Taylor,acting as a Neurosurgeon for [employer], PA.,have documented all relevant documentation on the behalf of Nola Angles, PA,as directed by  Nola Angles, PA while in the presence of Nola Angles, [place].   An After Visit Summary was printed  and given to the patient.  Nola Angles, [place] [facility] [phone]

## 2024-06-03 NOTE — Assessment & Plan Note (Signed)
 Worsening pain post-procedure, likely related to aspirin  use. Differential includes NSAID-induced ulcer, constipation, or post-surgical complications. Bruising not concerning for significant bleeding. Ischemic bowel less likely. - Discontinue aspirin . - Consider Maalox for ulcer protection. - Monitor bowel movements for blood. - Consider ultrasound if pain persists. - Monitor groin bruising for changes.

## 2024-06-04 ENCOUNTER — Other Ambulatory Visit (HOSPITAL_BASED_OUTPATIENT_CLINIC_OR_DEPARTMENT_OTHER): Admitting: Radiology

## 2024-06-04 ENCOUNTER — Ambulatory Visit (INDEPENDENT_AMBULATORY_CARE_PROVIDER_SITE_OTHER)
Admission: RE | Admit: 2024-06-04 | Discharge: 2024-06-04 | Disposition: A | Discharge status: Home / Self Care | Source: Ambulatory Visit | Attending: Physician Assistant | Admitting: Physician Assistant

## 2024-06-04 DIAGNOSIS — R413 Other amnesia: Secondary | ICD-10-CM | POA: Diagnosis not present

## 2024-06-04 DIAGNOSIS — Z9181 History of falling: Secondary | ICD-10-CM

## 2024-06-04 DIAGNOSIS — I6523 Occlusion and stenosis of bilateral carotid arteries: Secondary | ICD-10-CM | POA: Diagnosis not present

## 2024-06-04 DIAGNOSIS — S0990XA Unspecified injury of head, initial encounter: Secondary | ICD-10-CM | POA: Diagnosis not present

## 2024-06-04 LAB — COMPREHENSIVE METABOLIC PANEL WITH GFR
ALT: 21 IU/L (ref 0–44)
AST: 21 IU/L (ref 0–40)
Albumin: 3.7 g/dL — ABNORMAL LOW (ref 3.8–4.8)
Alkaline Phosphatase: 82 IU/L (ref 44–121)
BUN/Creatinine Ratio: 16 (ref 10–24)
BUN: 15 mg/dL (ref 8–27)
Bilirubin Total: 0.5 mg/dL (ref 0.0–1.2)
CO2: 24 mmol/L (ref 20–29)
Calcium: 9.1 mg/dL (ref 8.6–10.2)
Chloride: 102 mmol/L (ref 96–106)
Creatinine, Ser: 0.96 mg/dL (ref 0.76–1.27)
Globulin, Total: 2.2 g/dL (ref 1.5–4.5)
Glucose: 321 mg/dL — ABNORMAL HIGH (ref 70–99)
Potassium: 4.9 mmol/L (ref 3.5–5.2)
Sodium: 141 mmol/L (ref 134–144)
Total Protein: 5.9 g/dL — ABNORMAL LOW (ref 6.0–8.5)
eGFR: 83 mL/min/1.73 (ref >59)

## 2024-06-04 LAB — CBC WITH DIFFERENTIAL/PLATELET
Basophils Absolute: 0.1 x10E3/uL (ref 0.0–0.2)
Basos: 1 %
EOS (ABSOLUTE): 0.1 x10E3/uL (ref 0.0–0.4)
Eos: 1 %
Hematocrit: 48.2 % (ref 37.5–51.0)
Hemoglobin: 15.5 g/dL (ref 13.0–17.7)
Immature Grans (Abs): 0.2 x10E3/uL — ABNORMAL HIGH (ref 0.0–0.1)
Immature Granulocytes: 1 %
Lymphocytes Absolute: 2 x10E3/uL (ref 0.7–3.1)
Lymphs: 13 %
MCH: 31.3 pg (ref 26.6–33.0)
MCHC: 32.2 g/dL (ref 31.5–35.7)
MCV: 97 fL (ref 79–97)
Monocytes Absolute: 0.8 x10E3/uL (ref 0.1–0.9)
Monocytes: 5 %
Neutrophils Absolute: 12.2 x10E3/uL — ABNORMAL HIGH (ref 1.4–7.0)
Neutrophils: 79 %
Platelets: 207 x10E3/uL (ref 150–450)
RBC: 4.95 x10E6/uL (ref 4.14–5.80)
RDW: 13.5 % (ref 11.6–15.4)
WBC: 15.4 x10E3/uL — ABNORMAL HIGH (ref 3.4–10.8)

## 2024-06-04 LAB — TSH: TSH: 1.19 u[IU]/mL (ref 0.450–4.500)

## 2024-06-04 LAB — B12 AND FOLATE PANEL
Folate: 14.9 ng/mL (ref >3.0)
Vitamin B-12: 421 pg/mL (ref 232–1245)

## 2024-06-05 ENCOUNTER — Encounter: Payer: Self-pay | Admitting: Physician Assistant

## 2024-06-05 ENCOUNTER — Ambulatory Visit: Payer: Self-pay | Admitting: Physician Assistant

## 2024-06-09 ENCOUNTER — Other Ambulatory Visit: Payer: Self-pay | Admitting: Physician Assistant

## 2024-06-09 DIAGNOSIS — R413 Other amnesia: Secondary | ICD-10-CM

## 2024-06-11 DIAGNOSIS — C329 Malignant neoplasm of larynx, unspecified: Secondary | ICD-10-CM | POA: Diagnosis not present

## 2024-06-13 ENCOUNTER — Ambulatory Visit (HOSPITAL_COMMUNITY)
Admission: RE | Admit: 2024-06-13 | Discharge: 2024-06-13 | Disposition: A | Discharge status: Home / Self Care | Source: Ambulatory Visit | Attending: Physician Assistant | Admitting: Physician Assistant

## 2024-06-13 DIAGNOSIS — R413 Other amnesia: Secondary | ICD-10-CM | POA: Insufficient documentation

## 2024-06-15 ENCOUNTER — Ambulatory Visit (HOSPITAL_BASED_OUTPATIENT_CLINIC_OR_DEPARTMENT_OTHER): Admission: RE | Admit: 2024-06-15 | Source: Ambulatory Visit

## 2024-06-16 DIAGNOSIS — J9610 Chronic respiratory failure, unspecified whether with hypoxia or hypercapnia: Secondary | ICD-10-CM | POA: Diagnosis not present

## 2024-06-16 DIAGNOSIS — J449 Chronic obstructive pulmonary disease, unspecified: Secondary | ICD-10-CM | POA: Diagnosis not present

## 2024-06-17 ENCOUNTER — Encounter: Payer: Self-pay | Admitting: Surgery

## 2024-06-17 ENCOUNTER — Encounter: Payer: Self-pay | Admitting: Physician Assistant

## 2024-06-17 ENCOUNTER — Other Ambulatory Visit: Payer: Self-pay | Admitting: Surgery

## 2024-06-17 DIAGNOSIS — I70213 Atherosclerosis of native arteries of extremities with intermittent claudication, bilateral legs: Secondary | ICD-10-CM

## 2024-06-17 DIAGNOSIS — I739 Peripheral vascular disease, unspecified: Secondary | ICD-10-CM

## 2024-06-18 ENCOUNTER — Ambulatory Visit: Payer: Self-pay | Admitting: Physician Assistant

## 2024-06-18 ENCOUNTER — Encounter: Payer: Self-pay | Admitting: Family Medicine

## 2024-06-22 ENCOUNTER — Ambulatory Visit (HOSPITAL_BASED_OUTPATIENT_CLINIC_OR_DEPARTMENT_OTHER)
Admission: RE | Admit: 2024-06-22 | Discharge: 2024-06-22 | Disposition: A | Discharge status: Home / Self Care | Source: Ambulatory Visit | Attending: Physician Assistant | Admitting: Physician Assistant

## 2024-06-22 DIAGNOSIS — R413 Other amnesia: Secondary | ICD-10-CM | POA: Diagnosis not present

## 2024-06-22 MED ORDER — GADOBUTROL 1 MMOL/ML IV SOLN
9.0000 mL | Freq: Once | INTRAVENOUS | Status: AC | PRN
  Administered 2024-06-22: 9 mL via INTRAVENOUS

## 2024-06-24 ENCOUNTER — Other Ambulatory Visit: Payer: Self-pay | Admitting: Family Medicine

## 2024-06-27 ENCOUNTER — Encounter: Payer: Self-pay | Admitting: Cardiology

## 2024-06-30 ENCOUNTER — Other Ambulatory Visit: Payer: Self-pay | Admitting: Family Medicine

## 2024-06-30 ENCOUNTER — Ambulatory Visit: Admitting: Family Medicine

## 2024-06-30 NOTE — Progress Notes (Unsigned)
 Acute Office Visit  Subjective:    Patient ID: Don [name], male    DOB: Mar 14, 1951, 73 y.o.   MRN: [id number]  No chief complaint on file.   HPI: Patient is in today for ***  Past Medical History:  Diagnosis Date   Abnormal chest CT 02/01/2024   Absence of bladder continence 01/08/2022   Acquired hypothyroidism 03/02/2024   Acute infection of nasal sinus 01/13/2023   Acute non-recurrent maxillary sinusitis 01/13/2023   Allergy 1989   Anxiety state 02/02/2022   Arthritis    Asymptomatic LV dysfunction 10/18/2017   Atherosclerosis of aorta (HCC) 01/13/2023   B12 deficiency anemia 08/25/2021   Basal cell carcinoma    Benign prostatic hyperplasia with incomplete bladder emptying 01/14/2019   Cancer (HCC)    throat - 1997, throat - 2018   Cardiomyopathy, secondary (HCC) 12/01/2016   Overview:  EF 47% 12/26/16   CHF (congestive heart failure) (HCC) 10/2002   Chronic coronary artery disease 10/18/2017   Chronic cough 12/16/2023   Chronic ischemic heart disease 11/13/1998   Jan 22, 2003 Entered By: CHRISTOPHER OLIVE J Comment:  massive Mi in 2000 per patient hsitory, 2nd Mi in 2001Mar 11, 2004 Entered By: CHRISTOPHER OLIVE J Comment: Had stent placedin 2000,cath/angio in 2001   Chronic kidney disease    Chronic neck pain 05/16/2022   Chronic pain syndrome 06/04/2022   Chronic respiratory failure with hypoxia (HCC) 05/16/2022   COPD (chronic obstructive pulmonary disease) (HCC)    COPD exacerbation (HCC) 07/30/2023   Coronary artery disease involving native coronary artery of native heart with angina pectoris (HCC) 12/01/2016   Overview:  He has hx of IWMI in remote past, last cath in 2012 at [facility] showed chronic total occlusion of previously stented RCA, with good collaterals, a 40% LAD stenosis, inferior hypokinesis and EF 45%.    He's been lost to Cardiology f/u since 2015, but has not had any recurrent events   Deficiency anemia 08/25/2021   Diabetic polyneuropathy associated  with type 2 diabetes mellitus (HCC) 05/16/2022   Dyslipidemia 12/01/2016   Emphysema of lung (HCC)    Encounter for Medicare annual wellness exam 01/13/2023   Erectile dysfunction due to diseases classified elsewhere 06/12/2019   GAD (generalized anxiety disorder) 02/02/2022   GERD (gastroesophageal reflux disease)    Hallucination 10/08/2022   Hyperlipidemia    Hypertension    Hypertensive heart disease with heart failure (HCC)    Insomnia 01/28/2021   Iron  deficiency 07/07/2023   Iron  deficiency anemia due to chronic blood loss 08/25/2021   Lesion of vocal cord    Leukoplakia of larynx 11/15/2016   Long term (current) use of systemic steroids 12/16/2023   Long-term use of aspirin  therapy 10/02/2018   Lumbar back pain 06/04/2022   Lung nodule 11/30/2023   Lung nodules 08/17/2022   Macular degeneration of both eyes 03/02/2024   Malfunction of penile prosthesis (HCC) 01/14/2019   Malignant neoplasm of skin 01/28/2021   Formatting of this note might be different from the original. Jan 22, 2003 Entered By: CHRISTOPHER OLIVE J Comment: of skin - removed x2 inpast Jan 22, 2003 Entered By: CHRISTOPHER OLIVE J Comment: of skin - removed x2 inpast   Melanoma of back (HCC)    melanoma on back   Memory loss 03/08/2020   Mixed simple and mucopurulent chronic bronchitis (HCC) 03/02/2024   Moderate recurrent major depression (HCC) 03/02/2023   Mucopurulent chronic bronchitis (HCC) 04/08/2023   Myocardial infarction (HCC) 2000   Oropharyngeal dysphagia 08/12/2018  Other ill-defined and unknown causes of morbidity and mortality 11/13/1993   Formatting of this note might be different from the original. Jan 31, 2008 Entered By: ARMOUR,ROSS B Comment: lumbar, 8/08 Jan 22, 2003 Entered By: CHRISTOPHER OLIVE J Comment: x47yrs in work place  quit 52yrs ago in 2000 Jan 22, 2003 Entered By: CHRISTOPHER OLIVE J Comment: x71yrs in work place  quit 73yrs ago in 2000 Jan 31, 2008 Entered By: MEMORY GULL B Comment: lumbar,  8/08   Oxygen  deficiency    PAD (peripheral artery disease) (HCC) 10/18/2017   Peripheral vascular disease (HCC)    iliac artery clot   Peyronie's disease 06/12/2019   PVC (premature ventricular contraction) 10/09/2023   RBBB 10/09/2023   Sleep apnea    Squamous cell carcinoma of larynx (HCC) 12/13/2016   Steroid myopathy 02/01/2024   Stroke (HCC)    Tia they found on MRI   Throat cancer (HCC)    Tobacco use disorder 06/12/2019   Type 2 diabetes mellitus with hyperglycemia, with long-term current use of insulin  (HCC) 07/30/2023   URI, acute 05/31/2023   Urinary urgency 04/08/2023   Ventricular ectopy 11/09/2022   Vertigo 04/08/2023   Vitamin D  deficiency disease 01/13/2023    Past Surgical History:  Procedure Laterality Date   ABDOMINAL ANGIOGRAM N/A 01/13/2015   Procedure: ABDOMINAL ANGIOGRAM;  Surgeon: Krystal JULIANNA Doing, MD;  Location: [facility] CATH LAB;  Service: Cardiovascular;  Laterality: N/A;   ABDOMINAL AORTOGRAM W/LOWER EXTREMITY N/A 05/27/2024   Procedure: ABDOMINAL AORTOGRAM W/LOWER EXTREMITY;  Surgeon: Serene Gaile ORN, MD;  Location: [facility] INVASIVE CV LAB;  Service: Cardiovascular;  Laterality: N/A;   APPENDECTOMY     BACK SURGERY     CARDIAC CATHETERIZATION  2000/2012   with stents in 2000   CHOLECYSTECTOMY     COLONOSCOPY     ELBOW SURGERY     bilateral   ESOPHAGOGASTRODUODENOSCOPY     KNEE SURGERY Left    LOWER EXTREMITY ANGIOGRAPHY N/A 05/27/2024   Procedure: Lower Extremity Angiography;  Surgeon: Serene Gaile ORN, MD;  Location: [facility] INVASIVE CV LAB;  Service: Cardiovascular;  Laterality: N/A;   LOWER EXTREMITY INTERVENTION N/A 05/27/2024   Procedure: LOWER EXTREMITY INTERVENTION;  Surgeon: Serene Gaile ORN, MD;  Location: [facility] INVASIVE CV LAB;  Service: Cardiovascular;  Laterality: N/A;   MICROLARYNGOSCOPY WITH CO2 LASER AND EXCISION OF VOCAL CORD LESION N/A 11/23/2016   Procedure: MICROLARYNGOSCOPY  AND EXCISION OF VOCAL CORD LESION;  Surgeon: Norleen Notice, MD;  Location: [facility]  OR;  Service: ENT;  Laterality: N/A;   MICROLARYNGOSCOPY WITH CO2 LASER AND EXCISION OF VOCAL CORD LESION N/A 01/11/2017   Procedure: MICROLARYNGOSCOPY WITH CO2 LASER AND EXCISION OF VOCAL CORD LESION;  Surgeon: Norleen Notice, MD;  Location: [facility] OR;  Service: ENT;  Laterality: N/A;   MICROLARYNGOSCOPY WITH LASER N/A 03/16/2015   Procedure: MICROLARYNGOSCOPY ;  Surgeon: Norleen Notice, MD;  Location: [facility] OR;  Service: ENT;  Laterality: N/A;   peyronie's surgery     SHOULDER SURGERY Right    rotator cuff   SPINE SURGERY  09/2020   cervical surgery. steel plate, bone spurs, allograft.   THROAT SURGERY  1997   cancer removed    Family History  Problem Relation Age of Onset   Cancer Mother        bone   Hypertension Mother    Stroke Father    Hypertension Father    Healthy Child    Cancer Other        brain   Diabetes  Other     Social History   Socioeconomic History   Marital status: Married    Spouse name: Not on file   Number of children: 4   Years of education: Not on file   Highest education level: GED or equivalent  Occupational History   Occupation: retired  Tobacco Use   Smoking status: Every Day    Current packs/day: 1.00    Average packs/day: 1 pack/day for 50.0 years (50.0 ttl pk-yrs)    Types: Cigarettes   Smokeless tobacco: Never   Tobacco comments:    down to .5 ppd  11/02/2022 hfb  Vaping Use   Vaping status: Former  Substance and Sexual Activity   Alcohol use: Not on file   Drug use: No   Sexual activity: Not on file  Other Topics Concern   Not on file  Social History Narrative   Lives with wife       One level      Right hand   Social Drivers of Health   Financial Resource Strain: Low Risk  (04/29/2024)   Overall Financial Resource Strain (CARDIA)    Difficulty of Paying Living Expenses: Not hard at all  Food Insecurity: No Food Insecurity (04/29/2024)   Hunger Vital Sign    Worried About Running Out of Food in the Last Year: Never true    Ran Out  of Food in the Last Year: Never true  Transportation Needs: No Transportation Needs (04/29/2024)   PRAPARE - Administrator, Civil Service (Medical): No    Lack of Transportation (Non-Medical): No  Physical Activity: Inactive (04/29/2024)   Exercise Vital Sign    Days of Exercise per Week: 0 days    Minutes of Exercise per Session: Not on file  Stress: Stress Concern Present (04/29/2024)   [employer] of Occupational Health - Occupational Stress Questionnaire    Feeling of Stress: To some extent  Social Connections: Socially Isolated (04/29/2024)   Social Connection and Isolation Panel    Frequency of Communication with Friends and Family: Once a week    Frequency of Social Gatherings with Friends and Family: Never    Attends Religious Services: Never    Database administrator or Organizations: No    Attends Engineer, structural: Not on file    Marital Status: Married  Catering manager Violence: Not At Risk (01/11/2023)   Humiliation, Afraid, Rape, and Kick questionnaire    Fear of Current or Ex-Partner: No    Emotionally Abused: No    Physically Abused: No    Sexually Abused: No    Outpatient Medications Prior to Visit  Medication Sig Dispense Refill   ALPRAZolam  (XANAX ) 0.25 MG tablet Take 1 tablet by mouth twice daily as needed for anxiety 60 tablet 0   arformoterol  (BROVANA ) 15 MCG/2ML NEBU Take 2 mLs (15 mcg total) by nebulization 2 (two) times daily. 120 mL 6   aspirin  EC 81 MG tablet Take 1 tablet (81 mg total) by mouth daily. Swallow whole. 150 tablet 2   budesonide  (PULMICORT ) 0.25 MG/2ML nebulizer solution Take 2 mLs (0.25 mg total) by nebulization in the morning and at bedtime. 60 mL 11   busPIRone  (BUSPAR ) 7.5 MG tablet Take 1 tablet by mouth twice daily 60 tablet 3   clopidogrel  (PLAVIX ) 75 MG tablet Take 1 tablet by mouth once daily 90 tablet 0   cyanocobalamin  (VITAMIN B12) 1000 MCG/ML injection INJECT 1 ML (CC) INTRAMUSCULARLY ONCE A WEEK 10 mL  0   cyclobenzaprine  (FLEXERIL ) 10 MG tablet Take 1 tablet by mouth three times daily as needed for muscle spasm 90 tablet 0   DULoxetine  (CYMBALTA ) 60 MG capsule Take 1 capsule by mouth twice daily 60 capsule 0   Dupilumab  (DUPIXENT ) 300 MG/2ML SOAJ Inject 300 mg into the skin every 14 (fourteen) days. 12 mL 1   finasteride  (PROSCAR ) 5 MG tablet Take 1 tablet by mouth once daily 90 tablet 0   furosemide  (LASIX ) 20 MG tablet Take 1 tablet (20 mg total) by mouth daily. 90 tablet 1   gabapentin  (NEURONTIN ) 400 MG capsule TAKE 1 CAPSULE BY MOUTH THREE TIMES DAILY 270 capsule 1   insulin  lispro (HUMALOG ) 100 UNIT/ML injection Inject 0.04 mLs (4 Units total) into the skin 3 (three) times daily with meals. (Patient taking differently: Inject 5 Units into the skin 3 (three) times daily as needed for high blood sugar (higher than 400 give 5 units).) 10 mL 11   Insulin  Pen Needle (PEN NEEDLES) 31G X 8 MM MISC Inject insulin  as directed 100 each 1   ipratropium-albuterol  (DUONEB) 0.5-2.5 (3) MG/3ML SOLN USE 1 VIAL IN NEBULIZER 4 TIMES DAILY (Patient taking differently: Take 3 mLs by nebulization 2 (two) times daily.) 360 mL 0   Lancets (ONETOUCH DELICA PLUS LANCET33G) MISC USE 1  TO CHECK GLUCOSE THREE TIMES DAILY (Patient taking differently: 1 each by Other route 3 (three) times daily.) 100 each 0   levothyroxine  (SYNTHROID ) 25 MCG tablet Take 1 tablet by mouth once daily 90 tablet 0   losartan  (COZAAR ) 50 MG tablet Take 1 tablet (50 mg total) by mouth daily. 90 tablet 2   Magnesium  Chloride (MAG64) 64 MG TBEC Take 1 tablet (64 mg total) by mouth daily. 90 tablet 1   metFORMIN  (GLUCOPHAGE ) 1000 MG tablet Take 1,000 mg by mouth 2 (two) times daily.     metFORMIN  (GLUCOPHAGE ) 500 MG tablet Take 2 tablets (1,000 mg total) by mouth 2 (two) times daily with a meal.     metoprolol  succinate (TOPROL -XL) 100 MG 24 hr tablet Take 1 tablet (100 mg total) by mouth daily. Take with or immediately following a meal. 90  tablet 2   nitroGLYCERIN  (NITROSTAT ) 0.4 MG SL tablet Place 1 tablet (0.4 mg total) under the tongue every 5 (five) minutes as needed for chest pain. DISSOLVE ONE TABLET UNDER THE TONGUE EVERY 5 TO 10 MINUTES PRIOR TO ACTIVITIES WHICH MIGHT PRECIPITATE AN ATTACK 25 tablet 0   Omega-3 Fatty Acids (FISH OIL ) 1000 MG CAPS Take 2 capsules (2,000 mg total) by mouth in the morning and at bedtime. 180 capsule 12   ONETOUCH ULTRA test strip USE 1 STRIP TO CHECK GLUCOSE IN THE MORNING AND 1 AT BEDTIME 100 each 0   oxyCODONE -acetaminophen  (PERCOCET/ROXICET) 5-325 MG tablet Take 1 tablet by mouth 2 (two) times daily as needed for severe pain (pain score 7-10). 60 tablet 0   pantoprazole  (PROTONIX ) 40 MG tablet Take 1 tablet by mouth once daily 90 tablet 0   predniSONE  (DELTASONE ) 10 MG tablet Take 1 tablet by mouth once daily with breakfast 30 tablet 5   RELION INSULIN  SYRINGE 31G X 15/64 0.3 ML MISC AS DIRECTED 100 each 0   revefenacin  (YUPELRI ) 175 MCG/3ML nebulizer solution Take 3 mLs (175 mcg total) by nebulization daily. 90 mL 11   rosuvastatin  (CRESTOR ) 20 MG tablet Take 1 tablet (20 mg total) by mouth daily. 90 tablet 3   sodium chloride  HYPERTONIC 3 % nebulizer solution  3 ml twice daily via nebulizer (Patient taking differently: Take 4 mLs by nebulization 2 (two) times daily.) 180 mL 11   spironolactone  (ALDACTONE ) 25 MG tablet Take 1 tablet by mouth once daily 90 tablet 1   tamsulosin  (FLOMAX ) 0.4 MG CAPS capsule TAKE 2 CAPSULES BY MOUTH ONCE DAILY AFTER SUPPER 180 capsule 0   torsemide  (DEMADEX ) 20 MG tablet Take 1 tablet (20 mg total) by mouth as needed (edema). (Patient taking differently: Take 20 mg by mouth daily.) 30 tablet 0   TOUJEO  SOLOSTAR 300 UNIT/ML Solostar Pen Inject 40 Units into the skin daily. Inject 40 units daily, increase by 2 units every 3 days until sugars are constantly 140 or lower. Max dose of 60 units daily. (Patient taking differently: Inject 50 Units into the skin daily.  Inject 50 units daily, increase by 2 units every 3 days until sugars are constantly 140 or lower. Max dose of 60 units daily.) 18 mL 0   traZODone  (DESYREL ) 150 MG tablet Take 1 tablet by mouth once daily 270 tablet 0   VENTOLIN  HFA 108 (90 Base) MCG/ACT inhaler INHALE 2 PUFFS BY MOUTH EVERY 6 HOURS AS NEEDED FOR SHORTNESS OF BREATH FOR WHEEZING (Patient taking differently: Inhale 2 puffs into the lungs every 6 (six) hours as needed for shortness of breath or wheezing.) 18 g 0   Vitamin D , Ergocalciferol , (DRISDOL ) 1.25 MG (50000 UNIT) CAPS capsule TAKE 1 CAPSULE BY MOUTH TWICE A WEEK 12 capsule 3   Facility-Administered Medications Prior to Visit  Medication Dose Route Frequency Provider Last Rate Last Admin   ipratropium-albuterol  (DUONEB) 0.5-2.5 (3) MG/3ML nebulizer solution 3 mL  3 mL Nebulization Once         Allergies  Allergen Reactions   Penicillins Rash and Hives    Has patient had a PCN reaction causing immediate rash, facial/tongue/throat swelling, SOB or lightheadedness with hypotension:unsure Has patient had a PCN reaction causing severe rash involving mucus membranes or skin necrosis:unsure Has patient had a PCN reaction that required hospitalization:No Has patient had a PCN reaction occurring within the last 10 years:No If all of the above answers are NO, then may proceed with Cephalosporin use.  rash   Bactrim  [Sulfamethoxazole -Trimethoprim ]     Dizziness, confusion, involuntary movements   Iodinated Contrast Media Rash    Also developed blisters   Penicillin G Rash   Tramadol Rash    Review of Systems  Constitutional:  Negative for appetite change, fatigue and fever.  HENT:  Positive for congestion. Negative for ear pain, sinus pressure and sore throat.   Respiratory:  Positive for cough and shortness of breath. Negative for chest tightness and wheezing.   Cardiovascular:  Negative for chest pain and palpitations.  Gastrointestinal:  Negative for abdominal pain,  constipation, diarrhea, nausea and vomiting.  Genitourinary:  Negative for dysuria and hematuria.  Musculoskeletal:  Negative for arthralgias, back pain, joint swelling and myalgias.  Skin:  Negative for rash.  Neurological:  Negative for dizziness, weakness and headaches.  Psychiatric/Behavioral:  Negative for dysphoric mood. The patient is not nervous/anxious.        Objective:        06/03/2024    1:16 PM 05/27/2024    4:14 PM 05/27/2024    3:59 PM  Vitals with BMI  Height 5' 10    Weight 196 lbs 6 oz    BMI 28.18    Systolic 130 137 847  Diastolic 64 93 92  Pulse 64 89 90  No data found.   Physical Exam  Health Maintenance Due  Topic Date Due   OPHTHALMOLOGY EXAM  Never done   Hepatitis C Screening  Never done   Zoster Vaccines- Shingrix (1 of 2) Never done   FOOT EXAM  01/11/2024   Medicare Annual Wellness (AWV)  01/13/2024   COVID-19 Vaccine (4 - Mixed Product risk 2024-25 season) 04/08/2024   INFLUENZA VACCINE  06/13/2024    There are no preventive care reminders to display for this patient.   Lab Results  Component Value Date   TSH 1.190 06/03/2024   Lab Results  Component Value Date   WBC 15.4 (H) 06/03/2024   HGB 15.5 06/03/2024   HCT 48.2 06/03/2024   MCV 97 06/03/2024   PLT 207 06/03/2024   Lab Results  Component Value Date   NA 141 06/03/2024   K 4.9 06/03/2024   CO2 24 06/03/2024   GLUCOSE 321 (H) 06/03/2024   BUN 15 06/03/2024   CREATININE 0.96 06/03/2024   BILITOT 0.5 06/03/2024   ALKPHOS 82 06/03/2024   AST 21 06/03/2024   ALT 21 06/03/2024   PROT 5.9 (L) 06/03/2024   ALBUMIN 3.7 (L) 06/03/2024   CALCIUM  9.1 06/03/2024   ANIONGAP 7 11/10/2022   EGFR 83 06/03/2024   GFR 74.13 08/17/2022   Lab Results  Component Value Date   CHOL 120 02/28/2024   Lab Results  Component Value Date   HDL 27 (L) 02/28/2024   Lab Results  Component Value Date   LDLCALC 23 02/28/2024   Lab Results  Component Value Date   TRIG 517 (H)  02/28/2024   Lab Results  Component Value Date   CHOLHDL 4.4 02/28/2024   Lab Results  Component Value Date   HGBA1C 11.8 (H) 02/28/2024       Assessment & Plan:  There are no diagnoses linked to this encounter.   No orders of the defined types were placed in this encounter.   No orders of the defined types were placed in this encounter.    Follow-up: No follow-ups on file.  An After Visit Summary was printed and given to the patient.    I,Maguire Sime M Fallon Haecker,acting as a Neurosurgeon for [employer], [employer].,have documented all relevant documentation on the behalf of Don Angles, PA,as directed by  Don Angles, PA while in the presence of Don [name], [place].    Don [name], [place] [facility] [phone]

## 2024-07-01 ENCOUNTER — Telehealth (INDEPENDENT_AMBULATORY_CARE_PROVIDER_SITE_OTHER): Admitting: Physician Assistant

## 2024-07-01 ENCOUNTER — Encounter: Payer: Self-pay | Admitting: Physician Assistant

## 2024-07-01 VITALS — BP 128/70 | HR 57 | Temp 98.8°F | Ht 70.0 in | Wt 198.0 lb

## 2024-07-01 DIAGNOSIS — J441 Chronic obstructive pulmonary disease with (acute) exacerbation: Secondary | ICD-10-CM | POA: Diagnosis not present

## 2024-07-01 DIAGNOSIS — R2689 Other abnormalities of gait and mobility: Secondary | ICD-10-CM

## 2024-07-01 DIAGNOSIS — F331 Major depressive disorder, recurrent, moderate: Secondary | ICD-10-CM

## 2024-07-01 DIAGNOSIS — Z8673 Personal history of transient ischemic attack (TIA), and cerebral infarction without residual deficits: Secondary | ICD-10-CM | POA: Diagnosis not present

## 2024-07-01 HISTORY — DX: Other abnormalities of gait and mobility: R26.89

## 2024-07-01 MED ORDER — PREDNISONE 20 MG PO TABS: ORAL_TABLET | ORAL | 0 refills | Status: AC

## 2024-07-01 MED ORDER — DOXYCYCLINE HYCLATE 100 MG PO TABS: 100.0000 mg | ORAL_TABLET | Freq: Two times a day (BID) | ORAL | 0 refills | Status: DC

## 2024-07-01 NOTE — Assessment & Plan Note (Addendum)
 Sent Doxycycline  100 mg BID x 7 days and Prednisone  20 mg pre-pack take as directed #18. Referring to pharmacy service.

## 2024-07-01 NOTE — Assessment & Plan Note (Signed)
 Referring to Home Health.

## 2024-07-01 NOTE — Progress Notes (Unsigned)
 Virtual Visit via Video Note   This visit type was conducted per patient request This format is felt to be appropriate for this patient at this time.  All issues noted in this document were discussed and addressed.  A limited physical exam was performed with this format.  A verbal consent was obtained for the virtual visit.   Date:  07/02/2024   ID:  Don [name], DOB Sep 08, 1951, MRN [id number]  Patient Location: Home Provider Location: Office/Clinic  PCP:  Don Clapper, MD   No chief complaint on file.    History of Present Illness:    The patient does not have symptoms concerning for COVID-19 infection (fever, chills, cough, or new shortness of breath).   Discussed the use of AI scribe software for clinical note transcription with the patient, who gave verbal consent to proceed.  History of Present Illness   Don [name] is a 73 year old male with COPD who presents with worsening cough. He is accompanied by his caregiver.  He has been experiencing a worsening cough for the past two to three days, producing white sputum. He spends a lot of time in the garage, which may have contributed to his symptoms. No fever, headaches, or dizziness. He has been using a nebulizer, which provides relief, but has not used it yet today.  He has a history of COPD and is currently on a daily steroid. His caregiver mentions that sometimes the dose needs to be increased during exacerbations. He has previously been treated with antibiotics such as Levaquin  and doxycycline , with the latter being more effective recently.  He has a history of falls and recent MRI findings of cerebellar infarcts. He has not had recent home health visits for balance issues, but there is a history of such interventions in the past.  He is currently taking Cymbalta  for depression, which seems to be helping, although there is some agitation noted. He also uses Xanax  as needed for anxiety, but has been cautious with  its use due to potential respiratory depression during COPD flare-ups.       Past Medical History:  Diagnosis Date   Abnormal chest CT 02/01/2024   Absence of bladder continence 01/08/2022   Acquired hypothyroidism 03/02/2024   Acute infection of nasal sinus 01/13/2023   Acute non-recurrent maxillary sinusitis 01/13/2023   Allergy 1989   Anxiety state 02/02/2022   Arthritis    Asymptomatic LV dysfunction 10/18/2017   Atherosclerosis of aorta (HCC) 01/13/2023   B12 deficiency anemia 08/25/2021   Basal cell carcinoma    Benign prostatic hyperplasia with incomplete bladder emptying 01/14/2019   Cancer (HCC)    throat - 1997, throat - 2018   Cardiomyopathy, secondary (HCC) 12/01/2016   Overview:  EF 47% 12/26/16   CHF (congestive heart failure) (HCC) 10/2002   Chronic coronary artery disease 10/18/2017   Chronic cough 12/16/2023   Chronic ischemic heart disease 11/13/1998   Jan 22, 2003 Entered By: Don [name] Comment:  massive Mi in 2000 per patient hsitory, 2nd Mi in 2001Mar 11, 2004 Entered By: Don [name] Comment: Had stent placedin 2000,cath/angio in 2001   Chronic kidney disease    Chronic neck pain 05/16/2022   Chronic pain syndrome 06/04/2022   Chronic respiratory failure with hypoxia (HCC) 05/16/2022   COPD (chronic obstructive pulmonary disease) (HCC)    COPD exacerbation (HCC) 07/30/2023   Coronary artery disease involving native coronary artery of native heart with angina pectoris (HCC) 12/01/2016   Overview:  He  has hx of IWMI in remote past, last cath in 2012 at [facility] showed chronic total occlusion of previously stented RCA, with good collaterals, a 40% LAD stenosis, inferior hypokinesis and EF 45%.    He's been lost to Cardiology f/u since 2015, but has not had any recurrent events   Deficiency anemia 08/25/2021   Diabetic polyneuropathy associated with type 2 diabetes mellitus (HCC) 05/16/2022   Dyslipidemia 12/01/2016   Emphysema of lung (HCC)    Encounter  for Medicare annual wellness exam 01/13/2023   Erectile dysfunction due to diseases classified elsewhere 06/12/2019   GAD (generalized anxiety disorder) 02/02/2022   GERD (gastroesophageal reflux disease)    Hallucination 10/08/2022   Hyperlipidemia    Hypertension    Hypertensive heart disease with heart failure (HCC)    Insomnia 01/28/2021   Iron  deficiency 07/07/2023   Iron  deficiency anemia due to chronic blood loss 08/25/2021   Lesion of vocal cord    Leukoplakia of larynx 11/15/2016   Long term (current) use of systemic steroids 12/16/2023   Long-term use of aspirin  therapy 10/02/2018   Lumbar back pain 06/04/2022   Lung nodule 11/30/2023   Lung nodules 08/17/2022   Macular degeneration of both eyes 03/02/2024   Malfunction of penile prosthesis (HCC) 01/14/2019   Malignant neoplasm of skin 01/28/2021   Formatting of this note might be different from the original. Jan 22, 2003 Entered By: Don [name] Comment: of skin - removed x2 inpast Jan 22, 2003 Entered By: Don [name] Comment: of skin - removed x2 inpast   Melanoma of back (HCC)    melanoma on back   Don loss 03/08/2020   Mixed simple and mucopurulent chronic bronchitis (HCC) 03/02/2024   Moderate recurrent major depression (HCC) 03/02/2023   Mucopurulent chronic bronchitis (HCC) 04/08/2023   Myocardial infarction (HCC) 2000   Oropharyngeal dysphagia 08/12/2018   Other ill-defined and unknown causes of morbidity and mortality 11/13/1993   Formatting of this note might be different from the original. Jan 31, 2008 Entered By: Don [name] Comment: lumbar, 8/08 Jan 22, 2003 Entered By: Don [name] Comment: x1yrs in work place  quit 70yrs ago in 2000 Jan 22, 2003 Entered By: Don [name] Comment: x41yrs in work place  quit 84yrs ago in 2000 Jan 31, 2008 Entered By: Don [name] Comment: lumbar, 8/08   Oxygen  deficiency    PAD (peripheral artery disease) (HCC) 10/18/2017   Peripheral vascular disease (HCC)     iliac artery clot   Peyronie's disease 06/12/2019   PVC (premature ventricular contraction) 10/09/2023   RBBB 10/09/2023   Sleep apnea    Squamous cell carcinoma of larynx (HCC) 12/13/2016   Steroid myopathy 02/01/2024   Stroke (HCC)    Tia they found on MRI   Throat cancer (HCC)    Tobacco use disorder 06/12/2019   Type 2 diabetes mellitus with hyperglycemia, with long-term current use of insulin  (HCC) 07/30/2023   URI, acute 05/31/2023   Urinary urgency 04/08/2023   Ventricular ectopy 11/09/2022   Vertigo 04/08/2023   Vitamin D  deficiency disease 01/13/2023    Past Surgical History:  Procedure Laterality Date   ABDOMINAL ANGIOGRAM N/A 01/13/2015   Procedure: ABDOMINAL ANGIOGRAM;  Surgeon: Krystal JULIANNA Doing, MD;  Location: [facility] CATH LAB;  Service: Cardiovascular;  Laterality: N/A;   ABDOMINAL AORTOGRAM W/LOWER EXTREMITY N/A 05/27/2024   Procedure: ABDOMINAL AORTOGRAM W/LOWER EXTREMITY;  Surgeon: Serene Gaile ORN, MD;  Location: [facility] INVASIVE CV LAB;  Service: Cardiovascular;  Laterality: N/A;   APPENDECTOMY  BACK SURGERY     CARDIAC CATHETERIZATION  2000/2012   with stents in 2000   CHOLECYSTECTOMY     COLONOSCOPY     ELBOW SURGERY     bilateral   ESOPHAGOGASTRODUODENOSCOPY     KNEE SURGERY Left    LOWER EXTREMITY ANGIOGRAPHY N/A 05/27/2024   Procedure: Lower Extremity Angiography;  Surgeon: Serene Gaile ORN, MD;  Location: [facility] INVASIVE CV LAB;  Service: Cardiovascular;  Laterality: N/A;   LOWER EXTREMITY INTERVENTION N/A 05/27/2024   Procedure: LOWER EXTREMITY INTERVENTION;  Surgeon: Serene Gaile ORN, MD;  Location: [facility] INVASIVE CV LAB;  Service: Cardiovascular;  Laterality: N/A;   MICROLARYNGOSCOPY WITH CO2 LASER AND EXCISION OF VOCAL CORD LESION N/A 11/23/2016   Procedure: MICROLARYNGOSCOPY  AND EXCISION OF VOCAL CORD LESION;  Surgeon: Norleen Notice, MD;  Location: [facility] OR;  Service: ENT;  Laterality: N/A;   MICROLARYNGOSCOPY WITH CO2 LASER AND EXCISION OF VOCAL CORD LESION N/A  01/11/2017   Procedure: MICROLARYNGOSCOPY WITH CO2 LASER AND EXCISION OF VOCAL CORD LESION;  Surgeon: Norleen Notice, MD;  Location: [facility] OR;  Service: ENT;  Laterality: N/A;   MICROLARYNGOSCOPY WITH LASER N/A 03/16/2015   Procedure: MICROLARYNGOSCOPY ;  Surgeon: Norleen Notice, MD;  Location: [facility] OR;  Service: ENT;  Laterality: N/A;   peyronie's surgery     SHOULDER SURGERY Right    rotator cuff   SPINE SURGERY  09/2020   cervical surgery. steel plate, bone spurs, allograft.   THROAT SURGERY  1997   cancer removed    Family History  Problem Relation Age of Onset   Cancer Mother        bone   Hypertension Mother    Stroke Father    Hypertension Father    Healthy Child    Cancer Other        brain   Diabetes Other     Social History   Socioeconomic History   Marital status: Married    Spouse name: Not on file   Number of children: 4   Years of education: Not on file   Highest education level: GED or equivalent  Occupational History   Occupation: retired  Tobacco Use   Smoking status: Every Day    Current packs/day: 1.00    Average packs/day: 1 pack/day for 50.0 years (50.0 ttl pk-yrs)    Types: Cigarettes   Smokeless tobacco: Never   Tobacco comments:    down to .5 ppd  11/02/2022 hfb  Vaping Use   Vaping status: Former  Substance and Sexual Activity   Alcohol use: Not on file   Drug use: No   Sexual activity: Not on file  Other Topics Concern   Not on file  Social History Narrative   Lives with wife       One level      Right hand   Social Drivers of Health   Financial Resource Strain: Low Risk  (04/29/2024)   Overall Financial Resource Strain (CARDIA)    Difficulty of Paying Living Expenses: Not hard at all  Food Insecurity: No Food Insecurity (04/29/2024)   Hunger Vital Sign    Worried About Running Out of Food in the Last Year: Never true    Ran Out of Food in the Last Year: Never true  Transportation Needs: No Transportation Needs (04/29/2024)   PRAPARE -  Administrator, Civil Service (Medical): No    Lack of Transportation (Non-Medical): No  Physical Activity: Inactive (04/29/2024)   Exercise Vital Sign  Days of Exercise per Week: 0 days    Minutes of Exercise per Session: Not on file  Stress: Stress Concern Present (04/29/2024)   [employer] of Occupational Health - Occupational Stress Questionnaire    Feeling of Stress: To some extent  Social Connections: Socially Isolated (04/29/2024)   Social Connection and Isolation Panel    Frequency of Communication with Friends and Family: Once a week    Frequency of Social Gatherings with Friends and Family: Never    Attends Religious Services: Never    Database administrator or Organizations: No    Attends Engineer, structural: Not on file    Marital Status: Married  Catering manager Violence: Not At Risk (01/11/2023)   Humiliation, Afraid, Rape, and Kick questionnaire    Fear of Current or Ex-Partner: No    Emotionally Abused: No    Physically Abused: No    Sexually Abused: No    Outpatient Medications Prior to Visit  Medication Sig Dispense Refill   ALPRAZolam  (XANAX ) 0.25 MG tablet Take 1 tablet by mouth twice daily as needed for anxiety 60 tablet 0   arformoterol  (BROVANA ) 15 MCG/2ML NEBU Take 2 mLs (15 mcg total) by nebulization 2 (two) times daily. 120 mL 6   aspirin  EC 81 MG tablet Take 1 tablet (81 mg total) by mouth daily. Swallow whole. 150 tablet 2   budesonide  (PULMICORT ) 0.25 MG/2ML nebulizer solution Take 2 mLs (0.25 mg total) by nebulization in the morning and at bedtime. 60 mL 11   busPIRone  (BUSPAR ) 7.5 MG tablet Take 1 tablet by mouth twice daily 60 tablet 3   clopidogrel  (PLAVIX ) 75 MG tablet Take 1 tablet by mouth once daily 90 tablet 0   cyanocobalamin  (VITAMIN B12) 1000 MCG/ML injection INJECT 1 ML (CC) INTRAMUSCULARLY ONCE A WEEK 10 mL 0   cyclobenzaprine  (FLEXERIL ) 10 MG tablet Take 1 tablet by mouth three times daily as needed for muscle  spasm 90 tablet 0   DULoxetine  (CYMBALTA ) 60 MG capsule Take 1 capsule by mouth twice daily 60 capsule 0   Dupilumab  (DUPIXENT ) 300 MG/2ML SOAJ Inject 300 mg into the skin every 14 (fourteen) days. 12 mL 1   finasteride  (PROSCAR ) 5 MG tablet Take 1 tablet by mouth once daily 90 tablet 0   furosemide  (LASIX ) 20 MG tablet Take 1 tablet (20 mg total) by mouth daily. 90 tablet 1   gabapentin  (NEURONTIN ) 400 MG capsule TAKE 1 CAPSULE BY MOUTH THREE TIMES DAILY 270 capsule 1   insulin  lispro (HUMALOG ) 100 UNIT/ML injection Inject 0.04 mLs (4 Units total) into the skin 3 (three) times daily with meals. (Patient taking differently: Inject 5 Units into the skin 3 (three) times daily as needed for high blood sugar (higher than 400 give 5 units).) 10 mL 11   Insulin  Pen Needle (PEN NEEDLES) 31G X 8 MM MISC Inject insulin  as directed 100 each 1   ipratropium-albuterol  (DUONEB) 0.5-2.5 (3) MG/3ML SOLN USE 1 VIAL IN NEBULIZER 4 TIMES DAILY (Patient taking differently: Take 3 mLs by nebulization 2 (two) times daily.) 360 mL 0   Lancets (ONETOUCH DELICA PLUS LANCET33G) MISC USE 1  TO CHECK GLUCOSE THREE TIMES DAILY (Patient taking differently: 1 each by Other route 3 (three) times daily.) 100 each 0   levothyroxine  (SYNTHROID ) 25 MCG tablet Take 1 tablet by mouth once daily 90 tablet 0   losartan  (COZAAR ) 50 MG tablet Take 1 tablet (50 mg total) by mouth daily. 90 tablet 2  Magnesium  Chloride (MAG64) 64 MG TBEC Take 1 tablet (64 mg total) by mouth daily. 90 tablet 1   metFORMIN  (GLUCOPHAGE ) 1000 MG tablet Take 1,000 mg by mouth 2 (two) times daily.     metFORMIN  (GLUCOPHAGE ) 500 MG tablet Take 2 tablets (1,000 mg total) by mouth 2 (two) times daily with a meal.     metoprolol  succinate (TOPROL -XL) 100 MG 24 hr tablet Take 1 tablet (100 mg total) by mouth daily. Take with or immediately following a meal. 90 tablet 2   nitroGLYCERIN  (NITROSTAT ) 0.4 MG SL tablet Place 1 tablet (0.4 mg total) under the tongue every 5  (five) minutes as needed for chest pain. DISSOLVE ONE TABLET UNDER THE TONGUE EVERY 5 TO 10 MINUTES PRIOR TO ACTIVITIES WHICH MIGHT PRECIPITATE AN ATTACK 25 tablet 0   Omega-3 Fatty Acids (FISH OIL ) 1000 MG CAPS Take 2 capsules (2,000 mg total) by mouth in the morning and at bedtime. 180 capsule 12   ONETOUCH ULTRA test strip USE 1 STRIP TO CHECK GLUCOSE IN THE MORNING AND 1 AT BEDTIME 100 each 0   oxyCODONE -acetaminophen  (PERCOCET/ROXICET) 5-325 MG tablet Take 1 tablet by mouth 2 (two) times daily as needed for severe pain (pain score 7-10). 60 tablet 0   pantoprazole  (PROTONIX ) 40 MG tablet Take 1 tablet by mouth once daily 90 tablet 0   predniSONE  (DELTASONE ) 10 MG tablet Take 1 tablet by mouth once daily with breakfast 30 tablet 5   RELION INSULIN  SYRINGE 31G X 15/64 0.3 ML MISC AS DIRECTED 100 each 0   revefenacin  (YUPELRI ) 175 MCG/3ML nebulizer solution Take 3 mLs (175 mcg total) by nebulization daily. 90 mL 11   rosuvastatin  (CRESTOR ) 20 MG tablet Take 1 tablet (20 mg total) by mouth daily. 90 tablet 3   sodium chloride  HYPERTONIC 3 % nebulizer solution 3 ml twice daily via nebulizer (Patient taking differently: Take 4 mLs by nebulization 2 (two) times daily.) 180 mL 11   spironolactone  (ALDACTONE ) 25 MG tablet Take 1 tablet by mouth once daily 90 tablet 1   tamsulosin  (FLOMAX ) 0.4 MG CAPS capsule TAKE 2 CAPSULES BY MOUTH ONCE DAILY AFTER SUPPER 180 capsule 0   torsemide  (DEMADEX ) 20 MG tablet Take 1 tablet (20 mg total) by mouth as needed (edema). (Patient taking differently: Take 20 mg by mouth daily.) 30 tablet 0   TOUJEO  SOLOSTAR 300 UNIT/ML Solostar Pen Inject 40 Units into the skin daily. Inject 40 units daily, increase by 2 units every 3 days until sugars are constantly 140 or lower. Max dose of 60 units daily. (Patient taking differently: Inject 50 Units into the skin daily. Inject 50 units daily, increase by 2 units every 3 days until sugars are constantly 140 or lower. Max dose of 60  units daily.) 18 mL 0   traZODone  (DESYREL ) 150 MG tablet Take 1 tablet by mouth once daily 270 tablet 0   VENTOLIN  HFA 108 (90 Base) MCG/ACT inhaler INHALE 2 PUFFS BY MOUTH EVERY 6 HOURS AS NEEDED FOR SHORTNESS OF BREATH FOR WHEEZING (Patient taking differently: Inhale 2 puffs into the lungs every 6 (six) hours as needed for shortness of breath or wheezing.) 18 g 0   Vitamin D , Ergocalciferol , (DRISDOL ) 1.25 MG (50000 UNIT) CAPS capsule TAKE 1 CAPSULE BY MOUTH TWICE A WEEK 12 capsule 3   Facility-Administered Medications Prior to Visit  Medication Dose Route Frequency Provider Last Rate Last Admin   ipratropium-albuterol  (DUONEB) 0.5-2.5 (3) MG/3ML nebulizer solution 3 mL  3 mL Nebulization  Once         Allergies  Allergen Reactions   Penicillins Rash and Hives    Has patient had a PCN reaction causing immediate rash, facial/tongue/throat swelling, SOB or lightheadedness with hypotension:unsure Has patient had a PCN reaction causing severe rash involving mucus membranes or skin necrosis:unsure Has patient had a PCN reaction that required hospitalization:No Has patient had a PCN reaction occurring within the last 10 years:No If all of the above answers are NO, then may proceed with Cephalosporin use.  rash   Bactrim  [Sulfamethoxazole -Trimethoprim ]     Dizziness, confusion, involuntary movements   Iodinated Contrast Media Rash    Also developed blisters   Penicillin G Rash   Tramadol Rash     Social History   Tobacco Use   Smoking status: Every Day    Current packs/day: 1.00    Average packs/day: 1 pack/day for 50.0 years (50.0 ttl pk-yrs)    Types: Cigarettes   Smokeless tobacco: Never   Tobacco comments:    down to .5 ppd  11/02/2022 hfb  Vaping Use   Vaping status: Former  Substance Use Topics   Drug use: No     Review of Systems  Respiratory:  Positive for cough.      Labs/Other Tests and Data Reviewed:    Recent Labs: 07/06/2023: NT-Pro BNP 665 06/03/2024: ALT  21; BUN 15; Creatinine, Ser 0.96; Hemoglobin 15.5; Platelets 207; Potassium 4.9; Sodium 141; TSH 1.190   Recent Lipid Panel Lab Results  Component Value Date/Time   CHOL 120 02/28/2024 10:00 AM   TRIG 517 (H) 02/28/2024 10:00 AM   HDL 27 (L) 02/28/2024 10:00 AM   CHOLHDL 4.4 02/28/2024 10:00 AM   LDLCALC 23 02/28/2024 10:00 AM    Wt Readings from Last 3 Encounters:  07/01/24 198 lb (89.8 kg)  06/03/24 196 lb 6.4 oz (89.1 kg)  05/27/24 198 lb (89.8 kg)     Objective:    Vital Signs:  BP 128/70   Pulse (!) 57   Temp 98.8 F (37.1 C)   Ht 5' 10 (1.778 m)   Wt 198 lb (89.8 kg)   SpO2 91%   BMI 28.41 kg/m    Physical Exam   Unable to perform due to it being a video visit  ASSESSMENT & PLAN:   COPD exacerbation (HCC) Assessment & Plan: Sent Doxycycline  100 mg BID x 7 days and Prednisone  20 mg pre-pack take as directed #18. Referring to pharmacy service.  Orders: -     Doxycycline  Hyclate; Take 1 tablet (100 mg total) by mouth 2 (two) times daily.  Dispense: 14 tablet; Refill: 0 -     predniSONE ; Take 3 tablets (60 mg total) by mouth daily with breakfast for 3 days, THEN 2 tablets (40 mg total) daily with breakfast for 3 days, THEN 1 tablet (20 mg total) daily with breakfast for 3 days.  Dispense: 18 tablet; Refill: 0 -     AMB Referral VBCI Care Management  Moderate recurrent major depression (HCC) Assessment & Plan: Recent increase in symptoms due to frequent exacerbations of COPD. Continue to monitor symptoms Controlled with buspar  7.5mg  Denies any suicidal or homicidal ideations.    Balance problem Assessment & Plan: Referring to Home Health.  Orders: -     Ambulatory referral to Home Health  History of stroke in adulthood -     ECHOCARDIOGRAM COMPLETE; Future     Orders Placed This Encounter  Procedures   AMB Referral VBCI Care Management  Ambulatory referral to Home Health   ECHOCARDIOGRAM COMPLETE     Meds ordered this encounter  Medications    doxycycline  (VIBRA -TABS) 100 MG tablet    Sig: Take 1 tablet (100 mg total) by mouth 2 (two) times daily.    Dispense:  14 tablet    Refill:  0   predniSONE  (DELTASONE ) 20 MG tablet    Sig: Take 3 tablets (60 mg total) by mouth daily with breakfast for 3 days, THEN 2 tablets (40 mg total) daily with breakfast for 3 days, THEN 1 tablet (20 mg total) daily with breakfast for 3 days.    Dispense:  18 tablet    Refill:  0    I,Marla I Leal-Borjas,acting as a scribe for [employer], PA.,have documented all relevant documentation on the behalf of Nola Angles, PA,as directed by  Nola Angles, PA while in the presence of Nola Angles, [place].   Follow Up:  In Person prn  Signed, Nola Angles, [place]  07/02/2024 7:46 AM    [facility]

## 2024-07-01 NOTE — Assessment & Plan Note (Deleted)
Week 1: Decrease duloxetine to 60 mg once at night.  Start pristiq 50 mg once daily in am.   Week 2: Stop duloxetine.  Increase pristiq 100 mg once daily in am.  

## 2024-07-02 ENCOUNTER — Other Ambulatory Visit: Payer: Self-pay | Admitting: Family Medicine

## 2024-07-02 ENCOUNTER — Other Ambulatory Visit: Payer: Self-pay

## 2024-07-02 ENCOUNTER — Encounter: Payer: Self-pay | Admitting: Family Medicine

## 2024-07-02 MED ORDER — OXYCODONE-ACETAMINOPHEN 5-325 MG PO TABS: 1.0000 | ORAL_TABLET | Freq: Two times a day (BID) | ORAL | 0 refills | Status: DC | PRN

## 2024-07-02 MED ORDER — OXYCODONE-ACETAMINOPHEN 5-325 MG PO TABS
1.0000 | ORAL_TABLET | Freq: Two times a day (BID) | ORAL | 0 refills | Status: DC | PRN
Start: 2024-07-02 — End: 2024-08-04

## 2024-07-02 NOTE — Assessment & Plan Note (Signed)
 Recent increase in symptoms due to frequent exacerbations of COPD. Continue to monitor symptoms Controlled with buspar  7.5mg  Denies any suicidal or homicidal ideations.

## 2024-07-06 ENCOUNTER — Other Ambulatory Visit: Payer: Self-pay

## 2024-07-08 ENCOUNTER — Telehealth: Payer: Self-pay

## 2024-07-08 NOTE — Progress Notes (Signed)
 Complex Care Management Note Care Guide Note  07/08/2024 Name: Don [name] MRN: [id number] DOB: 1951/02/16   Complex Care Management Outreach Attempts: A second unsuccessful outreach was attempted today to offer the patient with information about available complex care management services.  Follow Up Plan:  Additional outreach attempts will be made to offer the patient complex care management information and services.   Encounter Outcome:  No Answer  Dreama [facility]  [facility], [facility] VBCI Assistant Direct Dial: [phone]  Fax: [phone]

## 2024-07-13 ENCOUNTER — Encounter: Payer: Self-pay | Admitting: Physician Assistant

## 2024-07-15 NOTE — Progress Notes (Signed)
 Complex Care Management Note  Care Guide Note 07/15/2024 Name: Don [name] MRN: [id number] DOB: 06/21/1951  Don [name] is a 73 y.o. year old male who sees Cox, Kirsten, MD for primary care. I reached out to [employer] by phone today to offer complex care management services.  Don [name] was given information about Complex Care Management services today including:   The Complex Care Management services include support from the care team which includes your Nurse Care Manager, Clinical Social Worker, or Pharmacist.  The Complex Care Management team is here to help remove barriers to the health concerns and goals most important to you. Complex Care Management services are voluntary, and the patient may decline or stop services at any time by request to their care team member.   Complex Care Management Consent Status: Patient agreed to services and verbal consent obtained.   Follow up plan:  Telephone appointment with complex care management team member scheduled for:  07/15/24 at 11:00 a.m.   Encounter Outcome:  Patient Scheduled  Dreama [facility]  [facility], [facility] VBCI Assistant Direct Dial: [phone]  Fax: [phone]

## 2024-07-16 ENCOUNTER — Other Ambulatory Visit

## 2024-07-16 NOTE — Progress Notes (Unsigned)
   07/16/2024  Patient ID: Don [name], male   DOB: November 26, 1950, 73 y.o.   MRN: [id number]  Pharmacy Medication Assistance - reviewed medication related needs, was referred for support of obtaining percussion vest that might be helpful for patient getting rid of mucus.  -also notes toujeo  (300units/ml) has been expensive for them. Agreed on trying for the patient assistance program.  - SmartVest, AffloVest, and InCourage - Devices for High Frequency Chest Wall Oscillation  {Pharmacy Quality Options:29749}  Lang Sieve, PharmD, BCGP Clinical Pharmacist  [phone]

## 2024-07-17 ENCOUNTER — Other Ambulatory Visit: Payer: Self-pay | Admitting: Otolaryngology

## 2024-07-17 DIAGNOSIS — J449 Chronic obstructive pulmonary disease, unspecified: Secondary | ICD-10-CM | POA: Diagnosis not present

## 2024-07-17 DIAGNOSIS — J9610 Chronic respiratory failure, unspecified whether with hypoxia or hypercapnia: Secondary | ICD-10-CM | POA: Diagnosis not present

## 2024-07-17 DIAGNOSIS — R1314 Dysphagia, pharyngoesophageal phase: Secondary | ICD-10-CM

## 2024-07-18 ENCOUNTER — Encounter: Payer: Self-pay | Admitting: Family Medicine

## 2024-07-18 ENCOUNTER — Telehealth: Payer: Self-pay | Admitting: Family Medicine

## 2024-07-18 ENCOUNTER — Other Ambulatory Visit: Payer: Self-pay | Admitting: Family Medicine

## 2024-07-18 DIAGNOSIS — R1312 Dysphagia, oropharyngeal phase: Secondary | ICD-10-CM

## 2024-07-18 NOTE — Telephone Encounter (Signed)
  [facility] Home Health Client Coordination Report

## 2024-07-21 ENCOUNTER — Encounter: Payer: Self-pay | Admitting: Surgery

## 2024-07-21 ENCOUNTER — Ambulatory Visit (HOSPITAL_BASED_OUTPATIENT_CLINIC_OR_DEPARTMENT_OTHER)
Admission: RE | Admit: 2024-07-21 | Discharge: 2024-07-21 | Disposition: A | Discharge status: Home / Self Care | Source: Ambulatory Visit | Attending: Surgery | Admitting: Surgery

## 2024-07-21 ENCOUNTER — Ambulatory Visit: Attending: Surgery | Admitting: Surgery

## 2024-07-21 ENCOUNTER — Ambulatory Visit (HOSPITAL_COMMUNITY)
Admission: RE | Admit: 2024-07-21 | Discharge: 2024-07-21 | Disposition: A | Discharge status: Home / Self Care | Source: Ambulatory Visit | Attending: Surgery | Admitting: Surgery

## 2024-07-21 VITALS — BP 111/72 | HR 80 | Temp 97.8°F | Ht 70.0 in

## 2024-07-21 DIAGNOSIS — I739 Peripheral vascular disease, unspecified: Secondary | ICD-10-CM

## 2024-07-21 DIAGNOSIS — I70213 Atherosclerosis of native arteries of extremities with intermittent claudication, bilateral legs: Secondary | ICD-10-CM | POA: Insufficient documentation

## 2024-07-21 LAB — VAS US ABI WITH/WO TBI
Left ABI: 0.73
Right ABI: 0.69

## 2024-07-21 NOTE — Progress Notes (Signed)
 [facility]  Patient name: Don [name] MRN: [id number] DOB: February 26, 1951 Sex: male   REASON FOR VISIT:    Follow up  HISOTRY OF PRESENT ILLNESS:    Don [name] is a 73 y.o. male with a history of left common iliac stent placed in the mid 2000's in [place] .  I performed left external iliac stenting in 2016.  He was lost to follow-up but I saw him in July 2025 when he was complaining of leg weakness and cramping.  He had a significant drop in his ABIs and so I recommended angiography, which was performed on 05/27/2014.  He was found to have an 80% right common iliac stenosis that was treated with stenting.  He did have diffuse femoral-popliteal disease bilaterally.  He states that his feet are warmer and he does not have any more symptoms  The patient is medically managed for hypertension. He takes a statin for hypercholesterolemia. He has COPD secondary to smoking. He has a history of PCI for coronary artery disease. He has been treated for throat cancer.  PAST MEDICAL HISTORY:   Past Medical History:  Diagnosis Date   Abnormal chest CT 02/01/2024   Absence of bladder continence 01/08/2022   Acquired hypothyroidism 03/02/2024   Acute infection of nasal sinus 01/13/2023   Acute non-recurrent maxillary sinusitis 01/13/2023   Allergy 1989   Anxiety state 02/02/2022   Arthritis    Asymptomatic LV dysfunction 10/18/2017   Atherosclerosis of aorta (HCC) 01/13/2023   B12 deficiency anemia 08/25/2021   Basal cell carcinoma    Benign prostatic hyperplasia with incomplete bladder emptying 01/14/2019   Cancer (HCC)    throat - 1997, throat - 2018   Cardiomyopathy, secondary (HCC) 12/01/2016   Overview:  EF 47% 12/26/16   CHF (congestive heart failure) (HCC) 10/2002   Chronic coronary artery disease 10/18/2017   Chronic cough 12/16/2023   Chronic ischemic heart disease 11/13/1998   Jan 22, 2003 Entered By:  CHRISTOPHER OLIVE J Comment:  massive Mi in 2000 per patient hsitory, 2nd Mi in 2001Mar 11, 2004 Entered By: CHRISTOPHER OLIVE J Comment: Had stent placedin 2000,cath/angio in 2001   Chronic kidney disease    Chronic neck pain 05/16/2022   Chronic pain syndrome 06/04/2022   Chronic respiratory failure with hypoxia (HCC) 05/16/2022   COPD (chronic obstructive pulmonary disease) (HCC)    COPD exacerbation (HCC) 07/30/2023   Coronary artery disease involving native coronary artery of native heart with angina pectoris (HCC) 12/01/2016   Overview:  He has hx of IWMI in remote past, last cath in 2012 at [facility] showed chronic total occlusion of previously stented RCA, with good collaterals, a 40% LAD stenosis, inferior hypokinesis and EF 45%.    He's been lost to Cardiology f/u since 2015, but has not had any recurrent events   Deficiency anemia 08/25/2021   Diabetic polyneuropathy associated with type 2 diabetes mellitus (HCC) 05/16/2022   Dyslipidemia 12/01/2016   Emphysema of lung (HCC)    Encounter for Medicare annual wellness exam 01/13/2023   Erectile dysfunction due to diseases classified elsewhere 06/12/2019   GAD (generalized anxiety disorder) 02/02/2022   GERD (gastroesophageal reflux disease)    Hallucination 10/08/2022   Hyperlipidemia    Hypertension    Hypertensive heart disease with heart failure (HCC)    Insomnia 01/28/2021   Iron  deficiency 07/07/2023   Iron  deficiency anemia due to chronic blood loss 08/25/2021   Lesion of vocal cord    Leukoplakia of larynx  11/15/2016   Long term (current) use of systemic steroids 12/16/2023   Long-term use of aspirin  therapy 10/02/2018   Lumbar back pain 06/04/2022   Lung nodule 11/30/2023   Lung nodules 08/17/2022   Macular degeneration of both eyes 03/02/2024   Malfunction of penile prosthesis (HCC) 01/14/2019   Malignant neoplasm of skin 01/28/2021   Formatting of this note might be different from the original. Jan 22, 2003 Entered By:  CHRISTOPHER OLIVE J Comment: of skin - removed x2 inpast Jan 22, 2003 Entered By: CHRISTOPHER OLIVE J Comment: of skin - removed x2 inpast   Melanoma of back (HCC)    melanoma on back   Memory loss 03/08/2020   Mixed simple and mucopurulent chronic bronchitis (HCC) 03/02/2024   Moderate recurrent major depression (HCC) 03/02/2023   Mucopurulent chronic bronchitis (HCC) 04/08/2023   Myocardial infarction (HCC) 2000   Oropharyngeal dysphagia 08/12/2018   Other ill-defined and unknown causes of morbidity and mortality 11/13/1993   Formatting of this note might be different from the original. Jan 31, 2008 Entered By: ARMOUR,ROSS B Comment: lumbar, 8/08 Jan 22, 2003 Entered By: CHRISTOPHER OLIVE J Comment: x73yrs in work place  quit 21yrs ago in 2000 Jan 22, 2003 Entered By: CHRISTOPHER OLIVE J Comment: x15yrs in work place  quit 48yrs ago in 2000 Jan 31, 2008 Entered By: MEMORY GULL B Comment: lumbar, 8/08   Oxygen  deficiency    PAD (peripheral artery disease) (HCC) 10/18/2017   Peripheral vascular disease (HCC)    iliac artery clot   Peyronie's disease 06/12/2019   PVC (premature ventricular contraction) 10/09/2023   RBBB 10/09/2023   Sleep apnea    Squamous cell carcinoma of larynx (HCC) 12/13/2016   Steroid myopathy 02/01/2024   Stroke (HCC)    Tia they found on MRI   Throat cancer (HCC)    Tobacco use disorder 06/12/2019   Type 2 diabetes mellitus with hyperglycemia, with long-term current use of insulin  (HCC) 07/30/2023   URI, acute 05/31/2023   Urinary urgency 04/08/2023   Ventricular ectopy 11/09/2022   Vertigo 04/08/2023   Vitamin D  deficiency disease 01/13/2023     FAMILY HISTORY:   Family History  Problem Relation Age of Onset   Cancer Mother        bone   Hypertension Mother    Stroke Father    Hypertension Father    Healthy Child    Cancer Other        brain   Diabetes Other     SOCIAL HISTORY:   Social History   Tobacco Use   Smoking status: Every Day    Current  packs/day: 1.00    Average packs/day: 1 pack/day for 50.0 years (50.0 ttl pk-yrs)    Types: Cigarettes   Smokeless tobacco: Never   Tobacco comments:    down to .5 ppd  11/02/2022 hfb  Substance Use Topics   Alcohol use: Not on file     ALLERGIES:   Allergies  Allergen Reactions   Penicillins Rash and Hives    Has patient had a PCN reaction causing immediate rash, facial/tongue/throat swelling, SOB or lightheadedness with hypotension:unsure Has patient had a PCN reaction causing severe rash involving mucus membranes or skin necrosis:unsure Has patient had a PCN reaction that required hospitalization:No Has patient had a PCN reaction occurring within the last 10 years:No If all of the above answers are NO, then may proceed with Cephalosporin use.  rash   Bactrim  [Sulfamethoxazole -Trimethoprim ]     Dizziness, confusion, involuntary movements   Iodinated  Contrast Media Rash    Also developed blisters   Penicillin G Rash   Tramadol Rash     CURRENT MEDICATIONS:   Current Outpatient Medications  Medication Sig Dispense Refill   ALPRAZolam  (XANAX ) 0.25 MG tablet Take 1 tablet by mouth twice daily as needed for anxiety 60 tablet 2   arformoterol  (BROVANA ) 15 MCG/2ML NEBU Take 2 mLs (15 mcg total) by nebulization 2 (two) times daily. 120 mL 6   aspirin  EC 81 MG tablet Take 1 tablet (81 mg total) by mouth daily. Swallow whole. 150 tablet 2   budesonide  (PULMICORT ) 0.25 MG/2ML nebulizer solution Take 2 mLs (0.25 mg total) by nebulization in the morning and at bedtime. 60 mL 11   busPIRone  (BUSPAR ) 7.5 MG tablet Take 1 tablet by mouth twice daily 60 tablet 3   clopidogrel  (PLAVIX ) 75 MG tablet Take 1 tablet by mouth once daily 90 tablet 3   cyanocobalamin  (VITAMIN B12) 1000 MCG/ML injection INJECT 1 ML (CC) INTRAMUSCULARLY ONCE A WEEK 10 mL 0   cyclobenzaprine  (FLEXERIL ) 10 MG tablet Take 1 tablet by mouth three times daily as needed for muscle spasm 90 tablet 0   doxycycline   (VIBRA -TABS) 100 MG tablet Take 1 tablet (100 mg total) by mouth 2 (two) times daily. 14 tablet 0   DULoxetine  (CYMBALTA ) 60 MG capsule Take 1 capsule by mouth twice daily 60 capsule 0   Dupilumab  (DUPIXENT ) 300 MG/2ML SOAJ Inject 300 mg into the skin every 14 (fourteen) days. 12 mL 1   finasteride  (PROSCAR ) 5 MG tablet Take 1 tablet by mouth once daily 90 tablet 0   furosemide  (LASIX ) 20 MG tablet Take 1 tablet (20 mg total) by mouth daily. 90 tablet 1   gabapentin  (NEURONTIN ) 400 MG capsule TAKE 1 CAPSULE BY MOUTH THREE TIMES DAILY 270 capsule 1   insulin  lispro (HUMALOG ) 100 UNIT/ML injection Inject 0.04 mLs (4 Units total) into the skin 3 (three) times daily with meals. (Patient taking differently: Inject 5 Units into the skin 3 (three) times daily as needed for high blood sugar (higher than 400 give 5 units).) 10 mL 11   Insulin  Pen Needle (PEN NEEDLES) 31G X 8 MM MISC Inject insulin  as directed 100 each 1   ipratropium-albuterol  (DUONEB) 0.5-2.5 (3) MG/3ML SOLN USE 1 VIAL IN NEBULIZER 4 TIMES DAILY (Patient taking differently: Take 3 mLs by nebulization 2 (two) times daily.) 360 mL 0   Lancets (ONETOUCH DELICA PLUS LANCET33G) MISC USE 1  TO CHECK GLUCOSE THREE TIMES DAILY (Patient taking differently: 1 each by Other route 3 (three) times daily.) 100 each 0   levothyroxine  (SYNTHROID ) 25 MCG tablet Take 1 tablet by mouth once daily 90 tablet 0   losartan  (COZAAR ) 50 MG tablet Take 1 tablet (50 mg total) by mouth daily. 90 tablet 2   Magnesium  Chloride (MAG64) 64 MG TBEC Take 1 tablet (64 mg total) by mouth daily. 90 tablet 1   metFORMIN  (GLUCOPHAGE ) 1000 MG tablet Take 1,000 mg by mouth 2 (two) times daily.     metFORMIN  (GLUCOPHAGE ) 500 MG tablet Take 2 tablets (1,000 mg total) by mouth 2 (two) times daily with a meal.     metoprolol  succinate (TOPROL -XL) 100 MG 24 hr tablet Take 1 tablet (100 mg total) by mouth daily. Take with or immediately following a meal. 90 tablet 2   nitroGLYCERIN   (NITROSTAT ) 0.4 MG SL tablet Place 1 tablet (0.4 mg total) under the tongue every 5 (five) minutes as needed for chest pain.  DISSOLVE ONE TABLET UNDER THE TONGUE EVERY 5 TO 10 MINUTES PRIOR TO ACTIVITIES WHICH MIGHT PRECIPITATE AN ATTACK 25 tablet 0   Omega-3 Fatty Acids (FISH OIL ) 1000 MG CAPS Take 2 capsules (2,000 mg total) by mouth in the morning and at bedtime. 180 capsule 12   ONETOUCH ULTRA test strip USE 1 STRIP TO CHECK GLUCOSE IN THE MORNING AND 1 AT BEDTIME 100 each 0   oxyCODONE -acetaminophen  (PERCOCET/ROXICET) 5-325 MG tablet Take 1 tablet by mouth 2 (two) times daily as needed for severe pain (pain score 7-10). 60 tablet 0   pantoprazole  (PROTONIX ) 40 MG tablet Take 1 tablet by mouth once daily 90 tablet 0   predniSONE  (DELTASONE ) 10 MG tablet Take 1 tablet by mouth once daily with breakfast 30 tablet 5   RELION INSULIN  SYRINGE 31G X 15/64 0.3 ML MISC AS DIRECTED 100 each 0   revefenacin  (YUPELRI ) 175 MCG/3ML nebulizer solution Take 3 mLs (175 mcg total) by nebulization daily. 90 mL 11   rosuvastatin  (CRESTOR ) 20 MG tablet Take 1 tablet (20 mg total) by mouth daily. 90 tablet 3   sodium chloride  HYPERTONIC 3 % nebulizer solution 3 ml twice daily via nebulizer (Patient taking differently: Take 4 mLs by nebulization 2 (two) times daily.) 180 mL 11   spironolactone  (ALDACTONE ) 25 MG tablet Take 1 tablet by mouth once daily 90 tablet 1   tamsulosin  (FLOMAX ) 0.4 MG CAPS capsule TAKE 2 CAPSULES BY MOUTH ONCE DAILY AFTER SUPPER 180 capsule 0   torsemide  (DEMADEX ) 20 MG tablet Take 1 tablet (20 mg total) by mouth as needed (edema). (Patient taking differently: Take 20 mg by mouth daily.) 30 tablet 0   TOUJEO  SOLOSTAR 300 UNIT/ML Solostar Pen Inject 40 Units into the skin daily. Inject 40 units daily, increase by 2 units every 3 days until sugars are constantly 140 or lower. Max dose of 60 units daily. (Patient taking differently: Inject 50 Units into the skin daily. Inject 50 units daily,  increase by 2 units every 3 days until sugars are constantly 140 or lower. Max dose of 60 units daily.) 18 mL 0   traZODone  (DESYREL ) 150 MG tablet Take 1 tablet by mouth once daily 270 tablet 0   VENTOLIN  HFA 108 (90 Base) MCG/ACT inhaler INHALE 2 PUFFS BY MOUTH EVERY 6 HOURS AS NEEDED FOR SHORTNESS OF BREATH FOR WHEEZING (Patient taking differently: Inhale 2 puffs into the lungs every 6 (six) hours as needed for shortness of breath or wheezing.) 18 g 0   Vitamin D , Ergocalciferol , (DRISDOL ) 1.25 MG (50000 UNIT) CAPS capsule TAKE 1 CAPSULE BY MOUTH TWICE A WEEK 12 capsule 3   Current Facility-Administered Medications  Medication Dose Route Frequency Provider Last Rate Last Admin   ipratropium-albuterol  (DUONEB) 0.5-2.5 (3) MG/3ML nebulizer solution 3 mL  3 mL Nebulization Once         REVIEW OF SYSTEMS:   [X]  denotes positive finding, [ ]  denotes negative finding Cardiac  Comments:  Chest pain or chest pressure:    Shortness of breath upon exertion:    Short of breath when lying flat:    Irregular heart rhythm:        Vascular    Pain in calf, thigh, or hip brought on by ambulation:    Pain in feet at night that wakes you up from your sleep:     Blood clot in your veins:    Leg swelling:         Pulmonary    Oxygen   at home:    Productive cough:     Wheezing:         Neurologic    Sudden weakness in arms or legs:     Sudden numbness in arms or legs:     Sudden onset of difficulty speaking or slurred speech:    Temporary loss of vision in one eye:     Problems with dizziness:         Gastrointestinal    Blood in stool:     Vomited blood:         Genitourinary    Burning when urinating:     Blood in urine:        Psychiatric    Major depression:         Hematologic    Bleeding problems:    Problems with blood clotting too easily:        Skin    Rashes or ulcers:        Constitutional    Fever or chills:      PHYSICAL EXAM:   Vitals:   07/21/24 0958  BP:  111/72  Pulse: 80  Temp: 97.8 F (36.6 C)  SpO2: 95%  Height: 5' 10 (1.778 m)    GENERAL: The patient is a well-nourished male, in no acute distress. The vital signs are documented above. CARDIAC: There is a regular rate and rhythm.  VASCULAR: Nonpalpable pedal pulses PULMONARY: Non-labored respirations ABDOMEN: Soft and non-tender  MUSCULOSKELETAL: There are no major deformities or cyanosis. NEUROLOGIC: No focal weakness or paresthesias are detected. SKIN: There are no ulcers or rashes noted. PSYCHIATRIC: The patient has a normal affect.  STUDIES:   I have reviewed the following: +-------+-----------+-----------+------------+------------+  ABI/TBIToday's ABIToday's TBIPrevious ABIPrevious TBI  +-------+-----------+-----------+------------+------------+  Right 0.69       0.72       0.50        0.58          +-------+-----------+-----------+------------+------------+  Left  0.73       0.62       0.82        0.53          +-    Stenosis: +--------------------+-------------+---------------+  Location            Stenosis     Stent            +--------------------+-------------+---------------+  Right Common Iliac  <50% stenosis                 +--------------------+-------------+---------------+  Left Common Iliac   <50% stenosis                 +--------------------+-------------+---------------+  Right External Iliac>50% stenosis                 +--------------------+-------------+---------------+  Left External Iliac              50-99% stenosis  +--------------------+-------------+---------------+   Incidental finding: IMA ostium peak systolic velocity of 548 cm/s,  suggestive of stenosis.  MEDICAL ISSUES:   PAD: The patient underwent successful right iliac stenting.  He has a known proximal left common iliac stenosis at the ostium.  I do not think that his symptoms warrant doing anything about this right now but this will need to  be monitored in the future.  If he gets a recurrence of symptoms, and the etiology is in his iliacs, I would consider a Endologix AFX device to treat his aortic bifurcation disease.  He will  follow-up in 6 months with repeat surveillance imaging    Malvina Serene CLORE, MD, FACS [facility] [phone] Pager [phone]

## 2024-07-23 ENCOUNTER — Telehealth: Payer: Self-pay

## 2024-07-23 NOTE — Telephone Encounter (Signed)
 Verbal ok given to [facility] for request below.   Copied from CRM [id number]. Topic: Clinical - Home Health Verbal Orders >> Jul 22, 2024  2:20 PM Carlatta H wrote: Caller/Agency: Ronal Hun Callback Number: [phone] Service Requested: Speech Therapy Frequency: Move consult to the first week of October Any new concerns about the patient? No

## 2024-07-28 ENCOUNTER — Other Ambulatory Visit: Payer: Self-pay | Admitting: Family Medicine

## 2024-07-28 DIAGNOSIS — R1030 Lower abdominal pain, unspecified: Secondary | ICD-10-CM

## 2024-07-29 NOTE — Progress Notes (Addendum)
 Acute Office Visit  Subjective:    Patient ID: Don [name], male    DOB: 07-05-1951, 73 y.o.   MRN: [id number]  Chief Complaint  Patient presents with   Fatigue    HPI: Patient is in today for fatigue  Discussed the use of AI scribe software for clinical note transcription with the patient, who gave verbal consent to proceed.  History of Present Illness Don [name] is a 74 year old male with diabetes who presents with ongoing abdominal pain and fatigue.  Abdominal pain - Constant abdominal pain for the past 4-6 months - Pain sometimes improves after eating - Occasional radiation to the back - Pain can wake him from sleep - No association with melena or hematemesis - History of hiatal hernia and one prior episode of diverticulitis - CT scan of abdomen and pelvis performed in January  Fatigue and sleep disturbance - Persistent fatigue - Increased thirst - Sleeping well overall but wakes 2-3 times per night - Depression fairly well controlled.  - on xanax , buspirone , cymbalta , trazodone .  Diabetes mellitus and glycemic control - Diabetes mellitus with poor glycemic control - Metformin  500 mg twice daily (reduced from 1000 mg twice daily due to gastrointestinal side effects) - Recent blood glucose readings: 127 mg/dL and 791 mg/dL - Hemoglobin J8r: 89.8 - Toujeo  60 units daily - Humalog  on a sliding scale for hyperglycemia  Dyslipidemia - Hyperlipidemia managed with rosuvastatin  20 mg daily, over-the-counter fish oil , and fenofibrate  - Triglycerides improved from 517 mg/dL to 709 mg/dL - LDL cholesterol is 30 mg/dL  HTN - losartan  50 mg daily.  - toprol  xl 100 mg daily.  - spironolactone  25 mg daily.  - torsemide  20 mg daily as needed.   COPD - Brovana , pulmicort , duoneb. - Prednisone  10 mg daily  - Occasional sweating, attributed to prednisone  use  Vascular disease - History of vascular disease with stent placement in the iliac artery -  Ongoing monitoring by vascular surgery  Constitutional and respiratory symptoms - No sore throat - No nasal congestion - No significant chest pain  Past Medical History:  Diagnosis Date   Abnormal chest CT 02/01/2024   Absence of bladder continence 01/08/2022   Acquired hypothyroidism 03/02/2024   Acute infection of nasal sinus 01/13/2023   Acute non-recurrent maxillary sinusitis 01/13/2023   Allergy 1989   Anxiety state 02/02/2022   Arthritis    Asymptomatic LV dysfunction 10/18/2017   Atherosclerosis of aorta 01/13/2023   B12 deficiency anemia 08/25/2021   Basal cell carcinoma    Benign prostatic hyperplasia with incomplete bladder emptying 01/14/2019   Cancer (HCC)    throat - 1997, throat - 2018   Cardiomyopathy, secondary (HCC) 12/01/2016   Overview:  EF 47% 12/26/16   CHF (congestive heart failure) (HCC) 10/2002   Chronic coronary artery disease 10/18/2017   Chronic cough 12/16/2023   Chronic ischemic heart disease 11/13/1998   Jan 22, 2003 Entered By: CHRISTOPHER OLIVE J Comment:  massive Mi in 2000 per patient hsitory, 2nd Mi in 2001Mar 11, 2004 Entered By: CHRISTOPHER OLIVE J Comment: Had stent placedin 2000,cath/angio in 2001   Chronic kidney disease    Chronic neck pain 05/16/2022   Chronic pain syndrome 06/04/2022   Chronic respiratory failure with hypoxia (HCC) 05/16/2022   COPD (chronic obstructive pulmonary disease) (HCC)    COPD exacerbation (HCC) 07/30/2023   Coronary artery disease involving native coronary artery of native heart with angina pectoris 12/01/2016   Overview:  He has  hx of IWMI in remote past, last cath in 2012 at [facility] showed chronic total occlusion of previously stented RCA, with good collaterals, a 40% LAD stenosis, inferior hypokinesis and EF 45%.    He's been lost to Cardiology f/u since 2015, but has not had any recurrent events   Deficiency anemia 08/25/2021   Diabetic polyneuropathy associated with type 2 diabetes mellitus (HCC) 05/16/2022    Dyslipidemia 12/01/2016   Emphysema of lung (HCC)    Encounter for Medicare annual wellness exam 01/13/2023   Erectile dysfunction due to diseases classified elsewhere 06/12/2019   GAD (generalized anxiety disorder) 02/02/2022   GERD (gastroesophageal reflux disease)    Hallucination 10/08/2022   Hyperlipidemia    Hypertension    Hypertensive heart disease with heart failure (HCC)    Insomnia 01/28/2021   Iron  deficiency 07/07/2023   Iron  deficiency anemia due to chronic blood loss 08/25/2021   Lesion of vocal cord    Leukoplakia of larynx 11/15/2016   Long term (current) use of systemic steroids 12/16/2023   Long-term use of aspirin  therapy 10/02/2018   Lumbar back pain 06/04/2022   Lung nodule 11/30/2023   Lung nodules 08/17/2022   Macular degeneration of both eyes 03/02/2024   Malfunction of penile prosthesis 01/14/2019   Malignant neoplasm of skin 01/28/2021   Formatting of this note might be different from the original. Jan 22, 2003 Entered By: CHRISTOPHER OLIVE J Comment: of skin - removed x2 inpast Jan 22, 2003 Entered By: CHRISTOPHER OLIVE J Comment: of skin - removed x2 inpast   Melanoma of back (HCC)    melanoma on back   Memory loss 03/08/2020   Mixed simple and mucopurulent chronic bronchitis (HCC) 03/02/2024   Moderate recurrent major depression (HCC) 03/02/2023   Mucopurulent chronic bronchitis (HCC) 04/08/2023   Myocardial infarction (HCC) 2000   Oropharyngeal dysphagia 08/12/2018   Other ill-defined and unknown causes of morbidity and mortality 11/13/1993   Formatting of this note might be different from the original. Jan 31, 2008 Entered By: ARMOUR,ROSS B Comment: lumbar, 8/08 Jan 22, 2003 Entered By: CHRISTOPHER OLIVE J Comment: x41yrs in work place  quit 40yrs ago in 2000 Jan 22, 2003 Entered By: CHRISTOPHER OLIVE J Comment: x2yrs in work place  quit 53yrs ago in 2000 Jan 31, 2008 Entered By: MEMORY GULL B Comment: lumbar, 8/08   Oxygen  deficiency    PAD (peripheral artery  disease) 10/18/2017   Peripheral vascular disease    iliac artery clot   Peyronie's disease 06/12/2019   PVC (premature ventricular contraction) 10/09/2023   RBBB 10/09/2023   Sleep apnea    Squamous cell carcinoma of larynx (HCC) 12/13/2016   Steroid myopathy 02/01/2024   Stroke (HCC)    Tia they found on MRI   Throat cancer (HCC)    Tobacco use disorder 06/12/2019   Type 2 diabetes mellitus with hyperglycemia, with long-term current use of insulin  (HCC) 07/30/2023   URI, acute 05/31/2023   Urinary urgency 04/08/2023   Ventricular ectopy 11/09/2022   Vertigo 04/08/2023   Vitamin D  deficiency disease 01/13/2023    Past Surgical History:  Procedure Laterality Date   ABDOMINAL ANGIOGRAM N/A 01/13/2015   Procedure: ABDOMINAL ANGIOGRAM;  Surgeon: Krystal JULIANNA Doing, MD;  Location: [facility] CATH LAB;  Service: Cardiovascular;  Laterality: N/A;   ABDOMINAL AORTOGRAM W/LOWER EXTREMITY N/A 05/27/2024   Procedure: ABDOMINAL AORTOGRAM W/LOWER EXTREMITY;  Surgeon: Serene Gaile ORN, MD;  Location: [facility] INVASIVE CV LAB;  Service: Cardiovascular;  Laterality: N/A;   APPENDECTOMY     BACK  SURGERY     CARDIAC CATHETERIZATION  2000/2012   with stents in 2000   CHOLECYSTECTOMY     COLONOSCOPY     ELBOW SURGERY     bilateral   ESOPHAGOGASTRODUODENOSCOPY     KNEE SURGERY Left    LOWER EXTREMITY ANGIOGRAPHY N/A 05/27/2024   Procedure: Lower Extremity Angiography;  Surgeon: Serene Gaile ORN, MD;  Location: [facility] INVASIVE CV LAB;  Service: Cardiovascular;  Laterality: N/A;   LOWER EXTREMITY INTERVENTION N/A 05/27/2024   Procedure: LOWER EXTREMITY INTERVENTION;  Surgeon: Serene Gaile ORN, MD;  Location: [facility] INVASIVE CV LAB;  Service: Cardiovascular;  Laterality: N/A;   MICROLARYNGOSCOPY WITH CO2 LASER AND EXCISION OF VOCAL CORD LESION N/A 11/23/2016   Procedure: MICROLARYNGOSCOPY  AND EXCISION OF VOCAL CORD LESION;  Surgeon: Norleen Notice, MD;  Location: [facility];  Service: ENT;  Laterality: N/A;   MICROLARYNGOSCOPY WITH  CO2 LASER AND EXCISION OF VOCAL CORD LESION N/A 01/11/2017   Procedure: MICROLARYNGOSCOPY WITH CO2 LASER AND EXCISION OF VOCAL CORD LESION;  Surgeon: Norleen Notice, MD;  Location: [facility] OR;  Service: ENT;  Laterality: N/A;   MICROLARYNGOSCOPY WITH LASER N/A 03/16/2015   Procedure: MICROLARYNGOSCOPY ;  Surgeon: Norleen Notice, MD;  Location: [facility] OR;  Service: ENT;  Laterality: N/A;   peyronie's surgery     SHOULDER SURGERY Right    rotator cuff   SPINE SURGERY  09/2020   cervical surgery. steel plate, bone spurs, allograft.   THROAT SURGERY  1997   cancer removed    Family History  Problem Relation Age of Onset   Cancer Mother        bone   Hypertension Mother    Stroke Father    Hypertension Father    Healthy Child    Cancer Other        brain   Diabetes Other     Social History   Socioeconomic History   Marital status: Married    Spouse name: Not on file   Number of children: 4   Years of education: Not on file   Highest education level: GED or equivalent  Occupational History   Occupation: retired  Tobacco Use   Smoking status: Every Day    Current packs/day: 1.00    Average packs/day: 1 pack/day for 50.0 years (50.0 ttl pk-yrs)    Types: Cigarettes   Smokeless tobacco: Never   Tobacco comments:    down to .5 ppd  11/02/2022 hfb  Vaping Use   Vaping status: Former  Substance and Sexual Activity   Alcohol use: Not on file   Drug use: No   Sexual activity: Not on file  Other Topics Concern   Not on file  Social History Narrative   Lives with wife       One level      Right hand   Social Drivers of Health   Financial Resource Strain: Low Risk  (04/29/2024)   Overall Financial Resource Strain (CARDIA)    Difficulty of Paying Living Expenses: Not hard at all  Food Insecurity: No Food Insecurity (04/29/2024)   Hunger Vital Sign    Worried About Running Out of Food in the Last Year: Never true    Ran Out of Food in the Last Year: Never true  Transportation Needs: No  Transportation Needs (04/29/2024)   PRAPARE - Administrator, Civil Service (Medical): No    Lack of Transportation (Non-Medical): No  Physical Activity: Inactive (04/29/2024)   Exercise Vital Sign  Days of Exercise per Week: 0 days    Minutes of Exercise per Session: Not on file  Stress: Stress Concern Present (04/29/2024)   [employer] of Occupational Health - Occupational Stress Questionnaire    Feeling of Stress: To some extent  Social Connections: Socially Isolated (04/29/2024)   Social Connection and Isolation Panel    Frequency of Communication with Friends and Family: Once a week    Frequency of Social Gatherings with Friends and Family: Never    Attends Religious Services: Never    Database administrator or Organizations: No    Attends Engineer, structural: Not on file    Marital Status: Married  Catering manager Violence: Not At Risk (07/30/2024)   Humiliation, Afraid, Rape, and Kick questionnaire    Fear of Current or Ex-Partner: No    Emotionally Abused: No    Physically Abused: No    Sexually Abused: No    Outpatient Medications Prior to Visit  Medication Sig Dispense Refill   ALPRAZolam  (XANAX ) 0.25 MG tablet Take 1 tablet by mouth twice daily as needed for anxiety 60 tablet 2   arformoterol  (BROVANA ) 15 MCG/2ML NEBU Take 2 mLs (15 mcg total) by nebulization 2 (two) times daily. 120 mL 6   aspirin  EC 81 MG tablet Take 1 tablet (81 mg total) by mouth daily. Swallow whole. 150 tablet 2   budesonide  (PULMICORT ) 0.25 MG/2ML nebulizer solution Take 2 mLs (0.25 mg total) by nebulization in the morning and at bedtime. 60 mL 11   busPIRone  (BUSPAR ) 7.5 MG tablet Take 1 tablet by mouth twice daily 60 tablet 3   clopidogrel  (PLAVIX ) 75 MG tablet Take 1 tablet by mouth once daily 90 tablet 3   cyanocobalamin  (VITAMIN B12) 1000 MCG/ML injection INJECT 1 ML (CC) INTRAMUSCULARLY ONCE A WEEK 10 mL 0   cyclobenzaprine  (FLEXERIL ) 10 MG tablet Take 1 tablet by  mouth three times daily as needed for muscle spasm 90 tablet 0   Dupilumab  (DUPIXENT ) 300 MG/2ML SOAJ Inject 300 mg into the skin every 14 (fourteen) days. 12 mL 1   finasteride  (PROSCAR ) 5 MG tablet Take 1 tablet by mouth once daily 90 tablet 0   gabapentin  (NEURONTIN ) 400 MG capsule TAKE 1 CAPSULE BY MOUTH THREE TIMES DAILY 270 capsule 1   insulin  lispro (HUMALOG ) 100 UNIT/ML injection Inject 0.04 mLs (4 Units total) into the skin 3 (three) times daily with meals. (Patient taking differently: Inject 5 Units into the skin 3 (three) times daily as needed for high blood sugar (higher than 400 give 5 units).) 10 mL 11   Insulin  Pen Needle (PEN NEEDLES) 31G X 8 MM MISC Inject insulin  as directed 100 each 1   ipratropium-albuterol  (DUONEB) 0.5-2.5 (3) MG/3ML SOLN USE 1 VIAL IN NEBULIZER 4 TIMES DAILY (Patient taking differently: Take 3 mLs by nebulization 2 (two) times daily.) 360 mL 0   Lancets (ONETOUCH DELICA PLUS LANCET33G) MISC USE 1  TO CHECK GLUCOSE THREE TIMES DAILY (Patient taking differently: 1 each by Other route 3 (three) times daily.) 100 each 0   levothyroxine  (SYNTHROID ) 25 MCG tablet Take 1 tablet by mouth once daily 90 tablet 0   losartan  (COZAAR ) 50 MG tablet Take 1 tablet (50 mg total) by mouth daily. 90 tablet 2   Magnesium  Chloride (MAG64) 64 MG TBEC Take 1 tablet (64 mg total) by mouth daily. 90 tablet 1   metoprolol  succinate (TOPROL -XL) 100 MG 24 hr tablet Take 1 tablet (100 mg total) by mouth  daily. Take with or immediately following a meal. 90 tablet 2   nitroGLYCERIN  (NITROSTAT ) 0.4 MG SL tablet Place 1 tablet (0.4 mg total) under the tongue every 5 (five) minutes as needed for chest pain. DISSOLVE ONE TABLET UNDER THE TONGUE EVERY 5 TO 10 MINUTES PRIOR TO ACTIVITIES WHICH MIGHT PRECIPITATE AN ATTACK 25 tablet 0   ONETOUCH ULTRA test strip USE 1 STRIP TO CHECK GLUCOSE IN THE MORNING AND 1 AT BEDTIME 100 each 0   pantoprazole  (PROTONIX ) 40 MG tablet Take 1 tablet by mouth once  daily 90 tablet 0   predniSONE  (DELTASONE ) 10 MG tablet Take 1 tablet by mouth once daily with breakfast 30 tablet 5   RELION INSULIN  SYRINGE 31G X 15/64 0.3 ML MISC AS DIRECTED 100 each 0   revefenacin  (YUPELRI ) 175 MCG/3ML nebulizer solution Take 3 mLs (175 mcg total) by nebulization daily. 90 mL 11   rosuvastatin  (CRESTOR ) 20 MG tablet Take 1 tablet (20 mg total) by mouth daily. 90 tablet 3   spironolactone  (ALDACTONE ) 25 MG tablet Take 1 tablet by mouth once daily 90 tablet 1   tamsulosin  (FLOMAX ) 0.4 MG CAPS capsule TAKE 2 CAPSULES BY MOUTH ONCE DAILY AFTER SUPPER 180 capsule 0   torsemide  (DEMADEX ) 20 MG tablet Take 1 tablet (20 mg total) by mouth as needed (edema). (Patient taking differently: Take 20 mg by mouth daily.) 30 tablet 0   TOUJEO  SOLOSTAR 300 UNIT/ML Solostar Pen Inject 40 Units into the skin daily. Inject 40 units daily, increase by 2 units every 3 days until sugars are constantly 140 or lower. Max dose of 60 units daily. (Patient taking differently: Inject 50 Units into the skin daily. Inject 50 units daily, increase by 2 units every 3 days until sugars are constantly 140 or lower. Max dose of 60 units daily.) 18 mL 0   traZODone  (DESYREL ) 150 MG tablet Take 1 tablet by mouth once daily 270 tablet 0   VENTOLIN  HFA 108 (90 Base) MCG/ACT inhaler INHALE 2 PUFFS BY MOUTH EVERY 6 HOURS AS NEEDED FOR SHORTNESS OF BREATH FOR WHEEZING (Patient taking differently: Inhale 2 puffs into the lungs every 6 (six) hours as needed for shortness of breath or wheezing.) 18 g 0   Vitamin D , Ergocalciferol , (DRISDOL ) 1.25 MG (50000 UNIT) CAPS capsule TAKE 1 CAPSULE BY MOUTH TWICE A WEEK 12 capsule 3   doxycycline  (VIBRA -TABS) 100 MG tablet Take 1 tablet (100 mg total) by mouth 2 (two) times daily. 14 tablet 0   DULoxetine  (CYMBALTA ) 60 MG capsule Take 1 capsule by mouth twice daily 60 capsule 0   furosemide  (LASIX ) 20 MG tablet Take 1 tablet (20 mg total) by mouth daily. 90 tablet 1   metFORMIN   (GLUCOPHAGE ) 1000 MG tablet Take 1,000 mg by mouth 2 (two) times daily.     metFORMIN  (GLUCOPHAGE ) 500 MG tablet Take 2 tablets (1,000 mg total) by mouth 2 (two) times daily with a meal.     Omega-3 Fatty Acids (FISH OIL ) 1000 MG CAPS Take 2 capsules (2,000 mg total) by mouth in the morning and at bedtime. 180 capsule 12   oxyCODONE -acetaminophen  (PERCOCET/ROXICET) 5-325 MG tablet Take 1 tablet by mouth 2 (two) times daily as needed for severe pain (pain score 7-10). 60 tablet 0   sodium chloride  HYPERTONIC 3 % nebulizer solution 3 ml twice daily via nebulizer (Patient taking differently: Take 4 mLs by nebulization 2 (two) times daily.) 180 mL 11   Facility-Administered Medications Prior to Visit  Medication Dose  Route Frequency Provider Last Rate Last Admin   ipratropium-albuterol  (DUONEB) 0.5-2.5 (3) MG/3ML nebulizer solution 3 mL  3 mL Nebulization Once         Allergies  Allergen Reactions   Penicillins Hives, Rash and Dermatitis    Has patient had a PCN reaction causing immediate rash, facial/tongue/throat swelling, SOB or lightheadedness with hypotension:unsure  Has patient had a PCN reaction causing severe rash involving mucus membranes or skin necrosis:unsure  Has patient had a PCN reaction that required hospitalization:No  Has patient had a PCN reaction occurring within the last 10 years:No  If all of the above answers are NO, then may proceed with Cephalosporin use.  rash   Bactrim  [Sulfamethoxazole -Trimethoprim ]     Dizziness, confusion, involuntary movements   Iodinated Contrast Media Rash    Also developed blisters   Penicillin G Rash   Tramadol Rash    Review of Systems  Constitutional:  Positive for fatigue. Negative for chills and fever.  HENT:  Negative for congestion, ear pain and sore throat.   Respiratory:  Negative for cough and shortness of breath.   Cardiovascular:  Negative for chest pain.  Gastrointestinal:  Negative for abdominal pain, constipation,  diarrhea, nausea and vomiting.  Endocrine: Negative for polydipsia, polyphagia and polyuria.  Genitourinary:  Negative for dysuria and frequency.  Musculoskeletal:  Negative for arthralgias and myalgias.  Neurological:  Negative for dizziness and headaches.  Psychiatric/Behavioral:  Negative for dysphoric mood.        No dysphoria       Objective:        07/30/2024    9:51 AM 07/21/2024    9:58 AM 07/01/2024    7:39 AM  Vitals with BMI  Height 5' 10 5' 10 5' 10  Weight 190 lbs  198 lbs  BMI 27.26  28.41  Systolic 118 111 871  Diastolic 58 72 70  Pulse 92 80 57    No data found.   Physical Exam Vitals reviewed.  Constitutional:      Appearance: Normal appearance.  HENT:     Right Ear: Tympanic membrane, ear canal and external ear normal.     Left Ear: There is impacted cerumen (partial).     Nose: Nose normal. No congestion or rhinorrhea.     Mouth/Throat:     Mouth: Mucous membranes are moist.     Pharynx: No oropharyngeal exudate or posterior oropharyngeal erythema.  Neck:     Vascular: No carotid bruit.  Cardiovascular:     Rate and Rhythm: Normal rate and regular rhythm.     Pulses: Normal pulses.     Heart sounds: Normal heart sounds.  Pulmonary:     Effort: Pulmonary effort is normal. No respiratory distress.     Breath sounds: Wheezing (RLL) present. No rhonchi or rales.  Abdominal:     General: Bowel sounds are normal. There is distension.     Palpations: Abdomen is soft.     Tenderness: There is abdominal tenderness (LUQ, LLQ). There is no right CVA tenderness, left CVA tenderness, guarding or rebound.  Neurological:     Mental Status: He is alert.  Psychiatric:        Mood and Affect: Mood normal.        Behavior: Behavior normal.    Diabetic Foot Exam - Simple   Simple Foot Form Diabetic Foot exam was performed with the following findings: Yes 07/30/2024 11:00 AM  Visual Inspection No deformities, no ulcerations, no other skin breakdown  bilaterally: Yes Sensation Testing Intact to touch and monofilament testing bilaterally: Yes Pulse Check See comments: Yes Comments DECREASED PULSES     Health Maintenance Due  Topic Date Due   OPHTHALMOLOGY EXAM  Never done   Hepatitis C Screening  Never done   Zoster Vaccines- Shingrix (1 of 2) Never done   Medicare Annual Wellness (AWV)  01/13/2024   COVID-19 Vaccine (5 - Mixed Product risk 2024-25 season) 07/14/2024    There are no preventive care reminders to display for this patient.   Lab Results  Component Value Date   TSH 1.190 06/03/2024   Lab Results  Component Value Date   WBC 15.5 (H) 07/30/2024   HGB 14.1 07/30/2024   HCT 44.0 07/30/2024   MCV 95 07/30/2024   PLT 206 07/30/2024   Lab Results  Component Value Date   NA 137 07/30/2024   K 4.0 07/30/2024   CO2 27 07/30/2024   GLUCOSE 363 (H) 07/30/2024   BUN 11 07/30/2024   CREATININE 0.97 07/30/2024   BILITOT 0.5 07/30/2024   ALKPHOS 69 07/30/2024   AST 14 07/30/2024   ALT 15 07/30/2024   PROT 5.6 (L) 07/30/2024   ALBUMIN 3.6 (L) 07/30/2024   CALCIUM  8.7 07/30/2024   ANIONGAP 7 11/10/2022   EGFR 82 07/30/2024   GFR 74.13 08/17/2022   Lab Results  Component Value Date   CHOL 120 02/28/2024   Lab Results  Component Value Date   HDL 27 (L) 02/28/2024   Lab Results  Component Value Date   LDLCALC 23 02/28/2024   Lab Results  Component Value Date   TRIG 517 (H) 02/28/2024   Lab Results  Component Value Date   CHOLHDL 4.4 02/28/2024   Lab Results  Component Value Date   HGBA1C 10.1 07/30/2024   Test Result Released: No (scheduled for 07/30/2024 11:17 AM)   0 Result Notes    Component Ref Range & Units (hover) 10:17 (07/30/24)  TC 124  HDL 36  TRG 290  LDL 30  Non-HDL 88        Assessment & Plan:  Diabetic polyneuropathy associated with type 2 diabetes mellitus (HCC) Assessment & Plan: - Not at goal - Discontinue metformin . - Continue toujeo  60 U daily.  - Initiate  Farxiga 10 mg daily. - Implement sliding scale insulin  regimen with Humalog : 6 units before each meal, with additional units based on blood glucose levels. - Prescribe Dexcom G7 continuous glucose monitor. - Provide samples of Farxiga   Orders: -     POCT glycosylated hemoglobin (Hb A1C) -     Microalbumin / creatinine urine ratio -     metFORMIN  HCl; Take 1 tablet (500 mg total) by mouth 2 (two) times daily with a meal.  Dispense: 1 tablet -     Dexcom G7 Sensor; Every 10 days  Dispense: 9 each; Refill: 3  Mixed hyperlipidemia Assessment & Plan: Improved lipid profile with triglycerides reduced from 517 to 290 and LDL at 30. High triglycerides likely related to poor glycemic control. - Continue rosuvastatin  and fenofibrate . - Switch to prescription fish oil  (Vascepa ). - Emphasize importance of glycemic control.  Orders: -     POCT Lipid Panel  Left upper quadrant abdominal pain Assessment & Plan: - Order CT angiogram of abdomen and pelvis to assess for mesenteric ischemia and evaluate for ascites. - Refer to GI.  Orders: -     CBC with Differential/Platelet -     Comprehensive metabolic panel with GFR -  Lactate dehydrogenase -     CT ANGIO ABDOMEN PELVIS  W & WO CONTRAST; Future  PAD (peripheral artery disease) Assessment & Plan: - Monitor for symptoms of claudication and abdominal pain postprandially.   Chronic respiratory failure with hypoxia (HCC) Assessment & Plan: Continue oxygen .    Hypertensive heart disease with chronic systolic congestive heart failure (HCC) Assessment & Plan: Currently on both furosemide  and torsemide , leading to frequent urination without signs of peripheral edema. Previous ED visit suggested CHF exacerbation, but no current swelling observed. - Discontinue furosemide . Continue torsemide  20 mg daily.  Orders: -     NT-proBNP  Atherosclerosis of aorta Assessment & Plan: Check cta abd/pelvis with contrast.  Orders: -     CT ANGIO  ABDOMEN PELVIS  W & WO CONTRAST; Future  Encounter for immunization -     Flu vaccine HIGH DOSE PF(Fluzone Trivalent)  Moderate recurrent major depression (HCC) Assessment & Plan: The current medical regimen is effective;  continue present plan and medications.    Other orders -     Omega-3-acid  Ethyl Esters; Take 2 capsules (2 g total) by mouth 2 (two) times daily.  Dispense: 360 capsule; Refill: 1 -     COVID-19 mRNA Vac-TriS(Pfizer); Inject 0.3 mLs into the muscle once for 1 dose.  Dispense: 0.3 mL; Refill: 0     Body mass index is 27.26 kg/m..   Meds ordered this encounter  Medications   metFORMIN  (GLUCOPHAGE ) 500 MG tablet    Sig: Take 1 tablet (500 mg total) by mouth 2 (two) times daily with a meal.    Dispense:  1 tablet   Continuous Glucose Sensor (DEXCOM G7 SENSOR) MISC    Sig: Every 10 days    Dispense:  9 each    Refill:  3   omega-3 acid ethyl esters (LOVAZA ) 1 g capsule    Sig: Take 2 capsules (2 g total) by mouth 2 (two) times daily.    Dispense:  360 capsule    Refill:  1   DISCONTD: COVID-19 mRNA vaccine, Pfizer, (COMIRNATY) syringe    Sig: Inject 0.3 mLs into the muscle once for 1 dose.    Dispense:  0.3 mL    Refill:  0   COVID-19 mRNA vaccine, Pfizer, (COMIRNATY) syringe    Sig: Inject 0.3 mLs into the muscle once for 1 dose.    Dispense:  0.3 mL    Refill:  0    Orders Placed This Encounter  Procedures   CT ANGIO ABDOMEN PELVIS  W & WO CONTRAST   Flu vaccine HIGH DOSE PF(Fluzone Trivalent)   Microalbumin / creatinine urine ratio   CBC with Differential/Platelet   Comprehensive metabolic panel with GFR   Lactate Dehydrogenase   NT-proBNP   POCT Lipid Panel   POCT glycosylated hemoglobin (Hb A1C)     Follow-up: Return in about 3 months (around 10/29/2024) for chronic fasting.  Total time spent on today's visit was 45 minutes, including both face-to-face time and nonface-to-face time personally spent on review of chart (labs and imaging),  discussing labs and goals, discussing further work-up, treatment options, referrals to specialist if needed, reviewing outside records of pertinent, answering patient's questions, and coordinating care.  An After Visit Summary was printed and given to the patient.  LILLETTE Kato I Leal-Borjas,acting as a scribe for Abigail Free, MD.,have documented all relevant documentation on the behalf of Abigail Free, MD,as directed by  Abigail Free, MD while in the presence of Abigail Free, MD.  I attest that I have reviewed this visit and agree with the plan scribed by my staff.   Abigail Free, MD [facility] [phone]

## 2024-07-30 ENCOUNTER — Ambulatory Visit: Payer: Self-pay | Admitting: Family Medicine

## 2024-07-30 ENCOUNTER — Ambulatory Visit: Admitting: Family Medicine

## 2024-07-30 ENCOUNTER — Other Ambulatory Visit (HOSPITAL_BASED_OUTPATIENT_CLINIC_OR_DEPARTMENT_OTHER): Payer: Self-pay

## 2024-07-30 VITALS — BP 118/58 | HR 92 | Temp 98.2°F | Ht 70.0 in | Wt 190.0 lb

## 2024-07-30 DIAGNOSIS — J9611 Chronic respiratory failure with hypoxia: Secondary | ICD-10-CM

## 2024-07-30 DIAGNOSIS — E1142 Type 2 diabetes mellitus with diabetic polyneuropathy: Secondary | ICD-10-CM | POA: Diagnosis not present

## 2024-07-30 DIAGNOSIS — R5383 Other fatigue: Secondary | ICD-10-CM

## 2024-07-30 DIAGNOSIS — Z23 Encounter for immunization: Secondary | ICD-10-CM | POA: Diagnosis not present

## 2024-07-30 DIAGNOSIS — R1012 Left upper quadrant pain: Secondary | ICD-10-CM

## 2024-07-30 DIAGNOSIS — E1165 Type 2 diabetes mellitus with hyperglycemia: Secondary | ICD-10-CM

## 2024-07-30 DIAGNOSIS — R310 Gross hematuria: Secondary | ICD-10-CM | POA: Diagnosis not present

## 2024-07-30 DIAGNOSIS — F17200 Nicotine dependence, unspecified, uncomplicated: Secondary | ICD-10-CM | POA: Diagnosis not present

## 2024-07-30 DIAGNOSIS — I5022 Chronic systolic (congestive) heart failure: Secondary | ICD-10-CM

## 2024-07-30 DIAGNOSIS — I739 Peripheral vascular disease, unspecified: Secondary | ICD-10-CM | POA: Diagnosis not present

## 2024-07-30 DIAGNOSIS — F331 Major depressive disorder, recurrent, moderate: Secondary | ICD-10-CM

## 2024-07-30 DIAGNOSIS — Z794 Long term (current) use of insulin: Secondary | ICD-10-CM | POA: Diagnosis not present

## 2024-07-30 DIAGNOSIS — I700 Atherosclerosis of aorta: Secondary | ICD-10-CM

## 2024-07-30 DIAGNOSIS — E782 Mixed hyperlipidemia: Secondary | ICD-10-CM | POA: Diagnosis not present

## 2024-07-30 DIAGNOSIS — I110 Hypertensive heart disease with heart failure: Secondary | ICD-10-CM

## 2024-07-30 LAB — POCT LIPID PANEL
HDL: 36
LDL: 30
Non-HDL: 88
TC: 124
TRG: 290

## 2024-07-30 LAB — POCT GLYCOSYLATED HEMOGLOBIN (HGB A1C): HbA1c POC (<> result, manual entry): 10.1 % (ref 4.0–5.6)

## 2024-07-30 MED ORDER — COVID-19 MRNA VAC-TRIS(PFIZER) 30 MCG/0.3ML IM SUSY: 0.3000 mL | PREFILLED_SYRINGE | Freq: Once | INTRAMUSCULAR | 0 refills | Status: DC

## 2024-07-30 MED ORDER — OMEGA-3-ACID ETHYL ESTERS 1 G PO CAPS: 2.0000 g | ORAL_CAPSULE | Freq: Two times a day (BID) | ORAL | 1 refills | Status: DC

## 2024-07-30 MED ORDER — METFORMIN HCL 500 MG PO TABS: 500.0000 mg | ORAL_TABLET | Freq: Two times a day (BID) | ORAL | Status: DC

## 2024-07-30 MED ORDER — DEXCOM G7 SENSOR MISC: 3 refills | Status: DC

## 2024-07-30 MED ORDER — COVID-19 MRNA VAC-TRIS(PFIZER) 30 MCG/0.3ML IM SUSY
0.3000 mL | PREFILLED_SYRINGE | Freq: Once | INTRAMUSCULAR | 0 refills | Status: AC
  Filled 2024-07-30: qty 0.3, 1d supply, fill #0

## 2024-07-30 NOTE — Patient Instructions (Addendum)
  VISIT SUMMARY: You visited us  today to address ongoing abdominal pain, fatigue, and to review your diabetes management. We discussed your current symptoms, reviewed your medications, and made some adjustments to better manage your conditions.  YOUR PLAN: TYPE 2 DIABETES MELLITUS WITH HYPERGLYCEMIA: Your blood sugar levels have been high, and your current medications are not controlling it well enough. -Stop taking metformin . -Start taking Farxiga 10 mg daily. -Continue Toujeo  60 U daily. -Use Humalog  insulin  on a sliding scale:   Check sugars before meals Start on 6 U prior to breakfast, lunch, and supper.  + 1 U if sugar between 151-200. + 2 U if sugar between 201-250 + 3 U if sugar between 251-300 + 4 U if sugar between 301-350 + 5 U if sugar between 351-400 + 6 U if sugar > 400.    -We will provide you with a Dexcom G7 continuous glucose monitor to help track your blood sugar levels. -You will receive samples of Farxiga and Vascepa .  MIXED HYPERLIPIDEMIA: Your cholesterol levels have improved, but your triglycerides are still high, likely due to poor blood sugar control. -Continue taking rosuvastatin  and fenofibrate . -Switch to prescription fish oil  (Vascepa  samples given, but Lovaza  sent as it was preferred by your insurance). -Focus on better controlling your blood sugar levels.  CHRONIC ABDOMINAL PAIN: You have had persistent abdominal pain for the past 4-6 months, which sometimes gets worse after eating. -We will order a CT angiogram of your abdomen and pelvis to check for any issues with blood flow to your intestines and to look for fluid buildup.  PERIPHERAL ARTERIAL DISEASE WITH PRIOR STENTING: You have a history of vascular disease and have had stents placed in your iliac arteries. -Continue monitoring for symptoms like leg pain when walking and abdominal pain after eating.                      Contains text generated by [employer]bridge.                                  Contains text generated by [employer]bridge.

## 2024-07-31 ENCOUNTER — Telehealth: Payer: Self-pay

## 2024-07-31 NOTE — Telephone Encounter (Signed)
 Lenward, pt's wife, called to report pt's PCP has ordered a CTA abd/pel due to 4-6 month abd post prandial pain discovered during office visit on 07/30/24.  Pt knows to move forward with care recommended by PCP and to f/u with [facility] depending on the results and recommendation from PCP.

## 2024-08-01 ENCOUNTER — Other Ambulatory Visit: Payer: Self-pay | Admitting: Family Medicine

## 2024-08-03 ENCOUNTER — Encounter: Payer: Self-pay | Admitting: Family Medicine

## 2024-08-03 DIAGNOSIS — R1012 Left upper quadrant pain: Secondary | ICD-10-CM | POA: Insufficient documentation

## 2024-08-03 LAB — COMPREHENSIVE METABOLIC PANEL WITH GFR
ALT: 15 IU/L (ref 0–44)
AST: 14 IU/L (ref 0–40)
Albumin: 3.6 g/dL — ABNORMAL LOW (ref 3.8–4.8)
Alkaline Phosphatase: 69 IU/L (ref 47–123)
BUN/Creatinine Ratio: 11 (ref 10–24)
BUN: 11 mg/dL (ref 8–27)
Bilirubin Total: 0.5 mg/dL (ref 0.0–1.2)
CO2: 27 mmol/L (ref 20–29)
Calcium: 8.7 mg/dL (ref 8.6–10.2)
Chloride: 95 mmol/L — ABNORMAL LOW (ref 96–106)
Creatinine, Ser: 0.97 mg/dL (ref 0.76–1.27)
Globulin, Total: 2 g/dL (ref 1.5–4.5)
Glucose: 363 mg/dL — ABNORMAL HIGH (ref 70–99)
Potassium: 4 mmol/L (ref 3.5–5.2)
Sodium: 137 mmol/L (ref 134–144)
Total Protein: 5.6 g/dL — ABNORMAL LOW (ref 6.0–8.5)
eGFR: 82 mL/min/1.73 (ref >59)

## 2024-08-03 LAB — MICROALBUMIN / CREATININE URINE RATIO

## 2024-08-03 LAB — CBC WITH DIFFERENTIAL/PLATELET
Basophils Absolute: 0.1 x10E3/uL (ref 0.0–0.2)
Basos: 0 %
EOS (ABSOLUTE): 0.1 x10E3/uL (ref 0.0–0.4)
Eos: 1 %
Hematocrit: 44 % (ref 37.5–51.0)
Hemoglobin: 14.1 g/dL (ref 13.0–17.7)
Immature Grans (Abs): 0.1 x10E3/uL (ref 0.0–0.1)
Immature Granulocytes: 1 %
Lymphocytes Absolute: 1.5 x10E3/uL (ref 0.7–3.1)
Lymphs: 10 %
MCH: 30.4 pg (ref 26.6–33.0)
MCHC: 32 g/dL (ref 31.5–35.7)
MCV: 95 fL (ref 79–97)
Monocytes Absolute: 0.6 x10E3/uL (ref 0.1–0.9)
Monocytes: 4 %
Neutrophils Absolute: 13.1 x10E3/uL — ABNORMAL HIGH (ref 1.4–7.0)
Neutrophils: 84 %
Platelets: 206 x10E3/uL (ref 150–450)
RBC: 4.64 x10E6/uL (ref 4.14–5.80)
RDW: 13.8 % (ref 11.6–15.4)
WBC: 15.5 x10E3/uL — ABNORMAL HIGH (ref 3.4–10.8)

## 2024-08-03 LAB — LACTATE DEHYDROGENASE: LDH: 187 IU/L (ref 121–224)

## 2024-08-03 NOTE — Assessment & Plan Note (Signed)
-   Discontinue metformin . - Initiate Farxiga 10 mg daily. - Implement sliding scale insulin  regimen with Humalog : 6 units before each meal, with additional units based on blood glucose levels. - Prescribe Dexcom G7 continuous glucose monitor. - Provide samples of Farxiga and Vascepa .

## 2024-08-03 NOTE — Assessment & Plan Note (Signed)
 Improved lipid profile with triglycerides reduced from 517 to 290 and LDL at 30. High triglycerides likely related to poor glycemic control. - Continue rosuvastatin  and fenofibrate . - Switch to prescription fish oil  (Vascepa ). - Emphasize importance of glycemic control.

## 2024-08-03 NOTE — Assessment & Plan Note (Signed)
-   Monitor for symptoms of claudication and abdominal pain postprandially.

## 2024-08-03 NOTE — Assessment & Plan Note (Signed)
-   Order CT angiogram of abdomen and pelvis to assess for mesenteric ischemia and evaluate for ascites. - Refer to GI.

## 2024-08-03 NOTE — Assessment & Plan Note (Signed)
-   Not at goal - Discontinue metformin . - Continue toujeo  60 U daily.  - Initiate Farxiga 10 mg daily. - Implement sliding scale insulin  regimen with Humalog : 6 units before each meal, with additional units based on blood glucose levels. - Prescribe Dexcom G7 continuous glucose monitor. - Provide samples of Farxiga

## 2024-08-04 ENCOUNTER — Telehealth: Payer: Self-pay | Admitting: Family Medicine

## 2024-08-04 ENCOUNTER — Other Ambulatory Visit: Payer: Self-pay

## 2024-08-04 MED ORDER — OXYCODONE-ACETAMINOPHEN 5-325 MG PO TABS: 1.0000 | ORAL_TABLET | Freq: Two times a day (BID) | ORAL | 0 refills | Status: DC | PRN

## 2024-08-04 NOTE — Progress Notes (Deleted)
 Cardiology Office Note:    Date:  08/04/2024   ID:  Don [name], DOB 1951-10-13, MRN [id number]  PCP:  Sherre Clapper, MD  Cardiologist:  Redell Leiter, MD    Referring MD: Sherre Clapper, MD    ASSESSMENT:    No diagnosis found. PLAN:    In order of problems listed above:  ***   Next appointment: ***   Medication Adjustments/Labs and Tests Ordered: Current medicines are reviewed at length with the patient today.  Concerns regarding medicines are outlined above.  No orders of the defined types were placed in this encounter.  No orders of the defined types were placed in this encounter.    History of Present Illness:    Don [name] is a 73 y.o. male with a hx of frequent PVCs right bundle branch block and LAHB previous mild left ventricular dysfunction EF 45 to 50% CAD hypertension hyperlipidemia emphysema type 2 diabetes with diabetic polyneuropathy last seen 06/26/2023.  He was unimproved with antiarrhythmic drug therapy amiodarone  and mexiletine and was felt not to be a good candidate for EP ablation with severe COPD.  Amiodarone  was discontinued because of increasing shortness of breath and hypoxia on home monitoring.  last seen 09/14/2023.  He was seen [facility] ED 04/29/2024 referred from his PCP he was seen because he had GI or GU bleeding he had an EKG performed by his PCP because of his abnormal referred to the emergency department there is a note by the emergency room that the EKG is unchanged his previous baseline he had 2 sets of troponin assays which did not show evidence of acute coronary syndrome hemoglobin was 15.3 creatinine 0.9 potassium 4.1 he had hyponatremia with a sodium of 1 BNP level was low at 136 it appears as if he was discharged from the emergency room that EKG reviewed by me showed sinus rhythm right bundle branch and an unusual northwest axis.  There were no acute ischemic changes and a chest CT performed that showed emphysema bilateral  lung infiltrates.  Compliance with diet, lifestyle and medications: *** Past Medical History:  Diagnosis Date   Abnormal chest CT 02/01/2024   Absence of bladder continence 01/08/2022   Acquired hypothyroidism 03/02/2024   Acute infection of nasal sinus 01/13/2023   Acute non-recurrent maxillary sinusitis 01/13/2023   Allergy 1989   Anxiety state 02/02/2022   Arthritis    Asymptomatic LV dysfunction 10/18/2017   Atherosclerosis of aorta 01/13/2023   B12 deficiency anemia 08/25/2021   Basal cell carcinoma    Benign prostatic hyperplasia with incomplete bladder emptying 01/14/2019   Cancer (HCC)    throat - 1997, throat - 2018   Cardiomyopathy, secondary (HCC) 12/01/2016   Overview:  EF 47% 12/26/16   CHF (congestive heart failure) (HCC) 10/2002   Chronic coronary artery disease 10/18/2017   Chronic cough 12/16/2023   Chronic ischemic heart disease 11/13/1998   Jan 22, 2003 Entered By: CHRISTOPHER OLIVE J Comment:  massive Mi in 2000 per patient hsitory, 2nd Mi in 2001Mar 11, 2004 Entered By: CHRISTOPHER OLIVE J Comment: Had stent placedin 2000,cath/angio in 2001   Chronic kidney disease    Chronic neck pain 05/16/2022   Chronic pain syndrome 06/04/2022   Chronic respiratory failure with hypoxia (HCC) 05/16/2022   COPD (chronic obstructive pulmonary disease) (HCC)    COPD exacerbation (HCC) 07/30/2023   Coronary artery disease involving native coronary artery of native heart with angina pectoris 12/01/2016   Overview:  He has hx of IWMI in  remote past, last cath in 2012 at [facility] showed chronic total occlusion of previously stented RCA, with good collaterals, a 40% LAD stenosis, inferior hypokinesis and EF 45%.    He's been lost to Cardiology f/u since 2015, but has not had any recurrent events   Deficiency anemia 08/25/2021   Diabetic polyneuropathy associated with type 2 diabetes mellitus (HCC) 05/16/2022   Dyslipidemia 12/01/2016   Emphysema of lung (HCC)    Encounter for Medicare annual  wellness exam 01/13/2023   Erectile dysfunction due to diseases classified elsewhere 06/12/2019   GAD (generalized anxiety disorder) 02/02/2022   GERD (gastroesophageal reflux disease)    Hallucination 10/08/2022   Hyperlipidemia    Hypertension    Hypertensive heart disease with heart failure (HCC)    Insomnia 01/28/2021   Iron  deficiency 07/07/2023   Iron  deficiency anemia due to chronic blood loss 08/25/2021   Lesion of vocal cord    Leukoplakia of larynx 11/15/2016   Long term (current) use of systemic steroids 12/16/2023   Long-term use of aspirin  therapy 10/02/2018   Lumbar back pain 06/04/2022   Lung nodule 11/30/2023   Lung nodules 08/17/2022   Macular degeneration of both eyes 03/02/2024   Malfunction of penile prosthesis 01/14/2019   Malignant neoplasm of skin 01/28/2021   Formatting of this note might be different from the original. Jan 22, 2003 Entered By: CHRISTOPHER OLIVE J Comment: of skin - removed x2 inpast Jan 22, 2003 Entered By: CHRISTOPHER OLIVE J Comment: of skin - removed x2 inpast   Melanoma of back (HCC)    melanoma on back   Memory loss 03/08/2020   Mixed simple and mucopurulent chronic bronchitis (HCC) 03/02/2024   Moderate recurrent major depression (HCC) 03/02/2023   Mucopurulent chronic bronchitis (HCC) 04/08/2023   Myocardial infarction (HCC) 2000   Oropharyngeal dysphagia 08/12/2018   Other ill-defined and unknown causes of morbidity and mortality 11/13/1993   Formatting of this note might be different from the original. Jan 31, 2008 Entered By: ARMOUR,ROSS B Comment: lumbar, 8/08 Jan 22, 2003 Entered By: CHRISTOPHER OLIVE J Comment: x55yrs in work place  quit 57yrs ago in 2000 Jan 22, 2003 Entered By: CHRISTOPHER OLIVE J Comment: x70yrs in work place  quit 21yrs ago in 2000 Jan 31, 2008 Entered By: MEMORY GULL B Comment: lumbar, 8/08   Oxygen  deficiency    PAD (peripheral artery disease) 10/18/2017   Peripheral vascular disease    iliac artery clot   Peyronie's  disease 06/12/2019   PVC (premature ventricular contraction) 10/09/2023   RBBB 10/09/2023   Sleep apnea    Squamous cell carcinoma of larynx (HCC) 12/13/2016   Steroid myopathy 02/01/2024   Stroke (HCC)    Tia they found on MRI   Throat cancer (HCC)    Tobacco use disorder 06/12/2019   Type 2 diabetes mellitus with hyperglycemia, with long-term current use of insulin  (HCC) 07/30/2023   URI, acute 05/31/2023   Urinary urgency 04/08/2023   Ventricular ectopy 11/09/2022   Vertigo 04/08/2023   Vitamin D  deficiency disease 01/13/2023    Current Medications: No outpatient medications have been marked as taking for the 08/06/24 encounter (Appointment) with Monetta Redell PARAS, MD.   Current Facility-Administered Medications for the 08/06/24 encounter (Appointment) with Monetta Redell PARAS, MD  Medication   ipratropium-albuterol  (DUONEB) 0.5-2.5 (3) MG/3ML nebulizer solution 3 mL      EKGs/Labs/Other Studies Reviewed:    The following studies were reviewed today:  Cardiac Studies & Procedures   ______________________________________________________________________________________________   STRESS TESTS  MYOCARDIAL PERFUSION  IMAGING 05/24/2022  Interpretation Summary   The study is normal. The study is intermediate risk.   LV perfusion is abnormal. There is no evidence of ischemia. There is evidence of infarction. Defect 1: There is a medium defect with moderate reduction in uptake present in the inferior location(s) that is fixed. There is abnormal wall motion in the defect area. Consistent with infarction.   Left ventricular function is abnormal. Global function is mildly reduced. Nuclear stress EF: 49 %. The left ventricular ejection fraction is mildly decreased (45-54%). End diastolic cavity size is normal.   Prior study not available for comparison.   ECHOCARDIOGRAM  ECHOCARDIOGRAM COMPLETE 07/24/2023  Narrative ECHOCARDIOGRAM REPORT    Patient Name:   DICKY BOER Date of  Exam: 07/24/2023 Medical Rec #:  [id number]          Height:       70.0 in Accession #:    [id number]         Weight:       197.0 lb Date of Birth:  August 18, 1951          BSA:          2.074 m Patient Age:    71 years           BP:           124/56 mmHg Patient Gender: M                  HR:           72 bpm. Exam Location:    Procedure: 2D Echo, Cardiac Doppler, Color Doppler and Intracardiac Opacification Agent  Indications:    Frequent PVCs [I49.3 (ICD-10-CM)]; RBBB (right bundle branch block with left anterior fascicular block) [I45.2 (ICD-10-CM)]; Coronary artery disease involving native coronary artery of native heart without angina pectoris [I25.10 (ICD-10-CM)]; Essential hypertension [I10 (ICD-10-CM)]; Mixed hyperlipidemia [E78.2 (ICD-10-CM)]  History:        Patient has prior history of Echocardiogram examinations, most recent 07/25/2022.  Sonographer:    Lynwood Silvas RDCS Referring Phys: [id number] [name]   Sonographer Comments: Technically challenging study due to limited acoustic windows. Global longitudinal strain was attempted. IMPRESSIONS   1. Left ventricular ejection fraction, by estimation, is 50 to 55%. The left ventricle has low normal function. The left ventricle demonstrates global hypokinesis. Left ventricular diastolic parameters are indeterminate. 2. Right ventricular systolic function is normal. The right ventricular size is normal. There is normal pulmonary artery systolic pressure. 3. Trace to small pericardial effusion anteriorlly. There is no evidence of cardiac tamponade. 4. The mitral valve was not well visualized. Trivial mitral valve regurgitation. No evidence of mitral stenosis. 5. The aortic valve is tricuspid. Aortic valve regurgitation is not visualized. No aortic stenosis is present. 6. There is borderline dilatation of the ascending aorta, measuring 36 mm. 7. The inferior vena cava is normal in size with greater than 50% respiratory  variability, suggesting right atrial pressure of 3 mmHg.  Comparison(s): A prior study was performed on 06/03/2021. LVEF was 55-60% and small pericardial effusion and fat pad reported.  FINDINGS Left Ventricle: Left ventricular ejection fraction, by estimation, is 50 to 55%. The left ventricle has low normal function. The left ventricle demonstrates global hypokinesis. Definity  contrast agent was given IV to delineate the left ventricular endocardial borders. The left ventricular internal cavity size was normal in size. There is no left ventricular hypertrophy. Left ventricular diastolic parameters are indeterminate.  Right Ventricle: The right ventricular  size is normal. Right vetricular wall thickness was not well visualized. Right ventricular systolic function is normal. There is normal pulmonary artery systolic pressure. The tricuspid regurgitant velocity is 2.41 m/s, and with an assumed right atrial pressure of 3 mmHg, the estimated right ventricular systolic pressure is 26.2 mmHg.  Left Atrium: Left atrial size was not well visualized.  Right Atrium: Right atrial size was not well visualized.  Pericardium: Trivial pericardial effusion is present. The pericardial effusion is localized near the right atrium and anterior to the right ventricle. There is no evidence of cardiac tamponade. Presence of epicardial fat layer.  Mitral Valve: The mitral valve was not well visualized. Trivial mitral valve regurgitation. No evidence of mitral valve stenosis.  Tricuspid Valve: The tricuspid valve is not well visualized. Tricuspid valve regurgitation is trivial.  Aortic Valve: The aortic valve is tricuspid. Aortic valve regurgitation is not visualized. No aortic stenosis is present.  Pulmonic Valve: The pulmonic valve was not well visualized. Pulmonic valve regurgitation is not visualized.  Aorta: The aortic root is normal in size and structure. There is borderline dilatation of the ascending aorta,  measuring 36 mm.  Venous: The inferior vena cava is normal in size with greater than 50% respiratory variability, suggesting right atrial pressure of 3 mmHg.  IAS/Shunts: The interatrial septum was not well visualized.   LEFT VENTRICLE PLAX 2D LVIDd:         5.70 cm     Diastology LVIDs:         3.80 cm     LV e' medial:    5.55 cm/s LV PW:         1.10 cm     LV E/e' medial:  9.1 LV IVS:        1.10 cm     LV e' lateral:   6.64 cm/s LVOT diam:     2.10 cm     LV E/e' lateral: 7.6 LV SV:         64 LV SV Index:   31 LVOT Area:     3.46 cm  LV Volumes (MOD) LV vol d, MOD A2C: 74.9 ml LV vol d, MOD A4C: 89.2 ml LV vol s, MOD A2C: 40.0 ml LV vol s, MOD A4C: 41.8 ml LV SV MOD A2C:     34.9 ml LV SV MOD A4C:     89.2 ml LV SV MOD BP:      42.8 ml  RIGHT VENTRICLE            IVC RV Basal diam:  2.60 cm    IVC diam: 1.60 cm RV S prime:     9.90 cm/s TAPSE (M-mode): 1.6 cm  LEFT ATRIUM             Index        RIGHT ATRIUM           Index LA diam:        3.70 cm 1.78 cm/m   RA Area:     12.40 cm LA Vol (A2C):   52.8 ml 25.46 ml/m  RA Volume:   30.00 ml  14.47 ml/m LA Vol (A4C):   49.3 ml 23.77 ml/m LA Biplane Vol: 51.4 ml 24.78 ml/m AORTIC VALVE LVOT Vmax:   85.95 cm/s LVOT Vmean:  58.450 cm/s LVOT VTI:    0.184 m  AORTA Ao Root diam: 3.60 cm Ao Asc diam:  3.60 cm Ao Desc diam: 2.90 cm  MV E velocity: 50.30 cm/s  TRICUSPID VALVE MV A velocity: 105.00 cm/s  TR Peak grad:   23.2 mmHg MV E/A ratio:  0.48         TR Vmax:        241.00 cm/s  SHUNTS Systemic VTI:  0.18 m Systemic Diam: 2.10 cm  Sreedhar reddy Madireddy Electronically signed by Alean reddy Madireddy Signature Date/Time: 07/24/2023/5:30:35 PM    Final    MONITORS  LONG TERM MONITOR (3-14 DAYS) 07/18/2023  Narrative Patch Wear Time:  2 days and 10 hours  Patient had a min HR of 63 bpm, max HR of 86 bpm, and avg HR of 72 bpm. Predominant underlying rhythm was Sinus Rhythm. 33.6%  ventricular ectopy Less than 1% supraventricular ectopy Patient triggered episodes associated with sinus rhythm and PVCs  Will Camnitz, MD       ______________________________________________________________________________________________          Recent Labs: 06/03/2024: TSH 1.190 07/30/2024: ALT 15; BUN 11; Creatinine, Ser 0.97; Hemoglobin 14.1; Platelets 206; Potassium 4.0; Sodium 137  Recent Lipid Panel    Component Value Date/Time   CHOL 120 02/28/2024 1000   TRIG 517 (H) 02/28/2024 1000   HDL 27 (L) 02/28/2024 1000   CHOLHDL 4.4 02/28/2024 1000   LDLCALC 23 02/28/2024 1000    Physical Exam:    VS:  There were no vitals taken for this visit.    Wt Readings from Last 3 Encounters:  07/30/24 190 lb (86.2 kg)  07/01/24 198 lb (89.8 kg)  06/03/24 196 lb 6.4 oz (89.1 kg)     GEN: *** Well nourished, well developed in no acute distress HEENT: Normal NECK: No JVD; No carotid bruits LYMPHATICS: No lymphadenopathy CARDIAC: ***RRR, no murmurs, rubs, gallops RESPIRATORY:  Clear to auscultation without rales, wheezing or rhonchi  ABDOMEN: Soft, non-tender, non-distended MUSCULOSKELETAL:  No edema; No deformity  SKIN: Warm and dry NEUROLOGIC:  Alert and oriented x 3 PSYCHIATRIC:  Normal affect    Signed, Redell Leiter, MD  08/04/2024 8:35 PM     [facility]

## 2024-08-04 NOTE — Telephone Encounter (Signed)
 [facility] [phone]

## 2024-08-05 ENCOUNTER — Other Ambulatory Visit: Payer: Self-pay | Admitting: Family Medicine

## 2024-08-05 ENCOUNTER — Telehealth: Payer: Self-pay | Admitting: Family Medicine

## 2024-08-05 ENCOUNTER — Other Ambulatory Visit (HOSPITAL_BASED_OUTPATIENT_CLINIC_OR_DEPARTMENT_OTHER): Payer: Self-pay

## 2024-08-05 DIAGNOSIS — J441 Chronic obstructive pulmonary disease with (acute) exacerbation: Secondary | ICD-10-CM | POA: Diagnosis not present

## 2024-08-05 DIAGNOSIS — I130 Hypertensive heart and chronic kidney disease with heart failure and stage 1 through stage 4 chronic kidney disease, or unspecified chronic kidney disease: Secondary | ICD-10-CM | POA: Diagnosis not present

## 2024-08-05 DIAGNOSIS — J9611 Chronic respiratory failure with hypoxia: Secondary | ICD-10-CM

## 2024-08-05 DIAGNOSIS — G4730 Sleep apnea, unspecified: Secondary | ICD-10-CM | POA: Insufficient documentation

## 2024-08-05 DIAGNOSIS — J439 Emphysema, unspecified: Secondary | ICD-10-CM | POA: Diagnosis not present

## 2024-08-05 DIAGNOSIS — R0902 Hypoxemia: Secondary | ICD-10-CM | POA: Insufficient documentation

## 2024-08-05 DIAGNOSIS — N189 Chronic kidney disease, unspecified: Secondary | ICD-10-CM | POA: Insufficient documentation

## 2024-08-05 DIAGNOSIS — J449 Chronic obstructive pulmonary disease, unspecified: Secondary | ICD-10-CM | POA: Insufficient documentation

## 2024-08-05 DIAGNOSIS — F411 Generalized anxiety disorder: Secondary | ICD-10-CM

## 2024-08-05 DIAGNOSIS — E1142 Type 2 diabetes mellitus with diabetic polyneuropathy: Secondary | ICD-10-CM

## 2024-08-05 DIAGNOSIS — I509 Heart failure, unspecified: Secondary | ICD-10-CM | POA: Diagnosis not present

## 2024-08-05 DIAGNOSIS — E1122 Type 2 diabetes mellitus with diabetic chronic kidney disease: Secondary | ICD-10-CM

## 2024-08-05 DIAGNOSIS — M1990 Unspecified osteoarthritis, unspecified site: Secondary | ICD-10-CM

## 2024-08-05 DIAGNOSIS — T7840XA Allergy, unspecified, initial encounter: Secondary | ICD-10-CM | POA: Insufficient documentation

## 2024-08-05 DIAGNOSIS — F331 Major depressive disorder, recurrent, moderate: Secondary | ICD-10-CM

## 2024-08-05 DIAGNOSIS — I639 Cerebral infarction, unspecified: Secondary | ICD-10-CM | POA: Insufficient documentation

## 2024-08-05 DIAGNOSIS — E039 Hypothyroidism, unspecified: Secondary | ICD-10-CM

## 2024-08-05 MED ORDER — DOXYCYCLINE HYCLATE 100 MG PO TABS: 100.0000 mg | ORAL_TABLET | Freq: Two times a day (BID) | ORAL | 0 refills | Status: DC

## 2024-08-05 MED ORDER — PREDNISONE 20 MG PO TABS: ORAL_TABLET | ORAL | 0 refills | Status: AC

## 2024-08-05 NOTE — Telephone Encounter (Signed)
 [facility] [phone]

## 2024-08-05 NOTE — Assessment & Plan Note (Signed)
Continue oxygen. 

## 2024-08-05 NOTE — Assessment & Plan Note (Signed)
 Check cta abd/pelvis with contrast.

## 2024-08-05 NOTE — Assessment & Plan Note (Signed)
 The current medical regimen is effective;  continue present plan and medications.

## 2024-08-05 NOTE — Assessment & Plan Note (Signed)
 Currently on both furosemide  and torsemide , leading to frequent urination without signs of peripheral edema. Previous ED visit suggested CHF exacerbation, but no current swelling observed. - Discontinue furosemide . - Continue torsemide  20 mg daily.

## 2024-08-06 ENCOUNTER — Ambulatory Visit: Admitting: Cardiology

## 2024-08-06 ENCOUNTER — Encounter: Payer: Self-pay | Admitting: Cardiology

## 2024-08-07 ENCOUNTER — Ambulatory Visit

## 2024-08-08 ENCOUNTER — Other Ambulatory Visit: Payer: Self-pay | Admitting: Physician Assistant

## 2024-08-14 ENCOUNTER — Ambulatory Visit
Admission: RE | Admit: 2024-08-14 | Discharge: 2024-08-14 | Disposition: A | Discharge status: Home / Self Care | Source: Ambulatory Visit | Attending: Otolaryngology | Admitting: Otolaryngology

## 2024-08-14 ENCOUNTER — Encounter: Payer: Self-pay | Admitting: Family Medicine

## 2024-08-14 ENCOUNTER — Other Ambulatory Visit: Payer: Self-pay | Admitting: Otolaryngology

## 2024-08-14 DIAGNOSIS — R1314 Dysphagia, pharyngoesophageal phase: Secondary | ICD-10-CM

## 2024-08-14 DIAGNOSIS — R1310 Dysphagia, unspecified: Secondary | ICD-10-CM | POA: Diagnosis not present

## 2024-08-15 ENCOUNTER — Telehealth: Payer: Self-pay | Admitting: Family Medicine

## 2024-08-15 NOTE — Telephone Encounter (Signed)
 [facility] [phone]

## 2024-08-15 NOTE — Telephone Encounter (Signed)
 [facility] [phone]

## 2024-08-16 DIAGNOSIS — J9610 Chronic respiratory failure, unspecified whether with hypoxia or hypercapnia: Secondary | ICD-10-CM | POA: Diagnosis not present

## 2024-08-19 NOTE — Telephone Encounter (Unsigned)
 Copied from CRM [id number]. Topic: Referral - Status >> Aug 18, 2024  1:23 PM Antony RAMAN wrote: Reason for CRM: Apolinar from [facility] care called asking if doc can sign and send back the paperwork she faxed to us  on 08/06/24 . Her callback- [phone]

## 2024-08-20 ENCOUNTER — Other Ambulatory Visit: Payer: Self-pay | Admitting: Cardiology

## 2024-08-20 NOTE — Progress Notes (Deleted)
 Cardiology Office Note:  .   Date:  08/20/2024  ID:  Don [name], DOB Sep 22, 1951, MRN [id number] PCP: Don Clapper, MD  [facility] Providers Cardiologist:  None    History of Present Illness: .   Don [name] is a 73 y.o. male with a past medical history of cardiomyopathy, mild bilateral carotid artery stenosis, CAD, PAD on Plavix  follows with vascular, stage IV COPD, hypertension, PVD, GERD, DM 2, BPH, CKD stage III, hyperlipidemia, history of tobacco use, OSA not compliant with CPAP.  06/13/2024 carotid duplex mild bilateral carotid artery stenosis 05/26/2024 ABIs moderate right lower extremity disease in the right, mild left lower extremity arterial disease 07/24/2023 echo EF 50 to 55%, trace pericardial effusion without tamponade, trivial MR 06/11/2023 monitor predominant rhythm was sinus, VE burden 33.6% 11/10/2022 echocardiogram EF 45 to 50%, no RWMA, trivial MR 05/24/2022 Lexiscan  no evidence of ischemia, intermediate risk, medium defect that is fixed consistent with prior infarction 02/20/2020 left heart cath no changes since prior cath in 2012, known CTO of the RCA with previously deployed stent, mild nonobstructive disease elsewhere 2001 left heart cath patient reports stent placement 2001 10/31/1999 and left heart cath mild nonobstructive CAD, no interventions  He [facility] with [facility] for management of his coronary artery disease.  In 2000 he underwent left heart cath revealing mild nonobstructive CAD, reports in 2001 he underwent a repeat catheterization with a stent placed.  In 2021 he underwent a left heart cath and per the report it says no significant changes since prior cath in 2012--not clear with his cath was performed at, with known CTO of the RCA with previously deployed stent and mild nonobstructive disease elsewhere.  In 2023 he underwent a Lexiscan  which was intermediate risk secondary to fixed defect.  In 2024 he wore a monitor  for palpitations revealing a VE burden of 33.6% and was evaluated by EP and been started on mexiletine as well as amiodarone  discharge was unable to tolerate either and was not felt to be a good candidate for ablation, discussion was had regarding admission for starting sotalol however, he ultimately declined.  Most recently evaluated by Dr. Monetta on 09/14/2023, he remains symptomatic regarding his PVCs and he was referred to Dr. Leonce per the request of the family at [facility] EP and was evaluated by them in November 2024 as well, was felt a shortness of breath was likely multifactorial, consideration of catheter ablation if he was able to be smoke-free and COPD flare free for at least a month--high risk for complete heart block given his RBBB and LAFB--ultimately decision was made to follow back up in 3 months, but could consider restarting low-dose amiodarone  without a load or ranolazine.   Not a candidate for sotalol QTc 519, amiodarone  worsening shortness of breath, mexiletine caused hallucinations  ROS: Review of Systems  Constitutional: Negative.   HENT: Negative.    Eyes: Negative.   Respiratory: Negative.    Cardiovascular: Negative.   Gastrointestinal: Negative.   Genitourinary: Negative.   Musculoskeletal: Negative.   Skin: Negative.   Neurological: Negative.   Endo/Heme/Allergies: Negative.   Psychiatric/Behavioral: Negative.       Studies Reviewed: .        Cardiac Studies & Procedures   ______________________________________________________________________________________________   STRESS TESTS  MYOCARDIAL PERFUSION IMAGING 05/24/2022  Interpretation Summary   The study is normal. The study is intermediate risk.   LV perfusion is abnormal. There is no evidence of ischemia. There is evidence  of infarction. Defect 1: There is a medium defect with moderate reduction in uptake present in the inferior location(s) that is fixed. There is abnormal wall motion in the defect area.  Consistent with infarction.   Left ventricular function is abnormal. Global function is mildly reduced. Nuclear stress EF: 49 %. The left ventricular ejection fraction is mildly decreased (45-54%). End diastolic cavity size is normal.   Prior study not available for comparison.   ECHOCARDIOGRAM  ECHOCARDIOGRAM COMPLETE 07/24/2023  Narrative ECHOCARDIOGRAM REPORT    Patient Name:   Don [name] Date of Exam: 07/24/2023 Medical Rec #:  [id number]          Height:       70.0 in Accession #:    [id number]         Weight:       197.0 lb Date of Birth:  1951-04-02          BSA:          2.074 m Patient Age:    71 years           BP:           124/56 mmHg Patient Gender: M                  HR:           72 bpm. Exam Location:  [place]  Procedure: 2D Echo, Cardiac Doppler, Color Doppler and Intracardiac Opacification Agent  Indications:    Frequent PVCs [I49.3 (ICD-10-CM)]; RBBB (right bundle branch block with left anterior fascicular block) [I45.2 (ICD-10-CM)]; Coronary artery disease involving native coronary artery of native heart without angina pectoris [I25.10 (ICD-10-CM)]; Essential hypertension [I10 (ICD-10-CM)]; Mixed hyperlipidemia [E78.2 (ICD-10-CM)]  History:        Patient has prior history of Echocardiogram examinations, most recent 07/25/2022.  Sonographer:    Don [name] RDCS Referring Phys: [id number] Don [name]   Sonographer Comments: Technically challenging study due to limited acoustic windows. Global longitudinal strain was attempted. IMPRESSIONS   1. Left ventricular ejection fraction, by estimation, is 50 to 55%. The left ventricle has low normal function. The left ventricle demonstrates global hypokinesis. Left ventricular diastolic parameters are indeterminate. 2. Right ventricular systolic function is normal. The right ventricular size is normal. There is normal pulmonary artery systolic pressure. 3. Trace to small pericardial effusion anteriorlly. There  is no evidence of cardiac tamponade. 4. The mitral valve was not well visualized. Trivial mitral valve regurgitation. No evidence of mitral stenosis. 5. The aortic valve is tricuspid. Aortic valve regurgitation is not visualized. No aortic stenosis is present. 6. There is borderline dilatation of the ascending aorta, measuring 36 mm. 7. The inferior vena cava is normal in size with greater than 50% respiratory variability, suggesting right atrial pressure of 3 mmHg.  Comparison(s): A prior study was performed on 06/03/2021. LVEF was 55-60% and small pericardial effusion and fat pad reported.  FINDINGS Left Ventricle: Left ventricular ejection fraction, by estimation, is 50 to 55%. The left ventricle has low normal function. The left ventricle demonstrates global hypokinesis. Definity  contrast agent was given IV to delineate the left ventricular endocardial borders. The left ventricular internal cavity size was normal in size. There is no left ventricular hypertrophy. Left ventricular diastolic parameters are indeterminate.  Right Ventricle: The right ventricular size is normal. Right vetricular wall thickness was not well visualized. Right ventricular systolic function is normal. There is normal pulmonary artery systolic pressure. The tricuspid regurgitant velocity is 2.41 m/s,  and with an assumed right atrial pressure of 3 mmHg, the estimated right ventricular systolic pressure is 26.2 mmHg.  Left Atrium: Left atrial size was not well visualized.  Right Atrium: Right atrial size was not well visualized.  Pericardium: Trivial pericardial effusion is present. The pericardial effusion is localized near the right atrium and anterior to the right ventricle. There is no evidence of cardiac tamponade. Presence of epicardial fat layer.  Mitral Valve: The mitral valve was not well visualized. Trivial mitral valve regurgitation. No evidence of mitral valve stenosis.  Tricuspid Valve: The tricuspid valve  is not well visualized. Tricuspid valve regurgitation is trivial.  Aortic Valve: The aortic valve is tricuspid. Aortic valve regurgitation is not visualized. No aortic stenosis is present.  Pulmonic Valve: The pulmonic valve was not well visualized. Pulmonic valve regurgitation is not visualized.  Aorta: The aortic root is normal in size and structure. There is borderline dilatation of the ascending aorta, measuring 36 mm.  Venous: The inferior vena cava is normal in size with greater than 50% respiratory variability, suggesting right atrial pressure of 3 mmHg.  IAS/Shunts: The interatrial septum was not well visualized.   LEFT VENTRICLE PLAX 2D LVIDd:         5.70 cm     Diastology LVIDs:         3.80 cm     LV e' medial:    5.55 cm/s LV PW:         1.10 cm     LV E/e' medial:  9.1 LV IVS:        1.10 cm     LV e' lateral:   6.64 cm/s LVOT diam:     2.10 cm     LV E/e' lateral: 7.6 LV SV:         64 LV SV Index:   31 LVOT Area:     3.46 cm  LV Volumes (MOD) LV vol d, MOD A2C: 74.9 ml LV vol d, MOD A4C: 89.2 ml LV vol s, MOD A2C: 40.0 ml LV vol s, MOD A4C: 41.8 ml LV SV MOD A2C:     34.9 ml LV SV MOD A4C:     89.2 ml LV SV MOD BP:      42.8 ml  RIGHT VENTRICLE            IVC RV Basal diam:  2.60 cm    IVC diam: 1.60 cm RV S prime:     9.90 cm/s TAPSE (M-mode): 1.6 cm  LEFT ATRIUM             Index        RIGHT ATRIUM           Index LA diam:        3.70 cm 1.78 cm/m   RA Area:     12.40 cm LA Vol (A2C):   52.8 ml 25.46 ml/m  RA Volume:   30.00 ml  14.47 ml/m LA Vol (A4C):   49.3 ml 23.77 ml/m LA Biplane Vol: 51.4 ml 24.78 ml/m AORTIC VALVE LVOT Vmax:   85.95 cm/s LVOT Vmean:  58.450 cm/s LVOT VTI:    0.184 m  AORTA Ao Root diam: 3.60 cm Ao Asc diam:  3.60 cm Ao Desc diam: 2.90 cm  MV E velocity: 50.30 cm/s   TRICUSPID VALVE MV A velocity: 105.00 cm/s  TR Peak grad:   23.2 mmHg MV E/A ratio:  0.48         TR  Vmax:        241.00 cm/s  SHUNTS Systemic  VTI:  0.18 m Systemic Diam: 2.10 cm  Sreedhar reddy Madireddy Electronically signed by Alean reddy Madireddy Signature Date/Time: 07/24/2023/5:30:35 PM    Final    MONITORS  LONG TERM MONITOR (3-14 DAYS) 07/18/2023  Narrative Patch Wear Time:  2 days and 10 hours  Patient had a min HR of 63 bpm, max HR of 86 bpm, and avg HR of 72 bpm. Predominant underlying rhythm was Sinus Rhythm. 33.6% ventricular ectopy Less than 1% supraventricular ectopy Patient triggered episodes associated with sinus rhythm and PVCs  Will Camnitz, MD       ______________________________________________________________________________________________      Risk Assessment/Calculations:     No BP recorded.  {Refresh Note OR Click here to enter BP  :1}***       Physical Exam:   VS:  There were no vitals taken for this visit.   Wt Readings from Last 3 Encounters:  07/30/24 190 lb (86.2 kg)  07/01/24 198 lb (89.8 kg)  06/03/24 196 lb 6.4 oz (89.1 kg)    GEN: Well nourished, well developed in no acute distress NECK: No JVD; No carotid bruits CARDIAC: RRR, no murmurs, rubs, gallops RESPIRATORY:  diminished but clear ABDOMEN: Soft, non-tender, non-distended EXTREMITIES:  No edema; No deformity   ASSESSMENT AND PLAN: .   CAD - previous infarction chronic occlusion right coronary artery, Stable with no anginal symptoms. No indication for ischemic evaluation.  Continue Plavix  75 mg daily, continue Crestor  40 mg daily, continue nitroglycerin  as needed, continue fenofibrate  160 mg daily. Frequent PVCs-previous EKG appears to reveal trigeminy, today periods of bigeminy.  Previously evaluated by EP was started on mexiletine however they stopped this approximately 3 months ago due to the patient developing hallucinations.  Stated they were waiting to hear back about an alternative.  I will reach out to Dr. Inocencio to see if he has any further suggestions.  They were previously sent a monitor to wear after  initiation of mexiletine however they did not use this as they did not continue the mexiletine and just recently sent the monitor back.  HFmrEF - NYHA class II, euvolemic, continue losartan  50 mg daily, continue Lasix  20 mg daily. HLD -most recent LDL was 40 however triglycerides were 267, this appears to be managed by PCP.  Continue Crestor  40 mg daily, continue fenofibrate  160 mg daily.       Dispo: CBC, BMET, Mag, TSH. Will see if he can get in with EP and reach out to Dr. Inocencio as well. Keep f/u with Dr. Monetta.   Signed, Delon JAYSON Hoover, NP

## 2024-08-21 ENCOUNTER — Other Ambulatory Visit: Payer: Self-pay | Admitting: Family Medicine

## 2024-08-21 ENCOUNTER — Telehealth: Payer: Self-pay

## 2024-08-21 ENCOUNTER — Telehealth: Payer: Self-pay | Admitting: Family Medicine

## 2024-08-21 ENCOUNTER — Other Ambulatory Visit: Payer: Self-pay | Admitting: Physician Assistant

## 2024-08-21 ENCOUNTER — Encounter: Payer: Self-pay | Admitting: Family Medicine

## 2024-08-21 MED ORDER — NIRMATRELVIR/RITONAVIR (PAXLOVID)TABLET: 3.0000 | ORAL_TABLET | Freq: Two times a day (BID) | ORAL | 0 refills | Status: DC

## 2024-08-21 NOTE — Telephone Encounter (Signed)
 Copied from CRM [id number]. Topic: Clinical - Medication Question >> Aug 21, 2024  5:21 PM Shanda MATSU wrote: Reason for CRM: LENWARD SPARKS, patient's wife, called in req that someone contact [facility] in regards to med that was ordered for patient, nirmatrelvir /ritonavir  (PAXLOVID ) 20 x 150 MG & 10 x 100MG  TABS. Caller stated [facility] is needing to confirm that nirmatrelvir /ritonavir  (PAXLOVID ) 20 x 150 MG & 10 x 100MG  TABS can be prescribed to patient even though patient is taking oxyCODONE -acetaminophen  (PERCOCET/ROXICET) 5-325 MG tablet.

## 2024-08-21 NOTE — Telephone Encounter (Signed)
 Patient's wife called concerned that patient started with fever last night and she tested him for covid and it came back positive. She asked if provider send medication for covid because he has many COPD,  CHF, Sleep apnea.   Talked with Nola Angles, PA-C and he approved to send Paxlovid . Prescription was sent to [facility]. Hold Rosuvastatin  in the meantime and drink a lot of fluids. Patient's wife was notified.

## 2024-08-22 ENCOUNTER — Ambulatory Visit: Admitting: Cardiology

## 2024-08-22 ENCOUNTER — Telehealth: Payer: Self-pay

## 2024-08-22 DIAGNOSIS — I739 Peripheral vascular disease, unspecified: Secondary | ICD-10-CM

## 2024-08-22 DIAGNOSIS — I2510 Atherosclerotic heart disease of native coronary artery without angina pectoris: Secondary | ICD-10-CM

## 2024-08-22 DIAGNOSIS — I452 Bifascicular block: Secondary | ICD-10-CM

## 2024-08-22 DIAGNOSIS — I493 Ventricular premature depolarization: Secondary | ICD-10-CM

## 2024-08-22 NOTE — Telephone Encounter (Signed)
 I have sent provider a message waiting on reply

## 2024-08-22 NOTE — Telephone Encounter (Signed)
 Called patient wife and made him aware that office is waiting on pharmacy to open to call back.   Copied from CRM [id number]. Topic: Clinical - Prescription Issue >> Aug 21, 2024  2:26 PM Amy B wrote: Reason for CRM: Pharmacy states they received a prescription for nirmatrelvir /ritonavir  (PAXLOVID ) 20 x 150 MG & 10 x 100MG  TABS; however, they state this will increase the patient's oxycodone  effect.  They request a call back to discuss whether or not to fill this Rx. Janelle at [phone]

## 2024-08-28 ENCOUNTER — Ambulatory Visit (HOSPITAL_BASED_OUTPATIENT_CLINIC_OR_DEPARTMENT_OTHER)
Admission: RE | Admit: 2024-08-28 | Discharge: 2024-08-28 | Disposition: A | Discharge status: Home / Self Care | Source: Ambulatory Visit | Attending: Family Medicine | Admitting: Family Medicine

## 2024-08-28 ENCOUNTER — Other Ambulatory Visit: Payer: Self-pay | Admitting: Family Medicine

## 2024-08-28 ENCOUNTER — Other Ambulatory Visit: Payer: Self-pay | Admitting: Cardiology

## 2024-08-28 DIAGNOSIS — I700 Atherosclerosis of aorta: Secondary | ICD-10-CM

## 2024-08-28 DIAGNOSIS — I708 Atherosclerosis of other arteries: Secondary | ICD-10-CM | POA: Diagnosis not present

## 2024-08-28 DIAGNOSIS — N280 Ischemia and infarction of kidney: Secondary | ICD-10-CM | POA: Diagnosis not present

## 2024-08-28 DIAGNOSIS — R1012 Left upper quadrant pain: Secondary | ICD-10-CM

## 2024-08-28 MED ORDER — IOHEXOL 350 MG/ML SOLN
100.0000 mL | Freq: Once | INTRAVENOUS | Status: AC | PRN
Start: 2024-08-28 — End: 2024-08-28
  Administered 2024-08-28: 100 mL via INTRAVENOUS

## 2024-08-29 ENCOUNTER — Other Ambulatory Visit: Payer: Self-pay | Admitting: Family Medicine

## 2024-08-30 ENCOUNTER — Other Ambulatory Visit: Payer: Self-pay | Admitting: Cardiology

## 2024-09-02 ENCOUNTER — Ambulatory Visit

## 2024-09-03 NOTE — Progress Notes (Deleted)
 Cardiology Office Note:    Date:  09/04/2024   ID:  Don [name], DOB [birth year], MRN [id number]  PCP:  Sherre Clapper, MD  Cardiologist:  Redell Leiter, MD    Referring MD: Sherre Clapper, MD    ASSESSMENT:    1. Frequent PVCs   2. RBBB (right bundle branch block with left anterior fascicular block)   3. Coronary artery disease involving native coronary artery of native heart without angina pectoris   4. LV dysfunction   5. Hypertensive heart disease, unspecified whether heart failure present   6. Mixed hyperlipidemia    PLAN:    In order of problems listed above:  ***   Next appointment: ***   Medication Adjustments/Labs and Tests Ordered: Current medicines are reviewed at length with the patient today.  Concerns regarding medicines are outlined above.  No orders of the defined types were placed in this encounter.  No orders of the defined types were placed in this encounter.    History of Present Illness:    Don [name] is a 73 y.o. male with a hx of frequent PVCs right bundle branch block and LAHB previous mild left ventricular dysfunction EF 45 to 50% CAD with subsequent ejection fraction September 2000 2450 to 55% with remote PCI of the right coronary artery hypertension hyperlipidemia emphysema type 2 diabetes with diabetic polyneuropathy last seen 06/26/2023.  He was unimproved with antiarrhythmic drug therapy amiodarone  and mexiletine and was felt not to be a good candidate for EP ablation with severe COPD.  Amiodarone  was discontinued because of increasing shortness of breath and hypoxia on home monitoring.  Did not feel to be a candidate for sotalol because of the severity of COPD.  He he was last seen 09/14/2023.  He was seen [facility] ED 04/29/2024 referred from his PCP he was seen because he had GI or GU bleeding he had an EKG performed by his PCP because of his abnormal referred to the emergency department there is a note by the emergency room  that the EKG is unchanged his previous baseline he had 2 sets of troponin assays which did not show evidence of acute coronary syndrome hemoglobin was 15.3 creatinine 0.9 potassium 4.1 he had hyponatremia BNP level was low at 136 it appears as if he was discharged from the emergency room that EKG reviewed by me showed sinus rhythm right bundle branch and an unusual northwest axis.  There were no acute ischemic changes and a chest CT performed that showed emphysema bilateral lung infiltrates.  Compliance with diet, lifestyle and medications: *** Past Medical History:  Diagnosis Date   Abdominal pain 06/03/2024   Abnormal chest CT 02/01/2024   Absence of bladder continence 01/08/2022   Acquired hypothyroidism 03/02/2024   Acute infection of nasal sinus 01/13/2023   Acute non-recurrent maxillary sinusitis 01/13/2023   Allergy 1989   Anxiety state 02/02/2022   Arthritis    Asymptomatic LV dysfunction 10/18/2017   Atherosclerosis of aorta 01/13/2023   B12 deficiency anemia 08/25/2021   Balance problem 07/01/2024   Basal cell carcinoma    Benign prostatic hyperplasia with incomplete bladder emptying 01/14/2019   Blood in ear canal, right 06/03/2024   Cancer (HCC)    throat - 1997, throat - 2018   Cardiomyopathy, secondary (HCC) 12/01/2016   Overview:  EF 47% 12/26/16   CHF (congestive heart failure) (HCC) 10/2002   Chronic coronary artery disease 10/18/2017   Chronic cough 12/16/2023   Chronic ischemic heart disease 11/13/1998  Jan 22, 2003 Entered By: CHRISTOPHER JOELYN PARAS Comment:  massive Mi in 2000 per patient hsitory, 2nd Mi in 2001Mar 11, 2004 Entered By: CHRISTOPHER JOELYN J Comment: Had stent placedin 2000,cath/angio in 2001   Chronic kidney disease    Chronic neck pain 05/16/2022   Chronic pain syndrome 06/04/2022   Chronic respiratory failure with hypoxia (HCC) 05/16/2022   COPD (chronic obstructive pulmonary disease) (HCC)    COPD exacerbation (HCC) 07/30/2023   Coronary artery disease  involving native coronary artery of native heart with angina pectoris 12/01/2016   Overview:  He has hx of IWMI in remote past, last cath in 2012 at [facility] showed chronic total occlusion of previously stented RCA, with good collaterals, a 40% LAD stenosis, inferior hypokinesis and EF 45%.    He's been lost to Cardiology f/u since 2015, but has not had any recurrent events   Deficiency anemia 08/25/2021   Diabetic polyneuropathy associated with type 2 diabetes mellitus (HCC) 05/16/2022   Dyslipidemia 12/01/2016   Dysuria 04/29/2024   Emphysema of lung (HCC)    Encounter for Medicare annual wellness exam 01/13/2023   Erectile dysfunction due to diseases classified elsewhere 06/12/2019   GAD (generalized anxiety disorder) 02/02/2022   GERD (gastroesophageal reflux disease)    Hallucination 10/08/2022   History of recent fall 06/03/2024   Hyperlipidemia    Hypertension    Hypertensive heart disease with heart failure (HCC)    Insomnia 01/28/2021   Intercostal pain 04/29/2024   Iron  deficiency 07/07/2023   Iron  deficiency anemia due to chronic blood loss 08/25/2021   Lesion of vocal cord    Leukoplakia of larynx 11/15/2016   Long term (current) use of systemic steroids 12/16/2023   Long-term use of aspirin  therapy 10/02/2018   Lumbar back pain 06/04/2022   Lung nodule 11/30/2023   Lung nodules 08/17/2022   Macular degeneration of both eyes 03/02/2024   Malfunction of penile prosthesis 01/14/2019   Malignant neoplasm of skin 01/28/2021   Formatting of this note might be different from the original. Jan 22, 2003 Entered By: CHRISTOPHER JOELYN J Comment: of skin - removed x2 inpast Jan 22, 2003 Entered By: CHRISTOPHER JOELYN J Comment: of skin - removed x2 inpast   Melanoma of back (HCC)    melanoma on back   Memory loss 03/08/2020   Mixed simple and mucopurulent chronic bronchitis (HCC) 03/02/2024   Moderate recurrent major depression (HCC) 03/02/2023   Mucopurulent chronic bronchitis (HCC) 04/08/2023    Myocardial infarction (HCC) 2000   Oropharyngeal dysphagia 08/12/2018   Other ill-defined and unknown causes of morbidity and mortality 11/13/1993   Formatting of this note might be different from the original. Jan 31, 2008 Entered By: ARMOUR,ROSS B Comment: lumbar, 8/08 Jan 22, 2003 Entered By: CHRISTOPHER JOELYN J Comment: x59yrs in work place  quit 71yrs ago in 2000 Jan 22, 2003 Entered By: CHRISTOPHER JOELYN J Comment: x77yrs in work place  quit 98yrs ago in 2000 Jan 31, 2008 Entered By: MEMORY GULL B Comment: lumbar, 8/08   Other microscopic hematuria 04/29/2024   Oxygen  deficiency    PAD (peripheral artery disease) 10/18/2017   Palpitations 04/29/2024   Peripheral vascular disease    iliac artery clot   Peyronie's disease 06/12/2019   PVC (premature ventricular contraction) 10/09/2023   RBBB 10/09/2023   Sleep apnea    Steroid myopathy 02/01/2024   Stroke (HCC)    Tia they found on MRI   Tobacco use disorder 06/12/2019   Type 2 diabetes mellitus with hyperglycemia, with long-term current use of  insulin  (HCC) 07/30/2023   URI, acute 05/31/2023   Urinary urgency 04/08/2023   Ventricular ectopy 11/09/2022   Vertigo 04/08/2023   Vitamin D  deficiency disease 01/13/2023    Current Medications: No outpatient medications have been marked as taking for the 09/04/24 encounter (Appointment) with Monetta Redell PARAS, MD.   Current Facility-Administered Medications for the 09/04/24 encounter (Appointment) with Monetta Redell PARAS, MD  Medication   ipratropium-albuterol  (DUONEB) 0.5-2.5 (3) MG/3ML nebulizer solution 3 mL      EKGs/Labs/Other Studies Reviewed:    The following studies were reviewed today:  Cardiac Studies & Procedures   ______________________________________________________________________________________________   STRESS TESTS  MYOCARDIAL PERFUSION IMAGING 05/24/2022  Interpretation Summary   The study is normal. The study is intermediate risk.   LV perfusion is abnormal.  There is no evidence of ischemia. There is evidence of infarction. Defect 1: There is a medium defect with moderate reduction in uptake present in the inferior location(s) that is fixed. There is abnormal wall motion in the defect area. Consistent with infarction.   Left ventricular function is abnormal. Global function is mildly reduced. Nuclear stress EF: 49 %. The left ventricular ejection fraction is mildly decreased (45-54%). End diastolic cavity size is normal.   Prior study not available for comparison.   ECHOCARDIOGRAM  ECHOCARDIOGRAM COMPLETE 07/24/2023  Narrative ECHOCARDIOGRAM REPORT    Patient Name:   Don [name] Date of Exam: 07/24/2023 Medical Rec #:  [id number]          Height:       70.0 in Accession #:    [id number]         Weight:       197.0 lb Date of Birth:  [birth year]          BSA:          2.074 m Patient Age:    71 years           BP:           124/56 mmHg Patient Gender: M                  HR:           72 bpm. Exam Location:  [facility]  Procedure: 2D Echo, Cardiac Doppler, Color Doppler and Intracardiac Opacification Agent  Indications:    Frequent PVCs [I49.3 (ICD-10-CM)]; RBBB (right bundle branch block with left anterior fascicular block) [I45.2 (ICD-10-CM)]; Coronary artery disease involving native coronary artery of native heart without angina pectoris [I25.10 (ICD-10-CM)]; Essential hypertension [I10 (ICD-10-CM)]; Mixed hyperlipidemia [E78.2 (ICD-10-CM)]  History:        Patient has prior history of Echocardiogram examinations, most recent 07/25/2022.  Sonographer:    Lynwood Silvas RDCS Referring Phys: [id number] REDELL PARAS [place]   Sonographer Comments: Technically challenging study due to limited acoustic windows. Global longitudinal strain was attempted. IMPRESSIONS   1. Left ventricular ejection fraction, by estimation, is 50 to 55%. The left ventricle has low normal function. The left ventricle demonstrates global hypokinesis. Left ventricular  diastolic parameters are indeterminate. 2. Right ventricular systolic function is normal. The right ventricular size is normal. There is normal pulmonary artery systolic pressure. 3. Trace to small pericardial effusion anteriorlly. There is no evidence of cardiac tamponade. 4. The mitral valve was not well visualized. Trivial mitral valve regurgitation. No evidence of mitral stenosis. 5. The aortic valve is tricuspid. Aortic valve regurgitation is not visualized. No aortic stenosis is present. 6. There is borderline dilatation of the ascending aorta, measuring  36 mm. 7. The inferior vena cava is normal in size with greater than 50% respiratory variability, suggesting right atrial pressure of 3 mmHg.  Comparison(s): A prior study was performed on 06/03/2021. LVEF was 55-60% and small pericardial effusion and fat pad reported.  FINDINGS Left Ventricle: Left ventricular ejection fraction, by estimation, is 50 to 55%. The left ventricle has low normal function. The left ventricle demonstrates global hypokinesis. Definity  contrast agent was given IV to delineate the left ventricular endocardial borders. The left ventricular internal cavity size was normal in size. There is no left ventricular hypertrophy. Left ventricular diastolic parameters are indeterminate.  Right Ventricle: The right ventricular size is normal. Right vetricular wall thickness was not well visualized. Right ventricular systolic function is normal. There is normal pulmonary artery systolic pressure. The tricuspid regurgitant velocity is 2.41 m/s, and with an assumed right atrial pressure of 3 mmHg, the estimated right ventricular systolic pressure is 26.2 mmHg.  Left Atrium: Left atrial size was not well visualized.  Right Atrium: Right atrial size was not well visualized.  Pericardium: Trivial pericardial effusion is present. The pericardial effusion is localized near the right atrium and anterior to the right ventricle. There is  no evidence of cardiac tamponade. Presence of epicardial fat layer.  Mitral Valve: The mitral valve was not well visualized. Trivial mitral valve regurgitation. No evidence of mitral valve stenosis.  Tricuspid Valve: The tricuspid valve is not well visualized. Tricuspid valve regurgitation is trivial.  Aortic Valve: The aortic valve is tricuspid. Aortic valve regurgitation is not visualized. No aortic stenosis is present.  Pulmonic Valve: The pulmonic valve was not well visualized. Pulmonic valve regurgitation is not visualized.  Aorta: The aortic root is normal in size and structure. There is borderline dilatation of the ascending aorta, measuring 36 mm.  Venous: The inferior vena cava is normal in size with greater than 50% respiratory variability, suggesting right atrial pressure of 3 mmHg.  IAS/Shunts: The interatrial septum was not well visualized.   LEFT VENTRICLE PLAX 2D LVIDd:         5.70 cm     Diastology LVIDs:         3.80 cm     LV e' medial:    5.55 cm/s LV PW:         1.10 cm     LV E/e' medial:  9.1 LV IVS:        1.10 cm     LV e' lateral:   6.64 cm/s LVOT diam:     2.10 cm     LV E/e' lateral: 7.6 LV SV:         64 LV SV Index:   31 LVOT Area:     3.46 cm  LV Volumes (MOD) LV vol d, MOD A2C: 74.9 ml LV vol d, MOD A4C: 89.2 ml LV vol s, MOD A2C: 40.0 ml LV vol s, MOD A4C: 41.8 ml LV SV MOD A2C:     34.9 ml LV SV MOD A4C:     89.2 ml LV SV MOD BP:      42.8 ml  RIGHT VENTRICLE            IVC RV Basal diam:  2.60 cm    IVC diam: 1.60 cm RV S prime:     9.90 cm/s TAPSE (M-mode): 1.6 cm  LEFT ATRIUM             Index        RIGHT ATRIUM  Index LA diam:        3.70 cm 1.78 cm/m   RA Area:     12.40 cm LA Vol (A2C):   52.8 ml 25.46 ml/m  RA Volume:   30.00 ml  14.47 ml/m LA Vol (A4C):   49.3 ml 23.77 ml/m LA Biplane Vol: 51.4 ml 24.78 ml/m AORTIC VALVE LVOT Vmax:   85.95 cm/s LVOT Vmean:  58.450 cm/s LVOT VTI:    0.184 m  AORTA Ao Root  diam: 3.60 cm Ao Asc diam:  3.60 cm Ao Desc diam: 2.90 cm  MV E velocity: 50.30 cm/s   TRICUSPID VALVE MV A velocity: 105.00 cm/s  TR Peak grad:   23.2 mmHg MV E/A ratio:  0.48         TR Vmax:        241.00 cm/s  SHUNTS Systemic VTI:  0.18 m Systemic Diam: 2.10 cm  Sreedhar reddy Madireddy Electronically signed by Alean reddy Madireddy Signature Date/Time: 07/24/2023/5:30:35 PM    Final    MONITORS  LONG TERM MONITOR (3-14 DAYS) 07/18/2023  Narrative Patch Wear Time:  2 days and 10 hours  Patient had a min HR of 63 bpm, max HR of 86 bpm, and avg HR of 72 bpm. Predominant underlying rhythm was Sinus Rhythm. 33.6% ventricular ectopy Less than 1% supraventricular ectopy Patient triggered episodes associated with sinus rhythm and PVCs  Will Camnitz, MD       ______________________________________________________________________________________________          Recent Labs: 06/03/2024: TSH 1.190 07/30/2024: ALT 15; BUN 11; Creatinine, Ser 0.97; Hemoglobin 14.1; Platelets 206; Potassium 4.0; Sodium 137  Recent Lipid Panel    Component Value Date/Time   CHOL 120 02/28/2024 1000   TRIG 517 (H) 02/28/2024 1000   HDL 27 (L) 02/28/2024 1000   CHOLHDL 4.4 02/28/2024 1000   LDLCALC 23 02/28/2024 1000    Physical Exam:    VS:  There were no vitals taken for this visit.    Wt Readings from Last 3 Encounters:  07/30/24 190 lb (86.2 kg)  07/01/24 198 lb (89.8 kg)  06/03/24 196 lb 6.4 oz (89.1 kg)     GEN: *** Well nourished, well developed in no acute distress HEENT: Normal NECK: No JVD; No carotid bruits LYMPHATICS: No lymphadenopathy CARDIAC: ***RRR, no murmurs, rubs, gallops RESPIRATORY:  Clear to auscultation without rales, wheezing or rhonchi  ABDOMEN: Soft, non-tender, non-distended MUSCULOSKELETAL:  No edema; No deformity  SKIN: Warm and dry NEUROLOGIC:  Alert and oriented x 3 PSYCHIATRIC:  Normal affect    Signed, Redell Leiter, MD  09/04/2024  12:50 PM    [facility]

## 2024-09-04 ENCOUNTER — Encounter: Payer: Self-pay | Admitting: Family Medicine

## 2024-09-04 ENCOUNTER — Encounter: Payer: Self-pay | Admitting: Cardiology

## 2024-09-04 ENCOUNTER — Ambulatory Visit: Attending: Cardiology | Admitting: Cardiology

## 2024-09-04 DIAGNOSIS — I519 Heart disease, unspecified: Secondary | ICD-10-CM

## 2024-09-04 DIAGNOSIS — I119 Hypertensive heart disease without heart failure: Secondary | ICD-10-CM

## 2024-09-04 DIAGNOSIS — I2510 Atherosclerotic heart disease of native coronary artery without angina pectoris: Secondary | ICD-10-CM

## 2024-09-04 DIAGNOSIS — I493 Ventricular premature depolarization: Secondary | ICD-10-CM

## 2024-09-04 DIAGNOSIS — E782 Mixed hyperlipidemia: Secondary | ICD-10-CM

## 2024-09-04 DIAGNOSIS — I452 Bifascicular block: Secondary | ICD-10-CM

## 2024-09-05 NOTE — Telephone Encounter (Signed)
 Left VM to reschedule both appointments. CB

## 2024-09-08 ENCOUNTER — Other Ambulatory Visit: Payer: Self-pay | Admitting: Family Medicine

## 2024-09-08 ENCOUNTER — Encounter: Payer: Self-pay | Admitting: Family Medicine

## 2024-09-08 MED ORDER — OXYCODONE-ACETAMINOPHEN 5-325 MG PO TABS: 1.0000 | ORAL_TABLET | Freq: Two times a day (BID) | ORAL | 0 refills | Status: DC | PRN

## 2024-09-09 ENCOUNTER — Other Ambulatory Visit: Payer: Self-pay | Admitting: Family Medicine

## 2024-09-09 ENCOUNTER — Other Ambulatory Visit: Payer: Self-pay | Admitting: Cardiology

## 2024-09-09 MED ORDER — DAPAGLIFLOZIN PROPANEDIOL 10 MG PO TABS: 10.0000 mg | ORAL_TABLET | Freq: Every day | ORAL | 0 refills | Status: DC

## 2024-09-10 ENCOUNTER — Encounter: Payer: Self-pay | Admitting: Family Medicine

## 2024-09-24 ENCOUNTER — Ambulatory Visit: Payer: Self-pay

## 2024-09-24 NOTE — Telephone Encounter (Signed)
 Copied from CRM [id number]. Topic: Clinical - Medication Question >> Sep 24, 2024  5:04 PM Hadassah PARAS wrote: Reason for CRM: Pt is taking oxyCODONE -acetaminophen  (PERCOCET/ROXICET) 5-325 MG tablet and this medication can cause respiratory depression. They're wanting to know if maybe this could be change and prescribe new medication. Please advise Clinical Berdine Shed [phone] ext [phone]

## 2024-09-25 ENCOUNTER — Other Ambulatory Visit: Payer: Self-pay | Admitting: Cardiology

## 2024-09-29 NOTE — Progress Notes (Unsigned)
 Cardiology Office Note:    Date:  09/30/2024   ID:  Don [name], DOB July 15, 1951, MRN [id number]  PCP:  Sherre Clapper, MD  Cardiologist:  Redell Leiter, MD    Referring MD: Sherre Clapper, MD    ASSESSMENT:    1. Frequent PVCs   2. RBBB (right bundle branch block with left anterior fascicular block)   3. Hypertensive heart disease, unspecified whether heart failure present   4. Coronary artery disease involving native coronary artery of native heart without angina pectoris   5. Mixed hyperlipidemia   6. Chronic obstructive pulmonary disease, unspecified COPD type (HCC)     PLAN:    In order of problems listed above:  Fascinating is PVCs are markedly improved I think is due to guideline directed treatment and improvement in heart failure and ejection fraction Still element of fluid overload as opposed to increasing diuretics transition losartan  to Entresto should give him functional improvement continue SGLT2 inhibitor loop diuretic spironolactone  and beta-blocker September creatinine 1.02 potassium 4.0 Stay CAD no anginal discomfort on good medical therapy including aspirin  beta-blocker and lipid-lowering Stable severe COPD   Next appointment: 6 months   Medication Adjustments/Labs and Tests Ordered: Current medicines are reviewed at length with the patient today.  Concerns regarding medicines are outlined above.  No orders of the defined types were placed in this encounter.  No orders of the defined types were placed in this encounter.    History of Present Illness:    Don [name] is a 73 y.o. male with a hx of frequent PVCs right bundle branch block and LAHB previous mild left ventricular dysfunction EF 45 to 50% CAD with subsequent ejection fraction September 2000 2450 to 55% with remote PCI of the right coronary artery peripheral arterial disease with remote PCI and stent left common iliac artery 2016 and subsequent PCI and stent right common iliac artery  hypertension hyperlipidemia emphysema type 2 diabetes with diabetic polyneuropathy last seen 06/26/2023.  He was unimproved with antiarrhythmic drug therapy amiodarone  and mexiletine and was felt not to be a good candidate for EP ablation with severe COPD.  Amiodarone  was discontinued because of increasing shortness of breath and hypoxia on home monitoring.  Did not feel to be a candidate for sotalol because of the severity of COPD.  He he was last seen 09/14/2023.  He was seen [facility] ED 04/29/2024 referred from his PCP he was seen because he had GI or GU bleeding he had an EKG performed by his PCP referred to the emergency department there is a note by the emergency room that the EKG is unchanged his previous baseline he had 2 sets of troponin assays which did not show evidence of acute coronary syndrome hemoglobin was 15.3 creatinine 0.9 potassium 4.1 he had hyponatremia BNP level was low at 136 it appears as if he was discharged from the emergency room that EKG reviewed by me showed sinus rhythm right bundle branch and an unusual northwest axis.  There were no acute ischemic changes and a chest CT performed that showed emphysema bilateral lung infiltrates.  Compliance with diet, lifestyle and medications: Yes  His wife is present she manages his complex medical illnesses He is improved he still has some shortness of breath with ADLs sleeps with the head of the bed elevated trace edema He certainly has improved with guideline directed treatment we will going to do as opposed to increasing doses of diuretic his transition losartan  to Entresto over a 2-week period  time return check BMP and proBNP in show check blood pressures at home His EKG in the summer showed no PVCs And another 1 in October but I cannot see it in epic He is not having palpitation or syncope Past Medical History:  Diagnosis Date   Abdominal pain 06/03/2024   Abnormal chest CT 02/01/2024   Absence of bladder continence  01/08/2022   Acquired hypothyroidism 03/02/2024   Acute infection of nasal sinus 01/13/2023   Acute non-recurrent maxillary sinusitis 01/13/2023   Allergy 1989   Anxiety state 02/02/2022   Arthritis    Asymptomatic LV dysfunction 10/18/2017   Atherosclerosis of aorta 01/13/2023   B12 deficiency anemia 08/25/2021   Balance problem 07/01/2024   Basal cell carcinoma    Benign prostatic hyperplasia with incomplete bladder emptying 01/14/2019   Blood in ear canal, right 06/03/2024   Cancer (HCC)    throat - 1997, throat - 2018   Cardiomyopathy, secondary (HCC) 12/01/2016   Overview:  EF 47% 12/26/16   CHF (congestive heart failure) (HCC) 10/2002   Chronic coronary artery disease 10/18/2017   Chronic cough 12/16/2023   Chronic ischemic heart disease 11/13/1998   Jan 22, 2003 Entered By: CHRISTOPHER OLIVE J Comment:  massive Mi in 2000 per patient hsitory, 2nd Mi in 2001Mar 11, 2004 Entered By: CHRISTOPHER OLIVE J Comment: Had stent placedin 2000,cath/angio in 2001   Chronic kidney disease    Chronic neck pain 05/16/2022   Chronic pain syndrome 06/04/2022   Chronic respiratory failure with hypoxia (HCC) 05/16/2022   COPD (chronic obstructive pulmonary disease) (HCC)    COPD exacerbation (HCC) 07/30/2023   Coronary artery disease involving native coronary artery of native heart with angina pectoris 12/01/2016   Overview:  He has hx of IWMI in remote past, last cath in 2012 at [facility] showed chronic total occlusion of previously stented RCA, with good collaterals, a 40% LAD stenosis, inferior hypokinesis and EF 45%.    He's been lost to Cardiology f/u since 2015, but has not had any recurrent events   Deficiency anemia 08/25/2021   Diabetic polyneuropathy associated with type 2 diabetes mellitus (HCC) 05/16/2022   Dyslipidemia 12/01/2016   Dysuria 04/29/2024   Emphysema of lung (HCC)    Encounter for Medicare annual wellness exam 01/13/2023   Erectile dysfunction due to diseases classified elsewhere  06/12/2019   GAD (generalized anxiety disorder) 02/02/2022   GERD (gastroesophageal reflux disease)    Hallucination 10/08/2022   History of recent fall 06/03/2024   Hyperlipidemia    Hypertension    Hypertensive heart disease with heart failure (HCC)    Insomnia 01/28/2021   Intercostal pain 04/29/2024   Iron  deficiency 07/07/2023   Iron  deficiency anemia due to chronic blood loss 08/25/2021   Lesion of vocal cord    Leukoplakia of larynx 11/15/2016   Long term (current) use of systemic steroids 12/16/2023   Long-term use of aspirin  therapy 10/02/2018   Lumbar back pain 06/04/2022   Lung nodule 11/30/2023   Lung nodules 08/17/2022   Macular degeneration of both eyes 03/02/2024   Malfunction of penile prosthesis 01/14/2019   Malignant neoplasm of skin 01/28/2021   Formatting of this note might be different from the original. Jan 22, 2003 Entered By: CHRISTOPHER OLIVE J Comment: of skin - removed x2 inpast Jan 22, 2003 Entered By: CHRISTOPHER OLIVE J Comment: of skin - removed x2 inpast   Melanoma of back (HCC)    melanoma on back   Memory loss 03/08/2020   Mixed simple and mucopurulent chronic  bronchitis (HCC) 03/02/2024   Moderate recurrent major depression (HCC) 03/02/2023   Mucopurulent chronic bronchitis (HCC) 04/08/2023   Myocardial infarction (HCC) 2000   Oropharyngeal dysphagia 08/12/2018   Other ill-defined and unknown causes of morbidity and mortality 11/13/1993   Formatting of this note might be different from the original. Jan 31, 2008 Entered By: ARMOUR,ROSS B Comment: lumbar, 8/08 Jan 22, 2003 Entered By: CHRISTOPHER OLIVE J Comment: x32yrs in work place  quit 79yrs ago in 2000 Jan 22, 2003 Entered By: CHRISTOPHER OLIVE J Comment: x61yrs in work place  quit 28yrs ago in 2000 Jan 31, 2008 Entered By: MEMORY GULL B Comment: lumbar, 8/08   Other microscopic hematuria 04/29/2024   Oxygen  deficiency    PAD (peripheral artery disease) 10/18/2017   Palpitations 04/29/2024   Peripheral  vascular disease    iliac artery clot   Peyronie's disease 06/12/2019   PVC (premature ventricular contraction) 10/09/2023   RBBB 10/09/2023   Sleep apnea    Steroid myopathy 02/01/2024   Stroke (HCC)    Tia they found on MRI   Tobacco use disorder 06/12/2019   Type 2 diabetes mellitus with hyperglycemia, with long-term current use of insulin  (HCC) 07/30/2023   URI, acute 05/31/2023   Urinary urgency 04/08/2023   Ventricular ectopy 11/09/2022   Vertigo 04/08/2023   Vitamin D  deficiency disease 01/13/2023    Current Medications: Current Meds  Medication Sig   omeprazole  (PRILOSEC) 20 MG capsule Take 20 mg by mouth daily.   Current Facility-Administered Medications for the 09/30/24 encounter (Office Visit) with Monetta Redell PARAS, MD  Medication   ipratropium-albuterol  (DUONEB) 0.5-2.5 (3) MG/3ML nebulizer solution 3 mL      EKGs/Labs/Other Studies Reviewed:    The following studies were reviewed today:  Cardiac Studies & Procedures   ______________________________________________________________________________________________   STRESS TESTS  MYOCARDIAL PERFUSION IMAGING 05/24/2022  Interpretation Summary   The study is normal. The study is intermediate risk.   LV perfusion is abnormal. There is no evidence of ischemia. There is evidence of infarction. Defect 1: There is a medium defect with moderate reduction in uptake present in the inferior location(s) that is fixed. There is abnormal wall motion in the defect area. Consistent with infarction.   Left ventricular function is abnormal. Global function is mildly reduced. Nuclear stress EF: 49 %. The left ventricular ejection fraction is mildly decreased (45-54%). End diastolic cavity size is normal.   Prior study not available for comparison.   ECHOCARDIOGRAM  ECHOCARDIOGRAM COMPLETE 07/24/2023  Narrative ECHOCARDIOGRAM REPORT    Patient Name:   Don [name] Date of Exam: 07/24/2023 Medical Rec #:  [id number]           Height:       70.0 in Accession #:    [id number]         Weight:       197.0 lb Date of Birth:  Jul 27, 1951          BSA:          2.074 m Patient Age:    71 years           BP:           124/56 mmHg Patient Gender: M                  HR:           72 bpm. Exam Location:    Procedure: 2D Echo, Cardiac Doppler, Color Doppler and Intracardiac Opacification Agent  Indications:  Frequent PVCs [I49.3 (ICD-10-CM)]; RBBB (right bundle branch block with left anterior fascicular block) [I45.2 (ICD-10-CM)]; Coronary artery disease involving native coronary artery of native heart without angina pectoris [I25.10 (ICD-10-CM)]; Essential hypertension [I10 (ICD-10-CM)]; Mixed hyperlipidemia [E78.2 (ICD-10-CM)]  History:        Patient has prior history of Echocardiogram examinations, most recent 07/25/2022.  Sonographer:    Lynwood Silvas RDCS Referring Phys: [id number] [name]   Sonographer Comments: Technically challenging study due to limited acoustic windows. Global longitudinal strain was attempted. IMPRESSIONS   1. Left ventricular ejection fraction, by estimation, is 50 to 55%. The left ventricle has low normal function. The left ventricle demonstrates global hypokinesis. Left ventricular diastolic parameters are indeterminate. 2. Right ventricular systolic function is normal. The right ventricular size is normal. There is normal pulmonary artery systolic pressure. 3. Trace to small pericardial effusion anteriorlly. There is no evidence of cardiac tamponade. 4. The mitral valve was not well visualized. Trivial mitral valve regurgitation. No evidence of mitral stenosis. 5. The aortic valve is tricuspid. Aortic valve regurgitation is not visualized. No aortic stenosis is present. 6. There is borderline dilatation of the ascending aorta, measuring 36 mm. 7. The inferior vena cava is normal in size with greater than 50% respiratory variability, suggesting right atrial pressure of 3  mmHg.  Comparison(s): A prior study was performed on 06/03/2021. LVEF was 55-60% and small pericardial effusion and fat pad reported.  FINDINGS Left Ventricle: Left ventricular ejection fraction, by estimation, is 50 to 55%. The left ventricle has low normal function. The left ventricle demonstrates global hypokinesis. Definity  contrast agent was given IV to delineate the left ventricular endocardial borders. The left ventricular internal cavity size was normal in size. There is no left ventricular hypertrophy. Left ventricular diastolic parameters are indeterminate.  Right Ventricle: The right ventricular size is normal. Right vetricular wall thickness was not well visualized. Right ventricular systolic function is normal. There is normal pulmonary artery systolic pressure. The tricuspid regurgitant velocity is 2.41 m/s, and with an assumed right atrial pressure of 3 mmHg, the estimated right ventricular systolic pressure is 26.2 mmHg.  Left Atrium: Left atrial size was not well visualized.  Right Atrium: Right atrial size was not well visualized.  Pericardium: Trivial pericardial effusion is present. The pericardial effusion is localized near the right atrium and anterior to the right ventricle. There is no evidence of cardiac tamponade. Presence of epicardial fat layer.  Mitral Valve: The mitral valve was not well visualized. Trivial mitral valve regurgitation. No evidence of mitral valve stenosis.  Tricuspid Valve: The tricuspid valve is not well visualized. Tricuspid valve regurgitation is trivial.  Aortic Valve: The aortic valve is tricuspid. Aortic valve regurgitation is not visualized. No aortic stenosis is present.  Pulmonic Valve: The pulmonic valve was not well visualized. Pulmonic valve regurgitation is not visualized.  Aorta: The aortic root is normal in size and structure. There is borderline dilatation of the ascending aorta, measuring 36 mm.  Venous: The inferior vena cava  is normal in size with greater than 50% respiratory variability, suggesting right atrial pressure of 3 mmHg.  IAS/Shunts: The interatrial septum was not well visualized.   LEFT VENTRICLE PLAX 2D LVIDd:         5.70 cm     Diastology LVIDs:         3.80 cm     LV e' medial:    5.55 cm/s LV PW:         1.10 cm  LV E/e' medial:  9.1 LV IVS:        1.10 cm     LV e' lateral:   6.64 cm/s LVOT diam:     2.10 cm     LV E/e' lateral: 7.6 LV SV:         64 LV SV Index:   31 LVOT Area:     3.46 cm  LV Volumes (MOD) LV vol d, MOD A2C: 74.9 ml LV vol d, MOD A4C: 89.2 ml LV vol s, MOD A2C: 40.0 ml LV vol s, MOD A4C: 41.8 ml LV SV MOD A2C:     34.9 ml LV SV MOD A4C:     89.2 ml LV SV MOD BP:      42.8 ml  RIGHT VENTRICLE            IVC RV Basal diam:  2.60 cm    IVC diam: 1.60 cm RV S prime:     9.90 cm/s TAPSE (M-mode): 1.6 cm  LEFT ATRIUM             Index        RIGHT ATRIUM           Index LA diam:        3.70 cm 1.78 cm/m   RA Area:     12.40 cm LA Vol (A2C):   52.8 ml 25.46 ml/m  RA Volume:   30.00 ml  14.47 ml/m LA Vol (A4C):   49.3 ml 23.77 ml/m LA Biplane Vol: 51.4 ml 24.78 ml/m AORTIC VALVE LVOT Vmax:   85.95 cm/s LVOT Vmean:  58.450 cm/s LVOT VTI:    0.184 m  AORTA Ao Root diam: 3.60 cm Ao Asc diam:  3.60 cm Ao Desc diam: 2.90 cm  MV E velocity: 50.30 cm/s   TRICUSPID VALVE MV A velocity: 105.00 cm/s  TR Peak grad:   23.2 mmHg MV E/A ratio:  0.48         TR Vmax:        241.00 cm/s  SHUNTS Systemic VTI:  0.18 m Systemic Diam: 2.10 cm  Sreedhar reddy Madireddy Electronically signed by Alean reddy Madireddy Signature Date/Time: 07/24/2023/5:30:35 PM    Final    MONITORS  LONG TERM MONITOR (3-14 DAYS) 07/18/2023  Narrative Patch Wear Time:  2 days and 10 hours  Patient had a min HR of 63 bpm, max HR of 86 bpm, and avg HR of 72 bpm. Predominant underlying rhythm was Sinus Rhythm. 33.6% ventricular ectopy Less than 1% supraventricular  ectopy Patient triggered episodes associated with sinus rhythm and PVCs  Will Camnitz, MD       ______________________________________________________________________________________________          Recent Labs: 06/03/2024: TSH 1.190 07/30/2024: ALT 15; BUN 11; Creatinine, Ser 0.97; Hemoglobin 14.1; Platelets 206; Potassium 4.0; Sodium 137  Recent Lipid Panel    Component Value Date/Time   CHOL 120 02/28/2024 1000   TRIG 517 (H) 02/28/2024 1000   HDL 27 (L) 02/28/2024 1000   CHOLHDL 4.4 02/28/2024 1000   LDLCALC 23 02/28/2024 1000    Physical Exam:    VS:  BP (!) 112/54   Pulse 74   Ht 5' 10 (1.778 m)   Wt 198 lb (89.8 kg)   SpO2 91%   BMI 28.41 kg/m     Wt Readings from Last 3 Encounters:  09/30/24 198 lb (89.8 kg)  07/30/24 190 lb (86.2 kg)  07/01/24 198 lb (89.8 kg)  GEN: Improved appearance well nourished, well developed in no acute distress COPD appearance HEENT: Normal NECK: No JVD; No carotid bruits LYMPHATICS: No lymphadenopathy CARDIAC: Distant heart sounds RRR, no murmurs, rubs, gallops RESPIRATORY:-And flatus diminished breath sounds prolonged expiration ABDOMEN: Soft, non-tender, non-distended MUSCULOSKELETAL:  No edema; No deformity  SKIN: Warm and dry NEUROLOGIC:  Alert and oriented x 3 PSYCHIATRIC:  Normal affect    Signed, Redell Leiter, MD  09/30/2024 2:51 PM    [facility]

## 2024-09-30 ENCOUNTER — Ambulatory Visit: Attending: Cardiology | Admitting: Cardiology

## 2024-09-30 ENCOUNTER — Encounter: Payer: Self-pay | Admitting: Cardiology

## 2024-09-30 VITALS — BP 112/54 | HR 74 | Ht 70.0 in | Wt 198.0 lb

## 2024-09-30 DIAGNOSIS — I452 Bifascicular block: Secondary | ICD-10-CM

## 2024-09-30 DIAGNOSIS — I2510 Atherosclerotic heart disease of native coronary artery without angina pectoris: Secondary | ICD-10-CM | POA: Diagnosis not present

## 2024-09-30 DIAGNOSIS — E782 Mixed hyperlipidemia: Secondary | ICD-10-CM

## 2024-09-30 DIAGNOSIS — I119 Hypertensive heart disease without heart failure: Secondary | ICD-10-CM | POA: Diagnosis not present

## 2024-09-30 DIAGNOSIS — J449 Chronic obstructive pulmonary disease, unspecified: Secondary | ICD-10-CM

## 2024-09-30 DIAGNOSIS — I493 Ventricular premature depolarization: Secondary | ICD-10-CM | POA: Diagnosis not present

## 2024-09-30 MED ORDER — SACUBITRIL-VALSARTAN 24-26 MG PO TABS: 1.0000 | ORAL_TABLET | Freq: Two times a day (BID) | ORAL | 3 refills | Status: DC

## 2024-09-30 NOTE — Patient Instructions (Signed)
 Medication Instructions:  Your physician has recommended you make the following change in your medication:  STOP: Losartan  for two days then START: Entresto 24 - 26 two times daily (Take this medication once daily for 1 week, if blood pressure is greater than 100 increase to two times daily)   *If you need a refill on your cardiac medications before your next appointment, please call your pharmacy*  Lab Work: Your physician recommends that you return for lab work in:   Labs in 2 weeks: BMP, Pro BNP  If you have labs (blood work) drawn today and your tests are completely normal, you will receive your results only by: MyChart Message (if you have MyChart) OR A paper copy in the mail If you have any lab test that is abnormal or we need to change your treatment, we will call you to review the results.  Testing/Procedures: None  Follow-Up: At [facility], you and your health needs are our priority.  As part of our continuing mission to provide you with exceptional heart care, our providers are all part of one team.  This team includes your primary Cardiologist (physician) and Advanced Practice Providers or APPs (Physician Assistants and Nurse Practitioners) who all work together to provide you with the care you need, when you need it.  Your next appointment:   6 month(s)  Provider:   Redell Leiter, MD    We recommend signing up for the patient portal called MyChart.  Sign up information is provided on this After Visit Summary.  MyChart is used to connect with patients for Virtual Visits (Telemedicine).  Patients are able to view lab/test results, encounter notes, upcoming appointments, etc.  Non-urgent messages can be sent to your provider as well.   To learn more about what you can do with MyChart, go to [email or web address].   Other Instructions Pump[employer] ([employer] or [employer])  Please keep a BP log for 2 weeks and send by [employer] or mail.                      Dr.  Leiter [address] [place], [place] [address]  Blood Pressure Record Sheet To take your blood pressure, you will need a blood pressure machine. You can buy a blood pressure machine (blood pressure monitor) at your clinic, drug store, or online. When choosing one, consider: An automatic monitor that has an arm cuff. A cuff that wraps snugly around your upper arm. You should be able to fit only one finger between your arm and the cuff. A device that stores blood pressure reading results. Do not choose a monitor that measures your blood pressure from your wrist or finger. Follow your health care provider's instructions for how to take your blood pressure. To use this form: Get one reading in the morning (a.m.) 1-2 hours after you take any medicines. Get one reading in the evening (p.m.) before supper.   Blood pressure log Date: _______________________  a.m. _____________________(1st reading) HR___________            p.m. _____________________(2nd reading) HR__________  Date: _______________________  a.m. _____________________(1st reading) HR___________            p.m. _____________________(2nd reading) HR__________  Date: _______________________  a.m. _____________________(1st reading) HR___________            p.m. _____________________(2nd reading) HR__________  Date: _______________________  a.m. _____________________(1st reading) HR___________            p.m. _____________________(2nd reading) HR__________  Date: _______________________  a.m. _____________________(1st reading) HR___________            p.m. _____________________(2nd reading) HR__________  Date: _______________________  a.m. _____________________(1st reading) HR___________            p.m. _____________________(2nd reading) HR__________  Date: _______________________  a.m. _____________________(1st reading) HR___________            p.m. _____________________(2nd reading) HR__________   This  information is not intended to replace advice given to you by your health care provider. Make sure you discuss any questions you have with your health care provider. Document Revised: 02/18/2020 Document Reviewed: 02/18/2020 Elsevier Patient Education  2021 [employer].

## 2024-10-02 ENCOUNTER — Other Ambulatory Visit: Payer: Self-pay | Admitting: Family Medicine

## 2024-10-02 ENCOUNTER — Encounter: Payer: Self-pay | Admitting: Cardiology

## 2024-10-02 ENCOUNTER — Other Ambulatory Visit: Payer: Self-pay | Admitting: Cardiology

## 2024-10-02 DIAGNOSIS — E1142 Type 2 diabetes mellitus with diabetic polyneuropathy: Secondary | ICD-10-CM

## 2024-10-04 ENCOUNTER — Other Ambulatory Visit: Payer: Self-pay | Admitting: Cardiology

## 2024-10-04 ENCOUNTER — Other Ambulatory Visit: Payer: Self-pay | Admitting: Family Medicine

## 2024-10-04 DIAGNOSIS — J418 Mixed simple and mucopurulent chronic bronchitis: Secondary | ICD-10-CM

## 2024-10-05 ENCOUNTER — Other Ambulatory Visit: Payer: Self-pay | Admitting: Family Medicine

## 2024-10-05 ENCOUNTER — Ambulatory Visit (HOSPITAL_BASED_OUTPATIENT_CLINIC_OR_DEPARTMENT_OTHER)

## 2024-10-05 DIAGNOSIS — F411 Generalized anxiety disorder: Secondary | ICD-10-CM

## 2024-10-06 ENCOUNTER — Encounter: Payer: Self-pay | Admitting: Family Medicine

## 2024-10-07 ENCOUNTER — Encounter: Payer: Self-pay | Admitting: Family Medicine

## 2024-10-07 ENCOUNTER — Other Ambulatory Visit: Payer: Self-pay | Admitting: Family Medicine

## 2024-10-07 MED ORDER — OXYCODONE-ACETAMINOPHEN 5-325 MG PO TABS: 1.0000 | ORAL_TABLET | Freq: Two times a day (BID) | ORAL | 0 refills | Status: DC | PRN

## 2024-10-07 NOTE — Progress Notes (Signed)
 Subjective:  Patient ID: Don [name], male    DOB: February 09, 1951  Age: 73 y.o. MRN: [id number]  Chief Complaint  Patient presents with   COPD    COPD flare up x's two weeks.    HPI: Discussed the use of AI scribe software for clinical note transcription with the patient, who gave verbal consent to proceed.  History of Present Illness Don [name] is a 73 year old male who presents with respiratory symptoms and difficulty breathing.  Respiratory symptoms - Cough productive of thick sputum - Symptoms worsen at night - Dyspnea with exertion, such as walking long distances - Bedridden for the last three days due to feeling unwell - Fatigue with minimal activity, including walking to the bathroom - Symptoms primarily located in the lungs - No fevers, chills, sweats, earaches, or sore throat - Symptom flares are typically alleviated by current treatments  Musculoskeletal pain - Significant lower back pain associated with coughing - Pain attributed to intensity of cough - Pain managed with pain medication       07/30/2024   10:00 AM 02/28/2024    9:44 AM 11/29/2023    9:38 AM 05/31/2023   10:38 AM 04/03/2023    4:09 PM  Depression screen PHQ 2/9  Decreased Interest 0 1 2 2  0  Down, Depressed, Hopeless 0 0 2 0 0  PHQ - 2 Score 0 1 4 2  0  Altered sleeping 0 0 2 2   Tired, decreased energy 0 1 2 0   Change in appetite 0 0 0 2   Feeling bad or failure about yourself  0 0 0 0   Trouble concentrating 0 0 1 1   Moving slowly or fidgety/restless 0 0 0 0   Suicidal thoughts 0 0 0 0   PHQ-9 Score 0  2  9  7     Difficult doing work/chores Not difficult at all Not difficult at all Somewhat difficult Very difficult      Data saved with a previous flowsheet row definition        07/30/2024   10:00 AM  Fall Risk   Falls in the past year? 1  Number falls in past yr: 0  Injury with Fall? 0  Risk for fall due to : No Fall Risks  Follow up Falls evaluation completed       Current Outpatient Medications on File Prior to Visit  Medication Sig Dispense Refill   arformoterol  (BROVANA ) 15 MCG/2ML NEBU Take 2 mLs (15 mcg total) by nebulization 2 (two) times daily. 120 mL 6   aspirin  EC 81 MG tablet Take 1 tablet (81 mg total) by mouth daily. Swallow whole. 150 tablet 2   budesonide  (PULMICORT ) 0.25 MG/2ML nebulizer solution Take 2 mLs (0.25 mg total) by nebulization in the morning and at bedtime. 60 mL 11   busPIRone  (BUSPAR ) 7.5 MG tablet Take 1 tablet by mouth twice daily 60 tablet 5   clopidogrel  (PLAVIX ) 75 MG tablet Take 1 tablet by mouth once daily 90 tablet 3   Continuous Glucose Sensor (DEXCOM G7 SENSOR) MISC Every 10 days 9 each 3   cyanocobalamin  (VITAMIN B12) 1000 MCG/ML injection INJECT 1 ML (CC) INTRAMUSCULARLY ONCE A WEEK 10 mL 0   cyclobenzaprine  (FLEXERIL ) 10 MG tablet Take 1 tablet by mouth three times daily as needed for muscle spasm 90 tablet 3   dapagliflozin  propanediol (FARXIGA ) 10 MG TABS tablet Take 1 tablet (10 mg total) by mouth daily. 90 tablet 0  doxycycline  (VIBRA -TABS) 100 MG tablet Take 1 tablet (100 mg total) by mouth 2 (two) times daily. 20 tablet 0   DULoxetine  (CYMBALTA ) 60 MG capsule Take 1 capsule by mouth twice daily 60 capsule 5   Dupilumab  (DUPIXENT ) 300 MG/2ML SOAJ Inject 300 mg into the skin every 14 (fourteen) days. 12 mL 1   finasteride  (PROSCAR ) 5 MG tablet Take 1 tablet by mouth once daily 90 tablet 0   gabapentin  (NEURONTIN ) 400 MG capsule TAKE 1 CAPSULE BY MOUTH THREE TIMES DAILY 270 capsule 0   insulin  lispro (HUMALOG ) 100 UNIT/ML injection Inject 0.04 mLs (4 Units total) into the skin 3 (three) times daily with meals. (Patient taking differently: Inject 5 Units into the skin 3 (three) times daily as needed for high blood sugar (higher than 400 give 5 units).) 10 mL 11   Insulin  Pen Needle (PEN NEEDLES) 31G X 8 MM MISC Inject insulin  as directed 100 each 1   ipratropium-albuterol  (DUONEB) 0.5-2.5 (3) MG/3ML SOLN  USE 1 AMPULE IN NEBULIZER 4 TIMES DAILY 360 mL 0   Lancets (ONETOUCH DELICA PLUS LANCET33G) MISC USE 1  TO CHECK GLUCOSE THREE TIMES DAILY (Patient taking differently: 1 each by Other route 3 (three) times daily.) 100 each 0   levothyroxine  (SYNTHROID ) 25 MCG tablet Take 1 tablet by mouth once daily 90 tablet 0   Magnesium  Chloride (MAG64) 64 MG TBEC Take 1 tablet (64 mg total) by mouth daily. 90 tablet 1   metoprolol  succinate (TOPROL -XL) 100 MG 24 hr tablet Take 1 tablet (100 mg total) by mouth daily. 90 tablet 3   nitroGLYCERIN  (NITROSTAT ) 0.4 MG SL tablet Place 1 tablet (0.4 mg total) under the tongue every 5 (five) minutes as needed for chest pain. DISSOLVE ONE TABLET UNDER THE TONGUE EVERY 5 TO 10 MINUTES PRIOR TO ACTIVITIES WHICH MIGHT PRECIPITATE AN ATTACK 25 tablet 0   omega-3 acid ethyl esters (LOVAZA ) 1 g capsule Take 2 capsules (2 g total) by mouth 2 (two) times daily. 360 capsule 1   omeprazole  (PRILOSEC) 20 MG capsule Take 20 mg by mouth daily.     ONETOUCH ULTRA test strip USE 1 STRIP TO CHECK GLUCOSE IN THE MORNING AND 1 AT BEDTIME 100 each 0   oxyCODONE -acetaminophen  (PERCOCET/ROXICET) 5-325 MG tablet Take 1 tablet by mouth 2 (two) times daily as needed for severe pain (pain score 7-10). 60 tablet 0   [START ON 11/04/2024] oxyCODONE -acetaminophen  (PERCOCET/ROXICET) 5-325 MG tablet Take 1 tablet by mouth 2 (two) times daily as needed for severe pain (pain score 7-10). 60 tablet 0   pantoprazole  (PROTONIX ) 40 MG tablet Take 1 tablet by mouth once daily 90 tablet 0   predniSONE  (DELTASONE ) 10 MG tablet Take 1 tablet by mouth once daily with breakfast 30 tablet 5   RELION INSULIN  SYRINGE 31G X 15/64 0.3 ML MISC AS DIRECTED 100 each 0   revefenacin  (YUPELRI ) 175 MCG/3ML nebulizer solution Take 3 mLs (175 mcg total) by nebulization daily. 90 mL 11   rosuvastatin  (CRESTOR ) 20 MG tablet Take 1 tablet (20 mg total) by mouth daily. 90 tablet 3   sacubitril -valsartan  (ENTRESTO ) 24-26 MG Take 1  tablet by mouth 2 (two) times daily. Take this medication once daily for 1 week, then if Blood pressure is > 100 increase to two times daily. 180 tablet 3   spironolactone  (ALDACTONE ) 25 MG tablet Take 1 tablet by mouth once daily 90 tablet 1   tamsulosin  (FLOMAX ) 0.4 MG CAPS capsule TAKE 2 CAPSULES BY MOUTH  ONCE DAILY AFTER SUPPER 180 capsule 0   torsemide  (DEMADEX ) 20 MG tablet Take 1 tablet (20 mg total) by mouth daily as needed (fluid or edema). 90 tablet 3   TOUJEO  SOLOSTAR 300 UNIT/ML Solostar Pen Inject 40 Units into the skin daily. Inject 40 units daily, increase by 2 units every 3 days until sugars are constantly 140 or lower. Max dose of 60 units daily. (Patient taking differently: Inject 50 Units into the skin daily. Inject 50 units daily, increase by 2 units every 3 days until sugars are constantly 140 or lower. Max dose of 60 units daily.) 18 mL 0   traZODone  (DESYREL ) 150 MG tablet Take 1 tablet by mouth once daily 270 tablet 0   VENTOLIN  HFA 108 (90 Base) MCG/ACT inhaler INHALE 2 PUFFS BY MOUTH EVERY 6 HOURS AS NEEDED FOR SHORTNESS OF BREATH FOR WHEEZING (Patient taking differently: Inhale 2 puffs into the lungs every 6 (six) hours as needed for shortness of breath or wheezing.) 18 g 0   Vitamin D , Ergocalciferol , (DRISDOL ) 1.25 MG (50000 UNIT) CAPS capsule TAKE 1 CAPSULE BY MOUTH TWICE A WEEK 12 capsule 0   ALPRAZolam  (XANAX ) 0.25 MG tablet Take 1 tablet by mouth twice daily as needed for anxiety 60 tablet 0   Current Facility-Administered Medications on File Prior to Visit  Medication Dose Route Frequency Provider Last Rate Last Admin   ipratropium-albuterol  (DUONEB) 0.5-2.5 (3) MG/3ML nebulizer solution 3 mL  3 mL Nebulization Once        Past Medical History:  Diagnosis Date   Abdominal pain 06/03/2024   Abnormal chest CT 02/01/2024   Absence of bladder continence 01/08/2022   Acquired hypothyroidism 03/02/2024   Acute infection of nasal sinus 01/13/2023   Acute non-recurrent  maxillary sinusitis 01/13/2023   Allergy 1989   Anxiety state 02/02/2022   Arthritis    Asymptomatic LV dysfunction 10/18/2017   Atherosclerosis of aorta 01/13/2023   B12 deficiency anemia 08/25/2021   Balance problem 07/01/2024   Basal cell carcinoma    Benign prostatic hyperplasia with incomplete bladder emptying 01/14/2019   Blood in ear canal, right 06/03/2024   Cancer (HCC) 1997   throat - 1997, throat - 2018   Cardiomyopathy, secondary (HCC) 12/01/2016   Overview:  EF 47% 12/26/16   CHF (congestive heart failure) (HCC) 10/2002   Chronic coronary artery disease 10/18/2017   Chronic cough 12/16/2023   Chronic ischemic heart disease 11/13/1998   Jan 22, 2003 Entered By: CHRISTOPHER OLIVE J Comment:  massive Mi in 2000 per patient hsitory, 2nd Mi in 2001Mar 11, 2004 Entered By: CHRISTOPHER OLIVE J Comment: Had stent placedin 2000,cath/angio in 2001   Chronic kidney disease    Chronic neck pain 05/16/2022   Chronic pain syndrome 06/04/2022   Chronic respiratory failure with hypoxia (HCC) 05/16/2022   COPD (chronic obstructive pulmonary disease) (HCC)    COPD exacerbation (HCC) 07/30/2023   Coronary artery disease involving native coronary artery of native heart with angina pectoris 12/01/2016   Overview:  He has hx of IWMI in remote past, last cath in 2012 at [facility] showed chronic total occlusion of previously stented RCA, with good collaterals, a 40% LAD stenosis, inferior hypokinesis and EF 45%.    He's been lost to Cardiology f/u since 2015, but has not had any recurrent events   Deficiency anemia 08/25/2021   Diabetic polyneuropathy associated with type 2 diabetes mellitus (HCC) 05/16/2022   Dyslipidemia 12/01/2016   Dysuria 04/29/2024   Emphysema of lung (HCC)  Encounter for Medicare annual wellness exam 01/13/2023   Erectile dysfunction due to diseases classified elsewhere 06/12/2019   GAD (generalized anxiety disorder) 02/02/2022   GERD (gastroesophageal reflux disease)     Hallucination 10/08/2022   History of recent fall 06/03/2024   Hyperlipidemia    Hypertension    Hypertensive heart disease with heart failure (HCC)    Insomnia 01/28/2021   Intercostal pain 04/29/2024   Iron  deficiency 07/07/2023   Iron  deficiency anemia due to chronic blood loss 08/25/2021   Lesion of vocal cord    Leukoplakia of larynx 11/15/2016   Long term (current) use of systemic steroids 12/16/2023   Long-term use of aspirin  therapy 10/02/2018   Lumbar back pain 06/04/2022   Lung nodule 11/30/2023   Lung nodules 08/17/2022   Macular degeneration of both eyes 03/02/2024   Malfunction of penile prosthesis 01/14/2019   Malignant neoplasm of skin 01/28/2021   Formatting of this note might be different from the original. Jan 22, 2003 Entered By: CHRISTOPHER OLIVE J Comment: of skin - removed x2 inpast Jan 22, 2003 Entered By: CHRISTOPHER OLIVE J Comment: of skin - removed x2 inpast   Melanoma of back (HCC)    melanoma on back   Memory loss 03/08/2020   Mixed simple and mucopurulent chronic bronchitis (HCC) 03/02/2024   Moderate recurrent major depression (HCC) 03/02/2023   Mucopurulent chronic bronchitis (HCC) 04/08/2023   Myocardial infarction (HCC) 2000   Oropharyngeal dysphagia 08/12/2018   Other ill-defined and unknown causes of morbidity and mortality 11/13/1993   Formatting of this note might be different from the original. Jan 31, 2008 Entered By: ARMOUR,ROSS B Comment: lumbar, 8/08 Jan 22, 2003 Entered By: CHRISTOPHER OLIVE J Comment: x76yrs in work place  quit 59yrs ago in 2000 Jan 22, 2003 Entered By: CHRISTOPHER OLIVE J Comment: x43yrs in work place  quit 11yrs ago in 2000 Jan 31, 2008 Entered By: MEMORY GULL B Comment: lumbar, 8/08   Other microscopic hematuria 04/29/2024   Oxygen  deficiency    PAD (peripheral artery disease) 10/18/2017   Palpitations 04/29/2024   Peripheral vascular disease    iliac artery clot   Peyronie's disease 06/12/2019   PVC (premature ventricular  contraction) 10/09/2023   RBBB 10/09/2023   Sleep apnea    Steroid myopathy 02/01/2024   Stroke (HCC)    Tia they found on MRI   Tobacco use disorder 06/12/2019   Type 2 diabetes mellitus with hyperglycemia, with long-term current use of insulin  (HCC) 07/30/2023   URI, acute 05/31/2023   Urinary urgency 04/08/2023   Ventricular ectopy 11/09/2022   Vertigo 04/08/2023   Vitamin D  deficiency disease 01/13/2023   Past Surgical History:  Procedure Laterality Date   ABDOMINAL ANGIOGRAM N/A 01/13/2015   Procedure: ABDOMINAL ANGIOGRAM;  Surgeon: Krystal JULIANNA Doing, MD;  [facility] C[facility] LAB;  Service: Cardiovascular;  Laterality: N/A;   ABDOMINAL AORTOGRAM W/LOWER EXTREMITY N/A 05/27/2024   Procedure: ABDOMINAL AORTOGRAM W/LOWER EXTREMITY;  Surgeon: Serene Gaile ORN, MD;  Location: [facility] INVASIVE CV LAB;  Service: Cardiovascular;  Laterality: N/A;   APPENDECTOMY     BACK SURGERY     CARDIAC CATHETERIZATION  2000/2012   with stents in 2000   CHOLECYSTECTOMY     COLONOSCOPY     ELBOW SURGERY     bilateral   ESOPHAGOGASTRODUODENOSCOPY     KNEE SURGERY Left    LOWER EXTREMITY ANGIOGRAPHY N/A 05/27/2024   Procedure: Lower Extremity Angiography;  Surgeon: Serene Gaile ORN, MD;  Location: [facility] INVASIVE CV LAB;  Service: Cardiovascular;  Laterality: N/A;   LOWER EXTREMITY INTERVENTION N/A 05/27/2024   Procedure: LOWER EXTREMITY INTERVENTION;  Surgeon: Serene Gaile ORN, MD;  Location: [facility] INVASIVE CV LAB;  Service: Cardiovascular;  Laterality: N/A;   MICROLARYNGOSCOPY WITH CO2 LASER AND EXCISION OF VOCAL CORD LESION N/A 11/23/2016   Procedure: MICROLARYNGOSCOPY  AND EXCISION OF VOCAL CORD LESION;  Surgeon: Norleen Notice, MD;  Location: [facility] OR;  Service: ENT;  Laterality: N/A;   MICROLARYNGOSCOPY WITH CO2 LASER AND EXCISION OF VOCAL CORD LESION N/A 01/11/2017   Procedure: MICROLARYNGOSCOPY WITH CO2 LASER AND EXCISION OF VOCAL CORD LESION;  Surgeon: Norleen Notice, MD;  Location: [facility] OR;  Service: ENT;  Laterality: N/A;    MICROLARYNGOSCOPY WITH LASER N/A 03/16/2015   Procedure: MICROLARYNGOSCOPY ;  Surgeon: Norleen Notice, MD;  Location: [facility] OR;  Service: ENT;  Laterality: N/A;   peyronie's surgery     SHOULDER SURGERY Right    rotator cuff   SPINE SURGERY  09/2020   cervical surgery. steel plate, bone spurs, allograft.   THROAT SURGERY  1997   cancer removed    Family History  Problem Relation Age of Onset   Cancer Mother        bone   Hypertension Mother    Stroke Father    Hypertension Father        Stroke   Healthy Child    Cancer Other        brain   Diabetes Other    Social History   Socioeconomic History   Marital status: Married    Spouse name: Not on file   Number of children: 4   Years of education: Not on file   Highest education level: GED or equivalent  Occupational History   Occupation: retired  Tobacco Use   Smoking status: Every Day    Current packs/day: 1.00    Average packs/day: 1 pack/day for 50.0 years (50.0 ttl pk-yrs)    Types: Cigarettes   Smokeless tobacco: Never   Tobacco comments:    down to .5 ppd  11/02/2022 hfb  Vaping Use   Vaping status: Former  Substance and Sexual Activity   Alcohol use: Not on file   Drug use: No   Sexual activity: Not on file  Other Topics Concern   Not on file  Social History Narrative   Lives with wife       One level      Right hand   Social Drivers of Health   Financial Resource Strain: Low Risk  (10/06/2024)   Overall Financial Resource Strain (CARDIA)    Difficulty of Paying Living Expenses: Not hard at all  Food Insecurity: No Food Insecurity (10/06/2024)   Hunger Vital Sign    Worried About Running Out of Food in the Last Year: Never true    Ran Out of Food in the Last Year: Never true  Transportation Needs: No Transportation Needs (10/06/2024)   PRAPARE - Administrator, Civil Service (Medical): No    Lack of Transportation (Non-Medical): No  Physical Activity: Inactive (10/06/2024)   Exercise  Vital Sign    Days of Exercise per Week: 0 days    Minutes of Exercise per Session: Not on file  Stress: No Stress Concern Present (10/06/2024)   [employer] of Occupational Health - Occupational Stress Questionnaire    Feeling of Stress: Not at all  Social Connections: Socially Isolated (10/06/2024)   Social Connection and Isolation Panel    Frequency of Communication with Friends  and Family: Once a week    Frequency of Social Gatherings with Friends and Family: Once a week    Attends Religious Services: Never    Diplomatic Services Operational Officer: No    Attends Engineer, Structural: Not on file    Marital Status: Married    Objective:  BP 102/60   Pulse 81   Temp 98 F (36.7 C) (Temporal)   Resp 20   Ht 5' 10 (1.778 m)   Wt 186 lb (84.4 kg)   SpO2 90%   BMI 26.69 kg/m      10/08/2024    3:14 PM 09/30/2024    2:45 PM 07/30/2024    9:51 AM  BP/Weight  Systolic BP 102 112 118  Diastolic BP 60 54 58  Wt. (Lbs) 186 198 190  BMI 26.69 kg/m2 28.41 kg/m2 27.26 kg/m2    Physical Exam Constitutional:      Appearance: Normal appearance.  HENT:     Right Ear: Tympanic membrane, ear canal and external ear normal.     Left Ear: Tympanic membrane, ear canal and external ear normal.     Nose: Nose normal. No congestion or rhinorrhea.     Mouth/Throat:     Mouth: Mucous membranes are moist.     Pharynx: No oropharyngeal exudate or posterior oropharyngeal erythema.  Cardiovascular:     Rate and Rhythm: Normal rate and regular rhythm.     Heart sounds: Normal heart sounds.  Pulmonary:     Breath sounds: Wheezing present. No rhonchi or rales.     Comments: POOR AIR EXCHANGE Abdominal:     General: Bowel sounds are normal.     Palpations: Abdomen is soft.     Tenderness: There is no abdominal tenderness.  Lymphadenopathy:     Cervical: No cervical adenopathy.  Neurological:     Mental Status: He is alert.  Psychiatric:        Mood and Affect: Mood  normal.        Behavior: Behavior normal.         Lab Results  Component Value Date   WBC 15.5 (H) 07/30/2024   HGB 14.1 07/30/2024   HCT 44.0 07/30/2024   PLT 206 07/30/2024   GLUCOSE 363 (H) 07/30/2024   CHOL 120 02/28/2024   TRIG 517 (H) 02/28/2024   HDL 27 (L) 02/28/2024   LDLCALC 23 02/28/2024   ALT 15 07/30/2024   AST 14 07/30/2024   NA 137 07/30/2024   K 4.0 07/30/2024   CL 95 (L) 07/30/2024   CREATININE 0.97 07/30/2024   BUN 11 07/30/2024   CO2 27 07/30/2024   TSH 1.190 06/03/2024   INR 1.00 11/14/2023   HGBA1C 10.1 07/30/2024    Results for orders placed or performed in visit on 07/30/24  POCT Lipid Panel   Collection Time: 07/30/24 10:17 AM  Result Value Ref Range   TC 124    HDL 36    TRG 290    LDL 30    Non-HDL 88    TC/HDL    POCT glycosylated hemoglobin (Hb A1C)   Collection Time: 07/30/24 10:17 AM  Result Value Ref Range   Hemoglobin A1C     HbA1c POC (<> result, manual entry) 10.1 4.0 - 5.6 %   HbA1c, POC (prediabetic range)     HbA1c, POC (controlled diabetic range)    Microalbumin / creatinine urine ratio   Collection Time: 07/30/24 10:53 AM  Result Value Ref Range  Creatinine, Urine CANCELED mg/dL   Microalbumin, Urine CANCELED   CBC with Differential/Platelet   Collection Time: 07/30/24 10:53 AM  Result Value Ref Range   WBC 15.5 (H) 3.4 - 10.8 x10E3/uL   RBC 4.64 4.14 - 5.80 x10E6/uL   Hemoglobin 14.1 13.0 - 17.7 g/dL   Hematocrit 55.9 62.4 - 51.0 %   MCV 95 79 - 97 fL   MCH 30.4 26.6 - 33.0 pg   MCHC 32.0 31.5 - 35.7 g/dL   RDW 86.1 88.3 - 84.5 %   Platelets 206 150 - 450 x10E3/uL   Neutrophils 84 Not Estab. %   Lymphs 10 Not Estab. %   Monocytes 4 Not Estab. %   Eos 1 Not Estab. %   Basos 0 Not Estab. %   Neutrophils Absolute 13.1 (H) 1.4 - 7.0 x10E3/uL   Lymphocytes Absolute 1.5 0.7 - 3.1 x10E3/uL   Monocytes Absolute 0.6 0.1 - 0.9 x10E3/uL   EOS (ABSOLUTE) 0.1 0.0 - 0.4 x10E3/uL   Basophils Absolute 0.1 0.0 - 0.2  x10E3/uL   Immature Granulocytes 1 Not Estab. %   Immature Grans (Abs) 0.1 0.0 - 0.1 x10E3/uL  Comprehensive metabolic panel with GFR   Collection Time: 07/30/24 10:53 AM  Result Value Ref Range   Glucose 363 (H) 70 - 99 mg/dL   BUN 11 8 - 27 mg/dL   Creatinine, Ser 9.02 0.76 - 1.27 mg/dL   eGFR 82 >40 fO/fpw/8.26   BUN/Creatinine Ratio 11 10 - 24   Sodium 137 134 - 144 mmol/L   Potassium 4.0 3.5 - 5.2 mmol/L   Chloride 95 (L) 96 - 106 mmol/L   CO2 27 20 - 29 mmol/L   Calcium  8.7 8.6 - 10.2 mg/dL   Total Protein 5.6 (L) 6.0 - 8.5 g/dL   Albumin 3.6 (L) 3.8 - 4.8 g/dL   Globulin, Total 2.0 1.5 - 4.5 g/dL   Bilirubin Total 0.5 0.0 - 1.2 mg/dL   Alkaline Phosphatase 69 47 - 123 IU/L   AST 14 0 - 40 IU/L   ALT 15 0 - 44 IU/L  Lactate Dehydrogenase   Collection Time: 07/30/24 10:53 AM  Result Value Ref Range   LDH 187 121 - 224 IU/L   *Note: Due to a large number of results and/or encounters for the requested time period, some results have not been displayed. A complete set of results can be found in Results Review.  .  Assessment & Plan:   Assessment & Plan COPD exacerbation (HCC) Chronic obstructive pulmonary disease (COPD) with acute exacerbation Acute exacerbation with increased cough and thick sputum, especially nocturnal. Symptoms partially responsive to breathing treatments. Oxygen  therapy needed during exacerbations.  Rocephin  and kenalog  given.  Prednisone  and levaquin  prescribed.   Orders:   triamcinolone  acetonide (KENALOG -40) injection 80 mg   cefTRIAXone  (ROCEPHIN ) injection 1 g   predniSONE  (DELTASONE ) 20 MG tablet; Take 3 tablets (60 mg total) by mouth daily with breakfast for 3 days, THEN 2 tablets (40 mg total) daily with breakfast for 3 days, THEN 1 tablet (20 mg total) daily with breakfast for 3 days.   levofloxacin  (LEVAQUIN ) 750 MG tablet; Take 1 tablet (750 mg total) by mouth daily.  Body mass index is 26.69 kg/m.     Meds ordered this encounter   Medications   triamcinolone  acetonide (KENALOG -40) injection 80 mg   cefTRIAXone  (ROCEPHIN ) injection 1 g   predniSONE  (DELTASONE ) 20 MG tablet    Sig: Take 3 tablets (60 mg total) by  mouth daily with breakfast for 3 days, THEN 2 tablets (40 mg total) daily with breakfast for 3 days, THEN 1 tablet (20 mg total) daily with breakfast for 3 days.    Dispense:  18 tablet    Refill:  0   levofloxacin  (LEVAQUIN ) 750 MG tablet    Sig: Take 1 tablet (750 mg total) by mouth daily.    Dispense:  7 tablet    Refill:  0    No orders of the defined types were placed in this encounter.      Follow-up: Return for awv.  An After Visit Summary was printed and given to the patient.  Abigail Free, MD [facility] [phone]

## 2024-10-07 NOTE — Telephone Encounter (Signed)
 Patient has an appointment 11.26.25.

## 2024-10-08 ENCOUNTER — Ambulatory Visit (INDEPENDENT_AMBULATORY_CARE_PROVIDER_SITE_OTHER): Admitting: Family Medicine

## 2024-10-08 ENCOUNTER — Encounter: Payer: Self-pay | Admitting: Family Medicine

## 2024-10-08 VITALS — BP 102/60 | HR 81 | Temp 98.0°F | Resp 20 | Ht 70.0 in | Wt 186.0 lb

## 2024-10-08 DIAGNOSIS — J441 Chronic obstructive pulmonary disease with (acute) exacerbation: Secondary | ICD-10-CM

## 2024-10-08 MED ORDER — CEFTRIAXONE SODIUM 1 G IJ SOLR
1.0000 g | Freq: Once | INTRAMUSCULAR | Status: AC
  Administered 2024-10-08: 1 g via INTRAMUSCULAR

## 2024-10-08 MED ORDER — TRIAMCINOLONE ACETONIDE 40 MG/ML IJ SUSP
80.0000 mg | Freq: Once | INTRAMUSCULAR | Status: AC
  Administered 2024-10-08: 80 mg via INTRAMUSCULAR

## 2024-10-08 MED ORDER — PREDNISONE 20 MG PO TABS: ORAL_TABLET | ORAL | 0 refills | Status: AC

## 2024-10-08 MED ORDER — LEVOFLOXACIN 750 MG PO TABS: 750.0000 mg | ORAL_TABLET | Freq: Every day | ORAL | 0 refills | Status: DC

## 2024-10-08 NOTE — Patient Instructions (Signed)
  VISIT SUMMARY: You visited us  today due to respiratory symptoms and difficulty breathing. You have been experiencing a productive cough, especially at night, and shortness of breath with exertion. Additionally, you have significant lower back pain associated with your coughing.  YOUR PLAN: CHRONIC OBSTRUCTIVE PULMONARY DISEASE (COPD) WITH ACUTE EXACERBATION: You are experiencing an acute exacerbation of your COPD, with increased coughing and thick sputum, especially at night. -Continue using your breathing treatments as they are partially effective. -Oxygen  therapy will be needed during exacerbations. -Given Kenalog , Rocephin , Prednisone , and Levaquin .   CHRONIC LOW BACK PAIN: Your lower back pain is being worsened by your coughing. -Continue managing your pain with your current pain medication.                      Contains text generated by Abridge.                                 Contains text generated by Abridge.

## 2024-10-08 NOTE — Assessment & Plan Note (Addendum)
 Chronic obstructive pulmonary disease (COPD) with acute exacerbation Acute exacerbation with increased cough and thick sputum, especially nocturnal. Symptoms partially responsive to breathing treatments. Oxygen  therapy needed during exacerbations.  Rocephin  and kenalog  given.  Prednisone  and levaquin  prescribed.   Orders:   triamcinolone  acetonide (KENALOG -40) injection 80 mg   cefTRIAXone  (ROCEPHIN ) injection 1 g   predniSONE  (DELTASONE ) 20 MG tablet; Take 3 tablets (60 mg total) by mouth daily with breakfast for 3 days, THEN 2 tablets (40 mg total) daily with breakfast for 3 days, THEN 1 tablet (20 mg total) daily with breakfast for 3 days.   levofloxacin  (LEVAQUIN ) 750 MG tablet; Take 1 tablet (750 mg total) by mouth daily.

## 2024-10-11 ENCOUNTER — Other Ambulatory Visit: Payer: Self-pay | Admitting: Cardiology

## 2024-10-13 ENCOUNTER — Encounter: Payer: Self-pay | Admitting: Family Medicine

## 2024-10-14 ENCOUNTER — Other Ambulatory Visit: Payer: Self-pay

## 2024-10-14 MED ORDER — TOUJEO SOLOSTAR 300 UNIT/ML ~~LOC~~ SOPN: 40.0000 [IU] | PEN_INJECTOR | Freq: Every day | SUBCUTANEOUS | 0 refills | Status: DC

## 2024-10-20 ENCOUNTER — Encounter: Payer: Self-pay | Admitting: Internal Medicine

## 2024-10-20 ENCOUNTER — Other Ambulatory Visit: Payer: Self-pay | Admitting: Adult Health

## 2024-10-20 ENCOUNTER — Other Ambulatory Visit: Payer: Self-pay | Admitting: Family Medicine

## 2024-10-20 DIAGNOSIS — J418 Mixed simple and mucopurulent chronic bronchitis: Secondary | ICD-10-CM

## 2024-10-21 ENCOUNTER — Encounter: Payer: Self-pay | Admitting: Family Medicine

## 2024-10-22 ENCOUNTER — Other Ambulatory Visit: Payer: Self-pay

## 2024-10-22 MED ORDER — DOXYCYCLINE HYCLATE 100 MG PO TABS: 100.0000 mg | ORAL_TABLET | Freq: Two times a day (BID) | ORAL | 0 refills | Status: DC

## 2024-10-27 ENCOUNTER — Other Ambulatory Visit: Payer: Self-pay

## 2024-10-27 ENCOUNTER — Encounter: Payer: Self-pay | Admitting: Internal Medicine

## 2024-10-27 DIAGNOSIS — J449 Chronic obstructive pulmonary disease, unspecified: Secondary | ICD-10-CM

## 2024-10-27 DIAGNOSIS — J441 Chronic obstructive pulmonary disease with (acute) exacerbation: Secondary | ICD-10-CM

## 2024-10-27 MED ORDER — DUPIXENT 300 MG/2ML ~~LOC~~ SOAJ: 300.0000 mg | SUBCUTANEOUS | 1 refills | Status: DC

## 2024-10-27 NOTE — Progress Notes (Signed)
 Refill request for DUPIXENT  received from [facility]: [phone]  Of note, pharmacy has now changed to [facility].  Dose: 300mg  [place] every 14 days   Last OV: 03/26/24 Provider: Dr. Geronimo  Next OV: overdue - due Aug 2025  Routing to scheduling team for follow-up on appt scheduling  Aleck Puls, PharmD, BCPS Clinical Pharmacist  [facility]

## 2024-10-30 ENCOUNTER — Other Ambulatory Visit: Payer: Self-pay | Admitting: Family Medicine

## 2024-10-30 ENCOUNTER — Ambulatory Visit: Admitting: Physician Assistant

## 2024-11-02 ENCOUNTER — Other Ambulatory Visit: Payer: Self-pay | Admitting: Family Medicine

## 2024-11-03 ENCOUNTER — Ambulatory Visit: Admitting: Gastroenterology

## 2024-11-03 NOTE — Progress Notes (Deleted)
 "  Subjective:  Patient ID: Don [name], male    DOB: 01-13-1951  Age: 73 y.o. MRN: [id number]  No chief complaint on file.   HPI: Discussed the use of AI scribe software for clinical note transcription with the patient, who gave verbal consent to proceed.  History of Present Illness        07/30/2024   10:00 AM 02/28/2024    9:44 AM 11/29/2023    9:38 AM 05/31/2023   10:38 AM 04/03/2023    4:09 PM  Depression screen PHQ 2/9  Decreased Interest 0 1 2 2  0  Down, Depressed, Hopeless 0 0 2 0 0  PHQ - 2 Score 0 1 4 2  0  Altered sleeping 0 0 2 2   Tired, decreased energy 0 1 2 0   Change in appetite 0 0 0 2   Feeling bad or failure about yourself  0 0 0 0   Trouble concentrating 0 0 1 1   Moving slowly or fidgety/restless 0 0 0 0   Suicidal thoughts 0 0 0 0   PHQ-9 Score 0  2  9  7     Difficult doing work/chores Not difficult at all Not difficult at all Somewhat difficult Very difficult      Data saved with a previous flowsheet row definition        07/30/2024   10:00 AM  Fall Risk   Falls in the past year? 1  Number falls in past yr: 0  Injury with Fall? 0   Risk for fall due to : No Fall Risks  Follow up Falls evaluation completed     Data saved with a previous flowsheet row definition    Patient Care Team: CoxAbigail, MD as PCP - General (Family Medicine) Monetta Redell PARAS, MD as Referring Physician (Cardiology) Roark Rush, MD as Consulting Physician (Otolaryngology)   Review of Systems  Medications Ordered Prior to Encounter[1] Past Medical History:  Diagnosis Date   Abdominal pain 06/03/2024   Abnormal chest CT 02/01/2024   Absence of bladder continence 01/08/2022   Acquired hypothyroidism 03/02/2024   Acute infection of nasal sinus 01/13/2023   Acute non-recurrent maxillary sinusitis 01/13/2023   Allergy 1989   Anxiety state 02/02/2022   Arthritis    Asymptomatic LV dysfunction 10/18/2017   Atherosclerosis of aorta 01/13/2023   B12 deficiency  anemia 08/25/2021   Balance problem 07/01/2024   Basal cell carcinoma    Benign prostatic hyperplasia with incomplete bladder emptying 01/14/2019   Blood in ear canal, right 06/03/2024   Cancer (HCC) 1997   throat - 1997, throat - 2018   Cardiomyopathy, secondary (HCC) 12/01/2016   Overview:  EF 47% 12/26/16   CHF (congestive heart failure) (HCC) 10/2002   Chronic coronary artery disease 10/18/2017   Chronic cough 12/16/2023   Chronic ischemic heart disease 11/13/1998   Jan 22, 2003 Entered By: CHRISTOPHER OLIVE J Comment:  massive Mi in 2000 per patient hsitory, 2nd Mi in 2001Mar 11, 2004 Entered By: CHRISTOPHER OLIVE J Comment: Had stent placedin 2000,cath/angio in 2001   Chronic kidney disease    Chronic neck pain 05/16/2022   Chronic pain syndrome 06/04/2022   Chronic respiratory failure with hypoxia (HCC) 05/16/2022   COPD (chronic obstructive pulmonary disease) (HCC)    COPD exacerbation (HCC) 07/30/2023   Coronary artery disease involving native coronary artery of native heart with angina pectoris 12/01/2016   Overview:  He has hx of IWMI in remote past, last cath in 2012  at [facility] showed chronic total occlusion of previously stented RCA, with good collaterals, a 40% LAD stenosis, inferior hypokinesis and EF 45%.    He's been lost to Cardiology f/u since 2015, but has not had any recurrent events   Deficiency anemia 08/25/2021   Diabetic polyneuropathy associated with type 2 diabetes mellitus (HCC) 05/16/2022   Dyslipidemia 12/01/2016   Dysuria 04/29/2024   Emphysema of lung (HCC)    Encounter for Medicare annual wellness exam 01/13/2023   Erectile dysfunction due to diseases classified elsewhere 06/12/2019   GAD (generalized anxiety disorder) 02/02/2022   GERD (gastroesophageal reflux disease)    Hallucination 10/08/2022   History of recent fall 06/03/2024   Hyperlipidemia    Hypertension    Hypertensive heart disease with heart failure (HCC)    Insomnia 01/28/2021   Intercostal pain  04/29/2024   Iron  deficiency 07/07/2023   Iron  deficiency anemia due to chronic blood loss 08/25/2021   Lesion of vocal cord    Leukoplakia of larynx 11/15/2016   Long term (current) use of systemic steroids 12/16/2023   Long-term use of aspirin  therapy 10/02/2018   Lumbar back pain 06/04/2022   Lung nodule 11/30/2023   Lung nodules 08/17/2022   Macular degeneration of both eyes 03/02/2024   Malfunction of penile prosthesis 01/14/2019   Malignant neoplasm of skin 01/28/2021   Formatting of this note might be different from the original. Jan 22, 2003 Entered By: CHRISTOPHER OLIVE J Comment: of skin - removed x2 inpast Jan 22, 2003 Entered By: CHRISTOPHER OLIVE J Comment: of skin - removed x2 inpast   Melanoma of back (HCC)    melanoma on back   Memory loss 03/08/2020   Mixed simple and mucopurulent chronic bronchitis (HCC) 03/02/2024   Moderate recurrent major depression (HCC) 03/02/2023   Mucopurulent chronic bronchitis (HCC) 04/08/2023   Myocardial infarction (HCC) 2000   Oropharyngeal dysphagia 08/12/2018   Other ill-defined and unknown causes of morbidity and mortality 11/13/1993   Formatting of this note might be different from the original. Jan 31, 2008 Entered By: ARMOUR,ROSS B Comment: lumbar, 8/08 Jan 22, 2003 Entered By: CHRISTOPHER OLIVE J Comment: x40yrs in work place  quit 92yrs ago in 2000 Jan 22, 2003 Entered By: CHRISTOPHER OLIVE J Comment: x81yrs in work place  quit 14yrs ago in 2000 Jan 31, 2008 Entered By: MEMORY GULL B Comment: lumbar, 8/08   Other microscopic hematuria 04/29/2024   Oxygen  deficiency    PAD (peripheral artery disease) 10/18/2017   Palpitations 04/29/2024   Peripheral vascular disease    iliac artery clot   Peyronie's disease 06/12/2019   PVC (premature ventricular contraction) 10/09/2023   RBBB 10/09/2023   Sleep apnea    Steroid myopathy 02/01/2024   Stroke (HCC)    Tia they found on MRI   Tobacco use disorder 06/12/2019   Type 2 diabetes mellitus with  hyperglycemia, with long-term current use of insulin  (HCC) 07/30/2023   URI, acute 05/31/2023   Urinary urgency 04/08/2023   Ventricular ectopy 11/09/2022   Vertigo 04/08/2023   Vitamin D  deficiency disease 01/13/2023   Past Surgical History:  Procedure Laterality Date   ABDOMINAL ANGIOGRAM N/A 01/13/2015   Procedure: ABDOMINAL ANGIOGRAM;  Surgeon: Krystal JULIANNA Doing, MD;  Location: [facility] CATH LAB;  Service: Cardiovascular;  Laterality: N/A;   ABDOMINAL AORTOGRAM W/LOWER EXTREMITY N/A 05/27/2024   Procedure: ABDOMINAL AORTOGRAM W/LOWER EXTREMITY;  Surgeon: Serene Gaile ORN, MD;  Location: [facility] INVASIVE CV LAB;  Service: Cardiovascular;  Laterality: N/A;   APPENDECTOMY     BACK  SURGERY     CARDIAC CATHETERIZATION  2000/2012   with stents in 2000   CHOLECYSTECTOMY     COLONOSCOPY     ELBOW SURGERY     bilateral   ESOPHAGOGASTRODUODENOSCOPY     KNEE SURGERY Left    LOWER EXTREMITY ANGIOGRAPHY N/A 05/27/2024   Procedure: Lower Extremity Angiography;  Surgeon: Serene Gaile ORN, MD;  Location: [facility] INVASIVE CV LAB;  Service: Cardiovascular;  Laterality: N/A;   LOWER EXTREMITY INTERVENTION N/A 05/27/2024   Procedure: LOWER EXTREMITY INTERVENTION;  Surgeon: Serene Gaile ORN, MD;  Location: [facility] INVASIVE CV LAB;  Service: Cardiovascular;  Laterality: N/A;   MICROLARYNGOSCOPY WITH CO2 LASER AND EXCISION OF VOCAL CORD LESION N/A 11/23/2016   Procedure: MICROLARYNGOSCOPY  AND EXCISION OF VOCAL CORD LESION;  Surgeon: Norleen Notice, MD;  Location: [facility] OR;  Service: ENT;  Laterality: N/A;   MICROLARYNGOSCOPY WITH CO2 LASER AND EXCISION OF VOCAL CORD LESION N/A 01/11/2017   Procedure: MICROLARYNGOSCOPY WITH CO2 LASER AND EXCISION OF VOCAL CORD LESION;  Surgeon: Norleen Notice, MD;  Location: [facility] OR;  Service: ENT;  Laterality: N/A;   MICROLARYNGOSCOPY WITH LASER N/A 03/16/2015   Procedure: MICROLARYNGOSCOPY ;  Surgeon: Norleen Notice, MD;  Location: [facility] OR;  Service: ENT;  Laterality: N/A;   peyronie's surgery     SHOULDER  SURGERY Right    rotator cuff   SPINE SURGERY  09/2020   cervical surgery. steel plate, bone spurs, allograft.   THROAT SURGERY  1997   cancer removed    Family History  Problem Relation Age of Onset   Cancer Mother        bone   Hypertension Mother    Stroke Father    Hypertension Father        Stroke   Healthy Child    Cancer Other        brain   Diabetes Other    Social History   Socioeconomic History   Marital status: Married    Spouse name: Not on file   Number of children: 4   Years of education: Not on file   Highest education level: GED or equivalent  Occupational History   Occupation: retired  Tobacco Use   Smoking status: Every Day    Current packs/day: 1.00    Average packs/day: 1 pack/day for 50.0 years (50.0 ttl pk-yrs)    Types: Cigarettes   Smokeless tobacco: Never   Tobacco comments:    down to .5 ppd  11/02/2022 hfb  Vaping Use   Vaping status: Former  Substance and Sexual Activity   Alcohol use: Not on file   Drug use: No   Sexual activity: Not on file  Other Topics Concern   Not on file  Social History Narrative   Lives with wife       One level      Right hand   Social Drivers of Health   Tobacco Use: High Risk (10/13/2024)   Patient History    Smoking Tobacco Use: Every Day    Smokeless Tobacco Use: Never    Passive Exposure: Not on file  Financial Resource Strain: Low Risk (10/06/2024)   Overall Financial Resource Strain (CARDIA)    Difficulty of Paying Living Expenses: Not hard at all  Food Insecurity: No Food Insecurity (10/06/2024)   Epic    Worried About Programme Researcher, Broadcasting/film/video in the Last Year: Never true    Ran Out of Food in the Last Year: Never true  Transportation Needs: No  Transportation Needs (10/06/2024)   Epic    Lack of Transportation (Medical): No    Lack of Transportation (Non-Medical): No  Physical Activity: Inactive (10/06/2024)   Exercise Vital Sign    Days of Exercise per Week: 0 days    Minutes of Exercise  per Session: Not on file  Stress: No Stress Concern Present (10/06/2024)   [employer] of Occupational Health - Occupational Stress Questionnaire    Feeling of Stress: Not at all  Social Connections: Socially Isolated (10/06/2024)   Social Connection and Isolation Panel    Frequency of Communication with Friends and Family: Once a week    Frequency of Social Gatherings with Friends and Family: Once a week    Attends Religious Services: Never    Database Administrator or Organizations: No    Attends Engineer, Structural: Not on file    Marital Status: Married  Depression (PHQ2-9): Low Risk (07/30/2024)   Depression (PHQ2-9)    PHQ-2 Score: 0  Alcohol Screen: Low Risk (10/22/2023)   Alcohol Screen    Last Alcohol Screening Score (AUDIT): 0  Housing: Unknown (10/06/2024)   Epic    Unable to Pay for Housing in the Last Year: No    Number of Times Moved in the Last Year: Not on file    Homeless in the Last Year: No  Utilities: Not At Risk (07/30/2024)   Epic    Threatened with loss of utilities: No  Health Literacy: Adequate Health Literacy (05/31/2023)   B1300 Health Literacy    Frequency of need for help with medical instructions: Never    Objective:  There were no vitals taken for this visit.     10/08/2024    3:14 PM 09/30/2024    2:45 PM 07/30/2024    9:51 AM  BP/Weight  Systolic BP 102 112 118  Diastolic BP 60 54 58  Wt. (Lbs) 186 198 190  BMI 26.69 kg/m2 28.41 kg/m2 27.26 kg/m2    Physical Exam  {Perform Simple Foot Exam  Perform Detailed exam:1} {Insert foot Exam (Optional):30965}   Lab Results  Component Value Date   WBC 15.5 (H) 07/30/2024   HGB 14.1 07/30/2024   HCT 44.0 07/30/2024   PLT 206 07/30/2024   GLUCOSE 363 (H) 07/30/2024   CHOL 120 02/28/2024   TRIG 517 (H) 02/28/2024   HDL 27 (L) 02/28/2024   LDLCALC 23 02/28/2024   ALT 15 07/30/2024   AST 14 07/30/2024   NA 137 07/30/2024   K 4.0 07/30/2024   CL 95 (L) 07/30/2024    CREATININE 0.97 07/30/2024   BUN 11 07/30/2024   CO2 27 07/30/2024   TSH 1.190 06/03/2024   INR 1.00 11/14/2023   HGBA1C 10.1 07/30/2024    Results for orders placed or performed in visit on 07/30/24  POCT Lipid Panel   Collection Time: 07/30/24 10:17 AM  Result Value Ref Range   TC 124    HDL 36    TRG 290    LDL 30    Non-HDL 88    TC/HDL    POCT glycosylated hemoglobin (Hb A1C)   Collection Time: 07/30/24 10:17 AM  Result Value Ref Range   Hemoglobin A1C     HbA1c POC (<> result, manual entry) 10.1 4.0 - 5.6 %   HbA1c, POC (prediabetic range)     HbA1c, POC (controlled diabetic range)    Microalbumin / creatinine urine ratio   Collection Time: 07/30/24 10:53 AM  Result Value Ref  Range   Creatinine, Urine CANCELED mg/dL   Microalbumin, Urine CANCELED   CBC with Differential/Platelet   Collection Time: 07/30/24 10:53 AM  Result Value Ref Range   WBC 15.5 (H) 3.4 - 10.8 x10E3/uL   RBC 4.64 4.14 - 5.80 x10E6/uL   Hemoglobin 14.1 13.0 - 17.7 g/dL   Hematocrit 55.9 62.4 - 51.0 %   MCV 95 79 - 97 fL   MCH 30.4 26.6 - 33.0 pg   MCHC 32.0 31.5 - 35.7 g/dL   RDW 86.1 88.3 - 84.5 %   Platelets 206 150 - 450 x10E3/uL   Neutrophils 84 Not Estab. %   Lymphs 10 Not Estab. %   Monocytes 4 Not Estab. %   Eos 1 Not Estab. %   Basos 0 Not Estab. %   Neutrophils Absolute 13.1 (H) 1.4 - 7.0 x10E3/uL   Lymphocytes Absolute 1.5 0.7 - 3.1 x10E3/uL   Monocytes Absolute 0.6 0.1 - 0.9 x10E3/uL   EOS (ABSOLUTE) 0.1 0.0 - 0.4 x10E3/uL   Basophils Absolute 0.1 0.0 - 0.2 x10E3/uL   Immature Granulocytes 1 Not Estab. %   Immature Grans (Abs) 0.1 0.0 - 0.1 x10E3/uL  Comprehensive metabolic panel with GFR   Collection Time: 07/30/24 10:53 AM  Result Value Ref Range   Glucose 363 (H) 70 - 99 mg/dL   BUN 11 8 - 27 mg/dL   Creatinine, Ser 9.02 0.76 - 1.27 mg/dL   eGFR 82 >40 fO/fpw/8.26   BUN/Creatinine Ratio 11 10 - 24   Sodium 137 134 - 144 mmol/L   Potassium 4.0 3.5 - 5.2 mmol/L    Chloride 95 (L) 96 - 106 mmol/L   CO2 27 20 - 29 mmol/L   Calcium  8.7 8.6 - 10.2 mg/dL   Total Protein 5.6 (L) 6.0 - 8.5 g/dL   Albumin 3.6 (L) 3.8 - 4.8 g/dL   Globulin, Total 2.0 1.5 - 4.5 g/dL   Bilirubin Total 0.5 0.0 - 1.2 mg/dL   Alkaline Phosphatase 69 47 - 123 IU/L   AST 14 0 - 40 IU/L   ALT 15 0 - 44 IU/L  Lactate Dehydrogenase   Collection Time: 07/30/24 10:53 AM  Result Value Ref Range   LDH 187 121 - 224 IU/L   *Note: Due to a large number of results and/or encounters for the requested time period, some results have not been displayed. A complete set of results can be found in Results Review.  .  Assessment & Plan:   Assessment & Plan Mixed simple and mucopurulent chronic bronchitis (HCC)     Diabetic polyneuropathy associated with type 2 diabetes mellitus (HCC)     Mixed hyperlipidemia     Chronic pain syndrome       There is no height or weight on file to calculate BMI.  Assessment and Plan Assessment & Plan      No orders of the defined types were placed in this encounter.   No orders of the defined types were placed in this encounter.      Follow-up: No follow-ups on file.  An After Visit Summary was printed and given to the patient.  Abigail Free, MD [facility] [phone]    [1]  Current Outpatient Medications on File Prior to Visit  Medication Sig Dispense Refill   doxycycline  (VIBRA -TABS) 100 MG tablet Take 1 tablet (100 mg total) by mouth 2 (two) times daily. 20 tablet 0   ALPRAZolam  (XANAX ) 0.25 MG tablet Take 1 tablet by mouth  twice daily as needed for anxiety 60 tablet 0   arformoterol  (BROVANA ) 15 MCG/2ML NEBU Take 2 mLs (15 mcg total) by nebulization 2 (two) times daily. 120 mL 6   aspirin  EC 81 MG tablet Take 1 tablet (81 mg total) by mouth daily. Swallow whole. 150 tablet 2   budesonide  (PULMICORT ) 0.25 MG/2ML nebulizer solution Take 2 mLs (0.25 mg total) by nebulization in the morning and at bedtime. 60 mL  11   busPIRone  (BUSPAR ) 7.5 MG tablet Take 1 tablet by mouth twice daily 60 tablet 5   clopidogrel  (PLAVIX ) 75 MG tablet Take 1 tablet by mouth once daily 90 tablet 3   Continuous Glucose Sensor (DEXCOM G7 SENSOR) MISC Every 10 days 9 each 3   cyanocobalamin  (VITAMIN B12) 1000 MCG/ML injection INJECT 1 ML (CC) INTRAMUSCULARLY ONCE A WEEK 10 mL 0   cyclobenzaprine  (FLEXERIL ) 10 MG tablet Take 1 tablet by mouth three times daily as needed for muscle spasm 90 tablet 3   dapagliflozin  propanediol (FARXIGA ) 10 MG TABS tablet Take 1 tablet (10 mg total) by mouth daily. 90 tablet 0   doxycycline  (VIBRA -TABS) 100 MG tablet Take 1 tablet (100 mg total) by mouth 2 (two) times daily. 20 tablet 0   DULoxetine  (CYMBALTA ) 60 MG capsule Take 1 capsule by mouth twice daily 60 capsule 5   Dupilumab  (DUPIXENT ) 300 MG/2ML SOAJ Inject 300 mg into the skin every 14 (fourteen) days. ** PLEASE SCHEDULE FOLLOW-UP FOR FUTURE REFILLS ** 4 mL 1   finasteride  (PROSCAR ) 5 MG tablet Take 1 tablet by mouth once daily 90 tablet 0   gabapentin  (NEURONTIN ) 400 MG capsule TAKE 1 CAPSULE BY MOUTH THREE TIMES DAILY 270 capsule 0   insulin  lispro (HUMALOG ) 100 UNIT/ML injection Inject 0.04 mLs (4 Units total) into the skin 3 (three) times daily with meals. (Patient taking differently: Inject 5 Units into the skin 3 (three) times daily as needed for high blood sugar (higher than 400 give 5 units).) 10 mL 11   Insulin  Pen Needle (PEN NEEDLES) 31G X 8 MM MISC Inject insulin  as directed 100 each 1   ipratropium-albuterol  (DUONEB) 0.5-2.5 (3) MG/3ML SOLN USE 1 AMPULE IN NEBULIZER 4 TIMES DAILY 360 mL 0   Lancets (ONETOUCH DELICA PLUS LANCET33G) MISC USE 1  TO CHECK GLUCOSE THREE TIMES DAILY (Patient taking differently: 1 each by Other route 3 (three) times daily.) 100 each 0   levofloxacin  (LEVAQUIN ) 750 MG tablet Take 1 tablet (750 mg total) by mouth daily. 7 tablet 0   levothyroxine  (SYNTHROID ) 25 MCG tablet Take 1 tablet by mouth once  daily 90 tablet 0   Magnesium  Chloride (MAG64) 64 MG TBEC Take 1 tablet (64 mg total) by mouth daily. 90 tablet 1   metoprolol  succinate (TOPROL -XL) 100 MG 24 hr tablet Take 1 tablet (100 mg total) by mouth daily. 90 tablet 3   nitroGLYCERIN  (NITROSTAT ) 0.4 MG SL tablet Place 1 tablet (0.4 mg total) under the tongue every 5 (five) minutes as needed for chest pain. DISSOLVE ONE TABLET UNDER THE TONGUE EVERY 5 TO 10 MINUTES PRIOR TO ACTIVITIES WHICH MIGHT PRECIPITATE AN ATTACK 25 tablet 0   omega-3 acid ethyl esters (LOVAZA ) 1 g capsule Take 2 capsules (2 g total) by mouth 2 (two) times daily. 360 capsule 1   omeprazole  (PRILOSEC) 20 MG capsule Take 20 mg by mouth daily.     ONETOUCH ULTRA test strip USE 1 STRIP TO CHECK GLUCOSE IN THE MORNING AND 1 AT BEDTIME 100  each 0   oxyCODONE -acetaminophen  (PERCOCET/ROXICET) 5-325 MG tablet Take 1 tablet by mouth 2 (two) times daily as needed for severe pain (pain score 7-10). 60 tablet 0   [START ON 11/04/2024] oxyCODONE -acetaminophen  (PERCOCET/ROXICET) 5-325 MG tablet Take 1 tablet by mouth 2 (two) times daily as needed for severe pain (pain score 7-10). 60 tablet 0   pantoprazole  (PROTONIX ) 40 MG tablet Take 1 tablet by mouth once daily 90 tablet 0   predniSONE  (DELTASONE ) 10 MG tablet Take 1 tablet by mouth once daily with breakfast 30 tablet 5   RELION INSULIN  SYRINGE 31G X 15/64 0.3 ML MISC AS DIRECTED 100 each 0   revefenacin  (YUPELRI ) 175 MCG/3ML nebulizer solution Take 3 mLs (175 mcg total) by nebulization daily. 90 mL 11   rosuvastatin  (CRESTOR ) 20 MG tablet Take 1 tablet (20 mg total) by mouth daily. 90 tablet 3   sacubitril -valsartan  (ENTRESTO ) 24-26 MG Take 1 tablet by mouth 2 (two) times daily. Take this medication once daily for 1 week, then if Blood pressure is > 100 increase to two times daily. 180 tablet 3   spironolactone  (ALDACTONE ) 25 MG tablet Take 1 tablet by mouth once daily 90 tablet 1   tamsulosin  (FLOMAX ) 0.4 MG CAPS capsule TAKE 2  CAPSULES BY MOUTH ONCE DAILY AFTER SUPPER 180 capsule 0   torsemide  (DEMADEX ) 20 MG tablet Take 1 tablet (20 mg total) by mouth daily as needed (fluid or edema). 90 tablet 3   TOUJEO  SOLOSTAR 300 UNIT/ML Solostar Pen Inject 40 Units into the skin daily. Inject 40 units daily, increase by 2 units every 3 days until sugars are constantly 140 or lower. Max dose of 60 units daily. 18 mL 0   traZODone  (DESYREL ) 150 MG tablet Take 1 tablet by mouth once daily 270 tablet 0   VENTOLIN  HFA 108 (90 Base) MCG/ACT inhaler INHALE 2 PUFFS BY MOUTH EVERY 6 HOURS AS NEEDED FOR SHORTNESS OF BREATH FOR WHEEZING (Patient taking differently: Inhale 2 puffs into the lungs every 6 (six) hours as needed for shortness of breath or wheezing.) 18 g 0   Vitamin D , Ergocalciferol , (DRISDOL ) 1.25 MG (50000 UNIT) CAPS capsule TAKE 1 CAPSULE BY MOUTH TWICE A WEEK 12 capsule 0   Current Facility-Administered Medications on File Prior to Visit  Medication Dose Route Frequency Provider Last Rate Last Admin   ipratropium-albuterol  (DUONEB) 0.5-2.5 (3) MG/3ML nebulizer solution 3 mL  3 mL Nebulization Once        "

## 2024-11-03 NOTE — Assessment & Plan Note (Deleted)
 SABRA

## 2024-11-03 NOTE — Progress Notes (Deleted)
 "  Chief Complaint: Primary GI MD:  HPI:  *** is a  ***  who was referred to me by Sherre Clapper, MD for a complaint of *** .     Discussed the use of AI scribe software for clinical note transcription with the patient, who gave verbal consent to proceed.  History of Present Illness      PREVIOUS GI WORKUP     Past Medical History:  Diagnosis Date   Abdominal pain 06/03/2024   Abnormal chest CT 02/01/2024   Absence of bladder continence 01/08/2022   Acquired hypothyroidism 03/02/2024   Acute infection of nasal sinus 01/13/2023   Acute non-recurrent maxillary sinusitis 01/13/2023   Allergy 1989   Anxiety state 02/02/2022   Arthritis    Asymptomatic LV dysfunction 10/18/2017   Atherosclerosis of aorta 01/13/2023   B12 deficiency anemia 08/25/2021   Balance problem 07/01/2024   Basal cell carcinoma    Benign prostatic hyperplasia with incomplete bladder emptying 01/14/2019   Blood in ear canal, right 06/03/2024   Cancer (HCC) 1997   throat - 1997, throat - 2018   Cardiomyopathy, secondary (HCC) 12/01/2016   Overview:  EF 47% 12/26/16   CHF (congestive heart failure) (HCC) 10/2002   Chronic coronary artery disease 10/18/2017   Chronic cough 12/16/2023   Chronic ischemic heart disease 11/13/1998   Jan 22, 2003 Entered By: CHRISTOPHER OLIVE J Comment:  massive Mi in 2000 per patient hsitory, 2nd Mi in 2001Mar 11, 2004 Entered By: CHRISTOPHER OLIVE J Comment: Had stent placedin 2000,cath/angio in 2001   Chronic kidney disease    Chronic neck pain 05/16/2022   Chronic pain syndrome 06/04/2022   Chronic respiratory failure with hypoxia (HCC) 05/16/2022   COPD (chronic obstructive pulmonary disease) (HCC)    COPD exacerbation (HCC) 07/30/2023   Coronary artery disease involving native coronary artery of native heart with angina pectoris 12/01/2016   Overview:  He has hx of IWMI in remote past, last cath in 2012 at [facility] showed chronic total occlusion of previously stented RCA, with  good collaterals, a 40% LAD stenosis, inferior hypokinesis and EF 45%.    He's been lost to Cardiology f/u since 2015, but has not had any recurrent events   Deficiency anemia 08/25/2021   Diabetic polyneuropathy associated with type 2 diabetes mellitus (HCC) 05/16/2022   Dyslipidemia 12/01/2016   Dysuria 04/29/2024   Emphysema of lung (HCC)    Encounter for Medicare annual wellness exam 01/13/2023   Erectile dysfunction due to diseases classified elsewhere 06/12/2019   GAD (generalized anxiety disorder) 02/02/2022   GERD (gastroesophageal reflux disease)    Hallucination 10/08/2022   History of recent fall 06/03/2024   Hyperlipidemia    Hypertension    Hypertensive heart disease with heart failure (HCC)    Insomnia 01/28/2021   Intercostal pain 04/29/2024   Iron  deficiency 07/07/2023   Iron  deficiency anemia due to chronic blood loss 08/25/2021   Lesion of vocal cord    Leukoplakia of larynx 11/15/2016   Long term (current) use of systemic steroids 12/16/2023   Long-term use of aspirin  therapy 10/02/2018   Lumbar back pain 06/04/2022   Lung nodule 11/30/2023   Lung nodules 08/17/2022   Macular degeneration of both eyes 03/02/2024   Malfunction of penile prosthesis 01/14/2019   Malignant neoplasm of skin 01/28/2021   Formatting of this note might be different from the original. Jan 22, 2003 Entered By: CHRISTOPHER OLIVE J Comment: of skin - removed x2 inpast Jan 22, 2003 Entered By: MANI,ALEYAMMA J  Comment: of skin - removed x2 inpast   Melanoma of back (HCC)    melanoma on back   Memory loss 03/08/2020   Mixed simple and mucopurulent chronic bronchitis (HCC) 03/02/2024   Moderate recurrent major depression (HCC) 03/02/2023   Mucopurulent chronic bronchitis (HCC) 04/08/2023   Myocardial infarction (HCC) 2000   Oropharyngeal dysphagia 08/12/2018   Other ill-defined and unknown causes of morbidity and mortality 11/13/1993   Formatting of this note might be different from the  original. Jan 31, 2008 Entered By: ARMOUR,ROSS B Comment: lumbar, 8/08 Jan 22, 2003 Entered By: CHRISTOPHER OLIVE J Comment: x56yrs in work place  quit 39yrs ago in 2000 Jan 22, 2003 Entered By: CHRISTOPHER OLIVE J Comment: x71yrs in work place  quit 15yrs ago in 2000 Jan 31, 2008 Entered By: MEMORY GULL B Comment: lumbar, 8/08   Other microscopic hematuria 04/29/2024   Oxygen  deficiency    PAD (peripheral artery disease) 10/18/2017   Palpitations 04/29/2024   Peripheral vascular disease    iliac artery clot   Peyronie's disease 06/12/2019   PVC (premature ventricular contraction) 10/09/2023   RBBB 10/09/2023   Sleep apnea    Steroid myopathy 02/01/2024   Stroke (HCC)    Tia they found on MRI   Tobacco use disorder 06/12/2019   Type 2 diabetes mellitus with hyperglycemia, with long-term current use of insulin  (HCC) 07/30/2023   URI, acute 05/31/2023   Urinary urgency 04/08/2023   Ventricular ectopy 11/09/2022   Vertigo 04/08/2023   Vitamin D  deficiency disease 01/13/2023    Past Surgical History:  Procedure Laterality Date   ABDOMINAL ANGIOGRAM N/A 01/13/2015   Procedure: ABDOMINAL ANGIOGRAM;  Surgeon: Krystal JULIANNA Doing, MD;  Location: [facility] CATH LAB;  Service: Cardiovascular;  Laterality: N/A;   ABDOMINAL AORTOGRAM W/LOWER EXTREMITY N/A 05/27/2024   Procedure: ABDOMINAL AORTOGRAM W/LOWER EXTREMITY;  Surgeon: Serene Gaile ORN, MD;  Location: [facility] INVASIVE CV LAB;  Service: Cardiovascular;  Laterality: N/A;   APPENDECTOMY     BACK SURGERY     CARDIAC CATHETERIZATION  2000/2012   with stents in 2000   CHOLECYSTECTOMY     COLONOSCOPY     ELBOW SURGERY     bilateral   ESOPHAGOGASTRODUODENOSCOPY     KNEE SURGERY Left    LOWER EXTREMITY ANGIOGRAPHY N/A 05/27/2024   Procedure: Lower Extremity Angiography;  Surgeon: Serene Gaile ORN, MD;  Location: [facility] INVASIVE CV LAB;  Service: Cardiovascular;  Laterality: N/A;   LOWER EXTREMITY INTERVENTION N/A 05/27/2024   Procedure: LOWER EXTREMITY INTERVENTION;   Surgeon: Serene Gaile ORN, MD;  Location: [facility] INVASIVE CV LAB;  Service: Cardiovascular;  Laterality: N/A;   MICROLARYNGOSCOPY WITH CO2 LASER AND EXCISION OF VOCAL CORD LESION N/A 11/23/2016   Procedure: MICROLARYNGOSCOPY  AND EXCISION OF VOCAL CORD LESION;  Surgeon: Norleen Notice, MD;  Location: [facility] OR;  Service: ENT;  Laterality: N/A;   MICROLARYNGOSCOPY WITH CO2 LASER AND EXCISION OF VOCAL CORD LESION N/A 01/11/2017   Procedure: MICROLARYNGOSCOPY WITH CO2 LASER AND EXCISION OF VOCAL CORD LESION;  Surgeon: Norleen Notice, MD;  Location: [facility] OR;  Service: ENT;  Laterality: N/A;   MICROLARYNGOSCOPY WITH LASER N/A 03/16/2015   Procedure: MICROLARYNGOSCOPY ;  Surgeon: Norleen Notice, MD;  Location: [facility] OR;  Service: ENT;  Laterality: N/A;   peyronie's surgery     SHOULDER SURGERY Right    rotator cuff   SPINE SURGERY  09/2020   cervical surgery. steel plate, bone spurs, allograft.   THROAT SURGERY  1997   cancer removed  Current Outpatient Medications  Medication Sig Dispense Refill   doxycycline  (VIBRA -TABS) 100 MG tablet Take 1 tablet (100 mg total) by mouth 2 (two) times daily. 20 tablet 0   ALPRAZolam  (XANAX ) 0.25 MG tablet Take 1 tablet by mouth twice daily as needed for anxiety 60 tablet 0   arformoterol  (BROVANA ) 15 MCG/2ML NEBU Take 2 mLs (15 mcg total) by nebulization 2 (two) times daily. 120 mL 6   aspirin  EC 81 MG tablet Take 1 tablet (81 mg total) by mouth daily. Swallow whole. 150 tablet 2   budesonide  (PULMICORT ) 0.25 MG/2ML nebulizer solution Take 2 mLs (0.25 mg total) by nebulization in the morning and at bedtime. 60 mL 11   busPIRone  (BUSPAR ) 7.5 MG tablet Take 1 tablet by mouth twice daily 60 tablet 5   clopidogrel  (PLAVIX ) 75 MG tablet Take 1 tablet by mouth once daily 90 tablet 3   Continuous Glucose Sensor (DEXCOM G7 SENSOR) MISC Every 10 days 9 each 3   cyanocobalamin  (VITAMIN B12) 1000 MCG/ML injection INJECT 1 ML (CC) INTRAMUSCULARLY ONCE A WEEK 10 mL 0   cyclobenzaprine   (FLEXERIL ) 10 MG tablet Take 1 tablet by mouth three times daily as needed for muscle spasm 90 tablet 3   dapagliflozin  propanediol (FARXIGA ) 10 MG TABS tablet Take 1 tablet (10 mg total) by mouth daily. 90 tablet 0   doxycycline  (VIBRA -TABS) 100 MG tablet Take 1 tablet (100 mg total) by mouth 2 (two) times daily. 20 tablet 0   DULoxetine  (CYMBALTA ) 60 MG capsule Take 1 capsule by mouth twice daily 60 capsule 5   Dupilumab  (DUPIXENT ) 300 MG/2ML SOAJ Inject 300 mg into the skin every 14 (fourteen) days. ** PLEASE SCHEDULE FOLLOW-UP FOR FUTURE REFILLS ** 4 mL 1   finasteride  (PROSCAR ) 5 MG tablet Take 1 tablet by mouth once daily 90 tablet 0   gabapentin  (NEURONTIN ) 400 MG capsule TAKE 1 CAPSULE BY MOUTH THREE TIMES DAILY 270 capsule 0   insulin  lispro (HUMALOG ) 100 UNIT/ML injection Inject 0.04 mLs (4 Units total) into the skin 3 (three) times daily with meals. (Patient taking differently: Inject 5 Units into the skin 3 (three) times daily as needed for high blood sugar (higher than 400 give 5 units).) 10 mL 11   Insulin  Pen Needle (PEN NEEDLES) 31G X 8 MM MISC Inject insulin  as directed 100 each 1   ipratropium-albuterol  (DUONEB) 0.5-2.5 (3) MG/3ML SOLN USE 1 AMPULE IN NEBULIZER 4 TIMES DAILY 360 mL 0   Lancets (ONETOUCH DELICA PLUS LANCET33G) MISC USE 1  TO CHECK GLUCOSE THREE TIMES DAILY (Patient taking differently: 1 each by Other route 3 (three) times daily.) 100 each 0   levofloxacin  (LEVAQUIN ) 750 MG tablet Take 1 tablet (750 mg total) by mouth daily. 7 tablet 0   levothyroxine  (SYNTHROID ) 25 MCG tablet Take 1 tablet by mouth once daily 90 tablet 0   Magnesium  Chloride (MAG64) 64 MG TBEC Take 1 tablet (64 mg total) by mouth daily. 90 tablet 1   metoprolol  succinate (TOPROL -XL) 100 MG 24 hr tablet Take 1 tablet (100 mg total) by mouth daily. 90 tablet 3   nitroGLYCERIN  (NITROSTAT ) 0.4 MG SL tablet Place 1 tablet (0.4 mg total) under the tongue every 5 (five) minutes as needed for chest pain.  DISSOLVE ONE TABLET UNDER THE TONGUE EVERY 5 TO 10 MINUTES PRIOR TO ACTIVITIES WHICH MIGHT PRECIPITATE AN ATTACK 25 tablet 0   omega-3 acid ethyl esters (LOVAZA ) 1 g capsule Take 2 capsules (2 g total) by mouth  2 (two) times daily. 360 capsule 1   omeprazole  (PRILOSEC) 20 MG capsule Take 20 mg by mouth daily.     ONETOUCH ULTRA test strip USE 1 STRIP TO CHECK GLUCOSE IN THE MORNING AND 1 AT BEDTIME 100 each 0   oxyCODONE -acetaminophen  (PERCOCET/ROXICET) 5-325 MG tablet Take 1 tablet by mouth 2 (two) times daily as needed for severe pain (pain score 7-10). 60 tablet 0   [START ON 11/04/2024] oxyCODONE -acetaminophen  (PERCOCET/ROXICET) 5-325 MG tablet Take 1 tablet by mouth 2 (two) times daily as needed for severe pain (pain score 7-10). 60 tablet 0   pantoprazole  (PROTONIX ) 40 MG tablet Take 1 tablet by mouth once daily 90 tablet 0   predniSONE  (DELTASONE ) 10 MG tablet Take 1 tablet by mouth once daily with breakfast 30 tablet 5   RELION INSULIN  SYRINGE 31G X 15/64 0.3 ML MISC AS DIRECTED 100 each 0   revefenacin  (YUPELRI ) 175 MCG/3ML nebulizer solution Take 3 mLs (175 mcg total) by nebulization daily. 90 mL 11   rosuvastatin  (CRESTOR ) 20 MG tablet Take 1 tablet (20 mg total) by mouth daily. 90 tablet 3   sacubitril -valsartan  (ENTRESTO ) 24-26 MG Take 1 tablet by mouth 2 (two) times daily. Take this medication once daily for 1 week, then if Blood pressure is > 100 increase to two times daily. 180 tablet 3   spironolactone  (ALDACTONE ) 25 MG tablet Take 1 tablet by mouth once daily 90 tablet 1   tamsulosin  (FLOMAX ) 0.4 MG CAPS capsule TAKE 2 CAPSULES BY MOUTH ONCE DAILY AFTER SUPPER 180 capsule 0   torsemide  (DEMADEX ) 20 MG tablet Take 1 tablet (20 mg total) by mouth daily as needed (fluid or edema). 90 tablet 3   TOUJEO  SOLOSTAR 300 UNIT/ML Solostar Pen Inject 40 Units into the skin daily. Inject 40 units daily, increase by 2 units every 3 days until sugars are constantly 140 or lower. Max dose of 60  units daily. 18 mL 0   traZODone  (DESYREL ) 150 MG tablet Take 1 tablet by mouth once daily 270 tablet 0   VENTOLIN  HFA 108 (90 Base) MCG/ACT inhaler INHALE 2 PUFFS BY MOUTH EVERY 6 HOURS AS NEEDED FOR SHORTNESS OF BREATH FOR WHEEZING (Patient taking differently: Inhale 2 puffs into the lungs every 6 (six) hours as needed for shortness of breath or wheezing.) 18 g 0   Vitamin D , Ergocalciferol , (DRISDOL ) 1.25 MG (50000 UNIT) CAPS capsule TAKE 1 CAPSULE BY MOUTH TWICE A WEEK 12 capsule 0   Current Facility-Administered Medications  Medication Dose Route Frequency Provider Last Rate Last Admin   ipratropium-albuterol  (DUONEB) 0.5-2.5 (3) MG/3ML nebulizer solution 3 mL  3 mL Nebulization Once         Allergies as of 11/03/2024 - Review Complete 10/13/2024  Allergen Reaction Noted   Penicillins Hives, Rash, and Dermatitis 01/22/2003   Bactrim  [sulfamethoxazole -trimethoprim ]  01/18/2023   Penicillin g Rash 01/22/2003   Tramadol Rash 08/15/2006    Family History  Problem Relation Age of Onset   Cancer Mother        bone   Hypertension Mother    Stroke Father    Hypertension Father        Stroke   Healthy Child    Cancer Other        brain   Diabetes Other     Social History   Socioeconomic History   Marital status: Married    Spouse name: Not on file   Number of children: 4   Years of education: Not  on file   Highest education level: GED or equivalent  Occupational History   Occupation: retired  Tobacco Use   Smoking status: Every Day    Current packs/day: 1.00    Average packs/day: 1 pack/day for 50.0 years (50.0 ttl pk-yrs)    Types: Cigarettes   Smokeless tobacco: Never   Tobacco comments:    down to .5 ppd  11/02/2022 hfb  Vaping Use   Vaping status: Former  Substance and Sexual Activity   Alcohol use: Not on file   Drug use: No   Sexual activity: Not on file  Other Topics Concern   Not on file  Social History Narrative   Lives with wife       One level       Right hand   Social Drivers of Health   Tobacco Use: High Risk (10/13/2024)   Patient History    Smoking Tobacco Use: Every Day    Smokeless Tobacco Use: Never    Passive Exposure: Not on file  Financial Resource Strain: Low Risk (10/06/2024)   Overall Financial Resource Strain (CARDIA)    Difficulty of Paying Living Expenses: Not hard at all  Food Insecurity: No Food Insecurity (10/06/2024)   Epic    Worried About Radiation Protection Practitioner of Food in the Last Year: Never true    Ran Out of Food in the Last Year: Never true  Transportation Needs: No Transportation Needs (10/06/2024)   Epic    Lack of Transportation (Medical): No    Lack of Transportation (Non-Medical): No  Physical Activity: Inactive (10/06/2024)   Exercise Vital Sign    Days of Exercise per Week: 0 days    Minutes of Exercise per Session: Not on file  Stress: No Stress Concern Present (10/06/2024)   [employer] of Occupational Health - Occupational Stress Questionnaire    Feeling of Stress: Not at all  Social Connections: Socially Isolated (10/06/2024)   Social Connection and Isolation Panel    Frequency of Communication with Friends and Family: Once a week    Frequency of Social Gatherings with Friends and Family: Once a week    Attends Religious Services: Never    Database Administrator or Organizations: No    Attends Engineer, Structural: Not on file    Marital Status: Married  Catering Manager Violence: Not At Risk (07/30/2024)   Epic    Fear of Current or Ex-Partner: No    Emotionally Abused: No    Physically Abused: No    Sexually Abused: No  Depression (PHQ2-9): Low Risk (07/30/2024)   Depression (PHQ2-9)    PHQ-2 Score: 0  Alcohol Screen: Low Risk (10/22/2023)   Alcohol Screen    Last Alcohol Screening Score (AUDIT): 0  Housing: Unknown (10/06/2024)   Epic    Unable to Pay for Housing in the Last Year: No    Number of Times Moved in the Last Year: Not on file    Homeless in the Last Year:  No  Utilities: Not At Risk (07/30/2024)   Epic    Threatened with loss of utilities: No  Health Literacy: Adequate Health Literacy (05/31/2023)   B1300 Health Literacy    Frequency of need for help with medical instructions: Never    Review of Systems:    Constitutional: No weight loss, fever, chills, weakness or fatigue HEENT: Eyes: No change in vision               Ears, Nose, Throat:  No change  in hearing or congestion Skin: No rash or itching Cardiovascular: No chest pain, chest pressure or palpitations   Respiratory: No SOB or cough Gastrointestinal: See HPI and otherwise negative Genitourinary: No dysuria or change in urinary frequency Neurological: No headache, dizziness or syncope Musculoskeletal: No new muscle or joint pain Hematologic: No bleeding or bruising Psychiatric: No history of depression or anxiety    Physical Exam:  Vital signs: There were no vitals taken for this visit.  Constitutional: NAD, alert and cooperative Head:  Normocephalic and atraumatic. Eyes:   PEERL, EOMI. No icterus. Conjunctiva pink. Respiratory: Respirations even and unlabored. Lungs clear to auscultation bilaterally.   No wheezes, crackles, or rhonchi.  Cardiovascular:  Regular rate and rhythm. No peripheral edema, cyanosis or pallor.  Gastrointestinal:  Soft, nondistended, nontender. No rebound or guarding. Normal bowel sounds. No appreciable masses or hepatomegaly. Rectal:  Declines Msk:  Symmetrical without gross deformities. Without edema, no deformity or joint abnormality.  Neurologic:  Alert and  oriented x4;  grossly normal neurologically.  Skin:   Dry and intact without significant lesions or rashes. Psychiatric: Oriented to person, place and time. Demonstrates good judgement and reason without abnormal affect or behaviors.  Physical Exam    RELEVANT LABS AND IMAGING: CBC    Component Value Date/Time   WBC 15.5 (H) 07/30/2024 1053   WBC 11.8 (H) 01/04/2023 1208   RBC 4.64  07/30/2024 1053   RBC 4.83 01/04/2023 1208   HGB 14.1 07/30/2024 1053   HCT 44.0 07/30/2024 1053   PLT 206 07/30/2024 1053   MCV 95 07/30/2024 1053   MCV 75 (A) 08/25/2021 0000   MCH 30.4 07/30/2024 1053   MCH 26.7 11/09/2022 1714   MCHC 32.0 07/30/2024 1053   MCHC 33.1 01/04/2023 1208   RDW 13.8 07/30/2024 1053   LYMPHSABS 1.5 07/30/2024 1053   MONOABS 1.1 (H) 01/04/2023 1208   EOSABS 0.1 07/30/2024 1053   BASOSABS 0.1 07/30/2024 1053    CMP     Component Value Date/Time   NA 137 07/30/2024 1053   K 4.0 07/30/2024 1053   CL 95 (L) 07/30/2024 1053   CO2 27 07/30/2024 1053   GLUCOSE 363 (H) 07/30/2024 1053   GLUCOSE 312 (H) 05/27/2024 1318   BUN 11 07/30/2024 1053   CREATININE 0.97 07/30/2024 1053   CALCIUM  8.7 07/30/2024 1053   CALCIUM  8.7 11/14/2023 0000   PROT 5.6 (L) 07/30/2024 1053   ALBUMIN 3.6 (L) 07/30/2024 1053   AST 14 07/30/2024 1053   ALT 15 07/30/2024 1053   ALKPHOS 69 07/30/2024 1053   BILITOT 0.5 07/30/2024 1053   GFRNONAA >60 11/10/2022 1400   GFRAA 100 12/16/2020 0913     Assessment/Plan:     CAD RBBB Frequent PVCs PAD Remote PCI intervention to right common iliac artery 2016, on plavix    COPD Stable severe   Persephone Schriever Mollie RIGGERS [facility] 11/03/2024, 1:09 PM  Cc: Sherre Clapper, MD "

## 2024-11-03 NOTE — Assessment & Plan Note (Deleted)
 Don [name]

## 2024-11-04 ENCOUNTER — Ambulatory Visit: Admitting: Family Medicine

## 2024-11-04 ENCOUNTER — Other Ambulatory Visit: Payer: Self-pay | Admitting: Family Medicine

## 2024-11-04 ENCOUNTER — Encounter: Payer: Self-pay | Admitting: Family Medicine

## 2024-11-04 DIAGNOSIS — J418 Mixed simple and mucopurulent chronic bronchitis: Secondary | ICD-10-CM

## 2024-11-04 DIAGNOSIS — G894 Chronic pain syndrome: Secondary | ICD-10-CM

## 2024-11-04 DIAGNOSIS — E1142 Type 2 diabetes mellitus with diabetic polyneuropathy: Secondary | ICD-10-CM

## 2024-11-04 DIAGNOSIS — E782 Mixed hyperlipidemia: Secondary | ICD-10-CM

## 2024-11-04 MED ORDER — PROMETHAZINE HCL 25 MG PO TABS: 25.0000 mg | ORAL_TABLET | Freq: Four times a day (QID) | ORAL | 2 refills | Status: DC | PRN

## 2024-11-04 NOTE — Progress Notes (Unsigned)
 proem

## 2024-11-05 ENCOUNTER — Other Ambulatory Visit: Payer: Self-pay

## 2024-11-05 ENCOUNTER — Other Ambulatory Visit: Payer: Self-pay | Admitting: Family Medicine

## 2024-11-05 DIAGNOSIS — K219 Gastro-esophageal reflux disease without esophagitis: Secondary | ICD-10-CM

## 2024-11-05 DIAGNOSIS — R1312 Dysphagia, oropharyngeal phase: Secondary | ICD-10-CM

## 2024-11-05 MED ORDER — OXYCODONE-ACETAMINOPHEN 5-325 MG PO TABS: 1.0000 | ORAL_TABLET | Freq: Two times a day (BID) | ORAL | 0 refills | Status: DC | PRN

## 2024-11-07 ENCOUNTER — Other Ambulatory Visit: Payer: Self-pay | Admitting: Family Medicine

## 2024-11-11 ENCOUNTER — Ambulatory Visit

## 2024-11-11 ENCOUNTER — Encounter: Payer: Self-pay | Admitting: Family Medicine

## 2024-11-11 NOTE — Telephone Encounter (Signed)
 Left message for patient to return call.

## 2024-11-12 ENCOUNTER — Ambulatory Visit: Admitting: Family Medicine

## 2024-11-13 ENCOUNTER — Encounter: Payer: Self-pay | Admitting: Family Medicine

## 2024-11-13 DIAGNOSIS — I509 Heart failure, unspecified: Secondary | ICD-10-CM | POA: Diagnosis not present

## 2024-11-14 ENCOUNTER — Encounter: Payer: Self-pay | Admitting: Cardiology

## 2024-11-17 ENCOUNTER — Encounter: Payer: Self-pay | Admitting: Family Medicine

## 2024-11-17 ENCOUNTER — Ambulatory Visit: Admitting: Surgery

## 2024-11-17 ENCOUNTER — Telehealth: Payer: Self-pay

## 2024-11-17 DIAGNOSIS — J9611 Chronic respiratory failure with hypoxia: Secondary | ICD-10-CM

## 2024-11-17 NOTE — Transitions of Care (Post Inpatient/ED Visit) (Signed)
" ° °  11/17/2024  Name: Don [name] MRN: [id number] DOB: 1951-07-01  Today's TOC FU Call Status: Today's TOC FU Call Status:: Unsuccessful Call (1st Attempt) Unsuccessful Call (1st Attempt) Date: 11/17/24  Attempted to reach the patient regarding the most recent Inpatient/ED visit.  Follow Up Plan: Additional outreach attempts will be made to reach the patient to complete the Transitions of Care (Post Inpatient/ED visit) call.   Alan Ee, RN, BSN, CEN Population Health- Transition of Care Team.  [facility] [phone]  "

## 2024-11-18 ENCOUNTER — Telehealth: Payer: Self-pay | Admitting: Family Medicine

## 2024-11-18 ENCOUNTER — Encounter: Payer: Self-pay | Admitting: Cardiology

## 2024-11-18 NOTE — Telephone Encounter (Signed)
 FYI....  Copied from CRM [id number]. Topic: General - Other >> Nov 18, 2024  2:39 PM Wess RAMAN wrote: Reason for CRM: Daphne from [facility] stated they did not receive any chart notes on patient's behalf for a  portable concentrator.  Callback #: [phone] Fax #: [phone]

## 2024-11-18 NOTE — Progress Notes (Deleted)
 " Cardiology Office Note:    Date:  11/18/2024   ID:  Don [name], DOB Mar 16, 1951, MRN [id number]  PCP:  Sherre Clapper, MD  Cardiologist:  Redell Leiter, MD    Referring MD: Sherre Clapper, MD    ASSESSMENT:    No diagnosis found. PLAN:    In order of problems listed above:  ***   Next appointment: ***   Medication Adjustments/Labs and Tests Ordered: Current medicines are reviewed at length with the patient today.  Concerns regarding medicines are outlined above.  No orders of the defined types were placed in this encounter.  No orders of the defined types were placed in this encounter.    History of Present Illness:    Don [name] is a 74 y.o. male with a hx of frequent PVCs right bundle branch block and left anterior hemiblock previous LV dysfunction subsequently normalizing on follow-up echocardiogram remote PCI of the right coronary artery PAD with PCI and stent left common iliac artery and subsequent PCI and stent right common iliac artery hypertensive heart disease hyperlipidemia emphysema type 2 diabetes diabetic polyneuropathy.  We attempted to give him antiarrhythmic drugs he was intolerant of amiodarone  and mexiletine and actually was seen at [facility] for consideration of PVC ablation because of the severity of his lung disease he was not felt to be a interventional candidate.  Last seen 09/30/2024.  I fielded a call yesterday from his wife about rapid heartbeat during hospitalization with systemic inflammatory response syndrome and severe COPD he relates that at 1 point he had brief rapid atrial fibrillation otherwise sinus tachycardia.  He was advised to follow-up with his primary care doctor posthospital.  He was discharged [facility] 11/16/2024 with acute on chronic respiratory failure severe systemic inflammatory response syndrome influenza A atrial fibrillation with rapid rate delirium and metabolic encephalopathy.  He has background history of CAD with  previous PCI heart failure he required intense therapy with BiPAP Tamiflu bronchodilators steroids antibiotics and transiently received Cardizem for his atrial fibrillation Compliance with diet, lifestyle and medications: *** Past Medical History:  Diagnosis Date   Abdominal pain 06/03/2024   Abnormal chest CT 02/01/2024   Absence of bladder continence 01/08/2022   Acquired hypothyroidism 03/02/2024   Acute infection of nasal sinus 01/13/2023   Acute non-recurrent maxillary sinusitis 01/13/2023   Allergy 1989   Anxiety state 02/02/2022   Arthritis    Asymptomatic LV dysfunction 10/18/2017   Atherosclerosis of aorta 01/13/2023   B12 deficiency anemia 08/25/2021   Balance problem 07/01/2024   Basal cell carcinoma    Benign prostatic hyperplasia with incomplete bladder emptying 01/14/2019   Blood in ear canal, right 06/03/2024   Cancer (HCC) 1997   throat - 1997, throat - 2018   Cardiomyopathy, secondary (HCC) 12/01/2016   Overview:  EF 47% 12/26/16   CHF (congestive heart failure) (HCC) 10/2002   Chronic coronary artery disease 10/18/2017   Chronic cough 12/16/2023   Chronic ischemic heart disease 11/13/1998   Jan 22, 2003 Entered By: CHRISTOPHER OLIVE J Comment:  massive Mi in 2000 per patient hsitory, 2nd Mi in 2001Mar 11, 2004 Entered By: CHRISTOPHER OLIVE J Comment: Had stent placedin 2000,cath/angio in 2001   Chronic kidney disease    Chronic neck pain 05/16/2022   Chronic pain syndrome 06/04/2022   Chronic respiratory failure with hypoxia (HCC) 05/16/2022   COPD (chronic obstructive pulmonary disease) (HCC)    COPD exacerbation (HCC) 07/30/2023   Coronary artery disease involving native coronary artery of native heart  with angina pectoris 12/01/2016   Overview:  He has hx of IWMI in remote past, last cath in 2012 at [facility] showed chronic total occlusion of previously stented RCA, with good collaterals, a 40% LAD stenosis, inferior hypokinesis and EF 45%.    He's been lost to Cardiology  f/u since 2015, but has not had any recurrent events   Deficiency anemia 08/25/2021   Diabetic polyneuropathy associated with type 2 diabetes mellitus (HCC) 05/16/2022   Dyslipidemia 12/01/2016   Dysuria 04/29/2024   Emphysema of lung (HCC)    Encounter for Medicare annual wellness exam 01/13/2023   Erectile dysfunction due to diseases classified elsewhere 06/12/2019   GAD (generalized anxiety disorder) 02/02/2022   GERD (gastroesophageal reflux disease)    Hallucination 10/08/2022   History of recent fall 06/03/2024   Hyperlipidemia    Hypertension    Hypertensive heart disease with heart failure (HCC)    Insomnia 01/28/2021   Intercostal pain 04/29/2024   Iron  deficiency 07/07/2023   Iron  deficiency anemia due to chronic blood loss 08/25/2021   Lesion of vocal cord    Leukoplakia of larynx 11/15/2016   Long term (current) use of systemic steroids 12/16/2023   Long-term use of aspirin  therapy 10/02/2018   Lumbar back pain 06/04/2022   Lung nodule 11/30/2023   Lung nodules 08/17/2022   Macular degeneration of both eyes 03/02/2024   Malfunction of penile prosthesis 01/14/2019   Malignant neoplasm of skin 01/28/2021   Formatting of this note might be different from the original. Jan 22, 2003 Entered By: CHRISTOPHER OLIVE J Comment: of skin - removed x2 inpast Jan 22, 2003 Entered By: CHRISTOPHER OLIVE J Comment: of skin - removed x2 inpast   Melanoma of back (HCC)    melanoma on back   Memory loss 03/08/2020   Mixed simple and mucopurulent chronic bronchitis (HCC) 03/02/2024   Moderate recurrent major depression (HCC) 03/02/2023   Mucopurulent chronic bronchitis (HCC) 04/08/2023   Myocardial infarction (HCC) 2000   Oropharyngeal dysphagia 08/12/2018   Other ill-defined and unknown causes of morbidity and mortality 11/13/1993   Formatting of this note might be different from the original. Jan 31, 2008 Entered By: ARMOUR,ROSS B Comment: lumbar, 8/08 Jan 22, 2003 Entered By: CHRISTOPHER OLIVE J  Comment: x47yrs in work place  quit 34yrs ago in 2000 Jan 22, 2003 Entered By: CHRISTOPHER OLIVE J Comment: x9yrs in work place  quit 11yrs ago in 2000 Jan 31, 2008 Entered By: MEMORY GULL B Comment: lumbar, 8/08   Other microscopic hematuria 04/29/2024   Oxygen  deficiency    PAD (peripheral artery disease) 10/18/2017   Palpitations 04/29/2024   Peripheral vascular disease    iliac artery clot   Peyronie's disease 06/12/2019   PVC (premature ventricular contraction) 10/09/2023   RBBB 10/09/2023   Sleep apnea    Steroid myopathy 02/01/2024   Stroke (HCC)    Tia they found on MRI   Tobacco use disorder 06/12/2019   Type 2 diabetes mellitus with hyperglycemia, with long-term current use of insulin  (HCC) 07/30/2023   URI, acute 05/31/2023   Urinary urgency 04/08/2023   Ventricular ectopy 11/09/2022   Vertigo 04/08/2023   Vitamin D  deficiency disease 01/13/2023    Current Medications: Active Medications[1]    EKGs/Labs/Other Studies Reviewed:    The following studies were reviewed today:  Cardiac Studies & Procedures   ______________________________________________________________________________________________   STRESS TESTS  MYOCARDIAL PERFUSION IMAGING 05/24/2022  Interpretation Summary   The study is normal. The study is intermediate risk.   LV perfusion is  abnormal. There is no evidence of ischemia. There is evidence of infarction. Defect 1: There is a medium defect with moderate reduction in uptake present in the inferior location(s) that is fixed. There is abnormal wall motion in the defect area. Consistent with infarction.   Left ventricular function is abnormal. Global function is mildly reduced. Nuclear stress EF: 49 %. The left ventricular ejection fraction is mildly decreased (45-54%). End diastolic cavity size is normal.   Prior study not available for comparison.   ECHOCARDIOGRAM  ECHOCARDIOGRAM COMPLETE 07/24/2023  Narrative ECHOCARDIOGRAM REPORT    Patient  Name:   Don [name] Date of Exam: 07/24/2023 Medical Rec #:  [id number]          Height:       70.0 in Accession #:    [id number]         Weight:       197.0 lb Date of Birth:  [birth year]          BSA:          2.074 m Patient Age:    74 years           BP:           124/56 mmHg Patient Gender: M                  HR:           72 bpm. Exam Location:  [facility]  Procedure: 2D Echo, Cardiac Doppler, Color Doppler and Intracardiac Opacification Agent  Indications:    Frequent PVCs [I49.3 (ICD-10-CM)]; RBBB (right bundle branch block with left anterior fascicular block) [I45.2 (ICD-10-CM)]; Coronary artery disease involving native coronary artery of native heart without angina pectoris [I25.10 (ICD-10-CM)]; Essential hypertension [I10 (ICD-10-CM)]; Mixed hyperlipidemia [E78.2 (ICD-10-CM)]  History:        Patient has prior history of Echocardiogram examinations, most recent 07/25/2022.  Sonographer:    Lynwood Silvas RDCS Referring Phys: [id number] [name]   Sonographer Comments: Technically challenging study due to limited acoustic windows. Global longitudinal strain was attempted. IMPRESSIONS   1. Left ventricular ejection fraction, by estimation, is 50 to 55%. The left ventricle has low normal function. The left ventricle demonstrates global hypokinesis. Left ventricular diastolic parameters are indeterminate. 2. Right ventricular systolic function is normal. The right ventricular size is normal. There is normal pulmonary artery systolic pressure. 3. Trace to small pericardial effusion anteriorlly. There is no evidence of cardiac tamponade. 4. The mitral valve was not well visualized. Trivial mitral valve regurgitation. No evidence of mitral stenosis. 5. The aortic valve is tricuspid. Aortic valve regurgitation is not visualized. No aortic stenosis is present. 6. There is borderline dilatation of the ascending aorta, measuring 36 mm. 7. The inferior vena cava is normal in size  with greater than 50% respiratory variability, suggesting right atrial pressure of 3 mmHg.  Comparison(s): A prior study was performed on 06/03/2021. LVEF was 55-60% and small pericardial effusion and fat pad reported.  FINDINGS Left Ventricle: Left ventricular ejection fraction, by estimation, is 50 to 55%. The left ventricle has low normal function. The left ventricle demonstrates global hypokinesis. Definity  contrast agent was given IV to delineate the left ventricular endocardial borders. The left ventricular internal cavity size was normal in size. There is no left ventricular hypertrophy. Left ventricular diastolic parameters are indeterminate.  Right Ventricle: The right ventricular size is normal. Right vetricular wall thickness was not well visualized. Right ventricular systolic function is normal. There is normal pulmonary  artery systolic pressure. The tricuspid regurgitant velocity is 2.41 m/s, and with an assumed right atrial pressure of 3 mmHg, the estimated right ventricular systolic pressure is 26.2 mmHg.  Left Atrium: Left atrial size was not well visualized.  Right Atrium: Right atrial size was not well visualized.  Pericardium: Trivial pericardial effusion is present. The pericardial effusion is localized near the right atrium and anterior to the right ventricle. There is no evidence of cardiac tamponade. Presence of epicardial fat layer.  Mitral Valve: The mitral valve was not well visualized. Trivial mitral valve regurgitation. No evidence of mitral valve stenosis.  Tricuspid Valve: The tricuspid valve is not well visualized. Tricuspid valve regurgitation is trivial.  Aortic Valve: The aortic valve is tricuspid. Aortic valve regurgitation is not visualized. No aortic stenosis is present.  Pulmonic Valve: The pulmonic valve was not well visualized. Pulmonic valve regurgitation is not visualized.  Aorta: The aortic root is normal in size and structure. There is borderline  dilatation of the ascending aorta, measuring 36 mm.  Venous: The inferior vena cava is normal in size with greater than 50% respiratory variability, suggesting right atrial pressure of 3 mmHg.  IAS/Shunts: The interatrial septum was not well visualized.   LEFT VENTRICLE PLAX 2D LVIDd:         5.70 cm     Diastology LVIDs:         3.80 cm     LV e' medial:    5.55 cm/s LV PW:         1.10 cm     LV E/e' medial:  9.1 LV IVS:        1.10 cm     LV e' lateral:   6.64 cm/s LVOT diam:     2.10 cm     LV E/e' lateral: 7.6 LV SV:         64 LV SV Index:   31 LVOT Area:     3.46 cm  LV Volumes (MOD) LV vol d, MOD A2C: 74.9 ml LV vol d, MOD A4C: 89.2 ml LV vol s, MOD A2C: 40.0 ml LV vol s, MOD A4C: 41.8 ml LV SV MOD A2C:     34.9 ml LV SV MOD A4C:     89.2 ml LV SV MOD BP:      42.8 ml  RIGHT VENTRICLE            IVC RV Basal diam:  2.60 cm    IVC diam: 1.60 cm RV S prime:     9.90 cm/s TAPSE (M-mode): 1.6 cm  LEFT ATRIUM             Index        RIGHT ATRIUM           Index LA diam:        3.70 cm 1.78 cm/m   RA Area:     12.40 cm LA Vol (A2C):   52.8 ml 25.46 ml/m  RA Volume:   30.00 ml  14.47 ml/m LA Vol (A4C):   49.3 ml 23.77 ml/m LA Biplane Vol: 51.4 ml 24.78 ml/m AORTIC VALVE LVOT Vmax:   85.95 cm/s LVOT Vmean:  58.450 cm/s LVOT VTI:    0.184 m  AORTA Ao Root diam: 3.60 cm Ao Asc diam:  3.60 cm Ao Desc diam: 2.90 cm  MV E velocity: 50.30 cm/s   TRICUSPID VALVE MV A velocity: 105.00 cm/s  TR Peak grad:   23.2 mmHg MV E/A ratio:  0.48         TR Vmax:        241.00 cm/s  SHUNTS Systemic VTI:  0.18 m Systemic Diam: 2.10 cm  Sreedhar reddy Madireddy Electronically signed by Alean reddy Madireddy Signature Date/Time: 07/24/2023/5:30:35 PM    Final    MONITORS  LONG TERM MONITOR (3-14 DAYS) 07/18/2023  Narrative Patch Wear Time:  2 days and 10 hours  Patient had a min HR of 63 bpm, max HR of 86 bpm, and avg HR of 72 bpm. Predominant underlying  rhythm was Sinus Rhythm. 33.6% ventricular ectopy Less than 1% supraventricular ectopy Patient triggered episodes associated with sinus rhythm and PVCs  Will Camnitz, MD       ______________________________________________________________________________________________          Recent Labs: 06/03/2024: TSH 1.190 07/30/2024: ALT 15; BUN 11; Creatinine, Ser 0.97; Hemoglobin 14.1; Platelets 206; Potassium 4.0; Sodium 137  Recent Lipid Panel    Component Value Date/Time   CHOL 120 02/28/2024 1000   TRIG 517 (H) 02/28/2024 1000   HDL 27 (L) 02/28/2024 1000   CHOLHDL 4.4 02/28/2024 1000   LDLCALC 23 02/28/2024 1000    Physical Exam:    VS:  There were no vitals taken for this visit.    Wt Readings from Last 3 Encounters:  10/08/24 186 lb (84.4 kg)  09/30/24 198 lb (89.8 kg)  07/30/24 190 lb (86.2 kg)     GEN: *** Well nourished, well developed in no acute distress HEENT: Normal NECK: No JVD; No carotid bruits LYMPHATICS: No lymphadenopathy CARDIAC: ***RRR, no murmurs, rubs, gallops RESPIRATORY:  Clear to auscultation without rales, wheezing or rhonchi  ABDOMEN: Soft, non-tender, non-distended MUSCULOSKELETAL:  No edema; No deformity  SKIN: Warm and dry NEUROLOGIC:  Alert and oriented x 3 PSYCHIATRIC:  Normal affect    Signed, Redell Leiter, MD  11/18/2024 3:42 PM     [facility]     [1]  No outpatient medications have been marked as taking for the 11/19/24 encounter (Appointment) with Leiter Redell PARAS, MD.   Current Facility-Administered Medications for the 11/19/24 encounter (Appointment) with Leiter Redell PARAS, MD  Medication   ipratropium-albuterol  (DUONEB) 0.5-2.5 (3) MG/3ML nebulizer solution 3 mL   "

## 2024-11-19 ENCOUNTER — Telehealth: Payer: Self-pay

## 2024-11-19 ENCOUNTER — Ambulatory Visit: Admitting: Cardiology

## 2024-11-19 NOTE — Transitions of Care (Post Inpatient/ED Visit) (Signed)
" ° °  11/19/2024  Name: Don [name] MRN: [id number] DOB: 14-Oct-1951  Today's TOC FU Call Status: Today's TOC FU Call Status:: Unsuccessful Call (2nd Attempt) Unsuccessful Call (2nd Attempt) Date: 11/19/24  Attempted to reach the patient regarding the most recent Inpatient/ED visit.  Follow Up Plan: Additional outreach attempts will be made to reach the patient to complete the Transitions of Care (Post Inpatient/ED visit) call.   Madia Carvell J. Jurni Cesaro RN, MSN [facility], [facility] RN Care Manager Direct Dial: [phone]  Fax: [phone] Website: [email or web address]   "

## 2024-11-19 NOTE — Telephone Encounter (Signed)
 Spoke Aptos from [facility], she confirm they are [facility]. I sent over office note and patient insurance information as requested.

## 2024-11-20 ENCOUNTER — Telehealth: Payer: Self-pay

## 2024-11-20 DIAGNOSIS — I4729 Other ventricular tachycardia: Secondary | ICD-10-CM | POA: Diagnosis not present

## 2024-11-20 DIAGNOSIS — I2510 Atherosclerotic heart disease of native coronary artery without angina pectoris: Secondary | ICD-10-CM | POA: Diagnosis not present

## 2024-11-20 DIAGNOSIS — I429 Cardiomyopathy, unspecified: Secondary | ICD-10-CM | POA: Diagnosis not present

## 2024-11-20 DIAGNOSIS — I5022 Chronic systolic (congestive) heart failure: Secondary | ICD-10-CM | POA: Diagnosis not present

## 2024-11-20 DIAGNOSIS — R9431 Abnormal electrocardiogram [ECG] [EKG]: Secondary | ICD-10-CM | POA: Diagnosis not present

## 2024-11-20 DIAGNOSIS — I4510 Unspecified right bundle-branch block: Secondary | ICD-10-CM | POA: Diagnosis not present

## 2024-11-20 DIAGNOSIS — J9600 Acute respiratory failure, unspecified whether with hypoxia or hypercapnia: Secondary | ICD-10-CM | POA: Diagnosis not present

## 2024-11-20 DIAGNOSIS — I493 Ventricular premature depolarization: Secondary | ICD-10-CM | POA: Diagnosis not present

## 2024-11-20 DIAGNOSIS — J449 Chronic obstructive pulmonary disease, unspecified: Secondary | ICD-10-CM | POA: Diagnosis not present

## 2024-11-20 NOTE — Transitions of Care (Post Inpatient/ED Visit) (Signed)
" ° °  11/20/2024  Name: Don [name] MRN: [id number] DOB: [birth year]  Today's TOC FU Call Status: Today's TOC FU Call Status:: Unsuccessful Call (3rd Attempt) Unsuccessful Call (3rd Attempt) Date: 11/20/24  Attempted to reach the patient regarding the most recent Inpatient/ED visit.  Follow Up Plan: No further outreach attempts will be made at this time. We have been unable to contact the patient.  Shona Prow RN, CCM [place]  [facility] RN Care Manager [phone]  "

## 2024-11-21 DIAGNOSIS — I493 Ventricular premature depolarization: Secondary | ICD-10-CM | POA: Diagnosis not present

## 2024-11-21 DIAGNOSIS — I5022 Chronic systolic (congestive) heart failure: Secondary | ICD-10-CM

## 2024-11-21 DIAGNOSIS — I4729 Other ventricular tachycardia: Secondary | ICD-10-CM | POA: Diagnosis not present

## 2024-11-21 DIAGNOSIS — I429 Cardiomyopathy, unspecified: Secondary | ICD-10-CM | POA: Diagnosis not present

## 2024-11-21 DIAGNOSIS — I2510 Atherosclerotic heart disease of native coronary artery without angina pectoris: Secondary | ICD-10-CM | POA: Diagnosis not present

## 2024-11-22 DIAGNOSIS — I4891 Unspecified atrial fibrillation: Secondary | ICD-10-CM | POA: Diagnosis not present

## 2024-11-22 DIAGNOSIS — J441 Chronic obstructive pulmonary disease with (acute) exacerbation: Secondary | ICD-10-CM | POA: Diagnosis not present

## 2024-11-22 DIAGNOSIS — R9431 Abnormal electrocardiogram [ECG] [EKG]: Secondary | ICD-10-CM | POA: Diagnosis not present

## 2024-11-22 DIAGNOSIS — I4510 Unspecified right bundle-branch block: Secondary | ICD-10-CM | POA: Diagnosis not present

## 2024-11-22 DIAGNOSIS — J9600 Acute respiratory failure, unspecified whether with hypoxia or hypercapnia: Secondary | ICD-10-CM | POA: Diagnosis not present

## 2024-11-22 DIAGNOSIS — R000 Tachycardia, unspecified: Secondary | ICD-10-CM | POA: Diagnosis not present

## 2024-11-22 DIAGNOSIS — I10 Essential (primary) hypertension: Secondary | ICD-10-CM | POA: Diagnosis not present

## 2024-11-22 DIAGNOSIS — I2510 Atherosclerotic heart disease of native coronary artery without angina pectoris: Secondary | ICD-10-CM | POA: Diagnosis not present

## 2024-11-23 DIAGNOSIS — I4891 Unspecified atrial fibrillation: Secondary | ICD-10-CM | POA: Diagnosis not present

## 2024-11-23 DIAGNOSIS — J9600 Acute respiratory failure, unspecified whether with hypoxia or hypercapnia: Secondary | ICD-10-CM | POA: Diagnosis not present

## 2024-11-23 DIAGNOSIS — J441 Chronic obstructive pulmonary disease with (acute) exacerbation: Secondary | ICD-10-CM | POA: Diagnosis not present

## 2024-11-24 DIAGNOSIS — I493 Ventricular premature depolarization: Secondary | ICD-10-CM | POA: Diagnosis not present

## 2024-11-24 DIAGNOSIS — I2510 Atherosclerotic heart disease of native coronary artery without angina pectoris: Secondary | ICD-10-CM | POA: Diagnosis not present

## 2024-11-24 DIAGNOSIS — I4510 Unspecified right bundle-branch block: Secondary | ICD-10-CM | POA: Diagnosis not present

## 2024-11-24 DIAGNOSIS — I480 Paroxysmal atrial fibrillation: Secondary | ICD-10-CM | POA: Diagnosis not present

## 2024-11-24 DIAGNOSIS — J449 Chronic obstructive pulmonary disease, unspecified: Secondary | ICD-10-CM | POA: Diagnosis not present

## 2024-11-24 DIAGNOSIS — R9431 Abnormal electrocardiogram [ECG] [EKG]: Secondary | ICD-10-CM | POA: Diagnosis not present

## 2024-11-24 DIAGNOSIS — I4891 Unspecified atrial fibrillation: Secondary | ICD-10-CM | POA: Diagnosis not present

## 2024-11-24 DIAGNOSIS — I959 Hypotension, unspecified: Secondary | ICD-10-CM | POA: Diagnosis not present

## 2024-11-24 DIAGNOSIS — J9621 Acute and chronic respiratory failure with hypoxia: Secondary | ICD-10-CM | POA: Diagnosis not present

## 2024-11-24 DIAGNOSIS — R509 Fever, unspecified: Secondary | ICD-10-CM | POA: Diagnosis not present

## 2024-11-24 DIAGNOSIS — J101 Influenza due to other identified influenza virus with other respiratory manifestations: Secondary | ICD-10-CM | POA: Diagnosis not present

## 2024-11-25 DIAGNOSIS — J9621 Acute and chronic respiratory failure with hypoxia: Secondary | ICD-10-CM | POA: Diagnosis not present

## 2024-11-25 DIAGNOSIS — I959 Hypotension, unspecified: Secondary | ICD-10-CM | POA: Diagnosis not present

## 2024-11-25 DIAGNOSIS — R509 Fever, unspecified: Secondary | ICD-10-CM | POA: Diagnosis not present

## 2024-11-25 DIAGNOSIS — I480 Paroxysmal atrial fibrillation: Secondary | ICD-10-CM | POA: Diagnosis not present

## 2024-11-26 DIAGNOSIS — I480 Paroxysmal atrial fibrillation: Secondary | ICD-10-CM | POA: Diagnosis not present

## 2024-11-26 DIAGNOSIS — R509 Fever, unspecified: Secondary | ICD-10-CM | POA: Diagnosis not present

## 2024-11-26 DIAGNOSIS — I493 Ventricular premature depolarization: Secondary | ICD-10-CM

## 2024-11-26 DIAGNOSIS — I959 Hypotension, unspecified: Secondary | ICD-10-CM | POA: Diagnosis not present

## 2024-11-26 DIAGNOSIS — J9621 Acute and chronic respiratory failure with hypoxia: Secondary | ICD-10-CM | POA: Diagnosis not present

## 2024-11-27 ENCOUNTER — Ambulatory Visit: Admitting: Internal Medicine

## 2024-11-27 DIAGNOSIS — R509 Fever, unspecified: Secondary | ICD-10-CM | POA: Diagnosis not present

## 2024-11-27 DIAGNOSIS — I480 Paroxysmal atrial fibrillation: Secondary | ICD-10-CM | POA: Diagnosis not present

## 2024-11-27 DIAGNOSIS — J9621 Acute and chronic respiratory failure with hypoxia: Secondary | ICD-10-CM | POA: Diagnosis not present

## 2024-11-27 DIAGNOSIS — I959 Hypotension, unspecified: Secondary | ICD-10-CM | POA: Diagnosis not present

## 2024-11-28 ENCOUNTER — Telehealth: Payer: Self-pay

## 2024-11-28 NOTE — Transitions of Care (Post Inpatient/ED Visit) (Signed)
" ° °  11/28/2024  Name: Don [name] MRN: [id number] DOB: 1951/06/01  Today's TOC FU Call Status: Today's TOC FU Call Status:: Successful TOC FU Call Completed TOC FU Call Complete Date: 11/28/24 (Spoke with wife who states the hospital reviewed everything and a home health nurse is coming and does not feel she needs the calls but did ask if the 12/12/24 hospital f/u she scheduled could be moved to 12/03/24 - messagge sent to provider)  Patient's Name and Date of Birth confirmed. DOB, Name  Transition Care Management Follow-up Telephone Call Date of Discharge: 11/27/24 Discharge Facility: Other ([facility]) Name of Other ([facility]) Discharge Facility: [facility] Type of Discharge: Inpatient Admission Primary Inpatient Discharge Diagnosis:: Severe sepsis without septic shock, bacterial pneumonia  Items Reviewed: Do you have support at home?: Yes People in Home [RPT]: spouse Name of Support/Comfort Primary Source: wife, Lenward  Medications Reviewed Today: Medications Reviewed Today   Medications were not reviewed in this encounter     Home Care and Equipment/Supplies: Were Home Health Services Ordered?: Yes Name of Home Health Agency:: [facility] - wife states they have called her and she was just about to call them back   Follow up appointments reviewed: PCP Follow-up appointment confirmed?: Yes Date of PCP follow-up appointment?: 12/12/24 Follow-up Provider: currently scheduled with Dr Sherre - sent secure chat message per wife request asking if Dr Sherre could see patient sooner, possibly 12/03/24    Shona Prow RN, CCM [place]  VBCI-Population Health RN Care Manager [phone]  "

## 2024-11-28 NOTE — Telephone Encounter (Signed)
 Ana from [facility] called to verify that Dr.Cox is PCP of patient and would sign home health orders. Verbalized Provider is PCP of patient and orders can be sent to provider.   Hospital send patient home with Order for PT and nursing home health.   Copied from CRM [id number]. Topic: General - Other >> Nov 27, 2024  4:37 PM Victoria B wrote: Reason for CRM: Jenkins from [place] [facility] home health wants to know if Dr Abigail will sign off on  PT and nursing orders

## 2024-12-01 ENCOUNTER — Ambulatory Visit: Admitting: Cardiology

## 2024-12-02 ENCOUNTER — Telehealth: Payer: Self-pay

## 2024-12-02 DIAGNOSIS — J449 Chronic obstructive pulmonary disease, unspecified: Secondary | ICD-10-CM

## 2024-12-02 DIAGNOSIS — J441 Chronic obstructive pulmonary disease with (acute) exacerbation: Secondary | ICD-10-CM

## 2024-12-02 NOTE — Telephone Encounter (Signed)
 Copied from CRM [id number]. Topic: Clinical - Medication Refill >> Dec 02, 2024 11:52 AM Leila C wrote: Medication: Dupilumab  (DUPIXENT ) 300 MG/2ML SOAJ  Has the patient contacted their pharmacy? Yes. Patient's wife Don [phone] states patient was in ICU last week and was discharged last Friday and needs Dupilumab  (DUPIXENT ) 300 MG/2ML SOAJ refill,  patient is out of medication. Don states contact the pharmacy and was advised to contact the office. Patient is currently using [facility] pharmacy [phone], they can shipped it overnight. Patient has an upcoming appointment 12/22/24 at 3 pm with NP, Hope.   (Agent: If no, request that the patient contact the pharmacy for the refill. If patient does not wish to contact the pharmacy document the reason why and proceed with request.) (Agent: If yes, when and what did the pharmacy advise?)  This is the patient's preferred pharmacy:  [facility] - [place], [place] - [address] [place] [address] Phone: [phone] Fax: [phone]  Is this the correct pharmacy for this prescription? No If no, delete pharmacy and type the correct one.   Has the prescription been filled recently? No  Is the patient out of the medication? Yes  Has the patient been seen for an appointment in the last year OR does the patient have an upcoming appointment? Yes  Can we respond through MyChart? Yes  Agent: Please be advised that Rx refills may take up to 3 business days. We ask that you follow-up with your pharmacy.      Called and spoke to pt's wife, Don [name]) and advised that I would forward their refill request to specialty pharmacy team.

## 2024-12-03 ENCOUNTER — Ambulatory Visit

## 2024-12-04 ENCOUNTER — Telehealth: Payer: Self-pay

## 2024-12-04 NOTE — Telephone Encounter (Signed)
 Copied from CRM #[id number]. Topic: General - Other >> Dec 04, 2024 12:03 PM Larissa S wrote: Reason for CRM: [facility] states they have an oxygen  order for patient but they are still needing patient's oxygen  testing results and liter flow.  Callback # [phone] Fax# [phone]

## 2024-12-05 ENCOUNTER — Telehealth: Payer: Self-pay | Admitting: Family Medicine

## 2024-12-05 MED ORDER — DUPIXENT 300 MG/2ML ~~LOC~~ SOAJ: 300.0000 mg | SUBCUTANEOUS | 2 refills | Status: DC

## 2024-12-05 NOTE — Telephone Encounter (Signed)
 Spoke with wife and she stated she will send a my chart message of the day she will be able to bring patient.

## 2024-12-05 NOTE — Telephone Encounter (Signed)
 [facility] POC Approval Request

## 2024-12-05 NOTE — Telephone Encounter (Signed)
 Rx for Dupixent  triaged to [facility]. Next OV scheduled Feb 2026.

## 2024-12-06 ENCOUNTER — Other Ambulatory Visit: Payer: Self-pay

## 2024-12-06 ENCOUNTER — Encounter: Payer: Self-pay | Admitting: Family Medicine

## 2024-12-08 ENCOUNTER — Other Ambulatory Visit: Payer: Self-pay | Admitting: Family Medicine

## 2024-12-08 ENCOUNTER — Encounter: Payer: Self-pay | Admitting: Family Medicine

## 2024-12-08 ENCOUNTER — Telehealth: Payer: Self-pay

## 2024-12-08 NOTE — Telephone Encounter (Signed)
 I tried to call Laurier x 2 to give verbal approval and get more information about the oxygen  saturation. Unable to reach. I will call patient's wife.  She stated his blood pressure 113/61 at 3 am. His blood pressure has been stable. Oxygen  at 2 L was 90 and went up to 95% . He is stable. The day with low oxygen  was sleeping. He had a lot of mucus and he did deep breath and cough and his oxygen   went up.    Copied from CRM #[id number]. Topic: Clinical - Home Health Verbal Orders >> Dec 05, 2024  2:14 PM Delon DASEN wrote: Caller/Agency: Laurier with [facility] Callback Number: [phone] Service Requested: Physical Therapy Frequency: 2wk for 4 wks  and 1 wk for 4 wks Any new concerns about the patient? Yes- BP 90/60  Blood sat 86 on 2 liters  on 4 liters 90- is wife ok to increase to 4 liters as needed?

## 2024-12-08 NOTE — Telephone Encounter (Signed)
 Patient's wife was informed

## 2024-12-09 ENCOUNTER — Inpatient Hospital Stay: Admitting: Family Medicine

## 2024-12-09 ENCOUNTER — Ambulatory Visit: Admitting: Cardiology

## 2024-12-09 ENCOUNTER — Other Ambulatory Visit: Payer: Self-pay | Admitting: Family Medicine

## 2024-12-09 ENCOUNTER — Telehealth: Payer: Self-pay

## 2024-12-09 MED ORDER — NYSTATIN 100000 UNIT/GM EX POWD: 1.0000 | Freq: Three times a day (TID) | CUTANEOUS | 1 refills | Status: DC

## 2024-12-09 MED ORDER — METFORMIN HCL 500 MG PO TABS: 500.0000 mg | ORAL_TABLET | Freq: Two times a day (BID) | ORAL | 0 refills | Status: DC

## 2024-12-09 NOTE — Telephone Encounter (Signed)
 Called and spoke to [place], gave verbal for PT for Once this week due to weather, and 2x week for 3 weeks and 1xweek for 4 weeks.   Copied from CRM #[id number]. Topic: Clinical - Home Health Verbal Orders >> Dec 09, 2024  3:27 PM Travis FALCON wrote: Laurier with [facility] is calling in checking on the status of a request for verbal orders. Informed Laurier that someone had reached out to him on 12/08/24 and did not get an answer. Laurier says he saw the call and is calling back. Please follow up with Laurier

## 2024-12-09 NOTE — Telephone Encounter (Signed)
 Called and spoke with Corean from [facility] and She stated that patient's wife wanted her to call to let Dr. Sherre know how patient was since his appointment was cancelled. She states vitals are good and oxygen  94% on 2 liters oxygen , lungs do not sound wet. Patient is having trouble swallowing so they will go get thickner to put in his liquids to help. Patient does have yeast in his groin area and does have Nystatin  cream, nurse asked if you could switch from cream to powder and send to pharmacy to see if this helps.

## 2024-12-09 NOTE — Telephone Encounter (Signed)
 Copied from CRM #[id number]. Topic: Clinical - Medical Advice >> Dec 09, 2024 12:18 PM Montie POUR wrote: Reason for CRM:  Please call Corean at [phone] to discuss Mr. Cullars cancelled appointment today. She would like to talk to a nurse.

## 2024-12-10 ENCOUNTER — Telehealth: Payer: Self-pay | Admitting: Family Medicine

## 2024-12-10 ENCOUNTER — Telehealth: Payer: Self-pay

## 2024-12-11 ENCOUNTER — Telehealth: Payer: Self-pay

## 2024-12-11 NOTE — Telephone Encounter (Signed)
 Copied from CRM #[id number]. Topic: Clinical - Medical Advice >> 12-31-2024 10:29 AM Harlene ORN wrote: Reason for CRM: Leslee GLENWOOD Savory  calling in for oxygen . Needs to be tested by his PCP first in order to complete the order. Or if the patient has already had testing done, to please send the order over to [facility]  Phone: [phone] Fax: [phone] >> Dec 11, 2024 10:32 AM Emylou G wrote: Please call them back.. they had questions about his oxygen /testing.. see original message

## 2024-12-12 ENCOUNTER — Inpatient Hospital Stay: Admitting: Family Medicine

## 2024-12-12 ENCOUNTER — Other Ambulatory Visit: Payer: Self-pay | Admitting: Family Medicine

## 2024-12-14 NOTE — Telephone Encounter (Signed)
 Patient has deteriorated over the last week.  He has end stage copd, chronic respiratory failure with hypoxia. Oxygen  dependent.  Patient is not eating, drinking and his wife cannot get any of his medicines in him.  Patient is DNR.  Requesting hospice consultation.  I called a verbal consult on 2024-12-16 at 3 pm.

## 2024-12-14 NOTE — Telephone Encounter (Signed)
 Hospice sent. Dr. Sherre

## 2024-12-14 NOTE — Telephone Encounter (Signed)
 Patient has appointment 12/12/24  Copied from CRM #[id number]. Topic: Clinical - Medical Advice >> 2024/12/29 10:29 AM Don [name] wrote: Reason for CRM: Don [name]  calling in for oxygen . Needs to be tested by his PCP first in order to complete the order. Or if the patient has already had testing done, to please send the order over to [facility]  Phone: [phone] Fax: [phone]

## 2024-12-14 DEATH — deceased

## 2024-12-15 ENCOUNTER — Encounter: Payer: Self-pay | Admitting: Family Medicine

## 2024-12-15 ENCOUNTER — Ambulatory Visit: Admitting: Cardiology

## 2024-12-22 ENCOUNTER — Ambulatory Visit: Admitting: Primary Care

## 2024-12-24 ENCOUNTER — Ambulatory Visit: Admitting: Cardiology

## 2025-01-19 ENCOUNTER — Ambulatory Visit

## 2025-01-19 ENCOUNTER — Encounter (HOSPITAL_COMMUNITY)
